# Patient Record
Sex: Male | Born: 1995 | Race: White | Hispanic: No | Marital: Single | Smoking: Never smoker
Health system: Southern US, Community
[De-identification: ages and names within clinical notes are randomized; demographics above are authoritative.]

## PROBLEM LIST (undated history)

## (undated) DIAGNOSIS — S069X9A Unspecified intracranial injury with loss of consciousness of unspecified duration, initial encounter: Secondary | ICD-10-CM

## (undated) DIAGNOSIS — G40909 Epilepsy, unspecified, not intractable, without status epilepticus: Secondary | ICD-10-CM

## (undated) DIAGNOSIS — S069XAA Unspecified intracranial injury with loss of consciousness status unknown, initial encounter: Secondary | ICD-10-CM

## (undated) HISTORY — PX: GASTROSTOMY TUBE PLACEMENT: SHX655

---

## 2020-09-15 ENCOUNTER — Other Ambulatory Visit: Payer: Self-pay

## 2020-09-15 ENCOUNTER — Emergency Department: Payer: Medicaid Other

## 2020-09-15 ENCOUNTER — Emergency Department
Admission: EM | Admit: 2020-09-15 | Discharge: 2020-09-15 | Disposition: A | Discharge status: Home / Self Care | Payer: Medicaid Other | Attending: Emergency Medicine | Admitting: Emergency Medicine

## 2020-09-15 DIAGNOSIS — R569 Unspecified convulsions: Secondary | ICD-10-CM | POA: Diagnosis present

## 2020-09-15 LAB — CBC WITH DIFFERENTIAL/PLATELET
Abs Immature Granulocytes: 0.04 10*3/uL (ref 0.00–0.07)
Basophils Absolute: 0 10*3/uL (ref 0.0–0.1)
Basophils Relative: 0 %
Eosinophils Absolute: 0.1 10*3/uL (ref 0.0–0.5)
Eosinophils Relative: 1 %
HCT: 46.6 % (ref 39.0–52.0)
Hemoglobin: 15.3 g/dL (ref 13.0–17.0)
Immature Granulocytes: 0 %
Lymphocytes Relative: 12 %
Lymphs Abs: 1.1 10*3/uL (ref 0.7–4.0)
MCH: 29.9 pg (ref 26.0–34.0)
MCHC: 32.8 g/dL (ref 30.0–36.0)
MCV: 91.2 fL (ref 80.0–100.0)
Monocytes Absolute: 0.5 10*3/uL (ref 0.1–1.0)
Monocytes Relative: 6 %
Neutro Abs: 7.4 10*3/uL (ref 1.7–7.7)
Neutrophils Relative %: 81 %
Platelets: 402 10*3/uL — ABNORMAL HIGH (ref 150–400)
RBC: 5.11 MIL/uL (ref 4.22–5.81)
RDW: 12.2 % (ref 11.5–15.5)
WBC: 9.2 10*3/uL (ref 4.0–10.5)
nRBC: 0 % (ref 0.0–0.2)

## 2020-09-15 LAB — COMPREHENSIVE METABOLIC PANEL
ALT: 25 U/L (ref 0–44)
AST: 26 U/L (ref 15–41)
Albumin: 4.2 g/dL (ref 3.5–5.0)
Alkaline Phosphatase: 124 U/L (ref 38–126)
Anion gap: 14 (ref 5–15)
BUN: 12 mg/dL (ref 6–20)
CO2: 23 mmol/L (ref 22–32)
Calcium: 9.5 mg/dL (ref 8.9–10.3)
Chloride: 101 mmol/L (ref 98–111)
Creatinine, Ser: 0.94 mg/dL (ref 0.61–1.24)
GFR, Estimated: >60 mL/min (ref >60)
Glucose, Bld: 148 mg/dL — ABNORMAL HIGH (ref 70–99)
Potassium: 3.8 mmol/L (ref 3.5–5.1)
Sodium: 138 mmol/L (ref 135–145)
Total Bilirubin: 1 mg/dL (ref 0.3–1.2)
Total Protein: 7.2 g/dL (ref 6.5–8.1)

## 2020-09-15 LAB — URINALYSIS, COMPLETE (UACMP) WITH MICROSCOPIC
Bacteria, UA: NONE SEEN
Bilirubin Urine: NEGATIVE
Glucose, UA: NEGATIVE mg/dL
Hgb urine dipstick: NEGATIVE
Ketones, ur: NEGATIVE mg/dL
Leukocytes,Ua: NEGATIVE
Nitrite: NEGATIVE
Protein, ur: NEGATIVE mg/dL
Specific Gravity, Urine: 1.019 (ref 1.005–1.030)
Squamous Epithelial / HPF: NONE SEEN (ref 0–5)
pH: 6 (ref 5.0–8.0)

## 2020-09-15 LAB — APTT: aPTT: 29 seconds (ref 24–36)

## 2020-09-15 LAB — PROTIME-INR
INR: 1 (ref 0.8–1.2)
Prothrombin Time: 13.1 seconds (ref 11.4–15.2)

## 2020-09-15 LAB — LACTIC ACID, PLASMA
Lactic Acid, Venous: 5.9 mmol/L (ref 0.5–1.9)
Lactic Acid, Venous: 1.4 mmol/L (ref 0.5–1.9)

## 2020-09-15 MED ORDER — ISONIAZID 300 MG PO TABS: 300.0000 mg | ORAL_TABLET | Freq: Once | ORAL | Status: DC

## 2020-09-15 MED ORDER — LEVETIRACETAM IN NACL 1000 MG/100ML IV SOLN
1000.0000 mg | Freq: Once | INTRAVENOUS | Status: AC
  Administered 2020-09-15: 1000 mg via INTRAVENOUS
  Filled 2020-09-15: qty 100

## 2020-09-15 MED ORDER — SODIUM CHLORIDE 0.9 % IV BOLUS (SEPSIS)
1000.0000 mL | Freq: Once | INTRAVENOUS | Status: AC
  Administered 2020-09-15: 1000 mL via INTRAVENOUS

## 2020-09-15 MED ORDER — RIFAMPIN 300 MG PO CAPS: 600.0000 mg | ORAL_CAPSULE | Freq: Once | ORAL | Status: DC

## 2020-09-15 MED ORDER — PYRAZINAMIDE 500 MG PO TABS: 1000.0000 mg | ORAL_TABLET | Freq: Once | ORAL | Status: DC

## 2020-09-15 MED ORDER — IOHEXOL 300 MG/ML  SOLN
75.0000 mL | Freq: Once | INTRAMUSCULAR | Status: AC | PRN
  Administered 2020-09-15: 75 mL via INTRAVENOUS

## 2020-09-15 MED ORDER — ETHAMBUTOL HCL 400 MG PO TABS: 800.0000 mg | ORAL_TABLET | Freq: Once | ORAL | Status: DC

## 2020-09-15 MED ORDER — ACETAMINOPHEN 650 MG RE SUPP
650.0000 mg | Freq: Once | RECTAL | Status: AC
  Administered 2020-09-15: 650 mg via RECTAL
  Filled 2020-09-15: qty 1

## 2020-09-15 NOTE — ED Notes (Signed)
This RN spoke with [facility] to notify of pt discharge and POC

## 2020-09-15 NOTE — ED Triage Notes (Signed)
Pt presents to ER via [facility] for seizures.  Pt has hx of TBI, but no hx of seizures.  Pt was reportedly found by staff at facility having twitching movements and not responding.  Pt unable to answer any questions in triage.  Unknown what baseline mental status is.

## 2020-09-15 NOTE — ED Provider Notes (Signed)
[facility] Emergency Department Provider Note   ____________________________________________   First MD Initiated Contact with Patient 09/15/20 0127     (approximate)  I have reviewed the triage vital signs and the nursing notes.   HISTORY  Chief Complaint No chief complaint on file.    HPI Alex [name] is a 24 y.o. male with a past medical history of traumatic brain injury and unknown baseline mental status who presents from his long-term care facility with complaints of seizure-like activity.  Per EMS staff at the facility found patient having twitching movements and not responding.  Patient was unable to answer any questions during the triage process and is not responding to this interview at this time.         History reviewed. No pertinent past medical history.  There are no problems to display for this patient.     Prior to Admission medications   Not on File    Allergies Patient has no known allergies.  No family history on file.  Social History Social History   Tobacco Use  . Smoking status: Not on file  Substance Use Topics  . Alcohol use: Not on file  . Drug use: Not on file    Review of Systems Unable to assess ____________________________________________   PHYSICAL EXAM:  VITAL SIGNS: ED Triage Vitals  Enc Vitals Group     BP 09/15/20 0134 127/86     Pulse Rate 09/15/20 0134 (!) 145     Resp 09/15/20 0134 20     Temp 09/15/20 0134 100.3 F (37.9 C)     Temp Source 09/15/20 0134 Axillary     SpO2 09/15/20 0134 91 %     Weight 09/15/20 0136 122 lb (55.3 kg)     Height --      Head Circumference --      Peak Flow --      Pain Score --      Pain Loc --      Pain Edu? --      Excl. in GC? --    Constitutional: Awake.  Cachectic. Eyes: Conjunctivae injected bilaterally. PERRL.  Horizontal nystagmus Head: Atraumatic. Nose: No congestion/rhinnorhea. Mouth/Throat: Mucous membranes are moist.  No  intraoral trauma Neck: No stridor Cardiovascular: Grossly normal heart sounds.  Good peripheral circulation. Respiratory: Normal respiratory effort.  No retractions. Gastrointestinal: Soft and nontender. No distention.  G-tube in place to left upper quadrant Musculoskeletal: No obvious deformities Neurologic: GCS 7 ([id number]), unknown baseline.  Protecting airway Skin:  Skin is warm and dry. No rash noted. Psychiatric: Mood and affect are normal. Speech and behavior are normal.  ____________________________________________   LABS (all labs ordered are listed, but only abnormal results are displayed)  Labs Reviewed  LACTIC ACID, PLASMA - Abnormal; Notable for the following components:      Result Value   Lactic Acid, Venous 5.9 (*)    All other components within normal limits  COMPREHENSIVE METABOLIC PANEL - Abnormal; Notable for the following components:   Glucose, Bld 148 (*)    All other components within normal limits  CBC WITH DIFFERENTIAL/PLATELET - Abnormal; Notable for the following components:   Platelets 402 (*)    All other components within normal limits  URINALYSIS, COMPLETE (UACMP) WITH MICROSCOPIC - Abnormal; Notable for the following components:   Color, Urine YELLOW (*)    APPearance CLOUDY (*)    All other components within normal limits  URINE CULTURE  CULTURE, BLOOD (ROUTINE  X 2)  CULTURE, BLOOD (ROUTINE X 2)  LACTIC ACID, PLASMA  PROTIME-INR  APTT   ____________________________________________  EKG  ED ECG REPORT I, Merwyn Katos, the attending physician, personally viewed and interpreted this ECG.  Date: 09/15/2020 EKG Time: 0137 Rate: 144 Rhythm: Tachycardic sinus rhythm QRS Axis: normal Intervals: normal ST/T Wave abnormalities: normal Narrative Interpretation: no evidence of acute ischemia  ____________________________________________  RADIOLOGY  ED MD interpretation: Single view portable x-ray of the chest does not show any evidence of  acute abnormalities including no obvious pneumonia, pneumothorax, or widened mediastinum  Official radiology report(s): CT Head Wo Contrast  Result Date: 09/15/2020 CLINICAL DATA:  Nontraumatic seizures. History of traumatic brain injury. EXAM: CT HEAD WITHOUT CONTRAST TECHNIQUE: Contiguous axial images were obtained from the base of the skull through the vertex without intravenous contrast. COMPARISON:  None. FINDINGS: Brain: Large areas of encephalomalacia centered in the temporal lobes bilaterally and extending to the inferior frontal and parietal lobes. Moderate ventricular dilatation. Parents is are unchanged since prior study. No mass-effect or midline shift. No abnormal extra-axial fluid collections. Basal cisterns are not effaced. No evidence of acute intracranial hemorrhage. Vascular: No hyperdense vessel or unexpected calcification. Skull: Normal. Negative for fracture or focal lesion. Sinuses/Orbits: Mucosal thickening throughout the paranasal sinuses. No acute air-fluid levels. Mastoid air cells are clear. Other: None. IMPRESSION: 1. No acute intracranial abnormalities. 2. Bilateral temporal and frontoparietal encephalomalacia, unchanged since prior study. 3. Moderate ventricular dilatation is unchanged since prior study. This may be due to central atrophy or normal pressure hydrocephalus. 4. Chronic sinusitis. Electronically Signed   By: Burman Nieves M.D.   On: 09/15/2020 05:46   CT Chest W Contrast  Result Date: 09/15/2020 CLINICAL DATA:  Seizures. EXAM: CT CHEST WITH CONTRAST TECHNIQUE: Multidetector CT imaging of the chest was performed during intravenous contrast administration. CONTRAST:  19mL OMNIPAQUE IOHEXOL 300 MG/ML  SOLN COMPARISON:  Chest radiograph 09/15/2020 FINDINGS: Cardiovascular: Normal heart size. No pericardial effusions. Normal caliber thoracic aorta. No dissection. Great vessel origins are patent. Central pulmonary arteries demonstrate no significant pulmonary  embolus. Mediastinum/Nodes: No enlarged mediastinal, hilar, or axillary lymph nodes. Thyroid gland, trachea, and esophagus demonstrate no significant findings. Lungs/Pleura: Lungs are clear. No pleural effusion or pneumothorax. Upper Abdomen: Gastrostomy tube is present. No acute process demonstrated in the visualized upper abdomen. Musculoskeletal: No chest wall abnormality. No acute or significant osseous findings. IMPRESSION: 1. No evidence of active pulmonary disease. 2. Gastrostomy tube is present. Electronically Signed   By: Burman Nieves M.D.   On: 09/15/2020 06:42   DG Chest Port 1 View  Result Date: 09/15/2020 CLINICAL DATA:  Seizures. EXAM: PORTABLE CHEST 1 VIEW COMPARISON:  None. FINDINGS: The heart size and mediastinal contours are within normal limits. Both lungs are clear. The visualized skeletal structures are unremarkable. IMPRESSION: No active disease. Electronically Signed   By: Burman Nieves M.D.   On: 09/15/2020 01:59    ____________________________________________   PROCEDURES  Procedure(s) performed (including Critical Care):  .1-3 Lead EKG Interpretation Performed by: Merwyn Katos, MD Authorized by: Merwyn Katos, MD     Interpretation: normal     ECG rate:  110   ECG rate assessment: tachycardic     Rhythm: sinus tachycardia     Ectopy: none     Conduction: normal       ____________________________________________   INITIAL IMPRESSION / ASSESSMENT AND PLAN / ED COURSE  As part of my medical decision making, I reviewed the following data  within the electronic MEDICAL RECORD NUMBER Nursing notes reviewed and incorporated, Labs reviewed, EKG interpreted, Old chart reviewed, Radiograph reviewed and Notes from prior ED visits reviewed and incorporated        Patient presents after recent seizure episode.  Patient had slow return to baseline mental and physical function per EMS. No immunosuppresion hx and had no preceding fever. No history of alcohol  abuse or suspicion for toxin ingestion. Unlikely stroke, syncope. Unlikely infectious etiology. No preceding trauma.  Workup: EKG, BMP, POCT glucose (pregnancy test if male) and CT Brain.  Field Interventions: 5 mg Valium ED Interventions: 1 g Keppra Disposition: Discharge home with primary care and neurology follow up in next 24-48 hours.      ____________________________________________   FINAL CLINICAL IMPRESSION(S) / ED DIAGNOSES  Final diagnoses:  Witnessed seizure-like activity [facility])     ED Discharge Orders    None       Note:  This document was prepared using Dragon voice recognition software and may include unintentional dictation errors.   Merwyn Katos, MD 09/15/20 [phone]

## 2020-09-16 LAB — URINE CULTURE: Culture: NO GROWTH

## 2020-09-20 LAB — CULTURE, BLOOD (ROUTINE X 2)
Culture: NO GROWTH
Culture: NO GROWTH
Special Requests: ADEQUATE

## 2020-10-22 ENCOUNTER — Emergency Department
Admission: EM | Admit: 2020-10-22 | Discharge: 2020-10-22 | Disposition: A | Discharge status: Home / Self Care | Payer: Medicaid Other | Attending: Student in an Organized Health Care Education/Training Program | Admitting: Student in an Organized Health Care Education/Training Program

## 2020-10-22 ENCOUNTER — Emergency Department: Payer: Medicaid Other

## 2020-10-22 DIAGNOSIS — W06XXXA Fall from bed, initial encounter: Secondary | ICD-10-CM | POA: Diagnosis not present

## 2020-10-22 DIAGNOSIS — Z23 Encounter for immunization: Secondary | ICD-10-CM | POA: Diagnosis not present

## 2020-10-22 DIAGNOSIS — S01112A Laceration without foreign body of left eyelid and periocular area, initial encounter: Secondary | ICD-10-CM | POA: Diagnosis not present

## 2020-10-22 DIAGNOSIS — Y9389 Activity, other specified: Secondary | ICD-10-CM | POA: Diagnosis not present

## 2020-10-22 DIAGNOSIS — S0990XA Unspecified injury of head, initial encounter: Secondary | ICD-10-CM | POA: Diagnosis present

## 2020-10-22 DIAGNOSIS — Y92003 Bedroom of unspecified non-institutional (private) residence as the place of occurrence of the external cause: Secondary | ICD-10-CM | POA: Insufficient documentation

## 2020-10-22 MED ORDER — MIDAZOLAM HCL 2 MG/2ML IJ SOLN
2.0000 mg | Freq: Once | INTRAMUSCULAR | Status: AC
  Administered 2020-10-22: 2 mg via INTRAMUSCULAR
  Filled 2020-10-22: qty 2

## 2020-10-22 MED ORDER — TETANUS-DIPHTH-ACELL PERTUSSIS 5-2.5-18.5 LF-MCG/0.5 IM SUSY
0.5000 mL | PREFILLED_SYRINGE | Freq: Once | INTRAMUSCULAR | Status: AC
  Administered 2020-10-22: 0.5 mL via INTRAMUSCULAR
  Filled 2020-10-22: qty 0.5

## 2020-10-22 MED ORDER — FLUORESCEIN SODIUM 1 MG OP STRP
1.0000 | ORAL_STRIP | Freq: Once | OPHTHALMIC | Status: AC
  Administered 2020-10-22: 1 via OPHTHALMIC
  Filled 2020-10-22: qty 1

## 2020-10-22 MED ORDER — HALOPERIDOL LACTATE 5 MG/ML IJ SOLN: 5.0000 mg | Freq: Once | INTRAMUSCULAR | Status: DC

## 2020-10-22 NOTE — ED Notes (Signed)
Pt placed on 2 posey alarms and floor pads placed for patient safety.

## 2020-10-22 NOTE — ED Triage Notes (Signed)
EMS endorses that pt fell from bed in facilty. Pt was aggressive and combative with EMS and they were unable to get vs. Pt arrived alert and moving all ext. Pt hs pmh of TBI. Pt had Lac to L eye lid. Denies any other pain

## 2020-10-22 NOTE — ED Provider Notes (Signed)
[facility] Emergency Department Provider Note    Event Date/Time   First MD Initiated Contact with Patient 10/22/20 1344     (approximate)  I have reviewed the triage vital signs and the nursing notes.   HISTORY  Chief Complaint Laceration (L eye lac from fall)  Level V Caveat:  TBI  HPI Alex [name] is a 24 y.o. male history of TBI presenting from facility after falling out of his bed striking his left forehead suffering a laceration of the left eyelid. Patient provides no additional history. Saying "Fluck you bitch" when I try to examine the eye.      No past medical history on file. No family history on file.  There are no problems to display for this patient.     Prior to Admission medications   Medication Sig Start Date End Date Taking? Authorizing Provider  acetaminophen (TYLENOL) 325 MG tablet Take 650 mg by mouth every 4 (four) hours as needed for mild pain or moderate pain.   Yes [provider]  amantadine (SYMMETREL) 50 MG/5ML solution Take 100 mg by mouth 3 (three) times daily.   Yes [provider]  baclofen (LIORESAL) 10 MG tablet Take 10 mg by mouth 3 (three) times daily.   Yes [provider]  bisacodyl (DULCOLAX) 10 MG suppository Place 10 mg rectally daily as needed for moderate constipation or severe constipation.   Yes [provider]  cholecalciferol (VITAMIN D3) 25 MCG (1000 UNIT) tablet Take 1,000 Units by mouth daily.   Yes [provider]  gabapentin (NEURONTIN) 100 MG capsule Take 100 mg by mouth 3 (three) times daily. (take with 100mg  capsules to equal 400mg  total per dose)   Yes [provider]  gabapentin (NEURONTIN) 300 MG capsule Take 300 mg by mouth 3 (three) times daily. (take with 100mg  capsules to equal 400mg  total per dose)   Yes [provider]  levETIRAcetam (KEPPRA) 1000 MG tablet Take 1,000 mg by mouth 2 (two) times daily.   Yes [provider]  melatonin 3 MG TABS tablet Take 6 mg by mouth at bedtime.   Yes [provider]  propranolol (INDERAL) 10 MG tablet Take 10 mg by mouth 3 (three) times daily.   Yes [provider]  QUEtiapine (SEROQUEL) 100 MG tablet Take 100 mg by mouth at bedtime.   Yes [provider]    Allergies Patient has no known allergies.    Social History    Review of Systems Patient denies headaches, rhinorrhea, blurry vision, numbness, shortness of breath, chest pain, edema, cough, abdominal pain, nausea, vomiting, diarrhea, dysuria, fevers, rashes or hallucinations unless otherwise stated above in HPI. ____________________________________________   PHYSICAL EXAM:  VITAL SIGNS: Vitals:   10/22/20 1341  BP: 102/87  Pulse: 78  Temp: (!) 97.5 F (36.4 C)    Constitutional: Alert,   Eyes: Conjunctivae are normal. No injection, Head: small amount of left periorbital swelling with superficial well approximated laceration roughly 1.5 in length.   Nose: No congestion/rhinnorhea. Mouth/Throat: Mucous membranes are moist.   Neck: No stridor. Painless ROM.  Cardiovascular: Normal rate, regular rhythm. Grossly normal heart sounds.  Good peripheral circulation. Respiratory: Normal respiratory effort.  No retractions. Lungs CTAB. Gastrointestinal: Soft and nontender. No distention. No abdominal bruits. No CVA tenderness. Genitourinary:  Musculoskeletal: No lower extremity tenderness nor edema.  No joint effusions. Neurologic: swatting at staff trying to examine him Skin:  Skin is warm, dry and intact.  No rash noted. Psychiatric: agitated, uncooperative with exam ____________________________________________   LABS (all labs ordered are listed, but only abnormal results are displayed)  No results found for this or any previous visit (from the past 24  hour(s)). ____________________________________________ ____________________________________________  RADIOLOGY  I personally reviewed all radiographic images ordered to evaluate for the above acute complaints and reviewed radiology reports and findings.  These findings were personally discussed with the patient.  Please see medical record for radiology report.  ____________________________________________   PROCEDURES  Procedure(s) performed:  Alex [name] KitchenMarland KitchenLaceration Repair  Date/Time: 10/22/2020 3:14 PM Performed by: Alex Eddy, MD Authorized by: Alex Eddy, MD   Consent:    Consent obtained:  Verbal   Consent given by:  Patient   Risks discussed:  Infection, pain, retained foreign body, poor cosmetic result and poor wound healing Anesthesia:    Anesthesia method:  None Laceration details:    Location:  Face   Face location:  L upper eyelid   Extent:  Partial thickness   Length (cm):  1.5   Depth (mm):  3 Exploration:    Hemostasis achieved with:  Direct pressure   Imaging obtained comment:  Ct   Contaminated: no   Treatment:    Area cleansed with:  Saline and povidone-iodine   Amount of cleaning:  Standard   Irrigation solution:  Sterile saline   Visualized foreign bodies/material removed: no     Debridement:  None Skin repair:    Repair method:  Tissue adhesive Approximation:    Approximation:  Close Repair type:    Repair type:  Simple Post-procedure details:    Procedure completion:  Tolerated well, no immediate complications      Critical Care performed: no ____________________________________________   INITIAL IMPRESSION / ASSESSMENT AND PLAN / ED COURSE  Pertinent labs & imaging results that were available during my care of the patient were reviewed by me and considered in my medical decision making (see chart for details).   DDX: laceration, contusion, fracture, sdh, iph  Alex [name] is a 24 y.o. who presents to the ED with  presentation as described above.  Patient is AFVSS in ED. Exam as above. Given current presentation have considered the above differential.  Exam with laceration.  CT head ordered to eval for above differential.    Clinical Course as of 10/22/20 1516  Fri Oct 22, 2020  1508 Patient's mother at bedside.  Woods lamp exam without any evidence of corneal abrasion.  No foreign bodies.  Laceration repaired with Dermabond.  Patient required some IM Versed to allow him to cooperate with wound approximation wound care.  Mother was agreeable to plan.  Patient stable and appropriate for outpatient follow-up. [PR]    Clinical Course User Index [PR] Alex Eddy, MD    The patient was evaluated in Emergency Department today for the symptoms described in the history of present illness. He/she was evaluated in the context of the global COVID-19 pandemic, which necessitated consideration that the patient might be at risk for infection with the SARS-CoV-2 virus that causes COVID-19. Institutional protocols and algorithms that pertain to the evaluation of patients at risk for COVID-19 are in a state of rapid change based on information released by regulatory bodies including the CDC and federal and state organizations. These policies and algorithms were followed during the patient's care in the ED.  As part of my medical decision making, I reviewed the following data within the electronic MEDICAL RECORD NUMBER Nursing notes reviewed and incorporated, Labs  reviewed, notes from prior ED visits and [place] Controlled Substance Database   ____________________________________________   FINAL CLINICAL IMPRESSION(S) / ED DIAGNOSES  Final diagnoses:  Injury of head, initial encounter  Left eyelid laceration, initial encounter      NEW MEDICATIONS STARTED DURING THIS VISIT:  New Prescriptions   No medications on file     Note:  This document was prepared using Dragon voice recognition software and may include  unintentional dictation errors.    Alex Eddy, MD 10/22/20 [phone]

## 2020-10-22 NOTE — ED Notes (Signed)
MD at bedside with surgiglue for eye lac.

## 2021-01-13 ENCOUNTER — Emergency Department: Payer: Medicaid Other

## 2021-01-13 ENCOUNTER — Emergency Department
Admission: EM | Admit: 2021-01-13 | Discharge: 2021-01-13 | Payer: Medicaid Other | Attending: Emergency Medicine | Admitting: Emergency Medicine

## 2021-01-13 ENCOUNTER — Other Ambulatory Visit: Payer: Self-pay

## 2021-01-13 ENCOUNTER — Encounter: Payer: Self-pay | Admitting: Emergency Medicine

## 2021-01-13 DIAGNOSIS — S92352A Displaced fracture of fifth metatarsal bone, left foot, initial encounter for closed fracture: Secondary | ICD-10-CM | POA: Insufficient documentation

## 2021-01-13 DIAGNOSIS — S062X9D Diffuse traumatic brain injury with loss of consciousness of unspecified duration, subsequent encounter: Secondary | ICD-10-CM | POA: Diagnosis not present

## 2021-01-13 DIAGNOSIS — Z20822 Contact with and (suspected) exposure to covid-19: Secondary | ICD-10-CM | POA: Diagnosis not present

## 2021-01-13 DIAGNOSIS — S99922A Unspecified injury of left foot, initial encounter: Secondary | ICD-10-CM

## 2021-01-13 DIAGNOSIS — R509 Fever, unspecified: Secondary | ICD-10-CM

## 2021-01-13 DIAGNOSIS — G40501 Epileptic seizures related to external causes, not intractable, with status epilepticus: Secondary | ICD-10-CM | POA: Insufficient documentation

## 2021-01-13 DIAGNOSIS — X58XXXA Exposure to other specified factors, initial encounter: Secondary | ICD-10-CM | POA: Diagnosis not present

## 2021-01-13 DIAGNOSIS — G40901 Epilepsy, unspecified, not intractable, with status epilepticus: Secondary | ICD-10-CM

## 2021-01-13 DIAGNOSIS — S069X9S Unspecified intracranial injury with loss of consciousness of unspecified duration, sequela: Secondary | ICD-10-CM

## 2021-01-13 HISTORY — DX: Epilepsy, unspecified, not intractable, without status epilepticus: G40.909

## 2021-01-13 HISTORY — DX: Unspecified intracranial injury with loss of consciousness of unspecified duration, initial encounter: S06.9X9A

## 2021-01-13 HISTORY — DX: Unspecified intracranial injury with loss of consciousness status unknown, initial encounter: S06.9XAA

## 2021-01-13 LAB — URINALYSIS, COMPLETE (UACMP) WITH MICROSCOPIC
Bacteria, UA: NONE SEEN
Bilirubin Urine: NEGATIVE
Glucose, UA: NEGATIVE mg/dL
Hgb urine dipstick: NEGATIVE
Ketones, ur: NEGATIVE mg/dL
Leukocytes,Ua: NEGATIVE
Nitrite: NEGATIVE
Protein, ur: NEGATIVE mg/dL
Specific Gravity, Urine: 1.028 (ref 1.005–1.030)
pH: 6 (ref 5.0–8.0)

## 2021-01-13 LAB — CBC WITH DIFFERENTIAL/PLATELET
Abs Immature Granulocytes: 0.03 10*3/uL (ref 0.00–0.07)
Basophils Absolute: 0.1 10*3/uL (ref 0.0–0.1)
Basophils Relative: 1 %
Eosinophils Absolute: 0 10*3/uL (ref 0.0–0.5)
Eosinophils Relative: 0 %
HCT: 42.9 % (ref 39.0–52.0)
Hemoglobin: 14.4 g/dL (ref 13.0–17.0)
Immature Granulocytes: 0 %
Lymphocytes Relative: 13 %
Lymphs Abs: 1.2 10*3/uL (ref 0.7–4.0)
MCH: 31 pg (ref 26.0–34.0)
MCHC: 33.6 g/dL (ref 30.0–36.0)
MCV: 92.5 fL (ref 80.0–100.0)
Monocytes Absolute: 0.6 10*3/uL (ref 0.1–1.0)
Monocytes Relative: 7 %
Neutro Abs: 7.4 10*3/uL (ref 1.7–7.7)
Neutrophils Relative %: 79 %
Platelets: 372 10*3/uL (ref 150–400)
RBC: 4.64 MIL/uL (ref 4.22–5.81)
RDW: 12.2 % (ref 11.5–15.5)
WBC: 9.3 10*3/uL (ref 4.0–10.5)
nRBC: 0 % (ref 0.0–0.2)

## 2021-01-13 LAB — BLOOD GAS, ARTERIAL
Acid-base deficit: 2.8 mmol/L — ABNORMAL HIGH (ref 0.0–2.0)
Bicarbonate: 21.9 mmol/L (ref 20.0–28.0)
FIO2: 100
MECHVT: 380 mL
O2 Saturation: 99.9 %
PEEP: 5 cmH2O
Patient temperature: 37
RATE: 24 resp/min
pCO2 arterial: 37 mmHg (ref 32.0–48.0)
pH, Arterial: 7.38 (ref 7.350–7.450)
pO2, Arterial: 360 mmHg — ABNORMAL HIGH (ref 83.0–108.0)

## 2021-01-13 LAB — COMPREHENSIVE METABOLIC PANEL
ALT: 40 U/L (ref 0–44)
AST: 41 U/L (ref 15–41)
Albumin: 4.3 g/dL (ref 3.5–5.0)
Alkaline Phosphatase: 117 U/L (ref 38–126)
Anion gap: 17 — ABNORMAL HIGH (ref 5–15)
BUN: 17 mg/dL (ref 6–20)
CO2: 19 mmol/L — ABNORMAL LOW (ref 22–32)
Calcium: 9.3 mg/dL (ref 8.9–10.3)
Chloride: 102 mmol/L (ref 98–111)
Creatinine, Ser: 0.8 mg/dL (ref 0.61–1.24)
GFR, Estimated: >60 mL/min (ref >60)
Glucose, Bld: 150 mg/dL — ABNORMAL HIGH (ref 70–99)
Potassium: 3.6 mmol/L (ref 3.5–5.1)
Sodium: 138 mmol/L (ref 135–145)
Total Bilirubin: 1.7 mg/dL — ABNORMAL HIGH (ref 0.3–1.2)
Total Protein: 7.2 g/dL (ref 6.5–8.1)

## 2021-01-13 LAB — PROTIME-INR
INR: 1.1 (ref 0.8–1.2)
Prothrombin Time: 14 seconds (ref 11.4–15.2)

## 2021-01-13 LAB — RESP PANEL BY RT-PCR (FLU A&B, COVID) ARPGX2
Influenza A by PCR: NEGATIVE
Influenza B by PCR: NEGATIVE
SARS Coronavirus 2 by RT PCR: NEGATIVE

## 2021-01-13 LAB — LACTIC ACID, PLASMA
Lactic Acid, Venous: 3.8 mmol/L (ref 0.5–1.9)
Lactic Acid, Venous: 9.9 mmol/L (ref 0.5–1.9)

## 2021-01-13 LAB — TROPONIN I (HIGH SENSITIVITY): Troponin I (High Sensitivity): 8 ng/L (ref <18)

## 2021-01-13 LAB — PROCALCITONIN: Procalcitonin: <0.1 ng/mL

## 2021-01-13 LAB — CBG MONITORING, ED: Glucose-Capillary: 148 mg/dL — ABNORMAL HIGH (ref 70–99)

## 2021-01-13 LAB — CK: Total CK: 344 U/L (ref 49–397)

## 2021-01-13 LAB — APTT: aPTT: 31 seconds (ref 24–36)

## 2021-01-13 MED ORDER — FENTANYL BOLUS VIA INFUSION
50.0000 ug | INTRAVENOUS | Status: AC
  Administered 2021-01-13: 50 ug via INTRAVENOUS

## 2021-01-13 MED ORDER — ROCURONIUM BROMIDE 50 MG/5ML IV SOLN
INTRAVENOUS | Status: AC | PRN
  Administered 2021-01-13: 60 mg via INTRAVENOUS

## 2021-01-13 MED ORDER — LACTATED RINGERS IV BOLUS (SEPSIS)
500.0000 mL | Freq: Once | INTRAVENOUS | Status: AC
  Administered 2021-01-13: 500 mL via INTRAVENOUS

## 2021-01-13 MED ORDER — SODIUM CHLORIDE 0.9 % IV BOLUS
1000.0000 mL | Freq: Once | INTRAVENOUS | Status: AC
  Administered 2021-01-13: 1000 mL via INTRAVENOUS

## 2021-01-13 MED ORDER — SODIUM CHLORIDE 0.9 % IV SOLN
20.0000 mg/kg | Freq: Once | INTRAVENOUS | Status: AC
  Administered 2021-01-13: 1100 mg via INTRAVENOUS
  Filled 2021-01-13: qty 22

## 2021-01-13 MED ORDER — VANCOMYCIN HCL IN DEXTROSE 1-5 GM/200ML-% IV SOLN
1000.0000 mg | Freq: Once | INTRAVENOUS | Status: AC
  Administered 2021-01-13: 1000 mg via INTRAVENOUS
  Filled 2021-01-13: qty 200

## 2021-01-13 MED ORDER — SODIUM CHLORIDE 0.9 % IV SOLN
INTRAVENOUS | Status: AC | PRN
  Administered 2021-01-13: 1000 mL via INTRAVENOUS

## 2021-01-13 MED ORDER — LACTATED RINGERS IV BOLUS (SEPSIS): 250.0000 mL | Freq: Once | INTRAVENOUS | Status: DC

## 2021-01-13 MED ORDER — LACTATED RINGERS IV BOLUS (SEPSIS)
1000.0000 mL | Freq: Once | INTRAVENOUS | Status: AC
  Administered 2021-01-13: 1000 mL via INTRAVENOUS

## 2021-01-13 MED ORDER — PROPOFOL 10 MG/ML IV BOLUS
INTRAVENOUS | Status: AC | PRN
  Administered 2021-01-13: 60 mg via INTRAVENOUS

## 2021-01-13 MED ORDER — METRONIDAZOLE IN NACL 5-0.79 MG/ML-% IV SOLN
500.0000 mg | Freq: Once | INTRAVENOUS | Status: AC
  Administered 2021-01-13: 500 mg via INTRAVENOUS
  Filled 2021-01-13: qty 100

## 2021-01-13 MED ORDER — ACETAMINOPHEN 650 MG RE SUPP
650.0000 mg | Freq: Once | RECTAL | Status: AC
  Administered 2021-01-13: 650 mg via RECTAL
  Filled 2021-01-13: qty 1

## 2021-01-13 MED ORDER — PROPOFOL 1000 MG/100ML IV EMUL: 5.0000 ug/kg/min | INTRAVENOUS | Status: DC

## 2021-01-13 MED ORDER — PROPOFOL 1000 MG/100ML IV EMUL
INTRAVENOUS | Status: AC
  Filled 2021-01-13: qty 100

## 2021-01-13 MED ORDER — PROPOFOL 10 MG/ML IV BOLUS
INTRAVENOUS | Status: AC
  Administered 2021-01-13: 5 ug/kg/min via INTRAVENOUS
  Filled 2021-01-13: qty 20

## 2021-01-13 MED ORDER — FENTANYL 2500MCG IN NS 250ML (10MCG/ML) PREMIX INFUSION
0.0000 ug/h | INTRAVENOUS | Status: DC
  Administered 2021-01-13: 100 ug/h via INTRAVENOUS
  Filled 2021-01-13: qty 250

## 2021-01-13 MED ORDER — SODIUM CHLORIDE 0.9 % IV SOLN
2.0000 g | Freq: Once | INTRAVENOUS | Status: AC
  Administered 2021-01-13: 2 g via INTRAVENOUS
  Filled 2021-01-13: qty 2

## 2021-01-13 MED ORDER — LORAZEPAM 2 MG/ML IJ SOLN
INTRAMUSCULAR | Status: AC
  Filled 2021-01-13: qty 1

## 2021-01-13 NOTE — ED Notes (Addendum)
Pt localizing ETT with both hands and turning head side to side.

## 2021-01-13 NOTE — ED Notes (Signed)
Pt resting at this time, tolerating vent well. HOB elevated to 30degress, knees elevated for comfort. Seizure precautions remain in place.

## 2021-01-13 NOTE — ED Notes (Signed)
ED Provider at bedside. 

## 2021-01-13 NOTE — ED Notes (Signed)
Transport crew at bedside

## 2021-01-13 NOTE — ED Provider Notes (Signed)
[facility] Emergency Department Provider Note  ____________________________________________   Event Date/Time   First MD Initiated Contact with Patient 01/13/21 0008     (approximate)  I have reviewed the triage vital signs and the nursing notes.   HISTORY No Chief Complaint Seizures  Level 5 caveat:  history/ROS limited by acute/critical illness  HPI Alex [name] is a 25 y.o. male with minimal known history except for history of TBI and seizure disorder who presents by EMS his emergency traffic for prolonged seizures.  He is a resident at [facility] and reportedly staff checked on him to find that he was having a generalized seizure.  The seizure went on for 15 minutes witnessed until EMS arrived.  They provided midazolam 2 mg IV and he briefly stopped but then resumed seizing so they provided another midazolam 2 mg IV.  At that point he stopped the seizure activity and he was transported to the ED.  No other recent history or issues were provided to EMS by the [facility] health staff.  No MAR was provided.  He had a fingerstick blood sugar greater than 300 for EMS and a heart rate in the 140s.  He was not hypoxemic for them although he was 88% upon arrival on room air for [place].         Past Medical History:  Diagnosis Date  . Seizure disorder (HCC)   . TBI (traumatic brain injury) (HCC)     There are no problems to display for this patient.   Past Surgical History:  Procedure Laterality Date  . GASTROSTOMY TUBE PLACEMENT      Prior to Admission medications   Medication Sig Start Date End Date Taking? Authorizing Provider  acetaminophen (TYLENOL) 325 MG tablet Take 650 mg by mouth every 4 (four) hours as needed for mild pain or moderate pain.    [provider]  amantadine (SYMMETREL) 50 MG/5ML solution Take 100 mg by mouth 3 (three) times daily.    [provider]  baclofen (LIORESAL) 10 MG tablet Take 10 mg by  mouth 3 (three) times daily.    [provider]  bisacodyl (DULCOLAX) 10 MG suppository Place 10 mg rectally daily as needed for moderate constipation or severe constipation.    [provider]  cholecalciferol (VITAMIN D3) 25 MCG (1000 UNIT) tablet Take 1,000 Units by mouth daily.    [provider]  gabapentin (NEURONTIN) 100 MG capsule Take 100 mg by mouth 3 (three) times daily. (take with 100mg  capsules to equal 400mg  total per dose)    [provider]  gabapentin (NEURONTIN) 300 MG capsule Take 300 mg by mouth 3 (three) times daily. (take with 100mg  capsules to equal 400mg  total per dose)    [provider]  levETIRAcetam (KEPPRA) 1000 MG tablet Take 1,000 mg by mouth 2 (two) times daily.    [provider]  melatonin 3 MG TABS tablet Take 6 mg by mouth at bedtime.    [provider]  propranolol (INDERAL) 10 MG tablet Take 10 mg by mouth 3 (three) times daily.    [provider]  QUEtiapine (SEROQUEL) 100 MG tablet Take 100 mg by mouth at bedtime.    [provider]    Allergies Patient has no known allergies.  History reviewed. No pertinent family history.  Social History Social History   Tobacco Use  . Smoking status: Never Smoker  . Smokeless tobacco: Never Used    Review of  Systems Level 5 caveat:  history/ROS limited by acute/critical illness   ____________________________________________   PHYSICAL EXAM:  VITAL SIGNS: ED Triage Vitals  Enc Vitals Group     BP 01/13/21 0012 90/60     Pulse Rate 01/13/21 0009 (!) 142     Resp 01/13/21 0009 20     Temp 01/13/21 0009 (!) 101.4 F (38.6 C)     Temp Source 01/13/21 0009 Rectal     SpO2 01/13/21 0008 (!) 88 %     Weight 01/13/21 0013 55 kg (121 lb 4.1 oz)     Height --      Head Circumference --      Peak Flow --      Pain Score --      Pain Loc --      Pain Edu? --      Excl. in GC? --     Constitutional: Unresponsive, blinking  eyes rapidly with some rapid eye movements side to side.  Chronic ill appearance. Eyes: Conjunctivae are normal.  Pupils are dilated, rapid eye movements and rapid blinking. Head: Atraumatic. Nose: No congestion/rhinnorhea. Mouth/Throat: Mucous membranes are dry.  Well-healed tracheostomy scar on anterior neck. Neck: No stridor.  No meningeal signs.   Cardiovascular: Tachycardia, regular rhythm. Good peripheral circulation. Respiratory: Patient breathing on his own, no gross abnormalities identified on auscultation. Gastrointestinal: Soft and nondistended.  No response to palpation.  Percutaneous gastric tube is present. Genitourinary: Normal-appearing external circumcised male genitalia. Musculoskeletal: Patient has contractures, unclear if this is baseline.  No obvious infections or injuries. Neurologic:  GCS 6 (Eye 4, Verbal 1, Motor 1).  Patient also having rapid eye movements concerning for persistent seizure activity. Skin:  Skin is abnormally warm to the touch, dry.   ____________________________________________   LABS (all labs ordered are listed, but only abnormal results are displayed)  Labs Reviewed  LACTIC ACID, PLASMA - Abnormal; Notable for the following components:      Result Value   Lactic Acid, Venous 9.9 (*)    All other components within normal limits  LACTIC ACID, PLASMA - Abnormal; Notable for the following components:   Lactic Acid, Venous 3.8 (*)    All other components within normal limits  COMPREHENSIVE METABOLIC PANEL - Abnormal; Notable for the following components:   CO2 19 (*)    Glucose, Bld 150 (*)    Total Bilirubin 1.7 (*)    Anion gap 17 (*)    All other components within normal limits  URINALYSIS, COMPLETE (UACMP) WITH MICROSCOPIC - Abnormal; Notable for the following components:   Color, Urine YELLOW (*)    APPearance HAZY (*)    All other components within normal limits  BLOOD GAS, ARTERIAL - Abnormal; Notable for the following components:    pO2, Arterial 360 (*)    Acid-base deficit 2.8 (*)    All other components within normal limits  CBG MONITORING, ED - Abnormal; Notable for the following components:   Glucose-Capillary 148 (*)    All other components within normal limits  RESP PANEL BY RT-PCR (FLU A&B, COVID) ARPGX2  CULTURE, BLOOD (ROUTINE X 2)  CULTURE, BLOOD (ROUTINE X 2)  URINE CULTURE  CBC WITH DIFFERENTIAL/PLATELET  PROTIME-INR  APTT  PROCALCITONIN  CK  TROPONIN I (HIGH SENSITIVITY)   ____________________________________________  EKG  ED ECG REPORT I, Loleta Roseory Vlada Uriostegui, the attending physician, personally viewed and interpreted this ECG.  Date: 01/13/2021 EKG Time: 00: 09 Rate: 151 Rhythm: Sinus tachycardia QRS Axis: normal Intervals: Prolonged  QTC at 584 ms ST/T Wave abnormalities: Non-specific ST segment / T-wave changes, possible rate related ischemia but doubt acute cardiac event. Narrative Interpretation: no definitive evidence of acute ischemia; does not meet STEMI criteria.   ____________________________________________  RADIOLOGY I, Loleta Rose, personally viewed and evaluated these images (plain radiographs) as part of my medical decision making, as well as reviewing the written report by the radiologist.  ED MD interpretation: No acute findings on head CT with many chronic changes.  Chest x-ray shows appropriate placement of endotracheal tube.  No acute findings on abdominal x-ray.  Foot x-rays demonstrate fifth metatarsal fracture and possible first metatarsophalangeal dislocation.  Official radiology report(s): DG Abdomen 1 View  Result Date: 01/13/2021 CLINICAL DATA:  OG tube placement EXAM: ABDOMEN - 1 VIEW COMPARISON:  None. FINDINGS: The bowel gas pattern is normal. Tip the NG tube is seen within the proximal stomach. No radio-opaque calculi or other significant radiographic abnormality are seen. IMPRESSION: Tip of the OG tube within the proximal stomach. Electronically Signed   By:  Jonna Clark M.D.   On: 01/13/2021 01:02   CT Head Wo Contrast  Result Date: 01/13/2021 CLINICAL DATA:  Seizure EXAM: CT HEAD WITHOUT CONTRAST TECHNIQUE: Contiguous axial images were obtained from the base of the skull through the vertex without intravenous contrast. COMPARISON:  CT 10/22/2020 FINDINGS: Brain: Stable chronic regions of cortically based bilateral frontotemporal and more mild biparietal encephalomalacia additional hypoattenuation the deep right frontal and left frontoparietal white matter as well. Overall distribution of findings is unchanged from comparison exam. No new CT evident areas of large vascular territory or cortically based infarct. No hyperdense hemorrhage. Dilation of the ventricles with stable caliber from comparison study. No new mass effect or midline shift. No visible parenchymal lesions. Vascular: No hyperdense vessel or unexpected calcification. Skull: No calvarial fracture or suspicious osseous lesion. No scalp swelling or hematoma. Sinuses/Orbits: Mural thickening throughout the paranasal sinuses, may be related to recent instrumentation versus more chronic sinus disease. Included orbital structures are unremarkable. Other: None IMPRESSION: 1. Stable chronic regions of cortically based bilateral frontotemporal and more mild biparietal encephalomalacia. Additional hypoattenuating encephalomalacia within the deep right frontal and left frontoparietal white matter as well. Overall distribution of findings is unchanged from comparison exam. 2. Dilation of the ventricles with stable caliber from comparison study, possibly related to some ex vacuo change. 3. No discernible acute intracranial abnormality. 4. Mural thickening throughout the paranasal sinuses, may be related to recent instrumentation versus more chronic sinus disease. Electronically Signed   By: Kreg Shropshire M.D.   On: 01/13/2021 02:15   DG Chest Port 1 View  Result Date: 01/13/2021 CLINICAL DATA:  Question of  sepsis EXAM: PORTABLE CHEST 1 VIEW COMPARISON:  None. FINDINGS: The heart size and mediastinal contours are within normal limits. ETT is 1.7 cm above the carina. NG tube is seen coursing below the diaphragm. Both lungs are clear. The visualized skeletal structures are unremarkable. IMPRESSION: ETT 1.7 cm above the carina NG tube below the diaphragm Electronically Signed   By: Jonna Clark M.D.   On: 01/13/2021 01:01   DG Foot Complete Left  Result Date: 01/13/2021 CLINICAL DATA:  Deformity bruising, recent seizures EXAM: LEFT FOOT - COMPLETE 3+ VIEW COMPARISON:  None. FINDINGS: The osseous structures appear diffusely demineralized which may limit detection of small or nondisplaced fractures. Transversely oriented fracture lucency through the base of the fifth metatarsal with mild adjacent swelling. Marked flexion across all 5 rays. Slightly more conspicuous flexion across  the first and phalangeal joint, with some irregularity of the head of the first proximal phalanx, could reflect a small fracture dislocation difficult to assess given positioning. Soft tissue swelling of the first digit. No other discernible acute osseous abnormality or traumatic malalignment within limitations of this nonweightbearing exam. IMPRESSION: 1. Transversely oriented fracture through the base of the fifth metatarsal with mild adjacent swelling. 2. Slightly more conspicuous flexion across the first metatarsophalangeal joint with some irregularity of the head of the first proximal phalanx, could reflect a fracture dislocation, difficult to assess given positioning. 3. Diffuse bony demineralization. Electronically Signed   By: Kreg Shropshire M.D.   On: 01/13/2021 02:35    ____________________________________________   PROCEDURES   Procedure(s) performed (including Critical Care):  .1-3 Lead EKG Interpretation Performed by: Loleta Rose, MD Authorized by: Loleta Rose, MD     Interpretation: abnormal     ECG rate:  151    ECG rate assessment: tachycardic     Rhythm: sinus tachycardia     Ectopy: none     Conduction: normal   .Critical Care Performed by: Loleta Rose, MD Authorized by: Loleta Rose, MD   Critical care provider statement:    Critical care time (minutes):  80   Critical care time was exclusive of:  Separately billable procedures and treating other patients   Critical care was necessary to treat or prevent imminent or life-threatening deterioration of the following conditions:  Sepsis and CNS failure or compromise   Critical care was time spent personally by me on the following activities:  Development of treatment plan with patient or surrogate, discussions with consultants, evaluation of patient's response to treatment, examination of patient, obtaining history from patient or surrogate, ordering and performing treatments and interventions, ordering and review of laboratory studies, ordering and review of radiographic studies, pulse oximetry, re-evaluation of patient's condition and review of old charts Procedure Name: Intubation Date/Time: 01/13/2021 12:49 AM Performed by: Loleta Rose, MD Pre-anesthesia Checklist: Patient identified, Emergency Drugs available, Suction available and Patient being monitored Oxygen Delivery Method: Non-rebreather mask Preoxygenation: Pre-oxygenation with 100% oxygen Induction Type: IV induction and Rapid sequence Laryngoscope Size: Glidescope and 3 Tube size: 7.5 mm Number of attempts: 1 Placement Confirmation: ETT inserted through vocal cords under direct vision,  CO2 detector and Breath sounds checked- equal and bilateral Secured at: 25 cm Tube secured with: ETT holder Dental Injury: Teeth and Oropharynx as per pre-operative assessment         ____________________________________________   INITIAL IMPRESSION / MDM / ASSESSMENT AND PLAN / ED COURSE  As part of my medical decision making, I reviewed the following data within the electronic  MEDICAL RECORD NUMBER History obtained from family, Nursing notes reviewed and incorporated, Labs reviewed , EKG interpreted , Old chart reviewed, Radiograph reviewed , Discussed with admitting physician (Dr. Clelia Croft at [facility]) and reviewed Notes from prior ED visits   Differential diagnosis includes, but is not limited to, sepsis, urinary tract infection, pneumonia, other intrathoracic or abdominal infection, bacteremia, meningitis, electrolyte or metabolic abnormality including renal failure or hyperkalemia, medication or drug side effect.  The patient is on the cardiac monitor to evaluate for evidence of arrhythmia and/or significant heart rate changes.  Within a couple of minutes of arrival to the emergency department the patient went from rapid eye blinking and rapid eye movements once again having generalized tonic-clonic activity.  He meets criteria for status epilepticus in spite of receiving a total of 4 mg of IV midazolam.  At  this point I intubated him for his airway protection without difficulty or complication.  Given that he has received 4 mg of midazolam and was still seizing, I ordered fosphenytoin 20 mg/kg.  Patient meets criteria for septic shock as he is hypotensive, febrile, tachycardic.  Rectal temperature was 101.4 and the patient's blood pressure is around 80/60.  Code sepsis initiated including 30 mL/kg of lactated Ringer's which calculates to 1750 mL, but I am rounding up to 2 L; the patient also received a small amount of NS.  I anticipate he will need more fluids but we will reassess BP after the initial 2 L.  Unfortunately [place] [facility] did not send [place] his medical history and we will contact them and asked them to fax the information.  I have no report of allergies or medications that he is taking.  I will treat empirically for septic shock with the "unknown source" combination of vancomycin 1 g IV and per pharmacy consult, metronidazole 500 mg IV, and cefepime 2 g IV.        Clinical Course as of 01/13/21 0402  Thu Jan 13, 2021  0025 Minimal information is available in the system and only one sheet was sent from [place] [facility].  I have asked my Diplomatic Services operational officer to reach out to [facility] and see if they will send [place] his [facility] and medical history. [CF]  0105 The patient's mother called after [facility] contacted her.  Her name is Hilario Quarry and she identified herself as his legal guardian which is apparently what [facility] thought since they called her with the emergent transport.  Her information has been updated in the demographics section of the chart.  She confirmed his medical history that includes seizures, TBI in 2020, prior opioid addiction, and bipolar disorder.  She said that he is typically awake and alert and conversant.  He has been eating and drinking and not using his G-tube for quite some time.  He had some skin rash about a week ago and was started on some new medication for it but she is unaware of what it is or of any other different medications.  She is not aware of any medication allergies that he might have. [CF]  0108 The patient's mother/legal guardian also reports that if he needs transfer (for example, for continuous EEG monitoring), she prefers that he be transferred to [facility] because that was where he received care for an extended period of time after his initial brain injury. [CF]  0109 I personally reviewed the patient's imaging and agree with the radiologist's interpretation that there are no obvious or acute abnormalities on either the portable chest x-ray nor the abdomen x-ray.  The NG tube and OG tube are appropriately placed. [CF]  0109 Lactic Acid, Venous(!!): 9.9 Lactic acid elevated, unclear if due to septic shock but likely also contributed by seizure activity. [CF]  0109 Procalcitonin: <0.10 Somewhat surprisingly normal procalcitonin [CF]  0110 Comprehensive metabolic panel(!) Comprehensive  metabolic panel is essentially normal other than a decreased CO2 and elevated anion gap of 17 likely due to lactic acidosis [CF]  0110 CBC WITH DIFFERENTIAL CBC is normal with no leukocytosis [CF]  0111 Troponin I (High Sensitivity): 8 [CF]  0122 Patient's blood pressure is normalized with a systolic of about 130.  He has started to wake up a little bit and is having some movements that do not appear to be seizure-like but also not completely voluntary.  Again I am not certain  of his neurological baseline in terms of whether or not he has function of his left arm which appears contractured and it is the one that is moving.  Given the ongoing concern for seizure activity or status epilepticus that can be masked as a result of intubation status, I verified with the nursing supervisor and the ICU provider that continuous EEG is not possible.  As per the mother's request, I will contact [facility] for transfer.  I ordered a head CT to verify no acute intracranial abnormalities identifiable by imaging such as tumor or bleed. [CF]  0140 Blood gas, arterial(!) FiO2 lowered by RT to 30%. [CF]  1610 Patient localizing to the endotracheal tube with his hand.  He is now on 50 mcg/kg/min of propofol.  I am adding fentanyl 100 mcg/h infusion. [CF]  0141 SARS Coronavirus 2 by RT PCR: NEGATIVE [CF]  0144 For the patient's comfort and to allow for CT head, I ordered fentanyl 50 mcg IV bolus from the infusion that is just now getting started. [CF]  0144 BP: 130/79 [CF]  0144 Pulse Rate(!): 119 Heart rate is improved after fluids but he is still tachycardic [CF]  0213 Also ordered x-rays of the patient's left foot given the apparent injury to his great toe, but unclear if this is acute or subacute. [CF]  0213 CK Total: 344 No evidence of rhabdomyolysis [CF]  0227 Discussed case by phone with Dr. Clelia Croft with [facility] neurology.  She agreed with the plan for transfer and accepted the patient.  I was told by the [facility] logistics  center that a neuro ICU bed is available so we are awaiting bed assignment and then critical care transport [CF]  0235 I called and updated the patient's mother about the plan she is in agreement and will contact [facility] in the morning. [CF]  0235 Sepsis reassessment complete.  The patient's blood pressure is dropped again but I believe this is likely due to sedation, not sepsis, particularly given the very reassuring work-up he has thus far received.  We will continue to wean sedation and attempt to improve the blood pressure.  In the meantime I will also continue with the 3rd L of fluid (NS) because I believe he can tolerate the fluid without difficulty but at this point I think that a true septic shock is very unlikely. [CF]  0255 Repeat lactic acid is pending. [CF]  0258 Urinalysis, Complete w Microscopic Urine, Catheterized(!) No indication of UTI [CF]  0300 DG Foot Complete Left Reviewed x-rays of left foot, and patient has a fifth metatarsal fracture, possibly an injury to great toe as well.  Will [facility] care [facility] after transfer. [CF]  0334 Lactic Acid, Venous(!!): 3.8 Reassuring repeat lactic acid, has dropped more than 6 points after fluid administration.  Again, at this point, I strongly doubt true septic shock.  The patient's blood pressure is staying around 90 systolic but he is adequately sedated at this time and not showing any indication of pain.  We will continue with the fluid resuscitation.  Patient has a bed assignment at [facility] and the nurses giving report. [CF]    Clinical Course User Index [CF] Loleta Rose, MD     ____________________________________________  FINAL CLINICAL IMPRESSION(S) / ED DIAGNOSES  Final diagnoses:  Status epilepticus (HCC)  Traumatic brain injury with loss of consciousness, sequela (HCC)  Displaced fracture of fifth metatarsal bone, left foot, initial encounter for closed fracture  Injury of left great toe, initial encounter  Fever, unspecified fever  cause     MEDICATIONS GIVEN DURING THIS VISIT:  Medications  LORazepam (ATIVAN) 2 MG/ML injection (  Not Given 01/13/21 0054)  propofol (DIPRIVAN) 1000 MG/100ML infusion (20 mcg/kg/min  55 kg Intravenous Rate/Dose Change 01/13/21 0358)  fentaNYL in NS (74mcg/ml) infusion-PREMIX (125 mcg/hr Intravenous Rate/Dose Change 01/13/21 0356)  0.9 %  sodium chloride infusion ( Intravenous Stopped 01/13/21 0055)  fosPHENYtoin (CEREBYX) 1,100 mg PE in sodium chloride 0.9 % 50 mL IVPB (0 mg PE/kg  55 kg Intravenous Stopped 01/13/21 0108)  propofol (DIPRIVAN) 10 mg/mL bolus/IV push ( Intravenous Canceled Entry 01/13/21 0015)  rocuronium (ZEMURON) injection (60 mg Intravenous Given 01/13/21 0014)  lactated ringers bolus 1,000 mL (0 mLs Intravenous Stopped 01/13/21 0117)    And  lactated ringers bolus 500 mL (0 mLs Intravenous Stopped 01/13/21 0124)    And  lactated ringers bolus 500 mL (0 mLs Intravenous Stopped 01/13/21 0124)  ceFEPIme (MAXIPIME) 2 g in sodium chloride 0.9 % 100 mL IVPB (0 g Intravenous Stopped 01/13/21 0055)  metroNIDAZOLE (FLAGYL) IVPB 500 mg (0 mg Intravenous Stopped 01/13/21 0148)  vancomycin (VANCOCIN) IVPB 1000 mg/200 mL premix (0 mg Intravenous Stopped 01/13/21 0232)  acetaminophen (TYLENOL) suppository 650 mg (650 mg Rectal Given 01/13/21 0040)  fentaNYL (SUBLIMAZE) bolus via infusion 50 mcg (50 mcg Intravenous Bolus from Bag 01/13/21 0147)  sodium chloride 0.9 % bolus 1,000 mL (1,000 mLs Intravenous New Bag/Given 01/13/21 0348)     ED Discharge Orders    None      *Please note:  LORETO LOESCHER [name] was evaluated in Emergency Department on 01/13/2021 for the symptoms described in the history of present illness. He was evaluated in the context of the global COVID-19 pandemic, which necessitated consideration that the patient might be at risk for infection with the SARS-CoV-2 virus that causes COVID-19. Institutional protocols and algorithms that pertain to the evaluation of  patients at risk for COVID-19 are in a state of rapid change based on information released by regulatory bodies including the CDC and federal and state organizations. These policies and algorithms were followed during the patient's care in the ED.  Some ED evaluations and interventions may be delayed as a result of limited staffing during and after the pandemic.*  Note:  This document was prepared using Dragon voice recognition software and may include unintentional dictation errors.   Loleta Rose, MD 01/13/21 [id number]

## 2021-01-13 NOTE — Code Documentation (Signed)
EKG completed

## 2021-01-13 NOTE — ED Notes (Signed)
Dr. York Cerise notified mother Cala Bradford Hammons) and made aware of transfer to [facility]. Mother gave telephone consent for transport and transfer.

## 2021-01-13 NOTE — ED Notes (Addendum)
Pt localizing ETT with L hand and opened eyes. Withdrew R arm from ABG stick.   Safety mitts applied.

## 2021-01-13 NOTE — ED Notes (Signed)
Pt moving L arm.

## 2021-01-13 NOTE — ED Notes (Signed)
Xr at bedside

## 2021-01-13 NOTE — Progress Notes (Signed)
CODE SEPSIS - PHARMACY COMMUNICATION  **Broad Spectrum Antibiotics should be administered within 1 hour of Sepsis diagnosis**  Time Code Sepsis Called/Page Received: 0019  Antibiotics Ordered: Cefepime and Vancomycin  Time of 1st antibiotic administration:  0031  Otelia Sergeant, PharmD, [facility] 01/13/2021 12:25 AM

## 2021-01-13 NOTE — ED Notes (Addendum)
Patient transported to and from CT by RN and with RRT, on monitor.

## 2021-01-13 NOTE — Progress Notes (Signed)
PHARMACY -  BRIEF ANTIBIOTIC NOTE   Pharmacy has received consult(s) for Cefepime and Vancomycin from an ED provider.  The patient's profile has been reviewed for ht/wt/allergies/indication/available labs.    One time order(s) placed for Cefepime 2 gm and Vancomycin 1 gm based on pt wt 55 kg.  Further antibiotics/pharmacy consults should be ordered by admitting physician if indicated.                       Otelia Sergeant, PharmD, [facility] 01/13/2021 12:27 AM

## 2021-01-14 LAB — URINE CULTURE: Culture: NO GROWTH

## 2021-01-14 MED FILL — Norepinephrine Bitartrate IV Soln 1 MG/ML (Base Equivalent): INTRAVENOUS | Qty: 4 | Status: AC

## 2021-01-18 LAB — CULTURE, BLOOD (ROUTINE X 2)
Culture: NO GROWTH
Culture: NO GROWTH
Special Requests: ADEQUATE
Special Requests: ADEQUATE

## 2021-04-16 ENCOUNTER — Emergency Department
Admission: EM | Admit: 2021-04-16 | Discharge: 2021-04-16 | Disposition: A | Discharge status: Home / Self Care | Payer: Medicaid Other | Attending: Emergency Medicine | Admitting: Emergency Medicine

## 2021-04-16 ENCOUNTER — Emergency Department: Payer: Medicaid Other

## 2021-04-16 ENCOUNTER — Other Ambulatory Visit: Payer: Self-pay

## 2021-04-16 DIAGNOSIS — R000 Tachycardia, unspecified: Secondary | ICD-10-CM | POA: Diagnosis not present

## 2021-04-16 DIAGNOSIS — Z79899 Other long term (current) drug therapy: Secondary | ICD-10-CM | POA: Diagnosis not present

## 2021-04-16 DIAGNOSIS — T85528A Displacement of other gastrointestinal prosthetic devices, implants and grafts, initial encounter: Secondary | ICD-10-CM

## 2021-04-16 DIAGNOSIS — K9423 Gastrostomy malfunction: Secondary | ICD-10-CM | POA: Insufficient documentation

## 2021-04-16 MED ORDER — MIDAZOLAM HCL 2 MG/2ML IJ SOLN
INTRAMUSCULAR | Status: AC
  Administered 2021-04-16: 1 mg via INTRAMUSCULAR
  Filled 2021-04-16: qty 2

## 2021-04-16 MED ORDER — BACLOFEN 10 MG PO TABS
10.0000 mg | ORAL_TABLET | Freq: Three times a day (TID) | ORAL | Status: DC
  Filled 2021-04-16 (×3): qty 1

## 2021-04-16 MED ORDER — QUETIAPINE FUMARATE 25 MG PO TABS: 100.0000 mg | ORAL_TABLET | Freq: Every day | ORAL | Status: DC

## 2021-04-16 MED ORDER — ACETAMINOPHEN 325 MG PO TABS: 650.0000 mg | ORAL_TABLET | ORAL | Status: DC | PRN

## 2021-04-16 MED ORDER — MIDAZOLAM HCL 2 MG/2ML IJ SOLN
1.0000 mg | Freq: Once | INTRAMUSCULAR | Status: AC
  Administered 2021-04-16: 1 mg via INTRAMUSCULAR

## 2021-04-16 MED ORDER — MIDAZOLAM HCL 2 MG/2ML IJ SOLN: 1.0000 mg | Freq: Once | INTRAMUSCULAR | Status: AC

## 2021-04-16 MED ORDER — BISACODYL 10 MG RE SUPP
10.0000 mg | Freq: Every day | RECTAL | Status: DC | PRN
  Filled 2021-04-16: qty 1

## 2021-04-16 MED ORDER — AMANTADINE HCL 50 MG/5ML PO SOLN
100.0000 mg | Freq: Three times a day (TID) | ORAL | Status: DC
  Filled 2021-04-16 (×3): qty 10

## 2021-04-16 MED ORDER — GABAPENTIN 300 MG PO CAPS: 300.0000 mg | ORAL_CAPSULE | Freq: Three times a day (TID) | ORAL | Status: DC

## 2021-04-16 MED ORDER — PROPRANOLOL HCL 20 MG PO TABS: 10.0000 mg | ORAL_TABLET | Freq: Three times a day (TID) | ORAL | Status: DC

## 2021-04-16 NOTE — ED Notes (Signed)
Clean brief placed on pt 

## 2021-04-16 NOTE — ED Triage Notes (Signed)
Pt here from [facility] after pulling out his g-tube- per facility he had an 62F

## 2021-04-16 NOTE — ED Notes (Signed)
Called [facility] for transport 1230

## 2021-04-16 NOTE — ED Notes (Signed)
Attempted to call report to [facility]- was placed on hold for 5 minutes with no answer

## 2021-04-16 NOTE — ED Provider Notes (Signed)
[facility] Emergency Department Provider Note  ____________________________________________   Event Date/Time   First MD Initiated Contact with Patient 04/16/21 [phone]     (approximate)  I have reviewed the triage vital signs and the nursing notes.   HISTORY  Chief Complaint G-tube removal   HPI Alex [name] is a 25 y.o. male with past medical history of a seizure disorder and TBI's visit with oropharyngeal dysphagia status post G-tube placement with baseline mentation per review of records of "confusion/agitation, inappropriate verbalizations, non-purposeful movements and consistent activity, need for frequent redirection to task" who presents via EMS from nursing facility after he reportedly pulled out his G-tube.  Size 18 [place].  Patient unable to write any history secondary baseline mental status being identified to history.  Attempt to reach family but the number listed in chart went to voicemail.  I attempted to call facility x2 and was placed on hold at both times transfer to voicemail.  No other history is immediately available on presentation.   Past Medical History:  Diagnosis Date   Seizure disorder (HCC)    TBI (traumatic brain injury) (HCC)     There are no problems to display for this patient.   Past Surgical History:  Procedure Laterality Date   GASTROSTOMY TUBE PLACEMENT      Prior to Admission medications   Medication Sig Start Date End Date Taking? Authorizing Provider  acetaminophen (TYLENOL) 325 MG tablet Take 650 mg by mouth every 4 (four) hours as needed for mild pain or moderate pain.    [provider]  amantadine (SYMMETREL) 50 MG/5ML solution Take 100 mg by mouth 3 (three) times daily.    [provider]  baclofen (LIORESAL) 10 MG tablet Take 10 mg by mouth 3 (three) times daily.    [provider]  bisacodyl (DULCOLAX) 10 MG suppository Place 10 mg rectally daily as needed for moderate  constipation or severe constipation.    [provider]  cholecalciferol (VITAMIN D3) 25 MCG (1000 UNIT) tablet Take 1,000 Units by mouth daily.    [provider]  gabapentin (NEURONTIN) 100 MG capsule Take 100 mg by mouth 3 (three) times daily. (take with 100mg  capsules to equal 400mg  total per dose)    [provider]  gabapentin (NEURONTIN) 300 MG capsule Take 300 mg by mouth 3 (three) times daily. (take with 100mg  capsules to equal 400mg  total per dose)    [provider]  levETIRAcetam (KEPPRA) 1000 MG tablet Take 1,000 mg by mouth 2 (two) times daily.    [provider]  melatonin 3 MG TABS tablet Take 6 mg by mouth at bedtime.    [provider]  propranolol (INDERAL) 10 MG tablet Take 10 mg by mouth 3 (three) times daily.    [provider]  QUEtiapine (SEROQUEL) 100 MG tablet Take 100 mg by mouth at bedtime.    [provider]    Allergies Patient has no known allergies.  No family history on file.  Social History Social History   Tobacco Use   Smoking status: Never   Smokeless tobacco: Never    Review of Systems  Review of Systems  Unable to perform ROS: Medical condition     ____________________________________________   PHYSICAL EXAM:  VITAL SIGNS: ED Triage Vitals  Enc Vitals Group     BP      Pulse      Resp      Temp  Temp src      SpO2      Weight      Height      Head Circumference      Peak Flow      Pain Score      Pain Loc      Pain Edu?      Excl. in GC?    Vitals:   04/16/21 1030 04/16/21 1100  BP: 101/70 114/78  Pulse: (!) 115 (!) 119  Resp:  20  SpO2: 97% 99%   Physical Exam Vitals and nursing note reviewed.  Constitutional:      Appearance: He is well-developed.  HENT:     Head: Normocephalic and atraumatic.     Right Ear: External ear normal.     Left Ear: External ear normal.     Nose: Nose normal.  Eyes:     Conjunctiva/sclera: Conjunctivae  normal.  Cardiovascular:     Rate and Rhythm: Regular rhythm. Tachycardia present.     Heart sounds: No murmur heard. Pulmonary:     Effort: Pulmonary effort is normal. No respiratory distress.     Breath sounds: Normal breath sounds.  Abdominal:     Palpations: Abdomen is soft.     Tenderness: There is no abdominal tenderness.  Musculoskeletal:     Cervical back: Neck supple.  Skin:    General: Skin is warm and dry.  Neurological:     Mental Status: He is alert. Mental status is at baseline.    There is stoma left side of the abdomen with little bit of scar tissue and a small blood clot.  Abdomen is soft and benign.  Patient does not follow any commands and is noted to be intermittently writhing around the bed.  No overt seizure activity noted. ____________________________________________   LABS (all labs ordered are listed, but only abnormal results are displayed)  Labs Reviewed - No data to display ____________________________________________  EKG  ____________________________________________  RADIOLOGY  ED MD interpretation:   Official radiology report(s): No results found.  ____________________________________________   PROCEDURES  Procedure(s) performed (including Critical Care):  Gastrostomy tube replacement  Date/Time: 04/16/2021 11:14 AM Performed by: Gilles Chiquito, MD Authorized by: Gilles Chiquito, MD  Consent: Verbal consent not obtained. Procedure consent: procedure consent matches procedure scheduled Patient identity confirmed: arm band and provided demographic data Local anesthesia used: no  Anesthesia: Local anesthesia used: no Patient tolerance: patient tolerated the procedure well with no immediate complications     ____________________________________________   INITIAL IMPRESSION / ASSESSMENT AND PLAN / ED COURSE      Patient presents with above-stated history and exam the very limited history with concerns that he pulled out  his G-tube.  It is unclear exactly when this came out they were unable to reach any staff facility and EMS is not sure.  Also unable to reach family.  Review of records it seems that G-tube is been in place at least several months.  On arrival patient is afebrile hemodynamically stable.  Given he is unable full directions and was thrashing side to side in bed intermittently attempting to scratch staff he was given 2 of Versed for anxiolysis.  I was on third attempt able to place a 14 sized [place] G-tube.  Discharged back to nursing facility in stable condition.     ____________________________________________   FINAL CLINICAL IMPRESSION(S) / ED DIAGNOSES  Final diagnoses:  Dislodged gastrostomy tube    Medications  QUEtiapine (SEROQUEL) tablet 100 mg (has  no administration in time range)  propranolol (INDERAL) tablet 10 mg (has no administration in time range)  gabapentin (NEURONTIN) capsule 300 mg (has no administration in time range)  bisacodyl (DULCOLAX) suppository 10 mg (has no administration in time range)  baclofen (LIORESAL) tablet 10 mg (has no administration in time range)  amantadine (SYMMETREL) 50 MG/5ML solution 100 mg (has no administration in time range)  acetaminophen (TYLENOL) tablet 650 mg (has no administration in time range)  midazolam (VERSED) injection 1 mg (1 mg Intramuscular Given 04/16/21 0945)  midazolam (VERSED) injection 1 mg (1 mg Intramuscular Given 04/16/21 8638)     ED Discharge Orders     None        Note:  This document was prepared using Dragon voice recognition software and may include unintentional dictation errors.    Gilles Chiquito, MD 04/16/21 1210

## 2021-05-23 ENCOUNTER — Emergency Department
Admission: EM | Admit: 2021-05-23 | Discharge: 2021-05-23 | Disposition: A | Discharge status: Skilled Nursing Facility | Payer: Medicaid Other | Attending: Emergency Medicine | Admitting: Emergency Medicine

## 2021-05-23 ENCOUNTER — Other Ambulatory Visit: Payer: Self-pay

## 2021-05-23 ENCOUNTER — Encounter: Payer: Self-pay | Admitting: Emergency Medicine

## 2021-05-23 ENCOUNTER — Emergency Department: Payer: Medicaid Other

## 2021-05-23 DIAGNOSIS — R4182 Altered mental status, unspecified: Secondary | ICD-10-CM | POA: Diagnosis not present

## 2021-05-23 DIAGNOSIS — W19XXXA Unspecified fall, initial encounter: Secondary | ICD-10-CM

## 2021-05-23 DIAGNOSIS — J019 Acute sinusitis, unspecified: Secondary | ICD-10-CM

## 2021-05-23 DIAGNOSIS — Z20822 Contact with and (suspected) exposure to covid-19: Secondary | ICD-10-CM | POA: Diagnosis not present

## 2021-05-23 DIAGNOSIS — Z79899 Other long term (current) drug therapy: Secondary | ICD-10-CM | POA: Diagnosis not present

## 2021-05-23 LAB — CBC WITH DIFFERENTIAL/PLATELET
Abs Immature Granulocytes: 0.01 10*3/uL (ref 0.00–0.07)
Basophils Absolute: 0 10*3/uL (ref 0.0–0.1)
Basophils Relative: 0 %
Eosinophils Absolute: 0.1 10*3/uL (ref 0.0–0.5)
Eosinophils Relative: 2 %
HCT: 43 % (ref 39.0–52.0)
Hemoglobin: 14.5 g/dL (ref 13.0–17.0)
Immature Granulocytes: 0 %
Lymphocytes Relative: 13 %
Lymphs Abs: 0.8 10*3/uL (ref 0.7–4.0)
MCH: 31 pg (ref 26.0–34.0)
MCHC: 33.7 g/dL (ref 30.0–36.0)
MCV: 91.9 fL (ref 80.0–100.0)
Monocytes Absolute: 0.3 10*3/uL (ref 0.1–1.0)
Monocytes Relative: 5 %
Neutro Abs: 4.7 10*3/uL (ref 1.7–7.7)
Neutrophils Relative %: 80 %
Platelets: 293 10*3/uL (ref 150–400)
RBC: 4.68 MIL/uL (ref 4.22–5.81)
RDW: 12.1 % (ref 11.5–15.5)
WBC: 5.8 10*3/uL (ref 4.0–10.5)
nRBC: 0 % (ref 0.0–0.2)

## 2021-05-23 LAB — URINALYSIS, COMPLETE (UACMP) WITH MICROSCOPIC
Bacteria, UA: NONE SEEN
Bilirubin Urine: NEGATIVE
Glucose, UA: NEGATIVE mg/dL
Hgb urine dipstick: NEGATIVE
Ketones, ur: NEGATIVE mg/dL
Leukocytes,Ua: NEGATIVE
Nitrite: NEGATIVE
Protein, ur: NEGATIVE mg/dL
Specific Gravity, Urine: 1.012 (ref 1.005–1.030)
pH: 9 — ABNORMAL HIGH (ref 5.0–8.0)

## 2021-05-23 LAB — RESP PANEL BY RT-PCR (FLU A&B, COVID) ARPGX2
Influenza A by PCR: NEGATIVE
Influenza B by PCR: NEGATIVE
SARS Coronavirus 2 by RT PCR: NEGATIVE

## 2021-05-23 LAB — CK: Total CK: 368 U/L (ref 49–397)

## 2021-05-23 LAB — COMPREHENSIVE METABOLIC PANEL
ALT: 42 U/L (ref 0–44)
AST: 31 U/L (ref 15–41)
Albumin: 3.8 g/dL (ref 3.5–5.0)
Alkaline Phosphatase: 75 U/L (ref 38–126)
Anion gap: 9 (ref 5–15)
BUN: 10 mg/dL (ref 6–20)
CO2: 27 mmol/L (ref 22–32)
Calcium: 9.1 mg/dL (ref 8.9–10.3)
Chloride: 102 mmol/L (ref 98–111)
Creatinine, Ser: 0.48 mg/dL — ABNORMAL LOW (ref 0.61–1.24)
GFR, Estimated: >60 mL/min (ref >60)
Glucose, Bld: 99 mg/dL (ref 70–99)
Potassium: 4.3 mmol/L (ref 3.5–5.1)
Sodium: 138 mmol/L (ref 135–145)
Total Bilirubin: 0.8 mg/dL (ref 0.3–1.2)
Total Protein: 6.8 g/dL (ref 6.5–8.1)

## 2021-05-23 LAB — LACTIC ACID, PLASMA: Lactic Acid, Venous: 1 mmol/L (ref 0.5–1.9)

## 2021-05-23 MED ORDER — HALOPERIDOL LACTATE 5 MG/ML IJ SOLN: 5.0000 mg | Freq: Once | INTRAMUSCULAR | Status: AC

## 2021-05-23 MED ORDER — AMOXICILLIN 400 MG/5ML PO SUSR: 850.0000 mg | Freq: Two times a day (BID) | ORAL | 0 refills | Status: DC

## 2021-05-23 MED ORDER — HALOPERIDOL LACTATE 5 MG/ML IJ SOLN
INTRAMUSCULAR | Status: AC
  Administered 2021-05-23: 5 mg via INTRAMUSCULAR
  Filled 2021-05-23: qty 1

## 2021-05-23 MED ORDER — MIDAZOLAM HCL 2 MG/2ML IJ SOLN
INTRAMUSCULAR | Status: AC
  Administered 2021-05-23: 2 mg via INTRAMUSCULAR
  Filled 2021-05-23: qty 2

## 2021-05-23 MED ORDER — LACTATED RINGERS IV BOLUS
1000.0000 mL | Freq: Once | INTRAVENOUS | Status: AC
  Administered 2021-05-23: 1000 mL via INTRAVENOUS

## 2021-05-23 MED ORDER — AMOXICILLIN 400 MG/5ML PO SUSR: 850.0000 mg | Freq: Two times a day (BID) | ORAL | 0 refills | Status: AC

## 2021-05-23 MED ORDER — MIDAZOLAM HCL 2 MG/2ML IJ SOLN: 2.0000 mg | Freq: Once | INTRAMUSCULAR | Status: AC

## 2021-05-23 NOTE — ED Notes (Signed)
Patient transported to CT 
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
## 2021-05-23 NOTE — ED Notes (Signed)
Updated patient mother with the pending discharge and that we are just waiting on an ems truck

## 2021-05-23 NOTE — ED Triage Notes (Addendum)
Pt via EMS from [facility]. Per staff, pt had a fall 2-3 days ago. Per staff, pt is more combative than normal. Pt hx of TBI. On arrival, pt is confused, combative, and stating inappropriate language. Pt swinging R arm and kicking, pt not following directions. Unable to get any vitals at this time. Dr. Sidney Ace at bedside. Verbal orders for Haldol at this time.

## 2021-05-23 NOTE — ED Provider Notes (Signed)
CT Head Wo Contrast  Result Date: 05/23/2021 CLINICAL DATA:  Fall 2-3 days ago, increasing combativeness. EXAM: CT HEAD WITHOUT CONTRAST TECHNIQUE: Contiguous axial images were obtained from the base of the skull through the vertex without intravenous contrast. COMPARISON:  CT 01/13/2021 FINDINGS: Brain: Motion degraded examination may limit detection of subtle findings. Stable regions of bilateral frontotemporal and mild parietal encephalomalacia, unchanged from comparison exams. No new CT evident area of large vascular territory or cortically based infarct. No hyperdense hemorrhage. Stable ventricular caliber. No visible parenchymal lesion. No new mass effect or midline shift. Vascular: No hyperdense vessel or unexpected calcification. Skull: No discernible calvarial fracture or suspicious osseous lesion. No scalp swelling or hematoma. Sinuses/Orbits: Diffuse mural thickening throughout the paranasal sinuses. Layering air-fluid levels in the maxillary sinuses. Orbits suboptimally assessed. No gross orbital abnormality. Other: None. IMPRESSION: Multiple attempts at acquisition were made the exam remains motion degraded which may limit detection of subtle findings. No acute intracranial abnormality. Stable regions of bilateral frontotemporoparietal encephalomalacia and ex vacuo dilatation. Layering air-fluid levels within the maxillary sinuses on a background of more diffuse mild mural thickening, can correlate for clinical features of acute on chronic sinusitis. Electronically Signed   By: Kreg Shropshire M.D.   On: 05/23/2021 16:17   DG Chest Portable 1 View  Result Date: 05/23/2021 CLINICAL DATA:  Shortness of breath.  Recent fall. EXAM: PORTABLE CHEST 1 VIEW COMPARISON:  01/13/2021 FINDINGS: Endotracheal and enteric tubes have been removed. The cardiomediastinal silhouette is unchanged with normal heart size. There is mild elevation of the right hemidiaphragm. The patient's hand obscures a small portion of the  peripheral aspect of the left upper lung. No airspace consolidation, edema, pleural effusion, pneumothorax is identified. No acute osseous abnormality is seen. IMPRESSION: No active disease. Electronically Signed   By: Sebastian Ache M.D.   On: 05/23/2021 14:18      Imaging reviewed, CT with some artifact but grossly no acute neurologic abnormalities.  There is also some concern for acute on chronic maxillary sinusitis.  He has tested negative for COVID and influenza.  Based upon this, will treat with 7 days of amoxicillin per G-tube.  He is resting comfortably does not appear to be in any distress at this time.  Heart rate is minimally elevated approximating 105, remainder of exam is unremarkable.  Patient appears to be stable for further care management and follow-up at his care facility, prescription for amoxicillin provided.  I did attempt to call and speak with the patient's mother, the number listed for our demographics goes to a phone call her who reports he just got a new cell phone and just got this phone number and does not the mother's number. No patient info provided and will attempt alternativ emeans to contact mother.    Sharyn Creamer, MD 05/23/21 1850

## 2021-05-23 NOTE — ED Notes (Signed)
Attempted to call [facility] to give report at this time, unsuccessful attempt at this time.

## 2021-05-23 NOTE — ED Notes (Signed)
[facility] called for EMS transport back to [facility]

## 2021-05-23 NOTE — ED Notes (Addendum)
Lauretta Chester, pt's mother updated at this time.
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
## 2021-05-30 ENCOUNTER — Emergency Department: Payer: Medicaid Other

## 2021-05-30 ENCOUNTER — Emergency Department
Admission: EM | Admit: 2021-05-30 | Discharge: 2021-05-31 | Disposition: A | Discharge status: Other Acute Inpatient Hospital | Payer: Medicaid Other | Attending: Emergency Medicine | Admitting: Emergency Medicine

## 2021-05-30 DIAGNOSIS — Z20822 Contact with and (suspected) exposure to covid-19: Secondary | ICD-10-CM | POA: Diagnosis not present

## 2021-05-30 DIAGNOSIS — R000 Tachycardia, unspecified: Secondary | ICD-10-CM | POA: Insufficient documentation

## 2021-05-30 DIAGNOSIS — Z79899 Other long term (current) drug therapy: Secondary | ICD-10-CM | POA: Diagnosis not present

## 2021-05-30 DIAGNOSIS — G40911 Epilepsy, unspecified, intractable, with status epilepticus: Secondary | ICD-10-CM | POA: Insufficient documentation

## 2021-05-30 DIAGNOSIS — G40901 Epilepsy, unspecified, not intractable, with status epilepticus: Secondary | ICD-10-CM

## 2021-05-30 DIAGNOSIS — R569 Unspecified convulsions: Secondary | ICD-10-CM | POA: Diagnosis present

## 2021-05-30 LAB — BLOOD GAS, ARTERIAL
Acid-Base Excess: 3.3 mmol/L — ABNORMAL HIGH (ref 0.0–2.0)
Bicarbonate: 29.1 mmol/L — ABNORMAL HIGH (ref 20.0–28.0)
FIO2: 1
MECHVT: 440 mL
Mechanical Rate: 16
O2 Saturation: 100 %
PEEP: 5 cmH2O
Patient temperature: 37
pCO2 arterial: 48 mmHg (ref 32.0–48.0)
pH, Arterial: 7.39 (ref 7.350–7.450)
pO2, Arterial: 540 mmHg — ABNORMAL HIGH (ref 83.0–108.0)

## 2021-05-30 LAB — CBC WITH DIFFERENTIAL/PLATELET
Abs Immature Granulocytes: 0.04 10*3/uL (ref 0.00–0.07)
Basophils Absolute: 0 10*3/uL (ref 0.0–0.1)
Basophils Relative: 0 %
Eosinophils Absolute: 0.1 10*3/uL (ref 0.0–0.5)
Eosinophils Relative: 2 %
HCT: 37.8 % — ABNORMAL LOW (ref 39.0–52.0)
Hemoglobin: 12.8 g/dL — ABNORMAL LOW (ref 13.0–17.0)
Immature Granulocytes: 1 %
Lymphocytes Relative: 6 %
Lymphs Abs: 0.5 10*3/uL — ABNORMAL LOW (ref 0.7–4.0)
MCH: 32.6 pg (ref 26.0–34.0)
MCHC: 33.9 g/dL (ref 30.0–36.0)
MCV: 96.2 fL (ref 80.0–100.0)
Monocytes Absolute: 0.3 10*3/uL (ref 0.1–1.0)
Monocytes Relative: 5 %
Neutro Abs: 6.6 10*3/uL (ref 1.7–7.7)
Neutrophils Relative %: 86 %
Platelets: 281 10*3/uL (ref 150–400)
RBC: 3.93 MIL/uL — ABNORMAL LOW (ref 4.22–5.81)
RDW: 11.9 % (ref 11.5–15.5)
WBC: 7.6 10*3/uL (ref 4.0–10.5)
nRBC: 0 % (ref 0.0–0.2)

## 2021-05-30 LAB — COMPREHENSIVE METABOLIC PANEL
ALT: 15 U/L (ref 0–44)
AST: 13 U/L — ABNORMAL LOW (ref 15–41)
Albumin: 1.9 g/dL — ABNORMAL LOW (ref 3.5–5.0)
Alkaline Phosphatase: 38 U/L (ref 38–126)
Anion gap: 0 — ABNORMAL LOW (ref 5–15)
BUN: 8 mg/dL (ref 6–20)
CO2: 19 mmol/L — ABNORMAL LOW (ref 22–32)
Calcium: 4.6 mg/dL — CL (ref 8.9–10.3)
Chloride: 126 mmol/L — ABNORMAL HIGH (ref 98–111)
Creatinine, Ser: <0.3 mg/dL — ABNORMAL LOW (ref 0.61–1.24)
Glucose, Bld: 95 mg/dL (ref 70–99)
Potassium: 2.1 mmol/L — CL (ref 3.5–5.1)
Sodium: 145 mmol/L (ref 135–145)
Total Bilirubin: 0.4 mg/dL (ref 0.3–1.2)
Total Protein: 3.3 g/dL — ABNORMAL LOW (ref 6.5–8.1)

## 2021-05-30 LAB — VALPROIC ACID LEVEL: Valproic Acid Lvl: <10 ug/mL — ABNORMAL LOW (ref 50.0–100.0)

## 2021-05-30 LAB — URINALYSIS, COMPLETE (UACMP) WITH MICROSCOPIC
Bacteria, UA: NONE SEEN
Bilirubin Urine: NEGATIVE
Glucose, UA: NEGATIVE mg/dL
Hgb urine dipstick: NEGATIVE
Ketones, ur: NEGATIVE mg/dL
Leukocytes,Ua: NEGATIVE
Nitrite: NEGATIVE
Protein, ur: NEGATIVE mg/dL
Specific Gravity, Urine: 1.024 (ref 1.005–1.030)
pH: 6 (ref 5.0–8.0)

## 2021-05-30 LAB — MAGNESIUM: Magnesium: 1.5 mg/dL — ABNORMAL LOW (ref 1.7–2.4)

## 2021-05-30 MED ORDER — SODIUM CHLORIDE 0.9 % IV SOLN: 2500.0000 mg | Freq: Once | INTRAVENOUS | Status: DC

## 2021-05-30 MED ORDER — LORAZEPAM 2 MG/ML IJ SOLN
INTRAMUSCULAR | Status: AC
  Administered 2021-05-30: 2 mg via INTRAVENOUS
  Filled 2021-05-30: qty 1

## 2021-05-30 MED ORDER — LORAZEPAM 2 MG/ML IJ SOLN: 2.0000 mg | Freq: Once | INTRAMUSCULAR | Status: AC

## 2021-05-30 MED ORDER — PROPOFOL 1000 MG/100ML IV EMUL
5.0000 ug/kg/min | INTRAVENOUS | Status: DC
  Administered 2021-05-30: 5.007 ug/kg/min via INTRAVENOUS
  Filled 2021-05-30: qty 100

## 2021-05-30 MED ORDER — LACTATED RINGERS IV BOLUS
1000.0000 mL | Freq: Once | INTRAVENOUS | Status: AC
  Administered 2021-05-30: 1000 mL via INTRAVENOUS

## 2021-05-30 MED ORDER — SODIUM CHLORIDE 0.9 % IV SOLN
Freq: Once | INTRAVENOUS | Status: AC
  Filled 2021-05-30: qty 200

## 2021-05-30 MED ORDER — SODIUM CHLORIDE 0.9 % IV SOLN
60.0000 mg/kg | Freq: Once | INTRAVENOUS | Status: DC
  Administered 2021-05-30: 2860 mg via INTRAVENOUS
  Filled 2021-05-30: qty 28.6

## 2021-05-30 MED ORDER — PROPOFOL 10 MG/ML IV BOLUS
50.0000 mg | Freq: Once | INTRAVENOUS | Status: AC
  Administered 2021-05-30: 50 mg via INTRAVENOUS
  Filled 2021-05-30: qty 20

## 2021-05-30 NOTE — ED Notes (Signed)
Called and spoke with Clent Jacks. To consult Neurology for possible patient transfer to [facility] @ 9:14 pm

## 2021-05-30 NOTE — ED Triage Notes (Signed)
Pt presents from [facility] care , where he was having seizure like activity . The pt does have a pmh of seizures .   Pt appears to be sick and to be having a focal motor seizure inolvling hir right eye only , Pt is bed confined and had contractures of all  4 extremities .   Vitals as noted in chart

## 2021-05-30 NOTE — ED Provider Notes (Signed)
[facility]  ____________________________________________   Event Date/Time   First MD Initiated Contact with Patient 05/30/21 1944     (approximate)  I have reviewed the triage vital signs and the nursing notes.   HISTORY  Chief Complaint Seizures    HPI Alex [name] is a 25 y.o. male past medical history of TBI and seizure disorder on Depakote who presents after seizures.  Per EMS the patient had generalized tonic-clonic seizures at his facility.  When they arrived they gave him 4 mg of IV Versed and this stopped his tonic-clonic activity.  I am unable to obtain additional history regarding onset, quality, timing or associated symptoms secondary to the patient's altered mental status.         Past Medical History:  Diagnosis Date   Seizure disorder (HCC)    TBI (traumatic brain injury) (HCC)     There are no problems to display for this patient.   Past Surgical History:  Procedure Laterality Date   GASTROSTOMY TUBE PLACEMENT      Prior to Admission medications   Medication Sig Start Date End Date Taking? Authorizing Provider  acetaminophen (TYLENOL) 325 MG tablet Take 650 mg by mouth every 4 (four) hours as needed for mild pain or moderate pain.    [provider]  amantadine (SYMMETREL) 50 MG/5ML solution Take 100 mg by mouth 3 (three) times daily.    [provider]  amoxicillin (AMOXIL) 400 MG/5ML suspension Place 10.6 mLs (850 mg total) into feeding tube 2 (two) times daily for 7 days. 05/23/21 05/30/21  Sharyn Creamer, MD  baclofen (LIORESAL) 10 MG tablet Take 10 mg by mouth 3 (three) times daily.    [provider]  bisacodyl (DULCOLAX) 10 MG suppository Place 10 mg rectally daily as needed for moderate constipation or severe constipation.    [provider]  cholecalciferol (VITAMIN D3) 25 MCG (1000 UNIT) tablet Take 1,000 Units by mouth daily.    [provider]  gabapentin (NEURONTIN) 100 MG  capsule Take 100 mg by mouth 3 (three) times daily. (take with 100mg  capsules to equal 400mg  total per dose)    [provider]  gabapentin (NEURONTIN) 300 MG capsule Take 300 mg by mouth 3 (three) times daily. (take with 100mg  capsules to equal 400mg  total per dose)    [provider]  levETIRAcetam (KEPPRA) 1000 MG tablet Take 1,000 mg by mouth 2 (two) times daily.    [provider]  melatonin 3 MG TABS tablet Take 6 mg by mouth at bedtime.    [provider]  propranolol (INDERAL) 10 MG tablet Take 10 mg by mouth 3 (three) times daily.    [provider]  QUEtiapine (SEROQUEL) 100 MG tablet Take 100 mg by mouth at bedtime.    [provider]    Allergies Patient has no known allergies.  No family history on file.  Social History Social History   Tobacco Use   Smoking status: Never   Smokeless tobacco: Never    Review of Systems   Review of Systems  Unable to perform ROS: Mental status change   Physical Exam Updated Vital Signs BP 104/81   Pulse 93   Temp 97.8 F (36.6 C)   Resp (!) 23   SpO2 100%   Physical Exam Vitals and nursing note reviewed.  Constitutional:      General: He is not in acute distress.    Comments: Chronically ill-appearing man  HENT:  Head: Normocephalic and atraumatic.  Eyes:     General: No scleral icterus.    Conjunctiva/sclera: Conjunctivae normal.     Comments: Pupils are 4 mm and equally reactive  Cardiovascular:     Rate and Rhythm: Tachycardia present.  Pulmonary:     Effort: Pulmonary effort is normal. No respiratory distress.     Breath sounds: Normal breath sounds. No wheezing.  Musculoskeletal:        General: No deformity or signs of injury.     Cervical back: Normal range of motion.  Skin:    Coloration: Skin is not jaundiced or pale.  Neurological:     Comments: Patient's eyes are open Twitching of the right eye and right side of the mouth Left arm is held in  flexion and hypertonic Right arm with increased tonicity Does not not withdrawal to pain  Psychiatric:     Comments: Unable to assess     LABS (all labs ordered are listed, but only abnormal results are displayed)  Labs Reviewed  CBC WITH DIFFERENTIAL/PLATELET - Abnormal; Notable for the following components:      Result Value   RBC 3.93 (*)    Hemoglobin 12.8 (*)    HCT 37.8 (*)    Lymphs Abs 0.5 (*)    All other components within normal limits  URINALYSIS, COMPLETE (UACMP) WITH MICROSCOPIC - Abnormal; Notable for the following components:   Color, Urine YELLOW (*)    APPearance CLEAR (*)    All other components within normal limits  COMPREHENSIVE METABOLIC PANEL - Abnormal; Notable for the following components:   Potassium 2.1 (*)    Chloride 126 (*)    CO2 19 (*)    Creatinine, Ser <0.30 (*)    Calcium 4.6 (*)    Total Protein 3.3 (*)    Albumin 1.9 (*)    AST 13 (*)    Anion gap 0 (*)    All other components within normal limits  VALPROIC ACID LEVEL - Abnormal; Notable for the following components:   Valproic Acid Lvl <10 (*)    All other components within normal limits  MAGNESIUM - Abnormal; Notable for the following components:   Magnesium 1.5 (*)    All other components within normal limits  BLOOD GAS, ARTERIAL - Abnormal; Notable for the following components:   pO2, Arterial 540 (*)    Bicarbonate 29.1 (*)    Acid-Base Excess 3.3 (*)    Allens test (pass/fail) YES (*)    All other components within normal limits  URINE CULTURE  RESP PANEL BY RT-PCR (FLU A&B, COVID) ARPGX2  LACTIC ACID, PLASMA  LACTIC ACID, PLASMA  BASIC METABOLIC PANEL  CBG MONITORING, ED   ____________________________________________  EKG  N/a ____________________________________________  RADIOLOGY Ky Barban, personally viewed and evaluated these images (plain radiographs) as part of my medical decision making, as well as reviewing the written report by the  radiologist.  ED MD interpretation:  n/a    ____________________________________________   PROCEDURES  Procedure(s) performed (including Critical Care):  Date/Time: 05/30/2021 11:43 PM Performed by: Georga Hacking, MD Oxygen Delivery Method: Nasal cannula Preoxygenation: Pre-oxygenation with 100% oxygen Induction Type: Rapid sequence Ventilation: Mask ventilation without difficulty Laryngoscope Size: Glidescope Grade View: Grade I Tube size: 7.5 mm Number of attempts: 1 Placement Confirmation: ETT inserted through vocal cords under direct vision, Positive ETCO2 and Breath sounds checked- equal and bilateral Secured at: 23 cm Tube secured with: ETT holder    .Critical Care  Date/Time: 05/30/2021 11:49 PM Performed by: Georga Hacking, MD Authorized by: Georga Hacking, MD   Critical care provider statement:    Critical care time (minutes):  45   Critical care time was exclusive of:  Separately billable procedures and treating other patients   Critical care was necessary to treat or prevent imminent or life-threatening deterioration of the following conditions:  CNS failure or compromise   Critical care was time spent personally by me on the following activities:  Discussions with consultants, evaluation of patient's response to treatment, examination of patient, ordering and performing treatments and interventions, ordering and review of laboratory studies, ordering and review of radiographic studies, re-evaluation of patient's condition and ventilator management   I assumed direction of critical care for this patient from another provider in my specialty: no     Care discussed with: admitting provider and accepting provider at another facility     ____________________________________________   INITIAL IMPRESSION / ASSESSMENT AND PLAN / ED COURSE     The patient is a 25 year old male with history of TBI and epilepsy who presents in status epilepticus.   Apparently was having generalized tonic-clonic activity at his facility and received Versed with EMS.  On arrival he appears to be having focal seizures versus nonconvulsive status with his twitching of the right eye and right part of his mouth.  Patient was given 2 of IV Ativan and loaded with 60 mg/kg of Keppra.  Initially this appeared to stop the seizure-like activity.  However I was then called to the patient's room when his heart rate was in the 140s and his right arm was hypertonic.  Patient minimally responsive during this.  The decision was made to intubate in order to start on a propofol drip.  He was intubated successfully with  roc and etomidate.  He was then started on a propofol drip and given a propofol bolus.  Patient does need continuous EEG given the concern for ongoing seizure-like activity.  Unfortunately there is not EEG [facility] at [facility].  Discussed with neurology at [facility] who agrees with the plan.  Of note patient's labs initially showing a potassium of 2.1 and a calcium of 4.6.  Will repeat prior to repleting.  Patient was accepted by critical care team at [facility] health.  Per neurology, EEG tech is only available until 1:30 AM.  In order to facilitate him getting this EEG he was transported prior to obtaining head CT or repeat labs.  I felt that it was important to wait for repeat labs as his initial CMP was very abnormal.      ____________________________________________   FINAL CLINICAL IMPRESSION(S) / ED DIAGNOSES  Final diagnoses:  Status epilepticus [facility])     ED Discharge Orders     None        Note:  This document was prepared using Dragon voice recognition software and may include unintentional dictation errors.    Georga Hacking, MD 05/30/21 2350

## 2021-05-30 NOTE — ED Notes (Signed)
[facility] called and spoke with Dr. Sidney Ace @ 9:20pm

## 2021-05-31 ENCOUNTER — Inpatient Hospital Stay (HOSPITAL_COMMUNITY): Payer: Medicaid Other

## 2021-05-31 ENCOUNTER — Inpatient Hospital Stay (HOSPITAL_COMMUNITY)
Admission: AD | Admit: 2021-05-31 | Discharge: 2021-06-06 | DRG: 100 | Disposition: A | Discharge status: Skilled Nursing Facility | Payer: Medicaid Other | Source: Skilled Nursing Facility | Attending: Internal Medicine | Admitting: Internal Medicine

## 2021-05-31 DIAGNOSIS — Z20822 Contact with and (suspected) exposure to covid-19: Secondary | ICD-10-CM | POA: Diagnosis present

## 2021-05-31 DIAGNOSIS — I959 Hypotension, unspecified: Secondary | ICD-10-CM | POA: Diagnosis not present

## 2021-05-31 DIAGNOSIS — Z781 Physical restraint status: Secondary | ICD-10-CM | POA: Diagnosis not present

## 2021-05-31 DIAGNOSIS — G40901 Epilepsy, unspecified, not intractable, with status epilepticus: Principal | ICD-10-CM

## 2021-05-31 DIAGNOSIS — Z681 Body mass index (BMI) 19 or less, adult: Secondary | ICD-10-CM

## 2021-05-31 DIAGNOSIS — J9601 Acute respiratory failure with hypoxia: Secondary | ICD-10-CM | POA: Diagnosis present

## 2021-05-31 DIAGNOSIS — Z978 Presence of other specified devices: Secondary | ICD-10-CM

## 2021-05-31 DIAGNOSIS — R451 Restlessness and agitation: Secondary | ICD-10-CM | POA: Diagnosis not present

## 2021-05-31 DIAGNOSIS — Z79899 Other long term (current) drug therapy: Secondary | ICD-10-CM

## 2021-05-31 DIAGNOSIS — S069X0S Unspecified intracranial injury without loss of consciousness, sequela: Secondary | ICD-10-CM | POA: Diagnosis not present

## 2021-05-31 DIAGNOSIS — G9341 Metabolic encephalopathy: Secondary | ICD-10-CM | POA: Diagnosis present

## 2021-05-31 DIAGNOSIS — E440 Moderate protein-calorie malnutrition: Secondary | ICD-10-CM | POA: Insufficient documentation

## 2021-05-31 DIAGNOSIS — Z431 Encounter for attention to gastrostomy: Secondary | ICD-10-CM

## 2021-05-31 DIAGNOSIS — L308 Other specified dermatitis: Secondary | ICD-10-CM | POA: Diagnosis present

## 2021-05-31 DIAGNOSIS — E876 Hypokalemia: Secondary | ICD-10-CM

## 2021-05-31 DIAGNOSIS — Z87820 Personal history of traumatic brain injury: Secondary | ICD-10-CM | POA: Diagnosis not present

## 2021-05-31 DIAGNOSIS — Z931 Gastrostomy status: Secondary | ICD-10-CM

## 2021-05-31 DIAGNOSIS — S069X9A Unspecified intracranial injury with loss of consciousness of unspecified duration, initial encounter: Secondary | ICD-10-CM | POA: Diagnosis present

## 2021-05-31 DIAGNOSIS — G8114 Spastic hemiplegia affecting left nondominant side: Secondary | ICD-10-CM | POA: Diagnosis present

## 2021-05-31 DIAGNOSIS — S069XAA Unspecified intracranial injury with loss of consciousness status unknown, initial encounter: Secondary | ICD-10-CM | POA: Diagnosis present

## 2021-05-31 HISTORY — DX: Presence of other specified devices: Z97.8

## 2021-05-31 LAB — PHOSPHORUS
Phosphorus: 3 mg/dL (ref 2.5–4.6)
Phosphorus: 3.1 mg/dL (ref 2.5–4.6)

## 2021-05-31 LAB — HEMOGLOBIN A1C
Hgb A1c MFr Bld: 4.9 % (ref 4.8–5.6)
Mean Plasma Glucose: 93.93 mg/dL

## 2021-05-31 LAB — POCT I-STAT 7, (LYTES, BLD GAS, ICA,H+H)
Acid-Base Excess: 2 mmol/L (ref 0.0–2.0)
Bicarbonate: 26.7 mmol/L (ref 20.0–28.0)
Calcium, Ion: 1.23 mmol/L (ref 1.15–1.40)
HCT: 38 % — ABNORMAL LOW (ref 39.0–52.0)
Hemoglobin: 12.9 g/dL — ABNORMAL LOW (ref 13.0–17.0)
O2 Saturation: 99 %
Patient temperature: 99.1
Potassium: 3.8 mmol/L (ref 3.5–5.1)
Sodium: 141 mmol/L (ref 135–145)
TCO2: 28 mmol/L (ref 22–32)
pCO2 arterial: 39.7 mmHg (ref 32.0–48.0)
pH, Arterial: 7.438 (ref 7.350–7.450)
pO2, Arterial: 151 mmHg — ABNORMAL HIGH (ref 83.0–108.0)

## 2021-05-31 LAB — GLUCOSE, CAPILLARY
Glucose-Capillary: 103 mg/dL — ABNORMAL HIGH (ref 70–99)
Glucose-Capillary: 109 mg/dL — ABNORMAL HIGH (ref 70–99)
Glucose-Capillary: 119 mg/dL — ABNORMAL HIGH (ref 70–99)
Glucose-Capillary: 122 mg/dL — ABNORMAL HIGH (ref 70–99)
Glucose-Capillary: 137 mg/dL — ABNORMAL HIGH (ref 70–99)
Glucose-Capillary: 98 mg/dL (ref 70–99)

## 2021-05-31 LAB — COMPREHENSIVE METABOLIC PANEL
ALT: 28 U/L (ref 0–44)
AST: 27 U/L (ref 15–41)
Albumin: 3.4 g/dL — ABNORMAL LOW (ref 3.5–5.0)
Alkaline Phosphatase: 64 U/L (ref 38–126)
Anion gap: 8 (ref 5–15)
BUN: 11 mg/dL (ref 6–20)
CO2: 26 mmol/L (ref 22–32)
Calcium: 9.2 mg/dL (ref 8.9–10.3)
Chloride: 104 mmol/L (ref 98–111)
Creatinine, Ser: 0.75 mg/dL (ref 0.61–1.24)
GFR, Estimated: >60 mL/min (ref >60)
Glucose, Bld: 125 mg/dL — ABNORMAL HIGH (ref 70–99)
Potassium: 4.2 mmol/L (ref 3.5–5.1)
Sodium: 138 mmol/L (ref 135–145)
Total Bilirubin: 1.1 mg/dL (ref 0.3–1.2)
Total Protein: 6 g/dL — ABNORMAL LOW (ref 6.5–8.1)

## 2021-05-31 LAB — HIV ANTIBODY (ROUTINE TESTING W REFLEX): HIV Screen 4th Generation wRfx: NONREACTIVE

## 2021-05-31 LAB — MAGNESIUM
Magnesium: 1.8 mg/dL (ref 1.7–2.4)
Magnesium: 1.7 mg/dL (ref 1.7–2.4)

## 2021-05-31 LAB — RESP PANEL BY RT-PCR (FLU A&B, COVID) ARPGX2
Influenza A by PCR: NEGATIVE
Influenza B by PCR: NEGATIVE
SARS Coronavirus 2 by RT PCR: NEGATIVE

## 2021-05-31 LAB — CBC
HCT: 41.1 % (ref 39.0–52.0)
Hemoglobin: 13.8 g/dL (ref 13.0–17.0)
MCH: 31.9 pg (ref 26.0–34.0)
MCHC: 33.6 g/dL (ref 30.0–36.0)
MCV: 94.9 fL (ref 80.0–100.0)
Platelets: 336 10*3/uL (ref 150–400)
RBC: 4.33 MIL/uL (ref 4.22–5.81)
RDW: 11.9 % (ref 11.5–15.5)
WBC: 11.3 10*3/uL — ABNORMAL HIGH (ref 4.0–10.5)
nRBC: 0 % (ref 0.0–0.2)

## 2021-05-31 LAB — MRSA NEXT GEN BY PCR, NASAL: MRSA by PCR Next Gen: NOT DETECTED

## 2021-05-31 MED ORDER — POTASSIUM CHLORIDE 2 MEQ/ML IV SOLN
INTRAVENOUS | Status: DC
  Filled 2021-05-31: qty 1000

## 2021-05-31 MED ORDER — VALPROIC ACID 250 MG/5ML PO SOLN: 250.0000 mg | Freq: Three times a day (TID) | ORAL | Status: DC

## 2021-05-31 MED ORDER — PROPOFOL 1000 MG/100ML IV EMUL
5.0000 ug/kg/min | INTRAVENOUS | Status: DC
  Administered 2021-05-31: 20 ug/kg/min via INTRAVENOUS
  Administered 2021-05-31: 35 ug/kg/min via INTRAVENOUS
  Administered 2021-05-31: 20 ug/kg/min via INTRAVENOUS
  Administered 2021-06-01: 45 ug/kg/min via INTRAVENOUS
  Filled 2021-05-31 (×4): qty 100

## 2021-05-31 MED ORDER — CHLORHEXIDINE GLUCONATE 0.12% ORAL RINSE (MEDLINE KIT)
15.0000 mL | Freq: Two times a day (BID) | OROMUCOSAL | Status: DC
  Administered 2021-05-31 – 2021-06-02 (×5): 15 mL via OROMUCOSAL

## 2021-05-31 MED ORDER — POLYETHYLENE GLYCOL 3350 17 G PO PACK
17.0000 g | PACK | Freq: Every day | ORAL | Status: DC
  Administered 2021-05-31 – 2021-06-06 (×7): 17 g
  Filled 2021-05-31 (×7): qty 1

## 2021-05-31 MED ORDER — DIVALPROEX SODIUM 125 MG PO CSDR
250.0000 mg | DELAYED_RELEASE_CAPSULE | Freq: Three times a day (TID) | ORAL | Status: DC
  Administered 2021-05-31: 250 mg via ORAL
  Filled 2021-05-31: qty 2

## 2021-05-31 MED ORDER — SODIUM CHLORIDE 0.9 % IV SOLN: INTRAVENOUS | Status: DC

## 2021-05-31 MED ORDER — PROPRANOLOL HCL 10 MG PO TABS
10.0000 mg | ORAL_TABLET | Freq: Three times a day (TID) | ORAL | Status: DC
  Administered 2021-05-31 – 2021-06-03 (×10): 10 mg
  Filled 2021-05-31 (×12): qty 1

## 2021-05-31 MED ORDER — TRIAMCINOLONE ACETONIDE 0.1 % EX OINT
TOPICAL_OINTMENT | Freq: Two times a day (BID) | CUTANEOUS | Status: DC
  Administered 2021-06-01 – 2021-06-02 (×3): 1 via TOPICAL
  Filled 2021-05-31: qty 15

## 2021-05-31 MED ORDER — POTASSIUM CHLORIDE 2 MEQ/ML IV SOLN: INTRAVENOUS | Status: DC

## 2021-05-31 MED ORDER — LACTATED RINGERS IV SOLN: INTRAVENOUS | Status: DC

## 2021-05-31 MED ORDER — FAMOTIDINE IN NACL 20-0.9 MG/50ML-% IV SOLN
20.0000 mg | Freq: Two times a day (BID) | INTRAVENOUS | Status: DC
  Administered 2021-05-31 – 2021-06-02 (×6): 20 mg via INTRAVENOUS
  Filled 2021-05-31 (×7): qty 50

## 2021-05-31 MED ORDER — DOCUSATE SODIUM 50 MG/5ML PO LIQD
100.0000 mg | Freq: Two times a day (BID) | ORAL | Status: DC
  Administered 2021-05-31 – 2021-06-06 (×11): 100 mg
  Filled 2021-05-31 (×11): qty 10

## 2021-05-31 MED ORDER — OSMOLITE 1.5 CAL PO LIQD
1000.0000 mL | ORAL | Status: DC
  Administered 2021-05-31 – 2021-06-03 (×4): 1000 mL
  Filled 2021-05-31 (×6): qty 1000

## 2021-05-31 MED ORDER — POLYETHYLENE GLYCOL 3350 17 G PO PACK: 17.0000 g | PACK | Freq: Every day | ORAL | Status: DC | PRN

## 2021-05-31 MED ORDER — LEVETIRACETAM IN NACL 1000 MG/100ML IV SOLN
1000.0000 mg | Freq: Two times a day (BID) | INTRAVENOUS | Status: DC
  Administered 2021-05-31 – 2021-06-02 (×5): 1000 mg via INTRAVENOUS
  Filled 2021-05-31 (×5): qty 100

## 2021-05-31 MED ORDER — GABAPENTIN 250 MG/5ML PO SOLN
400.0000 mg | Freq: Three times a day (TID) | ORAL | Status: DC
  Administered 2021-05-31 – 2021-06-06 (×20): 400 mg
  Filled 2021-05-31 (×24): qty 8

## 2021-05-31 MED ORDER — MIDAZOLAM HCL 2 MG/2ML IJ SOLN
2.0000 mg | INTRAMUSCULAR | Status: DC | PRN
  Administered 2021-06-01: 2 mg via INTRAVENOUS
  Filled 2021-05-31: qty 2

## 2021-05-31 MED ORDER — CHLORHEXIDINE GLUCONATE CLOTH 2 % EX PADS
6.0000 | MEDICATED_PAD | Freq: Every day | CUTANEOUS | Status: DC
  Administered 2021-05-31 – 2021-06-06 (×7): 6 via TOPICAL

## 2021-05-31 MED ORDER — PROSOURCE TF PO LIQD
45.0000 mL | Freq: Two times a day (BID) | ORAL | Status: DC
  Administered 2021-05-31 – 2021-06-06 (×13): 45 mL
  Filled 2021-05-31 (×13): qty 45

## 2021-05-31 MED ORDER — BACLOFEN 1 MG/ML ORAL SUSPENSION
10.0000 mg | Freq: Three times a day (TID) | ORAL | Status: DC
  Administered 2021-05-31 – 2021-06-06 (×19): 10 mg
  Filled 2021-05-31 (×23): qty 10

## 2021-05-31 MED ORDER — IPRATROPIUM-ALBUTEROL 0.5-2.5 (3) MG/3ML IN SOLN
3.0000 mL | Freq: Four times a day (QID) | RESPIRATORY_TRACT | Status: DC
  Administered 2021-05-31 – 2021-06-01 (×6): 3 mL via RESPIRATORY_TRACT
  Filled 2021-05-31 (×6): qty 3

## 2021-05-31 MED ORDER — HEPARIN SODIUM (PORCINE) 5000 UNIT/ML IJ SOLN
5000.0000 [IU] | Freq: Three times a day (TID) | INTRAMUSCULAR | Status: DC
  Administered 2021-05-31 – 2021-06-03 (×10): 5000 [IU] via SUBCUTANEOUS
  Filled 2021-05-31 (×10): qty 1

## 2021-05-31 MED ORDER — ORAL CARE MOUTH RINSE
15.0000 mL | OROMUCOSAL | Status: DC
  Administered 2021-05-31 – 2021-06-02 (×23): 15 mL via OROMUCOSAL

## 2021-05-31 MED ORDER — DOCUSATE SODIUM 100 MG PO CAPS: 100.0000 mg | ORAL_CAPSULE | Freq: Two times a day (BID) | ORAL | Status: DC | PRN

## 2021-05-31 MED ORDER — ZINC OXIDE 40 % EX OINT
TOPICAL_OINTMENT | Freq: Two times a day (BID) | CUTANEOUS | Status: DC
  Administered 2021-05-31 – 2021-06-02 (×4): 1 via TOPICAL
  Filled 2021-05-31 (×2): qty 57

## 2021-05-31 MED ORDER — VALPROIC ACID 250 MG/5ML PO SOLN
250.0000 mg | Freq: Three times a day (TID) | ORAL | Status: DC
  Administered 2021-05-31 – 2021-06-02 (×6): 250 mg
  Filled 2021-05-31 (×6): qty 5

## 2021-05-31 NOTE — Progress Notes (Signed)
Pt seen and labs followed, weaning sedation for neuro exam. Aed per neuro.  Cont vent seupport.  No charge.

## 2021-05-31 NOTE — Progress Notes (Signed)
Same day progress note Patient briefly seen and examined in the ICU On minimal propofol which was held for sedation Has left upper and lower extremity spastic tone. Minimal withdrawal to noxious simulation Equal pupils with roving eye movements Routine EEG with no ongoing seizures but right greater than left frontal spikes and continuous generalized as well as lateralized left hemispheric slowing due to underlying structural abnormality. Continue current treatment No brain imaging was performed-I would recommend a CT head when able to. We will continue to follow with you D/w Dr Gaynell Face on the unit.  -- Milon Dikes, MD Neurologist [facility] Pager: [phone]
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
## 2021-05-31 NOTE — Procedures (Signed)
Patient Name: Alex [name]  MRN: [id number]  Epilepsy Attending: Charlsie Quest  Referring Physician/Provider: Dr Brooke Dare Date: 05/31/2021 Duration: 25.33 mins  Patient history: 25 year old male with posteriors epilepsy presented with breakthrough seizure in the setting of undetectable antiseizure medications.  EEG evaluate for seizures.  Level of alertness: Comatose  AEDs during EEG study: Keppra, Depakote, gabapentin, propofol  Technical aspects: This EEG study was done with scalp electrodes positioned according to the 10-20 International system of electrode placement. Electrical activity was acquired at a sampling rate of 500Hz  and reviewed with a high frequency filter of 70Hz  and a low frequency filter of 1Hz . EEG data were recorded continuously and digitally stored.   Description: EEG showed continuous generalized and lateralized left hemisphere complicated 2 to 3 Hz delta slowing as well as intermittent 4 to 5 Hz theta slowing seen in right hemisphere.  Right more than left frontal spikes were also noted.  Hyperventilation and photic stimulation were not performed.     ABNORMALITY -Spike, right more than left frontal region -Continuous slow, generalized and lateralized left hemisphere  IMPRESSION: This study showed evidence of epileptogenicity arising from right>left frontal region. Additionally there is evidence of cortical dysfunction in left hemisphere likely secondary to underlying structural abnormality. Additionally there is severe diffuse encephalopathy, nonspecific etiology but could be secondary to sedation.  No seizures were seen throughout the recording.  Sharyl Panchal 

## 2021-05-31 NOTE — Progress Notes (Signed)
eLink Physician-Brief Progress Note Patient Name: TYTON ABDALLAH DOB: Mar 09, 1996 MRN: [id number]   Date of Service  05/31/2021  HPI/Events of Note  Agitation   eICU Interventions  Plan: Increase ceiling on Propofol IV infusion to 80 mcg/kg/min.     Intervention Category Major Interventions: Delirium, psychosis, severe agitation - evaluation and management  Kayani Rapaport Eugene 05/31/2021, 10:42 PM
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
## 2021-05-31 NOTE — Progress Notes (Signed)
EEG complete - results pending 
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
## 2021-05-31 NOTE — Consult Note (Signed)
WOC Nurse Consult Note: Patient receiving care in [facility] [phone] Patient is intubated and sedated. Has a PEG tube. Bedside RN states it is a new PEG tube that was recently replaced.  Reason for Consult: Left side/hip inflamed area Wound type: MASD/ITD of unknown origin on the left flank area Pressure Injury POA: NA Measurement: Approx. 9 cm x 8 cm and is wet with a yeast appearance Wound bed: Pink/white moist Drainage (amount, consistency, odor)  Periwound: Intact Dressing procedure/placement/frequency: Triamcinolone ointment had previously been ordered by Marcelle Smiling, MD. Clean the area with normal saline, pat dry. Apply a thin film of the Triamcinolone ointment, cover with a moistened saline gauze, dry gauze and secure with foam dressing. Apply BID. I will order Desitin cream to be used if the affected area becomes to wet and soupy. This may also be used for the sacral and groin area if needed.  ICD-10 CM Codes for Irritant Dermatitis L24A0 - Due to friction or contact with body fluids; unspecified  Pressure Injury Prevention Bundle Support surfaces (air mattress) chair cushion [place] # [phone]) Heel offloading boots [place] # [phone]) Turning and Positioning  Measures to reduce shear (draw sheet, knees up) Skin protection Products (Foam dressing) Moisture management products (Critic-Aid Barrier Cream (Purple top) Nutrition Management Protection for Medical Devices Routine Skin Assessment   Monitor the wound area(s) for worsening of condition such as: Signs/symptoms of infection, increase in size, development of or worsening of odor, development of pain, or increased pain at the affected locations.   Notify the medical team if any of these develop.  Thank you for the consult. WOC nurse will not follow at this time.   Please re-consult the WOC team if needed.  Renaldo Reel Katrinka Blazing, MSN, RN, CMSRN, Angus Seller, [facility] Wound Treatment Associate Pager [phone]

## 2021-05-31 NOTE — Progress Notes (Addendum)
eLink Physician-Brief Progress Note Patient Name: Alex [name] DOB: May 25, 1996 MRN: [id number]   Date of Service  05/31/2021  HPI/Events of Note  K+ = 4.2, Mg++ = 1.8 and Creatinine = 0.75.   eICU Interventions  Plan: Will D/C KCl in LR IV fluid. Replace Mg++.     Intervention Category Major Interventions: Electrolyte abnormality - evaluation and management  Tilman Mcclaren Eugene 05/31/2021, 3:12 AM

## 2021-05-31 NOTE — Progress Notes (Signed)
RT placed bite block per RN request due to pt biting down on tube. Tube placement checked, bite block secured. RT will cont to monitor.

## 2021-06-01 ENCOUNTER — Inpatient Hospital Stay (HOSPITAL_COMMUNITY): Payer: Medicaid Other

## 2021-06-01 DIAGNOSIS — G40901 Epilepsy, unspecified, not intractable, with status epilepticus: Secondary | ICD-10-CM | POA: Diagnosis not present

## 2021-06-01 LAB — BASIC METABOLIC PANEL
Anion gap: 10 (ref 5–15)
BUN: 8 mg/dL (ref 6–20)
CO2: 24 mmol/L (ref 22–32)
Calcium: 8.9 mg/dL (ref 8.9–10.3)
Chloride: 106 mmol/L (ref 98–111)
Creatinine, Ser: 0.72 mg/dL (ref 0.61–1.24)
GFR, Estimated: >60 mL/min (ref >60)
Glucose, Bld: 102 mg/dL — ABNORMAL HIGH (ref 70–99)
Potassium: 3.4 mmol/L — ABNORMAL LOW (ref 3.5–5.1)
Sodium: 140 mmol/L (ref 135–145)

## 2021-06-01 LAB — URINE CULTURE: Culture: NO GROWTH

## 2021-06-01 LAB — PHOSPHORUS
Phosphorus: 3 mg/dL (ref 2.5–4.6)
Phosphorus: 2.9 mg/dL (ref 2.5–4.6)

## 2021-06-01 LAB — GLUCOSE, CAPILLARY
Glucose-Capillary: 97 mg/dL (ref 70–99)
Glucose-Capillary: 84 mg/dL (ref 70–99)
Glucose-Capillary: 105 mg/dL — ABNORMAL HIGH (ref 70–99)
Glucose-Capillary: 140 mg/dL — ABNORMAL HIGH (ref 70–99)
Glucose-Capillary: 135 mg/dL — ABNORMAL HIGH (ref 70–99)

## 2021-06-01 LAB — CBC
HCT: 37.2 % — ABNORMAL LOW (ref 39.0–52.0)
Hemoglobin: 12.2 g/dL — ABNORMAL LOW (ref 13.0–17.0)
MCH: 31.4 pg (ref 26.0–34.0)
MCHC: 32.8 g/dL (ref 30.0–36.0)
MCV: 95.6 fL (ref 80.0–100.0)
Platelets: 324 10*3/uL (ref 150–400)
RBC: 3.89 MIL/uL — ABNORMAL LOW (ref 4.22–5.81)
RDW: 12.4 % (ref 11.5–15.5)
WBC: 9.1 10*3/uL (ref 4.0–10.5)
nRBC: 0 % (ref 0.0–0.2)

## 2021-06-01 LAB — TRIGLYCERIDES: Triglycerides: 112 mg/dL (ref <150)

## 2021-06-01 LAB — MAGNESIUM
Magnesium: 1.9 mg/dL (ref 1.7–2.4)
Magnesium: 2.2 mg/dL (ref 1.7–2.4)

## 2021-06-01 MED ORDER — MIDAZOLAM HCL 2 MG/2ML IJ SOLN
1.0000 mg | INTRAMUSCULAR | Status: DC | PRN
  Administered 2021-06-01 – 2021-06-02 (×3): 1 mg via INTRAVENOUS
  Administered 2021-06-02: 2 mg via INTRAVENOUS
  Filled 2021-06-01 (×4): qty 2

## 2021-06-01 MED ORDER — FENTANYL CITRATE (PF) 100 MCG/2ML IJ SOLN
25.0000 ug | INTRAMUSCULAR | Status: DC | PRN
  Administered 2021-06-01 (×2): 100 ug via INTRAVENOUS
  Filled 2021-06-01 (×2): qty 2

## 2021-06-01 MED ORDER — CHLORHEXIDINE GLUCONATE 0.12 % MT SOLN
OROMUCOSAL | Status: AC
  Administered 2021-06-01: 15 mL via OROMUCOSAL
  Filled 2021-06-01: qty 15

## 2021-06-01 MED ORDER — IPRATROPIUM-ALBUTEROL 0.5-2.5 (3) MG/3ML IN SOLN: 3.0000 mL | RESPIRATORY_TRACT | Status: DC | PRN

## 2021-06-01 MED ORDER — POLYETHYLENE GLYCOL 3350 17 G PO PACK: 17.0000 g | PACK | Freq: Every day | ORAL | Status: DC | PRN

## 2021-06-01 MED ORDER — POTASSIUM CHLORIDE 20 MEQ PO PACK
40.0000 meq | PACK | Freq: Once | ORAL | Status: AC
  Administered 2021-06-01: 40 meq
  Filled 2021-06-01: qty 2

## 2021-06-01 MED ORDER — DOCUSATE SODIUM 50 MG/5ML PO LIQD: 100.0000 mg | Freq: Two times a day (BID) | ORAL | Status: DC | PRN

## 2021-06-01 MED ORDER — MIDAZOLAM HCL 2 MG/2ML IJ SOLN: 1.0000 mg | INTRAMUSCULAR | Status: DC | PRN

## 2021-06-01 MED ORDER — MAGNESIUM SULFATE 2 GM/50ML IV SOLN
2.0000 g | Freq: Once | INTRAVENOUS | Status: AC
  Administered 2021-06-01: 2 g via INTRAVENOUS
  Filled 2021-06-01: qty 50

## 2021-06-01 NOTE — Progress Notes (Signed)
eLink Physician-Brief Progress Note Patient Name: Alex [name] DOB: Mar 09, 1996 MRN: [id number]   Date of Service  06/01/2021  HPI/Events of Note  Agitation - Fentanyl IV not effective. Video assessment: Patient not trying to get OOB. Can use already ordered Versed IV PRN judiciously.   eICU Interventions  Plan: D/C Fentanyl IV PRN. Decrease already ordered Versed to 1-2 mg IV Q 1 hour PRN. Nurse instructed to only use when absolutely necessary.     Intervention Category Major Interventions: Delirium, psychosis, severe agitation - evaluation and management  Alex [name] 06/01/2021, 11:04 PM

## 2021-06-01 NOTE — Significant Event (Signed)
Updated Lorrin Goodell III's mother, Hilario Quarry, over the phone at [phone] on her sons condition and care.   Gershon Mussel., MSN, APRN, AGACNP-BC [facility]  06/01/2021 , 10:35 AM  Please see [email or web address] for pager details  If no response, please call [phone] After hours, please call Elink at [phone]

## 2021-06-01 NOTE — Progress Notes (Signed)
eLink Physician-Brief Progress Note Patient Name: Alex [name] DOB: 23-Sep-1996 MRN: [id number]   Date of Service  06/01/2021  HPI/Events of Note  Agitation - Nursing request for bilateral soft wrist restraints.  eICU Interventions  Will order bilateral soft wrist restraints X 12 hours.      Intervention Category Major Interventions: Delirium, psychosis, severe agitation - evaluation and management  Maitland Lesiak Eugene 06/01/2021, 9:01 PM

## 2021-06-01 NOTE — Progress Notes (Signed)
Neurology Progress Note  S: Patient is unable to participate in ROS due to intubation.  Propofol turned off for exam.   O: Current vital signs: BP 101/71 (BP Location: Left Arm)   Pulse (!) 105   Temp 99.32 F (37.4 C)   Resp 18   Ht $R'5\' 9"'sx$  (1.753 m)   Wt 51.2 kg   SpO2 100%   BMI 16.67 kg/m  Vital signs in last 24 hours: Temp:  [96.62 F (35.9 C)-99.5 F (37.5 C)] 99.32 F (37.4 C) (08/10 0700) Pulse Rate:  [85-109] 105 (08/10 0700) Resp:  [11-22] 18 (08/10 0723) BP: (94-143)/(63-98) 101/71 (08/10 0723) SpO2:  [99 %-100 %] 100 % (08/10 0752) FiO2 (%):  [30 %] 30 % (08/10 0723)  GENERAL: Acutely ill appearing male lying in ICU bed.  HEENT: Normocephalic and atraumatic. LUNGS: ETT on ventilator.  CV: RRR on tele.  Ext: warm.  NEURO: Off Propofol.  Mental Status: Unresponsive male. Follows no commands. Speech/Language: mute secondary to ETT.  Cranial Nerves:  II: PERRL.  III, IV, VI: Roving eye movements.  V, VII: No reaction to brow pressure. + corneal reflexes.  VII: Smile is symmetrical. Able to puff cheeks and raise eyebrows.  VIII: No response to voice, name calling, or loud clapping.  IX, X: + cough with suctioning.  XI: Head is grossly midline.  XII: ETT in place.  Motor:  Contractures noted to left side. No movement of right side.  No reaction to tactile stimulation or noxious stimuli.  Tone is rigid on left side.  Sensation- no reaction to noxious stimuli.  Coordination: No clonus, twitching, or tremors noted.   Gait- deferred on bedrest.   Medications  Current Facility-Administered Medications:    0.9 %  sodium chloride infusion, , Intravenous, Continuous, Renee Pain, MD, Last Rate: 10 mL/hr at 06/01/21 1000, Infusion Verify at 06/01/21 1000   baclofen (OZOBAX) 1 mg/mL oral solution 10 mg, 10 mg, Per Tube, TID, Bhagat, Srishti L, MD, 10 mg at 06/01/21 1000   chlorhexidine gluconate (MEDLINE KIT) (PERIDEX) 0.12 % solution 15 mL, 15 mL, Mouth  Rinse, BID, Renee Pain, MD, 15 mL at 06/01/21 0737   Chlorhexidine Gluconate Cloth 2 % PADS 6 each, 6 each, Topical, Q0600, Renee Pain, MD, 6 each at 06/01/21 0200   docusate (COLACE) 50 MG/5ML liquid 100 mg, 100 mg, Per Tube, BID, Renee Pain, MD, 100 mg at 06/01/21 1000   docusate (COLACE) 50 MG/5ML liquid 100 mg, 100 mg, Oral, BID PRN, Paytes, Austin A, RPH   famotidine (PEPCID) IVPB 20 mg premix, 20 mg, Intravenous, Q12H, Renee Pain, MD, Last Rate: 100 mL/hr at 06/01/21 1002, 20 mg at 06/01/21 1002   feeding supplement (OSMOLITE 1.5 CAL) liquid 1,000 mL, 1,000 mL, Per Tube, Continuous, Audria Nine, DO, Last Rate: 45 mL/hr at 06/01/21 1012, Rate Change at 06/01/21 1012   feeding supplement (PROSource TF) liquid 45 mL, 45 mL, Per Tube, BID, Ruthann Cancer, Jessica, DO, 45 mL at 06/01/21 1000   gabapentin (NEURONTIN) 250 MG/5ML solution 400 mg, 400 mg, Per Tube, Q8H, Bhagat, Srishti L, MD, 400 mg at 06/01/21 0547   heparin injection 5,000 Units, 5,000 Units, Subcutaneous, Q8H, Renee Pain, MD, 5,000 Units at 06/01/21 0548   ipratropium-albuterol (DUONEB) 0.5-2.5 (3) MG/3ML nebulizer solution 3 mL, 3 mL, Nebulization, Q6H, Renee Pain, MD, 3 mL at 06/01/21 3875   lactated ringers infusion, , Intravenous, Continuous, Anders Simmonds, MD, Last Rate: 50 mL/hr at 06/01/21 1000, Infusion Verify at 06/01/21 1000  levETIRAcetam (KEPPRA) IVPB 1000 mg/100 mL premix, 1,000 mg, Intravenous, Q12H, Bhagat, Srishti L, MD, Stopped at 06/01/21 0416   liver oil-zinc oxide (DESITIN) 40 % ointment, , Topical, BID, Renee Pain, MD, Given at 06/01/21 1003   MEDLINE mouth rinse, 15 mL, Mouth Rinse, 10 times per day, Renee Pain, MD, 15 mL at 06/01/21 1002   midazolam (VERSED) injection 2 mg, 2 mg, Intravenous, Q1H PRN, Anders Simmonds, MD, 2 mg at 06/01/21 0001   polyethylene glycol (MIRALAX / GLYCOLAX) packet 17 g, 17 g, Per Tube, Daily, Renee Pain, MD, 17 g at 06/01/21  1001   polyethylene glycol (MIRALAX / GLYCOLAX) packet 17 g, 17 g, Per Tube, Daily PRN, Paytes, Austin A, RPH   propofol (DIPRIVAN) 1000 MG/100ML infusion, 5-80 mcg/kg/min, Intravenous, Continuous, Anders Simmonds, MD, Stopped at 06/01/21 0810   propranolol (INDERAL) tablet 10 mg, 10 mg, Per Tube, TID, Renee Pain, MD, 10 mg at 06/01/21 1000   triamcinolone ointment (KENALOG) 0.1 %, , Topical, BID, Renee Pain, MD, Given at 05/31/21 2204   valproic acid (DEPAKENE) 250 MG/5ML solution 250 mg, 250 mg, Per Tube, Q8H, Renee Pain, MD, 250 mg at 06/01/21 0547  Imaging R/p [facility] 06/01/21.  Chronic atrophic and ischemic changes similar to that noted on the prior exam. No acute abnormality noted. Diffuse mucosal thickening within the paranasal sinuses. No air-fluid levels are seen on this exam.  Assessment: Unfortunate young male with a history of TBI and seizures. Presented to ED after 4 witnessed GTC seizures at SNF. His AED levels were undetectable which was interesting as he lives in a SNF. He was started on Keppra until Depakote level is therapeutic. His r/p CTH was negative for acute and similar to previous [place].   Impression: -Known seizure disorder in setting of remote TBI.   Recommendations/Plan:  -Continue Keppra until VA level is normal.  -Recheck VA level in a.m.  -Continue Neurontin.  -Continue Depakote per tube.  -Wean or completely d/c Propofol or other sedative medications.   Pt seen by Clance Boll, MSN, APN-BC/Nurse Practitioner/Neuro and later by MD. Note and plan to be edited as needed by MD.  Pager: [phone]   Attending addendum Patient seen and examined in the ICU Has been on minimal sedation-was given some sedation for CT 7 hours ago. No acute changes reported overnight On examination, he is very somnolent, opens eyes but does not track the examiner or attempt any interaction.  He is unable to follow any commands.  Pupils are equal round reactive to  light, has roving eye movements, does not blink to threat consistently from either side.  Facial symmetry difficult to ascertain due to the endotracheal tube.  He has spastic left hemiparesis with minimal grimace to noxious stimulation and no withdrawal.  There is mild grimace to noxious stimulation on the right side with questionable withdrawal. Unclear of the baseline. Labs reveal normal sodium, mild hypokalemia, glucose 102, normal renal function, no leukocytosis. CT head was personally reviewed by me: No evidence of acute infarction hemorrhage hydrocephalus or mass.  Chronic atrophic and white matter ischemic changes are noted.  Changes of prior bilateral MCA infarctions are noted diffuse mucosal thickening within the paranasal sinuses.   EEG with right more than left frontal spikes and continuous generalized slowing as well as l continuous lateralized slowing of the left hemisphere No seizures were seen throughout the recording.   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 Resumed on Keppra and Depakote. Eventual goal will be to continue Depakote monotherapy.   Status epilepticus seems to have resolved based on EEG findings   Recommendations Supportive management for primary team Check Depakote level-if therapeutic, can discontinue Keppra at that time. Continue home dose of Neurontin. Can resume home amantadine I would hold off on the Seroquel for now   Discussed plan with Dr. Ruthann Cancer on the unit   -- Amie Portland, MD Neurologist [facility] Pager: [phone]   [place] Performed by: Amie Portland, MD Total critical care time: 33 minutes Critical care time was exclusive of separately billable procedures and treating other patients and/or supervising APPs/Residents/Students Critical  care was necessary to treat or prevent imminent or life-threatening deterioration due to, status epilepticus This patient is critically ill and at significant risk for neurological worsening and/or death and care requires constant monitoring. Critical care was time spent personally by me on the following activities: development of treatment plan with patient and/or surrogate as well as nursing, discussions with consultants, evaluation of patient's response to treatment, examination of patient, obtaining history from patient or surrogate, ordering and performing treatments and interventions, ordering and review of laboratory studies, ordering and review of radiographic studies, pulse oximetry, re-evaluation of patient's condition, participation in multidisciplinary rounds and medical decision making of high complexity in the care of this patient.

## 2021-06-01 NOTE — Progress Notes (Signed)
RT transported pt from 3M04 to CT and back w/o complications w/ Rn x1 and transport x1 . RT will cont to monitor.
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
## 2021-06-01 NOTE — Progress Notes (Signed)
[facility] ADULT ICU REPLACEMENT PROTOCOL   The patient does apply for the [facility] Adult ICU Electrolyte Replacment Protocol based on the criteria listed below:   1.Exclusion criteria: TCTS patients, ECMO patients and Hypothermia Protocol, and   Dialysis patients 2. Is GFR >/= 30 ml/min? Yes.    Patient's GFR today is >60 3. Is SCr </= 2? Yes.   Patient's SCr is 0.72 mg/dL 4. Did SCr increase >/= 0.5 in 24 hours? No. 5.Pt's weight >40kg  Yes.   6. Abnormal electrolyte(s): K 3.4, Mg 1.9  7. Electrolytes replaced per protocol   [place] 06/01/2021 5:35 AM

## 2021-06-01 NOTE — Progress Notes (Signed)
Pt ETT currently at 22 @ the lip. Unable to adjust tube due to pt agitation and clamping down on the ETT. Sat currently 98% will attempt to move tube again once pt becomes more relaxed. RT will cont to monitor.

## 2021-06-01 NOTE — Progress Notes (Signed)
eLink Physician-Brief Progress Note Patient Name: Alex [name] DOB: January 11, 1996 MRN: [id number]   Date of Service  06/01/2021  HPI/Events of Note  Agitation - Propofol IV infusion stopped. Patient more alert and agitated.   eICU Interventions  Plan: Fentanyl 25-100 mcg IV Q 2 hours PRN pain, sedation or agitation.     Intervention Category Major Interventions: Delirium, psychosis, severe agitation - evaluation and management  Orilla Templeman [place] 06/01/2021, 8:30 PM

## 2021-06-02 DIAGNOSIS — Z978 Presence of other specified devices: Secondary | ICD-10-CM | POA: Diagnosis not present

## 2021-06-02 LAB — BASIC METABOLIC PANEL
Anion gap: 8 (ref 5–15)
BUN: 11 mg/dL (ref 6–20)
CO2: 24 mmol/L (ref 22–32)
Calcium: 8.5 mg/dL — ABNORMAL LOW (ref 8.9–10.3)
Chloride: 110 mmol/L (ref 98–111)
Creatinine, Ser: 0.6 mg/dL — ABNORMAL LOW (ref 0.61–1.24)
GFR, Estimated: >60 mL/min (ref >60)
Glucose, Bld: 147 mg/dL — ABNORMAL HIGH (ref 70–99)
Potassium: 3.7 mmol/L (ref 3.5–5.1)
Sodium: 142 mmol/L (ref 135–145)

## 2021-06-02 LAB — GLUCOSE, CAPILLARY
Glucose-Capillary: 102 mg/dL — ABNORMAL HIGH (ref 70–99)
Glucose-Capillary: 106 mg/dL — ABNORMAL HIGH (ref 70–99)
Glucose-Capillary: 117 mg/dL — ABNORMAL HIGH (ref 70–99)
Glucose-Capillary: 123 mg/dL — ABNORMAL HIGH (ref 70–99)
Glucose-Capillary: 125 mg/dL — ABNORMAL HIGH (ref 70–99)
Glucose-Capillary: 131 mg/dL — ABNORMAL HIGH (ref 70–99)
Glucose-Capillary: 141 mg/dL — ABNORMAL HIGH (ref 70–99)

## 2021-06-02 LAB — PHOSPHORUS: Phosphorus: 3.1 mg/dL (ref 2.5–4.6)

## 2021-06-02 LAB — CBC
HCT: 33.6 % — ABNORMAL LOW (ref 39.0–52.0)
Hemoglobin: 11 g/dL — ABNORMAL LOW (ref 13.0–17.0)
MCH: 31.1 pg (ref 26.0–34.0)
MCHC: 32.7 g/dL (ref 30.0–36.0)
MCV: 94.9 fL (ref 80.0–100.0)
Platelets: 281 10*3/uL (ref 150–400)
RBC: 3.54 MIL/uL — ABNORMAL LOW (ref 4.22–5.81)
RDW: 12.5 % (ref 11.5–15.5)
WBC: 6.6 10*3/uL (ref 4.0–10.5)
nRBC: 0 % (ref 0.0–0.2)

## 2021-06-02 LAB — VALPROIC ACID LEVEL: Valproic Acid Lvl: 27 ug/mL — ABNORMAL LOW (ref 50.0–100.0)

## 2021-06-02 LAB — CALCIUM, IONIZED: Calcium, Ionized, Serum: 5 mg/dL (ref 4.5–5.6)

## 2021-06-02 LAB — MAGNESIUM: Magnesium: 1.9 mg/dL (ref 1.7–2.4)

## 2021-06-02 LAB — SARS CORONAVIRUS 2 (TAT 6-24 HRS): SARS Coronavirus 2: NEGATIVE

## 2021-06-02 MED ORDER — MAGNESIUM SULFATE 2 GM/50ML IV SOLN
2.0000 g | Freq: Once | INTRAVENOUS | Status: AC
  Administered 2021-06-02: 2 g via INTRAVENOUS
  Filled 2021-06-02: qty 50

## 2021-06-02 MED ORDER — SODIUM BICARBONATE 8.4 % IV SOLN
INTRAVENOUS | Status: AC
  Filled 2021-06-02: qty 200

## 2021-06-02 MED ORDER — VALPROIC ACID 250 MG/5ML PO SOLN
750.0000 mg | Freq: Once | ORAL | Status: AC
  Administered 2021-06-02: 750 mg
  Filled 2021-06-02: qty 15

## 2021-06-02 MED ORDER — LEVETIRACETAM 100 MG/ML PO SOLN
1000.0000 mg | Freq: Two times a day (BID) | ORAL | Status: DC
  Administered 2021-06-02 – 2021-06-03 (×2): 1000 mg
  Filled 2021-06-02 (×2): qty 10

## 2021-06-02 MED ORDER — QUETIAPINE FUMARATE 50 MG PO TABS
50.0000 mg | ORAL_TABLET | Freq: Every morning | ORAL | Status: DC
  Administered 2021-06-03: 50 mg via ORAL
  Filled 2021-06-02: qty 1

## 2021-06-02 MED ORDER — QUETIAPINE FUMARATE 100 MG PO TABS
100.0000 mg | ORAL_TABLET | Freq: Every day | ORAL | Status: DC
  Administered 2021-06-02: 100 mg via ORAL
  Filled 2021-06-02: qty 1

## 2021-06-02 MED ORDER — POTASSIUM CHLORIDE 20 MEQ PO PACK
40.0000 meq | PACK | Freq: Once | ORAL | Status: AC
  Administered 2021-06-02: 40 meq
  Filled 2021-06-02: qty 2

## 2021-06-02 MED ORDER — VALPROIC ACID 250 MG/5ML PO SOLN
500.0000 mg | Freq: Three times a day (TID) | ORAL | Status: DC
  Administered 2021-06-02 – 2021-06-06 (×13): 500 mg
  Filled 2021-06-02 (×16): qty 10

## 2021-06-02 MED ORDER — ORAL CARE MOUTH RINSE
15.0000 mL | Freq: Two times a day (BID) | OROMUCOSAL | Status: DC
  Administered 2021-06-02 – 2021-06-06 (×9): 15 mL via OROMUCOSAL

## 2021-06-02 MED ORDER — HALOPERIDOL LACTATE 5 MG/ML IJ SOLN
2.0000 mg | Freq: Once | INTRAMUSCULAR | Status: AC
  Administered 2021-06-02: 2 mg via INTRAVENOUS
  Filled 2021-06-02: qty 1

## 2021-06-02 MED ORDER — FAMOTIDINE 20 MG PO TABS
20.0000 mg | ORAL_TABLET | Freq: Two times a day (BID) | ORAL | Status: DC
  Administered 2021-06-02 – 2021-06-06 (×8): 20 mg
  Filled 2021-06-02 (×9): qty 1

## 2021-06-02 NOTE — TOC Initial Note (Signed)
Transition of Care [facility]) - Initial/Assessment Note    Patient Details  Name: Alex [name] MRN: [id number] Date of Birth: [birth year]  [facility] CM/SW Contact:    Erin Sons, LCSW Phone Number: 06/02/2021, 1:58 PM  Clinical Narrative:                  Pt is from [employer]. Spoke with [facility] liaison and pt can return tomorrow. Will need new covid test and to be off restraints for 24 hours.  CSW called and updated pt mother.    Expected Discharge Plan: Skilled Nursing Facility     Patient Goals and CMS Choice        Expected Discharge Plan and Services Expected Discharge Plan: Skilled Nursing Facility       Living arrangements for the past 2 months: Skilled Nursing Facility                                      Prior Living Arrangements/Services Living arrangements for the past 2 months: Skilled Nursing Facility Lives with:: Facility Resident          Need for Family Participation in Patient Care: Yes (Comment) Care giver support system in place?: No (comment)      Activities of Daily Living      Permission Sought/Granted                  Emotional Assessment         Alcohol / Substance Use: Not Applicable Psych Involvement: No (comment)  Admission diagnosis:  Status epilepticus (HCC) [G40.901] Patient Active Problem List   Diagnosis Date Noted   Status epilepticus (HCC) 05/31/2021   Endotracheally intubated 05/31/2021   TBI (traumatic brain injury) (HCC) 05/31/2021   Hypokalemia 05/31/2021   Hypocalcemia 05/31/2021   Hypomagnesemia 05/31/2021   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

## 2021-06-02 NOTE — Progress Notes (Signed)
Neurology Progress Note  S:  Patient is unable to participate in ROS due to intubation.  He is off all continuous sedative infusions, but just received Versed 52m about 30 mins prior to exam. Patient was agitated during night unresponsive to Fentanyl, so Fentanyl discontinued and Versed still on prn, but PCCM asked RNs to give as little as possible.   O: Current vital signs: BP (!) 141/85   Pulse 93   Temp (!) 100.4 F (38 C)   Resp 14   Ht _0  (1.753 m)   Wt 52 kg   SpO2 98%   BMI 16.93 kg/m  Vital signs in last 24 hours: Temp:  [99.14 F (37.3 C)-101.1 F (38.4 C)] 100.4 F (38 C) (08/11 0600) Pulse Rate:  [67-118] 93 (08/11 0909) Resp:  [12-23] 14 (08/11 0600) BP: (110-149)/(69-97) 141/85 (08/11 0909) SpO2:  [98 %-100 %] 98 % (08/11 0747) FiO2 (%):  [30 %] 30 % (08/11 0747) Weight:  [52 kg] 52 kg (08/11 0339)  GENERAL: Acutely ill appearing male lying in ICU bed.  HEENT: Normocephalic and atraumatic. LUNGS: ETT on ventilator.  CV: RRR on tele.  Ext: warm.  NEURO:  Mental Status: Awake, tracks examiner on the right inconsistently, follows no commands. Speech/Language: mute secondary to ETT.  Cranial Nerves:  II: PERRL.  III, IV, VI: Roving eye movements.  V, VII: No reaction to brow pressure. + corneal reflexes.  VIII: No response to voice, name calling, or loud clapping.  IX, X: + cough with suctioning.  XI: Head is grossly midline.  XII: ETT in place.  Motor:  Contractures noted to left side. Moving both legs spontaneously. Moves left arm spontaneously. Moves head side to side constantly.  Tone is rigid on left side.  Sensation- no reaction to noxious stimuli.  Coordination: No clonus, twitching, or tremors noted.   Gait- deferred on bedrest.    Medications  Current Facility-Administered Medications:    0.9 %  sodium chloride infusion, , Intravenous, Continuous, JRenee Pain MD, Last Rate: 10 mL/hr at 06/02/21 0600, Infusion Verify at 06/02/21 0600    baclofen (OZOBAX) 1 mg/mL oral solution 10 mg, 10 mg, Per Tube, TID, Bhagat, Srishti L, MD, 10 mg at 06/02/21 0909   chlorhexidine gluconate (MEDLINE KIT) (PERIDEX) 0.12 % solution 15 mL, 15 mL, Mouth Rinse, BID, JRenee Pain MD, 15 mL at 06/02/21 0859   Chlorhexidine Gluconate Cloth 2 % PADS 6 each, 6 each, Topical, Q0600, JRenee Pain MD, 6 each at 06/01/21 2230   docusate (COLACE) 50 MG/5ML liquid 100 mg, 100 mg, Per Tube, BID, JRenee Pain MD, 100 mg at 06/02/21 0908   docusate (COLACE) 50 MG/5ML liquid 100 mg, 100 mg, Oral, BID PRN, Paytes, Austin A, RPH   famotidine (PEPCID) IVPB 20 mg premix, 20 mg, Intravenous, Q12H, JRenee Pain MD, Last Rate: 100 mL/hr at 06/02/21 0911, 20 mg at 06/02/21 0911   feeding supplement (OSMOLITE 1.5 CAL) liquid 1,000 mL, 1,000 mL, Per Tube, Continuous, MAudria Nine DO, Last Rate: 45 mL/hr at 06/01/21 2156, 1,000 mL at 06/01/21 2156   feeding supplement (PROSource TF) liquid 45 mL, 45 mL, Per Tube, BID, MRuthann Cancer Jessica, DO, 45 mL at 06/02/21 0909   gabapentin (NEURONTIN) 250 MG/5ML solution 400 mg, 400 mg, Per Tube, Q8H, Bhagat, Srishti L, MD, 400 mg at 06/02/21 0532   heparin injection 5,000 Units, 5,000 Units, Subcutaneous, Q8H, JRenee Pain MD, 5,000 Units at 06/02/21 0532   ipratropium-albuterol (DUONEB) 0.5-2.5 (3) MG/3ML nebulizer solution 3  mL, 3 mL, Nebulization, Q4H PRN, Audria Nine, DO   levETIRAcetam (KEPPRA) IVPB 1000 mg/100 mL premix, 1,000 mg, Intravenous, Q12H, Bhagat, Srishti L, MD, Stopped at 06/02/21 0342   liver oil-zinc oxide (DESITIN) 40 % ointment, , Topical, BID, Renee Pain, MD, 1 application at 09/64/38 0912   magnesium sulfate IVPB 2 g 50 mL, 2 g, Intravenous, Once, Anders Simmonds, MD   MEDLINE mouth rinse, 15 mL, Mouth Rinse, 10 times per day, Renee Pain, MD, 15 mL at 06/02/21 0911   midazolam (VERSED) injection 1-2 mg, 1-2 mg, Intravenous, Q1H PRN, Anders Simmonds, MD, 1 mg at  06/02/21 0912   polyethylene glycol (MIRALAX / GLYCOLAX) packet 17 g, 17 g, Per Tube, Daily, Renee Pain, MD, 17 g at 06/02/21 0910   polyethylene glycol (MIRALAX / GLYCOLAX) packet 17 g, 17 g, Per Tube, Daily PRN, Paytes, Austin A, RPH   propranolol (INDERAL) tablet 10 mg, 10 mg, Per Tube, TID, Renee Pain, MD, 10 mg at 06/02/21 0909   sodium bicarbonate 1 mEq/mL injection, , , ,    triamcinolone ointment (KENALOG) 0.1 %, , Topical, BID, Renee Pain, MD, 1 application at 38/18/40 0912   valproic acid (DEPAKENE) 250 MG/5ML solution 250 mg, 250 mg, Per Tube, Q8H, Renee Pain, MD, 250 mg at 06/02/21 0532  Pertinent Labs VA level low at 27. Leukocytosis trending downward.   No new imaging  Assessment: Unfortunate young male with a history of TBI and seizures. Presented to ED after 4 witnessed GTC seizures at SNF. His AED levels were undetectable which was interesting as he lives in a SNF. He was started on Keppra until Depakote level is therapeutic. His r/p CTH was negative for acute and similar to previous [place]. He continues on Keppra until [place] level is normal. VA level has gone from undetectable to 27, which would be consistent with him not receiving his medications or correct doses at SNF.   Impression: -GTC seizures with history of seizure disorder.  -Hx TBI.   Recommendations/Plan:  -Continue Keppra until [place] level is normal.  -Recheck VA level in a.m.  -Continue Neurontin.  -Continue Depakote per tube with goal of VA monotherapy.   -Continue to give lowest dose of Versed as possible.   Pt seen by Clance Boll, MSN, APN-BC/Nurse Practitioner/Neuro and later by MD. Note and plan to be edited as needed by MD.  Pager: [phone]  Attending addendum   Patient seen and examined. Agree with history and physical above   Assessment as above  Recommendations: Attempt to extubate Depakote level remains subtherapeutic at 27. Will do additional p.o. dose given IV  Depakote shortage. Pharmacy consulted-we will manage getting Depakote therapeutic.  Once Depakote is therapeutic, can discontinue Keppra. Can resume home amantadine I would continue to hold Seroquel as it might of contributed to his drowsiness  Discussed with Dr. Ruthann Cancer in the unit   -- Amie Portland, MD Neurologist [facility] Pager: [phone]  [place] Performed by: Amie Portland, MD Total critical care time: 34 minutes Critical care time was exclusive of separately billable procedures and treating other patients and/or supervising APPs/Residents/Students Critical care was necessary to treat or prevent imminent or life-threatening deterioration due to seizure, status epilepticus This patient is critically ill and at significant risk for neurological worsening and/or death and care requires constant monitoring. Critical care was time spent personally by me on the following activities: development of treatment plan with patient and/or surrogate as well as nursing, discussions with consultants, evaluation  of patient's response to treatment, examination of patient, obtaining history from patient or surrogate, ordering and performing treatments and interventions, ordering and review of laboratory studies, ordering and review of radiographic studies, pulse oximetry, re-evaluation of patient's condition, participation in multidisciplinary rounds and medical decision making of high complexity in the care of this patient.

## 2021-06-02 NOTE — Progress Notes (Signed)
[facility] ADULT ICU REPLACEMENT PROTOCOL   The patient does apply for the [facility] Adult ICU Electrolyte Replacment Protocol based on the criteria listed below:   1.Exclusion criteria: TCTS patients, ECMO patients and Hypothermia Protocol, and   Dialysis patients 2. Is GFR >/= 30 ml/min? Yes.    Patient's GFR today is >60 3. Is SCr </= 2? Yes.   Patient's SCr is 0.60 mg/dL 4. Did SCr increase >/= 0.5 in 24 hours? No. 5.Pt's weight >40kg  Yes.   6. Abnormal electrolyte(s): K 3.7, Mg 1.9  7. Electrolytes replaced per protocol   Ardelle Park 06/02/2021 6:48 AM
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
## 2021-06-02 NOTE — Procedures (Signed)
Extubation Procedure Note  Patient Details:   Name: Alex [name] DOB: 1996-09-14 MRN: [id number]   Airway Documentation:    Vent end date: 06/02/21 Vent end time: 1230   Evaluation  O2 sats: stable throughout Complications: No apparent complications Patient did tolerate procedure well. Bilateral Breath Sounds: Clear, Diminished, Rhonchi   Yes  Pt was extubated per MD order and placed on 3L [place]. Cuff leak was noted prior to extubation and no stridor post. Pt is stable at this time. RT will monitor.   Merlene Laughter 06/02/2021, 12:33 PM

## 2021-06-03 LAB — GLUCOSE, CAPILLARY
Glucose-Capillary: 120 mg/dL — ABNORMAL HIGH (ref 70–99)
Glucose-Capillary: 109 mg/dL — ABNORMAL HIGH (ref 70–99)
Glucose-Capillary: 88 mg/dL (ref 70–99)
Glucose-Capillary: 133 mg/dL — ABNORMAL HIGH (ref 70–99)
Glucose-Capillary: 107 mg/dL — ABNORMAL HIGH (ref 70–99)

## 2021-06-03 LAB — VALPROIC ACID LEVEL: Valproic Acid Lvl: 74 ug/mL (ref 50.0–100.0)

## 2021-06-03 MED ORDER — ENOXAPARIN SODIUM 40 MG/0.4ML IJ SOSY
40.0000 mg | PREFILLED_SYRINGE | INTRAMUSCULAR | Status: DC
  Administered 2021-06-03 – 2021-06-06 (×4): 40 mg via SUBCUTANEOUS
  Filled 2021-06-03 (×4): qty 0.4

## 2021-06-03 MED ORDER — DANTROLENE SODIUM 25 MG PO CAPS
50.0000 mg | ORAL_CAPSULE | Freq: Two times a day (BID) | ORAL | Status: DC
  Filled 2021-06-03 (×2): qty 2

## 2021-06-03 MED ORDER — QUETIAPINE FUMARATE 100 MG PO TABS
100.0000 mg | ORAL_TABLET | Freq: Every day | ORAL | Status: DC
  Administered 2021-06-03 – 2021-06-05 (×3): 100 mg
  Filled 2021-06-03 (×3): qty 1

## 2021-06-03 MED ORDER — TRIAMCINOLONE ACETONIDE 0.1 % EX OINT: TOPICAL_OINTMENT | Freq: Two times a day (BID) | CUTANEOUS | 0 refills | Status: DC

## 2021-06-03 MED ORDER — POLYETHYLENE GLYCOL 3350 17 G PO PACK: 17.0000 g | PACK | Freq: Every day | ORAL | 0 refills | Status: DC

## 2021-06-03 MED ORDER — VALPROIC ACID 250 MG/5ML PO SOLN: 500.0000 mg | Freq: Three times a day (TID) | ORAL | 0 refills | Status: DC

## 2021-06-03 MED ORDER — QUETIAPINE FUMARATE 50 MG PO TABS
50.0000 mg | ORAL_TABLET | Freq: Every morning | ORAL | Status: DC
  Administered 2021-06-04 – 2021-06-06 (×3): 50 mg
  Filled 2021-06-03 (×4): qty 1

## 2021-06-03 NOTE — Progress Notes (Signed)
Neurology Progress Note  Brief HPI: 25 y.o. male with PMHx of TBI 10/10/2019 complicated by epilepsy on home Depakote who presented to the ED 05/30/2021 s/p 4 witnessed GTC seizures at SNF with concern for ongoing seizures with right hand and face twitching. Work up revealed undetectable valproic acid level.   Subjective: No acute overnight events  Patient extubated yesterday without incident, not on sedation.  Home Seroquel initiated.  Exam: Vitals:   06/03/21 0400 06/03/21 0600  BP: 99/66 110/73  Pulse: 76 100  Resp: 13 15  Temp:    SpO2: 96% 95%   Gen: Awake, laying comfortably in bed, in no acute distress Resp: non-labored breathing, no respiratory distress, SpO2 96% on room air Abd: soft, non-distended  Neuro: Mental Status: Awake with eyes open.  Does not follow commands, does not vocalize.  Does not fixate or track examiner or other visual stimuli.  Cranial Nerves: PERRL, EOMI with intermittent roving eye movements- does not fixate or track examiner, face appears symmetric at rest and with grimace, unable to assess hearing 2/2 patient's condition, head is grossly midline, patient does not vocalize, does protrude tongue to command Motor: Contractures noted to the left with minimal withdraw to noxious stimuli in the upper and lower extremities, right upper and lower extremities with spontaneous movement and brisk withdraw to noxious stimuli. Tone increased on the left with contractures, right lower extremity with increased tone, normal tone on the right upper extremity. Bulk is decreased throughout.  Sensory: Grimaces with application of noxious stimuli throughout each extremity DTR: Sustained clonus with ankle dorsiflexion bilaterally Plantars: Right upgoing, left downgoing Gait: Deferred  Pertinent Labs: CBC    Component Value Date/Time   WBC 6.6 06/02/2021 0514   RBC 3.54 (L) 06/02/2021 0514   HGB 11.0 (L) 06/02/2021 0514   HCT 33.6 (L) 06/02/2021 0514   PLT 281  06/02/2021 0514   MCV 94.9 06/02/2021 0514   MCH 31.1 06/02/2021 0514   MCHC 32.7 06/02/2021 0514   RDW 12.5 06/02/2021 0514   LYMPHSABS 0.5 (L) 05/30/2021 2004   MONOABS 0.3 05/30/2021 2004   EOSABS 0.1 05/30/2021 2004   BASOSABS 0.0 05/30/2021 2004   CMP     Component Value Date/Time   NA 142 06/02/2021 0514   K 3.7 06/02/2021 0514   CL 110 06/02/2021 0514   CO2 24 06/02/2021 0514   GLUCOSE 147 (H) 06/02/2021 0514   BUN 11 06/02/2021 0514   CREATININE 0.60 (L) 06/02/2021 0514   CALCIUM 8.5 (L) 06/02/2021 0514   PROT 6.0 (L) 05/31/2021 0139   ALBUMIN 3.4 (L) 05/31/2021 0139   AST 27 05/31/2021 0139   ALT 28 05/31/2021 0139   ALKPHOS 64 05/31/2021 0139   BILITOT 1.1 05/31/2021 0139   GFRNONAA >60 06/02/2021 0514   Lab Results  Component Value Date   VALPROATE 27 (L) 06/02/2021   Imaging Reviewed:  CT Head without contrast 06/01/2021: Chronic atrophic and ischemic changes similar to that noted on the prior exam. No acute abnormality noted. Diffuse mucosal thickening within the paranasal sinuses. No air-fluid levels are seen on this exam.  Assessment: Unfortunate young male with a history of TBI and seizures who presented to the ED after 4 witnessed GTC seizures at SNF. His AED levels were undetectable which was interesting as he lives in a SNF. He was started on Keppra until Depakote level is therapeutic. His r/p CTH was negative for acute and similar to previous CTH. He continues on Keppra until [place] level is  normal. VA level has gone from undetectable --> 27 --> therapeutic today at 74, which would be consistent with him not receiving his medications or correct doses at SNF.   Impression:  -GTC seizures with history of epilepsy -History of Traumatic Brain Injury  Recommendations: - Valproic Acid level therapeutic at 74 today - Okay to discontinue Keppra in the setting of therapeutic valproic acid level - Continue Depakote per tube  - Continue Neurontin - Okay to  continue Seroquel with improvement in level of alertness s/p extubation - Continue inpatient seizure precautions - Neurology will be available on an as needed basis for further questions or concerns  Lanae Boast, AGACNP-BC [facility] [phone]   Attending Neurohospitalist Addendum Patient seen and examined with APP/Resident. Agree with the history and physical as documented above. Agree with the plan as documented, which I helped formulate. I have independently reviewed the chart, obtained history, review of systems and examined the patient.I have personally reviewed pertinent head/neck/spine imaging (CT/MRI). Please feel free to call with any questions. -- Milon Dikes, MD Neurologist [facility] Pager: [phone]

## 2021-06-03 NOTE — Progress Notes (Signed)
Patient transferred from ICU to [phone].  Patient is awake, but does not follow commands. Telemetry monitoring enabled, vital signs taken, and IVs assessed for patency. Skin checked with Maxine Glenn. Fall precautions initiated. Patient bed in the locked, lowest position. Non-slip socks in place and bed alarm on. Call bell is within reach. Patient is laying comfortably in bed.

## 2021-06-03 NOTE — Progress Notes (Signed)
[facility] Progress Note  Patient: Alex [name]    KXF:[id number]  DOA: 05/31/2021     Date of Service: the patient was seen and examined on 06/03/2021  Brief hospital course: Past medical history of traumatic brain injury, seizures, chronic contractures, at SNF, PEG tube on tube feeding.  Presents with complaints of seizure at SNF required intubation for airway protection. 8/9 Presented to [facility] with Seizure, intubated for airway protection. TXR to [facility]. No seizure on EEG. UA Neg 8/10 CTH negative for acute changes 8/11 Some issues with agitation overnight requiring versed pushes.  Extubated on 8/11. 8/12 transferred to [facility], started back on Seroquel. Currently plan is monitor for stability.  Subjective: Tracking appropriately in the room.  Nonverbal.  Unable to any commands.  Occasionally makes some sounds.  No nausea no vomiting.  No acute events overnight.  Assessment and Plan: 1.  Status epilepticus. Traumatic brain injury. History of seizure disorder. Admitted to the ICU initially.  Required intubation. Neurology was consulted. His Depakote level was undetectable at the time of admission. SNF was not able to give the medication as patient had peg tube dysfunction. Patient had prior admission with similar undetectable value with Keppra in recent past. It appears that the patient was on Depakote liquid solution and not sprinkles. Currently Depakote levels are therapeutic. Neurology recommends to continue Depakote, discontinue Keppra, continue Neurontin.  2.  Acute metabolic encephalopathy Secondary to seizure disorder. Will still be postictal and will have a slow improvement. Resuming Seroquel.  Monitor. At baseline the patient is verbal, unable to follow any commands.  Unable to carry on a conversation.  3.  Spasticity On baclofen.  Currently continue. Patient is also on dantrolene although has not received this medication in the hospital.  We will discontinue on  discharge.  4.  PEG tube malfunction Moderate protein calorie malnutrition Unintentional weight loss. Reportedly PEG tube was dislodged at SNF.  At the time of admission with intubation and OG was inserted. X-ray confirms position of the PEG tube. Patient is on 120 mL of med Pass 2.0 3 times daily which only provides 720 kcal and 30 g of protein. 13.2% weight loss in the last 2 months. Currently on Osmolite 1.5, 1700 kcal 90 g of protein along with Prosource. So far no evidence of refeeding.  Will monitor electrolytes.  5.  Agitation Patient is on Seroquel at SNF.  We will continue and monitor.  6.  Hypokalemia. Replaced.  7.  On Inderal. Hypotension. Patient's blood pressure is soft patient is on Inderal.  I am not able to identify the indication of this medication. Currently will hold in the setting of hypotension.  Monitor on telemetry.  Scheduled Meds:  baclofen  10 mg Per Tube TID   Chlorhexidine Gluconate Cloth  6 each Topical Q0600   docusate  100 mg Per Tube BID   enoxaparin (LOVENOX) injection  40 mg Subcutaneous Q24H   famotidine  20 mg Per Tube BID   feeding supplement (PROSource TF)  45 mL Per Tube BID   gabapentin  400 mg Per Tube Q8H   liver oil-zinc oxide   Topical BID   mouth rinse  15 mL Mouth Rinse BID   polyethylene glycol  17 g Per Tube Daily   QUEtiapine  100 mg Per Tube QHS   [START ON 06/04/2021] QUEtiapine  50 mg Per Tube q AM   triamcinolone ointment   Topical BID   valproic acid  500 mg Per Tube Q8H  Continuous Infusions:  sodium chloride 10 mL/hr at 06/02/21 1800   feeding supplement (OSMOLITE 1.5 CAL) 1,000 mL (06/02/21 2123)   PRN Meds: ipratropium-albuterol, polyethylene glycol  Body mass index is 16.93 kg/m.  Nutrition Problem: Moderate Malnutrition Etiology: chronic illness     DVT Prophylaxis:   enoxaparin (LOVENOX) injection 40 mg Start: 06/03/21 1700 SCDs Start: 05/31/21 0102    Advance goals of care discussion: Pt is Full  code.  Family Communication: NO family was present at bedside, at the time of interview.   Data Reviewed: I have personally reviewed and interpreted daily labs, tele strips, imaging. Valproic acid level 74.  CBG normal.  Physical Exam:  General: Appear in mild distress, NO Rash; Oral Mucosa Clear, dry. no Abnormal Neck Mass Or lumps, Conjunctiva normal  Cardiovascular: S1 and S2 Present, NO Murmur, Respiratory: good respiratory effort, Bilateral Air entry present and CTA, NO Crackles, NO wheezes Abdomen: Bowel Sound present, Soft and NO tenderness Extremities: TRACE Pedal edema Neurology: alert and not oriented to time, place, and person affect flat in affect.,  Nonverbal, no new focal deficit Gait not checked due to patient safety concerns  Vitals:   06/03/21 0930 06/03/21 1000 06/03/21 1030 06/03/21 1100  BP:      Pulse: 72 93 97 99  Resp: 14 17 14 12   Temp:    97.8 F (36.6 C)  TempSrc:    Oral  SpO2: 98% 96% 97% 96%  Weight:      Height:        Disposition:  Status is: Inpatient  Remains inpatient appropriate because:Altered mental status and IV treatments appropriate due to intensity of illness or inability to take PO  Dispo: The patient is from: SNF              Anticipated d/c is to: SNF              Patient currently is not medically stable to d/c.   Difficult to place patient No        Time spent: 35 minutes. I reviewed all nursing notes, pharmacy notes, vitals, pertinent old records. I have discussed plan of care as described above with RN.  Author: , MD [facility] 06/03/2021 5:04 PM  To reach On-call, see care teams to locate the attending and reach out via [email or web address]. Between 7PM-7AM, please contact night-coverage If you still have difficulty reaching the attending provider, please page the [facility] (Director on Call) for [facility] on amion for assistance.
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
## 2021-06-04 LAB — BASIC METABOLIC PANEL
Anion gap: 9 (ref 5–15)
BUN: 7 mg/dL (ref 6–20)
CO2: 25 mmol/L (ref 22–32)
Calcium: 9.1 mg/dL (ref 8.9–10.3)
Chloride: 104 mmol/L (ref 98–111)
Creatinine, Ser: 0.48 mg/dL — ABNORMAL LOW (ref 0.61–1.24)
GFR, Estimated: >60 mL/min (ref >60)
Glucose, Bld: 111 mg/dL — ABNORMAL HIGH (ref 70–99)
Potassium: 4.2 mmol/L (ref 3.5–5.1)
Sodium: 138 mmol/L (ref 135–145)

## 2021-06-04 LAB — GLUCOSE, CAPILLARY
Glucose-Capillary: 105 mg/dL — ABNORMAL HIGH (ref 70–99)
Glucose-Capillary: 109 mg/dL — ABNORMAL HIGH (ref 70–99)
Glucose-Capillary: 118 mg/dL — ABNORMAL HIGH (ref 70–99)
Glucose-Capillary: 122 mg/dL — ABNORMAL HIGH (ref 70–99)
Glucose-Capillary: 74 mg/dL (ref 70–99)
Glucose-Capillary: 90 mg/dL (ref 70–99)

## 2021-06-04 LAB — CBC
HCT: 38.6 % — ABNORMAL LOW (ref 39.0–52.0)
Hemoglobin: 12.8 g/dL — ABNORMAL LOW (ref 13.0–17.0)
MCH: 31.4 pg (ref 26.0–34.0)
MCHC: 33.2 g/dL (ref 30.0–36.0)
MCV: 94.8 fL (ref 80.0–100.0)
Platelets: 270 10*3/uL (ref 150–400)
RBC: 4.07 MIL/uL — ABNORMAL LOW (ref 4.22–5.81)
RDW: 12.1 % (ref 11.5–15.5)
WBC: 5.5 10*3/uL (ref 4.0–10.5)
nRBC: 0 % (ref 0.0–0.2)

## 2021-06-04 LAB — MAGNESIUM: Magnesium: 2.1 mg/dL (ref 1.7–2.4)

## 2021-06-04 LAB — PHOSPHORUS: Phosphorus: 4.2 mg/dL (ref 2.5–4.6)

## 2021-06-04 MED ORDER — OSMOLITE 1.5 CAL PO LIQD: 237.0000 mL | Freq: Every day | ORAL | 0 refills | Status: DC

## 2021-06-04 MED ORDER — FREE WATER
200.0000 mL | Freq: Three times a day (TID) | Status: DC
  Administered 2021-06-04 – 2021-06-06 (×7): 200 mL

## 2021-06-04 MED ORDER — BACLOFEN 1 MG/ML ORAL SUSPENSION: 10.0000 mg | Freq: Three times a day (TID) | ORAL | 0 refills | Status: DC

## 2021-06-04 MED ORDER — MELATONIN 3 MG PO TABS: 6.0000 mg | ORAL_TABLET | Freq: Every day | ORAL | 0 refills | Status: DC

## 2021-06-04 MED ORDER — GABAPENTIN 250 MG/5ML PO SOLN: 400.0000 mg | Freq: Three times a day (TID) | ORAL | 0 refills | Status: DC

## 2021-06-04 MED ORDER — OSMOLITE 1.5 CAL PO LIQD
237.0000 mL | Freq: Every day | ORAL | Status: DC
  Administered 2021-06-04 – 2021-06-06 (×10): 237 mL
  Filled 2021-06-04 (×13): qty 237

## 2021-06-04 MED ORDER — OSMOLITE 1.5 CAL PO LIQD: 1000.0000 mL | ORAL | 0 refills | Status: DC

## 2021-06-04 MED ORDER — PROSOURCE TF PO LIQD: 45.0000 mL | Freq: Two times a day (BID) | ORAL | 0 refills | Status: DC

## 2021-06-04 MED ORDER — FREE WATER: 200.0000 mL | Freq: Three times a day (TID) | 0 refills | Status: DC

## 2021-06-04 NOTE — TOC Progression Note (Signed)
Transition of Care [facility]) - Progression Note    Patient Details  Name: Alex [name] MRN: [id number] Date of Birth: 1995-10-31  Transition of Care [facility]) CM/SW Contact  Cristiano Capri, [place], [place] Phone [phone] 06/04/2021, 3:31 PM  Clinical Narrative:    Phone call to Tanya-admissions director to coordinate patient's return to [facility]. Per Kenney Houseman, they are not able to take patient back today due to a covid outbreak and need to deep clean and re-arrange rooms. Possible admission tomorrow.  Transition of Care to continue to follow.   [address], LCSW Transition of Care [phone]    Expected Discharge Plan: Skilled Nursing Facility    Expected Discharge Plan and Services Expected Discharge Plan: Skilled Nursing Facility       Living arrangements for the past 2 months: Skilled Nursing Facility Expected Discharge Date: 06/04/21                                     Social Determinants of Health (SDOH) Interventions    Readmission Risk Interventions No flowsheet data found.
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
## 2021-06-04 NOTE — Progress Notes (Signed)
   06/03/21 2316  Assess: MEWS Score  Temp 98.1 F (36.7 C)  BP 120/81  Pulse Rate (!) 108  ECG Heart Rate (!) 112  Resp 14  Level of Consciousness Alert  SpO2 99 %  O2 Device Room Air  Patient Activity (if Appropriate) In bed  Assess: MEWS Score  MEWS Temp 0  MEWS Systolic 0  MEWS Pulse 2  MEWS RR 0  MEWS LOC 0  MEWS Score 2  MEWS Score Color Yellow  Assess: if the MEWS score is Yellow or Red  Were vital signs taken at a resting state? Yes  Focused Assessment No change from prior assessment  Early Detection of Sepsis Score *See Row Information* Low  MEWS guidelines implemented *See Row Information* No, previously yellow, continue vital signs every 4 hours  Treat  Pain Scale PAINAD  Breathing 0  Negative Vocalization 0  Facial Expression 0  Body Language 0  Consolability 0  PAINAD Score 0  Take Vital Signs  Increase Vital Sign Frequency  Yellow: Q 2hr X 2 then Q 4hr X 2, if remains yellow, continue Q 4hrs  Escalate  MEWS: Escalate Yellow: discuss with charge nurse/RN and consider discussing with provider and RRT  Notify: Charge Nurse/RN  Name of Charge Nurse/RN Notified Nikki RN  Date Charge Nurse/RN Notified 06/03/21  Time Charge Nurse/RN Notified 2316  Document  Patient Outcome Other (Comment) (Stable;-previously yellow due to tachycardia; continue yellow MEWS; transferred from ICU to PCU)  Progress note created (see row info) Yes
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
## 2021-06-05 LAB — GLUCOSE, CAPILLARY
Glucose-Capillary: 96 mg/dL (ref 70–99)
Glucose-Capillary: 128 mg/dL — ABNORMAL HIGH (ref 70–99)
Glucose-Capillary: 147 mg/dL — ABNORMAL HIGH (ref 70–99)
Glucose-Capillary: 108 mg/dL — ABNORMAL HIGH (ref 70–99)

## 2021-06-05 MED ORDER — ACETAMINOPHEN 160 MG/5ML PO SOLN: 650.0000 mg | Freq: Four times a day (QID) | ORAL | Status: DC | PRN
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
## 2021-06-05 NOTE — TOC Progression Note (Signed)
Transition of Care [facility]) - Progression Note    Patient Details  Name: Alex [name] MRN: [id number] Date of Birth: 1996/10/20  Transition of Care [facility]) CM/SW Contact  Verna Czech [place], [place] Phone Number: [phone] 06/05/2021, 1:30 PM  Clinical Narrative:     Return call from Kenney Houseman, Admissions Director for [facility]. They are unable to take patient back until tomorrow 06/06/21.   [address] LCSW Transition of Care [phone]     Expected Discharge Plan: Skilled Nursing Facility    Expected Discharge Plan and Services Expected Discharge Plan: Skilled Nursing Facility       Living arrangements for the past 2 months: Skilled Nursing Facility Expected Discharge Date: 06/05/21                                     Social Determinants of Health (SDOH) Interventions    Readmission Risk Interventions No flowsheet data found.

## 2021-06-05 NOTE — Progress Notes (Signed)
Pt hitting himself in chest & yelling out & holding his right leg. Nodding his head yes when nurse asked if his leg hurts. Message was sent to MD & report of the above given to oncoming nurse.

## 2021-06-06 LAB — GLUCOSE, CAPILLARY
Glucose-Capillary: 102 mg/dL — ABNORMAL HIGH (ref 70–99)
Glucose-Capillary: 79 mg/dL (ref 70–99)
Glucose-Capillary: 89 mg/dL (ref 70–99)
Glucose-Capillary: 94 mg/dL (ref 70–99)
Glucose-Capillary: 95 mg/dL (ref 70–99)

## 2021-06-06 LAB — COMPREHENSIVE METABOLIC PANEL
ALT: 25 U/L (ref 0–44)
AST: 19 U/L (ref 15–41)
Albumin: 3.2 g/dL — ABNORMAL LOW (ref 3.5–5.0)
Alkaline Phosphatase: 67 U/L (ref 38–126)
Anion gap: 10 (ref 5–15)
BUN: 13 mg/dL (ref 6–20)
CO2: 29 mmol/L (ref 22–32)
Calcium: 9.2 mg/dL (ref 8.9–10.3)
Chloride: 99 mmol/L (ref 98–111)
Creatinine, Ser: 0.6 mg/dL — ABNORMAL LOW (ref 0.61–1.24)
GFR, Estimated: >60 mL/min (ref >60)
Glucose, Bld: 77 mg/dL (ref 70–99)
Potassium: 4.5 mmol/L (ref 3.5–5.1)
Sodium: 138 mmol/L (ref 135–145)
Total Bilirubin: 0.4 mg/dL (ref 0.3–1.2)
Total Protein: 6.2 g/dL — ABNORMAL LOW (ref 6.5–8.1)

## 2021-06-06 LAB — CBC WITH DIFFERENTIAL/PLATELET
Abs Immature Granulocytes: 0.05 10*3/uL (ref 0.00–0.07)
Basophils Absolute: 0 10*3/uL (ref 0.0–0.1)
Basophils Relative: 1 %
Eosinophils Absolute: 0.3 10*3/uL (ref 0.0–0.5)
Eosinophils Relative: 4 %
HCT: 40.8 % (ref 39.0–52.0)
Hemoglobin: 13.4 g/dL (ref 13.0–17.0)
Immature Granulocytes: 1 %
Lymphocytes Relative: 13 %
Lymphs Abs: 0.9 10*3/uL (ref 0.7–4.0)
MCH: 32.1 pg (ref 26.0–34.0)
MCHC: 32.8 g/dL (ref 30.0–36.0)
MCV: 97.6 fL (ref 80.0–100.0)
Monocytes Absolute: 0.8 10*3/uL (ref 0.1–1.0)
Monocytes Relative: 12 %
Neutro Abs: 4.8 10*3/uL (ref 1.7–7.7)
Neutrophils Relative %: 69 %
Platelets: 310 10*3/uL (ref 150–400)
RBC: 4.18 MIL/uL — ABNORMAL LOW (ref 4.22–5.81)
RDW: 12 % (ref 11.5–15.5)
WBC: 6.9 10*3/uL (ref 4.0–10.5)
nRBC: 0 % (ref 0.0–0.2)

## 2021-06-06 LAB — RESP PANEL BY RT-PCR (FLU A&B, COVID) ARPGX2
Influenza A by PCR: NEGATIVE
Influenza B by PCR: NEGATIVE
SARS Coronavirus 2 by RT PCR: NEGATIVE

## 2021-06-06 MED ORDER — ACETAMINOPHEN 160 MG/5ML PO SOLN: 650.0000 mg | Freq: Four times a day (QID) | ORAL | 0 refills | Status: DC | PRN

## 2021-06-06 MED ORDER — DANTROLENE SODIUM 25 MG PO CAPS: 25.0000 mg | ORAL_CAPSULE | Freq: Every day | ORAL | 0 refills | Status: DC

## 2021-06-06 NOTE — Discharge Summary (Signed)
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 minutes  Signed:  Lynden Oxford  [facility]  06/06/2021

## 2021-06-06 NOTE — TOC Transition Note (Addendum)
Transition of Care [facility]) - CM/SW Discharge Note   Patient Details  Name: Alex [name] MRN: [id number] Date of Birth: Jun 03, 1996  [facility] CM/SW Contact:  Carley Hammed, LCSWA Phone Number: 06/06/2021, 11:53 AM   Clinical Narrative:    Pt to be transported to [facility] via [facility]. Nurse to call report to [phone]. [facility] called for transport  Final next level of care: Skilled Nursing Facility Barriers to Discharge: Barriers Resolved   Patient Goals and CMS Choice        Discharge Placement              Patient chooses bed at: [facility] Patient to be transferred to facility by: [facility] Name of family member notified: Cala Bradford Patient and family notified of of transfer: 06/06/21  Discharge Plan and Services                                     Social Determinants of Health (SDOH) Interventions     Readmission Risk Interventions No flowsheet data found.

## 2021-06-07 LAB — GLUCOSE, CAPILLARY: Glucose-Capillary: 116 mg/dL — ABNORMAL HIGH (ref 70–99)

## 2021-07-09 ENCOUNTER — Other Ambulatory Visit: Payer: Self-pay

## 2021-07-09 ENCOUNTER — Emergency Department
Admission: EM | Admit: 2021-07-09 | Discharge: 2021-07-10 | Disposition: A | Discharge status: Other Acute Inpatient Hospital | Payer: Medicaid Other | Attending: Emergency Medicine | Admitting: Emergency Medicine

## 2021-07-09 ENCOUNTER — Emergency Department: Payer: Medicaid Other

## 2021-07-09 DIAGNOSIS — R569 Unspecified convulsions: Secondary | ICD-10-CM | POA: Diagnosis present

## 2021-07-09 DIAGNOSIS — Z20822 Contact with and (suspected) exposure to covid-19: Secondary | ICD-10-CM | POA: Insufficient documentation

## 2021-07-09 DIAGNOSIS — G40901 Epilepsy, unspecified, not intractable, with status epilepticus: Secondary | ICD-10-CM | POA: Insufficient documentation

## 2021-07-09 DIAGNOSIS — Z79899 Other long term (current) drug therapy: Secondary | ICD-10-CM | POA: Diagnosis not present

## 2021-07-09 LAB — BLOOD GAS, ARTERIAL
Acid-Base Excess: 3.5 mmol/L — ABNORMAL HIGH (ref 0.0–2.0)
Bicarbonate: 24.4 mmol/L (ref 20.0–28.0)
FIO2: 40
MECHVT: 450 mL
Mechanical Rate: 18
O2 Saturation: 99.7 %
PEEP: 5 cmH2O
Patient temperature: 37
pCO2 arterial: 26 mmHg — ABNORMAL LOW (ref 32.0–48.0)
pH, Arterial: 7.58 — ABNORMAL HIGH (ref 7.350–7.450)
pO2, Arterial: 182 mmHg — ABNORMAL HIGH (ref 83.0–108.0)

## 2021-07-09 LAB — CBC WITH DIFFERENTIAL/PLATELET
Abs Immature Granulocytes: 0.03 10*3/uL (ref 0.00–0.07)
Basophils Absolute: 0 10*3/uL (ref 0.0–0.1)
Basophils Relative: 1 %
Eosinophils Absolute: 0.3 10*3/uL (ref 0.0–0.5)
Eosinophils Relative: 6 %
HCT: 41.9 % (ref 39.0–52.0)
Hemoglobin: 14.9 g/dL (ref 13.0–17.0)
Immature Granulocytes: 1 %
Lymphocytes Relative: 20 %
Lymphs Abs: 1.1 10*3/uL (ref 0.7–4.0)
MCH: 33.1 pg (ref 26.0–34.0)
MCHC: 35.6 g/dL (ref 30.0–36.0)
MCV: 93.1 fL (ref 80.0–100.0)
Monocytes Absolute: 0.4 10*3/uL (ref 0.1–1.0)
Monocytes Relative: 7 %
Neutro Abs: 3.6 10*3/uL (ref 1.7–7.7)
Neutrophils Relative %: 65 %
Platelets: 289 10*3/uL (ref 150–400)
RBC: 4.5 MIL/uL (ref 4.22–5.81)
RDW: 11.9 % (ref 11.5–15.5)
WBC: 5.4 10*3/uL (ref 4.0–10.5)
nRBC: 0 % (ref 0.0–0.2)

## 2021-07-09 LAB — COMPREHENSIVE METABOLIC PANEL
ALT: 71 U/L — ABNORMAL HIGH (ref 0–44)
AST: 49 U/L — ABNORMAL HIGH (ref 15–41)
Albumin: 3.9 g/dL (ref 3.5–5.0)
Alkaline Phosphatase: 92 U/L (ref 38–126)
Anion gap: 13 (ref 5–15)
BUN: 20 mg/dL (ref 6–20)
CO2: 27 mmol/L (ref 22–32)
Calcium: 8.9 mg/dL (ref 8.9–10.3)
Chloride: 99 mmol/L (ref 98–111)
Creatinine, Ser: 0.82 mg/dL (ref 0.61–1.24)
GFR, Estimated: >60 mL/min (ref >60)
Glucose, Bld: 104 mg/dL — ABNORMAL HIGH (ref 70–99)
Potassium: 3.9 mmol/L (ref 3.5–5.1)
Sodium: 139 mmol/L (ref 135–145)
Total Bilirubin: 0.5 mg/dL (ref 0.3–1.2)
Total Protein: 6.5 g/dL (ref 6.5–8.1)

## 2021-07-09 LAB — RESP PANEL BY RT-PCR (FLU A&B, COVID) ARPGX2
Influenza A by PCR: NEGATIVE
Influenza B by PCR: NEGATIVE
SARS Coronavirus 2 by RT PCR: NEGATIVE

## 2021-07-09 LAB — VALPROIC ACID LEVEL: Valproic Acid Lvl: 17 ug/mL — ABNORMAL LOW (ref 50.0–100.0)

## 2021-07-09 MED ORDER — FENTANYL CITRATE PF 50 MCG/ML IJ SOSY
100.0000 ug | PREFILLED_SYRINGE | INTRAMUSCULAR | Status: DC | PRN
  Administered 2021-07-10 (×2): 100 ug via INTRAVENOUS
  Filled 2021-07-09 (×2): qty 2

## 2021-07-09 MED ORDER — LORAZEPAM 2 MG/ML IJ SOLN
2.0000 mg | INTRAMUSCULAR | Status: DC | PRN
  Administered 2021-07-10 (×2): 2 mg via INTRAVENOUS
  Filled 2021-07-09 (×2): qty 1

## 2021-07-09 MED ORDER — VALPROATE SODIUM 100 MG/ML IV SOLN: 500.0000 mg | INTRAVENOUS | Status: DC

## 2021-07-09 MED ORDER — ROCURONIUM BROMIDE 50 MG/5ML IV SOLN
60.0000 mg | Freq: Once | INTRAVENOUS | Status: AC
  Administered 2021-07-09: 60 mg via INTRAVENOUS
  Filled 2021-07-09: qty 6

## 2021-07-09 MED ORDER — VALPROATE SODIUM 100 MG/ML IV SOLN
1000.0000 mg | INTRAVENOUS | Status: DC
  Filled 2021-07-09: qty 10

## 2021-07-09 MED ORDER — ETOMIDATE 2 MG/ML IV SOLN
INTRAVENOUS | Status: AC
  Filled 2021-07-09: qty 10

## 2021-07-09 MED ORDER — ETOMIDATE 2 MG/ML IV SOLN
20.0000 mg | Freq: Once | INTRAVENOUS | Status: AC
  Administered 2021-07-09: 20 mg via INTRAVENOUS

## 2021-07-09 MED ORDER — SODIUM CHLORIDE 0.9 % IV BOLUS
1000.0000 mL | Freq: Once | INTRAVENOUS | Status: AC
  Administered 2021-07-09: 1000 mL via INTRAVENOUS

## 2021-07-09 NOTE — ED Provider Notes (Signed)
[facility] Emergency Department Provider Note  ____________________________________________  Time seen: Approximately 11:29 PM  I have reviewed the triage vital signs and the nursing notes.   HISTORY  Chief Complaint Seizures    Level 5 Caveat: Portions of the History and Physical including HPI and review of systems are unable to be completely obtained due to patient being a poor historian   HPI Alex [name] is a 25 y.o. male with a history of TBI and epilepsy who was brought to the ED due to seizures today.  Seizures have been going on for about 20 minutes at this facility when EMS were called.  EMS reports giving a total of 6 mg of IM Versed during transport with some improvement in his tonic-clonic convulsive activity, but patient is still unresponsive.  No known recent fever or acute symptoms.  No trauma.  Patient's medication list and MAR accompanies him, showing compliance with regimen.  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 Authorizing Provider  baclofen (OZOBAX) 1 mg/mL SOLN oral solution Place 10 mLs (10 mg total) into feeding tube 3 (three) times daily. 06/04/21  Yes Rolly Salter, MD  cholecalciferol (VITAMIN D3) 25 MCG (1000 UNIT) tablet Take 1,000 Units by mouth daily.   Yes [provider]  dantrolene (DANTRIUM) 25 MG capsule  Take 1 capsule (25 mg total) by mouth daily. 06/06/21  Yes Rolly Salter, MD  gabapentin (NEURONTIN) 250 MG/5ML solution Place 8 mLs (400 mg total) into feeding tube every 8 (eight) hours. 06/04/21  Yes Rolly Salter, MD  melatonin 3 MG TABS tablet Place 2 tablets (6 mg total) into feeding tube at bedtime. 06/04/21  Yes Rolly Salter, MD  polyethylene glycol (MIRALAX / GLYCOLAX) 17 g packet Place 17 g into feeding tube daily. 06/04/21  Yes Rolly Salter, MD  QUEtiapine (SEROQUEL) 100 MG tablet Take 100 mg by mouth daily.   Yes [provider]  triamcinolone ointment (KENALOG) 0.1 % Apply topically 2 (two) times daily. 06/03/21  Yes Rolly Salter, MD  valproic acid (DEPAKENE) 250 MG/5ML solution Place 10 mLs (500 mg total) into feeding tube 3 (three) times daily. 06/03/21  Yes Rolly Salter, MD  zinc oxide 20 % ointment Apply 1 application topically as directed. Apply to left flank/left thigh topically every shift   Yes [provider]  acetaminophen (TYLENOL) 160 MG/5ML solution Place 20.3 mLs (650 mg total) into feeding tube every 6 (six) hours as needed for mild pain, moderate pain, headache or fever. 06/06/21   Rolly Salter, MD  Nutritional Supplements (FEEDING SUPPLEMENT, OSMOLITE 1.5 CAL,) LIQD Place 237 mLs into feeding tube 5 (five) times daily. 06/04/21   Rolly Salter, MD  Nutritional Supplements (FEEDING SUPPLEMENT, PROSOURCE TF,) liquid Place 45 mLs into feeding tube 2 (two) times daily. 06/04/21  Rolly Salter, MD  PRESCRIPTION MEDICATION 120 mLs 3 (three) times daily. Med Plus 2.0    [provider]  Water For Irrigation, Sterile (FREE WATER) SOLN Place 200 mLs into feeding tube every 8 (eight) hours. 06/04/21   Rolly Salter, MD     Allergies Patient has no known allergies.   No family history on file.  Social History Social History   Tobacco Use   Smoking status: Never   Smokeless tobacco: Never    Review of Systems Level 5 Caveat:  Portions of the History and Physical including HPI and review of systems are unable to be completely obtained due to patient being a poor historian   Constitutional:   No known fever.  ENT:   No rhinorrhea. Cardiovascular:   No chest pain or syncope. Respiratory:   No dyspnea or cough. Gastrointestinal:   Negative for abdominal pain, vomiting and diarrhea.  Musculoskeletal:   Negative for focal pain or swelling ____________________________________________   PHYSICAL EXAM:  VITAL SIGNS: ED Triage Vitals  Enc Vitals Group     BP 07/09/21 2221 (!) 92/55     Pulse Rate 07/09/21 2221 (!) 139     Resp 07/09/21 2221 15     Temp 07/09/21 2223 99.5 F (37.5 C)     Temp Source 07/09/21 2223 Oral     SpO2 07/09/21 2221 99 %     Weight 07/09/21 2228 123 lb 7.3 oz (56 kg)     Height --      Head Circumference --      Peak Flow --      Pain Score --      Pain Loc --      Pain Edu? --      Excl. in GC? --     Vital signs reviewed, nursing assessments reviewed.   Constitutional:   Comatose Eyes:   Conjunctivae are normal.  Pupils equal, 2 mm bilaterally, gaze fixed upward ENT      Head:   Normocephalic and atraumatic.      Nose:   No congestion/rhinnorhea.       Mouth/Throat:   MMM, no pharyngeal erythema. No peritonsillar mass.       Neck:   No meningismus. Full ROM.  Old healed tracheostomy scar Hematological/Lymphatic/Immunilogical:   No cervical lymphadenopathy. Cardiovascular:   Tachycardia heart rate 130. Symmetric bilateral radial and DP pulses.  No murmurs. Cap refill less than 2 seconds. Respiratory:   Normal respiratory effort without tachypnea/retractions. Breath sounds are clear and equal bilaterally. No wheezes/rales/rhonchi. Gastrointestinal:   Soft and nontender.  PEG tube in place.  Non distended.   No rebound, rigidity, or guarding. Musculoskeletal:   Chronic extremity contractures.  No wounds or deformities. Neurologic:   Intermittent twitching movements of lip and  eyelids and RUE. No response to pain.  GCS = 3 Skin:    Skin is warm, dry and intact. No rash noted.  No petechiae, purpura, or bullae.  ____________________________________________    LABS (pertinent positives/negatives) (all labs ordered are listed, but only abnormal results are displayed) Labs Reviewed  COMPREHENSIVE METABOLIC PANEL - Abnormal; Notable for the following components:      Result Value   Glucose, Bld 104 (*)    AST 49 (*)    ALT 71 (*)    All other components within normal limits  VALPROIC ACID LEVEL - Abnormal; Notable for the following components:   Valproic Acid Lvl 17 (*)    All other components  within normal limits  BLOOD GAS, ARTERIAL - Abnormal; Notable for the following components:   pH, Arterial 7.58 (*)    pCO2 arterial 26 (*)    pO2, Arterial 182 (*)    Acid-Base Excess 3.5 (*)    All other components within normal limits  RESP PANEL BY RT-PCR (FLU A&B, COVID) ARPGX2  CBC WITH DIFFERENTIAL/PLATELET  URINALYSIS, ROUTINE W REFLEX MICROSCOPIC  URINE DRUG SCREEN, QUALITATIVE ([facility] ONLY)   ____________________________________________   EKG    ____________________________________________    RADIOLOGY  DG Chest 1 View  Result Date: 07/09/2021 CLINICAL DATA:  Post intubation EXAM: CHEST  1 VIEW COMPARISON:  05/31/2021 FINDINGS: Endotracheal tube tip approximately 2.8 cm above the carina. Enteric tube tip and side port overlie the stomach. The heart size and mediastinal contours are within normal limits. Both lungs are clear. The visualized skeletal structures are unremarkable. IMPRESSION: 1. Support apparatus in appropriate position. 2. No acute cardiopulmonary process. Electronically Signed   By: Wiliam Ke M.D.   On: 07/09/2021 22:54   CT HEAD WO CONTRAST ( )  Result Date: 07/09/2021 CLINICAL DATA:  with seizure for greater than 20 minutes. Pt with history of tbi. Cerebral hemorrhage suspected EXAM: CT HEAD WITHOUT CONTRAST TECHNIQUE:  Contiguous axial images were obtained from the base of the skull through the vertex without intravenous contrast. 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 No evidence of large-territorial acute infarction. No parenchymal hemorrhage. No mass lesion. No extra-axial collection. No mass effect or midline shift. No hydrocephalus. Basilar cisterns are patent. Vascular: No hyperdense vessel. Skull: No acute fracture or focal lesion. Sinuses/Orbits: Bilateral maxillary and frontal mucosal thickening. Paranasal sinuses and mastoid air cells are clear. The orbits are unremarkable. Other: None. IMPRESSION: No acute intracranial abnormality. Electronically Signed   By: Tish Frederickson M.D.   On: 07/09/2021 23:27    ____________________________________________   PROCEDURES .Critical Care Performed by: Sharman Cheek, MD Authorized by: Sharman Cheek, MD   Critical care provider statement:    Critical care time (minutes):  35   Critical care time was exclusive of:  Separately billable procedures and treating other patients   Critical care was necessary to treat or prevent imminent or life-threatening deterioration of the following conditions:  CNS failure or compromise and respiratory failure   Critical care was time spent personally by me on the following activities:  Development of treatment plan with patient or surrogate, discussions with consultants, evaluation of patient's response to treatment, examination of patient, obtaining history from patient or surrogate, ordering and performing treatments and interventions, ordering and review of laboratory studies, ordering and review of radiographic studies, pulse oximetry, re-evaluation of patient's condition, review of old charts and ventilator management Comments:        Procedure Name: Intubation Date/Time: 07/09/2021 11:33 PM Performed by: Sharman Cheek, MD Pre-anesthesia  Checklist: Patient identified, Emergency Drugs available, Suction available and Patient being monitored Preoxygenation: Pre-oxygenation with 100% oxygen Induction Type: IV induction and Rapid sequence Ventilation: Mask ventilation without difficulty Laryngoscope Size: Glidescope and 3 Grade View: Grade I Tube size: 8.0 mm Number of attempts: 1 Placement Confirmation: ETT inserted through vocal cords under direct vision, CO2 detector and Breath sounds checked- equal and bilateral Secured at: 24 cm Tube secured with: ETT holder Dental Injury: Teeth and Oropharynx as per pre-operative assessment      ____________________________________________  DIFFERENTIAL DIAGNOSIS   Status epilepticus, subtherapeutic antiepileptics, intracranial hemorrhage, electrolyte abnormality, UTI, pneumonia, dehydration  CLINICAL IMPRESSION / ASSESSMENT AND PLAN / ED COURSE  Medications ordered in the ED: Medications  etomidate (AMIDATE) 2 MG/ML injection (has no administration in time range)  valproate (DEPACON) injection 1,000 mg (has no administration in time range)  LORazepam (ATIVAN) injection 2 mg (has no administration in time range)  fentaNYL (SUBLIMAZE) injection 100 mcg (has no administration in time range)  etomidate (AMIDATE) injection 20 mg (20 mg Intravenous Given 07/09/21 2234)  rocuronium (ZEMURON) injection 60 mg (60 mg Intravenous Given 07/09/21 2234)  sodium chloride 0.9 % bolus 1,000 mL (1,000 mLs Intravenous New Bag/Given 07/09/21 2235)    Pertinent labs & imaging results that were available during my care of the patient were reviewed by me and considered in my medical decision making (see chart for details).   Alex [name] was evaluated in Emergency Department on 07/09/2021 for the symptoms described in the history of present illness. He was evaluated in the context of the global COVID-19 pandemic, which necessitated consideration that the patient might be at risk for infection with  the SARS-CoV-2 virus that causes COVID-19. Institutional protocols and algorithms that pertain to the evaluation of patients at risk for COVID-19 are in a state of rapid change based on information released by regulatory bodies including the CDC and federal and state organizations. These policies and algorithms were followed during the patient's care in the ED.     Clinical Course as of 07/09/21 6578  Sat Jul 09, 2021  2240 Patient presents with status epilepticus going on for approximately 45 minutes on arrival, still seizing.  Intubated for airway protection.  Will obtain CT head, chest x-ray, labs, give IV fluids.  I do not think he is septic, but will look for sources of infection as a possible seizure trigger. [PS]  2251 Patient's guardian, his mother, request transfer to [facility] for necessary higher level of care for continuity of care [PS]    Clinical Course User Index [PS] Sharman Cheek, MD    ----------------------------------------- 11:34 PM on 07/09/2021 ----------------------------------------- Discussed with [facility] neurology Dr. Emmie Niemann who accepts for transfer.  Recommends IV Depakote load in the meantime.   ____________________________________________   FINAL CLINICAL IMPRESSION(S) / ED DIAGNOSES    Final diagnoses:  Status epilepticus [facility])     ED Discharge Orders     None       Portions of this note were generated with dragon dictation software. Dictation errors may occur despite best attempts at proofreading.   Sharman Cheek, MD 07/09/21 [phone]

## 2021-07-09 NOTE — ED Notes (Signed)
Covid swab sent to lab at this time.  ?

## 2021-07-09 NOTE — ED Notes (Signed)
Zach to [employer] to [facility]

## 2021-07-09 NOTE — ED Triage Notes (Signed)
Pt arrives from [facility] in status epilepticus. Pt with seizure for greater than 20 minutes. Ems gave 6mg  im versed. Pt arrives seizing. Pt with history of tbi.

## 2021-07-10 LAB — URINALYSIS, MICROSCOPIC (REFLEX)
Bacteria, UA: NONE SEEN
RBC / HPF: NONE SEEN RBC/hpf (ref 0–5)
WBC, UA: NONE SEEN WBC/hpf (ref 0–5)

## 2021-07-10 LAB — URINALYSIS, ROUTINE W REFLEX MICROSCOPIC
Bilirubin Urine: NEGATIVE
Glucose, UA: NEGATIVE mg/dL
Hgb urine dipstick: NEGATIVE
Ketones, ur: NEGATIVE mg/dL
Leukocytes,Ua: NEGATIVE
Nitrite: NEGATIVE
Protein, ur: 30 mg/dL — AB
Specific Gravity, Urine: 1.025 (ref 1.005–1.030)
pH: 7 (ref 5.0–8.0)

## 2021-07-10 LAB — URINE DRUG SCREEN, QUALITATIVE (ARMC ONLY)
Amphetamines, Ur Screen: NOT DETECTED
Barbiturates, Ur Screen: NOT DETECTED
Benzodiazepine, Ur Scrn: POSITIVE — AB
Cannabinoid 50 Ng, Ur ~~LOC~~: NOT DETECTED
Cocaine Metabolite,Ur ~~LOC~~: NOT DETECTED
MDMA (Ecstasy)Ur Screen: NOT DETECTED
Methadone Scn, Ur: NOT DETECTED
Opiate, Ur Screen: NOT DETECTED
Phencyclidine (PCP) Ur S: NOT DETECTED
Tricyclic, Ur Screen: POSITIVE — AB

## 2021-07-10 MED ORDER — VALPROATE SODIUM 100 MG/ML IV SOLN
1000.0000 mg | Freq: Once | INTRAVENOUS | Status: AC
  Administered 2021-07-10: 1000 mg via INTRAVENOUS
  Filled 2021-07-10: qty 10

## 2021-07-10 MED ORDER — MIDAZOLAM-SODIUM CHLORIDE 100-0.9 MG/100ML-% IV SOLN
1.0000 mg/h | INTRAVENOUS | Status: DC
  Administered 2021-07-10: 1 mg/h via INTRAVENOUS
  Filled 2021-07-10: qty 100

## 2021-07-10 NOTE — ED Notes (Signed)
This RN spoke with patient's mother regarding transfer to [facility]. Verification was also obtained by April B., RN. Mother verbally verified she consented to transfer and was updated on plan of care for patient. Charge nurse made aware of same.

## 2021-07-10 NOTE — ED Notes (Signed)
EMTALA Reviewed by this RN.  

## 2021-07-10 NOTE — ED Notes (Signed)
Spoke with pt's mother Hilario Quarry to notify of transfer to [facility]. Mother gives consent via telephone to transfer to [facility].

## 2021-07-10 NOTE — ED Notes (Signed)
Faxed Covid results to [facility] per [place]

## 2021-07-10 NOTE — ED Notes (Signed)
Pt accepted to [facility] ICU Rm 2723 per [place]. Call [phone] opt 2 for report. [facility] to transport.

## 2021-09-11 ENCOUNTER — Inpatient Hospital Stay
Admission: EM | Admit: 2021-09-11 | Discharge: 2021-09-13 | DRG: 101 | Disposition: A | Discharge status: Skilled Nursing Facility | Payer: Medicaid Other | Source: Skilled Nursing Facility | Attending: Internal Medicine | Admitting: Internal Medicine

## 2021-09-11 ENCOUNTER — Emergency Department: Payer: Medicaid Other

## 2021-09-11 ENCOUNTER — Encounter: Payer: Self-pay | Admitting: Radiology

## 2021-09-11 DIAGNOSIS — Z681 Body mass index (BMI) 19 or less, adult: Secondary | ICD-10-CM | POA: Diagnosis not present

## 2021-09-11 DIAGNOSIS — R739 Hyperglycemia, unspecified: Secondary | ICD-10-CM | POA: Diagnosis present

## 2021-09-11 DIAGNOSIS — Z87820 Personal history of traumatic brain injury: Secondary | ICD-10-CM

## 2021-09-11 DIAGNOSIS — S069XAA Unspecified intracranial injury with loss of consciousness status unknown, initial encounter: Secondary | ICD-10-CM | POA: Diagnosis present

## 2021-09-11 DIAGNOSIS — T17908A Unspecified foreign body in respiratory tract, part unspecified causing other injury, initial encounter: Secondary | ICD-10-CM | POA: Diagnosis present

## 2021-09-11 DIAGNOSIS — K8689 Other specified diseases of pancreas: Secondary | ICD-10-CM | POA: Diagnosis not present

## 2021-09-11 DIAGNOSIS — A419 Sepsis, unspecified organism: Secondary | ICD-10-CM | POA: Diagnosis not present

## 2021-09-11 DIAGNOSIS — Z79890 Hormone replacement therapy (postmenopausal): Secondary | ICD-10-CM | POA: Diagnosis not present

## 2021-09-11 DIAGNOSIS — K869 Disease of pancreas, unspecified: Secondary | ICD-10-CM | POA: Diagnosis present

## 2021-09-11 DIAGNOSIS — Z79899 Other long term (current) drug therapy: Secondary | ICD-10-CM | POA: Diagnosis not present

## 2021-09-11 DIAGNOSIS — E872 Acidosis, unspecified: Secondary | ICD-10-CM | POA: Diagnosis present

## 2021-09-11 DIAGNOSIS — Z20822 Contact with and (suspected) exposure to covid-19: Secondary | ICD-10-CM | POA: Diagnosis present

## 2021-09-11 DIAGNOSIS — E871 Hypo-osmolality and hyponatremia: Secondary | ICD-10-CM | POA: Diagnosis present

## 2021-09-11 DIAGNOSIS — R569 Unspecified convulsions: Secondary | ICD-10-CM | POA: Diagnosis present

## 2021-09-11 DIAGNOSIS — S069X0S Unspecified intracranial injury without loss of consciousness, sequela: Secondary | ICD-10-CM | POA: Diagnosis not present

## 2021-09-11 DIAGNOSIS — R6520 Severe sepsis without septic shock: Secondary | ICD-10-CM

## 2021-09-11 DIAGNOSIS — E440 Moderate protein-calorie malnutrition: Secondary | ICD-10-CM | POA: Diagnosis present

## 2021-09-11 DIAGNOSIS — G40109 Localization-related (focal) (partial) symptomatic epilepsy and epileptic syndromes with simple partial seizures, not intractable, without status epilepticus: Secondary | ICD-10-CM | POA: Diagnosis present

## 2021-09-11 DIAGNOSIS — R0902 Hypoxemia: Secondary | ICD-10-CM | POA: Diagnosis present

## 2021-09-11 DIAGNOSIS — R7989 Other specified abnormal findings of blood chemistry: Secondary | ICD-10-CM | POA: Diagnosis present

## 2021-09-11 DIAGNOSIS — Z931 Gastrostomy status: Secondary | ICD-10-CM | POA: Diagnosis not present

## 2021-09-11 DIAGNOSIS — R7401 Elevation of levels of liver transaminase levels: Secondary | ICD-10-CM | POA: Diagnosis present

## 2021-09-11 LAB — CBC WITH DIFFERENTIAL/PLATELET
Abs Immature Granulocytes: 0.1 10*3/uL — ABNORMAL HIGH (ref 0.00–0.07)
Basophils Absolute: 0 10*3/uL (ref 0.0–0.1)
Basophils Relative: 0 %
Eosinophils Absolute: 0.3 10*3/uL (ref 0.0–0.5)
Eosinophils Relative: 3 %
HCT: 43.7 % (ref 39.0–52.0)
Hemoglobin: 14.1 g/dL (ref 13.0–17.0)
Immature Granulocytes: 1 %
Lymphocytes Relative: 10 %
Lymphs Abs: 1 10*3/uL (ref 0.7–4.0)
MCH: 31.1 pg (ref 26.0–34.0)
MCHC: 32.3 g/dL (ref 30.0–36.0)
MCV: 96.3 fL (ref 80.0–100.0)
Monocytes Absolute: 0.6 10*3/uL (ref 0.1–1.0)
Monocytes Relative: 6 %
Neutro Abs: 7.8 10*3/uL — ABNORMAL HIGH (ref 1.7–7.7)
Neutrophils Relative %: 80 %
Platelets: 240 10*3/uL (ref 150–400)
RBC: 4.54 MIL/uL (ref 4.22–5.81)
RDW: 12.9 % (ref 11.5–15.5)
WBC: 9.7 10*3/uL (ref 4.0–10.5)
nRBC: 0 % (ref 0.0–0.2)

## 2021-09-11 LAB — COMPREHENSIVE METABOLIC PANEL
ALT: 83 U/L — ABNORMAL HIGH (ref 0–44)
AST: 89 U/L — ABNORMAL HIGH (ref 15–41)
Albumin: 3.6 g/dL (ref 3.5–5.0)
Alkaline Phosphatase: 85 U/L (ref 38–126)
Anion gap: 15 (ref 5–15)
BUN: 20 mg/dL (ref 6–20)
CO2: 18 mmol/L — ABNORMAL LOW (ref 22–32)
Calcium: 8.9 mg/dL (ref 8.9–10.3)
Chloride: 101 mmol/L (ref 98–111)
Creatinine, Ser: 0.97 mg/dL (ref 0.61–1.24)
GFR, Estimated: >60 mL/min (ref >60)
Glucose, Bld: 233 mg/dL — ABNORMAL HIGH (ref 70–99)
Potassium: 3.9 mmol/L (ref 3.5–5.1)
Sodium: 134 mmol/L — ABNORMAL LOW (ref 135–145)
Total Bilirubin: 0.6 mg/dL (ref 0.3–1.2)
Total Protein: 6.6 g/dL (ref 6.5–8.1)

## 2021-09-11 LAB — URINALYSIS, COMPLETE (UACMP) WITH MICROSCOPIC
Bilirubin Urine: NEGATIVE
Glucose, UA: NEGATIVE mg/dL
Hgb urine dipstick: NEGATIVE
Ketones, ur: 5 mg/dL — AB
Leukocytes,Ua: NEGATIVE
Nitrite: NEGATIVE
Protein, ur: NEGATIVE mg/dL
Specific Gravity, Urine: 1.024 (ref 1.005–1.030)
pH: 5 (ref 5.0–8.0)

## 2021-09-11 LAB — LACTIC ACID, PLASMA
Lactic Acid, Venous: 8.8 mmol/L (ref 0.5–1.9)
Lactic Acid, Venous: 5.4 mmol/L (ref 0.5–1.9)

## 2021-09-11 LAB — VALPROIC ACID LEVEL: Valproic Acid Lvl: 90 ug/mL (ref 50.0–100.0)

## 2021-09-11 LAB — RESP PANEL BY RT-PCR (FLU A&B, COVID) ARPGX2
Influenza A by PCR: NEGATIVE
Influenza B by PCR: NEGATIVE
SARS Coronavirus 2 by RT PCR: NEGATIVE

## 2021-09-11 LAB — PROCALCITONIN: Procalcitonin: <0.1 ng/mL

## 2021-09-11 MED ORDER — MELATONIN 5 MG PO TABS
5.0000 mg | ORAL_TABLET | Freq: Every day | ORAL | Status: DC
  Administered 2021-09-12 (×2): 5 mg
  Filled 2021-09-11 (×2): qty 1

## 2021-09-11 MED ORDER — IOHEXOL 300 MG/ML  SOLN
100.0000 mL | Freq: Once | INTRAMUSCULAR | Status: AC | PRN
  Administered 2021-09-11: 100 mL via INTRAVENOUS

## 2021-09-11 MED ORDER — SODIUM CHLORIDE 0.9 % IV SOLN: 2.0000 g | Freq: Once | INTRAVENOUS | Status: DC

## 2021-09-11 MED ORDER — OSMOLITE 1.5 CAL PO LIQD
237.0000 mL | Freq: Every day | ORAL | Status: DC
  Administered 2021-09-12 – 2021-09-13 (×8): 237 mL

## 2021-09-11 MED ORDER — ACETAMINOPHEN 325 MG PO TABS: 650.0000 mg | ORAL_TABLET | Freq: Four times a day (QID) | ORAL | Status: DC | PRN

## 2021-09-11 MED ORDER — MAGNESIUM HYDROXIDE 400 MG/5ML PO SUSP: 30.0000 mL | Freq: Every day | ORAL | Status: DC | PRN

## 2021-09-11 MED ORDER — LORAZEPAM 2 MG/ML IJ SOLN: 1.0000 mg | INTRAMUSCULAR | Status: DC | PRN

## 2021-09-11 MED ORDER — BACLOFEN 10 MG PO TABS
10.0000 mg | ORAL_TABLET | Freq: Three times a day (TID) | ORAL | Status: DC
  Administered 2021-09-12 – 2021-09-13 (×5): 10 mg
  Filled 2021-09-11 (×8): qty 1

## 2021-09-11 MED ORDER — VALPROIC ACID 250 MG/5ML PO SOLN
500.0000 mg | Freq: Three times a day (TID) | ORAL | Status: DC
  Administered 2021-09-12 (×2): 500 mg
  Filled 2021-09-11 (×5): qty 10

## 2021-09-11 MED ORDER — VANCOMYCIN HCL 1500 MG/300ML IV SOLN
1500.0000 mg | Freq: Once | INTRAVENOUS | Status: AC
  Administered 2021-09-12: 1500 mg via INTRAVENOUS
  Filled 2021-09-11: qty 300

## 2021-09-11 MED ORDER — ZINC OXIDE 20 % EX OINT
1.0000 "application " | TOPICAL_OINTMENT | Freq: Three times a day (TID) | CUTANEOUS | Status: DC
  Administered 2021-09-12 – 2021-09-13 (×4): 1 via TOPICAL
  Filled 2021-09-11: qty 28.35

## 2021-09-11 MED ORDER — ACETAMINOPHEN 650 MG RE SUPP
650.0000 mg | Freq: Four times a day (QID) | RECTAL | Status: DC | PRN
  Filled 2021-09-11: qty 1

## 2021-09-11 MED ORDER — GABAPENTIN 250 MG/5ML PO SOLN
400.0000 mg | Freq: Three times a day (TID) | ORAL | Status: DC
  Administered 2021-09-12 – 2021-09-13 (×5): 400 mg
  Filled 2021-09-11 (×12): qty 8

## 2021-09-11 MED ORDER — LACTATED RINGERS IV BOLUS (SEPSIS)
1000.0000 mL | Freq: Once | INTRAVENOUS | Status: AC
  Administered 2021-09-11: 1000 mL via INTRAVENOUS

## 2021-09-11 MED ORDER — SODIUM CHLORIDE 0.9 % IV SOLN
2.0000 g | INTRAVENOUS | Status: DC
  Administered 2021-09-11: 2 g via INTRAVENOUS
  Filled 2021-09-11: qty 20

## 2021-09-11 MED ORDER — ACETAMINOPHEN 500 MG PO TABS
ORAL_TABLET | ORAL | Status: AC
  Filled 2021-09-11: qty 2

## 2021-09-11 MED ORDER — ONDANSETRON HCL 4 MG/2ML IJ SOLN: 4.0000 mg | Freq: Four times a day (QID) | INTRAMUSCULAR | Status: DC | PRN

## 2021-09-11 MED ORDER — POLYETHYLENE GLYCOL 3350 17 G PO PACK
17.0000 g | PACK | Freq: Every day | ORAL | Status: DC
  Administered 2021-09-12 – 2021-09-13 (×2): 17 g
  Filled 2021-09-11 (×2): qty 1

## 2021-09-11 MED ORDER — ENOXAPARIN SODIUM 40 MG/0.4ML IJ SOSY
40.0000 mg | PREFILLED_SYRINGE | INTRAMUSCULAR | Status: DC
  Administered 2021-09-12 – 2021-09-13 (×2): 40 mg via SUBCUTANEOUS
  Filled 2021-09-11 (×2): qty 0.4

## 2021-09-11 MED ORDER — PROSOURCE TF PO LIQD
45.0000 mL | Freq: Two times a day (BID) | ORAL | Status: DC
  Administered 2021-09-12 – 2021-09-13 (×3): 45 mL
  Filled 2021-09-11 (×4): qty 45

## 2021-09-11 MED ORDER — ONDANSETRON HCL 4 MG PO TABS: 4.0000 mg | ORAL_TABLET | Freq: Four times a day (QID) | ORAL | Status: DC | PRN

## 2021-09-11 MED ORDER — LACTATED RINGERS IV BOLUS (SEPSIS)
250.0000 mL | Freq: Once | INTRAVENOUS | Status: AC
  Administered 2021-09-11: 250 mL via INTRAVENOUS

## 2021-09-11 MED ORDER — SODIUM CHLORIDE 0.9 % IV SOLN
500.0000 mg | INTRAVENOUS | Status: DC
  Administered 2021-09-11: 500 mg via INTRAVENOUS
  Filled 2021-09-11: qty 500

## 2021-09-11 MED ORDER — VITAMIN D 25 MCG (1000 UNIT) PO TABS
1000.0000 [IU] | ORAL_TABLET | Freq: Every day | ORAL | Status: DC
  Administered 2021-09-12: 1000 [IU] via ORAL
  Filled 2021-09-11: qty 1

## 2021-09-11 MED ORDER — QUETIAPINE FUMARATE 25 MG PO TABS
100.0000 mg | ORAL_TABLET | Freq: Every day | ORAL | Status: DC
  Administered 2021-09-12: 100 mg
  Filled 2021-09-11: qty 4

## 2021-09-11 MED ORDER — TRIAMCINOLONE ACETONIDE 0.1 % EX OINT
TOPICAL_OINTMENT | Freq: Two times a day (BID) | CUTANEOUS | Status: DC
  Filled 2021-09-11: qty 15

## 2021-09-11 MED ORDER — DANTROLENE SODIUM 25 MG PO CAPS
25.0000 mg | ORAL_CAPSULE | Freq: Every day | ORAL | Status: DC
  Administered 2021-09-12: 25 mg
  Filled 2021-09-11 (×2): qty 1

## 2021-09-11 MED ORDER — METRONIDAZOLE 500 MG/100ML IV SOLN
500.0000 mg | Freq: Two times a day (BID) | INTRAVENOUS | Status: DC
  Administered 2021-09-12: 500 mg via INTRAVENOUS
  Filled 2021-09-11: qty 100

## 2021-09-11 MED ORDER — TRAZODONE HCL 50 MG PO TABS: 50.0000 mg | ORAL_TABLET | Freq: Every evening | ORAL | Status: DC | PRN

## 2021-09-11 MED ORDER — LACTATED RINGERS IV BOLUS (SEPSIS)
500.0000 mL | Freq: Once | INTRAVENOUS | Status: AC
  Administered 2021-09-11: 500 mL via INTRAVENOUS

## 2021-09-11 NOTE — ED Triage Notes (Signed)
EMS called out to [facility] center for a seizure tonight. Pt also meets sepsis criteria. EMS gave 2mg  of versed and pt is post- ictal at this time.

## 2021-09-11 NOTE — ED Provider Notes (Signed)
[facility] Emergency Department Provider Note  ____________________________________________  Time seen: Approximately 11:13 PM  I have reviewed the triage vital signs and the nursing notes.   HISTORY  Chief Complaint Seizures    Level 5 Caveat: Portions of the History and Physical including HPI and review of systems are unable to be completely obtained due to altered mental status  HPI Alex [name] is a 25 y.o. male with a history of TBI, seizure disorder, PEG tube sent to the ED from [facility] due to a seizure lasting 20 minutes.  Patient was still seizing when EMS arrived so they gave him Versed which resolved the seizure.  Noted to be febrile tachycardic by EMS.  They also report initial oxygen saturation of 85% on room air and they placed him on nasal cannula oxygen.    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 Authorizing Provider  acetaminophen (TYLENOL) 160 MG/5ML solution Place 20.3 mLs (650 mg total) into feeding tube every 6 (six) hours as needed for mild pain, moderate pain, headache or fever. 06/06/21   Rolly SalterPatel, Pranav M, MD  baclofen (OZOBAX) 1 mg/mL SOLN oral solution Place 10 mLs (10 mg total) into feeding tube 3 (three) times daily. 06/04/21   Rolly SalterPatel, Pranav M, MD  cholecalciferol (VITAMIN D3) 25 MCG (1000 UNIT) tablet Take 1,000 Units by mouth daily.    [provider]  dantrolene (DANTRIUM) 25 MG capsule Take 1 capsule (25 mg total) by mouth daily. 06/06/21   Rolly SalterPatel, Pranav M, MD  gabapentin (NEURONTIN) 250 MG/5ML solution Place 8 mLs (400 mg total) into feeding tube every 8 (eight) hours. 06/04/21   Rolly SalterPatel, Pranav M, MD  melatonin 3 MG TABS tablet Place 2 tablets (6 mg total) into feeding tube at bedtime. 06/04/21   Rolly SalterPatel, Pranav M, MD  Nutritional Supplements (FEEDING SUPPLEMENT, OSMOLITE 1.5 CAL,) LIQD Place 237 mLs into feeding tube 5 (five) times daily. 06/04/21   Rolly SalterPatel, Pranav M, MD  Nutritional Supplements (FEEDING SUPPLEMENT, PROSOURCE TF,) liquid Place 45 mLs into feeding tube 2 (two) times daily. 06/04/21   Rolly SalterPatel, Pranav M, MD  polyethylene glycol (MIRALAX / GLYCOLAX) 17 g packet Place 17 g into feeding tube daily. 06/04/21   Rolly SalterPatel, Pranav M, MD  PRESCRIPTION MEDICATION 120 mLs 3 (three) times daily. Med Plus 2.0    [provider]  QUEtiapine (SEROQUEL) 100 MG tablet Take 100 mg by mouth daily.    [provider]  triamcinolone ointment (KENALOG) 0.1 % Apply topically 2 (two) times daily. 06/03/21   Rolly SalterPatel, Pranav M, MD  valproic acid (DEPAKENE) 250 MG/5ML solution Place 10 mLs (500 mg total) into feeding tube 3 (three) times daily. 06/03/21   Rolly SalterPatel, Pranav M, MD  Water For Irrigation, Sterile (FREE WATER) SOLN Place 200 mLs into feeding tube every 8 (eight) hours. 06/04/21   Rolly SalterPatel, Pranav M, MD  zinc oxide  20 % ointment Apply 1 application topically as directed. Apply to left flank/left thigh topically every shift    [provider]     Allergies Patient has no known allergies.   No family history on file.  Social History Social History   Tobacco Use   Smoking status: Never   Smokeless tobacco: Never    Review of Systems Level 5 Caveat: Portions of the History and Physical including HPI and review of systems are unable to be completely obtained due to patient being a poor historian   Constitutional:  Positive fever.  ENT:   No rhinorrhea.  ____________________________________________   PHYSICAL EXAM:  VITAL SIGNS: ED Triage Vitals  Enc Vitals Group     BP 09/11/21 2018 102/61     Pulse Rate 09/11/21 2018 (!) 148     Resp 09/11/21 2018 (!) 23     Temp 09/11/21 2023 (!) 103 F (39.4 C)     Temp Source 09/11/21 2023 Rectal     SpO2 09/11/21 2018 97 %     Weight 09/11/21 2021 130 lb 1.1 oz (59 kg)     Height 09/11/21 2021 5\' 9"  (1.753 m)     Head Circumference --      Peak Flow --      Pain Score --      Pain Loc --      Pain Edu? --      Excl. in [place]? --     Vital signs reviewed, nursing assessments reviewed.   Constitutional:   Not alert, not oriented.  Ill-appearing.. Eyes:   Conjunctivae are normal. EOMI. PERRL. ENT      Head:   Normocephalic and atraumatic.      Nose:   No congestion/rhinnorhea.       Mouth/Throat:   Dry mucous membranes, no pharyngeal erythema. No peritonsillar mass.       Neck:   No meningismus. Full ROM. Hematological/Lymphatic/Immunilogical:   No cervical lymphadenopathy. Cardiovascular:   Tachycardia heart rate 140. Symmetric bilateral radial and DP pulses.  No murmurs. Cap refill less than 2 seconds. Respiratory:   Tachypnea.  Crackles in the left lung.  No wheezing. Gastrointestinal:   Soft and nontender. Non distended. There is no CVA tenderness.  No rebound, rigidity, or guarding.  PEG tube in place  Musculoskeletal:   Atrophied lower extremities with contractures.  No focal wounds or inflammatory changes of the soft tissues.  No joint effusions.. Neurologic:   Not interactive, no verbal responses currently Skin:    Skin is warm, dry and intact. No rash noted.  No petechiae, purpura, or bullae.  ____________________________________________    LABS (pertinent positives/negatives) (all labs ordered are listed, but only abnormal results are displayed) Labs Reviewed  LACTIC ACID, PLASMA - Abnormal; Notable for the following components:       Result Value   Lactic Acid, Venous 8.8 (*)    All other components within normal limits  COMPREHENSIVE METABOLIC PANEL - Abnormal; Notable for the following components:   Sodium 134 (*)    CO2 18 (*)    Glucose, Bld 233 (*)    AST 89 (*)    ALT 83 (*)    All other components within normal limits  CBC WITH DIFFERENTIAL/PLATELET - Abnormal; Notable for the following components:   Neutro Abs 7.8 (*)    Abs Immature Granulocytes 0.10 (*)    All other components within normal limits  URINALYSIS, COMPLETE (UACMP) WITH MICROSCOPIC - Abnormal; Notable for the  following components:   Color, Urine YELLOW (*)    APPearance HAZY (*)    Ketones, ur 5 (*)    Bacteria, UA RARE (*)    All other components within normal limits  RESP PANEL BY RT-PCR (FLU A&B, COVID) ARPGX2  CULTURE, BLOOD (ROUTINE X 2)  CULTURE, BLOOD (ROUTINE X 2)  URINE CULTURE  VALPROIC ACID LEVEL  PROCALCITONIN  LACTIC ACID, PLASMA  PROTIME-INR  APTT   ____________________________________________   EKG  Interpreted by me Sinus tachycardia rate 150.  Normal axis, slightly prolonged QTC of 520 ms.  Normal QRS ST segments.  ____________________________________________    RADIOLOGY  CT Head Wo Contrast  Result Date: 09/11/2021 CLINICAL DATA:  Seizure. Unknown cause. History of traumatic brain injury EXAM: CT HEAD WITHOUT CONTRAST TECHNIQUE: Contiguous axial images were obtained from the base of the skull through the vertex without intravenous contrast. COMPARISON:  CT head 07/09/2021 BRAIN: BRAIN Similar-appearing chronic encephalomalacia of bilateral frontotemporal lobes, right frontal white matter, left frontoparietal lobe. Similar-appearing enlargement of the lateral ventricles and third ventricle. Some of it likely due to ex vacuo dilatation. No evidence of large-territorial acute infarction. No parenchymal hemorrhage. No mass lesion. No extra-axial collection. No mass effect or midline shift. No hydrocephalus.  Basilar cisterns are patent. Vascular: No hyperdense vessel. Skull: No acute fracture or focal lesion. Sinuses/Orbits: Mucosal thickening of the left sphenoid, bilateral ethmoid, bilateral frontal, bilateral maxillary sinuses. The orbits are unremarkable. Other: None. IMPRESSION: 1. No acute intracranial abnormality in a patient with Similar-appearing chronic encephalomalacia of bilateral frontotemporal lobes, right frontal white matter, left frontoparietal lobe. 2. Pansinus disease. Electronically Signed   By: Iven Finn M.D.   On: 09/11/2021 22:16   CT CHEST ABDOMEN PELVIS W CONTRAST  Result Date: 09/11/2021 CLINICAL DATA:  65min seizure tonight. Pt also meets sepsis criteria. EMS gave 2mg  of versed and pt is post- ictal at this time. EXAM: CT CHEST, ABDOMEN, AND PELVIS WITH CONTRAST TECHNIQUE: Multidetector CT imaging of the chest, abdomen and pelvis was performed following the standard protocol during bolus administration of intravenous contrast. CONTRAST:  173mL OMNIPAQUE IOHEXOL 300 MG/ML  SOLN COMPARISON:  None. FINDINGS: Ports and Devices: Foley catheter with tip and balloon terminating within the urinary bladder lumen. CHEST: Lungs/airways: No focal consolidation. No pulmonary nodule. No pulmonary mass. No pulmonary contusion or laceration. No pneumatocele formation. Debris within the trachea. Pleura: No pleural effusion. No pneumothorax. No hemothorax. Lymph Nodes: No mediastinal, hilar, or axillary lymphadenopathy. Mediastinum: No pneumomediastinum. No aortic injury or mediastinal hematoma. The thoracic aorta is normal in caliber. The heart is normal in size. No significant pericardial effusion. The esophagus is unremarkable. The thyroid is unremarkable. Chest Wall / Breasts: No chest wall mass. Musculoskeletal: No acute rib or sternal fracture. No spinal fracture. Partially visualized bilateral upper extremities grossly unremarkable. ABDOMEN / PELVIS: Liver: Not enlarged. No focal lesion. No  laceration or subcapsular hematoma. Biliary System: Markedly limited evaluation due to streak artifact. Gallstones within the gallbladder lumen. No gallbladder wall thickening or pericholecystic fluid. No biliary ductal dilatation. Pancreas: Markedly limited evaluation due to streak artifact. Suggestion of a 2.4 cm hypodensity along the pancreatic head (3:60). Normal pancreatic contour. No main pancreatic duct dilatation. Spleen: Not enlarged. No focal lesion. No laceration, subcapsular hematoma, or vascular injury. Adrenal Glands: No nodularity bilaterally. Kidneys: Bilateral kidneys enhance symmetrically. Subcentimeter hypodensity within the right kidney too small to characterize. No hydronephrosis. No contusion, laceration, or subcapsular hematoma. No injury to the vascular structures or collecting systems.  No hydroureter. The urinary bladder is decompressed and grossly unremarkable. Bowel: No small or large bowel wall thickening or dilatation. Stool throughout the colon. The appendix is unremarkable. Mesentery, Omentum, and Peritoneum: No simple free fluid ascites. No pneumoperitoneum. No hemoperitoneum. No mesenteric hematoma identified. No organized fluid collection. Pelvic Organs: Normal. Lymph Nodes: No abdominal, pelvic, inguinal lymphadenopathy. Vasculature: No abdominal aorta or iliac aneurysm. No active contrast extravasation or pseudoaneurysm. Musculoskeletal: No significant soft tissue hematoma. No acute pelvic fracture. No spinal fracture. IMPRESSION: 1. Debris within the trachea. 2. A 2.4 cm hypodensity within the pancreatic head. Question choledochal cyst. Markedly limited evaluation due to streak artifact. Recommend MRI pancreatic protocol further evaluation. When the patient is clinically stable and able to follow directions and hold their breath (preferably as an outpatient) further evaluation with dedicated abdominal MRI should be considered. 3. Cholelithiasis with no CT findings of acute  cholecystitis. 4. Constipation. 5. No acute traumatic injury to the chest, abdomen, or pelvis. 6. No acute fracture or traumatic malalignment of the thoracic or lumbar spine. Electronically Signed   By: Iven Finn M.D.   On: 09/11/2021 22:29   DG Chest Port 1 View  Result Date: 09/11/2021 CLINICAL DATA:  Sepsis, seizure EXAM: PORTABLE CHEST 1 VIEW COMPARISON:  07/09/2021 FINDINGS: Single frontal view of the chest demonstrates an unremarkable cardiac silhouette. No acute airspace disease, effusion, or pneumothorax. No acute bony abnormality. IMPRESSION: 1. No acute intrathoracic process. Electronically Signed   By: Randa Ngo M.D.   On: 09/11/2021 21:06    ____________________________________________   PROCEDURES .Critical Care Performed by: Carrie Mew, MD Authorized by: Carrie Mew, MD   Critical care provider statement:    Critical care time (minutes):  35   Critical care time was exclusive of:  Separately billable procedures and treating other patients   Critical care was necessary to treat or prevent imminent or life-threatening deterioration of the following conditions:  Sepsis and CNS failure or compromise   Critical care was time spent personally by me on the following activities:  Development of treatment plan with patient or surrogate, discussions with consultants, evaluation of patient's response to treatment, examination of patient, obtaining history from patient or surrogate, ordering and performing treatments and interventions, ordering and review of laboratory studies, ordering and review of radiographic studies, pulse oximetry, re-evaluation of patient's condition and review of old charts  ____________________________________________  DIFFERENTIAL DIAGNOSIS   Pneumonia, UTI, appendicitis, colitis, cholecystitis  CLINICAL IMPRESSION / ASSESSMENT AND PLAN / ED COURSE  Medications ordered in the ED: Medications  cefTRIAXone (ROCEPHIN) 2 g in sodium  chloride 0.9 % 100 mL IVPB (0 g Intravenous Stopped 09/11/21 2311)  azithromycin (ZITHROMAX) 500 mg in sodium chloride 0.9 % 250 mL IVPB (0 mg Intravenous Stopped 09/11/21 2311)  acetaminophen (TYLENOL) 500 MG tablet (  Given 09/11/21 2050)  lactated ringers bolus 1,000 mL (0 mLs Intravenous Stopped 09/11/21 2311)    And  lactated ringers bolus 500 mL (0 mLs Intravenous Stopped 09/11/21 2311)    And  lactated ringers bolus 250 mL (0 mLs Intravenous Stopped 09/11/21 2310)  iohexol (OMNIPAQUE) 300 MG/ML solution 100 mL (100 mLs Intravenous Contrast Given 09/11/21 2135)    Pertinent labs & imaging results that were available during my care of the patient were reviewed by me and considered in my medical decision making (see chart for details).   OMMAR NOTTAGE [name] was evaluated in Emergency Department on 09/11/2021 for the symptoms described in the history of present illness. He  was evaluated in the context of the global COVID-19 pandemic, which necessitated consideration that the patient might be at risk for infection with the SARS-CoV-2 virus that causes COVID-19. Institutional protocols and algorithms that pertain to the evaluation of patients at risk for COVID-19 are in a state of rapid change based on information released by regulatory bodies including the CDC and federal and state organizations. These policies and algorithms were followed during the patient's care in the ED.   Patient presents with fever tachycardia tachypnea, seizure in the setting of baseline seizure disorder.  SNF MAR reviewed, he has been up-to-date on medication administrations.  Code sepsis initiated, empiric Rocephin with suspected pneumonia due to pulmonary exam findings  Clinical Course as of 09/11/21 2313  Sun Sep 11, 2021  2119 Chest x-ray viewed and interpreted by me, appears unremarkable, no effusion or lobar consolidation.  No pneumothorax.  Radiology report reviewed.  Labs show a lactate of 8.8 consistent with  septic shock.  Chemistries, CBC, COVID, flu, chest x-ray all unremarkable.  Will obtain CT scan of the head due to seizures and altered mental status, CT chest abdomen pelvis to look for occult infectious source. [PS]    Clinical Course User Index [PS] Sharman Cheek, MD     ----------------------------------------- 11:20 PM on 09/11/2021 ----------------------------------------- Blood pressure remained stable.  Heart rate tachycardia improving with IV fluids.  Work-up so far nonfocal, CTs do not show any acute issues.  Discussed with CCM who declines ICU admission since patient's not on pressors, blood pressures been stable, no advanced airway management needs.  Discussed with hospitalist who will provide further care. ____________________________________________   FINAL CLINICAL IMPRESSION(S) / ED DIAGNOSES    Final diagnoses:  Sepsis (HCC)  Seizures [facility])     ED Discharge Orders     None       Portions of this note were generated with dragon dictation software. Dictation errors may occur despite best attempts at proofreading.   Sharman Cheek, MD 09/11/21 [phone]

## 2021-09-11 NOTE — Sepsis Progress Note (Signed)
Elink following for Sepsis Protocol 

## 2021-09-11 NOTE — Consult Note (Signed)
CODE SEPSIS - PHARMACY COMMUNICATION  **Broad Spectrum Antibiotics should be administered within 1 hour of Sepsis diagnosis**  Time Code Sepsis Called/Page Received: 2022  Antibiotics Ordered: 2022  Time of 1st antibiotic administration: 2059  Additional action taken by pharmacy: N/A  If necessary, Name of Provider/Nurse Contacted: N/A    Derrek Gu ,PharmD Clinical Pharmacist  09/11/2021  8:24 PM

## 2021-09-11 NOTE — Progress Notes (Addendum)
Pharmacy Antibiotic Note  Alex [name] is a 25 y.o. male admitted on 09/11/2021 with sepsis from unknown source.  Pharmacy has been consulted for Cefepime and Vancomycin dosing for 7 days.  Plan: Cefepime 2 gm q8h per indication   Vancomycin Pt given initial dose of Vanc 1500 mg. Vancomycin 750 mg IV Q 12 hrs.  Goal AUC 400-550. Expected AUC: 418.7 SCr used: 0.97, TBW 59 < IBW 70.7   Pharmacy will continue to follow and adjust abx dosing when warranted.  Height: 5\' 9"  (175.3 cm) Weight: 59 kg (130 lb 1.1 oz) IBW/kg (Calculated) : 70.7  Temp (24hrs), Avg:100.6 F (38.1 C), Min:99.4 F (37.4 C), Max:103 F (39.4 C)  Recent Labs  Lab 09/11/21 2024  WBC 9.7  CREATININE 0.97  LATICACIDVEN 8.8*    Estimated Creatinine Clearance: 96.3 mL/min (by C-G formula based on SCr of 0.97 mg/dL).    No Known Allergies  Antimicrobials this admission: 11/20 Ceftriaxone >> x 1 11/20 Azithromycin >> x 5 doses 11/21 Vancomycin >> x 7 days 11/21 Cefepime >> x 7 days 11/20 Flagyl >> x 7 days  Microbiology results: 11/20 BCx: Pending 11/20 UCx: Pending   Thank you for allowing pharmacy to be a part of this patient's care.  12/20, PharmD, MBA 09/12/2021 2:10 AM

## 2021-09-12 DIAGNOSIS — K8689 Other specified diseases of pancreas: Secondary | ICD-10-CM | POA: Diagnosis present

## 2021-09-12 DIAGNOSIS — R7401 Elevation of levels of liver transaminase levels: Secondary | ICD-10-CM | POA: Diagnosis present

## 2021-09-12 DIAGNOSIS — T17908A Unspecified foreign body in respiratory tract, part unspecified causing other injury, initial encounter: Secondary | ICD-10-CM | POA: Diagnosis present

## 2021-09-12 DIAGNOSIS — R569 Unspecified convulsions: Secondary | ICD-10-CM

## 2021-09-12 DIAGNOSIS — E872 Acidosis, unspecified: Secondary | ICD-10-CM | POA: Diagnosis present

## 2021-09-12 DIAGNOSIS — R7989 Other specified abnormal findings of blood chemistry: Secondary | ICD-10-CM | POA: Diagnosis present

## 2021-09-12 LAB — BASIC METABOLIC PANEL
Anion gap: 8 (ref 5–15)
Anion gap: 3 — ABNORMAL LOW (ref 5–15)
BUN: 10 mg/dL (ref 6–20)
BUN: 8 mg/dL (ref 6–20)
CO2: 28 mmol/L (ref 22–32)
CO2: 24 mmol/L (ref 22–32)
Calcium: 8.7 mg/dL — ABNORMAL LOW (ref 8.9–10.3)
Calcium: 7.1 mg/dL — ABNORMAL LOW (ref 8.9–10.3)
Chloride: 104 mmol/L (ref 98–111)
Chloride: 110 mmol/L (ref 98–111)
Creatinine, Ser: 0.61 mg/dL (ref 0.61–1.24)
Creatinine, Ser: 0.53 mg/dL — ABNORMAL LOW (ref 0.61–1.24)
GFR, Estimated: >60 mL/min (ref >60)
GFR, Estimated: >60 mL/min (ref >60)
Glucose, Bld: 94 mg/dL (ref 70–99)
Glucose, Bld: 82 mg/dL (ref 70–99)
Potassium: 4.2 mmol/L (ref 3.5–5.1)
Potassium: 3.7 mmol/L (ref 3.5–5.1)
Sodium: 140 mmol/L (ref 135–145)
Sodium: 137 mmol/L (ref 135–145)

## 2021-09-12 LAB — LACTIC ACID, PLASMA
Lactic Acid, Venous: 2.4 mmol/L (ref 0.5–1.9)
Lactic Acid, Venous: 1.9 mmol/L (ref 0.5–1.9)

## 2021-09-12 LAB — PROTIME-INR
INR: 2.1 — ABNORMAL HIGH (ref 0.8–1.2)
Prothrombin Time: 23.8 seconds — ABNORMAL HIGH (ref 11.4–15.2)

## 2021-09-12 LAB — URINE CULTURE: Culture: NO GROWTH

## 2021-09-12 LAB — CBC
HCT: 40.5 % (ref 39.0–52.0)
Hemoglobin: 13.4 g/dL (ref 13.0–17.0)
MCH: 31.2 pg (ref 26.0–34.0)
MCHC: 33.1 g/dL (ref 30.0–36.0)
MCV: 94.4 fL (ref 80.0–100.0)
Platelets: 213 10*3/uL (ref 150–400)
RBC: 4.29 MIL/uL (ref 4.22–5.81)
RDW: 13.2 % (ref 11.5–15.5)
WBC: 11 10*3/uL — ABNORMAL HIGH (ref 4.0–10.5)
nRBC: 0 % (ref 0.0–0.2)

## 2021-09-12 LAB — APTT: aPTT: 43 seconds — ABNORMAL HIGH (ref 24–36)

## 2021-09-12 LAB — CBG MONITORING, ED: Glucose-Capillary: 72 mg/dL (ref 70–99)

## 2021-09-12 LAB — CORTISOL-AM, BLOOD: Cortisol - AM: 3 ug/dL — ABNORMAL LOW (ref 6.7–22.6)

## 2021-09-12 LAB — PROCALCITONIN
Procalcitonin: UNDETERMINED ng/mL
Procalcitonin: <0.1 ng/mL

## 2021-09-12 MED ORDER — VANCOMYCIN HCL 750 MG/150ML IV SOLN
750.0000 mg | Freq: Two times a day (BID) | INTRAVENOUS | Status: DC
  Filled 2021-09-12: qty 150

## 2021-09-12 MED ORDER — SODIUM CHLORIDE 0.9 % IV BOLUS
500.0000 mL | Freq: Once | INTRAVENOUS | Status: AC
  Administered 2021-09-12: 500 mL via INTRAVENOUS

## 2021-09-12 MED ORDER — AMOXICILLIN-POT CLAVULANATE 400-57 MG/5ML PO SUSR: 800.0000 mg | Freq: Two times a day (BID) | ORAL | Status: DC

## 2021-09-12 MED ORDER — VALPROIC ACID 250 MG/5ML PO SOLN
1000.0000 mg | Freq: Two times a day (BID) | ORAL | Status: DC
  Administered 2021-09-12 – 2021-09-13 (×2): 1000 mg
  Filled 2021-09-12 (×3): qty 20

## 2021-09-12 MED ORDER — COSYNTROPIN 0.25 MG IJ SOLR
0.2500 mg | Freq: Once | INTRAMUSCULAR | Status: AC
  Administered 2021-09-13: 0.25 mg via INTRAVENOUS
  Filled 2021-09-12 (×2): qty 0.25

## 2021-09-12 MED ORDER — SODIUM CHLORIDE 0.9 % IV SOLN
2.0000 g | Freq: Three times a day (TID) | INTRAVENOUS | Status: DC
  Administered 2021-09-12: 2 g via INTRAVENOUS
  Filled 2021-09-12: qty 2

## 2021-09-12 MED ORDER — AMOXICILLIN-POT CLAVULANATE 875-125 MG PO TABS
1.0000 | ORAL_TABLET | Freq: Two times a day (BID) | ORAL | Status: DC
  Administered 2021-09-12 – 2021-09-13 (×3): 1
  Filled 2021-09-12 (×3): qty 1

## 2021-09-12 MED ORDER — SODIUM CHLORIDE 0.9 % IV SOLN
INTRAVENOUS | Status: DC
Start: 2021-09-12 — End: 2021-09-12

## 2021-09-12 MED ORDER — DEXTROSE-NACL 5-0.9 % IV SOLN: INTRAVENOUS | Status: DC

## 2021-09-12 MED ORDER — LEVETIRACETAM 100 MG/ML PO SOLN
500.0000 mg | Freq: Two times a day (BID) | ORAL | Status: DC
  Administered 2021-09-12 (×2): 500 mg via ORAL
  Filled 2021-09-12 (×3): qty 5

## 2021-09-12 NOTE — Assessment & Plan Note (Addendum)
cause of patient's seizure.  Monitor.

## 2021-09-12 NOTE — Assessment & Plan Note (Addendum)
Patient has history of traumatic brain injury leading to recurrent seizure episodes. Currently on Depakote. Level therapeutic. Neurology consult appreciated. EEG negative for any acute seizures. Repeat Depakote level still adequate. Keppra added 500 mg bid.

## 2021-09-12 NOTE — Assessment & Plan Note (Signed)
Secondary to seizure rather than sepsis. Lactic acid cleared easily. Monitor.

## 2021-09-12 NOTE — Procedures (Signed)
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  EEG evaluate for seizure.  Level of alertness: lethargic   AEDs during EEG study: LEV, VPA, GBP  Technical aspects: This EEG study was done with scalp electrodes positioned according to the 10-20 International system of electrode placement. Electrical activity was acquired at a sampling rate of 500Hz  and reviewed with a high frequency filter of 70Hz  and a low frequency filter of 1Hz . EEG data were recorded continuously and digitally stored.   Description: EEG showed continuous generalized and lateralized left hemisphere 2 to 3 Hz delta slowing as well as intermittent 4 to 5 Hz theta slowing seen in right hemisphere.  Sharp transients were also seen in right frontal region.  Hyperventilation and photic stimulation were not performed.      ABNORMALITY -Continuous slow, generalized and lateralized left hemisphere   IMPRESSION: This study is suggestive of cortical dysfunction in left hemisphere likely secondary to underlying structural abnormality. Additionally there is moderate to severe diffuse encephalopathy, nonspecific etiology. No seizures or definite epileptiform discharges were seen throughout the recording.   [name] 

## 2021-09-12 NOTE — Assessment & Plan Note (Signed)
Continued tube feeding.

## 2021-09-12 NOTE — ED Notes (Signed)
Pt sleeping, resting comfortably in bed, NAD. No needs identified at this time. Bed low & locked.

## 2021-09-12 NOTE — ED Notes (Signed)
Secure chat sent to MD Lynden Oxford. This RN made MD aware of "EEG being completed at bedside. Patient latest blood pressure 98/53 (67). He has gotten the bolus and the continuous fluids are going. He is not very arousable. Also looks pale. He did have his scheduled seroquel earlier which may have made him sleepy but I just wanted you to know how he is presenting now."  MD reported unless map falls below 65 to let him know.

## 2021-09-12 NOTE — Assessment & Plan Note (Addendum)
Seen incidentally on the CT scan. Outpatient follow-up with pancreatic mass protocol recommended.

## 2021-09-12 NOTE — Assessment & Plan Note (Addendum)
AST 89 and ALT 83.  Likely in the setting of seizure.  Monitor. Improving

## 2021-09-12 NOTE — H&P (Signed)
[place]   PATIENT NAME: Alex [name]    MR#:  [id number]  DATE OF BIRTH:  01/06/1996  DATE OF ADMISSION:  09/11/2021  PRIMARY CARE PHYSICIAN: [facility]   Patient is coming from: [facility]  REQUESTING/REFERRING PHYSICIAN: Alfonse Flavors, MD  CHIEF COMPLAINT:   Chief Complaint  Patient presents with   Seizures    HISTORY OF PRESENT ILLNESS:  Alex [name] is a 25 y.o. Caucasian unlocked male with medical history significant for traumatic brain injury and seizure disorder, status post PEG tube, who presented to emergency room with acute onset of seizure that lasted about 20 minutes at her SNF.  This was aborted by IV Versed given by EMS.  He was noted to be febrile and tachycardic by EMS.  He was also reportedly hypoxic with pulse oximetry of 85% on room air for which she was placed on nasal cannula.  No history could be obtained from the patient as he was nonverbal.  ED Course: Upon presentation to the emergency room heart rate temperature was 103 with heart rate 132 and respiratory to of 21  with otherwise normal vital signs.  Labs revealed mild hyponatremia and hyperglycemia of 233 and lactic acid of 8.8 later came down to 5.4 with hydration.  CBC was within normal and PCT was less than 0.1.  UA showed rare bacteria with 0-5 WBCs and 5 ketones and CBC was within normal.  Influenza antigens and COVID-19 PCR came back negative.  Blood cultures and urine culture were drawn.  EKG as reviewed by me : EKG showed sinus tachycardia with rate 150 with probable left atrial enlargement and ST segment depression with T wave inversion in V6 and evaluation lead I and aVL with prolonged QT interval with QTC 519 MS. Imaging: Chest ray showed no acute cardiopulmonary disease.  Noncontrasted CT scan revealed pansinus disease, stable chronic encephalomalacia of both frontotemporal lobes, right frontal white matter and left frontal parietal lobe with no acute  intracranial abnormality. Abdominal pelvic CT scan revealed the following: 1. Debris within the trachea. 2. A 2.4 cm hypodensity within the pancreatic head. Question choledochal cyst. Markedly limited evaluation due to streak artifact. Recommend MRI pancreatic protocol further evaluation. When the patient is clinically stable and able to follow directions and hold their breath (preferably as an outpatient) further evaluation with dedicated abdominal MRI should be considered. 3. Cholelithiasis with no CT findings of acute cholecystitis. 4. Constipation. 5. No acute traumatic injury to the chest, abdomen, or pelvis.   6. No acute fracture or traumatic malalignment of the thoracic or lumbar spine   The patient was given IV Rocephin and Zithromax, 1.75 L of bolus of IV lactated Ringer.  He will be admitted to a PCU bed for further evaluation and management.   PAST MEDICAL HISTORY:   Past Medical History:  Diagnosis Date   Seizure disorder (HCC)    TBI (traumatic brain injury)     PAST SURGICAL HISTORY:   Past Surgical History:  Procedure Laterality Date   GASTROSTOMY TUBE PLACEMENT      SOCIAL HISTORY:   Social History   Tobacco Use   Smoking status: Never   Smokeless tobacco: Never  Substance Use Topics   Alcohol use: Not on file    FAMILY HISTORY:  No family history on file.  Unobtainable as the patient  is nonverbal.  DRUG ALLERGIES:  No Known Allergies  REVIEW OF SYSTEMS:   ROS As per history  of present illness. All pertinent systems were reviewed above. Constitutional, HEENT, cardiovascular, respiratory, GI, GU, musculoskeletal, neuro, psychiatric, endocrine, integumentary and hematologic systems were reviewed and are otherwise negative/unremarkable except for positive findings mentioned above in the HPI.   MEDICATIONS AT HOME:   Prior to Admission medications   Medication Sig Start Date End Date Taking? Authorizing Provider  acetaminophen (TYLENOL) 160  MG/5ML solution Place 20.3 mLs (650 mg total) into feeding tube every 6 (six) hours as needed for mild pain, moderate pain, headache or fever. 06/06/21   Rolly Salter, MD  baclofen (OZOBAX) 1 mg/mL SOLN oral solution Place 10 mLs (10 mg total) into feeding tube 3 (three) times daily. 06/04/21   Rolly Salter, MD  cholecalciferol (VITAMIN D3) 25 MCG (1000 UNIT) tablet Take 1,000 Units by mouth daily.    [provider]  dantrolene (DANTRIUM) 25 MG capsule Take 1 capsule (25 mg total) by mouth daily. 06/06/21   Rolly Salter, MD  gabapentin (NEURONTIN) 250 MG/5ML solution Place 8 mLs (400 mg total) into feeding tube every 8 (eight) hours. 06/04/21   Rolly Salter, MD  melatonin 3 MG TABS tablet Place 2 tablets (6 mg total) into feeding tube at bedtime. 06/04/21   Rolly Salter, MD  Nutritional Supplements (FEEDING SUPPLEMENT, OSMOLITE 1.5 CAL,) LIQD Place 237 mLs into feeding tube 5 (five) times daily. 06/04/21   Rolly Salter, MD  Nutritional Supplements (FEEDING SUPPLEMENT, PROSOURCE TF,) liquid Place 45 mLs into feeding tube 2 (two) times daily. 06/04/21   Rolly Salter, MD  polyethylene glycol (MIRALAX / GLYCOLAX) 17 g packet Place 17 g into feeding tube daily. 06/04/21   Rolly Salter, MD  PRESCRIPTION MEDICATION 120 mLs 3 (three) times daily. Med Plus 2.0    [provider]  QUEtiapine (SEROQUEL) 100 MG tablet Take 100 mg by mouth daily.    [provider]  triamcinolone ointment (KENALOG) 0.1 % Apply topically 2 (two) times daily. 06/03/21   Rolly Salter, MD  valproic acid (DEPAKENE) 250 MG/5ML solution Place 10 mLs (500 mg total) into feeding tube 3 (three) times daily. 06/03/21   Rolly Salter, MD  Water For Irrigation, Sterile (FREE WATER) SOLN Place 200 mLs into feeding tube every 8 (eight) hours. 06/04/21   Rolly Salter, MD  zinc oxide 20 % ointment Apply 1 application topically as directed. Apply to left flank/left thigh topically every shift     [provider]      VITAL SIGNS:  Blood pressure 116/68, pulse 99, temperature 99.6 F (37.6 C), resp. rate (!) 22, height 5\' 9"  (1.753 m), weight 59 kg, SpO2 98 %.  PHYSICAL EXAMINATION:  Physical Exam  GENERAL:  25 y.o.-year-old ill looking Caucasian male patient lying in the bed with no acute distress.  EYES: Pupils equal, round, reactive to light and accommodation. No scleral icterus. Extraocular muscles intact.  HEENT: Head atraumatic, normocephalic. Oropharynx and nasopharynx clear.  NECK:  Supple, no jugular venous distention. No thyroid enlargement, no tenderness.  LUNGS: Normal breath sounds bilaterally, no wheezing, rales,rhonchi or crepitation. No use of accessory muscles of respiration.  CARDIOVASCULAR: Regular rate and rhythm, S1, S2 normal. No murmurs, rubs, or gallops.  ABDOMEN: Soft, nondistended, nontender. Bowel sounds present. No organomegaly or mass.  EXTREMITIES: No pedal edema, cyanosis, or clubbing.  NEUROLOGIC: He has contractures in both upper extremities.  Extremities. Sensation intact. Gait not checked.  PSYCHIATRIC: The patient is alert and nonverbal.  No good  eye contact. SKIN: No obvious rash, lesion, or ulcer.   LABORATORY PANEL:   CBC Recent Labs  Lab 09/11/21 2024  WBC 9.7  HGB 14.1  HCT 43.7  PLT 240   ------------------------------------------------------------------------------------------------------------------  Chemistries  Recent Labs  Lab 09/11/21 2024  NA 134*  K 3.9  CL 101  CO2 18*  GLUCOSE 233*  BUN 20  CREATININE 0.97  CALCIUM 8.9  AST 89*  ALT 83*  ALKPHOS 85  BILITOT 0.6   ------------------------------------------------------------------------------------------------------------------  Cardiac Enzymes No results for input(s): TROPONINI in the last 168 hours. ------------------------------------------------------------------------------------------------------------------  RADIOLOGY:  CT Head Wo  Contrast  Result Date: 09/11/2021 CLINICAL DATA:  Seizure. Unknown cause. History of traumatic brain injury EXAM: CT HEAD WITHOUT CONTRAST TECHNIQUE: Contiguous axial images were obtained from the base of the skull through the vertex without intravenous contrast. COMPARISON:  CT head 07/09/2021 BRAIN: BRAIN Similar-appearing chronic encephalomalacia of bilateral frontotemporal lobes, right frontal white matter, left frontoparietal lobe. Similar-appearing enlargement of the lateral ventricles and third ventricle. Some of it likely due to ex vacuo dilatation. No evidence of large-territorial acute infarction. No parenchymal hemorrhage. No mass lesion. No extra-axial collection. No mass effect or midline shift. No hydrocephalus. Basilar cisterns are patent. Vascular: No hyperdense vessel. Skull: No acute fracture or focal lesion. Sinuses/Orbits: Mucosal thickening of the left sphenoid, bilateral ethmoid, bilateral frontal, bilateral maxillary sinuses. The orbits are unremarkable. Other: None. IMPRESSION: 1. No acute intracranial abnormality in a patient with Similar-appearing chronic encephalomalacia of bilateral frontotemporal lobes, right frontal white matter, left frontoparietal lobe. 2. Pansinus disease. Electronically Signed   By: Tish Frederickson M.D.   On: 09/11/2021 22:16   CT CHEST ABDOMEN PELVIS W CONTRAST  Result Date: 09/11/2021 CLINICAL DATA:  seizure tonight. Pt also meets sepsis criteria. EMS gave 2mg  of versed and pt is post- ictal at this time. EXAM: CT CHEST, ABDOMEN, AND PELVIS WITH CONTRAST TECHNIQUE: Multidetector CT imaging of the chest, abdomen and pelvis was performed following the standard protocol during bolus administration of intravenous contrast. CONTRAST:  OMNIPAQUE IOHEXOL 300 MG/ML  SOLN COMPARISON:  None. FINDINGS: Ports and Devices: Foley catheter with tip and balloon terminating within the urinary bladder lumen. CHEST: Lungs/airways: No focal consolidation. No  pulmonary nodule. No pulmonary mass. No pulmonary contusion or laceration. No pneumatocele formation. Debris within the trachea. Pleura: No pleural effusion. No pneumothorax. No hemothorax. Lymph Nodes: No mediastinal, hilar, or axillary lymphadenopathy. Mediastinum: No pneumomediastinum. No aortic injury or mediastinal hematoma. The thoracic aorta is normal in caliber. The heart is normal in size. No significant pericardial effusion. The esophagus is unremarkable. The thyroid is unremarkable. Chest Wall / Breasts: No chest wall mass. Musculoskeletal: No acute rib or sternal fracture. No spinal fracture. Partially visualized bilateral upper extremities grossly unremarkable. ABDOMEN / PELVIS: Liver: Not enlarged. No focal lesion. No laceration or subcapsular hematoma. Biliary System: Markedly limited evaluation due to streak artifact. Gallstones within the gallbladder lumen. No gallbladder wall thickening or pericholecystic fluid. No biliary ductal dilatation. Pancreas: Markedly limited evaluation due to streak artifact. Suggestion of a 2.4 cm hypodensity along the pancreatic head (3:60). Normal pancreatic contour. No main pancreatic duct dilatation. Spleen: Not enlarged. No focal lesion. No laceration, subcapsular hematoma, or vascular injury. Adrenal Glands: No nodularity bilaterally. Kidneys: Bilateral kidneys enhance symmetrically. Subcentimeter hypodensity within the right kidney too small to characterize. No hydronephrosis. No contusion, laceration, or subcapsular hematoma. No injury to the vascular structures or collecting systems. No hydroureter. The urinary bladder is decompressed and  grossly unremarkable. Bowel: No small or large bowel wall thickening or dilatation. Stool throughout the colon. The appendix is unremarkable. Mesentery, Omentum, and Peritoneum: No simple free fluid ascites. No pneumoperitoneum. No hemoperitoneum. No mesenteric hematoma identified. No organized fluid collection. Pelvic Organs:  Normal. Lymph Nodes: No abdominal, pelvic, inguinal lymphadenopathy. Vasculature: No abdominal aorta or iliac aneurysm. No active contrast extravasation or pseudoaneurysm. Musculoskeletal: No significant soft tissue hematoma. No acute pelvic fracture. No spinal fracture. IMPRESSION: 1. Debris within the trachea. 2. A 2.4 cm hypodensity within the pancreatic head. Question choledochal cyst. Markedly limited evaluation due to streak artifact. Recommend MRI pancreatic protocol further evaluation. When the patient is clinically stable and able to follow directions and hold their breath (preferably as an outpatient) further evaluation with dedicated abdominal MRI should be considered. 3. Cholelithiasis with no CT findings of acute cholecystitis. 4. Constipation. 5. No acute traumatic injury to the chest, abdomen, or pelvis. 6. No acute fracture or traumatic malalignment of the thoracic or lumbar spine. Electronically Signed   By: Tish Frederickson M.D.   On: 09/11/2021 22:29   DG Chest Port 1 View  Result Date: 09/11/2021 CLINICAL DATA:  Sepsis, seizure EXAM: PORTABLE CHEST 1 VIEW COMPARISON:  07/09/2021 FINDINGS: Single frontal view of the chest demonstrates an unremarkable cardiac silhouette. No acute airspace disease, effusion, or pneumothorax. No acute bony abnormality. IMPRESSION: 1. No acute intrathoracic process. Electronically Signed   By: Sharlet Salina M.D.   On: 09/11/2021 21:06      IMPRESSION AND PLAN:  Principal Problem:   Seizure (HCC)  1.  Seizure. - The patient be admitted to a PCU bed. - We will place him on seizure precautions. - He will be placed on as needed IV Ativan. -We will EEG and neurology consult will be obtained. - Dr. Iver Nestle will be notified in a.m.  I sent her a timed message. - We will continue the patient's valproic acid.  2.  Sepsis of questionable etiology, will need to rule out bacteremia..  The patient has significantly elevated lactic acid level that could be  associated with severe sepsis.  It could certainly be more likely related to her seizure.  I do not believe she has septic shock. - The patient will be placed on empiric broad-spectrum antibiotic therapy with IV cefepime, Flagyl and vancomycin. - We will follow blood and urine cultures. - We will hydrate with IV normal saline. - We will follow lactic acid  3.  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 - With clinical stability this can be further evaluated by pancreatic MRI.  4.  History of traumatic brain injury. - We will continue Neurontin, dantrolene, baclofen, and Seroquel  DVT prophylaxis: Lovenox.  Code Status: full code.  Family Communication:  The plan of care was discussed in details with the patient (and family). I answered all questions. The patient agreed to proceed with the above mentioned plan. Further management will depend upon hospital course. Disposition Plan: Back to previous home environment Consults called: Neurology. All the records are reviewed and case discussed with ED provider.  Status is: Inpatient   Remains inpatient appropriate because:Ongoing diagnostic testing needed not appropriate for outpatient work up, Unsafe d/c plan, IV treatments appropriate due to intensity of illness or inability to take PO, and Inpatient level of care appropriate due to severity of illness   Dispo: The patient is from: [facility]  Anticipated d/c is to: [facility].              Patient currently is not medically stable to d/c.              Difficult to place patient: No  TOTAL TIME TAKING CARE OF THIS PATIENT: 55 minutes.     Hannah Beat M.D on 09/12/2021 at 12:12 AM  [facility]   From 7 PM-7 AM, contact night-coverage [email or web address]  CC: Primary care physician; [facility]

## 2021-09-12 NOTE — Progress Notes (Signed)
Eeg done 

## 2021-09-12 NOTE — Assessment & Plan Note (Signed)
Possibly leading to aspiration pneumonia based on the CT scan showing debris in trachea. Will treat with Augmentin.

## 2021-09-12 NOTE — Consult Note (Addendum)
Neurology Consultation Reason for Consult: Status epilepticus Requesting Physician: Marcelle Smiling  CC: Multiple seizures  History is obtained from: Chart review given mental status of patient arriving overnight from facility  HPI: Alex [name] is a 25 y.o. male with a past medical history significant for traumatic brain injury in 10/10/2019, complicated by epilepsy, contractures but some limited movement of all 4 extremities.  He is presenting after report of 20 min GTC at his facility without clear inciting trigger; on sepsis protocol here. Per mom had Thanksgiving celebration yesterday, maybe a little over excited but no signs or clear symptoms of issues otherwise.  Labs here have been notable for initial CBC within normal limits now with mildly elevated white count at 11, creatinine increased from baseline (0.48-0.72 at baseline), 0.97 on admission now improving to baseline this morning, as well as elevated AST/ALT to the 80s and mild hyperglycemia to 233, mild hyponatremia to 134, and an initial lactate of 8.8, now normalized.  Procalcitonin was undetectable. Infectious work-up included negative respiratory panel (influenza and SARS-CoV-2), UA positive for hemoglobin without detectable red blood cells or evidence of infection, blood cultures collected, urine culture pending, VPA level at 20:24 on 11/20 was 90 (prior to any doses here)  I last saw the patient in August 2022, at which time he had presented to the ED after several witnessed generalized tonic-clonic seizures at his facility; valproic acid was found to be undetectable despite facility stating he had been getting his medications as prescribed per EDP.  He was also found to have electrolyte derangements with severe hypokalemia and hypocalcemia which were felt to be possibly artifactual by ED and not initially treated pending repeat labs.  There was concern for ongoing focal motor status secondary to right hand and face twitching,  and he required intubation for his mental status. My exam at the time was notable for post-ictal state with slight left gaze preference and increased tone on the left  Notably he presented due to a dislodged G-tube on 04/16/2021 and a fall on 05/23/2021 with increased combativeness post fall.  At that time he was also treated with amoxicillin for 7-day course due to concern for chronic versus acute maxillary sinusitis  After his traumatic brain injury, he was started on Keppra due to seizure activity. Of note was last admitted on 12/2020 after however he convulsive status epilepticus at which time his Keppra level was found to be undetectable despite the fact that he lived in a facility which stated his Keppra was being given via G-tube versus malabsorption/ineffective dose.  He was started on Depakote 250 mg every 8 hours with plan to discontinue Keppra as an outpatient, he was last seen by outpatient neurology on 02/18/2021 Alex [name], [facility] neurology).  At this time his Keppra was stopped.  His examination at the time was notable for interacting and tracking examiner saying "yeah baby" and "I love you" with disinhibited speech and inability to follow commands or answer any questions but attempts to strike examiner.  He had increased tone and flexed posture of all but the right upper extremity which was 5/5 in the deltoids, biceps, triceps and handgrip, and notably reflexes were 2+ in this right upper extremity and 1+ elsewhere  Per outpatient [facility] notes: "Seizure Type (1) Description: GTC Onset: 09/2019 Aura/associated symptoms: n/a Duration: 5-15 minutes Last seizure [prior to this admission]: 12/2020  Previous AEDs: keppra 1g BID Current AEDs: Keppra 1g BID, Depakote ER 250mg  q8h" At last discharge from [facility]ospital, he was ordered for 500 mg every 8 hours Depakote, without Keppra.  I do not see any notes from outpatient neurology follow-up since then  Seizure triggers/provoking features:  Undetectable antiseizure medication levels Seizure risk factors: History of significant traumatic brain injury with cortical encephalomalacia Birth/Developmental history: unable to confirm  Prior Studies: --- cvEEG (1/1-10/26/2019) with diffuse slowing, at times slowing is noted to be periodic to rhythmical however there is no evolution of frequency. No seizures. --- cvEEG (01/13/2021) with L > R cerebral dysfunction with a superimposed diffuse cerebral dysfunction. No sizures or epileptiform discharges --- MRI Brain wwo contrast (10/30/2019): diffuse axonal injury, parenchymal edema w/in the corpus callosum, periventricular white matter as well as L>R temporal lobes  Interval history: History taken from a nurse at his current living facility [facility]. No reports of seizures at the facility since discharge. He has been tolerating the depakote well, no reports of abdominal discomfort or oversedation."  Current Outpatient Medications  Medication Instructions   acetaminophen (TYLENOL) 650 mg, Per Tube, Every 6 hours PRN   baclofen (LIORESAL) 10 mg, Oral, 3 times daily   baclofen (OZOBAX) 10 mg, Per Tube, 3 times daily   cholecalciferol (VITAMIN D3) 1,000 Units, Oral, Daily   dantrolene (DANTRIUM) 25 mg, Oral, Daily   divalproex (DEPAKOTE SPRINKLE) 1,000 mg, Oral, 2 times daily   gabapentin (NEURONTIN) 400 mg, Per Tube, Every 8 hours   melatonin 6 mg, Per Tube, Daily at bedtime   Nutritional Supplements (FEEDING SUPPLEMENT, OSMOLITE 1.5 CAL,) LIQD 237 mLs, Per Tube, 5 times daily   Nutritional Supplements (FEEDING SUPPLEMENT, PROSOURCE TF,) liquid 45 mLs, Per Tube, 2 times daily   nystatin (MYCOSTATIN/NYSTOP) powder 1 application, Topical, 2 times daily   polyethylene glycol (MIRALAX / GLYCOLAX) 17 g, Per Tube, Daily   PRESCRIPTION MEDICATION 120 mLs, 3 times daily, Med Plus 2.0   QUEtiapine (SEROQUEL) 100 mg, Daily   QUEtiapine (SEROQUEL) 75 mg, Oral, Every morning    QUEtiapine (SEROQUEL) 150 mg, Oral, Daily at bedtime   Skin Protectants, Misc. (MINERIN CREME EX) 1 application, Apply externally, Daily   triamcinolone ointment (KENALOG) 0.1 % Topical, 2 times daily   valproic acid (DEPAKENE) 500 mg, Per Tube, 3 times daily   Water For Irrigation, Sterile (FREE WATER) SOLN 200 mLs, Per Tube, Every 8 hours   zinc oxide 20 % ointment 1 application, As directed    Current Meds  Medication Sig   baclofen (LIORESAL) 10 MG tablet Take 10 mg by mouth 3 (three) times daily.   cholecalciferol (VITAMIN D3) 25 MCG (1000 UNIT) tablet Take 1,000 Units by mouth daily.   dantrolene (DANTRIUM) 25 MG capsule Take 1 capsule (25 mg total) by mouth daily.   divalproex (DEPAKOTE SPRINKLE) 125 MG capsule Take 1,000 mg by mouth 2 (two) times daily.   gabapentin (NEURONTIN) 250 MG/5ML solution Place 8 mLs (400 mg total) into feeding tube every 8 (eight) hours.   melatonin 3 MG TABS tablet Place 2 tablets (6 mg total) into feeding tube at bedtime.   nystatin (MYCOSTATIN/NYSTOP) powder Apply 1 application topically 2 (two) times daily.   polyethylene glycol (MIRALAX / GLYCOLAX) 17 g packet Place 17 g into feeding tube daily.   QUEtiapine (SEROQUEL) 25 MG tablet Take 75 mg by mouth in the morning.   QUEtiapine (SEROQUEL) 50 MG tablet Take 150 mg by mouth at bedtime.   Skin Protectants, Misc. (MINERIN CREME EX) Apply 1 application topically daily.    ROS: Unable to  obtain due to altered mental status and poor baseline mental status.   Past Medical History:  Diagnosis Date   Seizure disorder [facility])    TBI (traumatic brain injury)     No family history on file. Unable to assess secondary to patient's mental status   Social History:  reports that he has never smoked. He has never used smokeless tobacco. No history on file for alcohol use and drug use. Unable to assess secondary to patient's mental status   Exam: Current vital signs: BP 119/78   Pulse 88   Temp 98.5 F  (36.9 C)   Resp 16   Ht 5\' 9"  (1.753 m)   Wt 59 kg   SpO2 100%   BMI 19.21 kg/m  Vital signs in last 24 hours: Temp:  [98.5 F (36.9 C)-103 F (39.4 C)] 98.5 F (36.9 C) (11/21 0500) Pulse Rate:  [88-159] 88 (11/21 0500) Resp:  [16-26] 16 (11/21 0500) BP: (102-151)/(60-98) 119/78 (11/21 0500) SpO2:  [97 %-100 %] 100 % (11/21 0500) Weight:  [59 kg] 59 kg (11/20 2021)   Physical Exam  Constitutional: Appears well-developed and well-nourished.  Psych: Minimally interactive Eyes: No scleral injection HENT: No oropharyngeal obstruction.  MSK: Contractures in all but the right upper extremity Cardiovascular: Rate and rhythm Respiratory: Breathing comfortably GI: Soft.  No distension. There is no tenderness.  Skin: No lesions on visible skin  Neuro: Mental Status: Eyes open, not verbal for me (per nursing replied "No" to them once), not following commands Cranial Nerves: II:  Pupils are equal, round, and reactive to light.   [name],IV, VI: EOMI without ptosis or diploplia, saccadic eye movements with prominent left gaze preference but can nearly bury to right with encouragement V: Facial sensation is symmetric to eyelash brush VII: Facial movement is symmetric at rest and to eyelash brush, minimal grimace to noxious stim VIII: Briefly orients to examiner on the right Remainder limited by mental status Motor: Tone is increased on the left compared to the right.  He clearly withdraws in the right upper extremity and additionally has complex movement in the left upper extremity.  Has clonus of the right foot with noxious stimulation, cannot clearly identify whether the movement is withdrawal versus triple flexion with spasticity.  Left lower extremity has more complex movement. Sensory: Appears equally reactive to noxious stimulation in all 4 extremities Deep Tendon Reflexes: Sustained clonus of the bilateral ankles Cerebellar: Unable to assess secondary to patient's mental status     I have reviewed labs in epic and the results pertinent to this consultation are:  Basic Metabolic Panel: Recent Labs  Lab 09/11/21 2024 09/12/21 0630  NA 134* 140  K 3.9 4.2  CL 101 104  CO2 18* 28  GLUCOSE 233* 94  BUN 20 10  CREATININE 0.97 0.61  CALCIUM 8.9 8.7*     CBC: Recent Labs  Lab 09/11/21 2024 09/12/21 0550  WBC 9.7 11.0*  NEUTROABS 7.8*  --   HGB 14.1 13.4  HCT 43.7 40.5  MCV 96.3 94.4  PLT 240 213     Coagulation Studies: Recent Labs    09/12/21 0550  LABPROT 23.8*  INR 2.1*     VPA level therapeutic at 90, unclear if this was a true trough or if he received his last dose prior to arrival in the ED   I have reviewed the images obtained: Head CT 05/23/2021 very motion degraded but negative for acute intracranial process, post TBI atrophic changes bilaterally, slightly worse  on the left side than the right  CT head 09/11/2021 personally reviewed, agree with radiology: 1. No acute intracranial abnormality in a patient with similar-appearing chronic encephalomalacia of bilateral frontotemporal lobes, right frontal white matter, left frontoparietal lobe. 2. Pansinus disease.  CT chest/abdomen/pelvis 1. Debris within the trachea.  2. A 2.4 cm hypodensity within the pancreatic head. Question choledochal cyst. Markedly limited evaluation due to streak artifact. Recommend MRI pancreatic protocol further evaluation. When the patient is clinically stable and able to follow directions and hold their breath (preferably as an outpatient) further evaluation with dedicated abdominal MRI should be considered. 3. Cholelithiasis with no CT findings of acute cholecystitis. 4. Constipation. 5. No acute traumatic injury to the chest, abdomen, or pelvis.  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 Likely presenting  symptoms/signs all secondary to breakthrough seizure, discussed with primary team, will observe off antibiotics except Augmentin to cover likely aspiration event today.  Tentative plan for discharge 11/22 if patient continues to clinically improve and remained stable off of antibiotics, mother was updated and all of her questions were answered at about 10:30 AM  Recommendations: -Please narrow off of cefepime as soon as possible given seizure threshold lowering nature of this antibiotic; appreciate plan per primary team to continue Augmentin for potential aspiration pneumonia -Repeat CMP this afternoon to monitor LFTs as well as electrolytes, CK and ammonia for drug safety monitoring -Continue home Depakote, please clarify dosing as there is a note of 1000 mg twice daily as well as 400 mg every 8 hours; was on 500 every 8 hours per last discharge summary; requested pharmacy assistance   -Addendum resume Depakote 1000 mg twice daily as this was confirmed by pharmacy with facility.  Recheck trough level in the morning prior to discharge which can be followed up by PCP/outpatient neurologist  -Keppra 500 mg BID ordered as adjunctive, confirmed with mom no significant behavioral side effects she has noted with this.  Please continue this at discharge -Continue home baclofen 10 mg 3 times daily as withdrawal of this agent can also lower seizure threshold (already ordered) -Continue home gabapentin 400 mg every 8 hours as this can also act as an antiseizure medication (already ordered) -Consider holding home Seroquel until mental status improves, defer to primary team -Hold home dantrolene given this may be contributing to his liver dysfunction (discontinued for now) -Appreciate additional infectious work-up per primary team -Neurology will be available on an as-needed basis going forward, please reach out if new questions or concerns arise  Brooke Dare MD-PhD [facility] [phone] [facility] coverage for [facility] is from 8 AM to 4 AM in-house and 4 PM to 8 PM by telephone/video. 8 PM to 8 AM emergent questions or overnight urgent questions should be addressed to Teleneurology On-call or [facility] neurohospitalist; contact information can be found on AMION

## 2021-09-12 NOTE — Consult Note (Signed)
Consult place for PharmD to clarify Depakote/valproic acid dose at SNF.  [facility] at [phone] Spoke with [employer]  Patient currently taking Depakote sprinkle capsules 1000 mg (125mg  x 8 capsules) twice daily. Started 07/22/2021.

## 2021-09-12 NOTE — ED Notes (Signed)
MD Lynden Oxford sent secure message to call and update patients mother Deidre Ala per her request

## 2021-09-12 NOTE — Progress Notes (Signed)
  Progress Note    Alex [name]   EQA:[id number]  DOB: [birth year]  DOA: 09/11/2021     1 Date of Service: 09/12/2021   Clinical Course 11/20 admitted secondary to seizure. 11/21 neuro consulted.  Mentation improving.  Monitor.  Assessment and Plan * Seizure [facility]) Patient has history of traumatic brain injury leading to recurrent seizure episodes. Currently on Depakote. Level therapeutic. Neurology consult appreciated. EEG negative for any acute seizures. Recheck Depakote level tomorrow.  Monitor overnight.  Keppra added.  Aspiration into airway Possibly leading to aspiration pneumonia based on the CT scan showing debris in trachea. Will treat with Augmentin.  Lactic acidosis Secondary to seizure rather than sepsis. Lactic acid cleared easily. Monitor.  Malnutrition of moderate degree Continued tube feeding.  Transaminitis AST 89 and ALT 83.  Likely in the setting of seizure.  Monitor.  Low serum cortisol level Cortisol level 3.  Unsure of etiology.  Will check cosyntropin stimulation test.  Pancreatic mass Seen incidentally on the CT scan. Outpatient follow-up with pancreatic mass protocol recommended.  TBI (traumatic brain injury) Because of patient's seizure.  Monitor.    Subjective:  Unable to follow any commands.  No acute events.  No further seizure.  Objective Vitals:   09/12/21 1830 09/12/21 1845 09/12/21 1900 09/12/21 1930  BP: (!) 94/59 101/61 (!) 106/55 (!) 104/57  Pulse: 64 72 65 63  Resp: 13 11  13   Temp: (!) 97.4 F (36.3 C) (!) 97.3 F (36.3 C) 97.6 F (36.4 C) 98 F (36.7 C)  TempSrc:      SpO2: 99% 100% 100% 100%  Weight:      Height:       59 kg  Blood pressure is soft.   Exam General: Appear in mild distress, no Rash; Oral Mucosa Clear, moist. no Abnormal Neck Mass Or lumps, Conjunctiva normal  Cardiovascular: S1 and S2 Present, no Murmur, Respiratory: good respiratory effort, Bilateral Air entry present and CTA, no  Crackles, no wheezes Abdomen: Bowel Sound present, Soft Extremities: no Pedal edema Neurology: alert and not oriented, nonverbal at baseline.  Unable to follow any commands at baseline. Gait not checked due to patient safety concerns    Labs / Other Information Cortisol level low.  Disposition Plan: Status is: Inpatient  Remains inpatient appropriate because: Recurrent seizures.  Medication adjusted.  Need to ensure stability.  Time spent: 35 minutes [facility] 09/12/2021, 7:54 PM

## 2021-09-12 NOTE — Assessment & Plan Note (Addendum)
Cortisol level 3.  Unsure of etiology.  cosyntropin stimulation test normal. No change in therapy recommended

## 2021-09-12 NOTE — Progress Notes (Signed)
Initial Nutrition Assessment  DOCUMENTATION CODES:   Not applicable  INTERVENTION:   -Continue dysphagia 1 diet with thin liquids -Continue bolus feedings:   237 ml Osmolite 1.5 5 times daily via PEG  45 ml Prosource TF BID  Complete TF regimen 1855 kcals, 97 grams protein, and 905 ml free water daily  NUTRITION DIAGNOSIS:   Increased nutrient needs related to chronic illness (TBI) as evidenced by estimated needs.  GOAL:   Patient will meet greater than or equal to 90% of their needs  MONITOR:   PO intake, Supplement acceptance, Labs, Weight trends, TF tolerance, Skin, I & O's  REASON FOR ASSESSMENT:   Rounds    ASSESSMENT:   Alex [name] is a 25 y.o. Caucasian unlocked male with medical history significant for traumatic brain injury and seizure disorder, status post PEG tube, who presented to emergency room with acute onset of seizure that lasted about 20 minutes at her SNF.  This was aborted by IV Versed given by EMS.  He was noted to be febrile and tachycardic by EMS.  He was also reportedly hypoxic with pulse oximetry of 85% on room air for which she was placed on nasal cannula.  No history could be obtained from the patient as he was nonverbal.  Pt admitted with seizure and sepsis.   Reviewed I/O's: +816 ml x 24 hours  UOP: 3 L x 24 hours  Pt unavailable at time of visit. RD unable to obtain further nutrition-related history or complete nutrition-focused physical exam at this time.    Pt is a resident of [facility]. He consumes a dysphagia 1 diet for comfort, but also receives TF via PEG. Home TF regimen is 237 ml Osmolite 1.5 5 times daily and 45 ml Prosource TF BID, which provides 1855 kcals, 97 grams protein, and 905 ml free water daily.  Reviewed wt hx; wt has been stable over the past 8 months.   Medications reviewed and include vitamin D3, keppra, melatonin, and 0.9% sodium chloride infusion @ 100 ml/hr.   Labs reviewed.   Diet Order:    Diet Order             DIET - DYS 1 Room service appropriate? Yes; Fluid consistency: Thin  Diet effective now                   EDUCATION NEEDS:   No education needs have been identified at this time  Skin:  Skin Assessment: Reviewed RN Assessment  Last BM:  Unknown  Height:   Ht Readings from Last 1 Encounters:  09/11/21 5\' 9"  (1.753 m)    Weight:   Wt Readings from Last 1 Encounters:  09/11/21 59 kg    Ideal Body Weight:  72.7 kg  BMI:  Body mass index is 19.21 kg/m.  Estimated Nutritional Needs:   Kcal:  1750-1950  Protein:  90-105 grams  Fluid:  > 1.7 L    09/13/21, RD, LDN, CDCES Registered Dietitian II Certified Diabetes Care and Education Specialist Please refer to [facility] for RD and/or RD on-call/weekend/after hours pager

## 2021-09-13 LAB — VALPROIC ACID LEVEL: Valproic Acid Lvl: 89 ug/mL (ref 50.0–100.0)

## 2021-09-13 LAB — COMPREHENSIVE METABOLIC PANEL
ALT: 61 U/L — ABNORMAL HIGH (ref 0–44)
AST: 34 U/L (ref 15–41)
Albumin: 3.1 g/dL — ABNORMAL LOW (ref 3.5–5.0)
Alkaline Phosphatase: 57 U/L (ref 38–126)
Anion gap: 6 (ref 5–15)
BUN: 6 mg/dL (ref 6–20)
CO2: 26 mmol/L (ref 22–32)
Calcium: 8.6 mg/dL — ABNORMAL LOW (ref 8.9–10.3)
Chloride: 108 mmol/L (ref 98–111)
Creatinine, Ser: 0.32 mg/dL — ABNORMAL LOW (ref 0.61–1.24)
GFR, Estimated: >60 mL/min (ref >60)
Glucose, Bld: 94 mg/dL (ref 70–99)
Potassium: 4.3 mmol/L (ref 3.5–5.1)
Sodium: 140 mmol/L (ref 135–145)
Total Bilirubin: 0.6 mg/dL (ref 0.3–1.2)
Total Protein: 5.8 g/dL — ABNORMAL LOW (ref 6.5–8.1)

## 2021-09-13 LAB — ACTH STIMULATION, 3 TIME POINTS
Cortisol, 30 Min: 23.2 ug/dL
Cortisol, 60 Min: 28 ug/dL
Cortisol, Base: 9.2 ug/dL

## 2021-09-13 LAB — CK: Total CK: 536 U/L — ABNORMAL HIGH (ref 49–397)

## 2021-09-13 LAB — AMMONIA: Ammonia: 21 umol/L (ref 9–35)

## 2021-09-13 MED ORDER — LEVETIRACETAM 100 MG/ML PO SOLN
500.0000 mg | Freq: Two times a day (BID) | ORAL | Status: DC
  Administered 2021-09-13: 500 mg
  Filled 2021-09-13 (×2): qty 5

## 2021-09-13 MED ORDER — CHLORHEXIDINE GLUCONATE CLOTH 2 % EX PADS
6.0000 | MEDICATED_PAD | Freq: Every day | CUTANEOUS | Status: DC
  Administered 2021-09-13: 6 via TOPICAL

## 2021-09-13 MED ORDER — LEVETIRACETAM 100 MG/ML PO SOLN: 500.0000 mg | Freq: Two times a day (BID) | ORAL | 12 refills | Status: DC

## 2021-09-13 MED ORDER — AMOXICILLIN-POT CLAVULANATE 875-125 MG PO TABS: 1.0000 | ORAL_TABLET | Freq: Two times a day (BID) | ORAL | 0 refills | Status: AC

## 2021-09-13 MED ORDER — TRAZODONE HCL 50 MG PO TABS: 50.0000 mg | ORAL_TABLET | Freq: Every evening | ORAL | Status: DC | PRN

## 2021-09-13 MED ORDER — CHLORHEXIDINE GLUCONATE 0.12 % MT SOLN
15.0000 mL | Freq: Two times a day (BID) | OROMUCOSAL | Status: DC
  Administered 2021-09-13: 15 mL via OROMUCOSAL

## 2021-09-13 MED ORDER — VITAMIN D 25 MCG (1000 UNIT) PO TABS
1000.0000 [IU] | ORAL_TABLET | Freq: Every day | ORAL | Status: DC
  Administered 2021-09-13: 1000 [IU]
  Filled 2021-09-13: qty 1

## 2021-09-13 MED ORDER — ORAL CARE MOUTH RINSE: 15.0000 mL | Freq: Two times a day (BID) | OROMUCOSAL | Status: DC

## 2021-09-13 NOTE — [place] FL2 (Signed)
[place] MEDICAID FL2 LEVEL OF CARE SCREENING TOOL     IDENTIFICATION  Patient Name: Alex [name] Birthdate: [birth year] Sex: male Admission Date (Current Location): 09/11/2021  [facility] and [place] Number:  Chiropodist and Address:  [facility], [address] [place], [place] [address]      Provider Number: [id number]  Attending Physician Name and Address:  Rolly Salter, MD  Relative Name and Phone Number:       Current Level of Care: Hospital Recommended Level of Care: Skilled Nursing Facility Prior Approval Number:    Date Approved/Denied:   PASRR Number:    Discharge Plan: SNF    Current Diagnoses: Patient Active Problem List   Diagnosis Date Noted   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   Aspiration into airway 09/12/2021   Low serum cortisol level 09/12/2021   Lactic acidosis 09/12/2021   Transaminitis 09/12/2021   Seizure (HCC) 09/11/2021   Status epilepticus (HCC) 05/31/2021   Endotracheally intubated 05/31/2021   TBI (traumatic brain injury) 05/31/2021   Hypokalemia 05/31/2021   Hypocalcemia 05/31/2021   Hypomagnesemia 05/31/2021   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   Malnutrition of moderate degree 05/31/2021    Orientation RESPIRATION BLADDER Height & Weight      (UTA- pt is non verbal)  Normal Incontinent Weight: 130 lb 1.1 oz (59 kg) Height:  5\' 9"  (175.3 cm)  BEHAVIORAL SYMPTOMS/MOOD NEUROLOGICAL BOWEL NUTRITION STATUS      Continent Diet (DYS 1 diet, thin liquids)  AMBULATORY STATUS COMMUNICATION OF NEEDS Skin   Extensive Assist Verbally Normal                       Personal Care Assistance Level of Assistance  Bathing, Feeding, Dressing Bathing Assistance: Maximum assistance Feeding assistance: Limited assistance Dressing Assistance: Maximum assistance     Functional Limitations Info  Sight, Hearing, Speech Sight Info: Adequate Hearing Info: Adequate (pt is non verbal) Speech Info:  (pt is non  verbal)    SPECIAL CARE FACTORS FREQUENCY  PT (By licensed PT), OT (By licensed OT)     PT Frequency: 5x OT Frequency: 5x            Contractures Contractures Info: Not present    Additional Factors Info  Code Status, Allergies Code Status Info: full code Allergies Info: no active allergies           Current Medications (09/13/2021):  This is the current hospital active medication list Current Facility-Administered Medications  Medication Dose Route Frequency Provider Last Rate Last Admin   acetaminophen (TYLENOL) tablet 650 mg  650 mg Oral Q6H PRN Mansy, Jan A, MD       Or   acetaminophen (TYLENOL) suppository 650 mg  650 mg Rectal Q6H PRN Mansy, Jan A, MD       amoxicillin-clavulanate (AUGMENTIN) 875-125 MG per tablet 1 tablet  1 tablet Per Tube Q12H Feb, MD   1 tablet at 09/13/21 1005   baclofen (LIORESAL) tablet 10 mg  10 mg Per Tube TID Mansy, Jan A, MD   10 mg at 09/13/21 1006   chlorhexidine (PERIDEX) 0.12 % solution 15 mL  15 mL Mouth Rinse BID Mansy, Jan A, MD   15 mL at 09/13/21 1006   Chlorhexidine Gluconate Cloth 2 % PADS 6 each  6 each Topical Q0600 Mansy, 09/15/21, MD   6 each at 09/13/21 09/15/21   cholecalciferol (VITAMIN D3) tablet 1,000 Units  1,000 Units Per  Tube Daily Rolly Salter, MD   1,000 Units at 09/13/21 1005   dextrose 5 %-0.9 % sodium chloride infusion   Intravenous Continuous Rolly Salter, MD 100 mL/hr at 09/13/21 0221 New Bag at 09/13/21 0221   enoxaparin (LOVENOX) injection 40 mg  40 mg Subcutaneous Q24H Mansy, Jan A, MD   40 mg at 09/13/21 1005   feeding supplement (OSMOLITE 1.5 CAL) liquid 237 mL  237 mL Per Tube 5 X Daily Mansy, Jan A, MD 0 mL/hr at 09/12/21 0630 237 mL at 09/13/21 1006   feeding supplement (PROSource TF) liquid 45 mL  45 mL Per Tube BID Mansy, Jan A, MD   45 mL at 09/13/21 1006   gabapentin (NEURONTIN) 250 MG/5ML solution 400 mg  400 mg Per Tube Q8H Mansy, Jan A, MD   400 mg at 09/13/21 1024   levETIRAcetam  (KEPPRA) 100 MG/ML solution 500 mg  500 mg Per Tube BID Rolly Salter, MD   500 mg at 09/13/21 1004   LORazepam (ATIVAN) injection 1 mg  1 mg Intravenous Q1H PRN Mansy, Jan A, MD       magnesium hydroxide (MILK OF MAGNESIA) suspension 30 mL  30 mL Oral Daily PRN Mansy, Jan A, MD       MEDLINE mouth rinse  15 mL Mouth Rinse q12n4p Mansy, Jan A, MD       melatonin tablet 5 mg  5 mg Per Tube QHS Mansy, Jan A, MD   5 mg at 09/12/21 2138   ondansetron (ZOFRAN) tablet 4 mg  4 mg Oral Q6H PRN Mansy, Jan A, MD       Or   ondansetron [facility]) injection 4 mg  4 mg Intravenous Q6H PRN Mansy, Jan A, MD       polyethylene glycol (MIRALAX / GLYCOLAX) packet 17 g  17 g Per Tube Daily Mansy, Jan A, MD   17 g at 09/13/21 1005   traZODone (DESYREL) tablet 50 mg  50 mg Per Tube QHS PRN Rolly Salter, MD       triamcinolone ointment (KENALOG) 0.1 %   Topical BID Mansy, Jan A, MD   Given at 09/13/21 1005   valproic acid (DEPAKENE) 250 MG/5ML solution 1,000 mg  1,000 mg Per Tube BID Bhagat, Srishti L, MD   1,000 mg at 09/13/21 1004   zinc oxide 20 % ointment 1 application  1 application Topical TID Mansy, Vernetta Honey, MD   1 application at 09/13/21 1005     Discharge Medications: Please see discharge summary for a list of discharge medications.  Relevant Imaging Results:  Relevant Lab Results:   Additional Information SSN:[id number]  Maree Krabbe, LCSW

## 2021-09-13 NOTE — TOC Initial Note (Addendum)
Transition of Care [facility]) - Initial/Assessment Note    Patient Details  Name: Alex [name] MRN: [id number] Date of Birth: [birth year]  Transition of Care [facility]) CM/SW Contact:    Maree Krabbe, LCSW Phone Number: 09/13/2021, 11:40 AM  Clinical Narrative:    Pt is non verbal. CSW spoke with pt's Guardian and mother Cala Bradford and she confirmed pt is LTC at [facility]. CSW got verbal permission from [facility] to prepare for pt to return there. CSW is waiting on confirmation from Tanya at [facility] to see if they can take pt back today.               Kenney Houseman confirmed pt can return today. MD notified.  Expected Discharge Plan: Skilled Nursing Facility Barriers to Discharge: No Barriers Identified   Patient Goals and CMS Choice Patient states their goals for this hospitalization and ongoing recovery are:: pt is non verbal      Expected Discharge Plan and Services Expected Discharge Plan: Skilled Nursing Facility In-house Referral: NA   Post Acute Care Choice: Skilled Nursing Facility Living arrangements for the past 2 months: Skilled Nursing Facility                                      Prior Living Arrangements/Services Living arrangements for the past 2 months: Skilled Nursing Facility Lives with:: Facility Resident Patient language and need for interpreter reviewed:: Yes Do you feel safe going back to the place where you live?: Yes      Need for Family Participation in Patient Care: Yes (Comment) Care giver support system in place?: Yes (comment)   Criminal Activity/Legal Involvement Pertinent to Current Situation/Hospitalization: No - Comment as needed  Activities of Daily Living      Permission Sought/Granted Permission sought to share information with : Family Supports    Share Information with NAME: Cala Bradford  Permission granted to share info w AGENCY: [facility]  Permission granted to share info w Relationship: mother/guardian      Emotional Assessment Appearance:: Appears stated age Attitude/Demeanor/Rapport: Unable to Assess Affect (typically observed): Unable to Assess Orientation: :  (UTA- pt is non verbal) Alcohol / Substance Use: Not Applicable Psych Involvement: No (comment)  Admission diagnosis:  Seizure (HCC) [R56.9] Seizures (HCC) [R56.9] Sepsis (HCC) [A41.9] Patient Active Problem List   Diagnosis Date Noted   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   Aspiration into airway 09/12/2021   Low serum cortisol level 09/12/2021   Lactic acidosis 09/12/2021   Transaminitis 09/12/2021   Seizure (HCC) 09/11/2021   Status epilepticus (HCC) 05/31/2021   Endotracheally intubated 05/31/2021   TBI (traumatic brain injury) 05/31/2021   Hypokalemia 05/31/2021   Hypocalcemia 05/31/2021   Hypomagnesemia 05/31/2021   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

## 2021-09-13 NOTE — Plan of Care (Signed)
Pt is nonverbal and only withdraws from pain. Pt does not nod appropiately to yes or no questions.  Problem: Education: Goal: Expressions of having a comfortable level of knowledge regarding the disease process will increase Outcome: Not Progressing   Problem: Coping: Goal: Ability to identify appropriate support needs will improve Outcome: Not Progressing   Problem: Safety: Goal: Verbalization of understanding the information provided will improve Outcome: Not Progressing   Problem: Self-Concept: Goal: Ability to verbalize feelings about condition will improve Outcome: Not Progressing

## 2021-09-13 NOTE — TOC Progression Note (Signed)
Transition of Care [facility]) - Progression Note    Patient Details  Name: Alex STEPANEK MRN: [id number] Date of Birth: 1996/03/27  [facility]) CM/SW Contact  Maree Krabbe, LCSW Phone Number: 09/13/2021, 4:21 PM  Clinical Narrative:   Pt's foley removed however pt has not urinated. Pt must urinate prior to dc to SNF.    Expected Discharge Plan: Skilled Nursing Facility Barriers to Discharge: No Barriers Identified  Expected Discharge Plan and Services Expected Discharge Plan: Skilled Nursing Facility In-house Referral: NA   Post Acute Care Choice: Skilled Nursing Facility Living arrangements for the past 2 months: Skilled Nursing Facility Expected Discharge Date: 09/13/21                                     Social Determinants of Health (SDOH) Interventions    Readmission Risk Interventions No flowsheet data found.

## 2021-09-13 NOTE — Progress Notes (Signed)
Discharge report called to [facility], [place] , verbalized understanding/ ivs and tele removed/ foley d/c - pt has voided/ EMS called to transport.

## 2021-09-13 NOTE — Plan of Care (Signed)
Pt schedule to discharge back to [facility] today.   Problem: Education: Goal: Knowledge of General Education information will improve Description: Including pain rating scale, medication(s)/side effects and non-pharmacologic comfort measures Outcome: Completed/Met   Problem: Health Behavior/Discharge Planning: Goal: Ability to manage health-related needs will improve Outcome: Completed/Met   Problem: Clinical Measurements: Goal: Ability to maintain clinical measurements within normal limits will improve Outcome: Completed/Met Goal: Will remain free from infection Outcome: Completed/Met Goal: Diagnostic test results will improve Outcome: Completed/Met Goal: Respiratory complications will improve Outcome: Completed/Met Goal: Cardiovascular complication will be avoided Outcome: Completed/Met   Problem: Activity: Goal: Risk for activity intolerance will decrease Outcome: Completed/Met   Problem: Nutrition: Goal: Adequate nutrition will be maintained Outcome: Completed/Met   Problem: Coping: Goal: Level of anxiety will decrease Outcome: Completed/Met   Problem: Elimination: Goal: Will not experience complications related to bowel motility Outcome: Completed/Met Goal: Will not experience complications related to urinary retention Outcome: Completed/Met   Problem: Pain Managment: Goal: General experience of comfort will improve Outcome: Completed/Met   Problem: Safety: Goal: Ability to remain free from injury will improve Outcome: Completed/Met   Problem: Skin Integrity: Goal: Risk for impaired skin integrity will decrease Outcome: Completed/Met   Problem: Education: Goal: Expressions of having a comfortable level of knowledge regarding the disease process will increase Outcome: Completed/Met   Problem: Coping: Goal: Ability to adjust to condition or change in health will improve Outcome: Completed/Met Goal: Ability to identify appropriate support needs  will improve Outcome: Completed/Met   Problem: Health Behavior/Discharge Planning: Goal: Compliance with prescribed medication regimen will improve Outcome: Completed/Met   Problem: Medication: Goal: Risk for medication side effects will decrease Outcome: Completed/Met   Problem: Clinical Measurements: Goal: Complications related to the disease process, condition or treatment will be avoided or minimized Outcome: Completed/Met Goal: Diagnostic test results will improve Outcome: Completed/Met   Problem: Safety: Goal: Verbalization of understanding the information provided will improve Outcome: Completed/Met   Problem: Self-Concept: Goal: Level of anxiety will decrease Outcome: Completed/Met Goal: Ability to verbalize feelings about condition will improve Outcome: Completed/Met

## 2021-09-13 NOTE — TOC Transition Note (Signed)
Transition of Care [facility]) - CM/SW Discharge Note   Patient Details  Name: Alex [name] MRN: [id number] Date of Birth: 06-29-1996  Transition of Care [facility]) CM/SW Contact:  Maree Krabbe, LCSW Phone Number: 09/13/2021, 2:59 PM   Clinical Narrative:   Clinical Social Worker facilitated patient discharge including contacting patient family and facility to confirm patient discharge plans.  Clinical information faxed to facility and family agreeable with plan.  CSW arranged ambulance transport via [facility] to [facility] (room 25) .  RN to call [phone] for report prior to discharge.     Final next level of care: Skilled Nursing Facility Barriers to Discharge: No Barriers Identified   Patient Goals and CMS Choice Patient states their goals for this hospitalization and ongoing recovery are:: pt is non verbal      Discharge Placement              Patient chooses bed at:  [facility]) Patient to be transferred to facility by: [facility]   Patient and family notified of of transfer: 09/13/21  Discharge Plan and Services In-house Referral: NA   Post Acute Care Choice: Skilled Nursing Facility                               Social Determinants of Health (SDOH) Interventions     Readmission Risk Interventions No flowsheet data found.

## 2021-09-13 NOTE — Progress Notes (Signed)
Neurology Progress Note  Patient ID: Alex [name] is a 25 y.o. with PMHx of  has a past medical history of Seizure disorder (HCC) and TBI (traumatic brain injury).  Initially consulted for: Breakthrough seizure  Major interval events/Subjective: - Repeating "I love you" this morning   Exam: Current vital signs: BP (!) 134/92 (BP Location: Right Arm)   Pulse (!) 109   Temp 98.6 F (37 C)   Resp 18   Ht 5\' 9"  (1.753 m)   Wt 59 kg   SpO2 98%   BMI 19.21 kg/m  Vital signs in last 24 hours: Temp:  [97 F (36.1 C)-99.4 F (37.4 C)] 98.6 F (37 C) (11/22 0839) Pulse Rate:  [60-109] 109 (11/22 0839) Resp:  [11-18] 18 (11/22 0839) BP: (88-146)/(52-92) 134/92 (11/22 0839) SpO2:  [97 %-100 %] 98 % (11/22 0839)   Gen: In bed, comfortable  Resp: non-labored breathing, no grossly audible wheezing Cardiac: Perfusing extremities well  Abd: soft, nt, G-tube in place Skin: No obvious rashes  Psych: Calm  Neuro: MS: Greets examiner in response to greeting, responds to all other questions with "I love you." CN: Left gaze preference, tracks examiner to the right. PERRL, face symmetric, does not protrude tongue Motor: Spontaneous movement of his contracted left upper extremity.  Some slight movement of the right upper extremity, minimal slight movement of the bilateral lower extremities Sensory: Grossly equally reactive to touch in all 4 extremities DTR: Sustained clonus bilateral ankles; spastic throughout  Pertinent Labs: VPA level repeat drawn at 5 AM instead of timed 7 AM as ordered was 89. Not a true trough, but likely represents adequate trough level.  Ammonia 21    Basic Metabolic Panel: Recent Labs  Lab 09/11/21 2024 09/12/21 0630 09/12/21 1229 09/13/21 0426  NA 134* 140 137 140  K 3.9 4.2 3.7 4.3  CL 101 104 110 108  CO2 18* 28 24 26   GLUCOSE 233* 94 82 94  BUN 20 10 8 6   CREATININE 0.97 0.61 0.53* 0.32*  CALCIUM 8.9 8.7* 7.1* 8.6*    CBC: Recent Labs   Lab 09/11/21 2024 09/12/21 0550  WBC 9.7 11.0*  NEUTROABS 7.8*  --   HGB 14.1 13.4  HCT 43.7 40.5  MCV 96.3 94.4  PLT 240 213    Coagulation Studies: Recent Labs    09/12/21 0550  LABPROT 23.8*  INR 2.1*    Elevated LFTs resolving (AST 89 -> 34, ALT 83 -> 61)  CK 536  Impression: Breakthrough seizure of unclear etiology, therefore added a second agent which he seems to be tolerating well.  Appears back to baseline at this time.  He did have some liver dysfunction at the time of arrival for which dantrolene has been discontinued, and his liver function does appear to be improving based on morning labs.  Recommendations:  # Focal epilepsy with breakthrough seizures  - Continue Depakote 1000 mg BID - Continue Keppra 500 mg BID on discharge, can be converted one-to-one from IV to oral/per tube if needed - Continue home baclofen 10 mg 3 times daily as withdrawal of this agent can also lower seizure threshold - Continue home gabapentin 400 mg every 8 hours as this can also act as an antiseizure medication   # Hepatic dysfunction # Coagulopathy on admission (INR elevation) - Continue to hold dantrolene at this time, further adjustment of antispasticity agents per outpatient team - Per primary team  # Potential aspiration  -Antibiotics per primary team  Neurology will be available on an as-needed basis going forward, please reach out if new questions or concerns arise  Brooke Dare MD-PhD [facility] [phone]  [facility] coverage for [facility] is from 8 AM to 4 AM in-house and 4 PM to 8 PM by telephone/video. 8 PM to 8 AM emergent questions or overnight urgent questions should be addressed to Teleneurology On-call or [facility] neurohospitalist; contact information can be found on AMION

## 2021-09-13 NOTE — Discharge Summary (Signed)
Physician Discharge Summary   Patient name: Alex [name]  Admit date:     09/11/2021  Discharge date: 09/13/2021  Discharge Physician: Lynden Oxford   PCP: [facility]   Recommendations at discharge:  PCP: follow up with PCP in 1 week Follow up with neurology as recommended  Follow up with speech therapy and dietitian at SNF Follow up LABS/TEST:  Depakote level in 4 weeks.   Discharge Diagnoses Principal Problem:   Seizure [facility]) Active Problems:   Aspiration into airway   Lactic acidosis   Malnutrition of moderate degree   Low serum cortisol level   Transaminitis   Pancreatic mass   TBI (traumatic brain injury)   Resolved Diagnoses Resolved Problems:   * No resolved hospital problems. [facility] Course   11/20 admitted secondary to seizure. Keppra added to depakote regimen 11/21 neuro consulted.  Mentation improving.  Monitor.   Procedures performed: EEG   Condition at discharge: stable  Exam General: Appear in mild distress, no Rash; Oral Mucosa Clear, moist. no Abnormal Neck Mass Or lumps, Conjunctiva normal  Cardiovascular: S1 and S2 Present, no Murmur, Respiratory: good respiratory effort, Bilateral Air entry present and CTA, no Crackles, no wheezes Abdomen: Bowel Sound present, Soft and no tenderness Extremities: no Pedal edema Neurology: alert and incoherent speech. Left gaze preference. affect appropriate. no new focal deficit Gait not checked due to patient safety concerns    Disposition: Skilled nursing facility  Discharge time: greater than 30 minutes.  Follow-up Information     [facility]. Schedule an appointment as soon as possible for a visit in 1 week(s).   Specialty: Geriatric Medicine Contact information: [address] [place] [address] [phone]         Casilda Carls, MD. Schedule an appointment as soon as possible for a visit in 2 week(s).   Specialty:  Neurology Contact information: [address] [place] [facility] [place] [address] [phone]                 Allergies as of 09/13/2021   No Active Allergies      Medication List     STOP taking these medications    valproic acid 250 MG/5ML solution Commonly known as: DEPAKENE       TAKE these medications    acetaminophen 160 MG/5ML solution Commonly known as: TYLENOL Place 20.3 mLs (650 mg total) into feeding tube every 6 (six) hours as needed for mild pain, moderate pain, headache or fever.   amoxicillin-clavulanate 875-125 MG tablet Commonly known as: AUGMENTIN Place 1 tablet into feeding tube 2 (two) times daily for 4 days.   baclofen 1 mg/mL Soln oral solution Commonly known as: OZOBAX Place 10 mLs (10 mg total) into feeding tube 3 (three) times daily. What changed: Another medication with the same name was removed. Continue taking this medication, and follow the directions you see here.   cholecalciferol 25 MCG (1000 UNIT) tablet Commonly known as: VITAMIN D3 Take 1,000 Units by mouth daily.   dantrolene 25 MG capsule Commonly known as: DANTRIUM Take 1 capsule (25 mg total) by mouth daily.   divalproex 125 MG capsule Commonly known as: DEPAKOTE SPRINKLE Take 1,000 mg by mouth 2 (two) times daily.   feeding supplement (PROSource TF) liquid Place 45 mLs into feeding tube 2 (two) times daily.   feeding supplement (OSMOLITE 1.5 CAL) Liqd Place 237 mLs into feeding tube 5 (five) times daily.  free water Soln Place 200 mLs into feeding tube every 8 (eight) hours.   gabapentin 250 MG/5ML solution Commonly known as: NEURONTIN Place 8 mLs (400 mg total) into feeding tube every 8 (eight) hours.   levETIRAcetam 100 MG/ML solution Commonly known as: KEPPRA Place 5 mLs (500 mg total) into feeding tube 2 (two) times daily.   melatonin 3 MG Tabs tablet Place 2 tablets (6 mg total) into feeding tube at bedtime.   MINERIN CREME EX Apply  1 application topically daily.   nystatin powder Commonly known as: MYCOSTATIN/NYSTOP Apply 1 application topically 2 (two) times daily.   polyethylene glycol 17 g packet Commonly known as: MIRALAX / GLYCOLAX Place 17 g into feeding tube daily.   PRESCRIPTION MEDICATION 120 mLs 3 (three) times daily. Med Plus 2.0   QUEtiapine 25 MG tablet Commonly known as: SEROQUEL Take 75 mg by mouth in the morning.   QUEtiapine 50 MG tablet Commonly known as: SEROQUEL Take 150 mg by mouth at bedtime.   triamcinolone ointment 0.1 % Commonly known as: KENALOG Apply topically 2 (two) times daily.   zinc oxide 20 % ointment Apply 1 application topically as directed. Apply to left flank/left thigh topically every shift        CT Head Wo Contrast  Result Date: 09/11/2021 CLINICAL DATA:  Seizure. Unknown cause. History of traumatic brain injury EXAM: CT HEAD WITHOUT CONTRAST TECHNIQUE: Contiguous axial images were obtained from the base of the skull through the vertex without intravenous contrast. COMPARISON:  CT head 07/09/2021 BRAIN: BRAIN Similar-appearing chronic encephalomalacia of bilateral frontotemporal lobes, right frontal white matter, left frontoparietal lobe. Similar-appearing enlargement of the lateral ventricles and third ventricle. Some of it likely due to ex vacuo dilatation. No evidence of large-territorial acute infarction. No parenchymal hemorrhage. No mass lesion. No extra-axial collection. No mass effect or midline shift. No hydrocephalus. Basilar cisterns are patent. Vascular: No hyperdense vessel. Skull: No acute fracture or focal lesion. Sinuses/Orbits: Mucosal thickening of the left sphenoid, bilateral ethmoid, bilateral frontal, bilateral maxillary sinuses. The orbits are unremarkable. Other: None. IMPRESSION: 1. No acute intracranial abnormality in a patient with Similar-appearing chronic encephalomalacia of bilateral frontotemporal lobes, right frontal white matter, left  frontoparietal lobe. 2. Pansinus disease. Electronically Signed   By: Tish Frederickson M.D.   On: 09/11/2021 22:16   CT CHEST ABDOMEN PELVIS W CONTRAST  Result Date: 09/11/2021 CLINICAL DATA:  seizure tonight. Pt also meets sepsis criteria. EMS gave  of versed and pt is post- ictal at this time. EXAM: CT CHEST, ABDOMEN, AND PELVIS WITH CONTRAST TECHNIQUE: Multidetector CT imaging of the chest, abdomen and pelvis was performed following the standard protocol during bolus administration of intravenous contrast. CONTRAST:  OMNIPAQUE IOHEXOL 300 MG/ML  SOLN COMPARISON:  None. FINDINGS: Ports and Devices: Foley catheter with tip and balloon terminating within the urinary bladder lumen. CHEST: Lungs/airways: No focal consolidation. No pulmonary nodule. No pulmonary mass. No pulmonary contusion or laceration. No pneumatocele formation. Debris within the trachea. Pleura: No pleural effusion. No pneumothorax. No hemothorax. Lymph Nodes: No mediastinal, hilar, or axillary lymphadenopathy. Mediastinum: No pneumomediastinum. No aortic injury or mediastinal hematoma. The thoracic aorta is normal in caliber. The heart is normal in size. No significant pericardial effusion. The esophagus is unremarkable. The thyroid is unremarkable. Chest Wall / Breasts: No chest wall mass. Musculoskeletal: No acute rib or sternal fracture. No spinal fracture. Partially visualized bilateral upper extremities grossly unremarkable. ABDOMEN / PELVIS: Liver: Not enlarged. No focal lesion. No laceration  or subcapsular hematoma. Biliary System: Markedly limited evaluation due to streak artifact. Gallstones within the gallbladder lumen. No gallbladder wall thickening or pericholecystic fluid. No biliary ductal dilatation. Pancreas: Markedly limited evaluation due to streak artifact. Suggestion of a 2.4 cm hypodensity along the pancreatic head (3:60). Normal pancreatic contour. No main pancreatic duct dilatation. Spleen: Not enlarged.  No focal lesion. No laceration, subcapsular hematoma, or vascular injury. Adrenal Glands: No nodularity bilaterally. Kidneys: Bilateral kidneys enhance symmetrically. Subcentimeter hypodensity within the right kidney too small to characterize. No hydronephrosis. No contusion, laceration, or subcapsular hematoma. No injury to the vascular structures or collecting systems. No hydroureter. The urinary bladder is decompressed and grossly unremarkable. Bowel: No small or large bowel wall thickening or dilatation. Stool throughout the colon. The appendix is unremarkable. Mesentery, Omentum, and Peritoneum: No simple free fluid ascites. No pneumoperitoneum. No hemoperitoneum. No mesenteric hematoma identified. No organized fluid collection. Pelvic Organs: Normal. Lymph Nodes: No abdominal, pelvic, inguinal lymphadenopathy. Vasculature: No abdominal aorta or iliac aneurysm. No active contrast extravasation or pseudoaneurysm. Musculoskeletal: No significant soft tissue hematoma. No acute pelvic fracture. No spinal fracture. IMPRESSION: 1. Debris within the trachea. 2. A 2.4 cm hypodensity within the pancreatic head. Question choledochal cyst. Markedly limited evaluation due to streak artifact. Recommend MRI pancreatic protocol further evaluation. When the patient is clinically stable and able to follow directions and hold their breath (preferably as an outpatient) further evaluation with dedicated abdominal MRI should be considered. 3. Cholelithiasis with no CT findings of acute cholecystitis. 4. Constipation. 5. No acute traumatic injury to the chest, abdomen, or pelvis. 6. No acute fracture or traumatic malalignment of the thoracic or lumbar spine. Electronically Signed   By: Tish Frederickson M.D.   On: 09/11/2021 22:29   DG Chest Port 1 View  Result Date: 09/11/2021 CLINICAL DATA:  Sepsis, seizure EXAM: PORTABLE CHEST 1 VIEW COMPARISON:  07/09/2021 FINDINGS: Single frontal view of the chest demonstrates an  unremarkable cardiac silhouette. No acute airspace disease, effusion, or pneumothorax. No acute bony abnormality. IMPRESSION: 1. No acute intrathoracic process. Electronically Signed   By: Sharlet Salina M.D.   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  EEG evaluate for seizure. Level of alertness: lethargic AEDs during EEG study: LEV, VPA, GBP Technical aspects: This EEG study was done with scalp electrodes positioned according to the 10-20 International system of electrode placement. Electrical activity was acquired at a sampling rate of 500Hz  and reviewed with a high frequency filter of 70Hz  and a low frequency filter of 1Hz . EEG data were recorded continuously and digitally stored. Description: EEG showed continuous generalized and lateralized left hemisphere 2 to 3 Hz delta slowing as well as intermittent 4 to 5 Hz theta slowing seen in right hemisphere.  Sharp transients were also seen in right frontal region.  Hyperventilation and photic stimulation were not performed.    ABNORMALITY -Continuous slow, generalized and lateralized left hemisphere  IMPRESSION: This study is suggestive of cortical dysfunction in left hemisphere likely secondary to underlying structural abnormality. Additionally there is moderate to severe diffuse encephalopathy, nonspecific etiology. No seizures or definite epileptiform discharges were seen throughout the recording.    Results for orders placed or  performed during the hospital encounter of 09/11/21  Blood Culture (routine x 2)     Status: None (Preliminary result)   Collection Time: 09/11/21  8:23 PM   Specimen: BLOOD  Result Value Ref  Range Status   Specimen Description BLOOD BLOOD LEFT HAND  Final   Special Requests   Final    BOTTLES DRAWN AEROBIC AND ANAEROBIC Blood Culture results may not be optimal due to an excessive volume of blood received in culture bottles   Culture   Final    NO GROWTH 2 DAYS Performed at [facility], [address] [place], [place] [address]    Report Status PENDING  Incomplete  Resp Panel by RT-PCR (Flu A&B, Covid) Nasopharyngeal Swab     Status: None   Collection Time: 09/11/21  8:24 PM   Specimen: Nasopharyngeal Swab; Nasopharyngeal(NP) swabs in vial transport medium  Result Value Ref Range Status   SARS Coronavirus 2 by RT PCR NEGATIVE NEGATIVE Final    Comment: (NOTE) SARS-CoV-2 target nucleic acids are NOT DETECTED.  The SARS-CoV-2 RNA is generally detectable in upper respiratory specimens during the acute phase of infection. The lowest concentration of SARS-CoV-2 viral copies this assay can detect is 138 copies/mL. A negative result does not preclude SARS-Cov-2 infection and should not be used as the sole basis for treatment or other patient management decisions. A negative result may occur with  improper specimen collection/handling, submission of specimen other than nasopharyngeal swab, presence of viral mutation(s) within the areas targeted by this assay, and inadequate number of viral copies(<138 copies/mL). A negative result must be combined with clinical observations, patient history, and epidemiological information. The expected result is Negative.  Fact Sheet for Patients:  BloggerCourse.com  Fact Sheet for Healthcare Providers:  SeriousBroker.it  This test is no t yet approved or cleared by the [place] FDA and  has been authorized for detection and/or diagnosis of SARS-CoV-2 by FDA under an Emergency Use Authorization (EUA). This EUA will remain  in effect (meaning this test can be used) for the  duration of the COVID-19 declaration under Section 564(b)(1) of the Act, 21 U.S.C.section 360bbb-3(b)(1), unless the authorization is terminated  or revoked sooner.       Influenza A by PCR NEGATIVE NEGATIVE Final   Influenza B by PCR NEGATIVE NEGATIVE Final    Comment: (NOTE) The Xpert Xpress SARS-CoV-2/FLU/RSV plus assay is intended as an aid in the diagnosis of influenza from Nasopharyngeal swab specimens and should not be used as a sole basis for treatment. Nasal washings and aspirates are unacceptable for Xpert Xpress SARS-CoV-2/FLU/RSV testing.  Fact Sheet for Patients: BloggerCourse.com  Fact Sheet for Healthcare Providers: SeriousBroker.it  This test is not yet approved or cleared by the [place] FDA and has been authorized for detection and/or diagnosis of SARS-CoV-2 by FDA under an Emergency Use Authorization (EUA). This EUA will remain in effect (meaning this test can be used) for the duration of the COVID-19 declaration under Section 564(b)(1) of the Act, 21 U.S.C. section 360bbb-3(b)(1), unless the authorization is terminated or revoked.  Performed at [facility], [address], [place], [place] [address]   Blood Culture (routine x 2)     Status: None (Preliminary result)   Collection Time: 09/11/21  8:39 PM   Specimen: BLOOD  Result Value Ref Range Status   Specimen Description BLOOD BLOOD LEFT HAND  Final   Special Requests   Final    BOTTLES DRAWN AEROBIC AND ANAEROBIC Blood Culture  results may not be optimal due to an excessive volume of blood received in culture bottles   Culture   Final    NO GROWTH 2 DAYS Performed at [facility], [address] [place], [place] [address]    Report Status PENDING  Incomplete  Urine Culture     Status: None   Collection Time: 09/11/21  8:39 PM   Specimen: In/Out Cath Urine  Result Value Ref Range Status   Specimen Description   Final     IN/OUT CATH URINE Performed at [facility], [address] [place], [place] [address]    Special Requests   Final    NONE Performed at [facility], [address], [place], [place] [address]    Culture   Final    NO GROWTH Performed at [facility], [address], [place], [place] [address]    Report Status 09/12/2021 FINAL  Final    Signed:  Lynden Oxford MD.  [facility] 09/13/2021, 1:35 PM

## 2021-09-13 NOTE — Progress Notes (Signed)
Nutrition Follow-up  DOCUMENTATION CODES:   Not applicable  INTERVENTION:   -Continue dysphagia 1 diet with thin liquids -Continue bolus feedings:    237 ml Osmolite 1.5 5 times daily via PEG   45 ml Prosource TF BID   Complete TF regimen 1855 kcals, 97 grams protein, and 905 ml free water daily   NUTRITION DIAGNOSIS:   Increased nutrient needs related to chronic illness (TBI) as evidenced by estimated needs.  Ongoing  GOAL:   Patient will meet greater than or equal to 90% of their needs  Met with TF  MONITOR:   PO intake, Supplement acceptance, Labs, Weight trends, TF tolerance, Skin, I & O's  REASON FOR ASSESSMENT:   Rounds    ASSESSMENT:   Alex [name] is a 25 y.o. Caucasian unlocked male with medical history significant for traumatic brain injury and seizure disorder, status post PEG tube, who presented to emergency room with acute onset of seizure that lasted about 20 minutes at her SNF.  This was aborted by IV Versed given by EMS.  He was noted to be febrile and tachycardic by EMS.  He was also reportedly hypoxic with pulse oximetry of 85% on room air for which she was placed on nasal cannula.  No history could be obtained from the patient as he was nonverbal.  Reviewed I/O's: -433 ml x 24 hours and +383 ml since admission  UOP: 2 L x 24 hours  Reviewed MAR from [facility]. He consumes a dysphagia 1 diet for comfort, but also receives TF via PEG. Home TF regimen is 237 ml Osmolite 1.5 5 times daily and 45 ml Prosource TF BID, which provides 1855 kcals, 97 grams protein, and 905 ml free water daily.  Pt unable to provide history. He did not engage with this RD.   Pt consumed 100% of diet per meal completions.   Per TOC notes, plan to d/c back to SNF today.    Medications reviewed and include vitamin D3, melatonin, miralax.   Labs reviewed: CBGS: 72.   NUTRITION - FOCUSED PHYSICAL EXAM:  Flowsheet Row Most Recent Value  Orbital Region  No depletion  Upper Arm Region Mild depletion  Thoracic and Lumbar Region No depletion  Buccal Region No depletion  Temple Region No depletion  Clavicle Bone Region Mild depletion  Clavicle and Acromion Bone Region No depletion  Scapular Bone Region No depletion  Dorsal Hand No depletion  Patellar Region Moderate depletion  Anterior Thigh Region Moderate depletion  Posterior Calf Region Moderate depletion  Edema (RD Assessment) None  Hair Reviewed  Eyes Reviewed  Mouth Reviewed  Skin Reviewed  Nails Reviewed       Diet Order:   Diet Order             DIET - DYS 1           DIET - DYS 1 Room service appropriate? Yes; Fluid consistency: Thin  Diet effective now                   EDUCATION NEEDS:   No education needs have been identified at this time  Skin:  Skin Assessment: Reviewed RN Assessment  Last BM:  Unknown  Height:   Ht Readings from Last 1 Encounters:  09/11/21 _0  (1.753 m)    Weight:   Wt Readings from Last 1 Encounters:  09/11/21 59 kg    Ideal Body Weight:  72.7 kg  BMI:  Body mass index  is 19.21 kg/m.  Estimated Nutritional Needs:   Kcal:  3567-0141  Protein:  90-105 grams  Fluid:  > 1.7 L    Loistine Chance, RD, LDN, [place] Registered Dietitian II Certified Diabetes Care and Education Specialist Please refer to [facility] for RD and/or RD on-call/weekend/after hours pager

## 2021-09-16 LAB — CULTURE, BLOOD (ROUTINE X 2)
Culture: NO GROWTH
Culture: NO GROWTH

## 2021-11-30 ENCOUNTER — Emergency Department: Payer: Medicaid Other

## 2021-11-30 ENCOUNTER — Encounter: Payer: Self-pay | Admitting: Emergency Medicine

## 2021-11-30 ENCOUNTER — Other Ambulatory Visit: Payer: Self-pay

## 2021-11-30 ENCOUNTER — Ambulatory Visit (HOSPITAL_COMMUNITY)
Admission: AD | Admit: 2021-11-30 | Discharge: 2021-11-30 | Disposition: A | Discharge status: Home / Self Care | Payer: Medicaid Other | Source: Other Acute Inpatient Hospital | Attending: Emergency Medicine | Admitting: Emergency Medicine

## 2021-11-30 ENCOUNTER — Emergency Department
Admission: EM | Admit: 2021-11-30 | Discharge: 2021-11-30 | Disposition: A | Discharge status: Other Acute Inpatient Hospital | Payer: Medicaid Other | Attending: Emergency Medicine | Admitting: Emergency Medicine

## 2021-11-30 DIAGNOSIS — R569 Unspecified convulsions: Secondary | ICD-10-CM | POA: Diagnosis present

## 2021-11-30 DIAGNOSIS — Z20822 Contact with and (suspected) exposure to covid-19: Secondary | ICD-10-CM | POA: Diagnosis not present

## 2021-11-30 DIAGNOSIS — R402432 Glasgow coma scale score 3-8, at arrival to emergency department: Secondary | ICD-10-CM | POA: Insufficient documentation

## 2021-11-30 DIAGNOSIS — G40901 Epilepsy, unspecified, not intractable, with status epilepticus: Secondary | ICD-10-CM | POA: Diagnosis not present

## 2021-11-30 LAB — BLOOD GAS, ARTERIAL
Acid-Base Excess: 5.2 mmol/L — ABNORMAL HIGH (ref 0.0–2.0)
Acid-Base Excess: 0.9 mmol/L (ref 0.0–2.0)
Bicarbonate: 27.4 mmol/L (ref 20.0–28.0)
Bicarbonate: 23.1 mmol/L (ref 20.0–28.0)
FIO2: 0.3
FIO2: 0.21
MECHVT: 450 mL
MECHVT: 400 mL
Mechanical Rate: 16
O2 Saturation: 99.3 %
O2 Saturation: 98.7 %
PEEP: 5 cmH2O
PEEP: 5 cmH2O
Patient temperature: 37
Patient temperature: 37
RATE: 16 resp/min
pCO2 arterial: 32 mmHg (ref 32.0–48.0)
pCO2 arterial: 29 mmHg — ABNORMAL LOW (ref 32.0–48.0)
pH, Arterial: 7.54 — ABNORMAL HIGH (ref 7.350–7.450)
pH, Arterial: 7.51 — ABNORMAL HIGH (ref 7.350–7.450)
pO2, Arterial: 133 mmHg — ABNORMAL HIGH (ref 83.0–108.0)
pO2, Arterial: 109 mmHg — ABNORMAL HIGH (ref 83.0–108.0)

## 2021-11-30 LAB — URINALYSIS, COMPLETE (UACMP) WITH MICROSCOPIC
Bacteria, UA: NONE SEEN
Bilirubin Urine: NEGATIVE
Glucose, UA: NEGATIVE mg/dL
Hgb urine dipstick: NEGATIVE
Ketones, ur: NEGATIVE mg/dL
Leukocytes,Ua: NEGATIVE
Nitrite: NEGATIVE
Protein, ur: NEGATIVE mg/dL
Specific Gravity, Urine: 1.019 (ref 1.005–1.030)
pH: 7 (ref 5.0–8.0)

## 2021-11-30 LAB — LACTIC ACID, PLASMA
Lactic Acid, Venous: 6.6 mmol/L (ref 0.5–1.9)
Lactic Acid, Venous: 2.4 mmol/L (ref 0.5–1.9)

## 2021-11-30 LAB — CBC WITH DIFFERENTIAL/PLATELET
Abs Immature Granulocytes: 0.06 10*3/uL (ref 0.00–0.07)
Basophils Absolute: 0 10*3/uL (ref 0.0–0.1)
Basophils Relative: 1 %
Eosinophils Absolute: 0.7 10*3/uL — ABNORMAL HIGH (ref 0.0–0.5)
Eosinophils Relative: 13 %
HCT: 45.4 % (ref 39.0–52.0)
Hemoglobin: 14.8 g/dL (ref 13.0–17.0)
Immature Granulocytes: 1 %
Lymphocytes Relative: 25 %
Lymphs Abs: 1.4 10*3/uL (ref 0.7–4.0)
MCH: 30.6 pg (ref 26.0–34.0)
MCHC: 32.6 g/dL (ref 30.0–36.0)
MCV: 93.8 fL (ref 80.0–100.0)
Monocytes Absolute: 0.5 10*3/uL (ref 0.1–1.0)
Monocytes Relative: 9 %
Neutro Abs: 2.9 10*3/uL (ref 1.7–7.7)
Neutrophils Relative %: 51 %
Platelets: 219 10*3/uL (ref 150–400)
RBC: 4.84 MIL/uL (ref 4.22–5.81)
RDW: 12.4 % (ref 11.5–15.5)
WBC: 5.6 10*3/uL (ref 4.0–10.5)
nRBC: 0 % (ref 0.0–0.2)

## 2021-11-30 LAB — RESP PANEL BY RT-PCR (FLU A&B, COVID) ARPGX2
Influenza A by PCR: NEGATIVE
Influenza B by PCR: NEGATIVE
SARS Coronavirus 2 by RT PCR: NEGATIVE

## 2021-11-30 LAB — COMPREHENSIVE METABOLIC PANEL
ALT: 42 U/L (ref 0–44)
AST: 45 U/L — ABNORMAL HIGH (ref 15–41)
Albumin: 3.7 g/dL (ref 3.5–5.0)
Alkaline Phosphatase: 77 U/L (ref 38–126)
Anion gap: 13 (ref 5–15)
BUN: 14 mg/dL (ref 6–20)
CO2: 22 mmol/L (ref 22–32)
Calcium: 8.9 mg/dL (ref 8.9–10.3)
Chloride: 103 mmol/L (ref 98–111)
Creatinine, Ser: 0.92 mg/dL (ref 0.61–1.24)
GFR, Estimated: >60 mL/min (ref >60)
Glucose, Bld: 95 mg/dL (ref 70–99)
Potassium: 4 mmol/L (ref 3.5–5.1)
Sodium: 138 mmol/L (ref 135–145)
Total Bilirubin: 0.6 mg/dL (ref 0.3–1.2)
Total Protein: 6.7 g/dL (ref 6.5–8.1)

## 2021-11-30 MED ORDER — ROCURONIUM BROMIDE 10 MG/ML (PF) SYRINGE: 80.0000 mg | PREFILLED_SYRINGE | Freq: Once | INTRAVENOUS | Status: AC

## 2021-11-30 MED ORDER — SODIUM CHLORIDE 0.9 % IV BOLUS
1000.0000 mL | Freq: Once | INTRAVENOUS | Status: AC
  Administered 2021-11-30: 1000 mL via INTRAVENOUS

## 2021-11-30 MED ORDER — MIDAZOLAM BOLUS VIA INFUSION
1.0000 mg | INTRAVENOUS | Status: DC | PRN
  Filled 2021-11-30: qty 2

## 2021-11-30 MED ORDER — MIDAZOLAM HCL 2 MG/2ML IJ SOLN
INTRAMUSCULAR | Status: AC
  Filled 2021-11-30: qty 4

## 2021-11-30 MED ORDER — METRONIDAZOLE 500 MG/100ML IV SOLN
500.0000 mg | Freq: Once | INTRAVENOUS | Status: AC
  Administered 2021-11-30: 500 mg via INTRAVENOUS
  Filled 2021-11-30: qty 100

## 2021-11-30 MED ORDER — KETAMINE HCL 50 MG/5ML IJ SOSY
80.0000 mg | PREFILLED_SYRINGE | Freq: Once | INTRAMUSCULAR | Status: AC
  Administered 2021-11-30: 80 mg via INTRAVENOUS

## 2021-11-30 MED ORDER — KETAMINE HCL 10 MG/ML IJ SOLN
INTRAMUSCULAR | Status: AC
  Filled 2021-11-30: qty 1

## 2021-11-30 MED ORDER — FENTANYL 2500MCG IN NS 250ML (10MCG/ML) PREMIX INFUSION
25.0000 ug/h | INTRAVENOUS | Status: DC
  Administered 2021-11-30: 50 ug/h via INTRAVENOUS

## 2021-11-30 MED ORDER — FENTANYL CITRATE PF 50 MCG/ML IJ SOSY
50.0000 ug | PREFILLED_SYRINGE | Freq: Once | INTRAMUSCULAR | Status: AC
  Administered 2021-11-30: 50 ug via INTRAVENOUS

## 2021-11-30 MED ORDER — VALPROATE SODIUM 100 MG/ML IV SOLN
1500.0000 mg | Freq: Once | INTRAVENOUS | Status: AC
  Administered 2021-11-30: 1500 mg via INTRAVENOUS
  Filled 2021-11-30: qty 15

## 2021-11-30 MED ORDER — FENTANYL BOLUS VIA INFUSION
50.0000 ug | INTRAVENOUS | Status: DC | PRN
  Filled 2021-11-30: qty 50

## 2021-11-30 MED ORDER — LACTATED RINGERS IV SOLN: INTRAVENOUS | Status: DC

## 2021-11-30 MED ORDER — FENTANYL 2500MCG IN NS 250ML (10MCG/ML) PREMIX INFUSION
INTRAVENOUS | Status: AC
  Administered 2021-11-30: 5 ug/h
  Filled 2021-11-30: qty 250

## 2021-11-30 MED ORDER — MIDAZOLAM-SODIUM CHLORIDE 100-0.9 MG/100ML-% IV SOLN
0.0000 mg/h | INTRAVENOUS | Status: DC
  Administered 2021-11-30: 6 mg/h via INTRAVENOUS

## 2021-11-30 MED ORDER — SODIUM CHLORIDE 0.9 % IV BOLUS (SEPSIS)
1000.0000 mL | Freq: Once | INTRAVENOUS | Status: AC
  Administered 2021-11-30: 1000 mL via INTRAVENOUS

## 2021-11-30 MED ORDER — VANCOMYCIN HCL IN DEXTROSE 1-5 GM/200ML-% IV SOLN
1000.0000 mg | Freq: Once | INTRAVENOUS | Status: AC
  Administered 2021-11-30: 1000 mg via INTRAVENOUS
  Filled 2021-11-30: qty 200

## 2021-11-30 MED ORDER — SODIUM CHLORIDE 0.9 % IV SOLN
2.0000 g | Freq: Once | INTRAVENOUS | Status: AC
  Administered 2021-11-30: 2 g via INTRAVENOUS
  Filled 2021-11-30: qty 2

## 2021-11-30 MED ORDER — ROCURONIUM BROMIDE 10 MG/ML (PF) SYRINGE
PREFILLED_SYRINGE | INTRAVENOUS | Status: AC
  Administered 2021-11-30: 80 mg via INTRAVENOUS
  Filled 2021-11-30: qty 10

## 2021-11-30 MED ORDER — MIDAZOLAM-SODIUM CHLORIDE 100-0.9 MG/100ML-% IV SOLN
0.5000 mg/h | INTRAVENOUS | Status: DC
  Administered 2021-11-30: 0.5 mg/h via INTRAVENOUS

## 2021-11-30 NOTE — ED Notes (Signed)
Critical Result: Lactic 6.6  Bradler, MD aware

## 2021-11-30 NOTE — ED Notes (Signed)
Patient to CT with RN, RT, and NT.

## 2021-11-30 NOTE — ED Provider Notes (Signed)
[facility] Provider Note    Event Date/Time   First MD Initiated Contact with Patient 11/30/21 1638     (approximate)   History   Seizures   HPI Alex [name] is a 26 y.o. 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  EMS notes patient got a milligram of Ativan at the facility that did not stop his seizure-like activity.  EMS gave 3 separate doses of 2 mg of Versed with persistence of patient's seizure activity.  Patient arrives with GCS of 6.  Further history and review of systems are unable to be obtained at this time secondary to mental status     Physical Exam   Triage Vital Signs: ED Triage Vitals  Enc Vitals Group     BP 11/30/21 1613 104/61     Pulse Rate 11/30/21 1613 (!) 125     Resp 11/30/21 1613 (!) 22     Temp 11/30/21 1613 (!) 101.9 F (38.8 C)     Temp Source 11/30/21 1613 Rectal     SpO2 11/30/21 1610 98 %     Weight --      Height --      Head Circumference --      Peak Flow --      Pain Score 11/30/21 1614 0     Pain Loc --      Pain Edu? --      Excl. in [place]? --     Most recent vital signs: Vitals:   11/30/21 2030 11/30/21 2040  BP: 107/82 119/86  Pulse: 64 61  Resp: 18 (!) 21  Temp: (!) 96.5 F (35.8 C) (!) 96.5 F (35.8 C)  SpO2: 100% 100%    General: Asleep on stretcher with nonrebreather in place CV:  Good peripheral perfusion. Resp:  Normal effort. Abd:  No distention. Other:  GCS 6 on arrival.  Mild tremulous activity to the left upper extremity  ED Results / Procedures / Treatments   Labs (all labs ordered are listed, but only abnormal results are displayed) Labs Reviewed  BLOOD GAS, ARTERIAL - Abnormal; Notable for the following components:      Result Value   pH, Arterial 7.54 (*)    pO2, Arterial 133 (*)    Acid-Base Excess 5.2 (*)    All other  components within normal limits  LACTIC ACID, PLASMA - Abnormal; Notable for the following components:   Lactic Acid, Venous 6.6 (*)    All other components within normal limits  LACTIC ACID, PLASMA - Abnormal; Notable for the following components:   Lactic Acid, Venous 2.4 (*)    All other components within normal limits  COMPREHENSIVE METABOLIC PANEL - Abnormal; Notable for the following components:   AST 45 (*)    All other components within normal limits  CBC WITH DIFFERENTIAL/PLATELET - Abnormal; Notable for the following components:   Eosinophils Absolute 0.7 (*)    All other components within normal limits  URINALYSIS, COMPLETE (UACMP) WITH MICROSCOPIC - Abnormal; Notable for the following components:   Color, Urine YELLOW (*)    APPearance CLEAR (*)    All other components within normal limits  BLOOD GAS, ARTERIAL - Abnormal; Notable for the following components:   pH, Arterial 7.51 (*)    pCO2 arterial 29 (*)    pO2, Arterial 109 (*)  All other components within normal limits  RESP PANEL BY RT-PCR (FLU A&B, COVID) [id number]  CULTURE, BLOOD (ROUTINE X 2)  CULTURE, BLOOD (ROUTINE X 2)  URINE CULTURE  PROTIME-INR  APTT     EKG ED ECG REPORT I, Naaman Plummer, the attending physician, personally viewed and interpreted this ECG.  Date: 11/30/2021 EKG Time: 1610 Rate: 123 Rhythm: normal sinus rhythm QRS Axis: normal Intervals: normal ST/T Wave abnormalities: normal Narrative Interpretation: no evidence of acute ischemia   RADIOLOGY ED MD interpretation: CT of the head without contrast shows no evidence of acute intracranial abnormalities however does show extensive bilateral encephalomalacia specifically involving the inferior frontal and bilateral temporal lobes and is likely consequence from his previous traumatic brain injury  Single view portable x-ray of the chest shows low position of the endotracheal tube and no other acute abnormalities.  Agree with  radiology assessment  Official radiology report(s): CT Head Wo Contrast  Result Date: 11/30/2021 CLINICAL DATA:  Status epilepticus EXAM: CT HEAD WITHOUT CONTRAST TECHNIQUE: Contiguous axial images were obtained from the base of the skull through the vertex without intravenous contrast. RADIATION DOSE REDUCTION: This exam was performed according to the departmental dose-optimization program which includes automated exposure control, adjustment of the mA and/or kV according to patient size and/or use of iterative reconstruction technique. COMPARISON:  CT brain 09/11/2021 FINDINGS: Brain: No territorial infarction, hemorrhage or intracranial mass. Extensive bilateral encephalomalacia, greatest involving the inferior frontal and bilateral temporal lobes consistent with traumatic brain injury. Smaller foci of encephalomalacia within the frontal lobes near the vertex. Generalized atrophy. Moderate ventricular enlargement grossly stable as compared with most recent priors, slightly increased compared to more remote priors possibly due to atrophy progression. Vascular: No hyperdense vessels.  No unexpected calcification Skull: Normal. Negative for fracture or focal lesion. Sinuses/Orbits: Moderate mucosal thickening the sinuses Other: None IMPRESSION: 1. No definite CT evidence for acute intracranial abnormality. 2. Extensive bilateral encephalomalacia, greatest involving the inferior frontal and bilateral temporal lobes consistent with traumatic brain injury. Electronically Signed   By: Donavan Foil M.D.   On: 11/30/2021 17:48   DG Chest Port 1 View  Result Date: 11/30/2021 CLINICAL DATA:  Possible sepsis. EXAM: PORTABLE CHEST 1 VIEW COMPARISON:  09/11/2021 FINDINGS: Endotracheal tube with its tip 2.1 cm above the carina. Nasogastric tube extending into the stomach with the side hole visualized in the proximal to mid stomach. Normal sized heart. Clear lungs. Unremarkable bones. IMPRESSION: 1. Somewhat low position  of the endotracheal tube. It is recommended that this be retracted 3 cm. 2. No acute abnormality. Electronically Signed   By: Claudie Revering M.D.   On: 11/30/2021 17:09      PROCEDURES:  Critical Care performed: Yes, see critical care procedure note(s)  Procedure Name: Intubation Date/Time: 11/30/2021 8:59 PM Performed by: Naaman Plummer, MD Pre-anesthesia Checklist: Patient identified, Patient being monitored, Emergency Drugs available, Timeout performed and Suction available Oxygen Delivery Method: Non-rebreather mask Preoxygenation: Pre-oxygenation with 100% oxygen Induction Type: Rapid sequence Ventilation: Mask ventilation without difficulty Laryngoscope Size: Glidescope Grade View: Grade I Tube size: 7.5 mm Number of attempts: 1 Airway Equipment and Method: Video-laryngoscopy Placement Confirmation: ETT inserted through vocal cords under direct vision, CO2 detector and Breath sounds checked- equal and bilateral Secured at: 25 cm Tube secured with: ETT holder Dental Injury: Teeth and Oropharynx as per pre-operative assessment     .1-3 Lead EKG Interpretation Performed by: Naaman Plummer, MD Authorized by: Naaman Plummer, MD  Interpretation: normal     ECG rate:  62   ECG rate assessment: normal     Rhythm: sinus rhythm     Ectopy: none     Conduction: normal    CRITICAL CARE Performed by: Naaman Plummer   Total critical care time: 47 minutes  Critical care time was exclusive of separately billable procedures and treating other patients.  Critical care was necessary to treat or prevent imminent or life-threatening deterioration.  Critical care was time spent personally by me on the following activities: development of treatment plan with patient and/or surrogate as well as nursing, discussions with consultants, evaluation of patient's response to treatment, examination of patient, obtaining history from patient or surrogate, ordering and performing treatments  and interventions, ordering and review of laboratory studies, ordering and review of radiographic studies, pulse oximetry and re-evaluation of patient's condition.   MEDICATIONS ORDERED IN ED: Medications  ketamine (KETALAR) 10 MG/ML injection (  Not Given 11/30/21 1624)  midazolam (VERSED) 2 MG/2ML injection (  Not Given 11/30/21 1639)  midazolam (VERSED) 100 mg/100 mL (1 mg/mL) premix infusion (0 mg/hr Intravenous Stopped 11/30/21 1748)  lactated ringers infusion ( Intravenous New Bag/Given 11/30/21 1919)  fentaNYL 258mcg in NS 240mL (62mcg/ml) infusion-PREMIX (50 mcg/hr Intravenous New Bag/Given 11/30/21 1751)  fentaNYL (SUBLIMAZE) bolus via infusion 50 mcg (has no administration in time range)  midazolam (VERSED) 100 mg/100 mL (1 mg/mL) premix infusion (6 mg/hr Intravenous New Bag/Given 11/30/21 1748)  midazolam (VERSED) bolus via infusion 1-2 mg (has no administration in time range)  rocuronium bromide 10 mg/mL (PF) syringe (80 mg Intravenous Given 11/30/21 1622)  ketamine 50 mg in normal saline 5 mL (10 mg/mL) syringe (80 mg Intravenous Given 11/30/21 1622)  sodium chloride 0.9 % bolus 1,000 mL (0 mLs Intravenous Stopped 11/30/21 1938)  sodium chloride 0.9 % bolus 1,000 mL (0 mLs Intravenous Stopped 11/30/21 1918)  ceFEPIme (MAXIPIME) 2 g in sodium chloride 0.9 % 100 mL IVPB (0 g Intravenous Stopped 11/30/21 1751)  metroNIDAZOLE (FLAGYL) IVPB 500 mg (0 mg Intravenous Stopped 11/30/21 1854)  vancomycin (VANCOCIN) IVPB 1000 mg/200 mL premix (0 mg Intravenous Stopped 11/30/21 1905)  valproate (DEPACON) 1,500 mg in dextrose 5 % 50 mL IVPB (0 mg Intravenous Stopped 11/30/21 1853)  fentaNYL (SUBLIMAZE) injection 50 mcg (50 mcg Intravenous Given 11/30/21 1720)     IMPRESSION / MDM / ASSESSMENT AND PLAN / ED COURSE  I reviewed the triage vital signs and the nursing notes.                              Differential diagnosis includes, but is not limited to, status epilepticus, aspiration, intracranial hemorrhage,  medication nonadherence  The patient is on the cardiac monitor to evaluate for evidence of arrhythmia and/or significant heart rate changes.  Patient is a 26 year old male with the above-stated past medical history and HPI.  Patient presents still not back at his baseline as EMS described it from his facility.  Patient did have some mild tremulous activity to the left upper extremity and given his GCS, patient was intubated for airway protection.  Please see procedure note for full details.  Patient's laboratory evaluation significant for lactic acidosis initially of 6.6 that down trended to 2.4 after fluid administration.  CBC does not show any evidence of leukocytosis.  CMP does not show any evidence of acute abnormalities.  UA does not show any evidence of urinary tract infection, proteinuria,  or hematuria.  Patient's head CT shows no new evidence of acute abnormalities.  Patient's chest x-ray showed ET tube was somewhat deep and retracted 2 cm.  Patient's ventilator titrated per respiratory therapy with my consult. Consults: I spoke with [facility] neurologic ICU team who agrees to accept this patient in transfer to their facility.  They agreed with administration of 1500 mg Depakote.  And up titration of patient's Versed drip for any continued seizure activity.  Dispo: Transfer to [facility] neurologic ICU      FINAL CLINICAL IMPRESSION(S) / ED DIAGNOSES   Final diagnoses:  Status epilepticus ([place])  Glasgow coma scale total score 3-8, at arrival to emergency department [facility])     Rx / DC Orders   ED Discharge Orders     None        Note:  This document was prepared using Dragon voice recognition software and may include unintentional dictation errors.   Naaman Plummer, MD 11/30/21 2106

## 2021-11-30 NOTE — Consult Note (Signed)
CODE SEPSIS - PHARMACY COMMUNICATION  **Broad Spectrum Antibiotics should be administered within 1 hour of Sepsis diagnosis**  Time Code Sepsis Called/Page Received: 1648  Antibiotics Ordered: 1648  Time of 1st antibiotic administration: 1657  Additional action taken by pharmacy: N/A  If necessary, Name of Provider/Nurse Contacted: N/A    Derrek Gu ,PharmD Clinical Pharmacist  11/30/2021  5:04 PM

## 2021-11-30 NOTE — ED Notes (Signed)
Lab called to check status of labwork sent earlier. Per lab the orders were not in when they received the blood. They will run it now. Will inform MD

## 2021-11-30 NOTE — Sepsis Progress Note (Signed)
eLink monitoring code sepsis.  

## 2021-11-30 NOTE — ED Notes (Signed)
Patient back to room from CT

## 2021-11-30 NOTE — ED Triage Notes (Signed)
Patient to ED via [facility] from [facility] for multiple seizures. Patient was given 1mg  Ativan at facility. Given total of 6mg  Versed from EMS.

## 2021-11-30 NOTE — ED Notes (Signed)
Accepted to [facility] Neuro ICU bed 2726 report [phone] opt#[id number]

## 2021-11-30 NOTE — ED Notes (Signed)
Consent to transfer printed and signed by this RN due to pt being intubated and family not present. Signed consent to transfer form placed in medical record pick-up.

## 2021-11-30 NOTE — ED Notes (Signed)
Accepted to [facility] patient on wait list  [phone]

## 2021-11-30 NOTE — ED Notes (Signed)
EMTALA reviewed by this RN.  Pt unable to sign d/t intubated status.

## 2021-11-30 NOTE — Progress Notes (Addendum)
ETT pulled back 1 cm to 25 based on CXR results per MD  Pt transported to and from CT with RN without incident

## 2021-11-30 NOTE — ED Notes (Signed)
[facility]  TRANSFER [facility]  CALLED  PER  DR  Vicente Males  MD  Kirkland Hun  WITH  [facility]

## 2021-11-30 NOTE — Consult Note (Signed)
PHARMACY -  BRIEF ANTIBIOTIC NOTE   Pharmacy has received consult(s) for vancomycin and cefepime from an ED provider.  The patient's profile has been reviewed for ht/wt/allergies/indication/available labs.    One time order(s) placed for cefepime 2 g and vancomycin 1 g (no weight entered)  Further antibiotics/pharmacy consults should be ordered by admitting physician if indicated.                       Thank you, Darnelle Bos, PharmD 11/30/2021  5:06 PM

## 2021-11-30 NOTE — Progress Notes (Signed)
FiO2 reduced to 21%, Vt reduced to 400 per ABG results

## 2021-11-30 NOTE — ED Notes (Signed)
[facility] called for transport. 

## 2021-11-30 NOTE — Sepsis Progress Note (Signed)
Notified bedside nurse of need to draw repeat lactic acid. 

## 2021-12-02 LAB — URINE CULTURE: Culture: NO GROWTH

## 2021-12-05 LAB — CULTURE, BLOOD (ROUTINE X 2)
Culture: NO GROWTH
Culture: NO GROWTH
Special Requests: ADEQUATE
Special Requests: ADEQUATE

## 2022-04-10 ENCOUNTER — Emergency Department: Payer: Medicaid Other

## 2022-04-10 ENCOUNTER — Emergency Department
Admission: EM | Admit: 2022-04-10 | Discharge: 2022-04-10 | Disposition: A | Discharge status: Other Acute Inpatient Hospital | Payer: Medicaid Other | Attending: Emergency Medicine | Admitting: Emergency Medicine

## 2022-04-10 ENCOUNTER — Inpatient Hospital Stay (HOSPITAL_COMMUNITY)
Admission: AD | Admit: 2022-04-10 | Discharge: 2022-04-20 | DRG: 100 | Disposition: A | Discharge status: Skilled Nursing Facility | Payer: Medicaid Other | Source: Other Acute Inpatient Hospital | Attending: Family Medicine | Admitting: Family Medicine

## 2022-04-10 DIAGNOSIS — G40802 Other epilepsy, not intractable, without status epilepticus: Secondary | ICD-10-CM | POA: Insufficient documentation

## 2022-04-10 DIAGNOSIS — Z87820 Personal history of traumatic brain injury: Secondary | ICD-10-CM

## 2022-04-10 DIAGNOSIS — G928 Other toxic encephalopathy: Secondary | ICD-10-CM | POA: Diagnosis present

## 2022-04-10 DIAGNOSIS — Y92239 Unspecified place in hospital as the place of occurrence of the external cause: Secondary | ICD-10-CM | POA: Diagnosis present

## 2022-04-10 DIAGNOSIS — J9600 Acute respiratory failure, unspecified whether with hypoxia or hypercapnia: Secondary | ICD-10-CM

## 2022-04-10 DIAGNOSIS — J9601 Acute respiratory failure with hypoxia: Secondary | ICD-10-CM | POA: Diagnosis present

## 2022-04-10 DIAGNOSIS — R569 Unspecified convulsions: Principal | ICD-10-CM

## 2022-04-10 DIAGNOSIS — Z20822 Contact with and (suspected) exposure to covid-19: Secondary | ICD-10-CM | POA: Insufficient documentation

## 2022-04-10 DIAGNOSIS — R509 Fever, unspecified: Secondary | ICD-10-CM | POA: Diagnosis not present

## 2022-04-10 DIAGNOSIS — G40901 Epilepsy, unspecified, not intractable, with status epilepticus: Secondary | ICD-10-CM

## 2022-04-10 DIAGNOSIS — T4275XA Adverse effect of unspecified antiepileptic and sedative-hypnotic drugs, initial encounter: Secondary | ICD-10-CM | POA: Diagnosis present

## 2022-04-10 DIAGNOSIS — Z79899 Other long term (current) drug therapy: Secondary | ICD-10-CM | POA: Diagnosis not present

## 2022-04-10 DIAGNOSIS — S069XAA Unspecified intracranial injury with loss of consciousness status unknown, initial encounter: Secondary | ICD-10-CM

## 2022-04-10 LAB — VALPROIC ACID LEVEL: Valproic Acid Lvl: 39 ug/mL — ABNORMAL LOW (ref 50.0–100.0)

## 2022-04-10 LAB — COMPREHENSIVE METABOLIC PANEL
ALT: 25 U/L (ref 0–44)
AST: 36 U/L (ref 15–41)
Albumin: 4 g/dL (ref 3.5–5.0)
Alkaline Phosphatase: 84 U/L (ref 38–126)
Anion gap: 16 — ABNORMAL HIGH (ref 5–15)
BUN: 13 mg/dL (ref 6–20)
CO2: 19 mmol/L — ABNORMAL LOW (ref 22–32)
Calcium: 9.1 mg/dL (ref 8.9–10.3)
Chloride: 105 mmol/L (ref 98–111)
Creatinine, Ser: 0.76 mg/dL (ref 0.61–1.24)
GFR, Estimated: >60 mL/min (ref >60)
Glucose, Bld: 101 mg/dL — ABNORMAL HIGH (ref 70–99)
Potassium: 3.9 mmol/L (ref 3.5–5.1)
Sodium: 140 mmol/L (ref 135–145)
Total Bilirubin: 1.2 mg/dL (ref 0.3–1.2)
Total Protein: 7.4 g/dL (ref 6.5–8.1)

## 2022-04-10 LAB — CBC WITH DIFFERENTIAL/PLATELET
Abs Immature Granulocytes: 0.02 10*3/uL (ref 0.00–0.07)
Basophils Absolute: 0 10*3/uL (ref 0.0–0.1)
Basophils Relative: 1 %
Eosinophils Absolute: 0.2 10*3/uL (ref 0.0–0.5)
Eosinophils Relative: 4 %
HCT: 50.4 % (ref 39.0–52.0)
Hemoglobin: 16.6 g/dL (ref 13.0–17.0)
Immature Granulocytes: 0 %
Lymphocytes Relative: 16 %
Lymphs Abs: 0.8 10*3/uL (ref 0.7–4.0)
MCH: 30.1 pg (ref 26.0–34.0)
MCHC: 32.9 g/dL (ref 30.0–36.0)
MCV: 91.5 fL (ref 80.0–100.0)
Monocytes Absolute: 0.3 10*3/uL (ref 0.1–1.0)
Monocytes Relative: 6 %
Neutro Abs: 3.7 10*3/uL (ref 1.7–7.7)
Neutrophils Relative %: 73 %
Platelets: 277 10*3/uL (ref 150–400)
RBC: 5.51 MIL/uL (ref 4.22–5.81)
RDW: 12.2 % (ref 11.5–15.5)
WBC: 5 10*3/uL (ref 4.0–10.5)
nRBC: 0 % (ref 0.0–0.2)

## 2022-04-10 LAB — BLOOD GAS, ARTERIAL
Acid-Base Excess: 0.1 mmol/L (ref 0.0–2.0)
Bicarbonate: 23.8 mmol/L (ref 20.0–28.0)
FIO2: 40 %
MECHVT: 500 mL
Mechanical Rate: 16
O2 Saturation: 99.6 %
PEEP: 5 cmH2O
Patient temperature: 37
pCO2 arterial: 35 mmHg (ref 32–48)
pH, Arterial: 7.44 (ref 7.35–7.45)
pO2, Arterial: 131 mmHg — ABNORMAL HIGH (ref 83–108)

## 2022-04-10 LAB — URINALYSIS, COMPLETE (UACMP) WITH MICROSCOPIC
Bacteria, UA: NONE SEEN
Bilirubin Urine: NEGATIVE
Glucose, UA: NEGATIVE mg/dL
Hgb urine dipstick: NEGATIVE
Ketones, ur: NEGATIVE mg/dL
Leukocytes,Ua: NEGATIVE
Nitrite: NEGATIVE
Protein, ur: NEGATIVE mg/dL
Specific Gravity, Urine: 1.01 (ref 1.005–1.030)
pH: 7 (ref 5.0–8.0)

## 2022-04-10 LAB — PROCALCITONIN: Procalcitonin: <0.1 ng/mL

## 2022-04-10 LAB — PROTIME-INR
INR: 1 (ref 0.8–1.2)
Prothrombin Time: 13.3 seconds (ref 11.4–15.2)

## 2022-04-10 LAB — SARS CORONAVIRUS 2 BY RT PCR: SARS Coronavirus 2 by RT PCR: NEGATIVE

## 2022-04-10 LAB — LACTIC ACID, PLASMA: Lactic Acid, Venous: 6.6 mmol/L (ref 0.5–1.9)

## 2022-04-10 LAB — APTT: aPTT: 29 seconds (ref 24–36)

## 2022-04-10 LAB — MAGNESIUM: Magnesium: 1.9 mg/dL (ref 1.7–2.4)

## 2022-04-10 MED ORDER — DIVALPROEX SODIUM 125 MG PO CSDR
750.0000 mg | DELAYED_RELEASE_CAPSULE | Freq: Three times a day (TID) | ORAL | Status: DC
  Administered 2022-04-11 (×2): 750 mg via ORAL
  Filled 2022-04-10 (×3): qty 6

## 2022-04-10 MED ORDER — LORAZEPAM 2 MG/ML IJ SOLN: 2.0000 mg | INTRAMUSCULAR | Status: DC | PRN

## 2022-04-10 MED ORDER — BACLOFEN 10 MG PO TABS
10.0000 mg | ORAL_TABLET | Freq: Three times a day (TID) | ORAL | Status: DC
  Administered 2022-04-11 – 2022-04-18 (×23): 10 mg
  Filled 2022-04-10 (×26): qty 1

## 2022-04-10 MED ORDER — LORAZEPAM 2 MG/ML IJ SOLN
2.0000 mg | Freq: Once | INTRAMUSCULAR | Status: AC
  Administered 2022-04-10: 2 mg via INTRAVENOUS

## 2022-04-10 MED ORDER — VANCOMYCIN HCL 1750 MG/350ML IV SOLN
1750.0000 mg | Freq: Once | INTRAVENOUS | Status: AC
  Administered 2022-04-10: 1750 mg via INTRAVENOUS
  Filled 2022-04-10: qty 350

## 2022-04-10 MED ORDER — VALPROATE SODIUM 100 MG/ML IV SOLN
2000.0000 mg | Freq: Once | INTRAVENOUS | Status: AC
  Administered 2022-04-10: 2000 mg via INTRAVENOUS
  Filled 2022-04-10: qty 20

## 2022-04-10 MED ORDER — ACETAMINOPHEN 325 MG RE SUPP
650.0000 mg | Freq: Once | RECTAL | Status: AC
  Administered 2022-04-10: 650 mg via RECTAL
  Filled 2022-04-10: qty 2

## 2022-04-10 MED ORDER — MIDAZOLAM-SODIUM CHLORIDE 100-0.9 MG/100ML-% IV SOLN: 0.5000 mg/h | INTRAVENOUS | Status: DC

## 2022-04-10 MED ORDER — ORAL CARE MOUTH RINSE
15.0000 mL | OROMUCOSAL | Status: DC
  Administered 2022-04-11 – 2022-04-13 (×33): 15 mL via OROMUCOSAL

## 2022-04-10 MED ORDER — ROCURONIUM BROMIDE 10 MG/ML (PF) SYRINGE
PREFILLED_SYRINGE | INTRAVENOUS | Status: AC
  Filled 2022-04-10: qty 10

## 2022-04-10 MED ORDER — HEPARIN SODIUM (PORCINE) 5000 UNIT/ML IJ SOLN
5000.0000 [IU] | Freq: Three times a day (TID) | INTRAMUSCULAR | Status: DC
Start: 2022-04-11 — End: 2022-04-21
  Administered 2022-04-11 – 2022-04-20 (×28): 5000 [IU] via SUBCUTANEOUS
  Filled 2022-04-10 (×28): qty 1

## 2022-04-10 MED ORDER — ORAL CARE MOUTH RINSE: 15.0000 mL | OROMUCOSAL | Status: DC | PRN

## 2022-04-10 MED ORDER — PANTOPRAZOLE 2 MG/ML SUSPENSION
40.0000 mg | Freq: Every day | ORAL | Status: DC
  Administered 2022-04-11 – 2022-04-18 (×8): 40 mg
  Filled 2022-04-10 (×8): qty 20

## 2022-04-10 MED ORDER — VALPROATE SODIUM 100 MG/ML IV SOLN
1000.0000 mg | Freq: Once | INTRAVENOUS | Status: DC
  Filled 2022-04-10: qty 10

## 2022-04-10 MED ORDER — LEVETIRACETAM 100 MG/ML PO SOLN
500.0000 mg | Freq: Two times a day (BID) | ORAL | Status: DC
  Administered 2022-04-11 (×3): 500 mg
  Filled 2022-04-10 (×3): qty 5

## 2022-04-10 MED ORDER — GABAPENTIN 250 MG/5ML PO SOLN
400.0000 mg | Freq: Three times a day (TID) | ORAL | Status: DC
  Administered 2022-04-11 (×2): 400 mg via ORAL
  Filled 2022-04-10 (×4): qty 8

## 2022-04-10 MED ORDER — ETOMIDATE 2 MG/ML IV SOLN
20.0000 mg | Freq: Once | INTRAVENOUS | Status: AC
  Administered 2022-04-10: 20 mg via INTRAVENOUS
  Filled 2022-04-10: qty 10

## 2022-04-10 MED ORDER — SODIUM CHLORIDE 0.9 % IV SOLN
2.0000 g | Freq: Once | INTRAVENOUS | Status: AC
  Administered 2022-04-10: 2 g via INTRAVENOUS

## 2022-04-10 MED ORDER — ROCURONIUM BROMIDE 10 MG/ML (PF) SYRINGE
100.0000 mg | PREFILLED_SYRINGE | Freq: Once | INTRAVENOUS | Status: AC
  Administered 2022-04-10: 100 mg via INTRAVENOUS

## 2022-04-10 MED ORDER — SODIUM CHLORIDE 0.9 % IV SOLN
3000.0000 mg | Freq: Once | INTRAVENOUS | Status: AC
  Administered 2022-04-10: 3000 mg via INTRAVENOUS
  Filled 2022-04-10: qty 30

## 2022-04-10 MED ORDER — POLYETHYLENE GLYCOL 3350 17 G PO PACK: 17.0000 g | PACK | Freq: Every day | ORAL | Status: DC | PRN

## 2022-04-10 MED ORDER — LORAZEPAM 2 MG/ML IJ SOLN
INTRAMUSCULAR | Status: AC
  Filled 2022-04-10: qty 1

## 2022-04-10 MED ORDER — LACOSAMIDE 50 MG PO TABS
100.0000 mg | ORAL_TABLET | Freq: Two times a day (BID) | ORAL | Status: DC
  Administered 2022-04-11: 100 mg via ORAL
  Filled 2022-04-10 (×2): qty 2

## 2022-04-10 MED ORDER — NOREPINEPHRINE 4 MG/250ML-% IV SOLN
0.0000 ug/min | INTRAVENOUS | Status: DC
  Administered 2022-04-10: 2 ug/min via INTRAVENOUS
  Filled 2022-04-10: qty 250

## 2022-04-10 MED ORDER — PANTOPRAZOLE SODIUM 40 MG PO TBEC: 40.0000 mg | DELAYED_RELEASE_TABLET | Freq: Every day | ORAL | Status: DC

## 2022-04-10 MED ORDER — CHLORHEXIDINE GLUCONATE CLOTH 2 % EX PADS
6.0000 | MEDICATED_PAD | Freq: Every day | CUTANEOUS | Status: DC
  Administered 2022-04-11 – 2022-04-17 (×6): 6 via TOPICAL

## 2022-04-10 MED ORDER — PROPOFOL 1000 MG/100ML IV EMUL
5.0000 ug/kg/min | INTRAVENOUS | Status: DC
  Filled 2022-04-10: qty 100

## 2022-04-10 MED ORDER — DOCUSATE SODIUM 50 MG/5ML PO LIQD: 100.0000 mg | Freq: Two times a day (BID) | ORAL | Status: DC | PRN

## 2022-04-10 MED ORDER — LACTATED RINGERS IV BOLUS
2000.0000 mL | Freq: Once | INTRAVENOUS | Status: AC
  Administered 2022-04-10: 2000 mL via INTRAVENOUS

## 2022-04-10 MED ORDER — MIDAZOLAM-SODIUM CHLORIDE 100-0.9 MG/100ML-% IV SOLN
INTRAVENOUS | Status: AC
  Administered 2022-04-10: 7.5 mg/h
  Filled 2022-04-10: qty 100

## 2022-04-10 NOTE — H&P (Signed)
NAME:  Alex [name], MRN:  [id number], DOB:  [birth year], LOS: 0 ADMISSION DATE:  (Not on file), CONSULTATION DATE:  04/10/2022  REFERRING MD:  Amada Jupiter, Neuro , CHIEF COMPLAINT: Seizure  History of Present Illness:  26 year old man with a history of TBI and seizures and prior status epilepticus, presented to [facility] ED after a prolonged seizure for about 45 minutes , received 7.5 mg of Versed.  He was noted to be snoring and postictal on arrival, but remains unresponsive 2 hours later on neuro consultation with the episode of left eye deviation.  Valproate level was subtherapeutic.  He was loaded with Keppra, intubated for airway protection and transferred for LTM EEG monitoring due to suspicion for subclinical seizures.  Pertinent  Medical History  TBI and left spasticity Seizure disorder , last episode of status in February, admitted to [facility]-discharged on 750 valproate twice daily and lacosamide 100 twice daily  Significant Hospital Events: Including procedures, antibiotic start and stop dates in addition to other pertinent events   Head CT 6/19 multifocal encephalomalacia in bilateral frontal and temporal lobes and left parietal lobe, chronic  Interim History / Subjective:  Arrived to ICU intubated, on Versed drip at 10 mg/h, on low-dose Levophed  Objective   There were no vitals taken for this visit.    Vent Mode: AC FiO2 (%):  [40 %] 40 % Set Rate:  [16 bmp] 16 bmp Vt Set:  [500 mL] 500 mL PEEP:  [5 cmH20] 5 cmH20  No intake or output data in the 24 hours ending 04/10/22 2246 There were no vitals filed for this visit.  Examination: General: Unresponsive, orally intubated young man HENT: Tracheostomy scar, eyes midline, pupils reactive to light, no JVD Lungs: No accessory muscle use, clear to auscultation Cardiovascular: S1-S2 tacky, sinus on monitor Abdomen: Soft, nontender, no hepatosplenomegaly Extremities: No deformity Neuro: Left upper and lower extremity spastic,  right flaccid  Labs show normal electrolytes, lactic 6.6, no leukocytosis, negative procalcitonin   Resolved Hospital Problem list     Assessment & Plan:  Suspicion for status epilepticus Known seizure disorder, subtherapeutic valproate levels  -Loaded with Keppra , continue Keppra -Increase Depakote to 750 twice daily -Continue lacosamide 100 twice daily -Continue gabapentin 400 mg 3 times daily -Plan for LTM EEG and titrate Versed drip based on findings  TBI -continue baclofen Resume Seroquel, dantrolene in a.m.  Acute respiratory failure -intubated for airway protection Vent settings reviewed and adjusted Chest x-ray independently reviewed shows rotated film, ET tube in position, cannot rule out left lower lobe atelectasis/pneumonia -follow a.m. chest x-ray hold off antibiotics for now    Best Practice (right click and "Reselect all SmartList Selections" daily)   Diet/type: NPO DVT prophylaxis: LMWH GI prophylaxis: N/A Lines: N/A Foley:  N/A Code Status:  full code Last date of multidisciplinary goals of care discussion [NA]  Labs   CBC: Recent Labs  Lab 04/10/22 1317  WBC 5.0  NEUTROABS 3.7  HGB 16.6  HCT 50.4  MCV 91.5  PLT 277    Basic Metabolic Panel: Recent Labs  Lab 04/10/22 1317  NA 140  K 3.9  CL 105  CO2 19*  GLUCOSE 101*  BUN 13  CREATININE 0.76  CALCIUM 9.1  MG 1.9   GFR: Estimated Creatinine Clearance: 139.9 mL/min (by C-G formula based on SCr of 0.76 mg/dL). Recent Labs  Lab 04/10/22 1317  PROCALCITON <0.10  WBC 5.0  LATICACIDVEN 6.6*    Liver Function Tests: Recent Labs  Lab 04/10/22 1317  AST 36  ALT 25  ALKPHOS 84  BILITOT 1.2  PROT 7.4  ALBUMIN 4.0   No results for input(s): "LIPASE", "AMYLASE" in the last 168 hours. No results for input(s): "AMMONIA" in the last 168 hours.  ABG    Component Value Date/Time   PHART 7.44 04/10/2022 1610   PCO2ART 35 04/10/2022 1610   PO2ART 131 (H) 04/10/2022 1610   HCO3  23.8 04/10/2022 1610   TCO2 28 05/31/2021 0404   ACIDBASEDEF 2.8 (H) 01/13/2021 0108   O2SAT 99.6 04/10/2022 1610     Coagulation Profile: Recent Labs  Lab 04/10/22 1317  INR 1.0    Cardiac Enzymes: No results for input(s): "CKTOTAL", "CKMB", "CKMBINDEX", "TROPONINI" in the last 168 hours.  HbA1C: Hgb A1c MFr Bld  Date/Time Value Ref Range Status  05/31/2021 04:18 PM 4.9 4.8 - 5.6 % Final    Comment:    (NOTE) Pre diabetes:          5.7%-6.4%  Diabetes:              >6.4%  Glycemic control for   <7.0% adults with diabetes     CBG: No results for input(s): "GLUCAP" in the last 168 hours.  Review of Systems:   Unable to obtain  Past Medical History:  He,  has a past medical history of Seizure disorder (HCC) and TBI (traumatic brain injury).   Surgical History:   Past Surgical History:  Procedure Laterality Date   GASTROSTOMY TUBE PLACEMENT       Social History:   reports that he has never smoked. He has never used smokeless tobacco.   Family History:  His family history is not on file.   Allergies No Known Allergies   Home Medications  Prior to Admission medications   Medication Sig Start Date End Date Taking? Authorizing Provider  acetaminophen (TYLENOL) 160 MG/5ML solution Place 20.3 mLs (650 mg total) into feeding tube every 6 (six) hours as needed for mild pain, moderate pain, headache or fever. Patient not taking: Reported on 09/12/2021 06/06/21   Rolly Salter, MD  baclofen (LIORESAL) 10 MG tablet Place 10 mg into feeding tube 3 (three) times daily.    [provider]  baclofen (OZOBAX) 1 mg/mL SOLN oral solution Place 10 mLs (10 mg total) into feeding tube 3 (three) times daily. 06/04/21   Rolly Salter, MD  cholecalciferol (VITAMIN D3) 25 MCG (1000 UNIT) tablet Take 750 Units by mouth in the morning and at bedtime. Patient not taking: Reported on 11/30/2021    [provider]  dantrolene (DANTRIUM) 25 MG capsule Take 1 capsule  (25 mg total) by mouth daily. Patient not taking: Reported on 11/30/2021 06/06/21   Rolly Salter, MD  divalproex (DEPAKOTE SPRINKLE) 125 MG capsule Take 750 mg by mouth 2 (two) times daily. Patient not taking: Reported on 04/10/2022    [provider]  gabapentin (NEURONTIN) 250 MG/5ML solution Place 8 mLs (400 mg total) into feeding tube every 8 (eight) hours. 06/04/21   Rolly Salter, MD  gabapentin (NEURONTIN) 250 MG/5ML solution Take 8 mLs by mouth 3 (three) times daily.    [provider]  lacosamide (VIMPAT) 10 MG/ML oral solution Take 100 mg by mouth 2 (two) times daily.    [provider]  levETIRAcetam (KEPPRA) 100 MG/ML solution Place 5 mLs (500 mg total) into feeding tube 2 (two) times daily. Patient not taking: Reported on 11/30/2021 09/13/21   Allena Katz,  Zachery Conch, MD  melatonin 3 MG TABS tablet Place 2 tablets (6 mg total) into feeding tube at bedtime. Patient not taking: Reported on 11/30/2021 06/04/21   Rolly Salter, MD  Nutritional Supplements (FEEDING SUPPLEMENT, OSMOLITE 1.5 CAL,) LIQD Place 237 mLs into feeding tube 5 (five) times daily. 06/04/21   Rolly Salter, MD  Nutritional Supplements (FEEDING SUPPLEMENT, PROSOURCE TF,) liquid Place 45 mLs into feeding tube 2 (two) times daily. 06/04/21   Rolly Salter, MD  nystatin (MYCOSTATIN/NYSTOP) powder Apply 1 application topically 2 (two) times daily.    [provider]  polyethylene glycol (MIRALAX / GLYCOLAX) 17 g packet Place 17 g into feeding tube daily. 06/04/21   Rolly Salter, MD  PRESCRIPTION MEDICATION 120 mLs 3 (three) times daily. Med Plus 2.0    [provider]  QUEtiapine (SEROQUEL) 100 MG tablet Take 100 mg by mouth at bedtime.    [provider]  QUEtiapine (SEROQUEL) 25 MG tablet Take 75 mg by mouth in the morning. Patient not taking: Reported on 04/10/2022    [provider]  QUEtiapine (SEROQUEL) 50 MG tablet Take 150 mg by mouth at bedtime. Patient not  taking: Reported on 04/10/2022    [provider]  Skin Protectants, Misc. (MINERIN CREME EX) Apply 1 application topically daily.    [provider]  triamcinolone ointment (KENALOG) 0.1 % Apply topically 2 (two) times daily. Patient not taking: Reported on 09/12/2021 06/03/21   Rolly Salter, MD  valproic acid (DEPAKENE) 250 MG/5ML solution Place 5 mLs into feeding tube 3 (three) times daily. 01/15/22   [provider]  Water For Irrigation, Sterile (FREE WATER) SOLN Place 200 mLs into feeding tube every 8 (eight) hours. 06/04/21   Rolly Salter, MD  zinc oxide 20 % ointment Apply 1 application topically as directed. Apply to left flank/left thigh topically every shift Patient not taking: Reported on 09/12/2021    [provider]     Critical care time: 38 m       Cyril Mourning MD. [facility]. [facility] Pager : [phone]  If no response to pager , please call [phone] until 7 pm After 7:00 pm call Elink  [phone]   04/10/2022

## 2022-04-10 NOTE — ED Notes (Signed)
[facility]  called  per  DR  Delton Prairie  MD

## 2022-04-10 NOTE — ED Notes (Signed)
RN called [facility] and talked to Emilio Math about pt having a seizure today. Per Inetta Fermo, " Pt was acting normal this morning talking to staff and hitting other like he normally does. One of the other residents noticed that pt was having a seizure and we gave him is ordered medication. 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 When the medication didn't help we called 911 which was about 30 min after we were told by a resident."

## 2022-04-10 NOTE — ED Notes (Signed)
CALLED  [facility]  STILL WAITING  ON  BED

## 2022-04-10 NOTE — ED Notes (Signed)
Pt needing needing intubation for airway security.

## 2022-04-10 NOTE — ED Notes (Signed)
Pt needing intubati

## 2022-04-10 NOTE — ED Notes (Signed)
Intubated at this time with a 7.5 ETT tube 24 at the teeth. Positive color change on the color metric device. Equal breath sounds.

## 2022-04-10 NOTE — ED Notes (Signed)
RN attempted to call pt mother to give update. No answer

## 2022-04-10 NOTE — ED Notes (Signed)
[facility] DSS/ APS called returned RN call.

## 2022-04-10 NOTE — ED Provider Notes (Signed)
-----------------------------------------   10:23 PM on 04/10/2022 -----------------------------------------  On reassessment, the patient is adequately sedated with stable vital signs.  He is stable for transfer at this time.  [facility] here to bring him to [facility].   Dionne Bucy, MD 04/10/22 2224

## 2022-04-10 NOTE — ED Provider Notes (Signed)
[facility] Provider Note    Event Date/Time   First MD Initiated Contact with Patient 04/10/22 1312     (approximate)   History   Seizures   HPI  Alex [name] is a 26 y.o. male who presents to the ED for evaluation of Seizures   I reviewed DC summary from [facility] admission in February.  Admitted for his seizures and status epilepticus.  He has a history of TBI and left-sided spasticity.  He was discharged with lacosamide 100 mg twice daily and 750 mg of valproic acid twice daily.  Patient presents to the ED from his local SNF after a prolonged witnessed generalized seizure.  40-45 minutes were reported generalized tonic-clonic activity aborted with 7.5 mg of Versed with EMS.  He presents snoring and postictal.  No reported changes to his regimen or recent illnesses  Physical Exam   Triage Vital Signs: ED Triage Vitals  Enc Vitals Group     BP 04/10/22 1305 (!) 87/59     Pulse Rate 04/10/22 1305 (!) 157     Resp 04/10/22 1305 (!) 22     Temp 04/10/22 1305 (!) 101.2 F (38.4 C)     Temp Source 04/10/22 1305 Rectal     SpO2 04/10/22 1305 90 %     Weight 04/10/22 1313 166 lb 10.7 oz (75.6 kg)     Height --      Head Circumference --      Peak Flow --      Pain Score 04/10/22 1313 Asleep     Pain Loc --      Pain Edu? --      Excl. in GC? --     Most recent vital signs: Vitals:   04/10/22 1515 04/10/22 1540  BP: (!) 94/50 129/79  Pulse: (!) 109 100  Resp: 16 15  Temp: 97.8 F (36.6 C) 97.7 F (36.5 C)  SpO2: 99% 100%    General: Postictal, snoring and asleep.  Eyes are midline without nystagmus.  Pupils are PERRL.  Left-sided contractures are present and apparently chronic.  No reported seizure activity.  Warm to the touch CV:  Good peripheral perfusion.  Tachycardic Resp:  Normal effort.  Sonorous respirations Abd:  No distention.  PEG tube scar without an actual tube.  Soft MSK:  No deformity noted.  Neuro:  Left-sided  contractures Other:    Just prior to intubation he was noted to have left-sided eye deviation without any tonic-clonic activity and so received 2 mg of IV Ativan.  ED Results / Procedures / Treatments   Labs (all labs ordered are listed, but only abnormal results are displayed) Labs Reviewed  LACTIC ACID, PLASMA - Abnormal; Notable for the following components:      Result Value   Lactic Acid, Venous 6.6 (*)    All other components within normal limits  COMPREHENSIVE METABOLIC PANEL - Abnormal; Notable for the following components:   CO2 19 (*)    Glucose, Bld 101 (*)    Anion gap 16 (*)    All other components within normal limits  URINALYSIS, COMPLETE (UACMP) WITH MICROSCOPIC - Abnormal; Notable for the following components:   Color, Urine YELLOW (*)    APPearance CLEAR (*)    All other components within normal limits  VALPROIC ACID LEVEL - Abnormal; Notable for the following components:   Valproic Acid Lvl 39 (*)    All other components within normal limits  SARS CORONAVIRUS 2 BY  RT PCR  CULTURE, BLOOD (ROUTINE X 2)  CULTURE, BLOOD (ROUTINE X 2)  URINE CULTURE  CBC WITH DIFFERENTIAL/PLATELET  PROTIME-INR  APTT  PROCALCITONIN  LACTIC ACID, PLASMA  LACOSAMIDE    EKG   RADIOLOGY CXR interpreted by me without evidence of acute cardiopulmonary pathology.   Official radiology report(s): DG Chest Port 1 View  Result Date: 04/10/2022 CLINICAL DATA:  Questionable sepsis. Evaluate for abnormality. Seizure. EXAM: PORTABLE CHEST 1 VIEW COMPARISON:  AP chest 11/30/2021; CT chest 09/11/2021 FINDINGS: Interval extubation and interval removal of enteric tube compared to remote 11/30/2021 radiographs. Cardiac silhouette and mediastinal contours are within normal limits. The lungs are clear. No pleural effusion or pneumothorax. No acute skeletal abnormality. There are overlying catheters and EKG leads. IMPRESSION: No active disease. Electronically Signed   By: Neita Garnet M.D.    On: 04/10/2022 13:51    PROCEDURES and INTERVENTIONS:  .1-3 Lead EKG Interpretation  Performed by: Delton Prairie, MD Authorized by: Delton Prairie, MD     Interpretation: abnormal     ECG rate:  120   ECG rate assessment: tachycardic     Rhythm: sinus tachycardia     Ectopy: none     Conduction: normal   Procedure Name: Intubation Date/Time: 04/10/2022 3:58 PM  Performed by: Delton Prairie, MDPre-anesthesia Checklist: Patient identified, Patient being monitored, Emergency Drugs available, Timeout performed and Suction available Oxygen Delivery Method: Non-rebreather mask Preoxygenation: Pre-oxygenation with 100% oxygen Induction Type: Rapid sequence Ventilation: Mask ventilation without difficulty Laryngoscope Size: Glidescope and 3 Tube size: 7.5 mm Number of attempts: 1 Airway Equipment and Method: Rigid stylet Placement Confirmation: ETT inserted through vocal cords under direct vision, CO2 detector and Breath sounds checked- equal and bilateral    .Critical Care  Performed by: Delton Prairie, MD Authorized by: Delton Prairie, MD   Critical care provider statement:    Critical care time (minutes):  75   Critical care time was exclusive of:  Separately billable procedures and treating other patients   Critical care was necessary to treat or prevent imminent or life-threatening deterioration of the following conditions:  CNS failure or compromise and respiratory failure   Critical care was time spent personally by me on the following activities:  Development of treatment plan with patient or surrogate, discussions with consultants, evaluation of patient's response to treatment, examination of patient, ordering and review of laboratory studies, ordering and review of radiographic studies, ordering and performing treatments and interventions, pulse oximetry, re-evaluation of patient's condition and review of old charts   Medications  vancomycin (VANCOREADY) IVPB 1750 mg/350 mL (1,750  mg Intravenous New Bag/Given 04/10/22 1428)  norepinephrine (LEVOPHED) 4mg  in (0.016 mg/mL) premix infusion (2 mcg/min Intravenous New Bag/Given 04/10/22 1531)  propofol (DIPRIVAN) 1000 MG/100ML infusion ( Intravenous Not Given 04/10/22 1541)  rocuronium bromide 100 MG/10ML SOSY (has no administration in time range)  LORazepam (ATIVAN) 2 MG/ML injection (0 mg  Hold 04/10/22 1541)  levETIRAcetam (KEPPRA) 3,000 mg in sodium chloride 0.9 % 250 mL IVPB (has no administration in time range)  lactated ringers bolus 2,000 mL (0 mLs Intravenous Stopped 04/10/22 1525)  acetaminophen (TYLENOL) suppository 650 mg (650 mg Rectal Given 04/10/22 1321)  valproate (DEPACON) 2,000 mg in dextrose 5 % 50 mL IVPB (0 mg Intravenous Stopped 04/10/22 1500)  ceFEPIme (MAXIPIME) 2 g in sodium chloride 0.9 % 100 mL IVPB (0 g Intravenous Stopped 04/10/22 1420)  rocuronium bromide 10 mg/mL (PF) syringe (100 mg Intravenous Given 04/10/22 1543)  etomidate (AMIDATE) injection  20 mg (20 mg Intravenous Given 04/10/22 1545)  midazolam-sodium chloride 100-0.9 MG/100ML-% infusion (  New Bag/Given 04/10/22 1543)  LORazepam (ATIVAN) injection 2 mg (2 mg Intravenous Given 04/10/22 1540)     IMPRESSION / MDM / ASSESSMENT AND PLAN / ED COURSE  I reviewed the triage vital signs and the nursing notes.  Differential diagnosis includes, but is not limited to, sepsis, status epilepticus, trauma, treating hemorrhage, stroke  {Patient presents with symptoms of an acute illness or injury that is potentially life-threatening.  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  He presents postictal without clear seizure activity, but after a couple hours and during neurology evaluation here he is noted to have some faint leftward eye deviation concerning for possible seizure.  He is initially improving somewhat from  a mental status perspective and maintaining his airway, but he never actually wakes up.  Due to this, and the need for transfer, elected to intubate the patient to facilitate safe transfer in the setting of status epilepticus.  We do not have continuous EEG capabilities at our facility.  I initially reached out to [facility] due to mother's preference, but they have no availability of beds.  Therefore reached out to [facility], ICU and they accepted.  Status epilepticus is noted likely due to his seizure.  Possibility of sepsis and septic shock is present.  He presented febrile, tachycardic and hypotensive.  Soft blood pressure at the time of intubation requiring a small dose of Levophed, 2 mics per minute.  Blood work is otherwise reassuring without leukocytosis.  Besides his metabolic acidosis, has a normal metabolic panel.  Valproic acid level is low and he is loaded with this.  Procalcitonin is low, urine is clear and CXR is clear.  Uncertain if he actually has infectious underlying pathology to precipitate his fever.    Clinical Course as of 04/10/22 1559  Mon Apr 10, 2022  1350 reassessed [DS]  1436 Reassessed.  Remains sleeping with sonorous respirations.  No further seizure activity has been here.  We will CT his head because he is not waking up. [DS]  1448 I speak with [facility] patient logistics and they will patient's out to a neurologist for possible transfer [DS]  1454 Dr. Lia Foyer, [facility] neuro.  [DS]  1516 Reassessed.  Still with poor mental status.  We will intubate. Updated nurse [DS]  1525 Kim , Dr. Selina Cooley.  [DS]  1527 200 lacosamide load [DS]  1535 Dr. Amada Jupiter at the bedside [DS]  1549 Intubated without difficulty [DS]  1553 Dr. Kendrick Fries. ICU. Accepted.  [DS]    Clinical Course User Index [DS] Delton Prairie, MD     FINAL CLINICAL IMPRESSION(S) / ED DIAGNOSES   Final diagnoses:  Status epilepticus (HCC)     Rx / DC Orders   ED Discharge Orders     None         Note:  This document was prepared using Dragon voice recognition software and may include unintentional dictation errors.   Delton Prairie, MD 04/10/22 [phone]

## 2022-04-10 NOTE — ED Notes (Signed)
RN reached out and left a voicemail for return of call to place a APS case.

## 2022-04-10 NOTE — ED Notes (Signed)
RN bladder scanned pt and found 358 ML of urine in bladder. Per MD, place foley cath.

## 2022-04-10 NOTE — ED Notes (Signed)
Called [facility] @ 2100 to get an update on status of bed assignment spoke to Tammy still no bed at this time

## 2022-04-10 NOTE — ED Notes (Signed)
RN provided oral care.

## 2022-04-10 NOTE — ED Notes (Signed)
Hand rails padded with seizure pads. RN placed safety mits on pt due to pulling at IV's and medical equipment.

## 2022-04-10 NOTE — Consult Note (Addendum)
Neurology Consultation Reason for Consult: Status epilepticus Referring Physician: Delton Prairie  CC: Status epileptiucs  History is obtained from: Patient  HPI: Alex [name] is a 26 y.o. male with a history of TBI and seizures, including status epilepticus who presents after a prolonged seizure at his facility. He reportedly seized for abotu 45 minutes prior to the seizure being aborted with versed at the facility. Since arrival a couple of hours ago, he has remained obtunded.   He was admitted for a similar episode in February, and transferred to [facility].    ROS:  Unable to obtain due to altered mental status.   Past Medical History:  Diagnosis Date   Seizure disorder [facility])    TBI (traumatic brain injury)      No family history on file.   Social History:  reports that he has never smoked. He has never used smokeless tobacco. No history on file for alcohol use and drug use.   Exam: Current vital signs: BP (!) 94/50   Pulse (!) 109   Temp 97.8 F (36.6 C)   Resp 16   Wt 75.6 kg   SpO2 99%   BMI 24.60 kg/m  Vital signs in last 24 hours: Temp:  [97.8 F (36.6 C)-101.2 F (38.4 C)] 97.8 F (36.6 C) (06/19 1515) Pulse Rate:  [105-157] 109 (06/19 1515) Resp:  [15-48] 16 (06/19 1515) BP: (86-98)/(45-64) 94/50 (06/19 1515) SpO2:  [90 %-100 %] 99 % (06/19 1515) Weight:  [75.6 kg] 75.6 kg (06/19 1313)   Physical Exam  Gen: in bed, not intubated  Neuro: Mental Status: Does not open eyes or follow commands, he does grimace to nox stim.  Cranial Nerves: II: no blink to threat. Pupils are equal, round, and reactive to light.   [name],IV, VI: Eyes were initially midline, but then he had an episode of left eye deviation with subtle left head turn that lasted for 30 seconds VII: Facial movement is symmetric.  Motor: He flexes to noxious stimulation x 4 on the right, he has increased ton eon the left at baseline.  Sensory: As above Cerebellar: Does not perform.       I have reviewed labs in epic and the results pertinent to this consultation are: VPA 39 UA negatvie Na 140 Ca 9.1  I have reviewed the images obtained:previous CT reviewed- extensive bitemporal encephalomalacea.   Impression: 26 yo M with status epilepticus. With the episode of eye deviation and persistent obtundation, I Am concerned that he may still be having partial subclinical seizures and he will need prolonged EEG. I will load with keppra for now, but long term, could likely go with increased depakote dose given that this happened in the setting of subtheraputic levels.   Recommendations: 1) Ativan 2mg  x 1 now 2) Continue gabapentin 400mg  TID 3) continue keppra if EEG reveals seizures 4) depacon 750mg  TID(increased from home 750mg  BID 5) continue home baclofen 10mg  TID 6) continue home lacosamide 100mg  BID 7) LTM EEG, will need transfer for this.   This patient is critically ill and at significant risk of neurological worsening, death and care requires constant monitoring of vital signs, hemodynamics,respiratory and cardiac monitoring, neurological assessment, discussion with family, other specialists and medical decision making of high complexity. I spent 35 minutes of neurocritical care time  in the care of  this patient. This was time spent independent of any time provided by nurse practitioner or PA.  , MD [facility] [phone]  If 7pm- 7am,  please page neurology on call as listed in AMION. 04/10/2022  4:08 PM

## 2022-04-10 NOTE — ED Triage Notes (Signed)
Per EMS they arrived to [facility] to pt having a seizure. Per EMS, Nursing staff said that pt had been having a seizure for prior to there arrival. Pt received 7.4mg  in total of versed. Pt is resting on ED stretcher postictal at this time.

## 2022-04-10 NOTE — ED Notes (Signed)
[facility]SF[facility]  CALLED PER  DR  Delton Prairie

## 2022-04-10 NOTE — H&P (Incomplete)
NAME:  CAMDAN BURDI, MRN:  [id number], DOB:  August 21, 1996, LOS: 0 ADMISSION DATE:  04/10/2022, CONSULTATION DATE:  04/10/22 REFERRING MD:  [facility] ED, CHIEF COMPLAINT:  seizure   History of Present Illness:  Alex [name] is a 26 y.o. M with PMH significant for TBI and seizure disorder who presented to [facility] ED with breakthrough seizure at his residential facility.  History taken from Epic as patient is intubated, per notes he had approximately 45 minute seizure before it was aborted with versed.  He had an admission at [facility] in Feb of this year for status as well.  Pt was post-ictal in the ED and required intubation    Pertinent  Medical History   has a past medical history of Seizure disorder (HCC) and TBI (traumatic brain injury).   Significant Hospital Events: Including procedures, antibiotic start and stop dates in addition to other pertinent events     Interim History / Subjective:  ***  Objective   There were no vitals taken for this visit.    Vent Mode: AC FiO2 (%):  [40 %] 40 % Set Rate:  [16 bmp] 16 bmp Vt Set:  [500 mL] 500 mL PEEP:  [5 cmH20] 5 cmH20  No intake or output data in the 24 hours ending 04/10/22 2317 There were no vitals filed for this visit.  Examination: General: *** HENT: *** Lungs: *** Cardiovascular: *** Abdomen: *** Extremities: *** Neuro: *** GU: ***  Resolved Hospital Problem list   ***  Assessment & Plan:  ***  Best Practice (right click and "Reselect all SmartList Selections" daily)   Diet/type: {diet type:25684} DVT prophylaxis: {anticoagulation (Optional):25687} GI prophylaxis: {AY:30160} Lines: {Central Venous Access:25771} Foley:  {Central Venous Access:25691} Code Status:  {Code Status:26939} Last date of multidisciplinary goals of care discussion [***]  Labs   CBC: Recent Labs  Lab 04/10/22 1317  WBC 5.0  NEUTROABS 3.7  HGB 16.6  HCT 50.4  MCV 91.5  PLT 277    Basic Metabolic Panel: Recent Labs  Lab  04/10/22 1317  NA 140  K 3.9  CL 105  CO2 19*  GLUCOSE 101*  BUN 13  CREATININE 0.76  CALCIUM 9.1  MG 1.9   GFR: Estimated Creatinine Clearance: 139.9 mL/min (by C-G formula based on SCr of 0.76 mg/dL). Recent Labs  Lab 04/10/22 1317  PROCALCITON <0.10  WBC 5.0  LATICACIDVEN 6.6*    Liver Function Tests: Recent Labs  Lab 04/10/22 1317  AST 36  ALT 25  ALKPHOS 84  BILITOT 1.2  PROT 7.4  ALBUMIN 4.0   No results for input(s): "LIPASE", "AMYLASE" in the last 168 hours. No results for input(s): "AMMONIA" in the last 168 hours.  ABG    Component Value Date/Time   PHART 7.44 04/10/2022 1610   PCO2ART 35 04/10/2022 1610   PO2ART 131 (H) 04/10/2022 1610   HCO3 23.8 04/10/2022 1610   TCO2 28 05/31/2021 0404   ACIDBASEDEF 2.8 (H) 01/13/2021 0108   O2SAT 99.6 04/10/2022 1610     Coagulation Profile: Recent Labs  Lab 04/10/22 1317  INR 1.0    Cardiac Enzymes: No results for input(s): "CKTOTAL", "CKMB", "CKMBINDEX", "TROPONINI" in the last 168 hours.  HbA1C: Hgb A1c MFr Bld  Date/Time Value Ref Range Status  05/31/2021 04:18 PM 4.9 4.8 - 5.6 % Final    Comment:    (NOTE) Pre diabetes:          5.7%-6.4%  Diabetes:              >  6.4%  Glycemic control for   <7.0% adults with diabetes     CBG: No results for input(s): "GLUCAP" in the last 168 hours.  Review of Systems:   ***  Past Medical History:  He,  has a past medical history of Seizure disorder (HCC) and TBI (traumatic brain injury).   Surgical History:   Past Surgical History:  Procedure Laterality Date   GASTROSTOMY TUBE PLACEMENT       Social History:   reports that he has never smoked. He has never used smokeless tobacco.   Family History:  His family history is not on file.   Allergies No Known Allergies   Home Medications  Prior to Admission medications   Medication Sig Start Date End Date Taking? Authorizing Provider  acetaminophen (TYLENOL) 160 MG/5ML solution Place  20.3 mLs (650 mg total) into feeding tube every 6 (six) hours as needed for mild pain, moderate pain, headache or fever. Patient not taking: Reported on 09/12/2021 06/06/21   Rolly Salter, MD  baclofen (LIORESAL) 10 MG tablet Place 10 mg into feeding tube 3 (three) times daily.    [provider]  baclofen (OZOBAX) 1 mg/mL SOLN oral solution Place 10 mLs (10 mg total) into feeding tube 3 (three) times daily. 06/04/21   Rolly Salter, MD  cholecalciferol (VITAMIN D3) 25 MCG (1000 UNIT) tablet Take 750 Units by mouth in the morning and at bedtime. Patient not taking: Reported on 11/30/2021    [provider]  dantrolene (DANTRIUM) 25 MG capsule Take 1 capsule (25 mg total) by mouth daily. Patient not taking: Reported on 11/30/2021 06/06/21   Rolly Salter, MD  divalproex (DEPAKOTE SPRINKLE) 125 MG capsule Take 750 mg by mouth 2 (two) times daily. Patient not taking: Reported on 04/10/2022    [provider]  gabapentin (NEURONTIN) 250 MG/5ML solution Place 8 mLs (400 mg total) into feeding tube every 8 (eight) hours. 06/04/21   Rolly Salter, MD  gabapentin (NEURONTIN) 250 MG/5ML solution Take 8 mLs by mouth 3 (three) times daily.    [provider]  lacosamide (VIMPAT) 10 MG/ML oral solution Take 100 mg by mouth 2 (two) times daily.    [provider]  levETIRAcetam (KEPPRA) 100 MG/ML solution Place 5 mLs (500 mg total) into feeding tube 2 (two) times daily. Patient not taking: Reported on 11/30/2021 09/13/21   Rolly Salter, MD  melatonin 3 MG TABS tablet Place 2 tablets (6 mg total) into feeding tube at bedtime. Patient not taking: Reported on 11/30/2021 06/04/21   Rolly Salter, MD  Nutritional Supplements (FEEDING SUPPLEMENT, OSMOLITE 1.5 CAL,) LIQD Place 237 mLs into feeding tube 5 (five) times daily. 06/04/21   Rolly Salter, MD  Nutritional Supplements (FEEDING SUPPLEMENT, PROSOURCE TF,) liquid Place 45 mLs into feeding tube 2 (two) times daily.  06/04/21   Rolly Salter, MD  nystatin (MYCOSTATIN/NYSTOP) powder Apply 1 application topically 2 (two) times daily.    [provider]  polyethylene glycol (MIRALAX / GLYCOLAX) 17 g packet Place 17 g into feeding tube daily. 06/04/21   Rolly Salter, MD  PRESCRIPTION MEDICATION 120 mLs 3 (three) times daily. Med Plus 2.0    [provider]  QUEtiapine (SEROQUEL) 100 MG tablet Take 100 mg by mouth at bedtime.    [provider]  QUEtiapine (SEROQUEL) 25 MG tablet Take 75 mg by mouth in the morning. Patient not taking: Reported on 04/10/2022    [provider]  QUEtiapine (SEROQUEL) 50 MG tablet Take 150 mg by mouth at bedtime. Patient not taking: Reported on 04/10/2022    [provider]  Skin Protectants, Misc. (MINERIN CREME EX) Apply 1 application topically daily.    [provider]  triamcinolone ointment (KENALOG) 0.1 % Apply topically 2 (two) times daily. Patient not taking: Reported on 09/12/2021 06/03/21   Rolly Salter, MD  valproic acid (DEPAKENE) 250 MG/5ML solution Place 5 mLs into feeding tube 3 (three) times daily. 01/15/22   [provider]  Water For Irrigation, Sterile (FREE WATER) SOLN Place 200 mLs into feeding tube every 8 (eight) hours. 06/04/21   Rolly Salter, MD  zinc oxide 20 % ointment Apply 1 application topically as directed. Apply to left flank/left thigh topically every shift Patient not taking: Reported on 09/12/2021    [provider]     Critical care time: ***

## 2022-04-11 ENCOUNTER — Inpatient Hospital Stay (HOSPITAL_COMMUNITY): Payer: Medicaid Other

## 2022-04-11 DIAGNOSIS — J9600 Acute respiratory failure, unspecified whether with hypoxia or hypercapnia: Secondary | ICD-10-CM | POA: Diagnosis not present

## 2022-04-11 DIAGNOSIS — G40901 Epilepsy, unspecified, not intractable, with status epilepticus: Secondary | ICD-10-CM | POA: Diagnosis not present

## 2022-04-11 DIAGNOSIS — R569 Unspecified convulsions: Secondary | ICD-10-CM

## 2022-04-11 LAB — MAGNESIUM
Magnesium: 1.8 mg/dL (ref 1.7–2.4)
Magnesium: 2.2 mg/dL (ref 1.7–2.4)
Magnesium: 2.5 mg/dL — ABNORMAL HIGH (ref 1.7–2.4)

## 2022-04-11 LAB — BASIC METABOLIC PANEL
Anion gap: 12 (ref 5–15)
BUN: 8 mg/dL (ref 6–20)
CO2: 22 mmol/L (ref 22–32)
Calcium: 8.6 mg/dL — ABNORMAL LOW (ref 8.9–10.3)
Chloride: 103 mmol/L (ref 98–111)
Creatinine, Ser: 0.68 mg/dL (ref 0.61–1.24)
GFR, Estimated: >60 mL/min (ref >60)
Glucose, Bld: 98 mg/dL (ref 70–99)
Potassium: 4.1 mmol/L (ref 3.5–5.1)
Sodium: 137 mmol/L (ref 135–145)

## 2022-04-11 LAB — PHOSPHORUS
Phosphorus: 3.4 mg/dL (ref 2.5–4.6)
Phosphorus: 3.1 mg/dL (ref 2.5–4.6)
Phosphorus: 2.5 mg/dL (ref 2.5–4.6)

## 2022-04-11 LAB — CBC
HCT: 48.4 % (ref 39.0–52.0)
Hemoglobin: 16.3 g/dL (ref 13.0–17.0)
MCH: 30.5 pg (ref 26.0–34.0)
MCHC: 33.7 g/dL (ref 30.0–36.0)
MCV: 90.5 fL (ref 80.0–100.0)
Platelets: 219 10*3/uL (ref 150–400)
RBC: 5.35 MIL/uL (ref 4.22–5.81)
RDW: 12.6 % (ref 11.5–15.5)
WBC: 9.7 10*3/uL (ref 4.0–10.5)
nRBC: 0 % (ref 0.0–0.2)

## 2022-04-11 LAB — URINE CULTURE: Culture: NO GROWTH

## 2022-04-11 LAB — MRSA NEXT GEN BY PCR, NASAL: MRSA by PCR Next Gen: DETECTED — AB

## 2022-04-11 LAB — HEMOGLOBIN A1C
Hgb A1c MFr Bld: 5.1 % (ref 4.8–5.6)
Mean Plasma Glucose: 99.67 mg/dL

## 2022-04-11 LAB — GLUCOSE, CAPILLARY
Glucose-Capillary: 74 mg/dL (ref 70–99)
Glucose-Capillary: 143 mg/dL — ABNORMAL HIGH (ref 70–99)
Glucose-Capillary: 194 mg/dL — ABNORMAL HIGH (ref 70–99)

## 2022-04-11 MED ORDER — VALPROIC ACID 250 MG/5ML PO SOLN: 750.0000 mg | Freq: Three times a day (TID) | ORAL | Status: DC

## 2022-04-11 MED ORDER — MUPIROCIN 2 % EX OINT
1.0000 | TOPICAL_OINTMENT | Freq: Two times a day (BID) | CUTANEOUS | Status: AC
  Administered 2022-04-11 – 2022-04-15 (×10): 1 via NASAL
  Filled 2022-04-11: qty 22

## 2022-04-11 MED ORDER — MIDAZOLAM-SODIUM CHLORIDE 100-0.9 MG/100ML-% IV SOLN
INTRAVENOUS | Status: AC
  Filled 2022-04-11: qty 100

## 2022-04-11 MED ORDER — LACOSAMIDE 50 MG PO TABS
100.0000 mg | ORAL_TABLET | Freq: Two times a day (BID) | ORAL | Status: DC
  Administered 2022-04-11 – 2022-04-18 (×15): 100 mg
  Filled 2022-04-11 (×15): qty 2

## 2022-04-11 MED ORDER — MIDAZOLAM-SODIUM CHLORIDE 100-0.9 MG/100ML-% IV SOLN
10.0000 mg/h | INTRAVENOUS | Status: DC
  Administered 2022-04-11: 10 mg/h via INTRAVENOUS
  Administered 2022-04-11: 9 mg/h via INTRAVENOUS
  Filled 2022-04-11: qty 100

## 2022-04-11 MED ORDER — OSMOLITE 1.5 CAL PO LIQD
1000.0000 mL | ORAL | Status: DC
  Administered 2022-04-11: 1000 mL

## 2022-04-11 MED ORDER — MAGNESIUM SULFATE 2 GM/50ML IV SOLN
2.0000 g | Freq: Once | INTRAVENOUS | Status: AC
  Administered 2022-04-11: 2 g via INTRAVENOUS
  Filled 2022-04-11: qty 50

## 2022-04-11 MED ORDER — ACETAMINOPHEN 325 MG PO TABS
650.0000 mg | ORAL_TABLET | Freq: Four times a day (QID) | ORAL | Status: DC | PRN
Start: 2022-04-11 — End: 2022-04-18
  Administered 2022-04-11 – 2022-04-16 (×6): 650 mg via NASOGASTRIC
  Filled 2022-04-11 (×7): qty 2

## 2022-04-11 MED ORDER — OSMOLITE 1.5 CAL PO LIQD
1000.0000 mL | ORAL | Status: DC
  Administered 2022-04-11 – 2022-04-12 (×2): 1000 mL

## 2022-04-11 MED ORDER — VITAL HIGH PROTEIN PO LIQD: 1000.0000 mL | ORAL | Status: DC

## 2022-04-11 MED ORDER — SODIUM CHLORIDE 0.9 % IV SOLN: INTRAVENOUS | Status: DC

## 2022-04-11 MED ORDER — GABAPENTIN 250 MG/5ML PO SOLN
400.0000 mg | Freq: Three times a day (TID) | ORAL | Status: DC
  Administered 2022-04-11 – 2022-04-18 (×21): 400 mg
  Filled 2022-04-11 (×25): qty 8

## 2022-04-11 MED ORDER — VALPROIC ACID 250 MG/5ML PO SOLN
1000.0000 mg | Freq: Two times a day (BID) | ORAL | Status: DC
  Administered 2022-04-11 – 2022-04-18 (×14): 1000 mg
  Filled 2022-04-11 (×15): qty 20

## 2022-04-11 MED ORDER — INSULIN ASPART 100 UNIT/ML IJ SOLN
0.0000 [IU] | INTRAMUSCULAR | Status: DC
  Administered 2022-04-11: 2 [IU] via SUBCUTANEOUS
  Administered 2022-04-13 – 2022-04-15 (×6): 1 [IU] via SUBCUTANEOUS

## 2022-04-11 MED ORDER — MIDAZOLAM BOLUS VIA INFUSION
4.0000 mg | INTRAVENOUS | Status: DC | PRN
  Administered 2022-04-12 (×2): 4 mg via INTRAVENOUS

## 2022-04-11 MED ORDER — PROSOURCE TF PO LIQD
45.0000 mL | Freq: Two times a day (BID) | ORAL | Status: DC
  Administered 2022-04-11 – 2022-04-14 (×7): 45 mL
  Filled 2022-04-11 (×7): qty 45

## 2022-04-11 MED ORDER — FENTANYL CITRATE PF 50 MCG/ML IJ SOSY
50.0000 ug | PREFILLED_SYRINGE | INTRAMUSCULAR | Status: DC | PRN
  Administered 2022-04-11 – 2022-04-14 (×9): 50 ug via INTRAVENOUS
  Filled 2022-04-11 (×9): qty 1

## 2022-04-11 NOTE — Progress Notes (Signed)
NAME:  Alex [name], MRN:  [id number], DOB:  December 19, 1995, LOS: 1 ADMISSION DATE:  04/10/2022, CONSULTATION DATE:  04/11/2022  REFERRING MD:  Amada Jupiter, Neuro , CHIEF COMPLAINT: Seizure  History of Present Illness:  26 year old man with a history of TBI and seizures and prior status epilepticus, presented to [facility] ED after a prolonged seizure for about 45 minutes , received 7.5 mg of Versed.  He was noted to be snoring and postictal on arrival, but remains unresponsive 2 hours later on neuro consultation with the episode of left eye deviation.  Valproate level was subtherapeutic.  He was loaded with Keppra, intubated for airway protection and transferred for LTM EEG monitoring due to suspicion for subclinical seizures.  Pertinent  Medical History  TBI and left spasticity Seizure disorder , last episode of status in February, admitted to [facility]-discharged on 750 valproate twice daily and lacosamide 100 twice daily  Significant Hospital Events: Including procedures, antibiotic start and stop dates in addition to other pertinent events   Head CT 6/19 multifocal encephalomalacia in bilateral frontal and temporal lobes and left parietal lobe, chronic EEG shows diffuse slowing with no epileptiform activity.  Interim History / Subjective:  Arrived to ICU intubated, on Versed drip at 10 mg/h, on low-dose Levophed  Objective   Blood pressure 118/84, pulse (!) 120, temperature 98.5 F (36.9 C), temperature source Axillary, resp. rate 16, weight 79.1 kg, SpO2 98 %.    Vent Mode: PRVC FiO2 (%):  [40 %] 40 % Set Rate:  [16 bmp] 16 bmp Vt Set:  [500 mL-560 mL] 560 mL PEEP:  [5 cmH20] 5 cmH20 Plateau Pressure:  [18 cmH20] 18 cmH20   Intake/Output Summary (Last 24 hours) at 04/11/2022 0940 Last data filed at 04/11/2022 0800 Gross per 24 hour  Intake 50 ml  Output 255 ml  Net -205 ml   Filed Weights   04/11/22 0500  Weight: 79.1 kg    Examination: General: Unresponsive, orally intubated  young man HENT: Tracheostomy scar, eyes midline, pupils reactive to light, no JVD Lungs: No accessory muscle use, clear to auscultation Cardiovascular: S1-S2 tacky, sinus on monitor Abdomen: Soft, nontender, no hepatosplenomegaly Extremities: No deformity Neuro: Left upper and lower extremity spastic, right flaccid  Labs show normal electrolytes, lactic 6.6, no leukocytosis, negative procalcitonin   Ancillary tests personally reviewed:   cEEG personally reviewed and shows diffuse slowing only.   Assessment & Plan:  Suspicion for status epilepticus Known seizure disorder, subtherapeutic valproate levels  - Wean versed to off decreasing by 1mg /h. -Loaded with Keppra , continue Keppra -Increase Depakote to 750 twice daily -Continue lacosamide 100 twice daily -Continue gabapentin 400 mg 3 times daily  TBI -continue baclofen Resume Seroquel, dantrolene in a.m.  Acute respiratory failure -intubated for airway protection Vent settings reviewed and adjusted Chest x-ray independently reviewed shows rotated film, ET tube in position, cannot rule out left lower lobe atelectasis/pneumonia -follow a.m. chest x-ray hold off antibiotics for now   Best Practice (right click and "Reselect all SmartList Selections" daily)   Diet/type: NPO start tube feeds  DVT prophylaxis: LMWH GI prophylaxis: N/A Lines: N/A Foley:  N/A Code Status:  full code Last date of multidisciplinary goals of care discussion [NA]  CRITICAL CARE Performed by:   Total critical care time: 34 minutes  Critical care time was exclusive of separately billable procedures and treating other patients.  Critical care was necessary to treat or prevent imminent or life-threatening deterioration.  Critical care was time  spent personally by me on the following activities: development of treatment plan with patient and/or surrogate as well as nursing, discussions with consultants, evaluation of patient's  response to treatment, examination of patient, obtaining history from patient or surrogate, ordering and performing treatments and interventions, ordering and review of laboratory studies, ordering and review of radiographic studies, pulse oximetry, re-evaluation of patient's condition and participation in multidisciplinary rounds.  Lynnell Catalan, MD [facility] ICU Physician [facility]  Pager: [phone] Mobile: [phone] After hours: [phone].   04/11/2022

## 2022-04-11 NOTE — Progress Notes (Signed)
Initial Nutrition Assessment  DOCUMENTATION CODES:   Not applicable  INTERVENTION:   Initiate tube feeds via NG tube: - Osmolite 1.5 @ 60 ml/hr (1440 ml/day) - ProSource TF 45 ml BID  Tube feeding regimen provides 2160 kcal, 112 grams of protein, and 1097 ml of H2O.   NUTRITION DIAGNOSIS:   Inadequate oral intake related to inability to eat as evidenced by NPO status.  GOAL:   Patient will meet greater than or equal to 90% of their needs  MONITOR:   Vent status, Labs, Weight trends, TF tolerance, I & O's  REASON FOR ASSESSMENT:   Ventilator, Consult Enteral/tube feeding initiation and management  ASSESSMENT:   26 year old male who presented to the ED on 6/19 with seizures. Pt required intubation in the ED. PMH of TBI and seizures.  Consult received for enteral nutrition initiation and management. Pt with NG tube in stomach per abdominal x-ray today.  Unable to obtain diet and weight history at this time. Per RN notes from previous admissions in 2022, pt previously with PEG tube which was being used for bolus feeds and has since been removed. Reviewed weight history in chart. Weight trending up over the last 10 months. Noted pt weighed 47.6 kg on 05/23/21 and now weighs 79.1 kg.  Patient is currently intubated on ventilator support MV: 8.5 L/min Temp (24hrs), Avg:97.7 F (36.5 C), Min:96.9 F (36.1 C), Max:101.2 F (38.4 C)  Drips: Versed NS: 75 ml/hr  Medications reviewed and include: protonix, IV magnesium sulfate 2 grams once  Labs reviewed: lactic acid 6.6 on 6/19 CBG's: 74  NUTRITION - FOCUSED PHYSICAL EXAM:  Flowsheet Row Most Recent Value  Orbital Region No depletion  Upper Arm Region No depletion  Thoracic and Lumbar Region No depletion  Buccal Region Unable to assess  Temple Region No depletion  Clavicle Bone Region No depletion  Clavicle and Acromion Bone Region Mild depletion  Scapular Bone Region No depletion  Dorsal Hand Mild depletion   Patellar Region Moderate depletion  Anterior Thigh Region Moderate depletion  Posterior Calf Region Moderate depletion  Edema (RD Assessment) None  Hair Reviewed  Eyes Reviewed  Mouth Reviewed  Skin Reviewed  Nails Reviewed       Diet Order:   Diet Order     None       EDUCATION NEEDS:   No education needs have been identified at this time  Skin:  Skin Assessment: Reviewed RN Assessment (irritant dermatitis to coccyx)  Last BM:  no documented BM  Height:   Ht Readings from Last 1 Encounters:  04/10/22 5\' 9"  (1.753 m)    Weight:   Wt Readings from Last 1 Encounters:  04/11/22 79.1 kg    BMI:  Body mass index is 25.75 kg/m.  Estimated Nutritional Needs:   Kcal:  2100-2300  Protein:  105-120 grams  Fluid:  >2.0 L    04/13/22, MS, RD, LDN Inpatient Clinical Dietitian Please see AMiON for contact information.

## 2022-04-11 NOTE — Progress Notes (Signed)
EEG LTM hook up. [facility] monitoring. Test button was tested.

## 2022-04-11 NOTE — Progress Notes (Signed)
eLink Physician-Brief Progress Note Patient Name: Alex [name] DOB: [birth year] MRN: [id number]   Date of Service  04/11/2022  HPI/Events of Note  26 yr old male with hx of TBI and SZ disorder presented with status epilepticus.  Evaluated by neurology.    eICU Interventions  Chart reviewed     Intervention Category Evaluation Type: New Patient Evaluation  Henry Russel, P 04/11/2022, 12:49 AM

## 2022-04-11 NOTE — Progress Notes (Signed)
Plan to wean off of versed 1mg  an hour per Dr . Here is what the schedule will look like:   1000: 9mg  1100: 8mg  1200: 7 mg 1300: 6 mg 1400: 5 mg 1500: 4 mg 1600: 3 mg 1700: 2 mg 1800: 1 mg 1900: off  Denese Killings RN

## 2022-04-11 NOTE — Progress Notes (Signed)
Subjective: No seizures overnight.  ROS: Unable to obtain due to poor mental status  Examination  Vital signs in last 24 hours: Temp:  [96.9 F (36.1 C)-101.2 F (38.4 C)] 98.5 F (36.9 C) (06/20 0800) Pulse Rate:  [76-157] 120 (06/20 0800) Resp:  [15-48] 16 (06/20 0800) BP: (86-150)/(45-100) 118/84 (06/20 0800) SpO2:  [90 %-100 %] 98 % (06/20 0800) FiO2 (%):  [40 %] 40 % (06/20 0822) Weight:  [75.6 kg-79.1 kg] 79.1 kg (06/20 0500)  General: lying in bed, NAD Neuro: on versed @8ml /hr, does not open eyes to noxious stimuli, does not follow commands, PERRLA, no gaze deviation, corneal intact, subtle withdrawal to noxious stimuli in all extremities with left upper and lower extremity spasticity  Basic Metabolic Panel: Recent Labs  Lab 04/10/22 1317 04/11/22 0610  NA 140 137  K 3.9 4.1  CL 105 103  CO2 19* 22  GLUCOSE 101* 98  BUN 13 8  CREATININE 0.76 0.68  CALCIUM 9.1 8.6*  MG 1.9 1.8  PHOS  --  3.4    CBC: Recent Labs  Lab 04/10/22 1317 04/11/22 0610  WBC 5.0 9.7  NEUTROABS 3.7  --   HGB 16.6 16.3  HCT 50.4 48.4  MCV 91.5 90.5  PLT 277 219     Coagulation Studies: Recent Labs    04/10/22 1317  LABPROT 13.3  INR 1.0    Imaging CT head without contrast 04/10/2022: No acute intracranial pathology. Stable multifocal encephalomalacia likely reflecting sequela of prior trauma.  ASSESSMENT AND PLAN: 26 year old [name] with history of epilepsy who presented with focal convulsive status epilepticus which has since resolved.  Epilepsy with status epilepticus, resolved -No evidence of infection, Depakote level was low at 39  Recommendations -Okay to stop Versed -Continue video EEG overnight to look for seizure recurrence -Will increase Depakote to 1000 mg twice daily -Was started on Keppra 500 mg twice daily on arrival.  We will continue for now.  However, will consider stopping tomorrow if patient remains seizure-free as we have already increased Depakote  to minimize polypharmacy -Of note, patient appears to be medically refractory at this point and will likely benefit from presurgical evaluation.  Will defer further management to primary epileptologist -Continue seizure precautions -As needed IV Ativan 2 mg for clinical seizure-like activity -Of note, at time of discharge, recommend prescribing rescue medication (intranasal Versed or Valtoco) -Management of rest of comorbidities per primary team -Plan discussed with Dr. 30  CRITICAL CARE Performed by: Alena Bills   Total critical care time: 36 minutes  Critical care time was exclusive of separately billable procedures and treating other patients.  Critical care was necessary to treat or prevent imminent or life-threatening deterioration.  Critical care was time spent personally by me on the following activities: development of treatment plan with patient and/or surrogate as well as nursing, discussions with consultants, evaluation of patient's response to treatment, examination of patient, obtaining history from patient or surrogate, ordering and performing treatments and interventions, ordering and review of laboratory studies, ordering and review of radiographic studies, pulse oximetry and re-evaluation of patient's condition.     Charlsie Quest Epilepsy [facility] For questions after 5pm please refer to AMION to reach the Neurologist on call

## 2022-04-11 NOTE — Progress Notes (Signed)
Spilled approx 20 cc versed gtt on the floor. Witnessed by Beryl Meager, RN.   Sherral Hammers RN

## 2022-04-11 NOTE — Progress Notes (Signed)
This RN noticed patient's NG tube was >65 cm external. Normally 50 cm external.  Advanced back to 50 cm external. Asked Dr Denese Killings for Abd XRAY before giving per tube meds.  Sherral Hammers RN
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
## 2022-04-11 NOTE — Progress Notes (Signed)
Patient HR sustained 130-135 when dropped down to 5 versed. No obvious signs of seizures. Dr Denese Killings notified and now have order for 50 mcg fentanyl PRN.  After giving 50 mcg fentanyl, patient HR down to 125-127. Will continue to monitor.   Sherral Hammers RN

## 2022-04-11 NOTE — Procedures (Signed)
Patient Name: Alex [name]  MRN: [id number]  Epilepsy Attending: Charlsie Quest  Referring Physician/Provider: Gleason, Darcella Gasman, PA-C Duration: 04/11/2022 0215 to 04/12/2022 0215  Patient history: 26 year old male with history of epilepsy presented with breakthrough seizures.  EEG to evaluate for seizure.  Level of alertness:  comatose  AEDs during EEG study: LEV, VPA, LCM, GBP, versed  Technical aspects: This EEG study was done with scalp electrodes positioned according to the 10-20 International system of electrode placement. Electrical activity was acquired at a sampling rate of 500Hz  and reviewed with a high frequency filter of 70Hz  and a low frequency filter of 1Hz . EEG data were recorded continuously and digitally stored.   Description: EEG showed continuous generalized and lateralized left hemisphere low amplitude 2 to 3 Hz delta slowing.  There is also 13 to 15 Hz beta activity in right frontal region. Hyperventilation and photic stimulation were not performed.     ABNORMALITY -Continuous slow, generalized and lateralized left hemisphere  IMPRESSION: This study is suggestive of cortical dysfunction in left hemisphere likely secondary to underlying structural abnormality.  Additionally there is severe diffuse encephalopathy, most likely related to sedation.  No seizures or epileptiform discharges were seen throughout the recording.  Shariff Lasky 

## 2022-04-11 NOTE — Progress Notes (Signed)
Per LG to MB, She maintenance Pt electrodes. Reported No skin breakdown.  

## 2022-04-12 DIAGNOSIS — J9601 Acute respiratory failure with hypoxia: Secondary | ICD-10-CM

## 2022-04-12 DIAGNOSIS — G40901 Epilepsy, unspecified, not intractable, with status epilepticus: Secondary | ICD-10-CM | POA: Diagnosis present

## 2022-04-12 DIAGNOSIS — R569 Unspecified convulsions: Secondary | ICD-10-CM | POA: Diagnosis not present

## 2022-04-12 HISTORY — DX: Epilepsy, unspecified, not intractable, with status epilepticus: G40.901

## 2022-04-12 LAB — PHOSPHORUS
Phosphorus: 2.6 mg/dL (ref 2.5–4.6)
Phosphorus: 2.2 mg/dL — ABNORMAL LOW (ref 2.5–4.6)

## 2022-04-12 LAB — GLUCOSE, CAPILLARY
Glucose-Capillary: 101 mg/dL — ABNORMAL HIGH (ref 70–99)
Glucose-Capillary: 107 mg/dL — ABNORMAL HIGH (ref 70–99)
Glucose-Capillary: 110 mg/dL — ABNORMAL HIGH (ref 70–99)
Glucose-Capillary: 112 mg/dL — ABNORMAL HIGH (ref 70–99)
Glucose-Capillary: 117 mg/dL — ABNORMAL HIGH (ref 70–99)
Glucose-Capillary: 123 mg/dL — ABNORMAL HIGH (ref 70–99)

## 2022-04-12 LAB — MAGNESIUM
Magnesium: 2.1 mg/dL (ref 1.7–2.4)
Magnesium: 2 mg/dL (ref 1.7–2.4)

## 2022-04-12 LAB — LACOSAMIDE: Lacosamide: 4.1 ug/mL — ABNORMAL LOW (ref 5.0–10.0)

## 2022-04-12 MED ORDER — LEVETIRACETAM 100 MG/ML PO SOLN: 750.0000 mg | Freq: Two times a day (BID) | ORAL | Status: DC

## 2022-04-12 MED ORDER — DEXMEDETOMIDINE HCL IN NACL 400 MCG/100ML IV SOLN: 0.0000 ug/kg/h | INTRAVENOUS | Status: DC

## 2022-04-12 MED ORDER — LEVETIRACETAM 100 MG/ML PO SOLN
500.0000 mg | Freq: Two times a day (BID) | ORAL | Status: DC
  Administered 2022-04-12 – 2022-04-13 (×3): 500 mg
  Filled 2022-04-12 (×3): qty 5

## 2022-04-12 NOTE — Assessment & Plan Note (Signed)
-  Increase Depakote to 750 twice daily -Continue lacosamide 100 twice daily -Continue gabapentin 400 mg 3 times daily - Continue Keppra for now

## 2022-04-12 NOTE — Progress Notes (Signed)
Subjective: Had an episode of left side stiffening overnight. More awake today.  ROS: Unable to obtain due to intubation  Examination  Vital signs in last 24 hours: Temp:  [98.2 F (36.8 C)-101.5 F (38.6 C)] 99.4 F (37.4 C) (06/21 1151) Pulse Rate:  [93-138] 105 (06/21 1300) Resp:  [6-22] 13 (06/21 1300) BP: (113-164)/(75-105) 147/90 (06/21 1300) SpO2:  [96 %-100 %] 98 % (06/21 1300) FiO2 (%):  [40 %] 40 % (06/21 1100)  General: lying in bed, NAD RS: intubated Neuro: Awake, looks at examiner, not following commands, PERLA, EOMI, withdraws to noxious stimuli in all extremities with left upper and lower extremity spasticity  Basic Metabolic Panel: Recent Labs  Lab 04/10/22 1317 04/11/22 0610 04/11/22 1020 04/11/22 1604 04/12/22 0700  NA 140 137  --   --   --   K 3.9 4.1  --   --   --   CL 105 103  --   --   --   CO2 19* 22  --   --   --   GLUCOSE 101* 98  --   --   --   BUN 13 8  --   --   --   CREATININE 0.76 0.68  --   --   --   CALCIUM 9.1 8.6*  --   --   --   MG 1.9 1.8 2.2 2.5* 2.1  PHOS  --  3.4 3.1 2.5 2.6    CBC: Recent Labs  Lab 04/10/22 1317 04/11/22 0610  WBC 5.0 9.7  NEUTROABS 3.7  --   HGB 16.6 16.3  HCT 50.4 48.4  MCV 91.5 90.5  PLT 277 219     Coagulation Studies: Recent Labs    04/10/22 1317  LABPROT 13.3  INR 1.0    Imaging No new imaging overnight   ASSESSMENT AND PLAN: 26 year old male with history of epilepsy who presented with focal convulsive status epilepticus which has since resolved.   Epilepsy with status epilepticus, resolved -No evidence of infection, Depakote level was low at 39. No eeg change with episode overnight   Recommendations - EEG was discontinued by critical care team - Continue Depakote to 1000 mg twice daily - Continue Keppra 500 mg twice daily on arrival.   However, will consider stopping tomorrow if patient remains seizure-free as we have already increased Depakote to minimize polypharmacy -Of note,  patient appears to be medically refractory at this point and will likely benefit from presurgical evaluation.  Will defer further management to primary epileptologist -Continue seizure precautions -As needed IV Ativan 2 mg for clinical seizure-like activity -Of note, at time of discharge, recommend prescribing rescue medication (intranasal Versed or Valtoco) -Management of rest of comorbidities per primary team -Plan discussed with Dr. Alena Bills   I have spent a total of  36  minutes with the patient reviewing hospital notes,  test results, labs and examining the patient as well as establishing an assessment and plan.  > 50% of time was spent in direct patient care.   Lindie Spruce Epilepsy [facility] For questions after 5pm please refer to AMION to reach the Neurologist on call

## 2022-04-12 NOTE — Assessment & Plan Note (Addendum)
No further EEG seizures - post ictal slowing only.   - Now off Versed.  - Awake and ready for extubation.

## 2022-04-12 NOTE — Progress Notes (Signed)
NAME:  Alex [name], MRN:  [id number], DOB:  Mar 27, 1996, LOS: 2 ADMISSION DATE:  04/10/2022, CONSULTATION DATE:  04/12/2022  REFERRING MD:  Amada Jupiter, Neuro , CHIEF COMPLAINT: Seizure  History of Present Illness:  26 year old man with a history of TBI and seizures and prior status epilepticus, presented to [facility] ED after a prolonged seizure for about 45 minutes , received 7.5 mg of Versed.  He was noted to be snoring and postictal on arrival, but remains unresponsive 2 hours later on neuro consultation with the episode of left eye deviation.  Valproate level was subtherapeutic.  He was loaded with Keppra, intubated for airway protection and transferred for LTM EEG monitoring due to suspicion for subclinical seizures.  Pertinent  Medical History  TBI and left spasticity Seizure disorder , last episode of status in February, admitted to [facility]-discharged on 750 valproate twice daily and lacosamide 100 twice daily  Significant Hospital Events: Including procedures, antibiotic start and stop dates in addition to other pertinent events   Head CT 6/19 multifocal encephalomalacia in bilateral frontal and temporal lobes and left parietal lobe, chronic EEG shows diffuse slowing with no epileptiform activity.  Interim History / Subjective:  Wean off versed. One questionable episode of arm shaking overnight treated as seizure.   Objective   Blood pressure 126/85, pulse (!) 102, temperature 100.1 F (37.8 C), temperature source Axillary, resp. rate 16, weight 79.1 kg, SpO2 97 %.    Vent Mode: PRVC FiO2 (%):  [40 %] 40 % Set Rate:  [16 bmp] 16 bmp Vt Set:  [560 mL] 560 mL PEEP:  [5 cmH20] 5 cmH20 Plateau Pressure:  [16 cmH20-18 cmH20] 16 cmH20   Intake/Output Summary (Last 24 hours) at 04/12/2022 9326 Last data filed at 04/12/2022 0600 Gross per 24 hour  Intake 2532.29 ml  Output 975 ml  Net 1557.29 ml    Filed Weights   04/11/22 0500  Weight: 79.1 kg    Examination: General:  Unresponsive, orally intubated young man HENT: Tracheostomy scar, eyes midline, pupils reactive to light, no JVD Lungs: No accessory muscle use, clear to auscultation Cardiovascular: S1-S2 tacky, sinus on monitor Abdomen: Soft, nontender, no hepatosplenomegaly Extremities: No deformity Neuro: Left upper and lower extremity spastic, right flaccid  Ancillary tests personally reviewed:   cEEG personally reviewed and shows diffuse slowing only.   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   - Now off Versed.   Seizure (HCC) -Increase Depakote to 750 twice daily -Continue lacosamide 100 twice daily -Continue gabapentin 400 mg 3 times daily - Continue Keppra for now   Traumatic brain injury (HCC) Generalized contractures.   -Continue baclofen - Hold Seroquel at this time.   Acute respiratory failure with hypoxia (HCC) Currently tolerating SBT   - Mental status will dictate timing of extubation.    Best Practice (right click and "Reselect all SmartList Selections" daily)   Diet/type: NPO start tube feeds  DVT prophylaxis: LMWH GI prophylaxis: N/A Lines: N/A Foley:  N/A Code Status:  full code Last date of multidisciplinary goals of care discussion [NA]  CRITICAL CARE Performed by: Lynnell Catalan   Total critical care time: 35 minutes  Critical care time was exclusive of separately billable procedures and treating other patients.  Critical care was necessary to treat or prevent imminent or life-threatening deterioration.  Critical care was time spent personally by me on the following activities: development of treatment plan  with patient and/or surrogate as well as nursing, discussions with consultants, evaluation of patient's response to treatment, examination of patient, obtaining history from patient or surrogate, ordering and performing treatments and interventions, ordering and  review of laboratory studies, ordering and review of radiographic studies, pulse oximetry, re-evaluation of patient's condition and participation in multidisciplinary rounds.  Lynnell Catalan, MD [facility] ICU Physician [facility] Critical [facility]  Pager: [phone] Mobile: [phone] After hours: [phone].   04/12/2022

## 2022-04-12 NOTE — Progress Notes (Signed)
eLink Physician-Brief Progress Note Patient Name: MAHMOOD BOEHRINGER III DOB: Sep 14, 1996 MRN: [id number]   Date of Service  04/12/2022  HPI/Events of Note  Patient with intermittent severe agitation, including biting down on the ET tube, compromising ventilation.  eICU Interventions  Low dose Precedex gtt ordered.        Thomasene Lot Chan Sheahan 04/12/2022, 10:21 PM

## 2022-04-12 NOTE — Procedures (Signed)
Patient Name: Alex [name]  MRN: [id number]  Epilepsy Attending: Charlsie Quest  Referring Physician/Provider: Gleason, Darcella Gasman, PA-C Duration: 04/12/2022 0215 to 04/12/2022 0840   Patient history: 26 year old male with history of epilepsy presented with breakthrough seizures.  EEG to evaluate for seizure.   Level of alertness:  comatose   AEDs during EEG study: LEV, VPA, LCM, GBP   Technical aspects: This EEG study was done with scalp electrodes positioned according to the 10-20 International system of electrode placement. Electrical activity was acquired at a sampling rate of 500Hz  and reviewed with a high frequency filter of 70Hz  and a low frequency filter of 1Hz . EEG data were recorded continuously and digitally stored.    Description: EEG showed continuous generalized and lateralized left hemisphere 3-5Hz  theta-delta delta slowing.  There is also 13 to 15 Hz beta activity in right frontal region. Hyperventilation and photic stimulation were not performed.     Event button was pressed on 04/12/2022 at 0227 during which per RN patient has stiffening of left arm and leg.  Concomitant EEG before, during and after the event did not show any EEG changes to suggest seizure.   ABNORMALITY -Continuous slow, generalized and lateralized left hemisphere   IMPRESSION: This study is suggestive of cortical dysfunction in left hemisphere likely secondary to underlying structural abnormality.  Additionally there is moderate diffuse encephalopathy.  No epileptiform discharges were seen throughout the recording.  Event button was pressed on 04/12/2022 for left upper and lower extremity stiffening without concomitant EEG change.  Focal motor seizures may not be seen on scalp EEG.  Therefore clinical correlation is recommended.   Amdrew Oboyle 

## 2022-04-12 NOTE — Assessment & Plan Note (Addendum)
Currently tolerating SBT and awake.  - Extubate following short SBT.

## 2022-04-12 NOTE — Assessment & Plan Note (Signed)
Generalized contractures.   -Continue baclofen - Hold Seroquel at this time.

## 2022-04-12 NOTE — Progress Notes (Signed)
LTM D/C'd. No skin break down. [facility] notified.

## 2022-04-13 ENCOUNTER — Other Ambulatory Visit: Payer: Self-pay

## 2022-04-13 DIAGNOSIS — R569 Unspecified convulsions: Secondary | ICD-10-CM | POA: Diagnosis not present

## 2022-04-13 DIAGNOSIS — G40901 Epilepsy, unspecified, not intractable, with status epilepticus: Secondary | ICD-10-CM | POA: Diagnosis not present

## 2022-04-13 LAB — GLUCOSE, CAPILLARY
Glucose-Capillary: 100 mg/dL — ABNORMAL HIGH (ref 70–99)
Glucose-Capillary: 104 mg/dL — ABNORMAL HIGH (ref 70–99)
Glucose-Capillary: 104 mg/dL — ABNORMAL HIGH (ref 70–99)
Glucose-Capillary: 106 mg/dL — ABNORMAL HIGH (ref 70–99)
Glucose-Capillary: 113 mg/dL — ABNORMAL HIGH (ref 70–99)
Glucose-Capillary: 145 mg/dL — ABNORMAL HIGH (ref 70–99)
Glucose-Capillary: 89 mg/dL (ref 70–99)

## 2022-04-13 MED ORDER — ORAL CARE MOUTH RINSE: 15.0000 mL | OROMUCOSAL | Status: DC | PRN

## 2022-04-13 MED ORDER — ORAL CARE MOUTH RINSE
15.0000 mL | OROMUCOSAL | Status: DC
  Administered 2022-04-13 – 2022-04-20 (×22): 15 mL via OROMUCOSAL

## 2022-04-13 NOTE — Plan of Care (Signed)
  Problem: Safety: Goal: Non-violent Restraint(s) Outcome: Progressing   Problem: Health Behavior/Discharge Planning: Goal: Ability to manage health-related needs will improve Outcome: Progressing   Problem: Clinical Measurements: Goal: Ability to maintain clinical measurements within normal limits will improve Outcome: Progressing   Problem: Safety: Goal: Ability to remain free from injury will improve Outcome: Progressing

## 2022-04-13 NOTE — Evaluation (Signed)
Clinical/Bedside Swallow Evaluation Patient Details  Name: Alex [name] MRN: [id number] Date of Birth: 08/17/1996  Today's Date: 04/13/2022 Time: SLP Start Time (ACUTE ONLY): 1327 SLP Stop Time (ACUTE ONLY): 1350 SLP Time Calculation (min) (ACUTE ONLY): 23 min  Past Medical History:  Past Medical History:  Diagnosis Date   Seizure disorder (HCC)    TBI (traumatic brain injury)    Past Surgical History:  Past Surgical History:  Procedure Laterality Date   GASTROSTOMY TUBE PLACEMENT     HPI:  Pt is a 26 y.o. male who presented to [facility] ED after a prolonged seizure for about 45 minutes.  He was snoring and postictal on arrival, remained unresponsive 2 hours later. He was intubated for airway protection (6/19- 6/22). CT head (6/19) neg. CXR (6/19) noted "Increased markings are seen in the left lower lung fields suggesting atelectasis/pneumonia". Previous BSE (06/04/21) revealed primary cognitive-based dysphagia, with dys 1/thin liquids recommended at that time.  PMH: TBI and seizures and prior status epilepticus.    Assessment / Plan / Recommendation  Clinical Impression  Pt alert and repositioned in upright position for swallow evaluation. Vocal quality was of low intensity and mildly breathy. Mother reports minimal expressive communication at baseline and consumption of soft foods. Due to reduced ability to follow commands, limited oral mechanism examination completed. During functional intake, pt noted with labial/lingual symmetry, adequate ROM and strength. Initial ice chips were significant for congested/wet coughing, likely secretions. Coughing became less frequent and more delayed with continued ice chips and sips of water via cup/straw. Symptoms continued to decrease and were often absent as intake progressed. Given clinical presentation, do not suspect overt coughing is related to liquids, but likely secretions at this time. Bites of puree and regular textures manipulated, masticated  and orally cleared without difficulty. Recommend initiation of mechanical soft diet/thin liquids with adherence to universal swallow precautions. He will require full supervision and assist for all meals. Discussed with RN and mother who are in agreement. SLP to f/u briefly for tolerance.  SLP Visit Diagnosis: Dysphagia, unspecified (R13.10)    Aspiration Risk  Mild aspiration risk    Diet Recommendation Dysphagia 3 (Mech soft);Thin liquid   Liquid Administration via: Cup;Straw Medication Administration: Crushed with puree Supervision: Staff to assist with self feeding;Full supervision/cueing for compensatory strategies Compensations: Minimize environmental distractions;Slow rate;Small sips/bites Postural Changes: Seated upright at 90 degrees    Other  Recommendations Oral Care Recommendations: Oral care BID    Recommendations for follow up therapy are one component of a multi-disciplinary discharge planning process, led by the attending physician.  Recommendations may be updated based on patient status, additional functional criteria and insurance authorization.  Follow up Recommendations Other (comment) (TBD)      Assistance Recommended at Discharge None  Functional Status Assessment Patient has had a recent decline in their functional status and demonstrates the ability to make significant improvements in function in a reasonable and predictable amount of time.  Frequency and Duration min 2x/week  2 weeks       Prognosis Prognosis for Safe Diet Advancement: Good Barriers to Reach Goals: Cognitive deficits      Swallow Study   General Date of Onset: 04/13/22 HPI: Pt is a 26 y.o. male who presented to [facility] ED after a prolonged seizure for about 45 minutes.  He was snoring and postictal on arrival, remained unresponsive 2 hours later. He was intubated for airway protection (6/19- 6/22). CT head (6/19) neg. CXR (6/19) noted "Increased markings are  seen in the left lower lung  fields suggesting atelectasis/pneumonia". Previous BSE (06/04/21) revealed primary cognitive-based dysphagia, with dys 1/thin liquids recommended at that time.  PMH: TBI and seizures and prior status epilepticus. Type of Study: Bedside Swallow Evaluation Previous Swallow Assessment: see HPI Diet Prior to this Study: Dysphagia 3 (soft);Thin liquids Temperature Spikes Noted: Yes (99.9) Respiratory Status: Nasal cannula History of Recent Intubation: Yes Length of Intubations (days): 4 days Date extubated: 04/13/22 Behavior/Cognition: Alert;Cooperative;Requires cueing Oral Cavity Assessment: Within Functional Limits Oral Care Completed by SLP: Yes Oral Cavity - Dentition: Adequate natural dentition Vision:  (unable to self-feed) Self-Feeding Abilities: Total assist Patient Positioning: Upright in bed;Postural control adequate for testing Baseline Vocal Quality: Normal;Breathy;Low vocal intensity Volitional Cough: Strong    Oral/Motor/Sensory Function Overall Oral Motor/Sensory Function: Within functional limits   Ice Chips Ice chips: Within functional limits Presentation: Spoon Other Comments: coughing up of secretions with initial ice chips   Thin Liquid Thin Liquid: Within functional limits Presentation: Cup;Straw Other Comments: delayed inconsitent coughing, suspect largely of secretions    Nectar Thick Nectar Thick Liquid: Not tested   Honey Thick Honey Thick Liquid: Not tested   Puree Puree: Within functional limits Presentation: Spoon   Solid     Solid: Within functional limits       Avie Echevaria, MA, CCC-SLP Acute Rehabilitation Services Office Number: [phone]  Paulette Blanch 04/13/2022,2:14 PM

## 2022-04-14 ENCOUNTER — Encounter (HOSPITAL_COMMUNITY): Payer: Self-pay | Admitting: Pulmonary Disease

## 2022-04-14 ENCOUNTER — Inpatient Hospital Stay (HOSPITAL_COMMUNITY): Payer: Medicaid Other

## 2022-04-14 DIAGNOSIS — R569 Unspecified convulsions: Secondary | ICD-10-CM | POA: Diagnosis not present

## 2022-04-14 LAB — GLUCOSE, CAPILLARY
Glucose-Capillary: 103 mg/dL — ABNORMAL HIGH (ref 70–99)
Glucose-Capillary: 73 mg/dL (ref 70–99)
Glucose-Capillary: 117 mg/dL — ABNORMAL HIGH (ref 70–99)
Glucose-Capillary: 121 mg/dL — ABNORMAL HIGH (ref 70–99)
Glucose-Capillary: 131 mg/dL — ABNORMAL HIGH (ref 70–99)
Glucose-Capillary: 129 mg/dL — ABNORMAL HIGH (ref 70–99)

## 2022-04-14 MED ORDER — OSMOLITE 1.5 CAL PO LIQD
996.0000 mL | ORAL | Status: DC
  Filled 2022-04-14 (×3): qty 1000

## 2022-04-14 NOTE — Progress Notes (Signed)
   NAME:  Alex [name], MRN:  [id number], DOB:  1996/01/17, LOS: 4 ADMISSION DATE:  04/10/2022, CONSULTATION DATE:  04/14/2022  REFERRING MD:  Amada Jupiter, Neuro , CHIEF COMPLAINT: Seizure  History of Present Illness:  26 year old man with a history of TBI and seizures and prior status epilepticus, presented to [facility] ED after a prolonged seizure for about 45 minutes , received 7.5 mg of Versed.  He was noted to be snoring and postictal on arrival, but remains unresponsive 2 hours later on neuro consultation with the episode of left eye deviation.  Valproate level was subtherapeutic.  He was loaded with Keppra, intubated for airway protection and transferred for LTM EEG monitoring due to suspicion for subclinical seizures.  Pertinent  Medical History  TBI and left spasticity Seizure disorder , last episode of status in February, admitted to [facility]-discharged on 750 valproate twice daily and lacosamide 100 twice daily  Significant Hospital Events: Including procedures, antibiotic start and stop dates in addition to other pertinent events   Head CT 6/19 multifocal encephalomalacia in bilateral frontal and temporal lobes and left parietal lobe, chronic EEG shows diffuse slowing with no epileptiform activity. 6/21 - now awake and tolerating SBT  Interim History / Subjective:  Now awake   Objective   Blood pressure (!) 151/74, pulse (!) 112, temperature 99.3 F (37.4 C), temperature source Axillary, resp. rate (!) 24, height 5\' 9"  (1.753 m), weight 78.3 kg, SpO2 92 %.    Vent Mode: PSV;CPAP FiO2 (%):  [40 %] 40 % PEEP:  [5 cmH20] 5 cmH20 Pressure Support:  [10 cmH20] 10 cmH20   Intake/Output Summary (Last 24 hours) at 04/14/2022 4098 Last data filed at 04/14/2022 1191 Gross per 24 hour  Intake 2933.24 ml  Output 2850 ml  Net 83.24 ml    Filed Weights   04/11/22 0500 04/14/22 4782  Weight: 79.1 kg 78.3 kg    Examination: General: Unresponsive, orally intubated young man HENT:  Tracheostomy scar, eyes midline, pupils reactive to light, no JVD Lungs: No accessory muscle use, clear to auscultation Cardiovascular: S1-S2 tacky, sinus on monitor Abdomen: Soft, nontender, no hepatosplenomegaly Extremities: No deformity Neuro: Left upper and lower extremity spastic, right flaccid, awake and moving purposefully. 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   - Now off Versed.  - Awake and ready for extubation.   Seizure (HCC) -Increase Depakote to 750 twice daily -Continue lacosamide 100 twice daily -Continue gabapentin 400 mg 3 times daily - Continue Keppra for now   Traumatic brain injury (HCC) Generalized contractures.  Awake but not following commands   -Continue baclofen - Hold Seroquel at this time.  - Cortrak to ensure getting meds - Monitor oral intake.   Acute respiratory failure with hypoxia (HCC) Resolved: extubated and on room air.  Ready for transfer. Orders reconciled and TRH notified.   Best Practice (right click and "Reselect all SmartList Selections" daily)   Diet/type: NPO start tube feeds  DVT prophylaxis: LMWH GI prophylaxis: N/A Lines: N/A Foley:  N/A Code Status:  full code Last date of multidisciplinary goals of care discussion [family updated 6/22]  Lynnell Catalan, MD [facility] ICU Physician [facility] [place] [facility]  Pager: [phone] Mobile: [phone] After hours: [phone].   04/14/2022

## 2022-04-14 NOTE — TOC Initial Note (Signed)
Transition of Care [facility]) - Initial/Assessment Note    Patient Details  Name: Alex [name] MRN: [id number] Date of Birth: 10/05/1996  [facility]) CM/SW Contact:    Mearl Latin, LCSW Phone Number: 04/14/2022, 10:22 AM  Clinical Narrative:                 Per [facility], nothing has been found thus far to indicate negligence at [facility] SNF. She will continue to follow.   Expected Discharge Plan: Skilled Nursing Facility Barriers to Discharge: Continued Medical Work up   Patient Goals and CMS Choice Patient states their goals for this hospitalization and ongoing recovery are:: Return to ltc CMS Medicare.gov Compare Post Acute Care list provided to:: Patient Represenative (must comment) Choice offered to / list presented to : Parent  Expected Discharge Plan and Services Expected Discharge Plan: Skilled Nursing Facility In-house Referral: Clinical Social Work   Post Acute Care Choice: Skilled Nursing Facility Living arrangements for the past 2 months: Skilled Nursing Facility                                      Prior Living Arrangements/Services Living arrangements for the past 2 months: Skilled Nursing Facility Lives with:: Facility Resident Patient language and need for interpreter reviewed:: Yes Do you feel safe going back to the place where you live?: Yes      Need for Family Participation in Patient Care: Yes (Comment) Care giver support system in place?: Yes (comment)   Criminal Activity/Legal Involvement Pertinent to Current Situation/Hospitalization: No - Comment as needed  Activities of Daily Living Home Assistive Devices/Equipment: None ADL Screening (condition at time of admission) Patient's cognitive ability adequate to safely complete daily activities?: No Is the patient deaf or have difficulty hearing?: No Does the patient have difficulty seeing, even when wearing glasses/contacts?: No Does the patient have difficulty  concentrating, remembering, or making decisions?: Yes Patient able to express need for assistance with ADLs?: No Does the patient have difficulty dressing or bathing?: Yes Independently performs ADLs?: No Communication: Dependent Is this a change from baseline?: Pre-admission baseline Dressing (OT): Dependent Is this a change from baseline?: Pre-admission baseline Grooming: Dependent Is this a change from baseline?: Pre-admission baseline Feeding: Dependent Is this a change from baseline?: Pre-admission baseline Bathing: Dependent Is this a change from baseline?: Pre-admission baseline Toileting: Dependent Is this a change from baseline?: Pre-admission baseline In/Out Bed: Dependent Is this a change from baseline?: Pre-admission baseline Walks in Home: Dependent Is this a change from baseline?: Pre-admission baseline Does the patient have difficulty walking or climbing stairs?: Yes Weakness of Legs: Both Weakness of Arms/Hands: Left  Permission Sought/Granted Permission sought to share information with : Facility Medical sales representative, Family Supports Permission granted to share information with : No  Share Information with NAME: Carollee Herter "Cala [place]"  Permission granted to share info w AGENCY: [place]  Permission granted to share info w Relationship: Mother  Permission granted to share info w Contact Information: [phone]  Emotional Assessment Appearance:: Appears stated age Attitude/Demeanor/Rapport: Unable to Assess Affect (typically observed): Unable to Assess Orientation: :  (TBI) Alcohol / Substance Use: Not Applicable Psych Involvement: No (comment)  Admission diagnosis:  Seizure [facility]) [R56.9] Patient Active Problem List   Diagnosis Date Noted   Acute respiratory failure with hypoxia (HCC) 04/12/2022   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   Seizure (HCC) 09/11/2021   Status epilepticus (  HCC) 05/31/2021   Endotracheally intubated 05/31/2021   Traumatic brain injury  (HCC) 05/31/2021   Malnutrition of moderate degree 05/31/2021   PCP:  [facility] Pharmacy:  No Pharmacies Listed    Social Determinants of Health (SDOH) Interventions    Readmission Risk Interventions     No data to display

## 2022-04-14 NOTE — NC FL2 (Addendum)
[place] MEDICAID FL2 LEVEL OF CARE SCREENING TOOL     IDENTIFICATION  Patient Name: Alex [name] Birthdate: May 26, 1996 Sex: male Admission Date (Current Location): 04/10/2022  [facility] and [place] Number:  Producer, television/film/video and Address:  [facility] [place] [facility], [address], [place], [place] [address]      Provider Number: [id number]  Attending Physician Name and Address:  Lynnell Catalan, MD  Relative Name and Phone Number:       Current Level of Care: Hospital Recommended Level of Care:  SNF Prior Approval Number:    Date Approved/Denied:   PASRR Number:    Discharge Plan:  SNF    Current Diagnoses: Patient Active Problem List   Diagnosis Date Noted   Acute respiratory failure with hypoxia (HCC) 04/12/2022   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   Seizure (HCC) 09/11/2021   Status epilepticus (HCC) 05/31/2021   Endotracheally intubated 05/31/2021   Traumatic brain injury (HCC) 05/31/2021   Malnutrition of moderate degree 05/31/2021    Orientation RESPIRATION BLADDER Height & Weight   Hx of TBI  Normal Incontinent (external catheter) Weight: 172 lb 9.9 oz (78.3 kg) Height:  5\' 9"  (175.3 cm)  BEHAVIORAL SYMPTOMS/MOOD NEUROLOGICAL BOWEL NUTRITION STATUS    Convulsions/Seizures Continent Diet (See discharge summary.)  AMBULATORY STATUS COMMUNICATION OF NEEDS Skin   Total Care Verbally (Difficulty speaking.) Other (MASD on coccyx)                       Personal Care Assistance Level of Assistance  Bathing, Feeding, Dressing Bathing Assistance: Maximum assistance Feeding assistance: Maximum assistance Dressing Assistance: Maximum assistance     Functional Limitations Info  Sight, Hearing Sight Info: Adequate Hearing Info: Adequate Speech Info: Adequate    SPECIAL CARE FACTORS FREQUENCY  PT (By licensed PT), OT (By licensed OT)     PT Frequency: 5x/week OT Frequency: 5x/week            Contractures Contractures Info: Not present     Additional Factors Info  Code Status, Allergies Code Status Info: Full Allergies Info: No known allergies.           Current Medications (04/14/2022):  This is the current hospital active medication list Current Facility-Administered Medications  Medication Dose Route Frequency Provider Last Rate Last Admin   acetaminophen (TYLENOL) tablet 650 mg  650 mg Per NG tube Q6H PRN Henry Russel, MD   650 mg at 04/13/22 2009   baclofen (LIORESAL) tablet 10 mg  10 mg Per Tube TID Gleason, Darcella Gasman, PA-C   10 mg at 04/14/22 8119   Chlorhexidine Gluconate Cloth 2 % PADS 6 each  6 each Topical Daily Oretha Milch, MD   6 each at 04/14/22 0935   docusate (COLACE) 50 MG/5ML liquid 100 mg  100 mg Per Tube BID PRN Gleason, Darcella Gasman, PA-C       feeding supplement (OSMOLITE 1.5 CAL) liquid 1,000 mL  1,000 mL Per Tube Continuous Lynnell Catalan, MD   Stopped at 04/13/22 0900   feeding supplement (PROSource TF) liquid 45 mL  45 mL Per Tube BID Lynnell Catalan, MD   45 mL at 04/14/22 0920   fentaNYL (SUBLIMAZE) injection 50 mcg  50 mcg Intravenous Q1H PRN Lynnell Catalan, MD   50 mcg at 04/13/22 0449   gabapentin (NEURONTIN) 250 MG/5ML solution 400 mg  400 mg Per Tube TID Lynnell Catalan, MD   400 mg at 04/14/22 0921   heparin  injection 5,000 Units  5,000 Units Subcutaneous Q8H Gleason, Darcella Gasman, PA-C   5,000 Units at 04/14/22 0604   insulin aspart (novoLOG) injection 0-9 Units  0-9 Units Subcutaneous Q4H Henry Russel, MD   1 Units at 04/13/22 0056   lacosamide (VIMPAT) tablet 100 mg  100 mg Per Tube BID Lynnell Catalan, MD   100 mg at 04/14/22 9562   midazolam (VERSED) bolus via infusion 4 mg  4 mg Intravenous Q15 min PRN Lynnell Catalan, MD   4 mg at 04/12/22 1920   mupirocin ointment (BACTROBAN) 2 % 1 Application  1 Application Nasal BID Oretha Milch, MD   1 Application at 04/14/22 1018   Oral care mouth rinse  15 mL Mouth Rinse 4 times per day Lynnell Catalan, MD   15 mL at 04/14/22 0853   Oral care mouth rinse   15 mL Mouth Rinse PRN Lynnell Catalan, MD       pantoprazole sodium (PROTONIX) 40 mg/20 mL oral suspension 40 mg  40 mg Per Tube Daily Gleason, Darcella Gasman, PA-C   40 mg at 04/14/22 0921   polyethylene glycol (MIRALAX / GLYCOLAX) packet 17 g  17 g Per Tube Daily PRN Gleason, Darcella Gasman, PA-C       valproic acid (DEPAKENE) 250 MG/5ML solution 1,000 mg  1,000 mg Per Tube BID Charlsie Quest, MD   1,000 mg at 04/14/22 0920     Discharge Medications: Please see discharge summary for a list of discharge medications.  Relevant Imaging Results:  Relevant Lab Results:   Additional Information SSN: [id number]  Dale Tatamy, Student-Social Work

## 2022-04-14 NOTE — Evaluation (Signed)
Physical Therapy Evaluation Patient Details Name: Alex [name] MRN: [id number] DOB: 02/05/1996 Today's Date: 04/14/2022  History of Present Illness  The pt is a 26 yo male presenting from SNF on 6/19 after 45 min of prolonged seizure. Intubated upon arrival, extubated 6/22. 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   Clinical Impression  Pt in bed upon arrival of PT, agreeable to evaluation at this time. Prior to admission the pt requires total assist from staff for all bed mobility, is primarily bedbound at SNF and does not tolerate HOB elevation past 45 deg. The pt's mother reports that she and the SNF staff perform PROM to the pt's LE and he is receiving OT to manage LUE ROM.  The pt now presents with limitations in mobility, strength, and ROM, all impacted by chronic TBI and lack of command following. Per mother's report (she was present through the session), the pt is at his functional baseline and has all assist and equipment available to him at [facility]. She feels she can continue to manage PROM to LE to maintain mobility and ROM. The pt requires totalA for all mobility, and did not assist with bed mobility at time of eval, but is noted to be able to reposition himself volitionally intermittently throughout session. No further acute PT needs, will add OT consult for continued management of LUE. Please feel free to re-consult if change in status or needs.         Recommendations for follow up therapy are one component of a multi-disciplinary discharge planning process, led by the attending physician.  Recommendations may be updated based on patient status, additional functional criteria and insurance authorization.  Follow Up Recommendations Skilled nursing-short term rehab (<3 hours/day) (resume PT at [facility]) Can patient physically be transported by private vehicle: No     Assistance Recommended at Discharge Frequent or constant Supervision/Assistance  Patient can return home with the following  Two people to help with walking and/or transfers;Two people to help with bathing/dressing/bathroom;Assistance with feeding;Direct supervision/assist for medications management;Assist for transportation;Direct supervision/assist for financial management;Help with stairs or ramp for entrance    Equipment Recommendations None recommended by PT  Recommendations for Other Services  OT consult    Functional Status Assessment Patient has not had a recent decline in their functional status     Precautions / Restrictions Precautions Precautions: Fall Precaution Comments: seizure, chronic TBI with L-sided spasticity Restrictions Weight Bearing Restrictions: No      Mobility  Bed Mobility Overal bed mobility: Needs Assistance Bed Mobility: Rolling Rolling: Total assist, +2 for physical assistance, +2 for safety/equipment         General bed mobility comments: pt not assisting with mobility at this time. totalA of 2    Transfers                   General transfer comment: n/t    Ambulation/Gait               General Gait Details: pt is not ambulatory at baseline         Pertinent Vitals/Pain Pain Assessment Pain Assessment: Faces Faces Pain Scale: Hurts a little bit Pain Location: grimacing with end ROM of LUE Pain Descriptors / Indicators: Grimacing Pain Intervention(s): Limited activity within patient's tolerance, Monitored during session, Repositioned    Home Living Family/patient expects to be discharged to:: Skilled nursing facility  Prior Function Prior Level of Function : Needs assist       Physical Assist : Mobility (physical);ADLs (physical) Mobility (physical): Bed mobility;Transfers ADLs (physical): Grooming;Feeding;Bathing;Dressing;Toileting;IADLs Mobility Comments: pt bedbound, per  mother, staff may transfer him to [facility] but pt unable to tolerate sitting upright more than 45 deg. ADLs Comments: pt dependent in ADLs other than being able to feed himself with RUE finger foods, staff present and assists with all other ADLs     Hand Dominance   Dominant Hand: Right    Extremity/Trunk Assessment   Upper Extremity Assessment Upper Extremity Assessment: Defer to OT evaluation (chronic spasticity of LUE,pt's mother reports he is undergoing arrgressive therapies to LUE at SNF to improve ROM)    Lower Extremity Assessment Lower Extremity Assessment: LLE deficits/detail;RLE deficits/detail;Difficult to assess due to impaired cognition RLE Deficits / Details: no active movement to command, good muscle bulk. functional ROM LLE Deficits / Details: chronic spasticity at toes, ankle, knee, and hip. pt's mother and staff assist with PROM and massage    Cervical / Trunk Assessment Cervical / Trunk Assessment: Other exceptions Cervical / Trunk Exceptions: pt does not tolerate HOB elevation past 45 deg due to poor neck control. pt able to volitionally turn his head to L and R without assist. does not do to command  Communication   Communication: Expressive difficulties (from TBI, only states "fuck you" "I love you" or "yes" but not always in appropriate response to questions. pt does not follow commands)  Cognition Arousal/Alertness: Awake/alert Behavior During Therapy: Flat affect, Restless Overall Cognitive Status: History of cognitive impairments - at baseline                                 General Comments: per pt's mother, he is close to his baseline after TBI. does not follow commands, but will react to environment and turn/visually track familiar voices, faces, and to stimulation. reactive to movements on R side        General Comments General comments (skin integrity, edema, etc.): VSS on RA    Exercises General Exercises - Upper Extremity Shoulder  Flexion: PROM, Left, 5 reps, Supine, Limitations Shoulder Flexion Limitations: chronic ROM limitations <90 deg Shoulder ABduction: PROM, Right, 5 reps, Supine Elbow Flexion: PROM, Left, 5 reps, Other reps (comment) Elbow Extension: PROM, Left, 5 reps, Supine, Limitations Elbow Extension Limitations: unable to achieve full extension General Exercises - Lower Extremity Ankle Circles/Pumps: PROM, Both, 5 reps, Supine Heel Slides: PROM, Both, 5 reps, Supine Hip Flexion/Marching: PROM, Both, 5 reps, Supine   Assessment/Plan    PT Assessment All further PT needs can be met in the next venue of care  PT Problem List Decreased range of motion;Decreased activity tolerance           PT Goals (Current goals can be found in the Care Plan section)  Acute Rehab PT Goals Patient Stated Goal: none stated, maintain ROM in LUE per pt's mother PT Goal Formulation: All assessment and education complete, DC therapy     AM-PAC PT "6 Clicks" Mobility  Outcome Measure Help needed turning from your back to your side while in a flat bed without using bedrails?: Total Help needed moving from lying on your back to sitting on the side of a flat bed without using bedrails?: Total Help needed moving to and from a bed to a chair (including a wheelchair)?: Total Help needed standing up from a chair  using your arms (e.g., wheelchair or bedside chair)?: Total Help needed to walk in hospital room?: Total Help needed climbing 3-5 steps with a railing? : Total 6 Click Score: 6    End of Session   Activity Tolerance: Patient limited by pain Patient left: in bed;with call bell/phone within reach;with bed alarm set;with family/visitor present;with restraints reapplied Nurse Communication: Mobility status PT Visit Diagnosis: Muscle weakness (generalized) (M62.81)    Time: 1610-9604 PT Time Calculation (min) (ACUTE ONLY): 26 min   Charges:   PT Evaluation $PT Eval Moderate Complexity: 1 Mod PT  Treatments $Therapeutic Exercise: 8-22 mins        Vickki Muff, PT, DPT   Acute Rehabilitation Department  Ronnie Derby 04/14/2022, 6:15 PM

## 2022-04-14 NOTE — Progress Notes (Signed)
Nutrition Follow-up  DOCUMENTATION CODES:   Not applicable  INTERVENTION:   Nocturnal tube feeds via cortrak tube: - Osmolite 1.5 @ 83 ml/hr x 12 hours (996 ml/day) Run from 1800 - 0600  Tube feeding regimen provides 1494 kcal, 62 grams of protein, and 756 ml of H2O.   Transition off TF as appropriate  NUTRITION DIAGNOSIS:   Inadequate oral intake related to inability to eat as evidenced by NPO status. Ongoing.   GOAL:   Patient will meet greater than or equal to 90% of their needs Met with TF at goal.   MONITOR:   TF tolerance  REASON FOR ASSESSMENT:   Ventilator, Consult Enteral/tube feeding initiation and management  ASSESSMENT:   26 year old male who presented to the ED on 6/19 with seizures. Pt required intubation in the ED. PMH of TBI and seizures.  Pt with no further seizures per EEG, extubated.  Pt eating; consumed 75% of lunch   6/19 intubated 6/22 extubated; started on Dysphagia 3 diet with thin liquids  6/23 cortrak placed; tip gastric  Medications reviewed and include: SSI, protonix  Labs reviewed: CBG's: 73-121   Diet Order:   Diet Order             DIET DYS 3 Room service appropriate? No; Fluid consistency: Thin  Diet effective now                   EDUCATION NEEDS:   No education needs have been identified at this time  Skin:  Skin Assessment: Reviewed RN Assessment (irritant dermatitis to coccyx)  Last BM:  6/22  Height:   Ht Readings from Last 1 Encounters:  04/13/22 5\' 9"  (1.753 m)    Weight:   Wt Readings from Last 1 Encounters:  04/14/22 78.3 kg    BMI:  Body mass index is 25.49 kg/m.  Estimated Nutritional Needs:   Kcal:  2100-2300  Protein:  105-120 grams  Fluid:  >2.0 L  Khyra Viscuso P., RD, LDN, CNSC See AMiON for contact information

## 2022-04-15 ENCOUNTER — Inpatient Hospital Stay (HOSPITAL_COMMUNITY): Payer: Medicaid Other

## 2022-04-15 DIAGNOSIS — R569 Unspecified convulsions: Secondary | ICD-10-CM | POA: Diagnosis not present

## 2022-04-15 LAB — CBC WITH DIFFERENTIAL/PLATELET
Abs Immature Granulocytes: 0.05 10*3/uL (ref 0.00–0.07)
Basophils Absolute: 0 10*3/uL (ref 0.0–0.1)
Basophils Relative: 1 %
Eosinophils Absolute: 0.3 10*3/uL (ref 0.0–0.5)
Eosinophils Relative: 7 %
HCT: 47 % (ref 39.0–52.0)
Hemoglobin: 15.5 g/dL (ref 13.0–17.0)
Immature Granulocytes: 1 %
Lymphocytes Relative: 33 %
Lymphs Abs: 1.7 10*3/uL (ref 0.7–4.0)
MCH: 30.2 pg (ref 26.0–34.0)
MCHC: 33 g/dL (ref 30.0–36.0)
MCV: 91.6 fL (ref 80.0–100.0)
Monocytes Absolute: 0.6 10*3/uL (ref 0.1–1.0)
Monocytes Relative: 11 %
Neutro Abs: 2.4 10*3/uL (ref 1.7–7.7)
Neutrophils Relative %: 47 %
Platelets: 286 10*3/uL (ref 150–400)
RBC: 5.13 MIL/uL (ref 4.22–5.81)
RDW: 12.4 % (ref 11.5–15.5)
WBC: 5 10*3/uL (ref 4.0–10.5)
nRBC: 0 % (ref 0.0–0.2)

## 2022-04-15 LAB — COMPREHENSIVE METABOLIC PANEL
ALT: 20 U/L (ref 0–44)
AST: 20 U/L (ref 15–41)
Albumin: 3.4 g/dL — ABNORMAL LOW (ref 3.5–5.0)
Alkaline Phosphatase: 69 U/L (ref 38–126)
Anion gap: 11 (ref 5–15)
BUN: 11 mg/dL (ref 6–20)
CO2: 25 mmol/L (ref 22–32)
Calcium: 9.4 mg/dL (ref 8.9–10.3)
Chloride: 103 mmol/L (ref 98–111)
Creatinine, Ser: 0.7 mg/dL (ref 0.61–1.24)
GFR, Estimated: >60 mL/min (ref >60)
Glucose, Bld: 100 mg/dL — ABNORMAL HIGH (ref 70–99)
Potassium: 4.4 mmol/L (ref 3.5–5.1)
Sodium: 139 mmol/L (ref 135–145)
Total Bilirubin: 0.6 mg/dL (ref 0.3–1.2)
Total Protein: 6.6 g/dL (ref 6.5–8.1)

## 2022-04-15 LAB — PROCALCITONIN: Procalcitonin: <0.1 ng/mL

## 2022-04-15 LAB — CULTURE, BLOOD (ROUTINE X 2)
Culture: NO GROWTH
Culture: NO GROWTH

## 2022-04-15 LAB — GLUCOSE, CAPILLARY
Glucose-Capillary: 104 mg/dL — ABNORMAL HIGH (ref 70–99)
Glucose-Capillary: 107 mg/dL — ABNORMAL HIGH (ref 70–99)
Glucose-Capillary: 124 mg/dL — ABNORMAL HIGH (ref 70–99)
Glucose-Capillary: 126 mg/dL — ABNORMAL HIGH (ref 70–99)
Glucose-Capillary: 97 mg/dL (ref 70–99)
Glucose-Capillary: 99 mg/dL (ref 70–99)

## 2022-04-15 LAB — MAGNESIUM: Magnesium: 2 mg/dL (ref 1.7–2.4)

## 2022-04-15 LAB — C-REACTIVE PROTEIN: CRP: 3.5 mg/dL — ABNORMAL HIGH (ref <1.0)

## 2022-04-15 MED ORDER — LORAZEPAM 0.5 MG PO TABS
0.5000 mg | ORAL_TABLET | Freq: Every day | ORAL | Status: DC
  Administered 2022-04-15 – 2022-04-19 (×5): 0.5 mg via ORAL
  Filled 2022-04-15 (×6): qty 1

## 2022-04-15 NOTE — Evaluation (Signed)
Occupational Therapy Evaluation Patient Details Name: Alex [name] MRN: [id number] DOB: 10-27-1995 Today's Date: 04/15/2022   History of Present Illness The pt is a 26 yo male presenting from SNF on 6/19 after 45 min of prolonged seizure. Intubated upon arrival, extubated 6/22. 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   Clinical Impression   Pt is a LTC resident of a SNF due to TBI with plan to eventually DC home with his Mom. Pt is total care however Mom asking about management of his LUE contracture. He is apparently receiving Botox injections in his Biceps. Will return with a palm guard. Skin in palm, in between fingers gets macerated. Would most likely benefit from weaving Interdry between web spaces of digits and into palm to wick away moisture.     Recommendations for follow up therapy are one component of a multi-disciplinary discharge planning process, led by the attending physician.  Recommendations may be updated based on patient status, additional functional criteria and insurance authorization.   Follow Up Recommendations  Other (comment) (follow with OT at SNF for positioning needs)    Assistance Recommended at Discharge  Return to SNF  Patient can return home with the following      Functional Status Assessment     Equipment Recommendations  None recommended by OT    Recommendations for Other Services       Precautions / Restrictions Precautions Precautions: Fall Precaution Comments: seizure, chronic TBI with L-sided spasticity and contractures Restrictions Weight Bearing Restrictions: No      Mobility Bed Mobility Overal bed mobility: Needs Assistance Bed Mobility: Rolling Rolling: Total assist, +2 for physical assistance, +2 for safety/equipment         General bed mobility comments: pt not assisting with mobility at this time.  totalA of 2    Transfers                   General transfer comment: n/t      Balance                                           ADL either performed or assessed with clinical judgement   ADL                                               Vision         Perception     Praxis      Pertinent Vitals/Pain Pain Assessment Pain Assessment: Faces Faces Pain Scale: Hurts even more Pain Location: grimacing with end ROM of LUE Pain Descriptors / Indicators: Grimacing, Moaning Pain Intervention(s): Limited activity within patient's tolerance     Hand Dominance Right   Extremity/Trunk Assessment Upper Extremity Assessment Upper Extremity Assessment: LUE deficits/detail LUE Deficits / Details: flexor contracture with spasticity; undergoing Botox injections in bicep area per Mom; plan is to address hand/wrist at later time LUE Coordination:  (non-functional)   Lower Extremity Assessment Lower Extremity Assessment: Defer to PT evaluation LLE Deficits / Details: chronic spasticity at toes, ankle, knee, and hip. pt's mother and staff assist with PROM and massage   Cervical / Trunk Assessment Cervical /  Trunk Assessment: Other exceptions Cervical / Trunk Exceptions: pt does not tolerate HOB elevation past 45 deg due to poor neck control. pt able to volitionally turn his head to L and R without assist. does not do to command   Communication Communication Communication: Expressive difficulties (from TBI, only states "fuck you" "I love you" or "yes" but not always in appropriate response to questions. pt does not follow commands)   Cognition Arousal/Alertness: Awake/alert Behavior During Therapy: Flat affect, Restless Overall Cognitive Status: History of cognitive impairments - at baseline                                 General Comments: per pt's mother, he is close to his baseline after TBI. does not follow commands, but  will react to environment and turn/visually track familiar voices, faces, and to stimulation. reactive to movements on R side     General Comments       Exercises Exercises: General Upper Extremity, General Lower Extremity General Exercises - Upper Extremity Shoulder Flexion: PROM, Left, 5 reps, Supine, Limitations Shoulder ABduction: PROM, Right, 5 reps, Supine Elbow Flexion: PROM, Left, 5 reps, Other reps (comment) Elbow Extension: PROM, Left, 5 reps, Supine, Limitations Composite Extension: PROM, Left, 5 reps   Shoulder Instructions      Home Living Family/patient expects to be discharged to:: Skilled nursing facility                                        Prior Functioning/Environment Prior Level of Function : Needs assist       Physical Assist : Mobility (physical);ADLs (physical) Mobility (physical): Bed mobility;Transfers ADLs (physical): Grooming;Feeding;Bathing;Dressing;Toileting;IADLs Mobility Comments: pt bedbound, per mother, staff may transfer him to [facility] but pt unable to tolerate sitting upright more than 45 deg. ADLs Comments: pt dependent in ADLs other than being able to feed himself with RUE finger foods, staff present and assists with all other ADLs        OT Problem List: Impaired sensation;Impaired tone;Impaired UE functional use;Pain;Other (comment) (contracture)      OT Treatment/Interventions: Splinting;Patient/family education    OT Goals(Current goals can be found in the care plan section) Acute Rehab OT Goals Patient Stated Goal: per Mom to find something to prevent nails form digging into palm OT Goal Formulation: With family Time For Goal Achievement: 04/29/22 Potential to Achieve Goals: Good  OT Frequency: Min 2X/week    Co-evaluation              AM-PAC OT "6 Clicks" Daily Activity     Outcome Measure Help from another person eating meals?: Total Help from another person taking care of personal grooming?: Total Help  from another person toileting, which includes using toliet, bedpan, or urinal?: Total Help from another person bathing (including washing, rinsing, drying)?: Total Help from another person to put on and taking off regular upper body clothing?: Total Help from another person to put on and taking off regular lower body clothing?: Total 6 Click Score: 6   End of Session Nurse Communication: Other (comment) (positioning)  Activity Tolerance: Patient tolerated treatment well Patient left: in bed;with call bell/phone within reach;with family/visitor present  OT Visit Diagnosis: Muscle weakness (generalized) (M62.81);Other (comment) (contracture)                Time: 1610-9604 OT Time  Calculation (min): 15 min Charges:  OT General Charges $OT Visit: 1 Visit OT Evaluation $OT Eval Moderate Complexity: 1 Mod  Zipporah Finamore, OT/L   Acute OT Clinical Specialist Acute Rehabilitation Services Pager [phone] Office [phone]   [facility] 04/15/2022, 3:22 PM

## 2022-04-16 ENCOUNTER — Inpatient Hospital Stay (HOSPITAL_COMMUNITY): Payer: Medicaid Other

## 2022-04-16 DIAGNOSIS — R569 Unspecified convulsions: Secondary | ICD-10-CM | POA: Diagnosis not present

## 2022-04-16 LAB — GLUCOSE, CAPILLARY
Glucose-Capillary: 99 mg/dL (ref 70–99)
Glucose-Capillary: 93 mg/dL (ref 70–99)
Glucose-Capillary: 94 mg/dL (ref 70–99)
Glucose-Capillary: 126 mg/dL — ABNORMAL HIGH (ref 70–99)
Glucose-Capillary: 137 mg/dL — ABNORMAL HIGH (ref 70–99)
Glucose-Capillary: 137 mg/dL — ABNORMAL HIGH (ref 70–99)

## 2022-04-16 LAB — CBC WITH DIFFERENTIAL/PLATELET
Abs Immature Granulocytes: 0.2 10*3/uL — ABNORMAL HIGH (ref 0.00–0.07)
Basophils Absolute: 0 10*3/uL (ref 0.0–0.1)
Basophils Relative: 0 %
Eosinophils Absolute: 0.3 10*3/uL (ref 0.0–0.5)
Eosinophils Relative: 6 %
HCT: 41.8 % (ref 39.0–52.0)
Hemoglobin: 14.3 g/dL (ref 13.0–17.0)
Immature Granulocytes: 4 %
Lymphocytes Relative: 17 %
Lymphs Abs: 0.9 10*3/uL (ref 0.7–4.0)
MCH: 31.3 pg (ref 26.0–34.0)
MCHC: 34.2 g/dL (ref 30.0–36.0)
MCV: 91.5 fL (ref 80.0–100.0)
Monocytes Absolute: 0.4 10*3/uL (ref 0.1–1.0)
Monocytes Relative: 8 %
Neutro Abs: 3.4 10*3/uL (ref 1.7–7.7)
Neutrophils Relative %: 65 %
Platelets: 229 10*3/uL (ref 150–400)
RBC: 4.57 MIL/uL (ref 4.22–5.81)
RDW: 12.3 % (ref 11.5–15.5)
WBC: 5.2 10*3/uL (ref 4.0–10.5)
nRBC: 0 % (ref 0.0–0.2)

## 2022-04-16 LAB — COMPREHENSIVE METABOLIC PANEL
ALT: 20 U/L (ref 0–44)
AST: 17 U/L (ref 15–41)
Albumin: 3 g/dL — ABNORMAL LOW (ref 3.5–5.0)
Alkaline Phosphatase: 60 U/L (ref 38–126)
Anion gap: 10 (ref 5–15)
BUN: 13 mg/dL (ref 6–20)
CO2: 26 mmol/L (ref 22–32)
Calcium: 8.9 mg/dL (ref 8.9–10.3)
Chloride: 103 mmol/L (ref 98–111)
Creatinine, Ser: 0.73 mg/dL (ref 0.61–1.24)
GFR, Estimated: >60 mL/min (ref >60)
Glucose, Bld: 95 mg/dL (ref 70–99)
Potassium: 4 mmol/L (ref 3.5–5.1)
Sodium: 139 mmol/L (ref 135–145)
Total Bilirubin: 0.9 mg/dL (ref 0.3–1.2)
Total Protein: 6.1 g/dL — ABNORMAL LOW (ref 6.5–8.1)

## 2022-04-16 LAB — C-REACTIVE PROTEIN: CRP: 1.4 mg/dL — ABNORMAL HIGH (ref <1.0)

## 2022-04-16 LAB — MAGNESIUM: Magnesium: 1.9 mg/dL (ref 1.7–2.4)

## 2022-04-16 LAB — PROCALCITONIN: Procalcitonin: <0.1 ng/mL

## 2022-04-16 MED ORDER — IPRATROPIUM-ALBUTEROL 0.5-2.5 (3) MG/3ML IN SOLN
3.0000 mL | RESPIRATORY_TRACT | Status: DC | PRN
Start: 2022-04-16 — End: 2022-04-21
  Administered 2022-04-16 – 2022-04-20 (×4): 3 mL via RESPIRATORY_TRACT
  Filled 2022-04-16 (×4): qty 3

## 2022-04-16 MED ORDER — GUAIFENESIN-DM 100-10 MG/5ML PO SYRP
15.0000 mL | ORAL_SOLUTION | ORAL | Status: DC | PRN
  Administered 2022-04-16 – 2022-04-18 (×8): 15 mL
  Filled 2022-04-16 (×8): qty 15

## 2022-04-16 NOTE — Progress Notes (Signed)
Patient has been eating 75-100% of meals; mom at bedside. Cont R soft wrist restraint due to patient's attempts to pull out tubes and lines when being released for ROM. Skin intact under soft wrists restraint, restraint released and ROM performed every 2 hours.  Cortrack flushed as ordered; meds administered via cortrack.

## 2022-04-16 NOTE — Progress Notes (Signed)
Pt has been tachycardic, was the same during prior shift. This triggered yellow MEWS score. Pt has congested cough, nasal congestion, bilateral rhonchi and right lower lobe crackles. On call provider informed. Pt has also been running low grade fever,  Received order to give tylenol and prn cough syrup.

## 2022-04-17 DIAGNOSIS — R569 Unspecified convulsions: Secondary | ICD-10-CM | POA: Diagnosis not present

## 2022-04-17 LAB — CBC WITH DIFFERENTIAL/PLATELET
Abs Immature Granulocytes: 0.25 10*3/uL — ABNORMAL HIGH (ref 0.00–0.07)
Basophils Absolute: 0 10*3/uL (ref 0.0–0.1)
Basophils Relative: 0 %
Eosinophils Absolute: 0.4 10*3/uL (ref 0.0–0.5)
Eosinophils Relative: 6 %
HCT: 41.7 % (ref 39.0–52.0)
Hemoglobin: 13.9 g/dL (ref 13.0–17.0)
Immature Granulocytes: 4 %
Lymphocytes Relative: 13 %
Lymphs Abs: 0.9 10*3/uL (ref 0.7–4.0)
MCH: 31.1 pg (ref 26.0–34.0)
MCHC: 33.3 g/dL (ref 30.0–36.0)
MCV: 93.3 fL (ref 80.0–100.0)
Monocytes Absolute: 1 10*3/uL (ref 0.1–1.0)
Monocytes Relative: 15 %
Neutro Abs: 4.2 10*3/uL (ref 1.7–7.7)
Neutrophils Relative %: 62 %
Platelets: 261 10*3/uL (ref 150–400)
RBC: 4.47 MIL/uL (ref 4.22–5.81)
RDW: 12.5 % (ref 11.5–15.5)
WBC: 6.8 10*3/uL (ref 4.0–10.5)
nRBC: 0 % (ref 0.0–0.2)

## 2022-04-17 LAB — BLOOD CULTURE ID PANEL (REFLEXED) - BCID2

## 2022-04-17 LAB — GLUCOSE, CAPILLARY
Glucose-Capillary: 109 mg/dL — ABNORMAL HIGH (ref 70–99)
Glucose-Capillary: 135 mg/dL — ABNORMAL HIGH (ref 70–99)
Glucose-Capillary: 129 mg/dL — ABNORMAL HIGH (ref 70–99)
Glucose-Capillary: 117 mg/dL — ABNORMAL HIGH (ref 70–99)

## 2022-04-17 LAB — COMPREHENSIVE METABOLIC PANEL
ALT: 18 U/L (ref 0–44)
AST: 16 U/L (ref 15–41)
Albumin: 3.2 g/dL — ABNORMAL LOW (ref 3.5–5.0)
Alkaline Phosphatase: 67 U/L (ref 38–126)
Anion gap: 9 (ref 5–15)
BUN: 8 mg/dL (ref 6–20)
CO2: 26 mmol/L (ref 22–32)
Calcium: 8.9 mg/dL (ref 8.9–10.3)
Chloride: 100 mmol/L (ref 98–111)
Creatinine, Ser: 0.71 mg/dL (ref 0.61–1.24)
GFR, Estimated: >60 mL/min (ref >60)
Glucose, Bld: 109 mg/dL — ABNORMAL HIGH (ref 70–99)
Potassium: 4.5 mmol/L (ref 3.5–5.1)
Sodium: 135 mmol/L (ref 135–145)
Total Bilirubin: 0.6 mg/dL (ref 0.3–1.2)
Total Protein: 6.3 g/dL — ABNORMAL LOW (ref 6.5–8.1)

## 2022-04-17 LAB — C-REACTIVE PROTEIN: CRP: 3.9 mg/dL — ABNORMAL HIGH (ref <1.0)

## 2022-04-17 LAB — MAGNESIUM: Magnesium: 2.2 mg/dL (ref 1.7–2.4)

## 2022-04-17 NOTE — Progress Notes (Signed)
Speech Language Pathology Treatment: Dysphagia  Patient Details Name: Alex [name] MRN: [id number] DOB: [birth year] Today's Date: 04/17/2022 Time: [id number] SLP Time Calculation (min) (ACUTE ONLY): 15 min  Assessment / Plan / Recommendation Clinical Impression  Mother, at bedside, reports pt doing well with dys 3 diet and thin liquids since initial SLP evaluation (04/13/22). She has been assisting  him with every meal and he appears at baseline, per her report. RN has also not seen or been reported any concerns. Of note, he presented with baseline congested cough prior to POs. Assisted pt with x3 bites of mechanical soft solids (fruit cup) and x2 sips of thin by straw. Attempted further trials with liquids, however pt turned head away with mother stating he does not like water/juice. He prefers milk, but none was present in floor stock. No overt s/sx of aspiration observed with limited trials. Oral clearance of solids was adequate. Recommend continue dys 3 diet/thin liquids. Given clinical presentation and family reports, no further SLP f/u warranted at this time. Will s/o.   HPI HPI: Pt is a 26 y.o. male who presented to [facility] ED after a prolonged seizure for about 45 minutes.  He was snoring and postictal on arrival, remained unresponsive 2 hours later. He was intubated for airway protection (6/19- 6/22). CT head (6/19) neg. CXR (6/19) noted "Increased markings are seen in the left lower lung fields suggesting atelectasis/pneumonia". Previous BSE (06/04/21) revealed primary cognitive-based dysphagia, with dys 1/thin liquids recommended at that time.  PMH: TBI and seizures and prior status epilepticus.      SLP Plan  Discharge SLP treatment due to (comment);All goals met      Recommendations for follow up therapy are one component of a multi-disciplinary discharge planning process, led by the attending physician.  Recommendations may be updated based on patient status, additional functional  criteria and insurance authorization.    Recommendations  Diet recommendations: Dysphagia 3 (mechanical soft);Thin liquid Liquids provided via: Straw;Cup Medication Administration: Crushed with puree (or however is tolerated/medically necessary) Supervision: Full supervision/cueing for compensatory strategies;Trained caregiver to feed patient Compensations: Minimize environmental distractions;Slow rate;Small sips/bites Postural Changes and/or Swallow Maneuvers: Seated upright 90 degrees                Oral Care Recommendations: Oral care BID Follow Up Recommendations: Other (comment) (SLP f/u at SNF as needed for diet advancement if desired) Assistance recommended at discharge: Frequent or constant Supervision/Assistance SLP Visit Diagnosis: Dysphagia, unspecified (R13.10) Plan: Discharge SLP treatment due to (comment);All goals met           Alex Echevaria, MA, CCC-SLP Acute Rehabilitation Services Office Number: [phone]   Alex [name]  04/17/2022, 10:01 AM

## 2022-04-18 DIAGNOSIS — R569 Unspecified convulsions: Secondary | ICD-10-CM | POA: Diagnosis not present

## 2022-04-18 LAB — COMPREHENSIVE METABOLIC PANEL
ALT: 20 U/L (ref 0–44)
AST: 19 U/L (ref 15–41)
Albumin: 3.3 g/dL — ABNORMAL LOW (ref 3.5–5.0)
Alkaline Phosphatase: 61 U/L (ref 38–126)
Anion gap: 15 (ref 5–15)
BUN: 13 mg/dL (ref 6–20)
CO2: 27 mmol/L (ref 22–32)
Calcium: 9.3 mg/dL (ref 8.9–10.3)
Chloride: 99 mmol/L (ref 98–111)
Creatinine, Ser: 0.8 mg/dL (ref 0.61–1.24)
GFR, Estimated: >60 mL/min (ref >60)
Glucose, Bld: 103 mg/dL — ABNORMAL HIGH (ref 70–99)
Potassium: 4.3 mmol/L (ref 3.5–5.1)
Sodium: 141 mmol/L (ref 135–145)
Total Bilirubin: 0.7 mg/dL (ref 0.3–1.2)
Total Protein: 6.6 g/dL (ref 6.5–8.1)

## 2022-04-18 LAB — CBC WITH DIFFERENTIAL/PLATELET
Abs Immature Granulocytes: 0.26 10*3/uL — ABNORMAL HIGH (ref 0.00–0.07)
Basophils Absolute: 0.1 10*3/uL (ref 0.0–0.1)
Basophils Relative: 1 %
Eosinophils Absolute: 0.5 10*3/uL (ref 0.0–0.5)
Eosinophils Relative: 9 %
HCT: 45.1 % (ref 39.0–52.0)
Hemoglobin: 15.1 g/dL (ref 13.0–17.0)
Immature Granulocytes: 5 %
Lymphocytes Relative: 26 %
Lymphs Abs: 1.4 10*3/uL (ref 0.7–4.0)
MCH: 31.2 pg (ref 26.0–34.0)
MCHC: 33.5 g/dL (ref 30.0–36.0)
MCV: 93.2 fL (ref 80.0–100.0)
Monocytes Absolute: 0.7 10*3/uL (ref 0.1–1.0)
Monocytes Relative: 12 %
Neutro Abs: 2.6 10*3/uL (ref 1.7–7.7)
Neutrophils Relative %: 47 %
Platelets: 259 10*3/uL (ref 150–400)
RBC: 4.84 MIL/uL (ref 4.22–5.81)
RDW: 12.5 % (ref 11.5–15.5)
WBC: 5.5 10*3/uL (ref 4.0–10.5)
nRBC: 0 % (ref 0.0–0.2)

## 2022-04-18 LAB — VALPROIC ACID LEVEL: Valproic Acid Lvl: 82 ug/mL (ref 50.0–100.0)

## 2022-04-18 LAB — GLUCOSE, CAPILLARY
Glucose-Capillary: 97 mg/dL (ref 70–99)
Glucose-Capillary: 120 mg/dL — ABNORMAL HIGH (ref 70–99)
Glucose-Capillary: 127 mg/dL — ABNORMAL HIGH (ref 70–99)
Glucose-Capillary: 129 mg/dL — ABNORMAL HIGH (ref 70–99)
Glucose-Capillary: 136 mg/dL — ABNORMAL HIGH (ref 70–99)

## 2022-04-18 LAB — C-REACTIVE PROTEIN: CRP: 2.8 mg/dL — ABNORMAL HIGH (ref <1.0)

## 2022-04-18 LAB — MAGNESIUM: Magnesium: 1.9 mg/dL (ref 1.7–2.4)

## 2022-04-18 MED ORDER — PANTOPRAZOLE SODIUM 40 MG PO TBEC
40.0000 mg | DELAYED_RELEASE_TABLET | Freq: Every day | ORAL | Status: DC
  Administered 2022-04-19 – 2022-04-20 (×2): 40 mg via ORAL
  Filled 2022-04-18 (×2): qty 1

## 2022-04-18 MED ORDER — BACLOFEN 10 MG PO TABS
10.0000 mg | ORAL_TABLET | Freq: Three times a day (TID) | ORAL | Status: DC
  Administered 2022-04-18 – 2022-04-20 (×7): 10 mg via ORAL
  Filled 2022-04-18 (×7): qty 1

## 2022-04-18 MED ORDER — DOCUSATE SODIUM 50 MG/5ML PO LIQD: 100.0000 mg | Freq: Two times a day (BID) | ORAL | Status: DC | PRN

## 2022-04-18 MED ORDER — ACETAMINOPHEN 325 MG PO TABS: 650.0000 mg | ORAL_TABLET | Freq: Four times a day (QID) | ORAL | Status: DC | PRN

## 2022-04-18 MED ORDER — LACOSAMIDE 50 MG PO TABS
100.0000 mg | ORAL_TABLET | Freq: Two times a day (BID) | ORAL | Status: DC
  Administered 2022-04-18 – 2022-04-20 (×4): 100 mg via ORAL
  Filled 2022-04-18 (×4): qty 2

## 2022-04-18 MED ORDER — GABAPENTIN 400 MG PO CAPS
400.0000 mg | ORAL_CAPSULE | Freq: Three times a day (TID) | ORAL | Status: DC
  Administered 2022-04-18 – 2022-04-20 (×7): 400 mg via ORAL
  Filled 2022-04-18 (×7): qty 1

## 2022-04-18 MED ORDER — POLYETHYLENE GLYCOL 3350 17 G PO PACK: 17.0000 g | PACK | Freq: Every day | ORAL | Status: DC | PRN

## 2022-04-18 MED ORDER — DIVALPROEX SODIUM 250 MG PO DR TAB
1000.0000 mg | DELAYED_RELEASE_TABLET | Freq: Two times a day (BID) | ORAL | Status: DC
Start: 2022-04-18 — End: 2022-04-19
  Administered 2022-04-18: 1000 mg via ORAL
  Filled 2022-04-18 (×2): qty 4

## 2022-04-18 MED ORDER — GUAIFENESIN-DM 100-10 MG/5ML PO SYRP
15.0000 mL | ORAL_SOLUTION | ORAL | Status: DC | PRN
  Administered 2022-04-20: 15 mL via ORAL
  Filled 2022-04-18: qty 15

## 2022-04-18 NOTE — Plan of Care (Signed)
  Problem: Coping: Goal: Ability to adjust to condition or change in health will improve Outcome: Progressing   Problem: Fluid Volume: Goal: Ability to maintain a balanced intake and output will improve Outcome: Progressing

## 2022-04-19 DIAGNOSIS — R569 Unspecified convulsions: Secondary | ICD-10-CM | POA: Diagnosis not present

## 2022-04-19 LAB — CULTURE, BLOOD (ROUTINE X 2)

## 2022-04-19 LAB — GLUCOSE, CAPILLARY: Glucose-Capillary: 109 mg/dL — ABNORMAL HIGH (ref 70–99)

## 2022-04-19 LAB — CBC WITH DIFFERENTIAL/PLATELET
Abs Immature Granulocytes: 0.2 10*3/uL — ABNORMAL HIGH (ref 0.00–0.07)
Basophils Absolute: 0 10*3/uL (ref 0.0–0.1)
Basophils Relative: 1 %
Eosinophils Absolute: 0.5 10*3/uL (ref 0.0–0.5)
Eosinophils Relative: 8 %
HCT: 43.3 % (ref 39.0–52.0)
Hemoglobin: 14.2 g/dL (ref 13.0–17.0)
Immature Granulocytes: 3 %
Lymphocytes Relative: 28 %
Lymphs Abs: 1.8 10*3/uL (ref 0.7–4.0)
MCH: 30.9 pg (ref 26.0–34.0)
MCHC: 32.8 g/dL (ref 30.0–36.0)
MCV: 94.1 fL (ref 80.0–100.0)
Monocytes Absolute: 0.6 10*3/uL (ref 0.1–1.0)
Monocytes Relative: 9 %
Neutro Abs: 3.1 10*3/uL (ref 1.7–7.7)
Neutrophils Relative %: 51 %
Platelets: 281 10*3/uL (ref 150–400)
RBC: 4.6 MIL/uL (ref 4.22–5.81)
RDW: 12.5 % (ref 11.5–15.5)
WBC: 6.2 10*3/uL (ref 4.0–10.5)
nRBC: 0 % (ref 0.0–0.2)

## 2022-04-19 LAB — COMPREHENSIVE METABOLIC PANEL
ALT: 20 U/L (ref 0–44)
AST: 19 U/L (ref 15–41)
Albumin: 3.2 g/dL — ABNORMAL LOW (ref 3.5–5.0)
Alkaline Phosphatase: 67 U/L (ref 38–126)
Anion gap: 16 — ABNORMAL HIGH (ref 5–15)
BUN: 15 mg/dL (ref 6–20)
CO2: 23 mmol/L (ref 22–32)
Calcium: 9.2 mg/dL (ref 8.9–10.3)
Chloride: 100 mmol/L (ref 98–111)
Creatinine, Ser: 0.78 mg/dL (ref 0.61–1.24)
GFR, Estimated: >60 mL/min (ref >60)
Glucose, Bld: 111 mg/dL — ABNORMAL HIGH (ref 70–99)
Potassium: 4.4 mmol/L (ref 3.5–5.1)
Sodium: 139 mmol/L (ref 135–145)
Total Bilirubin: 0.7 mg/dL (ref 0.3–1.2)
Total Protein: 6.2 g/dL — ABNORMAL LOW (ref 6.5–8.1)

## 2022-04-19 MED ORDER — VALPROIC ACID 250 MG PO CAPS
1000.0000 mg | ORAL_CAPSULE | Freq: Two times a day (BID) | ORAL | Status: DC
  Administered 2022-04-19 – 2022-04-20 (×3): 1000 mg via ORAL
  Filled 2022-04-19 (×4): qty 4

## 2022-04-19 NOTE — Plan of Care (Signed)
  Problem: Education: Goal: Ability to describe self-care measures that may prevent or decrease complications (Diabetes Survival Skills Education) will improve Outcome: Not Applicable Goal: Individualized Educational Video(s) Outcome: Not Applicable   Problem: Coping: Goal: Ability to adjust to condition or change in health will improve Outcome: Progressing   Problem: Fluid Volume: Goal: Ability to maintain a balanced intake and output will improve Outcome: Progressing   Problem: Health Behavior/Discharge Planning: Goal: Ability to identify and utilize available resources and services will improve Outcome: Progressing

## 2022-04-19 NOTE — Progress Notes (Signed)
PROGRESS NOTE    Alex [name]  NWG:[id number] DOB: 07-05-1996 DO: 04/10/2022  PCP: lmetta Lovely, [facility]    Brief Narrative:  This 26 years old male with history of TBI and seizures and prior status epilepticus presented in the ED at [facility] after prolonged seizure episode that lasted about 45 minutes, received 7.5 mg of Versed.  He was noted to be snoring and postictal on arrival but remained unresponsive , 2 hours later on neuro consultation with episode of left eye deviation.  He was loaded with Keppra, intubated for airway protection and transferred to [facility] for long-term EEG monitoring due to suspicion for subclinical seizures. CT head shows multifocal encephalomalacia in bilateral frontal and temporal lobes and EEG shows diffuse slowing with no epileptiform activity.  Successfully extubated.  TRH pickup 04/15/2022. He is overall doing better, waiting discharge to SNF.  ssessment & Plan:   Principal Problem:   Seizure (HCC) ctive Problems:   Traumatic brain injury (HCC)   cute respiratory failure with hypoxia (HCC)   Seizure disorder with status epilepticus: Patient presented with status epilepticus, started on antiepileptic drugs. No further seizures on EEG, remained postictal. Now off Versed, successfully extubated. Neurology is following, adjusted antiseizure medications Continue Depakote 1000 mg twice daily. Continue lacosamide 100 mg twice daily Continue gabapentin 400 mg 3 times daily. Keppra discontinued. Neurology recommended presurgical evaluation - deferred to primary epileptologist  Traumatic brain injury: Patient does have generalized contractures but awake,  not following commands Continue baclofen, hold Seroquel at this time. Speech recommended dysphagia 3 thin liquids. Patient is taking medications in applesauce. Cortrak d/c'd  cute hypoxic respiratory failure: Which is now resolved after successful extubation.  Fever: Patient spiked fever 100.6  on 6/25, mother reports cough CXR: Unremarkable.  febrile since, no leukocytosis If recurrent, will follow U as well Blood cultures 1 of 2 staph species likely contaminant Continue cough medications, breathing treatment. Hold on antibiotics for now.  Remain afebrile.  DVT prophylaxis: Lovenox Code Status: Full code Family Communication:  Mother at bed side. Disposition Plan:   Status is: Inpatient Remains inpatient appropriate because:   dmitted for status epilepticus, intubated and continued on antiseizure medications.  Now successfully extubated, TRH pickup 04/15/2022.  Patient is comparatively doing better.  nticipated discharge to SNF in 1 to 2 days.   Consultants:  Neurology PCCM  Procedures:  CT head, EEG  ntimicrobials:   nti-infectives (From admission, onward)    None       Subjective: Difficult to understand, does try to answer some questions   Objective: Vitals:   04/19/22 0741 04/19/22 1142 04/19/22 1501 04/19/22 1934  BP: (!) 98/57 113/75 119/84 137/77  Pulse: 99 (!) 101 100 (!) 105  Resp: 18 16 18 18   Temp: 98.4 F (36.9 C) 98 F (36.7 C) 98.3 F (36.8 C) 99.2 F (37.3 C)  TempSrc: xillary Oral Oral Oral  SpO2: 94% 95% 98% 95%  Weight:      Height:        Intake/Output Summary (Last 24 hours) at 04/19/2022 2035 Last data filed at 04/19/2022 1450 Gross per 24 hour  Intake --  Output 300 ml  Net -300 ml   Filed Weights   04/17/22 0600 04/18/22 0651 04/19/22 0500  Weight: 73.4 kg 72.4 kg 75.1 kg    Examination:  General: No acute distress.  Limited exam, he swung at me as I tried to examine him. Lungs: unlabored Neurological: confused, tried to hit me when I examined him  Skin: No visible rashes or lesions. Extremities: No clubbing or cyanosis. No edema.    Data Reviewed: I have personally reviewed following labs and imaging studies  CBC: Recent Labs  Lab 04/15/22 0234 04/16/22 0304 04/17/22 0102 04/18/22 0431  04/19/22 0325  WBC 5.0 5.2 6.8 5.5 6.2  NEUTROABS 2.4 3.4 4.2 2.6 3.1  HGB 15.5 14.3 13.9 15.1 14.2  HCT 47.0 41.8 41.7 45.1 43.3  MCV 91.6 91.5 93.3 93.2 94.1  PLT 286 229 261 259 281   Basic Metabolic Panel: Recent Labs  Lab 04/15/22 0234 04/16/22 0304 04/17/22 0102 04/18/22 0431 04/19/22 0325  NA 139 139 135 141 139  K 4.4 4.0 4.5 4.3 4.4  CL 103 103 100 99 100  CO2 25 26 26 27 23   GLUCOSE 100* 95 109* 103* 111*  BUN 11 13 8 13 15   CREATININE 0.70 0.73 0.71 0.80 0.78  CALCIUM 9.4 8.9 8.9 9.3 9.2  MG 2.0 1.9 2.2 1.9  --    GFR: Estimated Creatinine Clearance: 139.9 mL/min (by C-G formula based on SCr of 0.78 mg/dL). Liver Function Tests: Recent Labs  Lab 04/15/22 0234 04/16/22 0304 04/17/22 0102 04/18/22 0431 04/19/22 0325  AST 20 17 16 19 19   ALT 20 20 18 20 20   ALKPHOS 69 60 67 61 67  BILITOT 0.6 0.9 0.6 0.7 0.7  PROT 6.6 6.1* 6.3* 6.6 6.2*  ALBUMIN 3.4* 3.0* 3.2* 3.3* 3.2*   No results for input(s): "LIPASE", "AMYLASE" in the last 168 hours. No results for input(s): "AMMONIA" in the last 168 hours. Coagulation Profile: No results for input(s): "INR", "PROTIME" in the last 168 hours.  Cardiac Enzymes: No results for input(s): "CKTOTAL", "CKMB", "CKMBINDEX", "TROPONINI" in the last 168 hours. BNP (last 3 results) No results for input(s): "PROBNP" in the last 8760 hours. HbA1C: No results for input(s): "HGBA1C" in the last 72 hours. CBG: Recent Labs  Lab 04/18/22 1223 04/18/22 1558 04/18/22 1932 04/18/22 2325 04/19/22 0337  GLUCAP 120* 127* 129* 136* 109*   Lipid Profile: No results for input(s): "CHOL", "HDL", "LDLCALC", "TRIG", "CHOLHDL", "LDLDIRECT" in the last 72 hours. Thyroid Function Tests: No results for input(s): "TSH", "T4TOTAL", "FREET4", "T3FREE", "THYROIDAB" in the last 72 hours. Anemia Panel: No results for input(s): "VITAMINB12", "FOLATE", "FERRITIN", "TIBC", "IRON", "RETICCTPCT" in the last 72 hours. Sepsis Labs: Recent Labs   Lab 04/15/22 0234 04/16/22 0304  PROCALCITON <0.10 <0.10    Recent Results (from the past 240 hour(s))  Urine Culture     Status: None   Collection Time: 04/10/22  1:17 PM   Specimen: In/Out Cath Urine  Result Value Ref Range Status   Specimen Description   Final    IN/OUT CATH URINE Performed at [facility], [address] [place], 04/12/22 [facility]    Special Requests   Final    NONE Performed at [facility], [address], [place], [address] [facility]    Culture   Final    NO GROWTH Performed at [facility], [address] [place], [address] [facility]    Report Status 04/11/2022 FINAL  Final  Blood Culture (routine x 2)     Status: None   Collection Time: 04/10/22  1:18 PM   Specimen: BLOOD  Result Value Ref Range Status   Specimen Description BLOOD RIGHT ANTECUBITAL  Final   Special Requests   Final    BOTTLES DRAWN AEROBIC AND ANAEROBIC Blood Culture results may not be optimal due to an inadequate  volume of blood received in culture bottles   Culture   Final    NO GROWTH 5 DAYS Performed at [facility], [address] [place], [place] [address]    Report Status 04/15/2022 FINAL  Final  Blood Culture (routine x 2)     Status: None   Collection Time: 04/10/22  1:18 PM   Specimen: BLOOD  Result Value Ref Range Status   Specimen Description BLOOD BLOOD LEFT HAND  Final   Special Requests   Final    BOTTLES DRAWN AEROBIC AND ANAEROBIC Blood Culture results may not be optimal due to an inadequate volume of blood received in culture bottles   Culture   Final    NO GROWTH 5 DAYS Performed at [facility], [address] [place], [place] [address]    Report Status 04/15/2022 FINAL  Final  SARS Coronavirus 2 by RT PCR (hospital order, performed in [facility] hospital lab) *cepheid single result test* Anterior Nasal Swab     Status: None   Collection Time: 04/10/22  1:54 PM   Specimen: Anterior Nasal Swab  Result Value  Ref Range Status   SARS Coronavirus 2 by RT PCR NEGATIVE NEGATIVE Final    Comment: (NOTE) SARS-CoV-2 target nucleic acids are NOT DETECTED.  The SARS-CoV-2 RNA is generally detectable in upper and lower respiratory specimens during the acute phase of infection. The lowest concentration of SARS-CoV-2 viral copies this assay can detect is 250 copies / mL. Calieb Lichtman negative result does not preclude SARS-CoV-2 infection and should not be used as the sole basis for treatment or other patient management decisions.  Gillie Crisci negative result may occur with improper specimen collection / handling, submission of specimen other than nasopharyngeal swab, presence of viral mutation(s) within the areas targeted by this assay, and inadequate number of viral copies (<250 copies / mL). Omega Slager negative result must be combined with clinical observations, patient history, and epidemiological information.  Fact Sheet for Patients:   RoadLapTop.co.za  Fact Sheet for Healthcare Providers: http://kim-miller.com/  This test is not yet approved or  cleared by the [place] FDA and has been authorized for detection and/or diagnosis of SARS-CoV-2 by FDA under an Emergency Use Authorization (EUA).  This EUA will remain in effect (meaning this test can be used) for the duration of the COVID-19 declaration under Section 564(b)(1) of the Act, 21 U.S.C. section 360bbb-3(b)(1), unless the authorization is terminated or revoked sooner.  Performed at [facility], [address] [place], [place] [address]   MRSA Next Gen by PCR, Nasal     Status: Abnormal   Collection Time: 04/10/22 11:24 PM   Specimen: Nasal Mucosa; Nasal Swab  Result Value Ref Range Status   MRSA by PCR Next Gen DETECTED (Danaya Geddis) NOT DETECTED Final    Comment: RESULT CALLED TO, READ BACK BY AND VERIFIED WITH: RN CHRIS WOODARD 04/11/22@00 :59 BY TW (NOTE) The GeneXpert MRSA Assay (FDA approved for NASAL  specimens only), is one component of Chicquita Mendel comprehensive MRSA colonization surveillance program. It is not intended to diagnose MRSA infection nor to guide or monitor treatment for MRSA infections. Test performance is not FDA approved in patients less than 76 years old. Performed at [facility], [address], [place], [place] [address]   Culture, blood (Routine X 2) w Reflex to ID Panel     Status: Abnormal   Collection Time: 04/16/22  2:55 PM   Specimen: BLOOD  Result Value Ref Range Status  Specimen Description BLOOD SITE NOT SPECIFIED  Final   Special Requests   Final    BOTTLES DRAWN AEROBIC ONLY Blood Culture results may not be optimal due to an inadequate volume of blood received in culture bottles IMMUNOCOMPROMISED   Culture  Setup Time   Final    GRAM POSITIVE COCCI IN CLUSTERS AEROBIC BOTTLE ONLY CRITICAL RESULT CALLED TO, READ BACK BY AND VERIFIED WITH: PHARMD FRANK WILSON ON 04/17/22 @ 1728 BY DRT    Culture (Dorian Renfro)  Final    STAPHYLOCOCCUS CAPITIS THE SIGNIFICANCE OF ISOLATING THIS ORGANISM FROM Tennyson Wacha SINGLE SET OF BLOOD CULTURES WHEN MULTIPLE SETS ARE DRAWN IS UNCERTAIN. PLEASE NOTIFY THE MICROBIOLOGY DEPARTMENT WITHIN ONE WEEK IF SPECIATION AND SENSITIVITIES ARE REQUIRED. Performed at [facility], [address] [place], [place] [address]    Report Status 04/19/2022 FINAL  Final  Culture, blood (Routine X 2) w Reflex to ID Panel     Status: None (Preliminary result)   Collection Time: 04/16/22  2:55 PM   Specimen: BLOOD  Result Value Ref Range Status   Specimen Description BLOOD SITE NOT SPECIFIED  Final   Special Requests   Final    BOTTLES DRAWN AEROBIC ONLY Blood Culture results may not be optimal due to an inadequate volume of blood received in culture bottles IMMUNOCOMPROMISED   Culture   Final    NO GROWTH 3 DAYS Performed at [facility], [address] [place], [place] [address]    Report Status PENDING  Incomplete  Blood Culture ID Panel  (Reflexed)     Status: Abnormal   Collection Time: 04/16/22  2:55 PM  Result Value Ref Range Status   Enterococcus faecalis NOT DETECTED NOT DETECTED Final   Enterococcus Faecium NOT DETECTED NOT DETECTED Final   Listeria monocytogenes NOT DETECTED NOT DETECTED Final   Staphylococcus species DETECTED (Laconda Basich) NOT DETECTED Final    Comment: CRITICAL RESULT CALLED TO, READ BACK BY AND VERIFIED WITH: PHARMD FRANK WILSON ON 04/17/22 @ 1728 BY DRT    Staphylococcus aureus (BCID) NOT DETECTED NOT DETECTED Final   Staphylococcus epidermidis NOT DETECTED NOT DETECTED Final   Staphylococcus lugdunensis NOT DETECTED NOT DETECTED Final   Streptococcus species NOT DETECTED NOT DETECTED Final   Streptococcus agalactiae NOT DETECTED NOT DETECTED Final   Streptococcus pneumoniae NOT DETECTED NOT DETECTED Final   Streptococcus pyogenes NOT DETECTED NOT DETECTED Final   Gwendola Hornaday.calcoaceticus-baumannii NOT DETECTED NOT DETECTED Final   Bacteroides fragilis NOT DETECTED NOT DETECTED Final   Enterobacterales NOT DETECTED NOT DETECTED Final   Enterobacter cloacae complex NOT DETECTED NOT DETECTED Final   Escherichia coli NOT DETECTED NOT DETECTED Final   Klebsiella aerogenes NOT DETECTED NOT DETECTED Final   Klebsiella oxytoca NOT DETECTED NOT DETECTED Final   Klebsiella pneumoniae NOT DETECTED NOT DETECTED Final   Proteus species NOT DETECTED NOT DETECTED Final   Salmonella species NOT DETECTED NOT DETECTED Final   Serratia marcescens NOT DETECTED NOT DETECTED Final   Haemophilus influenzae NOT DETECTED NOT DETECTED Final   Neisseria meningitidis NOT DETECTED NOT DETECTED Final   Pseudomonas aeruginosa NOT DETECTED NOT DETECTED Final   Stenotrophomonas maltophilia NOT DETECTED NOT DETECTED Final   Candida albicans NOT DETECTED NOT DETECTED Final   Candida auris NOT DETECTED NOT DETECTED Final   Candida glabrata NOT DETECTED NOT DETECTED Final   Candida krusei NOT DETECTED NOT DETECTED Final   Candida  parapsilosis NOT DETECTED NOT DETECTED Final   Candida tropicalis NOT DETECTED NOT  DETECTED Final   Cryptococcus neoformans/gattii NOT DETECTED NOT DETECTED Final    Comment: Performed at [facility], [address], [place], [place] [address]    Radiology Studies: No results found.  Scheduled Meds:  baclofen  10 mg Oral TID   gabapentin  400 mg Oral TID   heparin  5,000 Units Subcutaneous Q8H   lacosamide  100 mg Oral BID   LORazepam  0.5 mg Oral QHS   mouth rinse  15 mL Mouth Rinse 4 times per day   pantoprazole  40 mg Oral Q1200   valproic acid  1,000 mg Oral BID   Continuous Infusions:     LOS: 9 days    Time spent: 35 mins    Lacretia Nicks, MD [facility]   If 7PM-7AM, please contact night-coverage

## 2022-04-20 ENCOUNTER — Encounter (HOSPITAL_COMMUNITY): Payer: Self-pay | Admitting: Pulmonary Disease

## 2022-04-20 DIAGNOSIS — R509 Fever, unspecified: Secondary | ICD-10-CM

## 2022-04-20 DIAGNOSIS — R569 Unspecified convulsions: Secondary | ICD-10-CM | POA: Diagnosis not present

## 2022-04-20 LAB — COMPREHENSIVE METABOLIC PANEL
ALT: 26 U/L (ref 0–44)
AST: 22 U/L (ref 15–41)
Albumin: 3.2 g/dL — ABNORMAL LOW (ref 3.5–5.0)
Alkaline Phosphatase: 72 U/L (ref 38–126)
Anion gap: 10 (ref 5–15)
BUN: 15 mg/dL (ref 6–20)
CO2: 27 mmol/L (ref 22–32)
Calcium: 9.2 mg/dL (ref 8.9–10.3)
Chloride: 104 mmol/L (ref 98–111)
Creatinine, Ser: 0.82 mg/dL (ref 0.61–1.24)
GFR, Estimated: >60 mL/min (ref >60)
Glucose, Bld: 127 mg/dL — ABNORMAL HIGH (ref 70–99)
Potassium: 4.3 mmol/L (ref 3.5–5.1)
Sodium: 141 mmol/L (ref 135–145)
Total Bilirubin: 0.4 mg/dL (ref 0.3–1.2)
Total Protein: 6.7 g/dL (ref 6.5–8.1)

## 2022-04-20 LAB — CBC WITH DIFFERENTIAL/PLATELET
Abs Immature Granulocytes: 0.21 K/uL — ABNORMAL HIGH (ref 0.00–0.07)
Basophils Absolute: 0 K/uL (ref 0.0–0.1)
Basophils Relative: 1 %
Eosinophils Absolute: 0.5 K/uL (ref 0.0–0.5)
Eosinophils Relative: 10 %
HCT: 45.5 % (ref 39.0–52.0)
Hemoglobin: 15.5 g/dL (ref 13.0–17.0)
Immature Granulocytes: 4 %
Lymphocytes Relative: 26 %
Lymphs Abs: 1.4 K/uL (ref 0.7–4.0)
MCH: 31.4 pg (ref 26.0–34.0)
MCHC: 34.1 g/dL (ref 30.0–36.0)
MCV: 92.3 fL (ref 80.0–100.0)
Monocytes Absolute: 0.4 K/uL (ref 0.1–1.0)
Monocytes Relative: 7 %
Neutro Abs: 3 K/uL (ref 1.7–7.7)
Neutrophils Relative %: 52 %
Platelets: 303 K/uL (ref 150–400)
RBC: 4.93 MIL/uL (ref 4.22–5.81)
RDW: 12.5 % (ref 11.5–15.5)
WBC: 5.6 K/uL (ref 4.0–10.5)
nRBC: 0 % (ref 0.0–0.2)

## 2022-04-20 LAB — MAGNESIUM: Magnesium: 2.1 mg/dL (ref 1.7–2.4)

## 2022-04-20 LAB — PHOSPHORUS: Phosphorus: 4.1 mg/dL (ref 2.5–4.6)

## 2022-04-20 MED ORDER — VALPROIC ACID 250 MG PO CAPS: 1000.0000 mg | ORAL_CAPSULE | Freq: Two times a day (BID) | ORAL | 0 refills | Status: DC

## 2022-04-20 MED ORDER — GABAPENTIN 400 MG PO CAPS: 400.0000 mg | ORAL_CAPSULE | Freq: Three times a day (TID) | ORAL | 0 refills | Status: DC

## 2022-04-20 MED ORDER — LORAZEPAM 0.5 MG PO TABS: 0.5000 mg | ORAL_TABLET | Freq: Every day | ORAL | 0 refills | Status: AC | PRN

## 2022-04-20 MED ORDER — VALTOCO 15 MG DOSE 7.5 MG/0.1ML NA LQPK: NASAL | 0 refills | Status: DC

## 2022-04-20 MED ORDER — OSMOLITE 1.5 CAL PO LIQD: 237.0000 mL | Freq: Two times a day (BID) | ORAL | Status: AC

## 2022-04-20 MED ORDER — LACOSAMIDE 100 MG PO TABS: 100.0000 mg | ORAL_TABLET | Freq: Two times a day (BID) | ORAL | 0 refills | Status: DC

## 2022-04-20 NOTE — Progress Notes (Signed)
OT Cancellation Note  Patient Details Name: Alex [name] MRN: [id number] DOB: 13-Jun-1996   Cancelled Treatment:    Reason Eval/Treat Not Completed: OT screened, no needs identified, will sign off (Contacted by RN to address concerns for foot drop at LLE and possible need for brace. Placed PT order and discussed case with PT. Defer further therapist and orthotic needs to PT. Will sign off. Thank you.)  Marissia Blackham M Ramey Schiff Mathieu Schloemer MSOT, OTR/L Acute Rehab Office: [phone] 04/20/2022, 7:52 AM

## 2022-04-20 NOTE — Progress Notes (Signed)
Nutrition Follow-up  DOCUMENTATION CODES:  Not applicable  INTERVENTION:  Continue current diet as ordered, encourage PO intake Nursing to assist with feeding when family is not present  NUTRITION DIAGNOSIS:  Inadequate oral intake related to inability to eat as evidenced by NPO status. - progressing  GOAL:  Patient will meet greater than or equal to 90% of their needs - progressing, diet in place with good PO  MONITOR:  TF tolerance  REASON FOR ASSESSMENT:  Ventilator, Consult Enteral/tube feeding initiation and management  ASSESSMENT:  26 year old male who presented to the ED on 6/19 with seizures. Pt required intubation in the ED. PMH of TBI and seizures.  6/19 intubated 6/22 extubated; started on Dysphagia 3 diet with thin liquids  6/23 cortrak placed; tip gastric 6/27 - cortrak removed  Pt resting in bed at the time of assessment. Pt is awake, but unable to provide answers to questions due to hx TBI.   Good intake of meals documented. RN in room at the time of visit states that pt's mom has been feeding him and he is doing well. Pt stable for dc and will likely be leaving today.  No further nutrition interventions warranted at this time, good intake of meals and likely meeting needs through diet alone.    Average Meal Intake: 6/24-6/27: 100% intake x 10 recorded meals  Nutritionally Relevant Medications: Scheduled Meds:  baclofen  10 mg Oral TID   pantoprazole  40 mg Oral Q1200   PRN Meds: polyethylene glycol  Labs Reviewed  NUTRITION - FOCUSED PHYSICAL EXAM: Flowsheet Row Most Recent Value  Orbital Region No depletion  Upper Arm Region No depletion  Thoracic and Lumbar Region No depletion  Buccal Region Unable to assess  Temple Region No depletion  Clavicle Bone Region No depletion  Clavicle and Acromion Bone Region Mild depletion  Scapular Bone Region No depletion  Dorsal Hand Mild depletion  Patellar Region Moderate depletion  Anterior Thigh  Region Moderate depletion  Posterior Calf Region Moderate depletion  Edema (RD Assessment) None  Hair Reviewed  Eyes Reviewed  Mouth Reviewed  Skin Reviewed  Nails Reviewed   Diet Order:   Diet Order             DIET DYS 3 Room service appropriate? No; Fluid consistency: Thin  Diet effective now                   EDUCATION NEEDS:  No education needs have been identified at this time  Skin:  Skin Assessment: Reviewed RN Assessment (irritant dermatitis to coccyx)  Last BM:  6/28- type 6  Height:  Ht Readings from Last 1 Encounters:  04/13/22 5\' 9"  (1.753 m)    Weight:  Wt Readings from Last 1 Encounters:  04/20/22 75.1 kg    Ideal Body Weight:  72.7 kg  BMI:  Body mass index is 24.45 kg/m.  Estimated Nutritional Needs:  Kcal:  2100-2300 Protein:  105-120 grams Fluid:  >2.0 L   04/22/22, RD, LDN Clinical Dietitian RD pager # available in [facility]  After hours/weekend pager # available in [facility]

## 2022-04-20 NOTE — Progress Notes (Signed)
Report given to Trilby Leaver RN at [facility].  [facility] to transport pt.

## 2022-04-20 NOTE — Progress Notes (Signed)
PT Cancellation Note  Patient Details Name: Alex [name] MRN: [id number] DOB: 21-Sep-1996   Cancelled Treatment:    Reason Eval/Treat Not Completed: PT screened, no needs identified, will sign off  Order received for left foot ?orthosis due to tight heel cord. 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 Discussed with mother that there is not a brace that is going to reverse this contracture and she expressed understanding. Nursing has ordered a Prevalon boot for skin protection and offers mild dorsiflexion stretch/positioning. Mother educated on donning brace and she is already familiar with PROM techniques.   No further needs at this time. All questions answered.  [id number]   Jerolyn Center, PT Acute Rehabilitation Services  Office [phone]  Alex [name] 04/20/2022, 10:03 AM

## 2022-04-20 NOTE — Assessment & Plan Note (Signed)
Patient presented with status epilepticus No further seizures on EEG, remained postictal. Now off Versed, successfully extubated. Neurology is following, adjusted antiseizure medications Continue Depakote 1000 mg twice daily. Continue lacosamide 100 mg twice daily Continue gabapentin 400 mg 3 times daily. Keppra discontinued. Neurology recommended presurgical evaluation - deferred to primary epileptologist

## 2022-04-20 NOTE — Hospital Course (Signed)
26 years old male with history of TBI and seizures and prior status epilepticus presented in the ED at  [facility] after prolonged seizure episode that lasted about 45 minutes, received 7.5 mg of Versed.  He was noted to be snoring and postictal on arrival but remained unresponsive , 2 hours later on neuro consultation with episode of left eye deviation.  valproate level was subtherapeutic.  He was loaded with Keppra, intubated for airway protection and transferred to [facility] for long-term EEG monitoring due to suspicion for subclinical seizures. CT head shows multifocal encephalomalacia in bilateral frontal and temporal lobes and EEG shows diffuse slowing with no epileptiform activity.  Successfully extubated.  TRH pickup 04/15/2022. He is overall doing better, plan for discharge to SNF today with changes per neurology.  See below for additional details
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
## 2022-04-20 NOTE — TOC Transition Note (Signed)
Transition of Care [facility]) - CM/SW Discharge Note   Patient Details  Name: Alex [name] MRN: [id number] Date of Birth: August 15, 1996  Transition of Care [facility]) CM/SW Contact:  [name], LCSW Phone Number: 04/20/2022, 12:50 PM   Clinical Narrative:   CSW notified by MD that patient stable for transfer back to [facility], sent discharge information over to SNF. CSW notified APS of patient's return to SNF today, and left a voicemail for patient's mother. Transport scheduled with [facility] for next available.  Nurse to call report to [phone], Room 27A.    Final next level of care: Skilled Nursing Facility Barriers to Discharge: Barriers Resolved   Patient Goals and CMS Choice Patient states their goals for this hospitalization and ongoing recovery are:: Return to ltc CMS Medicare.gov Compare Post Acute Care list provided to:: Patient Represenative (must comment) Choice offered to / list presented to : Parent  Discharge Placement              Patient chooses bed at: [facility] Patient to be transferred to facility by: [facility] Name of family member notified: Left voicemail for [name] Patient and family notified of of transfer: 04/20/22  Discharge Plan and Services In-house Referral: Clinical Social Work   Post Acute Care Choice: Skilled Nursing Facility                               Social Determinants of Health (SDOH) Interventions     Readmission Risk Interventions     No data to display

## 2022-04-20 NOTE — Assessment & Plan Note (Addendum)
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 Speech recommended dysphagia 3 thin liquids. Patient is taking medications in applesauce. Cortrak d/c'd SLP recommended to follow at [facility]

## 2022-04-20 NOTE — Assessment & Plan Note (Signed)
Occurred on 6/25 CXR unremarkable, afebrile, no leukocytosis Blood cultures 1/2 staph capitis, suspect contaminant  Follow outpatient

## 2022-04-20 NOTE — Assessment & Plan Note (Signed)
resolved 

## 2022-04-21 LAB — CULTURE, BLOOD (ROUTINE X 2): Culture: NO GROWTH

## 2022-07-07 IMAGING — CT CT HEAD W/O CM
4 series · 16 of 47 positions shown, 18 images · non-contrast
Comparison: Head CT 11/30/2021

CLINICAL DATA: Status epilepticus, history of TBI and seizures



[Series 2: head bone · axial · 0.44mm/px · z∈[-112,-80]mm · 3 of 78 slices shown]
[im 8/78  bone]
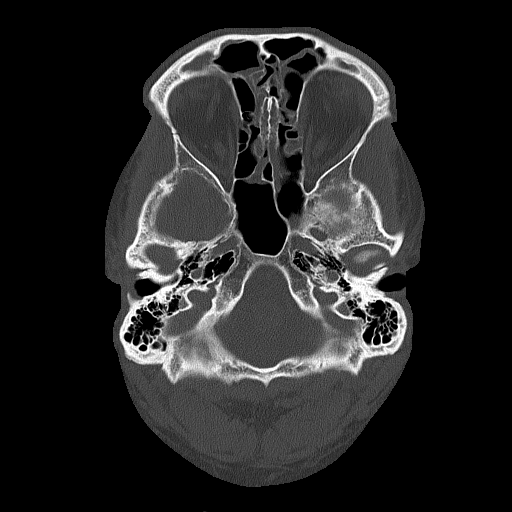
[im 16/78  bone]
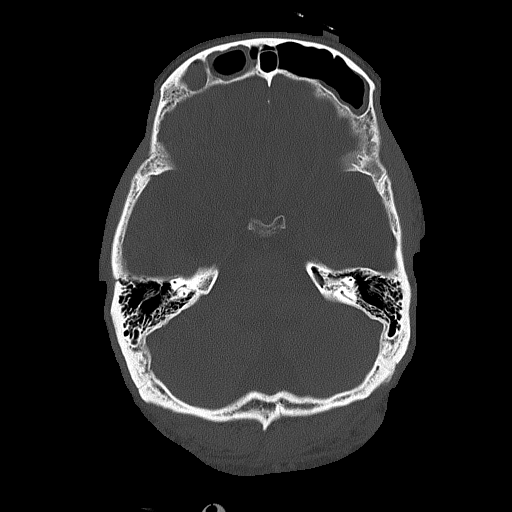
[im 24/78  bone]
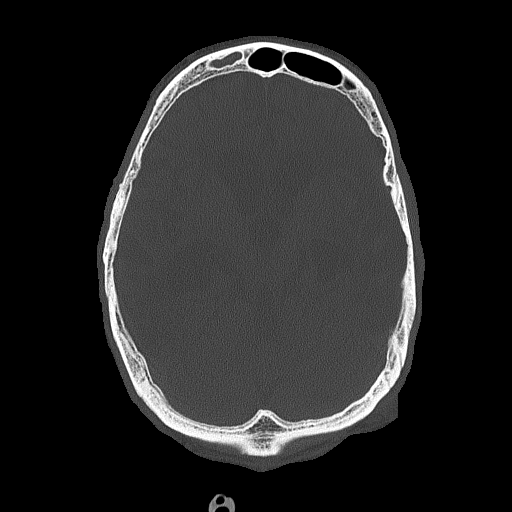

[Series 3: head wo · axial · 0.44mm/px · z∈[-111,+4]mm · 7 of 31 slices shown, 9 images]
[im 4/31  brain]
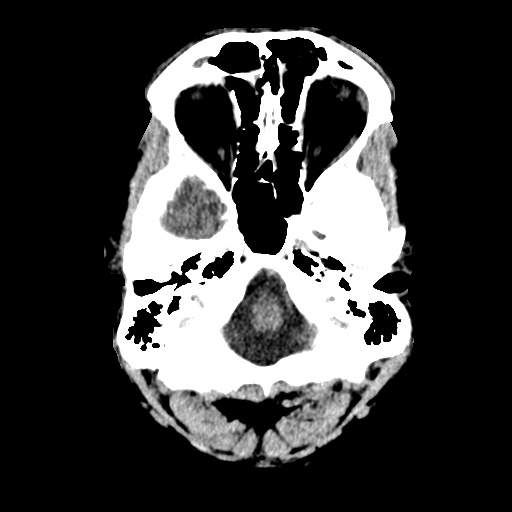
[im 4/31  bone]
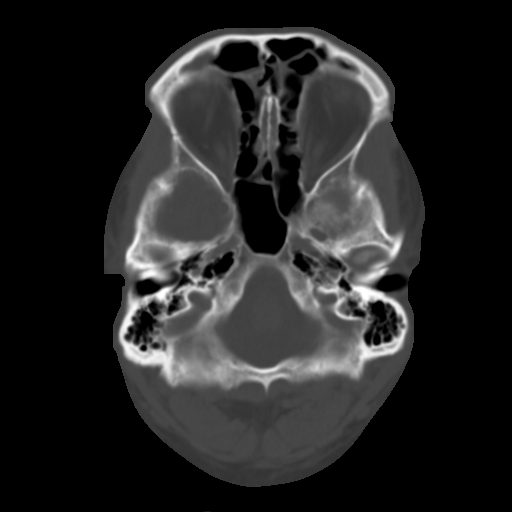
[im 8/31  brain]
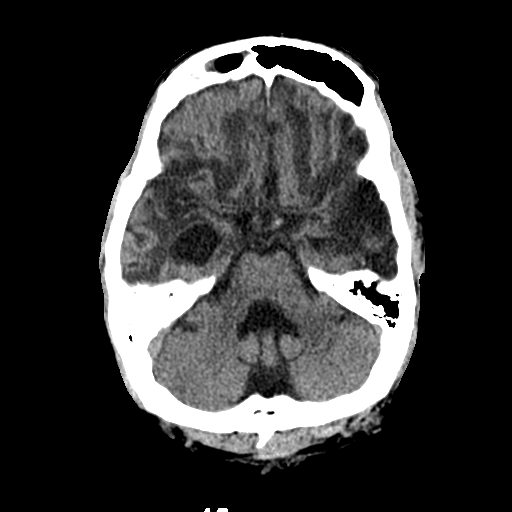
[im 12/31  brain]
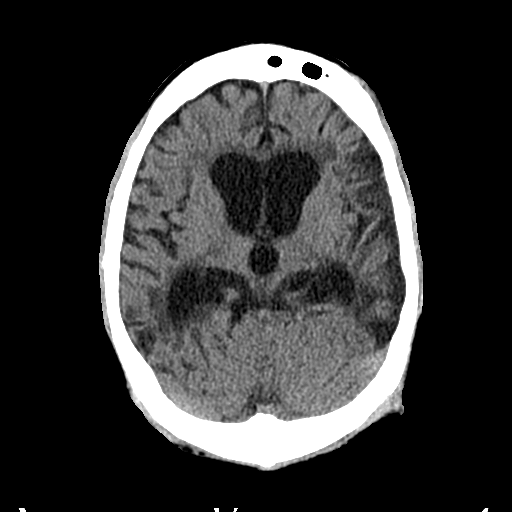
[im 16/31  brain]
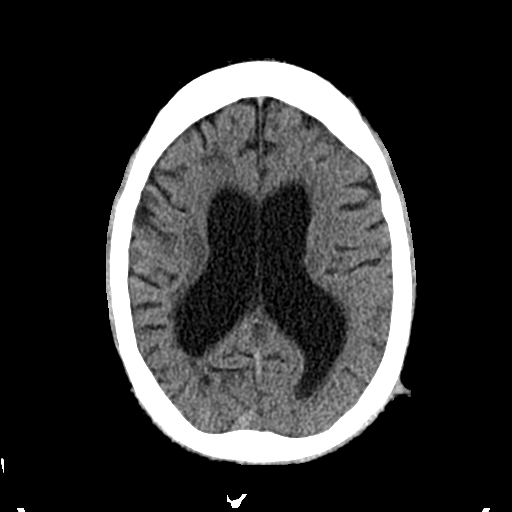
[im 19/31  brain]
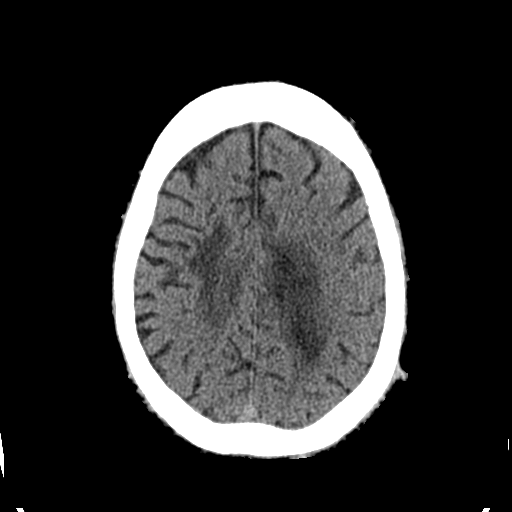
[im 19/31  bone]
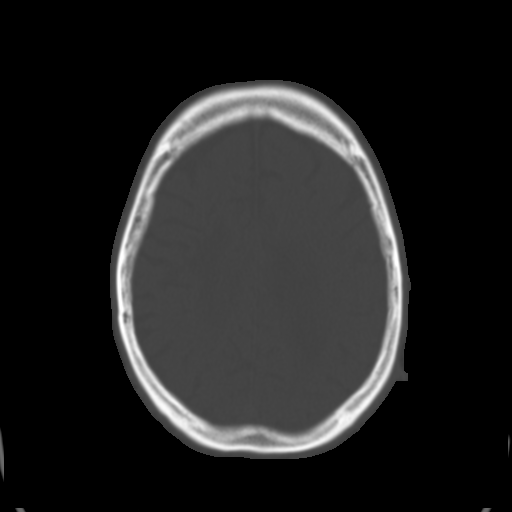
[im 23/31  brain]
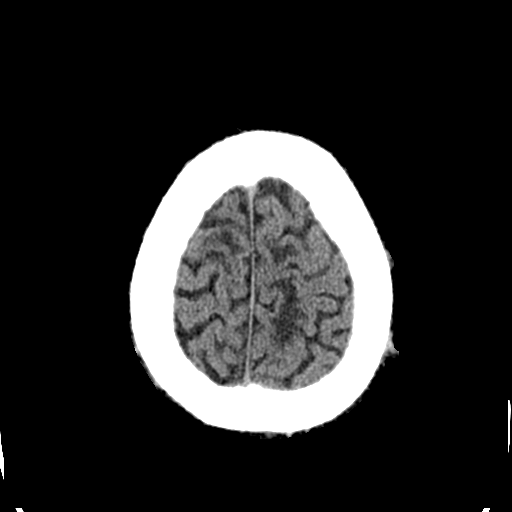
[im 27/31  brain]
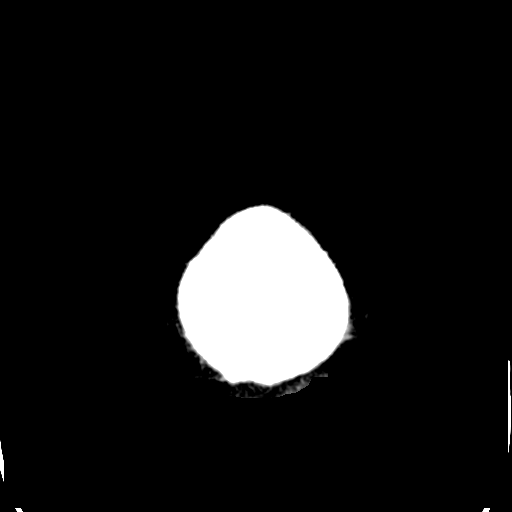

[Series 4: coronal soft tissue · coronal · 0.31mm/px · 3 of 70 slices shown]
[im 24/70  brain]
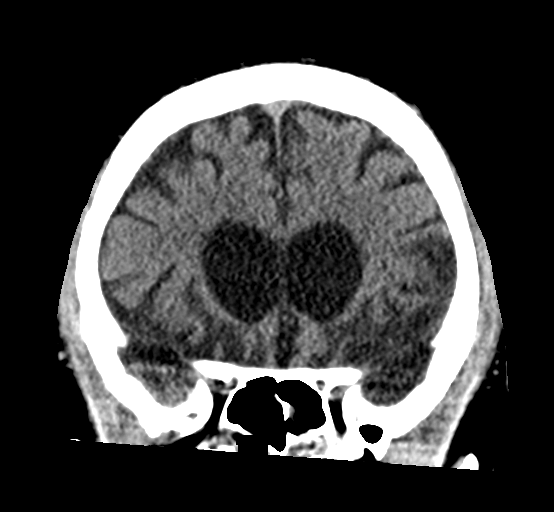
[im 31/70  brain]
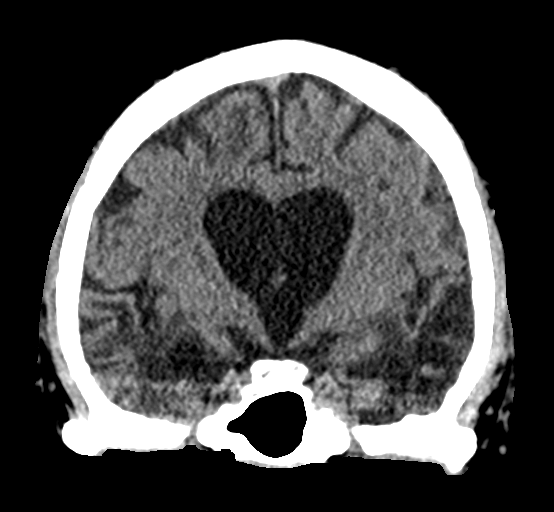
[im 39/70  brain]
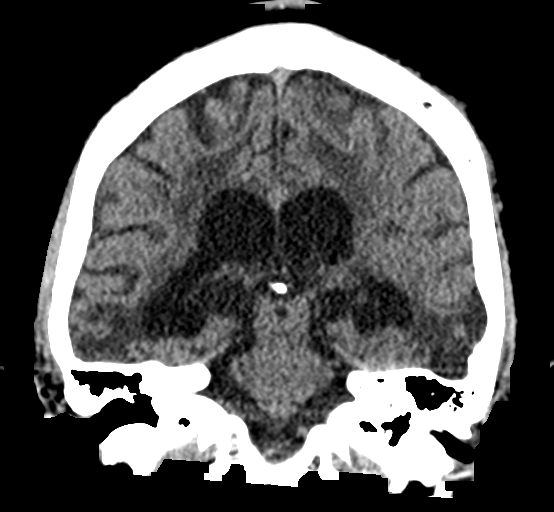

[Series 5: sagittal soft tissue · sagittal · 0.31mm/px · 3 of 54 slices shown]
[im 18/54  brain]
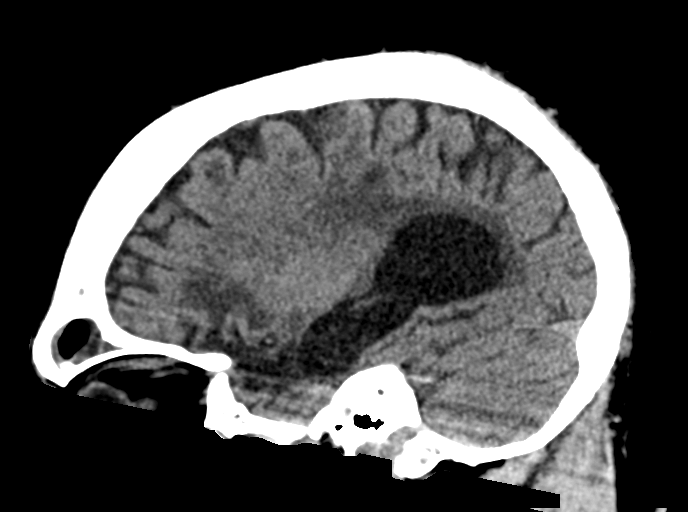
[im 27/54  brain]
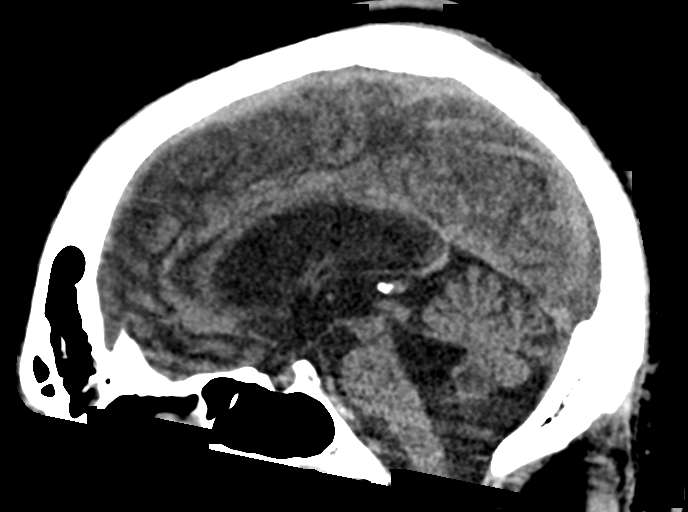
[im 36/54  brain]
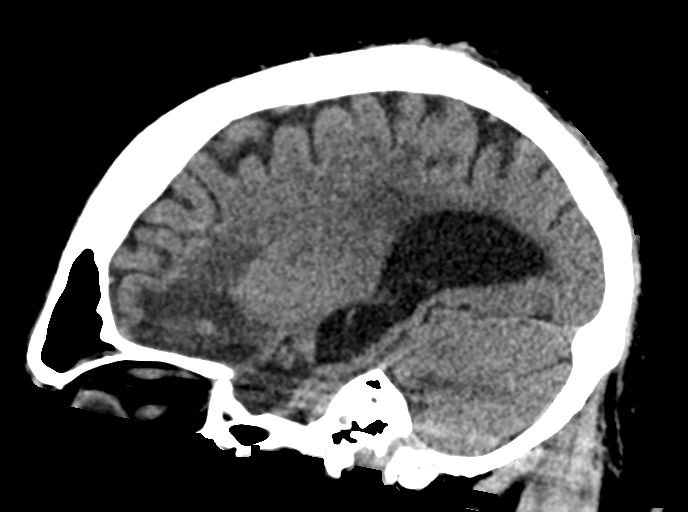

[16 of 47 positions shown; findings below may reference images not displayed]

FINDINGS: Brain: There is no evidence of acute intracranial hemorrhage,
extra-axial fluid collection, or acute infarct.

There is global parenchymal volume loss with prominence of the
ventricular system and extra-axial CSF spaces, accelerated for age
but unchanged since the prior MRI. Multifocal encephalomalacia in
the bilateral frontal and temporal lobes and left parietal lobe is
unchanged, likely reflecting sequela of prior trauma. The
ventricular system is stable compared to the prior study. There is
no mass lesion. There is no mass effect or midline shift.

Vascular: No hyperdense vessel or unexpected calcification.

Skull: Normal. Negative for fracture or focal lesion.

Sinuses/Orbits: Mucosal thickening throughout the imaged paranasal
sinuses is overall similar to the prior study. The imaged globes and
orbits are unremarkable.

Other: None.
IMPRESSION: 1. No acute intracranial pathology.
2. Stable multifocal encephalomalacia likely reflecting sequela of
prior trauma.

## 2022-07-08 IMAGING — DX DG ABDOMEN 1V
1 series · 1 of 1 positions shown · non-contrast
Comparison: May 31, 2021

CLINICAL DATA: [id number] NG tube placement

EXAM:
ABDOMEN - 1 VIEW

[abdomen kub]
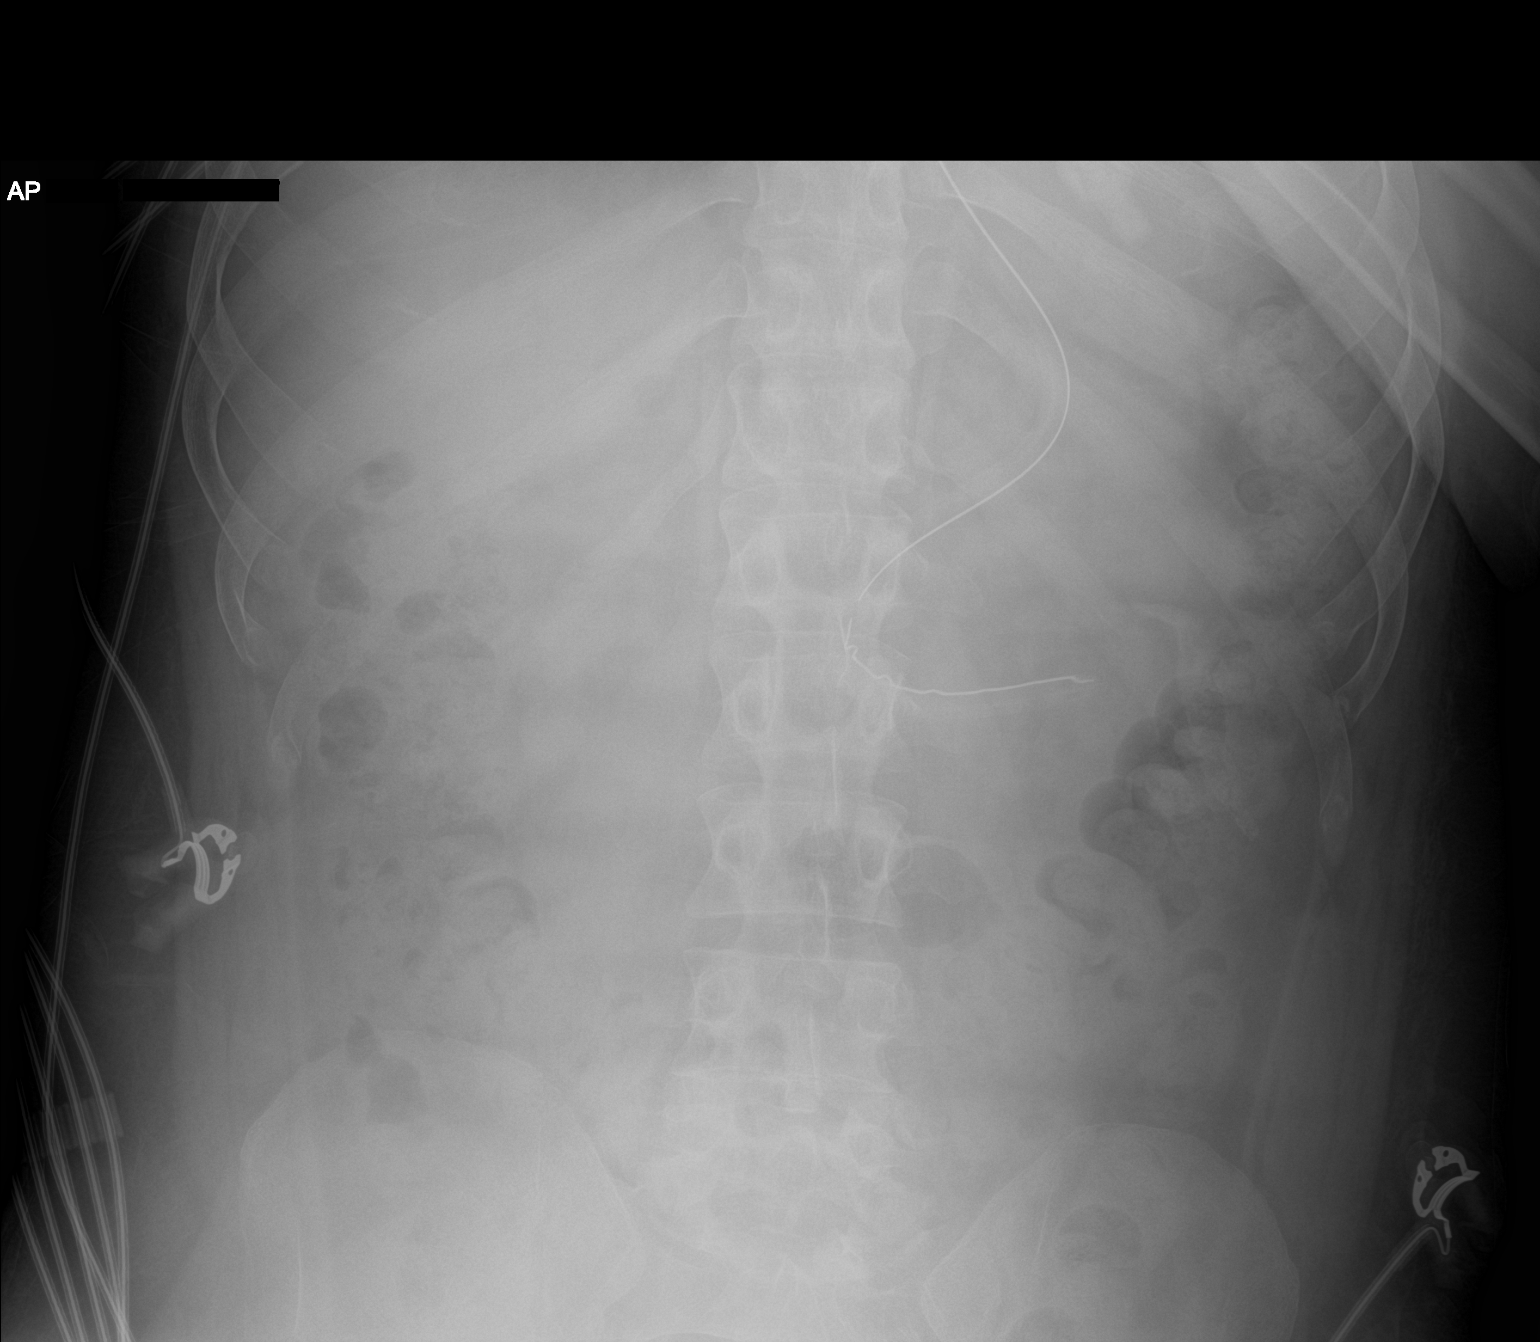

[1 of 1 positions shown; findings below may reference images not displayed]

FINDINGS: There has been interval removal of the G-tube. Tip of the NG tube is
seen at the proximal body of the stomach. The bowel gas pattern is
normal. No radio-opaque calculi or other significant radiographic
abnormality are seen. Moderate stool burden in the colon.
IMPRESSION: Tip of the NG tube is seen in the proximal body of the stomach.

## 2023-11-19 ENCOUNTER — Emergency Department: Payer: Medicaid Other

## 2023-11-19 ENCOUNTER — Other Ambulatory Visit: Payer: Self-pay

## 2023-11-19 ENCOUNTER — Inpatient Hospital Stay
Admission: EM | Admit: 2023-11-19 | Discharge: 2024-05-23 | DRG: 004 | Disposition: A | Discharge status: Other Acute Inpatient Hospital | Payer: Medicaid Other | Attending: Internal Medicine | Admitting: Internal Medicine

## 2023-11-19 DIAGNOSIS — R000 Tachycardia, unspecified: Secondary | ICD-10-CM

## 2023-11-19 DIAGNOSIS — J690 Pneumonitis due to inhalation of food and vomit: Secondary | ICD-10-CM | POA: Diagnosis present

## 2023-11-19 DIAGNOSIS — R1310 Dysphagia, unspecified: Secondary | ICD-10-CM | POA: Diagnosis present

## 2023-11-19 DIAGNOSIS — M24572 Contracture, left ankle: Secondary | ICD-10-CM | POA: Diagnosis present

## 2023-11-19 DIAGNOSIS — I471 Supraventricular tachycardia, unspecified: Secondary | ICD-10-CM | POA: Diagnosis not present

## 2023-11-19 DIAGNOSIS — M24552 Contracture, left hip: Secondary | ICD-10-CM | POA: Diagnosis present

## 2023-11-19 DIAGNOSIS — J9811 Atelectasis: Secondary | ICD-10-CM | POA: Diagnosis not present

## 2023-11-19 DIAGNOSIS — R6521 Severe sepsis with septic shock: Secondary | ICD-10-CM | POA: Diagnosis not present

## 2023-11-19 DIAGNOSIS — T17998A Other foreign object in respiratory tract, part unspecified causing other injury, initial encounter: Secondary | ICD-10-CM | POA: Diagnosis not present

## 2023-11-19 DIAGNOSIS — D649 Anemia, unspecified: Secondary | ICD-10-CM | POA: Diagnosis present

## 2023-11-19 DIAGNOSIS — D75838 Other thrombocytosis: Secondary | ICD-10-CM | POA: Diagnosis not present

## 2023-11-19 DIAGNOSIS — I952 Hypotension due to drugs: Secondary | ICD-10-CM | POA: Diagnosis not present

## 2023-11-19 DIAGNOSIS — Z9911 Dependence on respirator [ventilator] status: Secondary | ICD-10-CM

## 2023-11-19 DIAGNOSIS — G8194 Hemiplegia, unspecified affecting left nondominant side: Secondary | ICD-10-CM | POA: Diagnosis present

## 2023-11-19 DIAGNOSIS — I959 Hypotension, unspecified: Secondary | ICD-10-CM | POA: Diagnosis not present

## 2023-11-19 DIAGNOSIS — E861 Hypovolemia: Secondary | ICD-10-CM | POA: Diagnosis not present

## 2023-11-19 DIAGNOSIS — A419 Sepsis, unspecified organism: Secondary | ICD-10-CM

## 2023-11-19 DIAGNOSIS — J15212 Pneumonia due to Methicillin resistant Staphylococcus aureus: Secondary | ICD-10-CM | POA: Diagnosis present

## 2023-11-19 DIAGNOSIS — J9601 Acute respiratory failure with hypoxia: Secondary | ICD-10-CM | POA: Diagnosis present

## 2023-11-19 DIAGNOSIS — R4182 Altered mental status, unspecified: Secondary | ICD-10-CM | POA: Diagnosis not present

## 2023-11-19 DIAGNOSIS — Z87820 Personal history of traumatic brain injury: Secondary | ICD-10-CM

## 2023-11-19 DIAGNOSIS — L89151 Pressure ulcer of sacral region, stage 1: Secondary | ICD-10-CM | POA: Diagnosis not present

## 2023-11-19 DIAGNOSIS — A4152 Sepsis due to Pseudomonas: Principal | ICD-10-CM | POA: Diagnosis present

## 2023-11-19 DIAGNOSIS — M24532 Contracture, left wrist: Secondary | ICD-10-CM | POA: Diagnosis present

## 2023-11-19 DIAGNOSIS — Z930 Tracheostomy status: Secondary | ICD-10-CM

## 2023-11-19 DIAGNOSIS — J151 Pneumonia due to Pseudomonas: Secondary | ICD-10-CM | POA: Diagnosis present

## 2023-11-19 DIAGNOSIS — E876 Hypokalemia: Secondary | ICD-10-CM | POA: Diagnosis present

## 2023-11-19 DIAGNOSIS — G9341 Metabolic encephalopathy: Secondary | ICD-10-CM | POA: Diagnosis present

## 2023-11-19 DIAGNOSIS — Z79899 Other long term (current) drug therapy: Secondary | ICD-10-CM

## 2023-11-19 DIAGNOSIS — A4102 Sepsis due to Methicillin resistant Staphylococcus aureus: Secondary | ICD-10-CM | POA: Diagnosis present

## 2023-11-19 DIAGNOSIS — Z1152 Encounter for screening for COVID-19: Secondary | ICD-10-CM

## 2023-11-19 DIAGNOSIS — R54 Age-related physical debility: Secondary | ICD-10-CM | POA: Diagnosis present

## 2023-11-19 DIAGNOSIS — S069XAS Unspecified intracranial injury with loss of consciousness status unknown, sequela: Secondary | ICD-10-CM | POA: Diagnosis not present

## 2023-11-19 DIAGNOSIS — J189 Pneumonia, unspecified organism: Secondary | ICD-10-CM | POA: Diagnosis not present

## 2023-11-19 DIAGNOSIS — M24551 Contracture, right hip: Secondary | ICD-10-CM | POA: Diagnosis present

## 2023-11-19 DIAGNOSIS — R569 Unspecified convulsions: Secondary | ICD-10-CM | POA: Diagnosis not present

## 2023-11-19 DIAGNOSIS — W44F9XA Other object of natural or organic material, entering into or through a natural orifice, initial encounter: Secondary | ICD-10-CM | POA: Diagnosis not present

## 2023-11-19 DIAGNOSIS — T4275XA Adverse effect of unspecified antiepileptic and sedative-hypnotic drugs, initial encounter: Secondary | ICD-10-CM | POA: Diagnosis not present

## 2023-11-19 DIAGNOSIS — Z751 Person awaiting admission to adequate facility elsewhere: Secondary | ICD-10-CM

## 2023-11-19 DIAGNOSIS — G40901 Epilepsy, unspecified, not intractable, with status epilepticus: Secondary | ICD-10-CM | POA: Diagnosis present

## 2023-11-19 DIAGNOSIS — G8250 Quadriplegia, unspecified: Secondary | ICD-10-CM | POA: Diagnosis present

## 2023-11-19 DIAGNOSIS — Z7189 Other specified counseling: Secondary | ICD-10-CM | POA: Diagnosis not present

## 2023-11-19 DIAGNOSIS — G9389 Other specified disorders of brain: Secondary | ICD-10-CM | POA: Diagnosis present

## 2023-11-19 DIAGNOSIS — Z515 Encounter for palliative care: Secondary | ICD-10-CM

## 2023-11-19 DIAGNOSIS — I9589 Other hypotension: Secondary | ICD-10-CM | POA: Diagnosis not present

## 2023-11-19 DIAGNOSIS — L89321 Pressure ulcer of left buttock, stage 1: Secondary | ICD-10-CM | POA: Diagnosis present

## 2023-11-19 DIAGNOSIS — L89891 Pressure ulcer of other site, stage 1: Secondary | ICD-10-CM | POA: Diagnosis present

## 2023-11-19 DIAGNOSIS — B965 Pseudomonas (aeruginosa) (mallei) (pseudomallei) as the cause of diseases classified elsewhere: Secondary | ICD-10-CM | POA: Diagnosis not present

## 2023-11-19 DIAGNOSIS — I459 Conduction disorder, unspecified: Secondary | ICD-10-CM | POA: Diagnosis present

## 2023-11-19 DIAGNOSIS — G40909 Epilepsy, unspecified, not intractable, without status epilepticus: Secondary | ICD-10-CM | POA: Diagnosis present

## 2023-11-19 DIAGNOSIS — L8990 Pressure ulcer of unspecified site, unspecified stage: Secondary | ICD-10-CM

## 2023-11-19 DIAGNOSIS — Z7401 Bed confinement status: Secondary | ICD-10-CM

## 2023-11-19 LAB — COMPREHENSIVE METABOLIC PANEL
ALT: 9 U/L (ref 0–44)
AST: 21 U/L (ref 15–41)
Albumin: 2.6 g/dL — ABNORMAL LOW (ref 3.5–5.0)
Alkaline Phosphatase: 47 U/L (ref 38–126)
Anion gap: 8 (ref 5–15)
BUN: 13 mg/dL (ref 6–20)
CO2: 26 mmol/L (ref 22–32)
Calcium: 7.5 mg/dL — ABNORMAL LOW (ref 8.9–10.3)
Chloride: 104 mmol/L (ref 98–111)
Creatinine, Ser: 0.65 mg/dL (ref 0.61–1.24)
GFR, Estimated: >60 mL/min (ref >60)
Glucose, Bld: 107 mg/dL — ABNORMAL HIGH (ref 70–99)
Potassium: 3.4 mmol/L — ABNORMAL LOW (ref 3.5–5.1)
Sodium: 138 mmol/L (ref 135–145)
Total Bilirubin: 1 mg/dL (ref 0.0–1.2)
Total Protein: 5.6 g/dL — ABNORMAL LOW (ref 6.5–8.1)

## 2023-11-19 LAB — CBC WITH DIFFERENTIAL/PLATELET
Abs Immature Granulocytes: 0.02 10*3/uL (ref 0.00–0.07)
Basophils Absolute: 0 10*3/uL (ref 0.0–0.1)
Basophils Relative: 0 %
Eosinophils Absolute: 0 10*3/uL (ref 0.0–0.5)
Eosinophils Relative: 0 %
HCT: 41.9 % (ref 39.0–52.0)
Hemoglobin: 14.3 g/dL (ref 13.0–17.0)
Immature Granulocytes: 1 %
Lymphocytes Relative: 24 %
Lymphs Abs: 1 10*3/uL (ref 0.7–4.0)
MCH: 31.4 pg (ref 26.0–34.0)
MCHC: 34.1 g/dL (ref 30.0–36.0)
MCV: 92.1 fL (ref 80.0–100.0)
Monocytes Absolute: 0.5 10*3/uL (ref 0.1–1.0)
Monocytes Relative: 11 %
Neutro Abs: 2.8 10*3/uL (ref 1.7–7.7)
Neutrophils Relative %: 64 %
Platelets: 214 10*3/uL (ref 150–400)
RBC: 4.55 MIL/uL (ref 4.22–5.81)
RDW: 12.2 % (ref 11.5–15.5)
WBC: 4.3 10*3/uL (ref 4.0–10.5)
nRBC: 0 % (ref 0.0–0.2)

## 2023-11-19 LAB — RESP PANEL BY RT-PCR (RSV, FLU A&B, COVID)  RVPGX2
Influenza A by PCR: NEGATIVE
Influenza B by PCR: NEGATIVE
Resp Syncytial Virus by PCR: NEGATIVE
SARS Coronavirus 2 by RT PCR: NEGATIVE

## 2023-11-19 LAB — URINALYSIS, W/ REFLEX TO CULTURE (INFECTION SUSPECTED)
Bacteria, UA: NONE SEEN
Bilirubin Urine: NEGATIVE
Glucose, UA: NEGATIVE mg/dL
Hgb urine dipstick: NEGATIVE
Ketones, ur: 20 mg/dL — AB
Leukocytes,Ua: NEGATIVE
Nitrite: NEGATIVE
Protein, ur: 30 mg/dL — AB
Specific Gravity, Urine: 1.034 — ABNORMAL HIGH (ref 1.005–1.030)
pH: 5 (ref 5.0–8.0)

## 2023-11-19 LAB — LACTIC ACID, PLASMA
Lactic Acid, Venous: 1.1 mmol/L (ref 0.5–1.9)
Lactic Acid, Venous: 1.4 mmol/L (ref 0.5–1.9)

## 2023-11-19 LAB — APTT: aPTT: 36 s (ref 24–36)

## 2023-11-19 LAB — PROTIME-INR
INR: 1.2 (ref 0.8–1.2)
Prothrombin Time: 15.1 s (ref 11.4–15.2)

## 2023-11-19 MED ORDER — BACLOFEN 10 MG PO TABS: 10.0000 mg | ORAL_TABLET | Freq: Three times a day (TID) | ORAL | Status: DC

## 2023-11-19 MED ORDER — SODIUM CHLORIDE 0.9 % IV BOLUS (SEPSIS)
1000.0000 mL | Freq: Once | INTRAVENOUS | Status: AC
  Administered 2023-11-19: 1000 mL via INTRAVENOUS

## 2023-11-19 MED ORDER — GUAIFENESIN ER 600 MG PO TB12: 600.0000 mg | ORAL_TABLET | Freq: Two times a day (BID) | ORAL | Status: DC

## 2023-11-19 MED ORDER — SODIUM CHLORIDE 0.9 % IV SOLN: 2.0000 g | INTRAVENOUS | Status: DC

## 2023-11-19 MED ORDER — VANCOMYCIN HCL IN DEXTROSE 1-5 GM/200ML-% IV SOLN
1000.0000 mg | Freq: Once | INTRAVENOUS | Status: AC
  Administered 2023-11-19: 1000 mg via INTRAVENOUS
  Filled 2023-11-19: qty 200

## 2023-11-19 MED ORDER — VALPROIC ACID 250 MG PO CAPS
1000.0000 mg | ORAL_CAPSULE | Freq: Two times a day (BID) | ORAL | Status: DC
  Filled 2023-11-19: qty 4

## 2023-11-19 MED ORDER — GABAPENTIN 300 MG PO CAPS: 400.0000 mg | ORAL_CAPSULE | Freq: Three times a day (TID) | ORAL | Status: DC

## 2023-11-19 MED ORDER — HYDROCOD POLI-CHLORPHE POLI ER 10-8 MG/5ML PO SUER: 5.0000 mL | Freq: Two times a day (BID) | ORAL | Status: DC | PRN

## 2023-11-19 MED ORDER — IPRATROPIUM-ALBUTEROL 0.5-2.5 (3) MG/3ML IN SOLN
3.0000 mL | Freq: Four times a day (QID) | RESPIRATORY_TRACT | Status: DC
  Administered 2023-11-20: 3 mL via RESPIRATORY_TRACT
  Filled 2023-11-19: qty 3

## 2023-11-19 MED ORDER — SODIUM CHLORIDE 0.9 % IV SOLN
2.0000 g | Freq: Once | INTRAVENOUS | Status: AC
  Administered 2023-11-19: 2 g via INTRAVENOUS
  Filled 2023-11-19: qty 12.5

## 2023-11-19 MED ORDER — MELATONIN 5 MG PO TABS: 5.0000 mg | ORAL_TABLET | Freq: Every day | ORAL | Status: DC

## 2023-11-19 MED ORDER — IOHEXOL 350 MG/ML SOLN
100.0000 mL | Freq: Once | INTRAVENOUS | Status: AC | PRN
  Administered 2023-11-19: 50 mL via INTRAVENOUS

## 2023-11-19 MED ORDER — SODIUM CHLORIDE 0.9 % IV SOLN
500.0000 mg | INTRAVENOUS | Status: DC
  Administered 2023-11-20 – 2023-11-21 (×2): 500 mg via INTRAVENOUS
  Filled 2023-11-19 (×2): qty 5

## 2023-11-19 MED ORDER — MAGNESIUM HYDROXIDE 400 MG/5ML PO SUSP: 30.0000 mL | Freq: Every day | ORAL | Status: DC | PRN

## 2023-11-19 MED ORDER — LACOSAMIDE 50 MG PO TABS: 100.0000 mg | ORAL_TABLET | Freq: Two times a day (BID) | ORAL | Status: DC

## 2023-11-19 MED ORDER — SENNA 8.6 MG PO TABS: 8.6000 mg | ORAL_TABLET | Freq: Every day | ORAL | Status: DC

## 2023-11-19 MED ORDER — ACETAMINOPHEN 325 MG RE SUPP: 650.0000 mg | Freq: Four times a day (QID) | RECTAL | Status: DC | PRN

## 2023-11-19 MED ORDER — POLYETHYLENE GLYCOL 3350 17 G PO PACK
17.0000 g | PACK | Freq: Every day | ORAL | Status: DC
  Administered 2023-11-20 – 2023-11-26 (×6): 17 g via ORAL
  Filled 2023-11-19 (×6): qty 1

## 2023-11-19 MED ORDER — LACTATED RINGERS IV SOLN
150.0000 mL/h | INTRAVENOUS | Status: DC
  Administered 2023-11-20 (×3): 150 mL/h via INTRAVENOUS

## 2023-11-19 MED ORDER — SODIUM CHLORIDE 0.9 % IV BOLUS
1000.0000 mL | Freq: Once | INTRAVENOUS | Status: AC
  Administered 2023-11-19: 1000 mL via INTRAVENOUS

## 2023-11-19 MED ORDER — TRAZODONE HCL 50 MG PO TABS: 25.0000 mg | ORAL_TABLET | Freq: Every evening | ORAL | Status: DC | PRN

## 2023-11-19 MED ORDER — ONDANSETRON HCL 4 MG/2ML IJ SOLN: 4.0000 mg | Freq: Four times a day (QID) | INTRAMUSCULAR | Status: DC | PRN

## 2023-11-19 MED ORDER — ACETAMINOPHEN 325 MG PO TABS: 650.0000 mg | ORAL_TABLET | Freq: Four times a day (QID) | ORAL | Status: DC | PRN

## 2023-11-19 MED ORDER — METRONIDAZOLE 500 MG/100ML IV SOLN
500.0000 mg | Freq: Once | INTRAVENOUS | Status: AC
  Administered 2023-11-19: 500 mg via INTRAVENOUS
  Filled 2023-11-19: qty 100

## 2023-11-19 MED ORDER — ONDANSETRON HCL 4 MG PO TABS: 4.0000 mg | ORAL_TABLET | Freq: Four times a day (QID) | ORAL | Status: DC | PRN

## 2023-11-19 MED ORDER — ENOXAPARIN SODIUM 40 MG/0.4ML IJ SOSY
40.0000 mg | PREFILLED_SYRINGE | INTRAMUSCULAR | Status: DC
  Administered 2023-11-20 – 2023-12-03 (×15): 40 mg via SUBCUTANEOUS
  Filled 2023-11-19 (×15): qty 0.4

## 2023-11-19 NOTE — ED Notes (Signed)
IV team and lab came to draw rest of labs, stated Pt has no veins for an IV or PICC line. EDP notified if he wants a central line for the rest of his labs.

## 2023-11-19 NOTE — ED Provider Notes (Signed)
[facility] Provider Note    Event Date/Time   First MD Initiated Contact with Patient 11/19/23 1536     (approximate)   History   Altered Mental Status   HPI Alex [name] is a 28 y.o. male with history of epilepsy, traumatic brain injury presenting today for altered mental status.  Facility reported that patient was at his normal baseline this morning which is combative and intermittently speaking incoherently.  Around lunchtime he became more unresponsive and was not talkative anymore.  They noticed a cough and slightly increased work of breathing.  Otherwise they denied any nausea, vomiting, diarrhea.  Patient not able to contribute to any history.  EMS reported he was febrile with tachycardia but otherwise blood pressure stable and not requiring oxygen.  At baseline has contractures in his extremities.     Physical Exam   Triage Vital Signs: ED Triage Vitals  Encounter Vitals Group     BP --      Systolic BP Percentile --      Diastolic BP Percentile --      Pulse --      Resp --      Temp --      Temp src --      SpO2 11/19/23 1539 92 %     Weight 11/19/23 1542 153 lb 3.2 oz (69.5 kg)     Height 11/19/23 1542 5\' 9"  (1.753 m)     Head Circumference --      Peak Flow --      Pain Score --      Pain Loc --      Pain Education --      Exclude from Growth Chart --     Most recent vital signs: Vitals:   11/19/23 2114 11/19/23 2200  BP:  (!) 88/51  Pulse:  88  Resp: 14 17  Temp:    SpO2: 93% 100%   I have reviewed the vital signs. General:  Awake, alert, Looking around the room but not responsive. Head:  Normocephalic, Atraumatic. EENT:  PERRL, EOMI, Oral mucosa pink and moist, Neck is supple. Cardiovascular: tachycardic, regular rhythm, 2+ distal pulses. Respiratory: Mild tachypnea.  Crackles bilaterally.  No wheezing.  No accessory muscle usage Extremities: Contractures present in all extremities Neuro:  Alert.  Eyes open and  looking around the room but not consistently tracking.  Occasionally grunts but no other speaking or coherent words.  Withdraws to pain.  Slightly diminished from his previous baseline. Skin: Noticeably warm throughout body with slight diaphoresis     ED Results / Procedures / Treatments   Labs (all labs ordered are listed, but only abnormal results are displayed) Labs Reviewed  URINALYSIS, W/ REFLEX TO CULTURE (INFECTION SUSPECTED) - Abnormal; Notable for the following components:      Result Value   Color, Urine AMBER (*)    APPearance CLEAR (*)    Specific Gravity, Urine 1.034 (*)    Ketones, ur 20 (*)    Protein, ur 30 (*)    All other components within normal limits  COMPREHENSIVE METABOLIC PANEL - Abnormal; Notable for the following components:   Potassium 3.4 (*)    Glucose, Bld 107 (*)    Calcium 7.5 (*)    Total Protein 5.6 (*)    Albumin 2.6 (*)    All other components within normal limits  RESP PANEL BY RT-PCR (RSV, FLU A&B, COVID)  RVPGX2  CULTURE, BLOOD (ROUTINE X 2)  CULTURE, BLOOD (ROUTINE X 2)  LACTIC ACID, PLASMA  LACTIC ACID, PLASMA  PROTIME-INR  APTT  CBC WITH DIFFERENTIAL/PLATELET  CBC WITH DIFFERENTIAL/PLATELET     EKG My EKG interpretation: Rate of 111, sinus tachycardia.  Normal axis, normal intervals, no acute ST elevations or depressions   RADIOLOGY Independently interpreted chest x-ray with no acute abnormalities   PROCEDURES:  Critical Care performed: Yes, see critical care procedure note(s)  .Critical Care  Performed by: Janith Lima, MD Authorized by: Janith Lima, MD   Critical care provider statement:    Critical care time (minutes):  30   Critical care was necessary to treat or prevent imminent or life-threatening deterioration of the following conditions:  Respiratory failure and sepsis   Critical care was time spent personally by me on the following activities:  Development of treatment plan with patient or surrogate,  discussions with consultants, evaluation of patient's response to treatment, examination of patient, ordering and review of laboratory studies, ordering and review of radiographic studies, ordering and performing treatments and interventions, pulse oximetry, re-evaluation of patient's condition and review of old charts   I assumed direction of critical care for this patient from another provider in my specialty: no     Care discussed with: admitting provider      MEDICATIONS ORDERED IN ED: Medications  sodium chloride 0.9 % bolus 1,000 mL (0 mLs Intravenous Stopped 11/19/23 1700)  ceFEPIme (MAXIPIME) 2 g in sodium chloride 0.9 % 100 mL IVPB (0 g Intravenous Stopped 11/19/23 1630)  metroNIDAZOLE (FLAGYL) IVPB 500 mg (0 mg Intravenous Stopped 11/19/23 1748)  vancomycin (VANCOCIN) IVPB 1000 mg/200 mL premix (0 mg Intravenous Stopped 11/19/23 1917)  sodium chloride 0.9 % bolus 1,000 mL (0 mLs Intravenous Stopped 11/19/23 1916)  iohexol (OMNIPAQUE) 350 MG/ML injection 100 mL (50 mLs Intravenous Contrast Given 11/19/23 2138)     IMPRESSION / MDM / ASSESSMENT AND PLAN / ED COURSE  I reviewed the triage vital signs and the nursing notes.                              Differential diagnosis includes, but is not limited to, COVID, flu, RSV, pneumonia, norovirus, sepsis, UTI, metabolic encephalopathy  Patient's presentation is most consistent with acute presentation with potential threat to life or bodily function.  Patient is a 28 year old male presenting today for fever and altered mental status.  Vital signs on arrival showed tachycardia but not febrile with [place].  Is worse than his normal mental status baseline.  Laboratory workup initially largely reassuring with no elevated lactic.  Viral panel negative.  UA shows dehydration but otherwise unremarkable.  He was initially started on sepsis protocol given tachycardia with [place] and fever in the field.  Given broad-spectrum antibiotics.  Initial chest x-ray  unremarkable and CT head negative.  Patient was then found to be hypoxic at 85% on room air.  CTA chest was ordered for further evaluation.  This shows concern for aspiration pneumonia.  Will continue with antibiotics and admit to hospitalist for further care.  Patient placed on 4 L oxygen.  Admitted for sepsis secondary to aspiration pneumonia with acute hypoxic respiratory failure.  The patient is on the cardiac monitor to evaluate for evidence of arrhythmia and/or significant heart rate changes. Clinical Course as of 11/19/23 2259  Mon Nov 19, 2023  2114 Patient was reassessed with new onset hypoxia down to 85% requiring placement of 2 L nasal  cannula.  Chest x-ray previously negative but does have notable productive cough.  Will get CTA chest to rule out occult pneumonia versus PE [DW]    Clinical Course User Index [DW] Janith Lima, MD     FINAL CLINICAL IMPRESSION(S) / ED DIAGNOSES   Final diagnoses:  Acute hypoxic respiratory failure (HCC)  Aspiration pneumonia of right lung, unspecified aspiration pneumonia type, unspecified part of lung (HCC)  Sepsis, due to unspecified organism, unspecified whether acute organ dysfunction present [facility])     Rx / DC Orders   ED Discharge Orders     None        Note:  This document was prepared using Dragon voice recognition software and may include unintentional dictation errors.   Janith Lima, MD 11/19/23 2300

## 2023-11-19 NOTE — Sepsis Progress Note (Signed)
Sepsis protocol is being followed by [facility].

## 2023-11-19 NOTE — ED Triage Notes (Signed)
28 yo male arrives [facility] Pt not at [facility], Hx of TBI Normally combative and speaks incoherrtenylt Pt is unresponsive to pain EMS vitals 120/80, HR 130 BS: 103 100.7 axillary 35-37 End tidal  Ate breakfast not lunch, was fine until mid morning  Normally contracted  Gave no meds or Tx

## 2023-11-19 NOTE — ED Notes (Signed)
Pt difficult IV stick pulled small amount of labs bu unable to pull all labs and blood cultures prior to fluids and IV Abx. IV team consulted STAT

## 2023-11-19 NOTE — Consult Note (Signed)
CODE SEPSIS - PHARMACY COMMUNICATION  **Broad Spectrum Antibiotics should be administered within 1 hour of Sepsis diagnosis**  Time Code Sepsis Called/Page Received: 1541  Antibiotics Ordered: cefepime, vancomycin, flagyl  Time of 1st antibiotic administration: 1600  Additional action taken by pharmacy: n/a  If necessary, Name of Provider/Nurse Contacted: n/a    Bettey Costa ,PharmD Clinical Pharmacist  11/19/2023  3:42 PM

## 2023-11-19 NOTE — Progress Notes (Signed)
At bedside for PIV insertion. Attempted x 2 with no success. Very poor vasculature noted. Left arm with contractures. RN at bedside, and will make MD aware.

## 2023-11-19 NOTE — ED Notes (Signed)
Pt placed in hospital bed to increase comfort.
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
## 2023-11-19 NOTE — ED Notes (Signed)
CT notified Pt ready whenever they want to come get him for imaging

## 2023-11-19 NOTE — ED Notes (Signed)
Lab consulted to come draw cultures and labs

## 2023-11-19 NOTE — ED Notes (Signed)
Lab at bedside to get bloodwork

## 2023-11-20 ENCOUNTER — Inpatient Hospital Stay: Payer: Medicaid Other

## 2023-11-20 ENCOUNTER — Other Ambulatory Visit: Payer: Self-pay

## 2023-11-20 DIAGNOSIS — A419 Sepsis, unspecified organism: Secondary | ICD-10-CM | POA: Diagnosis not present

## 2023-11-20 DIAGNOSIS — J9601 Acute respiratory failure with hypoxia: Secondary | ICD-10-CM | POA: Diagnosis not present

## 2023-11-20 DIAGNOSIS — G40909 Epilepsy, unspecified, not intractable, without status epilepticus: Secondary | ICD-10-CM

## 2023-11-20 DIAGNOSIS — J690 Pneumonitis due to inhalation of food and vomit: Secondary | ICD-10-CM

## 2023-11-20 DIAGNOSIS — R6521 Severe sepsis with septic shock: Secondary | ICD-10-CM | POA: Diagnosis not present

## 2023-11-20 DIAGNOSIS — E876 Hypokalemia: Secondary | ICD-10-CM

## 2023-11-20 DIAGNOSIS — G9341 Metabolic encephalopathy: Secondary | ICD-10-CM

## 2023-11-20 LAB — CBC
HCT: 35.3 % — ABNORMAL LOW (ref 39.0–52.0)
Hemoglobin: 11.8 g/dL — ABNORMAL LOW (ref 13.0–17.0)
MCH: 31.5 pg (ref 26.0–34.0)
MCHC: 33.4 g/dL (ref 30.0–36.0)
MCV: 94.1 fL (ref 80.0–100.0)
Platelets: 179 10*3/uL (ref 150–400)
RBC: 3.75 MIL/uL — ABNORMAL LOW (ref 4.22–5.81)
RDW: 12 % (ref 11.5–15.5)
WBC: 5.5 10*3/uL (ref 4.0–10.5)
nRBC: 0 % (ref 0.0–0.2)

## 2023-11-20 LAB — MAGNESIUM
Magnesium: 1.8 mg/dL (ref 1.7–2.4)
Magnesium: 2 mg/dL (ref 1.7–2.4)

## 2023-11-20 LAB — BLOOD GAS, ARTERIAL
Acid-base deficit: 1.2 mmol/L (ref 0.0–2.0)
Bicarbonate: 22.6 mmol/L (ref 20.0–28.0)
FIO2: 100 %
MECHVT: 550 mL
Mechanical Rate: 16
O2 Saturation: 97.7 %
PEEP: 8 cmH2O
Patient temperature: 37
pCO2 arterial: 34 mm[Hg] (ref 32–48)
pH, Arterial: 7.43 (ref 7.35–7.45)
pO2, Arterial: 84 mm[Hg] (ref 83–108)

## 2023-11-20 LAB — BASIC METABOLIC PANEL
Anion gap: 9 (ref 5–15)
BUN: 8 mg/dL (ref 6–20)
CO2: 22 mmol/L (ref 22–32)
Calcium: 7.7 mg/dL — ABNORMAL LOW (ref 8.9–10.3)
Chloride: 108 mmol/L (ref 98–111)
Creatinine, Ser: 0.51 mg/dL — ABNORMAL LOW (ref 0.61–1.24)
GFR, Estimated: >60 mL/min (ref >60)
Glucose, Bld: 141 mg/dL — ABNORMAL HIGH (ref 70–99)
Potassium: 3 mmol/L — ABNORMAL LOW (ref 3.5–5.1)
Sodium: 139 mmol/L (ref 135–145)

## 2023-11-20 LAB — HIV ANTIBODY (ROUTINE TESTING W REFLEX): HIV Screen 4th Generation wRfx: NONREACTIVE

## 2023-11-20 LAB — PROTIME-INR
INR: 1.2 (ref 0.8–1.2)
Prothrombin Time: 15.4 s — ABNORMAL HIGH (ref 11.4–15.2)

## 2023-11-20 LAB — PROCALCITONIN
Procalcitonin: <0.1 ng/mL
Procalcitonin: <0.1 ng/mL

## 2023-11-20 LAB — CBG MONITORING, ED
Glucose-Capillary: 166 mg/dL — ABNORMAL HIGH (ref 70–99)
Glucose-Capillary: 183 mg/dL — ABNORMAL HIGH (ref 70–99)

## 2023-11-20 LAB — VALPROIC ACID LEVEL: Valproic Acid Lvl: 66 ug/mL (ref 50.0–100.0)

## 2023-11-20 LAB — LACTIC ACID, PLASMA
Lactic Acid, Venous: 1.4 mmol/L (ref 0.5–1.9)
Lactic Acid, Venous: 1.4 mmol/L (ref 0.5–1.9)

## 2023-11-20 LAB — GLUCOSE, CAPILLARY
Glucose-Capillary: 144 mg/dL — ABNORMAL HIGH (ref 70–99)
Glucose-Capillary: 138 mg/dL — ABNORMAL HIGH (ref 70–99)

## 2023-11-20 LAB — MRSA NEXT GEN BY PCR, NASAL: MRSA by PCR Next Gen: DETECTED — AB

## 2023-11-20 LAB — CORTISOL-AM, BLOOD: Cortisol - AM: 11.8 ug/dL (ref 6.7–22.6)

## 2023-11-20 LAB — POTASSIUM: Potassium: 3.8 mmol/L (ref 3.5–5.1)

## 2023-11-20 LAB — PHOSPHORUS
Phosphorus: 1.8 mg/dL — ABNORMAL LOW (ref 2.5–4.6)
Phosphorus: 2.5 mg/dL (ref 2.5–4.6)

## 2023-11-20 MED ORDER — ACETAMINOPHEN 325 MG PO TABS
650.0000 mg | ORAL_TABLET | Freq: Four times a day (QID) | ORAL | Status: DC | PRN
  Administered 2023-11-21 – 2024-05-17 (×32): 650 mg
  Filled 2023-11-20 (×35): qty 2

## 2023-11-20 MED ORDER — POTASSIUM CHLORIDE 10 MEQ/100ML IV SOLN
10.0000 meq | Freq: Once | INTRAVENOUS | Status: AC
  Administered 2023-11-20: 10 meq via INTRAVENOUS

## 2023-11-20 MED ORDER — MIDAZOLAM HCL 2 MG/2ML IJ SOLN
1.0000 mg | INTRAMUSCULAR | Status: DC | PRN
  Administered 2023-11-20 – 2023-11-26 (×8): 2 mg via INTRAVENOUS
  Filled 2023-11-20 (×9): qty 2

## 2023-11-20 MED ORDER — FENTANYL BOLUS VIA INFUSION
50.0000 ug | INTRAVENOUS | Status: DC | PRN
  Administered 2023-11-20 – 2023-11-23 (×7): 50 ug via INTRAVENOUS

## 2023-11-20 MED ORDER — DOCUSATE SODIUM 50 MG/5ML PO LIQD
100.0000 mg | Freq: Two times a day (BID) | ORAL | Status: DC
  Administered 2023-11-20 – 2023-11-26 (×12): 100 mg
  Filled 2023-11-20 (×12): qty 10

## 2023-11-20 MED ORDER — ROCURONIUM BROMIDE 10 MG/ML (PF) SYRINGE
100.0000 mg | PREFILLED_SYRINGE | Freq: Once | INTRAVENOUS | Status: AC
Start: 2023-11-20 — End: 2023-11-20

## 2023-11-20 MED ORDER — DEXMEDETOMIDINE HCL IN NACL 400 MCG/100ML IV SOLN
INTRAVENOUS | Status: AC
  Filled 2023-11-20: qty 100

## 2023-11-20 MED ORDER — FAMOTIDINE 20 MG PO TABS
20.0000 mg | ORAL_TABLET | Freq: Two times a day (BID) | ORAL | Status: DC
  Filled 2023-11-20: qty 1

## 2023-11-20 MED ORDER — PHENTOLAMINE MESYLATE 5 MG IJ SOLR
5.0000 mg | Freq: Once | INTRAMUSCULAR | Status: AC
  Administered 2023-11-20: 5 mg via SUBCUTANEOUS
  Filled 2023-11-20: qty 5

## 2023-11-20 MED ORDER — FENTANYL 2500MCG IN NS 250ML (10MCG/ML) PREMIX INFUSION
0.0000 ug/h | INTRAVENOUS | Status: DC
  Administered 2023-11-20: 25 ug/h via INTRAVENOUS
  Administered 2023-11-21: 300 ug/h via INTRAVENOUS
  Administered 2023-11-21: 125 ug/h via INTRAVENOUS
  Administered 2023-11-22: 25 ug/h via INTRAVENOUS
  Administered 2023-11-23 – 2023-11-25 (×2): 100 ug/h via INTRAVENOUS
  Administered 2023-11-26: 75 ug/h via INTRAVENOUS
  Filled 2023-11-20 (×7): qty 250

## 2023-11-20 MED ORDER — SODIUM CHLORIDE 0.9 % IV SOLN
3.0000 g | Freq: Four times a day (QID) | INTRAVENOUS | Status: DC
  Administered 2023-11-20 – 2023-11-21 (×5): 3 g via INTRAVENOUS
  Filled 2023-11-20 (×7): qty 8

## 2023-11-20 MED ORDER — POLYETHYLENE GLYCOL 3350 17 G PO PACK: 17.0000 g | PACK | Freq: Every day | ORAL | Status: DC | PRN

## 2023-11-20 MED ORDER — SODIUM CHLORIDE 0.9 % IV SOLN
100.0000 mg | Freq: Two times a day (BID) | INTRAVENOUS | Status: DC
  Administered 2023-11-20 – 2023-11-22 (×7): 100 mg via INTRAVENOUS
  Filled 2023-11-20 (×8): qty 10

## 2023-11-20 MED ORDER — NITROGLYCERIN 2 % TD OINT
1.0000 [in_us] | TOPICAL_OINTMENT | Freq: Three times a day (TID) | TRANSDERMAL | Status: AC
  Administered 2023-11-20 – 2023-11-21 (×6): 1 [in_us] via TOPICAL
  Filled 2023-11-20 (×6): qty 1

## 2023-11-20 MED ORDER — ROCURONIUM BROMIDE 10 MG/ML (PF) SYRINGE
PREFILLED_SYRINGE | INTRAVENOUS | Status: AC
  Administered 2023-11-20: 100 mg via INTRAVENOUS
  Filled 2023-11-20: qty 10

## 2023-11-20 MED ORDER — FENTANYL CITRATE PF 50 MCG/ML IJ SOSY
50.0000 ug | PREFILLED_SYRINGE | INTRAMUSCULAR | Status: DC | PRN
  Administered 2023-11-20: 100 ug via INTRAVENOUS
  Administered 2023-11-20: 50 ug via INTRAVENOUS
  Filled 2023-11-20: qty 2
  Filled 2023-11-20 (×2): qty 1

## 2023-11-20 MED ORDER — DEXMEDETOMIDINE HCL IN NACL 400 MCG/100ML IV SOLN
0.0000 ug/kg/h | INTRAVENOUS | Status: DC
  Filled 2023-11-20 (×3): qty 100

## 2023-11-20 MED ORDER — NOREPINEPHRINE 4 MG/250ML-% IV SOLN
0.0000 ug/min | INTRAVENOUS | Status: DC
  Administered 2023-11-20: 11 ug/min via INTRAVENOUS
  Administered 2023-11-20 (×2): 12 ug/min via INTRAVENOUS
  Administered 2023-11-21: 7 ug/min via INTRAVENOUS
  Administered 2023-11-21: 13 ug/min via INTRAVENOUS
  Administered 2023-11-21: 8 ug/min via INTRAVENOUS
  Administered 2023-11-22: 5 ug/min via INTRAVENOUS
  Administered 2023-11-22: 6 ug/min via INTRAVENOUS
  Filled 2023-11-20 (×10): qty 250

## 2023-11-20 MED ORDER — SODIUM CHLORIDE 0.9 % IV SOLN
250.0000 mL | INTRAVENOUS | Status: AC
  Administered 2023-11-20 (×2): 250 mL via INTRAVENOUS

## 2023-11-20 MED ORDER — SODIUM CHLORIDE 0.9 % IV BOLUS
1000.0000 mL | Freq: Once | INTRAVENOUS | Status: AC
  Administered 2023-11-20: 1000 mL via INTRAVENOUS

## 2023-11-20 MED ORDER — PANTOPRAZOLE SODIUM 40 MG IV SOLR
40.0000 mg | Freq: Two times a day (BID) | INTRAVENOUS | Status: DC
  Administered 2023-11-20 – 2024-01-05 (×94): 40 mg via INTRAVENOUS
  Filled 2023-11-20 (×94): qty 10

## 2023-11-20 MED ORDER — POTASSIUM & SODIUM PHOSPHATES 280-160-250 MG PO PACK
2.0000 | PACK | ORAL | Status: DC
  Filled 2023-11-20 (×3): qty 2

## 2023-11-20 MED ORDER — DOCUSATE SODIUM 50 MG/5ML PO LIQD: 100.0000 mg | Freq: Two times a day (BID) | ORAL | Status: DC

## 2023-11-20 MED ORDER — FENTANYL CITRATE PF 50 MCG/ML IJ SOSY
50.0000 ug | PREFILLED_SYRINGE | INTRAMUSCULAR | Status: DC | PRN
  Administered 2023-11-20: 50 ug via INTRAVENOUS

## 2023-11-20 MED ORDER — MIDAZOLAM HCL 2 MG/2ML IJ SOLN
INTRAMUSCULAR | Status: AC
  Filled 2023-11-20: qty 4

## 2023-11-20 MED ORDER — ACETAMINOPHEN 650 MG RE SUPP
650.0000 mg | Freq: Four times a day (QID) | RECTAL | Status: DC | PRN
  Administered 2023-11-21: 650 mg via RECTAL
  Filled 2023-11-20 (×6): qty 1

## 2023-11-20 MED ORDER — FENTANYL CITRATE PF 50 MCG/ML IJ SOSY: 100.0000 ug | PREFILLED_SYRINGE | Freq: Once | INTRAMUSCULAR | Status: AC

## 2023-11-20 MED ORDER — VALPROIC ACID 250 MG/5ML PO SOLN: 500.0000 mg | Freq: Every morning | ORAL | Status: DC

## 2023-11-20 MED ORDER — IPRATROPIUM-ALBUTEROL 0.5-2.5 (3) MG/3ML IN SOLN
3.0000 mL | Freq: Four times a day (QID) | RESPIRATORY_TRACT | Status: DC
Start: 2023-11-20 — End: 2023-11-26
  Administered 2023-11-20 – 2023-11-26 (×24): 3 mL via RESPIRATORY_TRACT
  Filled 2023-11-20 (×24): qty 3

## 2023-11-20 MED ORDER — SODIUM CHLORIDE 0.9% FLUSH: 10.0000 mL | INTRAVENOUS | Status: DC | PRN

## 2023-11-20 MED ORDER — ETOMIDATE 2 MG/ML IV SOLN: 20.0000 mg | Freq: Once | INTRAVENOUS | Status: AC

## 2023-11-20 MED ORDER — FENTANYL CITRATE PF 50 MCG/ML IJ SOSY
PREFILLED_SYRINGE | INTRAMUSCULAR | Status: AC
  Administered 2023-11-20: 100 ug via INTRAVENOUS
  Filled 2023-11-20: qty 2

## 2023-11-20 MED ORDER — MAGNESIUM SULFATE 2 GM/50ML IV SOLN
2.0000 g | Freq: Once | INTRAVENOUS | Status: AC
Start: 2023-11-20 — End: 2023-11-20
  Administered 2023-11-20: 2 g via INTRAVENOUS
  Filled 2023-11-20: qty 50

## 2023-11-20 MED ORDER — SODIUM CHLORIDE 0.9% FLUSH
10.0000 mL | Freq: Two times a day (BID) | INTRAVENOUS | Status: DC
  Administered 2023-11-20 – 2023-11-25 (×10): 10 mL
  Administered 2023-11-25: 30 mL
  Administered 2023-11-26 – 2023-11-29 (×7): 10 mL
  Administered 2023-11-30: 30 mL
  Administered 2023-11-30 – 2023-12-10 (×19): 10 mL
  Administered 2023-12-10: 20 mL
  Administered 2023-12-11 – 2024-01-18 (×63): 10 mL

## 2023-11-20 MED ORDER — POTASSIUM CHLORIDE 10 MEQ/100ML IV SOLN
10.0000 meq | INTRAVENOUS | Status: AC
  Administered 2023-11-20: 10 meq via INTRAVENOUS
  Filled 2023-11-20 (×2): qty 100

## 2023-11-20 MED ORDER — SODIUM CHLORIDE 0.9 % IV SOLN: 250.0000 mL | INTRAVENOUS | Status: DC

## 2023-11-20 MED ORDER — ORAL CARE MOUTH RINSE
15.0000 mL | OROMUCOSAL | Status: DC
  Administered 2023-11-20 – 2023-11-22 (×22): 15 mL via OROMUCOSAL

## 2023-11-20 MED ORDER — BACLOFEN 10 MG PO TABS
10.0000 mg | ORAL_TABLET | Freq: Three times a day (TID) | ORAL | Status: DC
  Administered 2023-11-20 – 2024-05-23 (×550): 10 mg
  Filled 2023-11-20 (×560): qty 1

## 2023-11-20 MED ORDER — CHLORHEXIDINE GLUCONATE CLOTH 2 % EX PADS
6.0000 | MEDICATED_PAD | Freq: Every day | CUTANEOUS | Status: DC
  Administered 2023-11-20: 6 via TOPICAL

## 2023-11-20 MED ORDER — MIDAZOLAM HCL 2 MG/2ML IJ SOLN
4.0000 mg | Freq: Once | INTRAMUSCULAR | Status: AC
Start: 2023-11-20 — End: 2023-11-20
  Administered 2023-11-20: 4 mg via INTRAVENOUS

## 2023-11-20 MED ORDER — ONDANSETRON HCL 4 MG/2ML IJ SOLN: 4.0000 mg | Freq: Four times a day (QID) | INTRAMUSCULAR | Status: DC | PRN

## 2023-11-20 MED ORDER — VALPROATE SODIUM 100 MG/ML IV SOLN
500.0000 mg | Freq: Every morning | INTRAVENOUS | Status: DC
  Administered 2023-11-20 – 2023-11-22 (×3): 500 mg via INTRAVENOUS
  Filled 2023-11-20 (×4): qty 5

## 2023-11-20 MED ORDER — DOCUSATE SODIUM 100 MG PO CAPS: 100.0000 mg | ORAL_CAPSULE | Freq: Two times a day (BID) | ORAL | Status: DC | PRN

## 2023-11-20 MED ORDER — ETOMIDATE 2 MG/ML IV SOLN
INTRAVENOUS | Status: AC
  Administered 2023-11-20: 20 mg via INTRAVENOUS
  Filled 2023-11-20: qty 10

## 2023-11-20 MED ORDER — NOREPINEPHRINE 4 MG/250ML-% IV SOLN
INTRAVENOUS | Status: AC
  Administered 2023-11-20: 5 ug/min via INTRAVENOUS
  Filled 2023-11-20: qty 250

## 2023-11-20 MED ORDER — LACOSAMIDE 50 MG PO TABS: 100.0000 mg | ORAL_TABLET | Freq: Two times a day (BID) | ORAL | Status: DC

## 2023-11-20 MED ORDER — POTASSIUM PHOSPHATES 15 MMOLE/5ML IV SOLN
30.0000 mmol | Freq: Once | INTRAVENOUS | Status: AC
  Administered 2023-11-20: 30 mmol via INTRAVENOUS
  Filled 2023-11-20: qty 10

## 2023-11-20 MED ORDER — ONDANSETRON HCL 4 MG PO TABS: 4.0000 mg | ORAL_TABLET | Freq: Four times a day (QID) | ORAL | Status: DC | PRN

## 2023-11-20 MED ORDER — POLYETHYLENE GLYCOL 3350 17 G PO PACK
17.0000 g | PACK | Freq: Every day | ORAL | Status: DC
  Administered 2023-11-20: 17 g
  Filled 2023-11-20: qty 1

## 2023-11-20 MED ORDER — ORAL CARE MOUTH RINSE: 15.0000 mL | OROMUCOSAL | Status: DC | PRN

## 2023-11-20 MED ORDER — POTASSIUM CHLORIDE 20 MEQ PO PACK
40.0000 meq | PACK | Freq: Once | ORAL | Status: DC
  Filled 2023-11-20: qty 2

## 2023-11-20 NOTE — ED Notes (Signed)
Warm compress removed.

## 2023-11-20 NOTE — ED Notes (Signed)
Pt placed on NRB. Dr. Arville Care aware. RT at bedside. Per Dr. Arville Care have abg preformed see if oxygen comes up with NRB and consult ED provider if intubation in necessary

## 2023-11-20 NOTE — ED Notes (Signed)
Warm compress applied to right ac/upper arm at iv infiltration site.

## 2023-11-20 NOTE — Assessment & Plan Note (Addendum)
 Patient on valproic  acid, Vimpat  and gabapentin .  EEG did not show any seizure activity.

## 2023-11-20 NOTE — Consult Note (Signed)
NAME:  Alex [name], MRN:  [id number], DOB:  1996-10-10, LOS: 1 ADMISSION DATE:  11/19/2023, CONSULTATION DATE:  11/20/23 REFERRING MD:  Dr. Arville Care, CHIEF COMPLAINT:  AMS   History of Present Illness:  28 yo M presenting to [facility] ED from outpatient rehab on 11/19/23 for evaluation of altered mental status.  History obtained per chart review and mother's telephone interview, patient unable to participate in interview due to respiratory distress and baseline TBI. 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 He was treated at [facility] for 5 months then discharged to rehab. Per his mother and legal guardian his baseline is: Non verbal but he will yell out sporadic nonsensical words with left sided paralysis. He is able to move his RUE in order to feed himself with finger foods and he has some involuntary movement with that arm, he will swat at you. Similar baseline function with RLE, he can kick and move, but often will lay it bent and to the side. He has enough strength to even try and get out of bed on the right side. She denies any issues with swallowing, but eats mostly soft food. If he is not watched closely with eating he will try to eat all the food at once. The facility staff reported he was at his normal baseline until lunchtime on 11/19/23. It was observed he was more somnolent and not interactive. Staff noted a cough and slightly increased work of breathing. Mom also confirmed noting a congested cough the last time she visited earlier in the week. Staff and mom denied nausea/ vomiting, but mom reports chronic loose stools.  EMS reported the patient febrile and tachycardic on arrival.  ED course: Upon arrival patient tachycardic and lethargic. Sepsis protocol initiated with antibiotics and IVF resuscitation. Labs significant for mild hypokalemia, otherwise WNL. Initial imaging unremarkable but then patient became hypoxic with SpO2 85% on RA and a CTa was obtained, negative  for PE but concerning for aspiration pneumonia. TRH consulted for admission. While patient was pending admission in ED he became increasingly hypoxic in the 60's, placed on a NRB without improvement. PCCM alerted the need to emergently intubate the patient. Medications given: Cefepime, Flagyl, Vancomycin, 2 L IVF bolus, IV contrast Initial Vitals: 97.9, 17, 130, 119/82 & 92 on RA Significant labs: (Labs/ Imaging personally reviewed) EKG : pending Chemistry: Na+: 138, K+: 3.4, BUN/Cr.: 13/ 0.65, Serum CO2/ AG: 26/8 Hematology: WBC: 4.3, Hgb: 14.3,  Lactic/ PCT: 1.1 > 1.4 / pending, COVID-19 & Influenza A/B: negative  ABG: pending post intubation  CXR 11/19/23: no active disease CT head wo contrast 11/19/23: No evidence of acute intracranial abnormality. Stable multifocal encephalomalacia, likely the sequela of prior trauma. Similar age-advanced cerebral atrophy CT angio chest PE 11/19/23:  No pulmonary embolus. Near complete filling of the right mainstem bronchus with filling extending into the bronchus intermedius, right upper, middle and lower lobe bronchi. Layering debris in the trachea. Findings are highly suspicious for aspiration. Minimal ill-defined patchy and nodular airspace disease in the dependent right lower lobe, likely postobstructive pneumonia.  PCCM consulted for assistance in management and monitoring due to acute hypoxic respiratory failure requiring urgent intubation and mechanical ventilatory support secondary to suspected aspiration and pneumonia.  Pertinent  Medical History  Seizures TBI (09/2019)  Significant Hospital Events: Including procedures, antibiotic start and stop dates in addition to other pertinent events   11/20/23: Admit to ICU due to acute hypoxic respiratory failure requiring urgent intubation  and mechanical ventilatory support secondary to suspected aspiration and pneumonia  Interim History / Subjective:  Patient somnolent upon arrival bedside, SpO2 in the  60's on a NRB. Emergent intubation performed. Mother updated via telephone, plan of care discussed, all questions and concerns answered at this time.  Objective   Blood pressure 113/80, pulse 85, temperature 97.9 F (36.6 C), temperature source Rectal, resp. rate 16, height 5\' 9"  (1.753 m), weight 69.5 kg, SpO2 100%.        Intake/Output Summary (Last 24 hours) at 11/20/2023 0143 Last data filed at 11/19/2023 0981 Gross per 24 hour  Intake 1599.1 ml  Output --  Net 1599.1 ml   Filed Weights   11/19/23 1542  Weight: 69.5 kg    Examination: General: Adult male, critically ill, lying in bed intubated & sedated requiring mechanical ventilation, NAD HEENT: MM pink/moist, anicteric, atraumatic, neck supple Neuro: RASS: -4, unable to follow commands, PERRL +3, moving RUE/RLE (baseline) CV: s1s2 RRR, ST on monitor, no r/m/g Pulm: Regular, labored on NRB, breath sounds coarse-BUL & diminished-BLL GI: soft, rounded, non tender, bs x 4 Skin: no rashes/lesions noted Extremities: warm/dry, pulses + 2 R/P, no edema noted  Resolved Hospital Problem list     Assessment & Plan:  Acute Hypoxic Respiratory Failure secondary to suspected aspiration and possible post obstructive pneumonia in the setting of TBI - Ventilator settings: PRVC  8 mL/kg, 100% FiO2, 8 PEEP, continue ventilator support & lung protective strategies - Wean PEEP & FiO2 as tolerated, maintain SpO2 > 90% - Head of bed elevated 30 degrees, VAP protocol in place - Plateau pressures less than 30 cm H20  - Intermittent chest x-ray & ABG PRN - Daily WUA with SBT as tolerated  - Ensure adequate pulmonary hygiene  - F/u cultures, trend PCT - Initiate Aspiration Pna coverage - bronchodilators PRN - PAD protocol in place: continue Fentanyl IVP & Precedex drip - consider bronchoscopy PRN  Mild Hypokalemia - K+ supplementation ordered - Daily BMP, replace electrolytes PRN  Suspected Aspiration with possible post-obstructive  pneumonia Initial interventions/workup included: 2 L of NS/LR & Cefepime/ Vancomycin/ Metronidazole - f/u cultures, trend lactic/ PCT - Daily CBC, monitor WBC/ fever curve - IV antibiotics: azithromycin & unasyn - IVF hydration as needed: continue LR infusion - Consider vasopressors to maintain MAP< 65: norepinephrine - Strict I/O's: alert provider if UOP < 0.5 mL/kg/hr - continuous cardiac monitoring - aspiration precautions  Chronic Seizure Disorder Recent valproic acid level was elevated. - f/u valproic acid & lacosamide levels - continue outpatient regimen: valproic acid & lacosamide for now - monitor neurological status - seizure precautions - consider neurology consultation if lab values are abnormal for medication guidance  Chronic TBI - baclofen PRN continued, hold gabapentin for now - consider restarting outpatient Seroquel depending on EKG - supportive care  Best Practice (right click and "Reselect all SmartList Selections" daily)  Diet/type: NPO w/ meds via tube DVT prophylaxis LMWH Pressure ulcer(s): N/A GI prophylaxis: H2B Lines: N/A Foley:  Yes, and it is still needed Code Status:  full code Last date of multidisciplinary goals of care discussion [11/20/23]  Labs   CBC: Recent Labs  Lab 11/19/23 1655  WBC 4.3  NEUTROABS 2.8  HGB 14.3  HCT 41.9  MCV 92.1  PLT 214    Basic Metabolic Panel: Recent Labs  Lab 11/19/23 1941  NA 138  K 3.4*  CL 104  CO2 26  GLUCOSE 107*  BUN 13  CREATININE 0.65  CALCIUM 7.5*  GFR: Estimated Creatinine Clearance: 135.1 mL/min (by C-G formula based on SCr of 0.65 mg/dL). Recent Labs  Lab 11/19/23 1655 11/19/23 1656 11/19/23 1935  WBC 4.3  --   --   LATICACIDVEN  --  1.1 1.4    Liver Function Tests: Recent Labs  Lab 11/19/23 1941  AST 21  ALT 9  ALKPHOS 47  BILITOT 1.0  PROT 5.6*  ALBUMIN 2.6*   No results for input(s): "LIPASE", "AMYLASE" in the last 168 hours. No results for input(s): "AMMONIA"  in the last 168 hours.  ABG    Component Value Date/Time   PHART 7.44 04/10/2022 1610   PCO2ART 35 04/10/2022 1610   PO2ART 131 (H) 04/10/2022 1610   HCO3 23.8 04/10/2022 1610   TCO2 28 05/31/2021 0404   ACIDBASEDEF 2.8 (H) 01/13/2021 0108   O2SAT 99.6 04/10/2022 1610     Coagulation Profile: Recent Labs  Lab 11/19/23 1940  INR 1.2    Cardiac Enzymes: No results for input(s): "CKTOTAL", "CKMB", "CKMBINDEX", "TROPONINI" in the last 168 hours.  HbA1C: Hgb A1c MFr Bld  Date/Time Value Ref Range Status  04/11/2022 06:10 AM 5.1 4.8 - 5.6 % Final    Comment:    (NOTE) Pre diabetes:          5.7%-6.4%  Diabetes:              >6.4%  Glycemic control for   <7.0% adults with diabetes   05/31/2021 04:18 PM 4.9 4.8 - 5.6 % Final    Comment:    (NOTE) Pre diabetes:          5.7%-6.4%  Diabetes:              >6.4%  Glycemic control for   <7.0% adults with diabetes     CBG: No results for input(s): "GLUCAP" in the last 168 hours.  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   Surgical History:   Past Surgical History:  Procedure Laterality Date   GASTROSTOMY TUBE PLACEMENT       Social History:   reports that he has never smoked. He has never used smokeless tobacco. He reports that he does not currently use alcohol. He reports that he does not use drugs.   Family History:  His family history is not on file.   Allergies No Known Allergies   Home Medications  Prior to Admission medications   Medication Sig Start Date End Date Taking? Authorizing Provider  melatonin 5 MG TABS Take 10 mg by mouth at bedtime.   Yes [provider]  QUEtiapine (SEROQUEL XR) 200 MG 24 hr tablet Take 200 mg by mouth in the morning.   Yes  [provider]  QUEtiapine (SEROQUEL) 25 MG tablet Take 25 mg by mouth at bedtime.   Yes [provider]  valproic acid (DEPAKENE) 250 MG capsule Take 750 mg by mouth every evening.   Yes [provider]  acetaminophen (TYLENOL) 160 MG/5ML solution Place 20.3 mLs (650 mg total) into feeding tube every 6 (six) hours as needed for mild pain, moderate pain, headache or fever. Patient not taking: Reported on 09/12/2021 06/06/21   Rolly Salter, MD  baclofen (LIORESAL) 10 MG tablet Take 10 mg by mouth 3 (three) times daily.  Yes [provider]  diazePAM, 15 MG Dose, (VALTOCO 15 MG DOSE) 2 x 7.5 MG/0.1ML LQPK Spray 7.5 mg into each nostril in the event of a seizure 04/20/22  Yes Zigmund Daniel., MD  gabapentin (NEURONTIN) 400 MG capsule Take 1 capsule (400 mg total) by mouth 3 (three) times daily. 04/20/22 11/20/23 Yes Zigmund Daniel., MD  lacosamide 100 MG TABS Take 1 tablet (100 mg total) by mouth 2 (two) times daily. 04/20/22 11/20/23 Yes Zigmund Daniel., MD  melatonin 3 MG TABS tablet Place 2 tablets (6 mg total) into feeding tube at bedtime. Patient not taking: Reported on 11/20/2023 06/04/21   Rolly Salter, MD  polyethylene glycol (MIRALAX / GLYCOLAX) 17 g packet Place 17 g into feeding tube daily. Patient taking differently: Take 17 g by mouth daily. 06/04/21  Yes Rolly Salter, MD  QUEtiapine (SEROQUEL) 100 MG tablet Take 100 mg by mouth daily. Patient not taking: Reported on 11/20/2023    [provider]  senna (SENOKOT) 8.6 MG TABS tablet Take 8.6 mg by mouth at bedtime. Patient not taking: Reported on 11/20/2023    [provider]  triamcinolone ointment (KENALOG) 0.1 % Apply topically 2 (two) times daily. Patient not taking: Reported on 09/12/2021 06/03/21   Rolly Salter, MD  valproic acid (DEPAKENE) 250 MG capsule Take 4 capsules (1,000 mg total) by mouth 2 (two) times daily. Patient taking differently: Take 500 mg  by mouth every morning. 04/20/22 11/20/23 Yes Zigmund Daniel., MD     Critical care time: 68 minutes      Betsey Holiday, AGACNP-BC Acute Care Nurse Practitioner [facility]   [phone] / [phone] Please see Amion for details.

## 2023-11-20 NOTE — Progress Notes (Signed)
Alerted by nursing to small amount of bright red blood via OGT. Upon bedside assessment, very small amount of bright red blood in OGT tubing. - changed Pepcid to PPI BID - changed seizure medications to IV route - reassured that the patient's BP is more stable and levophed is being titrated down, monitor for any additional bleeding or increasing hemodynamic instability - f/u CBC   Betsey Holiday, AGACNP-BC Acute Care Nurse Practitioner [facility]   [phone] / [phone] Please see Amion for details.
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
## 2023-11-20 NOTE — Plan of Care (Signed)
  Problem: Fluid Volume: Goal: Hemodynamic stability will improve Outcome: Progressing   Problem: Clinical Measurements: Goal: Diagnostic test results will improve Outcome: Progressing Goal: Signs and symptoms of infection will decrease Outcome: Progressing   Problem: Respiratory: Goal: Ability to maintain adequate ventilation will improve Outcome: Progressing   Problem: Activity: Goal: Ability to tolerate increased activity will improve Outcome: Progressing   Problem: Respiratory: Goal: Ability to maintain a clear airway and adequate ventilation will improve Outcome: Progressing   Problem: Role Relationship: Goal: Method of communication will improve Outcome: Progressing   Problem: Education: Goal: Knowledge of General Education information will improve Description: Including pain rating scale, medication(s)/side effects and non-pharmacologic comfort measures Outcome: Progressing   Problem: Health Behavior/Discharge Planning: Goal: Ability to manage health-related needs will improve Outcome: Progressing   Problem: Clinical Measurements: Goal: Ability to maintain clinical measurements within normal limits will improve Outcome: Progressing Goal: Will remain free from infection Outcome: Progressing Goal: Diagnostic test results will improve Outcome: Progressing Goal: Respiratory complications will improve Outcome: Progressing Goal: Cardiovascular complication will be avoided Outcome: Progressing   Problem: Activity: Goal: Risk for activity intolerance will decrease Outcome: Progressing   Problem: Nutrition: Goal: Adequate nutrition will be maintained Outcome: Progressing   Problem: Coping: Goal: Level of anxiety will decrease Outcome: Progressing   Problem: Elimination: Goal: Will not experience complications related to bowel motility Outcome: Progressing Goal: Will not experience complications related to urinary retention Outcome: Progressing   Problem:  Pain Managment: Goal: General experience of comfort will improve and/or be controlled Outcome: Progressing   Problem: Safety: Goal: Ability to remain free from injury will improve Outcome: Progressing   Problem: Skin Integrity: Goal: Risk for impaired skin integrity will decrease Outcome: Progressing

## 2023-11-20 NOTE — Assessment & Plan Note (Deleted)
-   This is secondary to his aspiration pneumonia and hypoxemia. - Management as above. - Will monitor mental status.

## 2023-11-20 NOTE — ED Notes (Signed)
In to administer meds via OG tube, noticed bright red blood in suction tubing. NP aware.

## 2023-11-20 NOTE — Procedures (Signed)
Intubation Procedure Note  Alex [name]  [id number]  03/15/1996  Date:11/20/23  Time:1:41 AM   Provider Performing:Alvia Tory L Rust-Chester    Procedure: Intubation (31500)  Indication(s) Respiratory Failure  Consent Unable to obtain consent due to emergent nature of procedure.   Anesthesia Etomidate, Versed, Fentanyl, and Rocuronium   Time Out Verified patient identification, verified procedure, site/side was marked, verified correct patient position, special equipment/implants available, medications/allergies/relevant history reviewed, required imaging and test results available.   Sterile Technique Usual hand hygeine, masks, and gloves were used   Procedure Description Patient positioned in bed supine.  Sedation given as noted above.  Patient was intubated with endotracheal tube using Glidescope.  View was Grade 1 full glottis .  Number of attempts was 1.  Colorimetric CO2 detector was consistent with tracheal placement.   Complications/Tolerance None; patient tolerated the procedure well. Chest X-ray is ordered to verify placement.   EBL Minimal   Specimen(s) None   Betsey Holiday, AGACNP-BC Acute Care Nurse Practitioner [facility]   [phone] / [phone] Please see Amion for details.

## 2023-11-20 NOTE — ED Notes (Signed)
Noticed IV infiltrated. Called pharmacy to notify. NP also aware. Charge nurse at bedside. Pharmacy sending antidote

## 2023-11-20 NOTE — Progress Notes (Addendum)
Critical care note:  Date of note: 11/20/2023.  Subjective: Within 2 hours from his admission the patient became acutely hypoxic with pulse currently in the 70s despite increasing FiO2 from 4 to 6 L by nasal cannula.  RRT was called and patient was placed on 100% nonrebreather.  The patient also became hypotensive with a BP of 80/55 and was ordered a wide open 1 L bolus of IV normal saline.  Despite above management the patient had persistent hypotension and hypoxemia.  Objective: Physical examination: Generally: Acutely ill unresponsive young Caucasian male in moderate to severe respiratory distress. Vital signs: BP was 81/60 and later 95/56 with heart rate 96 impulsivity 74% on 100% nonrebreather.  Temperature was 97.9 earlier. Head - atraumatic, normocephalic.  Pupils - equal, round and reactive to light and accommodation. Extraocular movements are intact. No scleral icterus.  Oropharynx - moist mucous membranes and tongue. No pharyngeal erythema or exudate.  Neck - supple. No JVD. Carotid pulses 2+ bilaterally. No carotid bruits. No palpable thyromegaly or lymphadenopathy. Cardiovascular - regular rate and rhythm. Normal S1 and S2. No murmurs, gallops or rubs.  Lungs -diminished bibasal and right midlung zone breath sounds with right basal and midlung zone crackles.  Diffusely coarse breath sounds. Abdomen - soft and nontender. Positive bowel sounds. No palpable organomegaly or masses.  Extremities - no pitting edema, clubbing or cyanosis.  Neuro - grossly non-focal. Skin - no rashes. GU and rectal exam - deferred.    Assessment/plan: 1.  Acute respiratory failure with hypoxia with associated acute encephalopathy, requiring intubation and mechanical and ventilation.  This is secondary to significant aspiration right-sided pneumonia with associated traumatic brain injury and subsequent septic shock requiring vasopressor therapy. - The patient was given RSI and intubated by the ICU team. -  He was started on IV Levophed for persistent hypotension. - Will be admitted to an ICU bed for further evaluation and management. - PCCM will be assuming care. - I attempted contacting his mother and legal guardian with no response and left her message to call me. -He will need suction as needed.   Authorized and performed by: Valente David, MD Total critical care time:  30      minutes. Due to a high probability of clinically significant, life-threatening deterioration, the patient required my highest level of preparedness to intervene emergently and I personally spent this critical care time directly and personally managing the patient.  This critical care time included obtaining a history, examining the patient, pulse oximetry, ordering and review of studies, arranging urgent treatment with development of management plan, evaluation of patient's response to treatment, frequent reassessment, and discussions with other providers. This critical care time was performed to assess and manage the high probability of imminent, life-threatening deterioration that could result in multiorgan failure.  It was exclusive of separately billable procedures and treating other patients and teaching time.

## 2023-11-20 NOTE — ED Notes (Signed)
Central line placement complete. Verified placement with xray. Drips and tubing switched and moved to central line

## 2023-11-20 NOTE — Assessment & Plan Note (Addendum)
 Completed antibiotics.  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  Completed course of Zyvox and Zosyn.

## 2023-11-20 NOTE — ED Notes (Signed)
Pt intubated by NP. +color change noted. 25@lip . 7.5. 30F OG tube placed 60@ lip

## 2023-11-20 NOTE — Progress Notes (Signed)
Patient was being administered medications when he began to bite down on the tube. Administered O 2 breaths via the ventilator with no improvement. Called respiratory to come assist. Administered PRN Versed 2 mg IV with no improvement. Gave 50 mcg IV push of fentanyl via bag currently infusing and increased rate to 50 mcg/hr or 5 ml/hr. Increased Levophed due to these administrations. Patient still biting on tube so called Britton-Lee, NP and this RN placed a bite block. Britton-Lee stated to keep bite block in the patient's mouth. Respiratory Bob taped bite block to tape securing the ETT. Patient currently relaxing and no long biting tube. Family updated at bedside. Bed in the lowest position, call bell within reach side rails up X2, vital signs stable, mitten on right hand still in place, will continue to monitor.

## 2023-11-20 NOTE — Progress Notes (Signed)
   11/20/23 0104  Spiritual Encounters  Type of Visit Initial  Care provided to: Pt not available  Conversation partners present during encounter Nurse  Referral source Code page  Reason for visit Code  OnCall Visit Yes  Interventions  Spiritual Care Interventions Made Compassionate presence  Spiritual Care Plan  Spiritual Care Issues Still Outstanding Referring to oncoming chaplain for further support

## 2023-11-20 NOTE — Assessment & Plan Note (Addendum)
 Resolved completed antibiotics.  Still on midodrine for blood pressure.  White blood cell count normal range.

## 2023-11-20 NOTE — Procedures (Signed)
Central Venous Catheter Insertion Procedure Note  ARLO BUTT  [id number]  [birth year]  Date:11/20/23  Time:4:47 AM   Provider Performing:Dennice Tindol L Rust-Chester   Procedure: Insertion of Non-tunneled Central Venous Catheter(36556) with US guidance (36644)   Indication(s) Medication administration and Difficult access  Consent Unable to obtain consent due to emergent nature of procedure. Attempted to reach mother, left voicemail. IV infiltrated, only other access in knuckle. Additional access needed emergently.  Anesthesia Topical only with 1% lidocaine , precedex drip running  Timeout Verified patient identification, verified procedure, site/side was marked, verified correct patient position, special equipment/implants available, medications/allergies/relevant history reviewed, required imaging and test results available.  Sterile Technique Maximal sterile technique including full sterile barrier drape, hand hygiene, sterile gown, sterile gloves, mask, hair covering, sterile ultrasound probe cover (if used).  Procedure Description Area of catheter insertion was cleaned with chlorhexidine and draped in sterile fashion.  With real-time ultrasound guidance a central venous catheter was placed into the right internal jugular vein. Nonpulsatile blood flow and easy flushing noted in all ports.  The catheter was sutured in place and sterile dressing applied.  Complications/Tolerance None; patient tolerated the procedure well. Chest X-ray is ordered to verify placement for internal jugular or subclavian cannulation.   Chest x-ray is not ordered for femoral cannulation.  EBL Minimal  Specimen(s) None   Betsey Holiday, AGACNP-BC Acute Care Nurse Practitioner [facility]   [phone] / [phone] Please see Amion for details.

## 2023-11-20 NOTE — Assessment & Plan Note (Addendum)
 Replaced

## 2023-11-20 NOTE — Progress Notes (Signed)
   11/20/23 0850  Spiritual Encounters  Type of Visit Follow up  Care provided to: Patient (Pt. is unable to respond)  Conversation partners present during encounter Nurse  Referral source Chaplain team  Reason for visit Routine spiritual support  OnCall Visit No  Interventions  Spiritual Care Interventions Made Compassionate presence  Intervention Outcomes  Outcomes Other (comment) (Pt unable to respond)

## 2023-11-20 NOTE — ED Notes (Signed)
RT Arm wrapped and warm heel compresses applied. Elevated using 2 pillows to approx 4 inches above heart

## 2023-11-20 NOTE — Significant Event (Signed)
Rapid Response Event Note   Reason for Call :  Resp Failure   Initial Focused Assessment:  Patient on NRB O2 60% and minimally responsive. MD Mansy and NP Rust-Chester at bedside preparing for intubation.  BP 95/56 Hr 103 O2 74%  on NRB.   Interventions:  - Levophed started at 5 mcg prior to intubation drugs being given per NP Rust-Chester  -PT intubated. -OG placed -CXR and Labs ordered. MD Notified: MD Mansy  Call Time:0104 Arrival Time:0107 End Time:0140  Judyann Munson, RN

## 2023-11-21 ENCOUNTER — Inpatient Hospital Stay: Payer: Medicaid Other

## 2023-11-21 LAB — CBC
HCT: 31.6 % — ABNORMAL LOW (ref 39.0–52.0)
Hemoglobin: 11.1 g/dL — ABNORMAL LOW (ref 13.0–17.0)
MCH: 31 pg (ref 26.0–34.0)
MCHC: 35.1 g/dL (ref 30.0–36.0)
MCV: 88.3 fL (ref 80.0–100.0)
Platelets: 255 10*3/uL (ref 150–400)
RBC: 3.58 MIL/uL — ABNORMAL LOW (ref 4.22–5.81)
RDW: 11.9 % (ref 11.5–15.5)
WBC: 7.5 10*3/uL (ref 4.0–10.5)
nRBC: 0 % (ref 0.0–0.2)

## 2023-11-21 LAB — PROCALCITONIN: Procalcitonin: 0.17 ng/mL

## 2023-11-21 LAB — BASIC METABOLIC PANEL
Anion gap: 7 (ref 5–15)
BUN: <5 mg/dL — ABNORMAL LOW (ref 6–20)
CO2: 26 mmol/L (ref 22–32)
Calcium: 7.8 mg/dL — ABNORMAL LOW (ref 8.9–10.3)
Chloride: 103 mmol/L (ref 98–111)
Creatinine, Ser: 0.51 mg/dL — ABNORMAL LOW (ref 0.61–1.24)
GFR, Estimated: >60 mL/min (ref >60)
Glucose, Bld: 125 mg/dL — ABNORMAL HIGH (ref 70–99)
Potassium: 3.2 mmol/L — ABNORMAL LOW (ref 3.5–5.1)
Sodium: 136 mmol/L (ref 135–145)

## 2023-11-21 LAB — PHOSPHORUS: Phosphorus: 2.1 mg/dL — ABNORMAL LOW (ref 2.5–4.6)

## 2023-11-21 LAB — GLUCOSE, CAPILLARY
Glucose-Capillary: 101 mg/dL — ABNORMAL HIGH (ref 70–99)
Glucose-Capillary: 105 mg/dL — ABNORMAL HIGH (ref 70–99)
Glucose-Capillary: 106 mg/dL — ABNORMAL HIGH (ref 70–99)
Glucose-Capillary: 109 mg/dL — ABNORMAL HIGH (ref 70–99)
Glucose-Capillary: 133 mg/dL — ABNORMAL HIGH (ref 70–99)
Glucose-Capillary: 88 mg/dL (ref 70–99)
Glucose-Capillary: 97 mg/dL (ref 70–99)

## 2023-11-21 LAB — MAGNESIUM: Magnesium: 1.8 mg/dL (ref 1.7–2.4)

## 2023-11-21 MED ORDER — PROPOFOL 1000 MG/100ML IV EMUL
5.0000 ug/kg/min | INTRAVENOUS | Status: DC
  Administered 2023-11-21 – 2023-11-22 (×2): 25 ug/kg/min via INTRAVENOUS
  Administered 2023-11-22: 35 ug/kg/min via INTRAVENOUS
  Administered 2023-11-22: 5 ug/kg/min via INTRAVENOUS
  Administered 2023-11-23: 45 ug/kg/min via INTRAVENOUS
  Administered 2023-11-23 (×2): 35 ug/kg/min via INTRAVENOUS
  Administered 2023-11-23: 45 ug/kg/min via INTRAVENOUS
  Administered 2023-11-24: 35 ug/kg/min via INTRAVENOUS
  Administered 2023-11-24: 40 ug/kg/min via INTRAVENOUS
  Administered 2023-11-24: 45 ug/kg/min via INTRAVENOUS
  Administered 2023-11-24 – 2023-11-25 (×2): 40 ug/kg/min via INTRAVENOUS
  Filled 2023-11-21 (×16): qty 100

## 2023-11-21 MED ORDER — POTASSIUM PHOSPHATES 15 MMOLE/5ML IV SOLN
30.0000 mmol | Freq: Once | INTRAVENOUS | Status: AC
  Administered 2023-11-21: 30 mmol via INTRAVENOUS
  Filled 2023-11-21: qty 10

## 2023-11-21 MED ORDER — POTASSIUM CHLORIDE 20 MEQ PO PACK
40.0000 meq | PACK | Freq: Once | ORAL | Status: AC
  Administered 2023-11-21: 40 meq
  Filled 2023-11-21: qty 2

## 2023-11-21 MED ORDER — MUPIROCIN 2 % EX OINT
1.0000 | TOPICAL_OINTMENT | Freq: Two times a day (BID) | CUTANEOUS | Status: AC
  Administered 2023-11-21 – 2023-11-26 (×10): 1 via NASAL
  Filled 2023-11-21: qty 22

## 2023-11-21 MED ORDER — VALPROATE SODIUM 100 MG/ML IV SOLN
750.0000 mg | INTRAVENOUS | Status: DC
  Administered 2023-11-21 – 2023-11-22 (×2): 750 mg via INTRAVENOUS
  Filled 2023-11-21 (×3): qty 7.5

## 2023-11-21 MED ORDER — FUROSEMIDE 10 MG/ML IJ SOLN
40.0000 mg | Freq: Once | INTRAMUSCULAR | Status: AC
Start: 2023-11-21 — End: 2023-11-21
  Administered 2023-11-21: 40 mg via INTRAVENOUS
  Filled 2023-11-21: qty 4

## 2023-11-21 MED ORDER — CHLORHEXIDINE GLUCONATE CLOTH 2 % EX PADS
6.0000 | MEDICATED_PAD | Freq: Every evening | CUTANEOUS | Status: DC
  Administered 2023-11-21 – 2023-12-05 (×15): 6 via TOPICAL

## 2023-11-21 MED ORDER — PROPOFOL 1000 MG/100ML IV EMUL
INTRAVENOUS | Status: AC
  Administered 2023-11-21: 5 ug/kg/min via INTRAVENOUS
  Filled 2023-11-21: qty 100

## 2023-11-21 MED ORDER — LACTATED RINGERS IV BOLUS
1000.0000 mL | Freq: Once | INTRAVENOUS | Status: AC
Start: 2023-11-21 — End: 2023-11-21
  Administered 2023-11-21: 1000 mL via INTRAVENOUS

## 2023-11-21 MED ORDER — METRONIDAZOLE 500 MG/100ML IV SOLN
500.0000 mg | Freq: Two times a day (BID) | INTRAVENOUS | Status: DC
Start: 2023-11-21 — End: 2023-11-24
  Administered 2023-11-21 – 2023-11-24 (×6): 500 mg via INTRAVENOUS
  Filled 2023-11-21 (×7): qty 100

## 2023-11-21 MED ORDER — SODIUM CHLORIDE 0.9 % IV SOLN
100.0000 mg | Freq: Two times a day (BID) | INTRAVENOUS | Status: DC
  Administered 2023-11-21 – 2023-11-26 (×11): 100 mg via INTRAVENOUS
  Filled 2023-11-21 (×14): qty 100
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
## 2023-11-21 NOTE — Procedures (Signed)
PROCEDURE: BRONCHOSCOPY Therapeutic Aspiration of Tracheobronchial Tree Fiberoptic bronchoscopy with bronchoalveolar lavage  PROCEDURE DATE: 11/21/2023  TIME:  NAME:  Alex [name]  DOB:[birth year]  MRN: [id number] LOC:  IC09A/IC09A-AA    HOSP DAY: @LENGTHOFSTAYDAYS @ CODE STATUS:      Code Status Orders  (From admission, onward)           Start     Ordered   11/20/23 0141  Full code  Continuous       Question:  By:  Answer:  Consent: discussion documented in EHR   11/20/23 0141           Code Status History     Date Active Date Inactive Code Status Order ID Comments User Context   11/19/2023 2322 11/20/2023 0141 Full Code [id number]  Mansy, Vernetta Honey, MD ED   04/10/2022 2314 04/21/2022 0207 Full Code [id number]  Gleason, Markus Jarvis Inpatient   09/11/2021 2328 09/14/2021 0207 Full Code [id number]  Mansy, Vernetta Honey, MD ED   05/31/2021 0107 06/07/2021 0111 Full Code [id number]  Marcelle Smiling, MD Inpatient           Indications/Preliminary Diagnosis:   Consent: (Place X beside choice/s below)  The benefits, risks and possible complications of the procedure were        explained to:  ___ patient  _x_ patient's family  ___ other:___________  who verbalized understanding and gave:  ___ verbal  _x__ written  ___ verbal and written  ___ telephone  ___ other:________ consent.      Unable to obtain consent; procedure performed on emergent basis.     Other:       PRESEDATION ASSESSMENT: History and Physical has been performed. Patient meds and allergies have been reviewed. Presedation airway examination has been performed and documented. Baseline vital signs, sedation score, oxygenation status, and cardiac rhythm were reviewed. Patient was deemed to be in satisfactory condition to undergo the procedure.    PREMEDICATIONS:   Sedative/Narcotic Amt Dose   Versed  mg   Fentanyl gtt mcg  Diprivan gtt mg            PROCEDURE DETAILS: Timeout performed and correct patient,  name, & ID confirmed. Following prep per Pulmonary policy, appropriate sedation was administered. The Bronchoscope was inserted in to oral cavity with bite block in place. Therapeutic aspiration of Tracheobronchial tree was performed.  Airway exam proceeded with findings, technical procedures, and specimen collection as noted below. At the end of exam the scope was withdrawn without incident. Impression and Plan as noted below.           Airway Prep (Place X beside choice below)   1% Transtracheal Lidocaine Anesthetization 7 cc   Patient prepped per Bronchoscopy Lab Policy       Insertion Route (Place X beside choice below)   Nasal   Oral  x Endotracheal Tube   Tracheostomy   INTRAPROCEDURE MEDICATIONS:  Sedative/Narcotic Amt Dose   Versed  mg   Fentanyl gtt mcg  Diprivan gtt mg       Medication Amt Dose  Medication Amt Dose  Lidocaine 1%  cc  Epinephrine 1:10,000 sol  cc  Xylocaine 4%  cc  Cocaine  cc   TECHNICAL PROCEDURES: (Place X beside choice below)   Procedures  Description    None     Electrocautery     Cryotherapy     Balloon Dilatation     Bronchography     Stent Placement   x  Therapeutic Aspiration RLL, RML    Laser/Argon Plasma    Brachytherapy Catheter Placement    Foreign Body Removal         SPECIMENS (Sites): (Place X beside choice below)  Specimens Description   No Specimens Obtained     Washings   x Lavage RML   Biopsies    Fine Needle Aspirates    Brushings    Sputum    FINDINGS:  ESTIMATED BLOOD LOSS: none COMPLICATIONS/RESOLUTION: none      IMPRESSION:POST-PROCEDURE DX:   Mucus plugging of RML and RLL was evacuated with therapeutic aspiration of tracheobronchial tree.   BAL of RML was sent to microbiology for gram stain and culture  RECOMMENDATION/PLAN:   Awaiting BAL cultures    Vida Rigger, M.D.  Pulmonary & Critical Care Medicine  [facility]

## 2023-11-21 NOTE — Progress Notes (Signed)
NAME:  Alex [name], MRN:  [id number], DOB:  1996/05/09, LOS: 2 ADMISSION DATE:  11/19/2023, CONSULTATION DATE:  11/20/23 REFERRING MD:  Dr. Arville Care, CHIEF COMPLAINT:  AMS   History of Present Illness:  28 yo M presenting to [facility] ED from outpatient rehab on 11/19/23 for evaluation of altered mental status.  History obtained per chart review and mother's telephone interview, patient unable to participate in interview due to respiratory distress and baseline TBI. 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 He was treated at [facility] for 5 months then discharged to rehab. Per his mother and legal guardian his baseline is: Non verbal but he will yell out sporadic nonsensical words with left sided paralysis. He is able to move his RUE in order to feed himself with finger foods and he has some involuntary movement with that arm, he will swat at you. Similar baseline function with RLE, he can kick and move, but often will lay it bent and to the side. He has enough strength to even try and get out of bed on the right side. She denies any issues with swallowing, but eats mostly soft food. If he is not watched closely with eating he will try to eat all the food at once. The facility staff reported he was at his normal baseline until lunchtime on 11/19/23. It was observed he was more somnolent and not interactive. Staff noted a cough and slightly increased work of breathing. Mom also confirmed noting a congested cough the last time she visited earlier in the week. Staff and mom denied nausea/ vomiting, but mom reports chronic loose stools.  EMS reported the patient febrile and tachycardic on arrival.  ED course: Upon arrival patient tachycardic and lethargic. Sepsis protocol initiated with antibiotics and IVF resuscitation. Labs significant for mild hypokalemia, otherwise WNL. Initial imaging unremarkable but then patient became hypoxic with SpO2 85% on RA and a CTa was obtained, negative  for PE but concerning for aspiration pneumonia. TRH consulted for admission. While patient was pending admission in ED he became increasingly hypoxic in the 60's, placed on a NRB without improvement. PCCM alerted the need to emergently intubate the patient. Medications given: Cefepime, Flagyl, Vancomycin, 2 L IVF bolus, IV contrast Initial Vitals: 97.9, 17, 130, 119/82 & 92 on RA Significant labs: (Labs/ Imaging personally reviewed) EKG : pending Chemistry: Na+: 138, K+: 3.4, BUN/Cr.: 13/ 0.65, Serum CO2/ AG: 26/8 Hematology: WBC: 4.3, Hgb: 14.3,  Lactic/ PCT: 1.1 > 1.4 / pending, COVID-19 & Influenza A/B: negative  ABG: pending post intubation  CXR 11/19/23: no active disease CT head wo contrast 11/19/23: No evidence of acute intracranial abnormality. Stable multifocal encephalomalacia, likely the sequela of prior trauma. Similar age-advanced cerebral atrophy CT angio chest PE 11/19/23:  No pulmonary embolus. Near complete filling of the right mainstem bronchus with filling extending into the bronchus intermedius, right upper, middle and lower lobe bronchi. Layering debris in the trachea. Findings are highly suspicious for aspiration. Minimal ill-defined patchy and nodular airspace disease in the dependent right lower lobe, likely postobstructive pneumonia.  PCCM consulted for assistance in management and monitoring due to acute hypoxic respiratory failure requiring urgent intubation and mechanical ventilatory support secondary to suspected aspiration and pneumonia.  Pertinent  Medical History  Seizures TBI (09/2019)  Significant Hospital Events: Including procedures, antibiotic start and stop dates in addition to other pertinent events   11/20/23: Admit to ICU due to acute hypoxic respiratory failure requiring urgent intubation  and mechanical ventilatory support secondary to suspected aspiration and pneumonia 11/21/23- patient moving RUE, mother at bedside we reviewed medical plan. He remains on  72mcg/kg/hr levophed, today plan to rescusitate more aggresively and wean from levophed and potentially extubate post SBP.   He is febrile this am.  He is on zithromax, unasyn    Objective   Blood pressure (!) 101/58, pulse 84, temperature (!) 101.7 F (38.7 C), resp. rate 16, height 5\' 9"  (1.753 m), weight 69.4 kg, SpO2 100%.    Vent Mode: PRVC FiO2 (%):  [40 %] 40 % Set Rate:  [16 bmp] 16 bmp Vt Set:  [550 mL] 550 mL PEEP:  [8 cmH20] 8 cmH20 Plateau Pressure:  [20 cmH20] 20 cmH20   Intake/Output Summary (Last 24 hours) at 11/21/2023 7829 Last data filed at 11/21/2023 0400 Gross per 24 hour  Intake 4730.71 ml  Output 3580 ml  Net 1150.71 ml   Filed Weights   11/19/23 1542 11/21/23 0422  Weight: 69.5 kg 69.4 kg    Examination: General: Adult male, critically ill, lying in bed intubated & sedated requiring mechanical ventilation, NAD HEENT: MM pink/moist, anicteric, atraumatic, neck supple Neuro: RASS: -4, unable to follow commands, PERRL +3, moving RUE/RLE (baseline) CV: s1s2 RRR, ST on monitor, no r/m/g Pulm: Regular, labored on NRB, breath sounds coarse-BUL & diminished-BLL GI: soft, rounded, non tender, bs x 4 Skin: no rashes/lesions noted Extremities: warm/dry, pulses + 2 R/P, no edema noted  Resolved Hospital Problem list     Assessment & Plan:  Acute Hypoxic Respiratory Failure secondary to suspected aspiration and possible post obstructive pneumonia in the setting of TBI - Ventilator settings: PRVC  8 mL/kg, 100% FiO2, 8 PEEP, continue ventilator support & lung protective strategies - Wean PEEP & FiO2 as tolerated, maintain SpO2 > 90% - Head of bed elevated 30 degrees, VAP protocol in place - Plateau pressures less than 30 cm H20  - Intermittent chest x-ray & ABG PRN - Daily WUA with SBT as tolerated  - Ensure adequate pulmonary hygiene  - F/u cultures, trend PCT - Initiate Aspiration Pna coverage - bronchodilators PRN - PAD protocol in place: continue  Fentanyl IVP & Precedex drip -possible bronchoscopy prior to extubation  Mild Hypokalemia - K+ supplementation ordered - Daily BMP, replace electrolytes PRN  Suspected Aspiration with possible post-obstructive pneumonia Initial interventions/workup included: 2 L of NS/LR & Cefepime/ Vancomycin/ Metronidazole - f/u cultures, trend lactic/ PCT - Daily CBC, monitor WBC/ fever curve - IV antibiotics: azithromycin & unasyn - IVF hydration as needed: continue LR infusion - Consider vasopressors to maintain MAP< 65: norepinephrine - Strict I/O's: alert provider if UOP < 0.5 mL/kg/hr - continuous cardiac monitoring - aspiration precautions  Chronic Seizure Disorder Recent valproic acid level was elevated. - f/u valproic acid & lacosamide levels - continue outpatient regimen: valproic acid & lacosamide for now - monitor neurological status - seizure precautions  Chronic TBI - baclofen PRN continued, hold gabapentin for now - consider restarting outpatient Seroquel depending on EKG - supportive care  Best Practice (right click and "Reselect all SmartList Selections" daily)  Diet/type: NPO w/ meds via tube DVT prophylaxis LMWH Pressure ulcer(s): N/A GI prophylaxis: H2B Lines: N/A Foley:  Yes, and it is still needed Code Status:  full code Last date of multidisciplinary goals of care discussion [11/20/23]  Labs   CBC: Recent Labs  Lab 11/19/23 1655 11/20/23 0450 11/21/23 0425  WBC 4.3 5.5 7.5  NEUTROABS 2.8  --   --  HGB 14.3 11.8* 11.1*  HCT 41.9 35.3* 31.6*  MCV 92.1 94.1 88.3  PLT 214 179 255    Basic Metabolic Panel: Recent Labs  Lab 11/19/23 1941 11/20/23 0450 11/20/23 1537 11/21/23 0425  NA 138 139  --  136  K 3.4* 3.0* 3.8 3.2*  CL 104 108  --  103  CO2 26 22  --  26  GLUCOSE 107* 141*  --  125*  BUN 13 8  --  <5*  CREATININE 0.65 0.51*  --  0.51*  CALCIUM 7.5* 7.7*  --  7.8*  MG  --  1.8 2.0 1.8  PHOS  --  1.8* 2.5 2.1*   GFR: Estimated Creatinine  Clearance: 134.9 mL/min (A) (by C-G formula based on SCr of 0.51 mg/dL (L)). Recent Labs  Lab 11/19/23 1655 11/19/23 1656 11/19/23 1935 11/20/23 0450 11/20/23 1000 11/21/23 0425  PROCALCITON  --   --   --  <0.10  <0.10  --  0.17  WBC 4.3  --   --  5.5  --  7.5  LATICACIDVEN  --  1.1 1.4 1.4 1.4  --     Liver Function Tests: Recent Labs  Lab 11/19/23 1941  AST 21  ALT 9  ALKPHOS 47  BILITOT 1.0  PROT 5.6*  ALBUMIN 2.6*   No results for input(s): "LIPASE", "AMYLASE" in the last 168 hours. No results for input(s): "AMMONIA" in the last 168 hours.  ABG    Component Value Date/Time   PHART 7.43 11/20/2023 0154   PCO2ART 34 11/20/2023 0154   PO2ART 84 11/20/2023 0154   HCO3 22.6 11/20/2023 0154   TCO2 28 05/31/2021 0404   ACIDBASEDEF 1.2 11/20/2023 0154   O2SAT 97.7 11/20/2023 0154     Coagulation Profile: Recent Labs  Lab 11/19/23 1940 11/20/23 0450  INR 1.2 1.2    Cardiac Enzymes: No results for input(s): "CKTOTAL", "CKMB", "CKMBINDEX", "TROPONINI" in the last 168 hours.  HbA1C: Hgb A1c MFr Bld  Date/Time Value Ref Range Status  04/11/2022 06:10 AM 5.1 4.8 - 5.6 % Final    Comment:    (NOTE) Pre diabetes:          5.7%-6.4%  Diabetes:              >6.4%  Glycemic control for   <7.0% adults with diabetes   05/31/2021 04:18 PM 4.9 4.8 - 5.6 % Final    Comment:    (NOTE) Pre diabetes:          5.7%-6.4%  Diabetes:              >6.4%  Glycemic control for   <7.0% adults with diabetes     CBG: Recent Labs  Lab 11/20/23 1559 11/20/23 1741 11/20/23 2012 11/20/23 2328 11/21/23 0359  GLUCAP 183* 133* 144* 138* 105*    Review of Systems:   UTA- patient non verbal at baseline, and in respiratory distress  Past Medical History:  He,  has a past medical history of 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   Surgical History:   Past Surgical  History:  Procedure Laterality Date   GASTROSTOMY TUBE PLACEMENT       Social History:   reports that he has never smoked. He has never used smokeless tobacco. He reports that he does not currently use alcohol. He reports that he does not use drugs.   Family History:  His family history is  not on file.   Allergies No Known Allergies   Home Medications  Prior to Admission medications   Medication Sig Start Date End Date Taking? Authorizing Provider  melatonin 5 MG TABS Take 10 mg by mouth at bedtime.   Yes [provider]  QUEtiapine (SEROQUEL XR) 200 MG 24 hr tablet Take 200 mg by mouth in the morning.   Yes [provider]  QUEtiapine (SEROQUEL) 25 MG tablet Take 25 mg by mouth at bedtime.   Yes [provider]  valproic acid (DEPAKENE) 250 MG capsule Take 750 mg by mouth every evening.   Yes [provider]  acetaminophen (TYLENOL) 160 MG/5ML solution Place 20.3 mLs (650 mg total) into feeding tube every 6 (six) hours as needed for mild pain, moderate pain, headache or fever. Patient not taking: Reported on 09/12/2021 06/06/21   Rolly Salter, MD  baclofen (LIORESAL) 10 MG tablet Take 10 mg by mouth 3 (three) times daily.   Yes [provider]  diazePAM, 15 MG Dose, (VALTOCO 15 MG DOSE) 2 x 7.5 MG/0.1ML LQPK Spray 7.5 mg into each nostril in the event of a seizure 04/20/22  Yes Zigmund Daniel., MD  gabapentin (NEURONTIN) 400 MG capsule Take 1 capsule (400 mg total) by mouth 3 (three) times daily. 04/20/22 11/20/23 Yes Zigmund Daniel., MD  lacosamide 100 MG TABS Take 1 tablet (100 mg total) by mouth 2 (two) times daily. 04/20/22 11/20/23 Yes Zigmund Daniel., MD  melatonin 3 MG TABS tablet Place 2 tablets (6 mg total) into feeding tube at bedtime. Patient not taking: Reported on 11/20/2023 06/04/21   Rolly Salter, MD  polyethylene glycol (MIRALAX / GLYCOLAX) 17 g packet Place 17 g into feeding tube daily. Patient taking  differently: Take 17 g by mouth daily. 06/04/21  Yes Rolly Salter, MD  QUEtiapine (SEROQUEL) 100 MG tablet Take 100 mg by mouth daily. Patient not taking: Reported on 11/20/2023    [provider]  senna (SENOKOT) 8.6 MG TABS tablet Take 8.6 mg by mouth at bedtime. Patient not taking: Reported on 11/20/2023    [provider]  triamcinolone ointment (KENALOG) 0.1 % Apply topically 2 (two) times daily. Patient not taking: Reported on 09/12/2021 06/03/21   Rolly Salter, MD  valproic acid (DEPAKENE) 250 MG capsule Take 4 capsules (1,000 mg total) by mouth 2 (two) times daily. Patient taking differently: Take 500 mg by mouth every morning. 04/20/22 11/20/23 Yes Zigmund Daniel., MD     Critical care provider statement:   Total critical care time: 33 minutes   Performed by: Karna Christmas MD   Critical care time was exclusive of separately billable procedures and treating other patients.   Critical care was necessary to treat or prevent imminent or life-threatening deterioration.   Critical care was time spent personally by me on the following activities: development of treatment plan with patient and/or surrogate as well as nursing, discussions with consultants, evaluation of patient's response to treatment, examination of patient, obtaining history from patient or surrogate, ordering and performing treatments and interventions, ordering and review of laboratory studies, ordering and review of radiographic studies, pulse oximetry and re-evaluation of patient's condition.    Vida Rigger, M.D.  Pulmonary & Critical Care Medicine

## 2023-11-21 NOTE — Plan of Care (Signed)
  Problem: Clinical Measurements: Goal: Signs and symptoms of infection will decrease Outcome: Progressing   Problem: Respiratory: Goal: Ability to maintain a clear airway and adequate ventilation will improve Outcome: Progressing

## 2023-11-21 NOTE — Progress Notes (Signed)
Bite block placed due to pt biting down on ETT.

## 2023-11-22 ENCOUNTER — Inpatient Hospital Stay: Payer: Medicaid Other

## 2023-11-22 DIAGNOSIS — J9601 Acute respiratory failure with hypoxia: Secondary | ICD-10-CM | POA: Diagnosis not present

## 2023-11-22 LAB — CBC
HCT: 35.3 % — ABNORMAL LOW (ref 39.0–52.0)
Hemoglobin: 12.1 g/dL — ABNORMAL LOW (ref 13.0–17.0)
MCH: 31.6 pg (ref 26.0–34.0)
MCHC: 34.3 g/dL (ref 30.0–36.0)
MCV: 92.2 fL (ref 80.0–100.0)
Platelets: 267 10*3/uL (ref 150–400)
RBC: 3.83 MIL/uL — ABNORMAL LOW (ref 4.22–5.81)
RDW: 12.4 % (ref 11.5–15.5)
WBC: 8 10*3/uL (ref 4.0–10.5)
nRBC: 0 % (ref 0.0–0.2)

## 2023-11-22 LAB — BLOOD GAS, ARTERIAL
Acid-base deficit: 0.8 mmol/L (ref 0.0–2.0)
Bicarbonate: 23.5 mmol/L (ref 20.0–28.0)
FIO2: 1 %
MECHVT: 550 mL
O2 Saturation: 99.8 %
PEEP: 8 cmH2O
Patient temperature: 37
RATE: 16 {breaths}/min
pCO2 arterial: 37 mm[Hg] (ref 32–48)
pH, Arterial: 7.41 (ref 7.35–7.45)
pO2, Arterial: 177 mm[Hg] — ABNORMAL HIGH (ref 83–108)

## 2023-11-22 LAB — BASIC METABOLIC PANEL
Anion gap: 11 (ref 5–15)
BUN: <5 mg/dL — ABNORMAL LOW (ref 6–20)
CO2: 25 mmol/L (ref 22–32)
Calcium: 8.3 mg/dL — ABNORMAL LOW (ref 8.9–10.3)
Chloride: 104 mmol/L (ref 98–111)
Creatinine, Ser: 0.61 mg/dL (ref 0.61–1.24)
GFR, Estimated: >60 mL/min (ref >60)
Glucose, Bld: 98 mg/dL (ref 70–99)
Potassium: 3.8 mmol/L (ref 3.5–5.1)
Sodium: 140 mmol/L (ref 135–145)

## 2023-11-22 LAB — GLUCOSE, CAPILLARY
Glucose-Capillary: 82 mg/dL (ref 70–99)
Glucose-Capillary: 91 mg/dL (ref 70–99)
Glucose-Capillary: 93 mg/dL (ref 70–99)
Glucose-Capillary: 97 mg/dL (ref 70–99)

## 2023-11-22 LAB — PHOSPHORUS: Phosphorus: 3.4 mg/dL (ref 2.5–4.6)

## 2023-11-22 LAB — TRIGLYCERIDES: Triglycerides: 107 mg/dL (ref <150)

## 2023-11-22 LAB — MAGNESIUM: Magnesium: 1.8 mg/dL (ref 1.7–2.4)

## 2023-11-22 LAB — LACOSAMIDE: Lacosamide: 8.6 ug/mL (ref 5.0–10.0)

## 2023-11-22 MED ORDER — LORAZEPAM 2 MG/ML IJ SOLN
1.0000 mg | Freq: Once | INTRAMUSCULAR | Status: AC
  Administered 2023-11-22: 1 mg via INTRAVENOUS
  Filled 2023-11-22: qty 1

## 2023-11-22 MED ORDER — ETOMIDATE 2 MG/ML IV SOLN: 20.0000 mg | Freq: Once | INTRAVENOUS | Status: AC

## 2023-11-22 MED ORDER — ETOMIDATE 2 MG/ML IV SOLN
INTRAVENOUS | Status: AC
  Administered 2023-11-22: 20 mg via INTRAVENOUS
  Filled 2023-11-22: qty 10

## 2023-11-22 MED ORDER — ORAL CARE MOUTH RINSE
15.0000 mL | OROMUCOSAL | Status: DC
  Administered 2023-11-22 – 2023-11-29 (×83): 15 mL via OROMUCOSAL

## 2023-11-22 MED ORDER — ORAL CARE MOUTH RINSE: 15.0000 mL | OROMUCOSAL | Status: DC | PRN

## 2023-11-22 MED ORDER — ROCURONIUM BROMIDE 10 MG/ML (PF) SYRINGE: 50.0000 mg | PREFILLED_SYRINGE | Freq: Once | INTRAVENOUS | Status: AC

## 2023-11-22 MED ORDER — ROCURONIUM BROMIDE 10 MG/ML (PF) SYRINGE
PREFILLED_SYRINGE | INTRAVENOUS | Status: AC
  Administered 2023-11-22: 50 mg via INTRAVENOUS
  Filled 2023-11-22: qty 10

## 2023-11-22 MED ORDER — FENTANYL CITRATE PF 50 MCG/ML IJ SOSY
PREFILLED_SYRINGE | INTRAMUSCULAR | Status: AC
  Administered 2023-11-22: 100 ug
  Filled 2023-11-22: qty 2

## 2023-11-22 MED ORDER — FENTANYL CITRATE (PF) 100 MCG/2ML IJ SOLN
100.0000 ug | Freq: Once | INTRAMUSCULAR | Status: AC
  Administered 2023-11-22: 100 ug via INTRAVENOUS

## 2023-11-22 MED ORDER — DEXAMETHASONE SODIUM PHOSPHATE 10 MG/ML IJ SOLN
8.0000 mg | Freq: Once | INTRAMUSCULAR | Status: AC
  Administered 2023-11-22: 8 mg via INTRAVENOUS
  Filled 2023-11-22: qty 0.8

## 2023-11-22 NOTE — Progress Notes (Signed)
Pt placed on bipap due to desats and shortness of breath and increased HR. Placed on bipap for 10 min and failed due to desats and placed on vent.

## 2023-11-22 NOTE — Progress Notes (Signed)
NAME:  Alex [name], MRN:  [id number], DOB:  June 17, 1996, LOS: 3 ADMISSION DATE:  11/19/2023, CONSULTATION DATE:  11/20/23 REFERRING MD:  Dr. Arville Care, CHIEF COMPLAINT:  AMS   History of Present Illness:  28 yo M presenting to [facility] ED from outpatient rehab on 11/19/23 for evaluation of altered mental status.  History obtained per chart review and mother's telephone interview, patient unable to participate in interview due to respiratory distress and baseline TBI. 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 He was treated at [facility] for 5 months then discharged to rehab. Per his mother and legal guardian his baseline is: Non verbal but he will yell out sporadic nonsensical words with left sided paralysis. He is able to move his RUE in order to feed himself with finger foods and he has some involuntary movement with that arm, he will swat at you. Similar baseline function with RLE, he can kick and move, but often will lay it bent and to the side. He has enough strength to even try and get out of bed on the right side. She denies any issues with swallowing, but eats mostly soft food. If he is not watched closely with eating he will try to eat all the food at once. The facility staff reported he was at his normal baseline until lunchtime on 11/19/23. It was observed he was more somnolent and not interactive. Staff noted a cough and slightly increased work of breathing. Mom also confirmed noting a congested cough the last time she visited earlier in the week. Staff and mom denied nausea/ vomiting, but mom reports chronic loose stools.  EMS reported the patient febrile and tachycardic on arrival.  ED course: Upon arrival patient tachycardic and lethargic. Sepsis protocol initiated with antibiotics and IVF resuscitation. Labs significant for mild hypokalemia, otherwise WNL. Initial imaging unremarkable but then patient became hypoxic with SpO2 85% on RA and a CTa was obtained, negative  for PE but concerning for aspiration pneumonia. TRH consulted for admission. While patient was pending admission in ED he became increasingly hypoxic in the 60's, placed on a NRB without improvement. PCCM alerted the need to emergently intubate the patient. Medications given: Cefepime, Flagyl, Vancomycin, 2 L IVF bolus, IV contrast Initial Vitals: 97.9, 17, 130, 119/82 & 92 on RA Significant labs: (Labs/ Imaging personally reviewed) EKG : pending Chemistry: Na+: 138, K+: 3.4, BUN/Cr.: 13/ 0.65, Serum CO2/ AG: 26/8 Hematology: WBC: 4.3, Hgb: 14.3,  Lactic/ PCT: 1.1 > 1.4 / pending, COVID-19 & Influenza A/B: negative  ABG: pending post intubation  CXR 11/19/23: no active disease CT head wo contrast 11/19/23: No evidence of acute intracranial abnormality. Stable multifocal encephalomalacia, likely the sequela of prior trauma. Similar age-advanced cerebral atrophy CT angio chest PE 11/19/23:  No pulmonary embolus. Near complete filling of the right mainstem bronchus with filling extending into the bronchus intermedius, right upper, middle and lower lobe bronchi. Layering debris in the trachea. Findings are highly suspicious for aspiration. Minimal ill-defined patchy and nodular airspace disease in the dependent right lower lobe, likely postobstructive pneumonia.  PCCM consulted for assistance in management and monitoring due to acute hypoxic respiratory failure requiring urgent intubation and mechanical ventilatory support secondary to suspected aspiration and pneumonia.  Pertinent  Medical History  Seizures TBI (09/2019)  Significant Hospital Events: Including procedures, antibiotic start and stop dates in addition to other pertinent events   11/20/23: Admit to ICU due to acute hypoxic respiratory failure requiring urgent intubation  and mechanical ventilatory support secondary to suspected aspiration and pneumonia 11/21/23- patient moving RUE, mother at bedside we reviewed medical plan. He remains on  63mcg/kg/hr levophed, today plan to rescusitate more aggresively and wean from levophed and potentially extubate post SBP.   He is febrile this am.  He is on zithromax, unasyn 11/22/23- s/p bronch yesterday with aspiration of mucus plugging.  Today resp status improved with liberation protocol in proces. SLP post extubation today.   Objective   Blood pressure 115/85, pulse (!) 121, temperature 99.5 F (37.5 C), resp. rate 13, height 5\' 9"  (1.753 m), weight 70.2 kg, SpO2 96%.    Vent Mode: PRVC FiO2 (%):  [28 %] 28 % Set Rate:  [6 bmp-16 bmp] 6 bmp Vt Set:  [550 mL] 550 mL PEEP:  [5 cmH20] 5 cmH20 Plateau Pressure:  [17 cmH20] 17 cmH20   Intake/Output Summary (Last 24 hours) at 11/22/2023 1047 Last data filed at 11/22/2023 0800 Gross per 24 hour  Intake 2557.27 ml  Output 4005 ml  Net -1447.73 ml   Filed Weights   11/19/23 1542 11/21/23 0422 11/22/23 0425  Weight: 69.5 kg 69.4 kg 70.2 kg    Examination: General: Adult male on non rebreather O2 NAD HEENT: MM pink/moist, anicteric, atraumatic, neck supple Neuro: RASS: -4, unable to follow commands, PERRL +3, moving RUE/RLE (baseline) CV: s1s2 RRR, ST on monitor, no r/m/g Pulm: Regular, labored on NRB, breath sounds coarse-BUL & diminished-BLL GI: soft, rounded, non tender, bs x 4 Skin: no rashes/lesions noted Extremities: warm/dry, pulses + 2 R/P, no edema noted    Assessment & Plan:    Acute Hypoxic Respiratory Failure secondary to suspected aspiration and possible post obstructive pneumonia in the setting of TBI - s/p liberation from MV -continue abx - Strict I/O's: alert provider if UOP < 0.5 mL/kg/hr - continuous cardiac monitoring - aspiration precautions  Mild Hypokalemia-repleted - K+ supplementation ordered - Daily BMP, replace electrolytes PRN   Chronic Seizure Disorder Recent valproic acid level was elevated. - f/u valproic acid & lacosamide levels - continue outpatient regimen: valproic acid & lacosamide  for now - monitor neurological status - seizure precautions  Chronic TBI - baclofen PRN continued, hold gabapentin for now - consider restarting outpatient Seroquel depending on EKG - supportive care  Best Practice (right click and "Reselect all SmartList Selections" daily)  Diet/type: NPO w/ meds via tube DVT prophylaxis LMWH Pressure ulcer(s): N/A GI prophylaxis: H2B Lines: N/A Foley:  Yes, and it is still needed Code Status:  full code Last date of multidisciplinary goals of care discussion [11/20/23]  Labs   CBC: Recent Labs  Lab 11/19/23 1655 11/20/23 0450 11/21/23 0425 11/22/23 0402  WBC 4.3 5.5 7.5 8.0  NEUTROABS 2.8  --   --   --   HGB 14.3 11.8* 11.1* 12.1*  HCT 41.9 35.3* 31.6* 35.3*  MCV 92.1 94.1 88.3 92.2  PLT 214 179 255 267    Basic Metabolic Panel: Recent Labs  Lab 11/19/23 1941 11/20/23 0450 11/20/23 1537 11/21/23 0425 11/22/23 0402  NA 138 139  --  136 140  K 3.4* 3.0* 3.8 3.2* 3.8  CL 104 108  --  103 104  CO2 26 22  --  26 25  GLUCOSE 107* 141*  --  125* 98  BUN 13 8  --  <5* <5*  CREATININE 0.65 0.51*  --  0.51* 0.61  CALCIUM 7.5* 7.7*  --  7.8* 8.3*  MG  --  1.8 2.0 1.8  1.8  PHOS  --  1.8* 2.5 2.1* 3.4   GFR: Estimated Creatinine Clearance: 136.5 mL/min (by C-G formula based on SCr of 0.61 mg/dL). Recent Labs  Lab 11/19/23 1655 11/19/23 1656 11/19/23 1935 11/20/23 0450 11/20/23 1000 11/21/23 0425 11/22/23 0402  PROCALCITON  --   --   --  <0.10  <0.10  --  0.17  --   WBC 4.3  --   --  5.5  --  7.5 8.0  LATICACIDVEN  --  1.1 1.4 1.4 1.4  --   --     Liver Function Tests: Recent Labs  Lab 11/19/23 1941  AST 21  ALT 9  ALKPHOS 47  BILITOT 1.0  PROT 5.6*  ALBUMIN 2.6*   No results for input(s): "LIPASE", "AMYLASE" in the last 168 hours. No results for input(s): "AMMONIA" in the last 168 hours.  ABG    Component Value Date/Time   PHART 7.43 11/20/2023 0154   PCO2ART 34 11/20/2023 0154   PO2ART 84 11/20/2023 0154    HCO3 22.6 11/20/2023 0154   TCO2 28 05/31/2021 0404   ACIDBASEDEF 1.2 11/20/2023 0154   O2SAT 97.7 11/20/2023 0154     Coagulation Profile: Recent Labs  Lab 11/19/23 1940 11/20/23 0450  INR 1.2 1.2    Cardiac Enzymes: No results for input(s): "CKTOTAL", "CKMB", "CKMBINDEX", "TROPONINI" in the last 168 hours.  HbA1C: Hgb A1c MFr Bld  Date/Time Value Ref Range Status  04/11/2022 06:10 AM 5.1 4.8 - 5.6 % Final    Comment:    (NOTE) Pre diabetes:          5.7%-6.4%  Diabetes:              >6.4%  Glycemic control for   <7.0% adults with diabetes   05/31/2021 04:18 PM 4.9 4.8 - 5.6 % Final    Comment:    (NOTE) Pre diabetes:          5.7%-6.4%  Diabetes:              >6.4%  Glycemic control for   <7.0% adults with diabetes     CBG: Recent Labs  Lab 11/21/23 1556 11/21/23 2015 11/21/23 2342 11/22/23 0359 11/22/23 0753  GLUCAP 101* 88 109* 91 82    Review of Systems:   UTA- patient non verbal at baseline, and in respiratory distress  Past Medical History:  He,  has a past medical history of 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   Surgical History:   Past Surgical History:  Procedure Laterality Date   GASTROSTOMY TUBE PLACEMENT       Social History:   reports that he has never smoked. He has never used smokeless tobacco. He reports that he does not currently use alcohol. He reports that he does not use drugs.   Family History:  His family history is not on file.   Allergies No Known Allergies   Home Medications  Prior to Admission medications   Medication Sig Start Date End Date Taking? Authorizing Provider  melatonin 5 MG TABS Take 10 mg by mouth at bedtime.   Yes [provider]  QUEtiapine (SEROQUEL XR) 200 MG 24 hr tablet Take 200 mg by mouth in the morning.   Yes [provider]  QUEtiapine (SEROQUEL) 25 MG tablet Take 25 mg  by mouth at bedtime.   Yes [provider]  valproic acid (DEPAKENE) 250 MG capsule Take 750  mg by mouth every evening.   Yes [provider]  acetaminophen (TYLENOL) 160 MG/5ML solution Place 20.3 mLs (650 mg total) into feeding tube every 6 (six) hours as needed for mild pain, moderate pain, headache or fever. Patient not taking: Reported on 09/12/2021 06/06/21   Rolly Salter, MD  baclofen (LIORESAL) 10 MG tablet Take 10 mg by mouth 3 (three) times daily.   Yes [provider]  diazePAM, 15 MG Dose, (VALTOCO 15 MG DOSE) 2 x 7.5 MG/0.1ML LQPK Spray 7.5 mg into each nostril in the event of a seizure 04/20/22  Yes Zigmund Daniel., MD  gabapentin (NEURONTIN) 400 MG capsule Take 1 capsule (400 mg total) by mouth 3 (three) times daily. 04/20/22 11/20/23 Yes Zigmund Daniel., MD  lacosamide 100 MG TABS Take 1 tablet (100 mg total) by mouth 2 (two) times daily. 04/20/22 11/20/23 Yes Zigmund Daniel., MD  melatonin 3 MG TABS tablet Place 2 tablets (6 mg total) into feeding tube at bedtime. Patient not taking: Reported on 11/20/2023 06/04/21   Rolly Salter, MD  polyethylene glycol (MIRALAX / GLYCOLAX) 17 g packet Place 17 g into feeding tube daily. Patient taking differently: Take 17 g by mouth daily. 06/04/21  Yes Rolly Salter, MD  QUEtiapine (SEROQUEL) 100 MG tablet Take 100 mg by mouth daily. Patient not taking: Reported on 11/20/2023    [provider]  senna (SENOKOT) 8.6 MG TABS tablet Take 8.6 mg by mouth at bedtime. Patient not taking: Reported on 11/20/2023    [provider]  triamcinolone ointment (KENALOG) 0.1 % Apply topically 2 (two) times daily. Patient not taking: Reported on 09/12/2021 06/03/21   Rolly Salter, MD  valproic acid (DEPAKENE) 250 MG capsule Take 4 capsules (1,000 mg total) by mouth 2 (two) times daily. Patient taking differently: Take 500 mg by mouth every morning. 04/20/22 11/20/23 Yes Zigmund Daniel., MD      Critical care provider statement:   Total critical care time: 33 minutes   Performed by: Karna Christmas MD   Critical care time was exclusive of separately billable procedures and treating other patients.   Critical care was necessary to treat or prevent imminent or life-threatening deterioration.   Critical care was time spent personally by me on the following activities: development of treatment plan with patient and/or surrogate as well as nursing, discussions with consultants, evaluation of patient's response to treatment, examination of patient, obtaining history from patient or surrogate, ordering and performing treatments and interventions, ordering and review of laboratory studies, ordering and review of radiographic studies, pulse oximetry and re-evaluation of patient's condition.    Vida Rigger, M.D.  Pulmonary & Critical Care Medicine

## 2023-11-22 NOTE — Plan of Care (Signed)
  Problem: Fluid Volume: Goal: Hemodynamic stability will improve Outcome: Progressing   Problem: Respiratory: Goal: Ability to maintain adequate ventilation will improve Outcome: Progressing   Problem: Respiratory: Goal: Ability to maintain a clear airway and adequate ventilation will improve Outcome: Progressing   Problem: Clinical Measurements: Goal: Respiratory complications will improve Outcome: Progressing Goal: Cardiovascular complication will be avoided Outcome: Progressing   Problem: Nutrition: Goal: Adequate nutrition will be maintained Outcome: Not Progressing

## 2023-11-22 NOTE — Procedures (Signed)
Intubation Procedure Note  Alex [name]  [id number]  12-Jun-1996  Date:11/22/23  Time:4:55 PM   Provider Performing:Persephone Schriever D Elvina Sidle    Procedure: Intubation (31500)  Indication(s) Respiratory Failure  Consent Risks of the procedure as well as the alternatives and risks of each were explained to the patient and/or caregiver.  Consent for the procedure was obtained and is signed in the bedside chart   Anesthesia Etomidate, Fentanyl, and Rocuronium   Time Out Verified patient identification, verified procedure, site/side was marked, verified correct patient position, special equipment/implants available, medications/allergies/relevant history reviewed, required imaging and test results available.   Sterile Technique Usual hand hygeine, masks, and gloves were used   Procedure Description Patient positioned in bed supine.  Sedation given as noted above.  Patient was intubated with endotracheal tube using Glidescope.  View was Grade 1 full glottis .  Number of attempts was 1.  Colorimetric CO2 detector was consistent with tracheal placement.   Complications/Tolerance None; patient tolerated the procedure well. Chest X-ray is ordered to verify placement.   EBL Minimal   Specimen(s) None   Size 7.5 ETT Tube secured at 25 cm at lip    Harlon Ditty, AGACNP-BC  Pulmonary & Critical Care Prefer epic messenger for cross cover needs If after hours, please call E-link

## 2023-11-22 NOTE — Progress Notes (Signed)
Pt extubated, no stridor noted, roomair sats 98%, respiratory rate 20/min.

## 2023-11-22 NOTE — Plan of Care (Signed)
  Problem: Fluid Volume: Goal: Hemodynamic stability will improve Outcome: Progressing   Problem: Clinical Measurements: Goal: Signs and symptoms of infection will decrease Outcome: Progressing   Problem: Respiratory: Goal: Ability to maintain adequate ventilation will improve Outcome: Progressing   Problem: Respiratory: Goal: Ability to maintain a clear airway and adequate ventilation will improve Outcome: Progressing   Problem: Clinical Measurements: Goal: Diagnostic test results will improve Outcome: Progressing Goal: Respiratory complications will improve Outcome: Progressing

## 2023-11-23 ENCOUNTER — Inpatient Hospital Stay: Payer: Medicaid Other

## 2023-11-23 LAB — CBC
HCT: 39.4 % (ref 39.0–52.0)
Hemoglobin: 13.3 g/dL (ref 13.0–17.0)
MCH: 31.1 pg (ref 26.0–34.0)
MCHC: 33.8 g/dL (ref 30.0–36.0)
MCV: 92.1 fL (ref 80.0–100.0)
Platelets: 301 10*3/uL (ref 150–400)
RBC: 4.28 MIL/uL (ref 4.22–5.81)
RDW: 12.5 % (ref 11.5–15.5)
WBC: 8.1 10*3/uL (ref 4.0–10.5)
nRBC: 0 % (ref 0.0–0.2)

## 2023-11-23 LAB — CULTURE, RESPIRATORY W GRAM STAIN

## 2023-11-23 LAB — BASIC METABOLIC PANEL
Anion gap: 13 (ref 5–15)
BUN: 6 mg/dL (ref 6–20)
CO2: 22 mmol/L (ref 22–32)
Calcium: 8.5 mg/dL — ABNORMAL LOW (ref 8.9–10.3)
Chloride: 102 mmol/L (ref 98–111)
Creatinine, Ser: 0.54 mg/dL — ABNORMAL LOW (ref 0.61–1.24)
GFR, Estimated: >60 mL/min (ref >60)
Glucose, Bld: 136 mg/dL — ABNORMAL HIGH (ref 70–99)
Potassium: 4 mmol/L (ref 3.5–5.1)
Sodium: 137 mmol/L (ref 135–145)

## 2023-11-23 LAB — GLUCOSE, CAPILLARY
Glucose-Capillary: 88 mg/dL (ref 70–99)
Glucose-Capillary: 133 mg/dL — ABNORMAL HIGH (ref 70–99)
Glucose-Capillary: 127 mg/dL — ABNORMAL HIGH (ref 70–99)
Glucose-Capillary: 127 mg/dL — ABNORMAL HIGH (ref 70–99)
Glucose-Capillary: 102 mg/dL — ABNORMAL HIGH (ref 70–99)
Glucose-Capillary: 102 mg/dL — ABNORMAL HIGH (ref 70–99)

## 2023-11-23 LAB — MAGNESIUM: Magnesium: 2.1 mg/dL (ref 1.7–2.4)

## 2023-11-23 LAB — PHOSPHORUS: Phosphorus: 3 mg/dL (ref 2.5–4.6)

## 2023-11-23 MED ORDER — VALPROIC ACID 250 MG/5ML PO SOLN
500.0000 mg | Freq: Every day | ORAL | Status: DC
  Administered 2023-11-23 – 2024-05-23 (×183): 500 mg
  Filled 2023-11-23 (×186): qty 10

## 2023-11-23 MED ORDER — LEVOFLOXACIN IN D5W 750 MG/150ML IV SOLN
750.0000 mg | INTRAVENOUS | Status: AC
  Administered 2023-11-23 – 2023-11-29 (×7): 750 mg via INTRAVENOUS
  Filled 2023-11-23 (×7): qty 150

## 2023-11-23 MED ORDER — LACOSAMIDE 50 MG PO TABS
100.0000 mg | ORAL_TABLET | Freq: Two times a day (BID) | ORAL | Status: DC
  Administered 2023-11-23 – 2024-05-23 (×364): 100 mg
  Filled 2023-11-23 (×363): qty 2

## 2023-11-23 MED ORDER — FREE WATER
30.0000 mL | Status: DC
  Administered 2023-11-23 – 2023-11-27 (×24): 30 mL

## 2023-11-23 MED ORDER — JUVEN PO PACK
1.0000 | PACK | Freq: Two times a day (BID) | ORAL | Status: DC
Start: 2023-11-24 — End: 2023-11-27
  Administered 2023-11-24 – 2023-11-27 (×8): 1

## 2023-11-23 MED ORDER — VALPROIC ACID 250 MG/5ML PO SOLN
750.0000 mg | Freq: Every day | ORAL | Status: DC
  Administered 2023-11-23 – 2024-05-22 (×180): 750 mg
  Filled 2023-11-23 (×186): qty 15

## 2023-11-23 MED ORDER — LACTATED RINGERS IV SOLN: INTRAVENOUS | Status: AC

## 2023-11-23 MED ORDER — BLISTEX MEDICATED EX OINT
TOPICAL_OINTMENT | CUTANEOUS | Status: DC | PRN
  Filled 2023-11-23 (×2): qty 6.3

## 2023-11-23 MED ORDER — LACTATED RINGERS IV BOLUS
1000.0000 mL | Freq: Once | INTRAVENOUS | Status: AC
  Administered 2023-11-23: 1000 mL via INTRAVENOUS

## 2023-11-23 MED ORDER — VITAL 1.5 CAL PO LIQD
1000.0000 mL | ORAL | Status: DC
  Administered 2023-11-23 – 2023-11-26 (×2): 1000 mL

## 2023-11-23 MED ORDER — PROSOURCE TF20 ENFIT COMPATIBL EN LIQD
60.0000 mL | Freq: Every day | ENTERAL | Status: DC
  Administered 2023-11-24 – 2023-11-27 (×4): 60 mL
  Filled 2023-11-23: qty 60
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
## 2023-11-23 NOTE — Progress Notes (Signed)
NAME:  Alex [name], MRN:  [id number], DOB:  02-12-1996, LOS: 4 ADMISSION DATE:  11/19/2023, CONSULTATION DATE:  11/20/23 REFERRING MD:  Dr. Arville Care, CHIEF COMPLAINT:  AMS   History of Present Illness:  28 yo M presenting to [facility] ED from outpatient rehab on 11/19/23 for evaluation of altered mental status.  History obtained per chart review and mother's telephone interview, patient unable to participate in interview due to respiratory distress and baseline TBI. 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 He was treated at [facility] for 5 months then discharged to rehab. Per his mother and legal guardian his baseline is: Non verbal but he will yell out sporadic nonsensical words with left sided paralysis. He is able to move his RUE in order to feed himself with finger foods and he has some involuntary movement with that arm, he will swat at you. Similar baseline function with RLE, he can kick and move, but often will lay it bent and to the side. He has enough strength to even try and get out of bed on the right side. She denies any issues with swallowing, but eats mostly soft food. If he is not watched closely with eating he will try to eat all the food at once. The facility staff reported he was at his normal baseline until lunchtime on 11/19/23. It was observed he was more somnolent and not interactive. Staff noted a cough and slightly increased work of breathing. Mom also confirmed noting a congested cough the last time she visited earlier in the week. Staff and mom denied nausea/ vomiting, but mom reports chronic loose stools.  EMS reported the patient febrile and tachycardic on arrival.  ED course: Upon arrival patient tachycardic and lethargic. Sepsis protocol initiated with antibiotics and IVF resuscitation. Labs significant for mild hypokalemia, otherwise WNL. Initial imaging unremarkable but then patient became hypoxic with SpO2 85% on RA and a CTa was obtained, negative  for PE but concerning for aspiration pneumonia. TRH consulted for admission. While patient was pending admission in ED he became increasingly hypoxic in the 60's, placed on a NRB without improvement. PCCM alerted the need to emergently intubate the patient. Medications given: Cefepime, Flagyl, Vancomycin, 2 L IVF bolus, IV contrast Initial Vitals: 97.9, 17, 130, 119/82 & 92 on RA Significant labs: (Labs/ Imaging personally reviewed) EKG : pending Chemistry: Na+: 138, K+: 3.4, BUN/Cr.: 13/ 0.65, Serum CO2/ AG: 26/8 Hematology: WBC: 4.3, Hgb: 14.3,  Lactic/ PCT: 1.1 > 1.4 / pending, COVID-19 & Influenza A/B: negative  ABG: pending post intubation  CXR 11/19/23: no active disease CT head wo contrast 11/19/23: No evidence of acute intracranial abnormality. Stable multifocal encephalomalacia, likely the sequela of prior trauma. Similar age-advanced cerebral atrophy CT angio chest PE 11/19/23:  No pulmonary embolus. Near complete filling of the right mainstem bronchus with filling extending into the bronchus intermedius, right upper, middle and lower lobe bronchi. Layering debris in the trachea. Findings are highly suspicious for aspiration. Minimal ill-defined patchy and nodular airspace disease in the dependent right lower lobe, likely postobstructive pneumonia.  PCCM consulted for assistance in management and monitoring due to acute hypoxic respiratory failure requiring urgent intubation and mechanical ventilatory support secondary to suspected aspiration and pneumonia.  Pertinent  Medical History  Seizures TBI (09/2019)  Significant Hospital Events: Including procedures, antibiotic start and stop dates in addition to other pertinent events   11/20/23: Admit to ICU due to acute hypoxic respiratory failure requiring urgent intubation  and mechanical ventilatory support secondary to suspected aspiration and pneumonia 11/21/23- patient moving RUE, mother at bedside we reviewed medical plan. He remains on  59mcg/kg/hr levophed, today plan to rescusitate more aggresively and wean from levophed and potentially extubate post SBP.   He is febrile this am.  He is on zithromax, unasyn 11/22/23- s/p bronch yesterday with aspiration of mucus plugging.  Today resp status improved with liberation protocol in proces. SLP post extubation today. 11/23/23- patient is +for pseudomonas resp cultures.  He is also MRSA pcr +, refined therapy during rounds with pharmacist. He remains on MV   Objective   Blood pressure 104/74, pulse (!) 125, temperature 98.4 F (36.9 C), resp. rate 16, height 5\' 9"  (1.753 m), weight 70 kg, SpO2 97%.    Vent Mode: PRVC FiO2 (%):  [28 %-100 %] 50 % Set Rate:  [16 bmp] 16 bmp Vt Set:  [550 mL] 550 mL PEEP:  [5 cmH20-8 cmH20] 8 cmH20 Pressure Support:  [8 cmH20] 8 cmH20 Plateau Pressure:  [15 cmH20] 15 cmH20   Intake/Output Summary (Last 24 hours) at 11/23/2023 1129 Last data filed at 11/23/2023 1000 Gross per 24 hour  Intake 1605.47 ml  Output 1855 ml  Net -249.53 ml   Filed Weights   11/21/23 0422 11/22/23 0425 11/23/23 0415  Weight: 69.4 kg 70.2 kg 70 kg    Examination: General: Adult male on non rebreather O2 NAD HEENT: MM pink/moist, anicteric, atraumatic, neck supple Neuro: RASS: -4, unable to follow commands, PERRL +3, moving RUE/RLE (baseline) CV: s1s2 RRR, ST on monitor, no r/m/g Pulm: Regular, labored on NRB, breath sounds coarse-BUL & diminished-BLL GI: soft, rounded, non tender, bs x 4 Skin: no rashes/lesions noted Extremities: warm/dry, pulses + 2 R/P, no edema noted    Assessment & Plan:    Acute Hypoxic Respiratory Failure secondary to suspected aspiration and possible post obstructive pneumonia in the setting of TBI - s/p liberation from MV -continue abx - Strict I/O's: alert provider if UOP < 0.5 mL/kg/hr - continuous cardiac monitoring - aspiration precautions - Pseudomonas +tracheal aspirate   Mild Hypokalemia-repleted - K+ supplementation  ordered - Daily BMP, replace electrolytes PRN   Chronic Seizure Disorder Recent valproic acid level was elevated. - f/u valproic acid & lacosamide levels - continue outpatient regimen: valproic acid & lacosamide for now - monitor neurological status - seizure precautions  Chronic TBI - baclofen PRN continued, hold gabapentin for now - consider restarting outpatient Seroquel depending on EKG - supportive care  Best Practice (right click and "Reselect all SmartList Selections" daily)  Diet/type: NPO w/ meds via tube DVT prophylaxis LMWH Pressure ulcer(s): N/A GI prophylaxis: H2B Lines: N/A Foley:  Yes, and it is still needed Code Status:  full code Last date of multidisciplinary goals of care discussion [11/20/23]  Labs   CBC: Recent Labs  Lab 11/19/23 1655 11/20/23 0450 11/21/23 0425 11/22/23 0402 11/23/23 0356  WBC 4.3 5.5 7.5 8.0 8.1  NEUTROABS 2.8  --   --   --   --   HGB 14.3 11.8* 11.1* 12.1* 13.3  HCT 41.9 35.3* 31.6* 35.3* 39.4  MCV 92.1 94.1 88.3 92.2 92.1  PLT 214 179 255 267 301    Basic Metabolic Panel: Recent Labs  Lab 11/19/23 1941 11/20/23 0450 11/20/23 1537 11/21/23 0425 11/22/23 0402 11/23/23 0356  NA 138 139  --  136 140 137  K 3.4* 3.0* 3.8 3.2* 3.8 4.0  CL 104 108  --  103 104 102  CO2 26 22  --  26 25 22   GLUCOSE 107* 141*  --  125* 98 136*  BUN 13 8  --  <5* <5* 6  CREATININE 0.65 0.51*  --  0.51* 0.61 0.54*  CALCIUM 7.5* 7.7*  --  7.8* 8.3* 8.5*  MG  --  1.8 2.0 1.8 1.8 2.1  PHOS  --  1.8* 2.5 2.1* 3.4 3.0   GFR: Estimated Creatinine Clearance: 136.1 mL/min (A) (by C-G formula based on SCr of 0.54 mg/dL (L)). Recent Labs  Lab 11/19/23 1656 11/19/23 1935 11/20/23 0450 11/20/23 1000 11/21/23 0425 11/22/23 0402 11/23/23 0356  PROCALCITON  --   --  <0.10  <0.10  --  0.17  --   --   WBC  --   --  5.5  --  7.5 8.0 8.1  LATICACIDVEN 1.1 1.4 1.4 1.4  --   --   --     Liver Function Tests: Recent Labs  Lab 11/19/23 1941   AST 21  ALT 9  ALKPHOS 47  BILITOT 1.0  PROT 5.6*  ALBUMIN 2.6*   No results for input(s): "LIPASE", "AMYLASE" in the last 168 hours. No results for input(s): "AMMONIA" in the last 168 hours.  ABG    Component Value Date/Time   PHART 7.41 11/22/2023 1753   PCO2ART 37 11/22/2023 1753   PO2ART 177 (H) 11/22/2023 1753   HCO3 23.5 11/22/2023 1753   TCO2 28 05/31/2021 0404   ACIDBASEDEF 0.8 11/22/2023 1753   O2SAT 99.8 11/22/2023 1753     Coagulation Profile: Recent Labs  Lab 11/19/23 1940 11/20/23 0450  INR 1.2 1.2    Cardiac Enzymes: No results for input(s): "CKTOTAL", "CKMB", "CKMBINDEX", "TROPONINI" in the last 168 hours.  HbA1C: Hgb A1c MFr Bld  Date/Time Value Ref Range Status  04/11/2022 06:10 AM 5.1 4.8 - 5.6 % Final    Comment:    (NOTE) Pre diabetes:          5.7%-6.4%  Diabetes:              >6.4%  Glycemic control for   <7.0% adults with diabetes   05/31/2021 04:18 PM 4.9 4.8 - 5.6 % Final    Comment:    (NOTE) Pre diabetes:          5.7%-6.4%  Diabetes:              >6.4%  Glycemic control for   <7.0% adults with diabetes     CBG: Recent Labs  Lab 11/22/23 1515 11/22/23 1944 11/22/23 2348 11/23/23 0359 11/23/23 0727  GLUCAP 88 133* 97 127* 127*    Review of Systems:   UTA- patient non verbal at baseline, and in respiratory distress  Past Medical History:  He,  has a past medical history of 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   Surgical History:   Past Surgical History:  Procedure Laterality Date   GASTROSTOMY TUBE PLACEMENT       Social History:   reports that he has never smoked. He has never used smokeless tobacco. He reports that he does not currently use alcohol. He reports that he does not use drugs.   Family History:  His family history is not on file.   Allergies No Known Allergies   Home Medications  Prior to  Admission medications   Medication Sig Start Date End Date Taking? Authorizing Provider  melatonin 5 MG TABS Take 10  mg by mouth at bedtime.   Yes [provider]  QUEtiapine (SEROQUEL XR) 200 MG 24 hr tablet Take 200 mg by mouth in the morning.   Yes [provider]  QUEtiapine (SEROQUEL) 25 MG tablet Take 25 mg by mouth at bedtime.   Yes [provider]  valproic acid (DEPAKENE) 250 MG capsule Take 750 mg by mouth every evening.   Yes [provider]  acetaminophen (TYLENOL) 160 MG/5ML solution Place 20.3 mLs (650 mg total) into feeding tube every 6 (six) hours as needed for mild pain, moderate pain, headache or fever. Patient not taking: Reported on 09/12/2021 06/06/21   Rolly Salter, MD  baclofen (LIORESAL) 10 MG tablet Take 10 mg by mouth 3 (three) times daily.   Yes [provider]  diazePAM, 15 MG Dose, (VALTOCO 15 MG DOSE) 2 x 7.5 MG/0.1ML LQPK Spray 7.5 mg into each nostril in the event of a seizure 04/20/22  Yes Zigmund Daniel., MD  gabapentin (NEURONTIN) 400 MG capsule Take 1 capsule (400 mg total) by mouth 3 (three) times daily. 04/20/22 11/20/23 Yes Zigmund Daniel., MD  lacosamide 100 MG TABS Take 1 tablet (100 mg total) by mouth 2 (two) times daily. 04/20/22 11/20/23 Yes Zigmund Daniel., MD  melatonin 3 MG TABS tablet Place 2 tablets (6 mg total) into feeding tube at bedtime. Patient not taking: Reported on 11/20/2023 06/04/21   Rolly Salter, MD  polyethylene glycol (MIRALAX / GLYCOLAX) 17 g packet Place 17 g into feeding tube daily. Patient taking differently: Take 17 g by mouth daily. 06/04/21  Yes Rolly Salter, MD  QUEtiapine (SEROQUEL) 100 MG tablet Take 100 mg by mouth daily. Patient not taking: Reported on 11/20/2023    [provider]  senna (SENOKOT) 8.6 MG TABS tablet Take 8.6 mg by mouth at bedtime. Patient not taking: Reported on 11/20/2023    [provider]  triamcinolone ointment  (KENALOG) 0.1 % Apply topically 2 (two) times daily. Patient not taking: Reported on 09/12/2021 06/03/21   Rolly Salter, MD  valproic acid (DEPAKENE) 250 MG capsule Take 4 capsules (1,000 mg total) by mouth 2 (two) times daily. Patient taking differently: Take 500 mg by mouth every morning. 04/20/22 11/20/23 Yes Zigmund Daniel., MD     Critical care provider statement:   Total critical care time: 33 minutes   Performed by: Karna Christmas MD   Critical care time was exclusive of separately billable procedures and treating other patients.   Critical care was necessary to treat or prevent imminent or life-threatening deterioration.   Critical care was time spent personally by me on the following activities: development of treatment plan with patient and/or surrogate as well as nursing, discussions with consultants, evaluation of patient's response to treatment, examination of patient, obtaining history from patient or surrogate, ordering and performing treatments and interventions, ordering and review of laboratory studies, ordering and review of radiographic studies, pulse oximetry and re-evaluation of patient's condition.    Vida Rigger, M.D.  Pulmonary & Critical Care Medicine

## 2023-11-23 NOTE — Plan of Care (Signed)
  Problem: Fluid Volume: Goal: Hemodynamic stability will improve Outcome: Progressing   Problem: Respiratory: Goal: Ability to maintain adequate ventilation will improve Outcome: Progressing   Problem: Respiratory: Goal: Ability to maintain a clear airway and adequate ventilation will improve Outcome: Progressing   Problem: Clinical Measurements: Goal: Cardiovascular complication will be avoided Outcome: Progressing   Problem: Nutrition: Goal: Adequate nutrition will be maintained Outcome: Progressing   Problem: Education: Goal: Knowledge of General Education information will improve Description: Including pain rating scale, medication(s)/side effects and non-pharmacologic comfort measures Outcome: Not Progressing   Problem: Health Behavior/Discharge Planning: Goal: Ability to manage health-related needs will improve Outcome: Not Progressing   Problem: Elimination: Goal: Will not experience complications related to bowel motility Outcome: Not Progressing

## 2023-11-23 NOTE — Plan of Care (Signed)
  Problem: Fluid Volume: Goal: Hemodynamic stability will improve Outcome: Progressing   Problem: Clinical Measurements: Goal: Diagnostic test results will improve Outcome: Progressing Goal: Signs and symptoms of infection will decrease Outcome: Not Progressing   Problem: Respiratory: Goal: Ability to maintain adequate ventilation will improve Outcome: Progressing   Problem: Respiratory: Goal: Ability to maintain a clear airway and adequate ventilation will improve Outcome: Progressing   Problem: Clinical Measurements: Goal: Ability to maintain clinical measurements within normal limits will improve Outcome: Progressing Goal: Will remain free from infection Outcome: Progressing Goal: Respiratory complications will improve Outcome: Not Progressing   Problem: Respiratory: Goal: Ability to maintain a clear airway and adequate ventilation will improve Outcome: Progressing   Problem: Nutrition: Goal: Adequate nutrition will be maintained Outcome: Not Progressing
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
## 2023-11-24 LAB — CULTURE, BAL-QUANTITATIVE W GRAM STAIN: Culture: 20000 — AB

## 2023-11-24 LAB — CULTURE, BLOOD (ROUTINE X 2)
Culture: NO GROWTH
Culture: NO GROWTH

## 2023-11-24 LAB — CBC
HCT: 33.3 % — ABNORMAL LOW (ref 39.0–52.0)
Hemoglobin: 11.3 g/dL — ABNORMAL LOW (ref 13.0–17.0)
MCH: 30.9 pg (ref 26.0–34.0)
MCHC: 33.9 g/dL (ref 30.0–36.0)
MCV: 91 fL (ref 80.0–100.0)
Platelets: 260 10*3/uL (ref 150–400)
RBC: 3.66 MIL/uL — ABNORMAL LOW (ref 4.22–5.81)
RDW: 12.9 % (ref 11.5–15.5)
WBC: 5.7 10*3/uL (ref 4.0–10.5)
nRBC: 0 % (ref 0.0–0.2)

## 2023-11-24 LAB — PHOSPHORUS: Phosphorus: 1.8 mg/dL — ABNORMAL LOW (ref 2.5–4.6)

## 2023-11-24 LAB — GLUCOSE, CAPILLARY: Glucose-Capillary: 122 mg/dL — ABNORMAL HIGH (ref 70–99)

## 2023-11-24 LAB — BASIC METABOLIC PANEL
Anion gap: 10 (ref 5–15)
BUN: 7 mg/dL (ref 6–20)
CO2: 22 mmol/L (ref 22–32)
Calcium: 8 mg/dL — ABNORMAL LOW (ref 8.9–10.3)
Chloride: 105 mmol/L (ref 98–111)
Creatinine, Ser: 0.59 mg/dL — ABNORMAL LOW (ref 0.61–1.24)
GFR, Estimated: >60 mL/min (ref >60)
Glucose, Bld: 133 mg/dL — ABNORMAL HIGH (ref 70–99)
Potassium: 3.1 mmol/L — ABNORMAL LOW (ref 3.5–5.1)
Sodium: 137 mmol/L (ref 135–145)

## 2023-11-24 LAB — MAGNESIUM: Magnesium: 1.9 mg/dL (ref 1.7–2.4)

## 2023-11-24 MED ORDER — POTASSIUM CHLORIDE 20 MEQ PO PACK
40.0000 meq | PACK | Freq: Two times a day (BID) | ORAL | Status: AC
  Administered 2023-11-24 (×2): 40 meq
  Filled 2023-11-24 (×2): qty 2

## 2023-11-24 MED ORDER — METOPROLOL TARTRATE 5 MG/5ML IV SOLN: 2.5000 mg | Freq: Once | INTRAVENOUS | Status: AC

## 2023-11-24 MED ORDER — GLYCOPYRROLATE 0.2 MG/ML IJ SOLN
0.1000 mg | Freq: Three times a day (TID) | INTRAMUSCULAR | Status: DC
  Administered 2023-11-24 – 2023-11-27 (×9): 0.1 mg via INTRAVENOUS
  Filled 2023-11-24 (×9): qty 1

## 2023-11-24 MED ORDER — MAGNESIUM SULFATE 2 GM/50ML IV SOLN
2.0000 g | Freq: Once | INTRAVENOUS | Status: AC
  Administered 2023-11-24: 2 g via INTRAVENOUS
  Filled 2023-11-24: qty 50

## 2023-11-24 MED ORDER — METOPROLOL TARTRATE 5 MG/5ML IV SOLN
INTRAVENOUS | Status: AC
  Administered 2023-11-24: 2.5 mg via INTRAVENOUS
  Filled 2023-11-24: qty 5

## 2023-11-24 MED ORDER — POTASSIUM & SODIUM PHOSPHATES 280-160-250 MG PO PACK
1.0000 | PACK | Freq: Three times a day (TID) | ORAL | Status: AC
Start: 2023-11-24 — End: 2023-11-24
  Administered 2023-11-24 (×3): 1
  Filled 2023-11-24 (×3): qty 1
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
## 2023-11-24 NOTE — Plan of Care (Signed)
 Continuing with plan of care.

## 2023-11-24 NOTE — Progress Notes (Signed)
Order received from Dr. Karna Christmas for Robinol 0.1mg  IV three times daily for two days.
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
## 2023-11-24 NOTE — Plan of Care (Signed)
  Problem: Fluid Volume: Goal: Hemodynamic stability will improve Outcome: Not Progressing   Problem: Clinical Measurements: Goal: Diagnostic test results will improve Outcome: Not Progressing Goal: Signs and symptoms of infection will decrease Outcome: Not Progressing   Problem: Respiratory: Goal: Ability to maintain adequate ventilation will improve Outcome: Not Progressing   Problem: Activity: Goal: Ability to tolerate increased activity will improve Outcome: Not Progressing   Problem: Respiratory: Goal: Ability to maintain a clear airway and adequate ventilation will improve Outcome: Not Progressing

## 2023-11-24 NOTE — Progress Notes (Signed)
Patient's heart rate is sustained in the 120's; Dr. Karna Christmas notified and order received for metoprolol 2.5mg  IV x 1.

## 2023-11-25 LAB — BASIC METABOLIC PANEL
Anion gap: 9 (ref 5–15)
BUN: 10 mg/dL (ref 6–20)
CO2: 23 mmol/L (ref 22–32)
Calcium: 8.4 mg/dL — ABNORMAL LOW (ref 8.9–10.3)
Chloride: 111 mmol/L (ref 98–111)
Creatinine, Ser: 0.47 mg/dL — ABNORMAL LOW (ref 0.61–1.24)
GFR, Estimated: >60 mL/min (ref >60)
Glucose, Bld: 108 mg/dL — ABNORMAL HIGH (ref 70–99)
Potassium: 4.1 mmol/L (ref 3.5–5.1)
Sodium: 143 mmol/L (ref 135–145)

## 2023-11-25 LAB — PHOSPHORUS: Phosphorus: 1.8 mg/dL — ABNORMAL LOW (ref 2.5–4.6)

## 2023-11-25 LAB — CBC
HCT: 36.2 % — ABNORMAL LOW (ref 39.0–52.0)
Hemoglobin: 11.9 g/dL — ABNORMAL LOW (ref 13.0–17.0)
MCH: 31.3 pg (ref 26.0–34.0)
MCHC: 32.9 g/dL (ref 30.0–36.0)
MCV: 95.3 fL (ref 80.0–100.0)
Platelets: 381 10*3/uL (ref 150–400)
RBC: 3.8 MIL/uL — ABNORMAL LOW (ref 4.22–5.81)
RDW: 13.6 % (ref 11.5–15.5)
WBC: 8.2 10*3/uL (ref 4.0–10.5)
nRBC: 0 % (ref 0.0–0.2)

## 2023-11-25 LAB — GLUCOSE, CAPILLARY
Glucose-Capillary: 105 mg/dL — ABNORMAL HIGH (ref 70–99)
Glucose-Capillary: 121 mg/dL — ABNORMAL HIGH (ref 70–99)
Glucose-Capillary: 133 mg/dL — ABNORMAL HIGH (ref 70–99)

## 2023-11-25 LAB — TRIGLYCERIDES: Triglycerides: 205 mg/dL — ABNORMAL HIGH (ref <150)

## 2023-11-25 LAB — MAGNESIUM: Magnesium: 2.3 mg/dL (ref 1.7–2.4)

## 2023-11-25 MED ORDER — SODIUM PHOSPHATES 45 MMOLE/15ML IV SOLN
30.0000 mmol | Freq: Once | INTRAVENOUS | Status: AC
  Administered 2023-11-25: 30 mmol via INTRAVENOUS
  Filled 2023-11-25: qty 10

## 2023-11-25 MED ORDER — LACTATED RINGERS IV BOLUS
250.0000 mL | Freq: Once | INTRAVENOUS | Status: AC
Start: 2023-11-25 — End: 2023-11-25
  Administered 2023-11-25: 250 mL via INTRAVENOUS

## 2023-11-25 MED ORDER — DEXMEDETOMIDINE HCL IN NACL 400 MCG/100ML IV SOLN
INTRAVENOUS | Status: AC
  Administered 2023-11-25: 0.4 ug/kg/h via INTRAVENOUS
  Filled 2023-11-25: qty 100

## 2023-11-25 MED ORDER — DEXMEDETOMIDINE HCL IN NACL 400 MCG/100ML IV SOLN
0.0000 ug/kg/h | INTRAVENOUS | Status: DC
  Administered 2023-11-27: 0.5 ug/kg/h via INTRAVENOUS
  Filled 2023-11-25 (×5): qty 100

## 2023-11-25 MED ORDER — MIDODRINE HCL 5 MG PO TABS
10.0000 mg | ORAL_TABLET | Freq: Once | ORAL | Status: AC
  Administered 2023-11-25: 10 mg
  Filled 2023-11-25: qty 2

## 2023-11-25 MED ORDER — FUROSEMIDE 10 MG/ML IJ SOLN
40.0000 mg | Freq: Once | INTRAMUSCULAR | Status: AC
  Administered 2023-11-25: 40 mg via INTRAVENOUS
  Filled 2023-11-25: qty 4

## 2023-11-25 NOTE — Progress Notes (Signed)
AT 1232 RT placed patient in SVT mode, all sedation off at this time, feeds stopped as well.  By 1240 patient's respirations were up to the 40s and 50s and HR up to the 120s and 130s, at that time patient was switched back to previous ventilator settings by RT and sedation restarted.  Dr. Karna Christmas notified.

## 2023-11-25 NOTE — Plan of Care (Signed)
 Continuing with plan of care.

## 2023-11-25 NOTE — Progress Notes (Signed)
Patient continues with elevate heart rate, Dr. Karna Christmas notified and orders received to take patient off of propofol and start precidex.

## 2023-11-25 NOTE — Progress Notes (Signed)
Requested missing dose of valproic acid from pharmacy; per pharmacy they are awaiting medication from [facility].

## 2023-11-25 NOTE — Progress Notes (Signed)
Patient continues with elevated heart rate sustained in the 130s, Dr. Karna Christmas notified and order received for LR bolus.
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
## 2023-11-25 NOTE — Progress Notes (Signed)
NAME:  Alex [name], MRN:  [id number], DOB:  04-06-1996, LOS: 6 ADMISSION DATE:  11/19/2023, CONSULTATION DATE:  11/20/23 REFERRING MD:  Dr. Arville Care, CHIEF COMPLAINT:  AMS   History of Present Illness:  28 yo M presenting to [facility] ED from outpatient rehab on 11/19/23 for evaluation of altered mental status.  History obtained per chart review and mother's telephone interview, patient unable to participate in interview due to respiratory distress and baseline TBI. 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 He was treated at [facility] for 5 months then discharged to rehab. Per his mother and legal guardian his baseline is: Non verbal but he will yell out sporadic nonsensical words with left sided paralysis. He is able to move his RUE in order to feed himself with finger foods and he has some involuntary movement with that arm, he will swat at you. Similar baseline function with RLE, he can kick and move, but often will lay it bent and to the side. He has enough strength to even try and get out of bed on the right side. She denies any issues with swallowing, but eats mostly soft food. If he is not watched closely with eating he will try to eat all the food at once. The facility staff reported he was at his normal baseline until lunchtime on 11/19/23. It was observed he was more somnolent and not interactive. Staff noted a cough and slightly increased work of breathing. Mom also confirmed noting a congested cough the last time she visited earlier in the week. Staff and mom denied nausea/ vomiting, but mom reports chronic loose stools.  EMS reported the patient febrile and tachycardic on arrival.  ED course: Upon arrival patient tachycardic and lethargic. Sepsis protocol initiated with antibiotics and IVF resuscitation. Labs significant for mild hypokalemia, otherwise WNL. Initial imaging unremarkable but then patient became hypoxic with SpO2 85% on RA and a CTa was obtained, negative  for PE but concerning for aspiration pneumonia. TRH consulted for admission. While patient was pending admission in ED he became increasingly hypoxic in the 60's, placed on a NRB without improvement. PCCM alerted the need to emergently intubate the patient. Medications given: Cefepime, Flagyl, Vancomycin, 2 L IVF bolus, IV contrast Initial Vitals: 97.9, 17, 130, 119/82 & 92 on RA Significant labs: (Labs/ Imaging personally reviewed) EKG : pending Chemistry: Na+: 138, K+: 3.4, BUN/Cr.: 13/ 0.65, Serum CO2/ AG: 26/8 Hematology: WBC: 4.3, Hgb: 14.3,  Lactic/ PCT: 1.1 > 1.4 / pending, COVID-19 & Influenza A/B: negative  ABG: pending post intubation  CXR 11/19/23: no active disease CT head wo contrast 11/19/23: No evidence of acute intracranial abnormality. Stable multifocal encephalomalacia, likely the sequela of prior trauma. Similar age-advanced cerebral atrophy CT angio chest PE 11/19/23:  No pulmonary embolus. Near complete filling of the right mainstem bronchus with filling extending into the bronchus intermedius, right upper, middle and lower lobe bronchi. Layering debris in the trachea. Findings are highly suspicious for aspiration. Minimal ill-defined patchy and nodular airspace disease in the dependent right lower lobe, likely postobstructive pneumonia.  PCCM consulted for assistance in management and monitoring due to acute hypoxic respiratory failure requiring urgent intubation and mechanical ventilatory support secondary to suspected aspiration and pneumonia.  Pertinent  Medical History  Seizures TBI (09/2019)  Significant Hospital Events: Including procedures, antibiotic start and stop dates in addition to other pertinent events   11/20/23: Admit to ICU due to acute hypoxic respiratory failure requiring urgent intubation  and mechanical ventilatory support secondary to suspected aspiration and pneumonia 11/21/23- patient moving RUE, mother at bedside we reviewed medical plan. He remains on  53mcg/kg/hr levophed, today plan to rescusitate more aggresively and wean from levophed and potentially extubate post SBP.   He is febrile this am.  He is on zithromax, unasyn 11/22/23- s/p bronch yesterday with aspiration of mucus plugging.  Today resp status improved with liberation protocol in proces. SLP post extubation today. 11/23/23- patient is +for pseudomonas resp cultures.  He is also MRSA pcr +, refined therapy during rounds with pharmacist. He remains on MV 11/24/23- patient is still on vasopressor support weaning down on MV.  Secretions are slightly better.  11/25/23- patient weaned off levophed, failed SBT with tachypnea, tachycardia.  Secretions much improved.   Objective   Blood pressure 98/64, pulse (!) 111, temperature 97.7 F (36.5 C), temperature source Bladder, resp. rate 16, height 5\' 9"  (1.753 m), weight 70 kg, SpO2 97%.    Vent Mode: PRVC FiO2 (%):  [30 %] 30 % Set Rate:  [16 bmp] 16 bmp Vt Set:  [550 mL] 550 mL PEEP:  [5 cmH20] 5 cmH20 Plateau Pressure:  [15 cmH20-16 cmH20] 16 cmH20   Intake/Output Summary (Last 24 hours) at 11/25/2023 1031 Last data filed at 11/25/2023 1000 Gross per 24 hour  Intake 2144.67 ml  Output 2965 ml  Net -820.33 ml   Filed Weights   11/21/23 0422 11/22/23 0425 11/23/23 0415  Weight: 69.4 kg 70.2 kg 70 kg    Examination: General: Adult male on MV HEENT: MM pink/moist, anicteric, atraumatic, neck supple Neuro: RASS: -4, unable to follow commands, PERRL +3, moving RUE/RLE (baseline) CV: s1s2 RRR, ST on monitor, no r/m/g Pulm: Regular, labored on NRB, breath sounds coarse-BUL & diminished-BLL GI: soft, rounded, non tender, bs x 4 Skin: no rashes/lesions noted Extremities: warm/dry, pulses + 2 R/P, no edema noted    Assessment & Plan:    Acute Hypoxic Respiratory Failure secondary to suspected aspiration and possible post obstructive pneumonia in the setting of TBI - on MV - Low PEEP ladder PRVC, compliance and resistance is  improved -continue abx - Strict I/O's: alert provider if UOP < 0.5 mL/kg/hr - continuous cardiac monitoring - aspiration precautions - Pseudomonas +tracheal aspirate   Mild Hypokalemia-repleted - K+ supplementation ordered - Daily BMP, replace electrolytes PRN   Chronic Seizure Disorder Recent valproic acid level was elevated. - f/u valproic acid & lacosamide levels - continue outpatient regimen: valproic acid & lacosamide for now - monitor neurological status - seizure precautions  Chronic TBI - baclofen PRN continued, hold gabapentin for now - consider restarting outpatient Seroquel depending on EKG - supportive care  Best Practice (right click and "Reselect all SmartList Selections" daily)  Diet/type: NPO w/ meds via tube DVT prophylaxis LMWH Pressure ulcer(s): N/A GI prophylaxis: H2B Lines: N/A Foley:  Yes, and it is still needed Code Status:  full code Last date of multidisciplinary goals of care discussion [11/20/23]  Labs   CBC: Recent Labs  Lab 11/19/23 1655 11/20/23 0450 11/21/23 0425 11/22/23 0402 11/23/23 0356 11/24/23 0427 11/25/23 0519  WBC 4.3   < > 7.5 8.0 8.1 5.7 8.2  NEUTROABS 2.8  --   --   --   --   --   --   HGB 14.3   < > 11.1* 12.1* 13.3 11.3* 11.9*  HCT 41.9   < > 31.6* 35.3* 39.4 33.3* 36.2*  MCV 92.1   < > 88.3 92.2  92.1 91.0 95.3  PLT 214   < > 255 267 301 260 381   < > = values in this interval not displayed.    Basic Metabolic Panel: Recent Labs  Lab 11/21/23 0425 11/22/23 0402 11/23/23 0356 11/24/23 0427 11/25/23 0519  NA 136 140 137 137 143  K 3.2* 3.8 4.0 3.1* 4.1  CL 103 104 102 105 111  CO2 26 25 22 22 23   GLUCOSE 125* 98 136* 133* 108*  BUN <5* <5* 6 7 10   CREATININE 0.51* 0.61 0.54* 0.59* 0.47*  CALCIUM 7.8* 8.3* 8.5* 8.0* 8.4*  MG 1.8 1.8 2.1 1.9 2.3  PHOS 2.1* 3.4 3.0 1.8* 1.8*   GFR: Estimated Creatinine Clearance: 136.1 mL/min (A) (by C-G formula based on SCr of 0.47 mg/dL (L)). Recent Labs  Lab  11/19/23 1656 11/19/23 1935 11/20/23 0450 11/20/23 1000 11/21/23 0425 11/22/23 0402 11/23/23 0356 11/24/23 0427 11/25/23 0519  PROCALCITON  --   --  <0.10  <0.10  --  0.17  --   --   --   --   WBC  --   --  5.5  --  7.5 8.0 8.1 5.7 8.2  LATICACIDVEN 1.1 1.4 1.4 1.4  --   --   --   --   --     Liver Function Tests: Recent Labs  Lab 11/19/23 1941  AST 21  ALT 9  ALKPHOS 47  BILITOT 1.0  PROT 5.6*  ALBUMIN 2.6*   No results for input(s): "LIPASE", "AMYLASE" in the last 168 hours. No results for input(s): "AMMONIA" in the last 168 hours.  ABG    Component Value Date/Time   PHART 7.41 11/22/2023 1753   PCO2ART 37 11/22/2023 1753   PO2ART 177 (H) 11/22/2023 1753   HCO3 23.5 11/22/2023 1753   TCO2 28 05/31/2021 0404   ACIDBASEDEF 0.8 11/22/2023 1753   O2SAT 99.8 11/22/2023 1753     Coagulation Profile: Recent Labs  Lab 11/19/23 1940 11/20/23 0450  INR 1.2 1.2    Cardiac Enzymes: No results for input(s): "CKTOTAL", "CKMB", "CKMBINDEX", "TROPONINI" in the last 168 hours.  HbA1C: Hgb A1c MFr Bld  Date/Time Value Ref Range Status  04/11/2022 06:10 AM 5.1 4.8 - 5.6 % Final    Comment:    (NOTE) Pre diabetes:          5.7%-6.4%  Diabetes:              >6.4%  Glycemic control for   <7.0% adults with diabetes   05/31/2021 04:18 PM 4.9 4.8 - 5.6 % Final    Comment:    (NOTE) Pre diabetes:          5.7%-6.4%  Diabetes:              >6.4%  Glycemic control for   <7.0% adults with diabetes     CBG: Recent Labs  Lab 11/23/23 0359 11/23/23 0727 11/23/23 1130 11/23/23 2114 11/24/23 2352  GLUCAP 127* 127* 102* 102* 122*    Review of Systems:   UTA- patient non verbal at baseline, and in respiratory distress  Past Medical History:  He,  has a past medical history of 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   Surgical History:   Past Surgical History:   Procedure Laterality Date   GASTROSTOMY TUBE PLACEMENT       Social History:   reports that he has never smoked. He has never  used smokeless tobacco. He reports that he does not currently use alcohol. He reports that he does not use drugs.   Family History:  His family history is not on file.   Allergies No Known Allergies   Home Medications  Prior to Admission medications   Medication Sig Start Date End Date Taking? Authorizing Provider  melatonin 5 MG TABS Take 10 mg by mouth at bedtime.   Yes [provider]  QUEtiapine (SEROQUEL XR) 200 MG 24 hr tablet Take 200 mg by mouth in the morning.   Yes [provider]  QUEtiapine (SEROQUEL) 25 MG tablet Take 25 mg by mouth at bedtime.   Yes [provider]  valproic acid (DEPAKENE) 250 MG capsule Take 750 mg by mouth every evening.   Yes [provider]  acetaminophen (TYLENOL) 160 MG/5ML solution Place 20.3 mLs (650 mg total) into feeding tube every 6 (six) hours as needed for mild pain, moderate pain, headache or fever. Patient not taking: Reported on 09/12/2021 06/06/21   Rolly Salter, MD  baclofen (LIORESAL) 10 MG tablet Take 10 mg by mouth 3 (three) times daily.   Yes [provider]  diazePAM, 15 MG Dose, (VALTOCO 15 MG DOSE) 2 x 7.5 MG/0.1ML LQPK Spray 7.5 mg into each nostril in the event of a seizure 04/20/22  Yes Zigmund Daniel., MD  gabapentin (NEURONTIN) 400 MG capsule Take 1 capsule (400 mg total) by mouth 3 (three) times daily. 04/20/22 11/20/23 Yes Zigmund Daniel., MD  lacosamide 100 MG TABS Take 1 tablet (100 mg total) by mouth 2 (two) times daily. 04/20/22 11/20/23 Yes Zigmund Daniel., MD  melatonin 3 MG TABS tablet Place 2 tablets (6 mg total) into feeding tube at bedtime. Patient not taking: Reported on 11/20/2023 06/04/21   Rolly Salter, MD  polyethylene glycol (MIRALAX / GLYCOLAX) 17 g packet Place 17 g into feeding tube daily. Patient taking differently:  Take 17 g by mouth daily. 06/04/21  Yes Rolly Salter, MD  QUEtiapine (SEROQUEL) 100 MG tablet Take 100 mg by mouth daily. Patient not taking: Reported on 11/20/2023    [provider]  senna (SENOKOT) 8.6 MG TABS tablet Take 8.6 mg by mouth at bedtime. Patient not taking: Reported on 11/20/2023    [provider]  triamcinolone ointment (KENALOG) 0.1 % Apply topically 2 (two) times daily. Patient not taking: Reported on 09/12/2021 06/03/21   Rolly Salter, MD  valproic acid (DEPAKENE) 250 MG capsule Take 4 capsules (1,000 mg total) by mouth 2 (two) times daily. Patient taking differently: Take 500 mg by mouth every morning. 04/20/22 11/20/23 Yes Zigmund Daniel., MD     Critical care provider statement:   Total critical care time: 33 minutes   Performed by: Karna Christmas MD   Critical care time was exclusive of separately billable procedures and treating other patients.   Critical care was necessary to treat or prevent imminent or life-threatening deterioration.   Critical care was time spent personally by me on the following activities: development of treatment plan with patient and/or surrogate as well as nursing, discussions with consultants, evaluation of patient's response to treatment, examination of patient, obtaining history from patient or surrogate, ordering and performing treatments and interventions, ordering and review of laboratory studies, ordering and review of radiographic studies, pulse oximetry and re-evaluation of patient's condition.    Vida Rigger, M.D.  Pulmonary & Critical Care Medicine

## 2023-11-25 NOTE — Progress Notes (Signed)
Patient has a low grade temperature that is 100 accompanied by tachycardia in the 120s; Dr. Karna Christmas update and ok to give Tylenol.

## 2023-11-26 DIAGNOSIS — G9341 Metabolic encephalopathy: Secondary | ICD-10-CM | POA: Diagnosis not present

## 2023-11-26 DIAGNOSIS — B965 Pseudomonas (aeruginosa) (mallei) (pseudomallei) as the cause of diseases classified elsewhere: Secondary | ICD-10-CM | POA: Diagnosis not present

## 2023-11-26 DIAGNOSIS — J9601 Acute respiratory failure with hypoxia: Secondary | ICD-10-CM | POA: Diagnosis not present

## 2023-11-26 DIAGNOSIS — J690 Pneumonitis due to inhalation of food and vomit: Secondary | ICD-10-CM | POA: Diagnosis not present

## 2023-11-26 DIAGNOSIS — L8990 Pressure ulcer of unspecified site, unspecified stage: Secondary | ICD-10-CM

## 2023-11-26 LAB — BASIC METABOLIC PANEL
Anion gap: 10 (ref 5–15)
BUN: 13 mg/dL (ref 6–20)
CO2: 25 mmol/L (ref 22–32)
Calcium: 8.1 mg/dL — ABNORMAL LOW (ref 8.9–10.3)
Chloride: 103 mmol/L (ref 98–111)
Creatinine, Ser: 0.45 mg/dL — ABNORMAL LOW (ref 0.61–1.24)
GFR, Estimated: >60 mL/min (ref >60)
Glucose, Bld: 146 mg/dL — ABNORMAL HIGH (ref 70–99)
Potassium: 3.6 mmol/L (ref 3.5–5.1)
Sodium: 138 mmol/L (ref 135–145)

## 2023-11-26 LAB — GLUCOSE, CAPILLARY
Glucose-Capillary: 126 mg/dL — ABNORMAL HIGH (ref 70–99)
Glucose-Capillary: 116 mg/dL — ABNORMAL HIGH (ref 70–99)
Glucose-Capillary: 117 mg/dL — ABNORMAL HIGH (ref 70–99)
Glucose-Capillary: 94 mg/dL (ref 70–99)
Glucose-Capillary: 98 mg/dL (ref 70–99)
Glucose-Capillary: 100 mg/dL — ABNORMAL HIGH (ref 70–99)
Glucose-Capillary: 107 mg/dL — ABNORMAL HIGH (ref 70–99)
Glucose-Capillary: 109 mg/dL — ABNORMAL HIGH (ref 70–99)
Glucose-Capillary: 111 mg/dL — ABNORMAL HIGH (ref 70–99)
Glucose-Capillary: 128 mg/dL — ABNORMAL HIGH (ref 70–99)
Glucose-Capillary: 90 mg/dL (ref 70–99)
Glucose-Capillary: 99 mg/dL (ref 70–99)
Glucose-Capillary: 98 mg/dL (ref 70–99)

## 2023-11-26 LAB — CBC
HCT: 34.9 % — ABNORMAL LOW (ref 39.0–52.0)
Hemoglobin: 11.8 g/dL — ABNORMAL LOW (ref 13.0–17.0)
MCH: 31.6 pg (ref 26.0–34.0)
MCHC: 33.8 g/dL (ref 30.0–36.0)
MCV: 93.6 fL (ref 80.0–100.0)
Platelets: 512 10*3/uL — ABNORMAL HIGH (ref 150–400)
RBC: 3.73 MIL/uL — ABNORMAL LOW (ref 4.22–5.81)
RDW: 13.1 % (ref 11.5–15.5)
WBC: 11.7 10*3/uL — ABNORMAL HIGH (ref 4.0–10.5)
nRBC: 0.2 % (ref 0.0–0.2)

## 2023-11-26 LAB — PHOSPHORUS: Phosphorus: 2.7 mg/dL (ref 2.5–4.6)

## 2023-11-26 LAB — MAGNESIUM: Magnesium: 1.9 mg/dL (ref 1.7–2.4)

## 2023-11-26 MED ORDER — POTASSIUM CHLORIDE 20 MEQ PO PACK
40.0000 meq | PACK | Freq: Once | ORAL | Status: AC
  Administered 2023-11-26: 40 meq
  Filled 2023-11-26: qty 2

## 2023-11-26 MED ORDER — IPRATROPIUM-ALBUTEROL 0.5-2.5 (3) MG/3ML IN SOLN: 3.0000 mL | RESPIRATORY_TRACT | Status: DC | PRN

## 2023-11-26 MED ORDER — NOREPINEPHRINE 4 MG/250ML-% IV SOLN
0.0000 ug/min | INTRAVENOUS | Status: DC
  Administered 2023-11-26 (×2): 4 ug/min via INTRAVENOUS
  Filled 2023-11-26 (×2): qty 250

## 2023-11-26 NOTE — Plan of Care (Signed)
  Problem: Fluid Volume: Goal: Hemodynamic stability will improve Outcome: Progressing   Problem: Clinical Measurements: Goal: Diagnostic test results will improve Outcome: Progressing   Problem: Clinical Measurements: Goal: Signs and symptoms of infection will decrease Outcome: Progressing   Problem: Respiratory: Goal: Ability to maintain adequate ventilation will improve Outcome: Progressing   Problem: Activity: Goal: Ability to tolerate increased activity will improve Outcome: Progressing   Problem: Education: Goal: Knowledge of General Education information will improve Description: Including pain rating scale, medication(s)/side effects and non-pharmacologic comfort measures Outcome: Not Progressing   Problem: Health Behavior/Discharge Planning: Goal: Ability to manage health-related needs will improve Outcome: Not Progressing
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
## 2023-11-26 NOTE — Progress Notes (Signed)
Patient on ventilator support, pressures stable with pressors, afebrile. Patient unable to follow commands, left sided paralysis at baseline. Patient with purposeful movement with right arm, minimal movement to pain with right lower extremity. Per mom, patient unable to follow commands at baseline. Pupils left 5/right 4, round & reactive. MD made aware.

## 2023-11-26 NOTE — Progress Notes (Signed)
NAME:  Alex [name], MRN:  [id number], DOB:  04/02/1996, LOS: 7 ADMISSION DATE:  11/19/2023, CONSULTATION DATE:  11/20/23 REFERRING MD:  Dr. Arville Care, CHIEF COMPLAINT:  AMS   Brief Pt Description / Synopsis:  28 y.o. male with PMHx significant for chronic TBI with left sided hemiparesis admitted with Acute Metabolic Encephalopathy and Acute Hypoxic Respiratory Failure in the setting of aspiration and Pseudomonas and MRSA Pneumonia requiring intubation and mechanical ventilation.   History of Present Illness:  28 yo M presenting to [facility] ED from outpatient rehab on 11/19/23 for evaluation of altered mental status.   History obtained per chart review and mother's telephone interview, patient unable to participate in interview due to respiratory distress and baseline TBI. 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 He was treated at [facility] for 5 months then discharged to rehab. Per his mother and legal guardian his baseline is: Non verbal but he will yell out sporadic nonsensical words with left sided paralysis. He is able to move his RUE in order to feed himself with finger foods and he has some involuntary movement with that arm, he will swat at you. Similar baseline function with RLE, he can kick and move, but often will lay it bent and to the side. He has enough strength to even try and get out of bed on the right side. She denies any issues with swallowing, but eats mostly soft food. If he is not watched closely with eating he will try to eat all the food at once. The facility staff reported he was at his normal baseline until lunchtime on 11/19/23. It was observed he was more somnolent and not interactive. Staff noted a cough and slightly increased work of breathing. Mom also confirmed noting a congested cough the last time she visited earlier in the week. Staff and mom denied nausea/ vomiting, but mom reports chronic loose stools.   EMS reported the patient febrile and  tachycardic on arrival.   ED course: Upon arrival patient tachycardic and lethargic. Sepsis protocol initiated with antibiotics and IVF resuscitation. Labs significant for mild hypokalemia, otherwise WNL. Initial imaging unremarkable but then patient became hypoxic with SpO2 85% on RA and a CTa was obtained, negative for PE but concerning for aspiration pneumonia. TRH consulted for admission. While patient was pending admission in ED he became increasingly hypoxic in the 60's, placed on a NRB without improvement. PCCM alerted the need to emergently intubate the patient. Medications given: Cefepime, Flagyl, Vancomycin, 2 L IVF bolus, IV contrast Initial Vitals: 97.9, 17, 130, 119/82 & 92 on RA Significant labs: (Labs/ Imaging personally reviewed) EKG : pending Chemistry: Na+: 138, K+: 3.4, BUN/Cr.: 13/ 0.65, Serum CO2/ AG: 26/8 Hematology: WBC: 4.3, Hgb: 14.3,  Lactic/ PCT: 1.1 > 1.4 / pending, COVID-19 & Influenza A/B: negative   ABG: pending post intubation   CXR 11/19/23: no active disease CT head wo contrast 11/19/23: No evidence of acute intracranial abnormality. Stable multifocal encephalomalacia, likely the sequela of prior trauma. Similar age-advanced cerebral atrophy CT angio chest PE 11/19/23:  No pulmonary embolus. Near complete filling of the right mainstem bronchus with filling extending into the bronchus intermedius, right upper, middle and lower lobe bronchi. Layering debris in the trachea. Findings are highly suspicious for aspiration. Minimal ill-defined patchy and nodular airspace disease in the dependent right lower lobe, likely postobstructive pneumonia.   PCCM consulted for assistance in management and monitoring due to acute hypoxic respiratory failure requiring urgent  intubation and mechanical ventilatory support secondary to suspected aspiration and pneumonia.  Please see "Significant Hospital Events" section below for full detailed hospital course.   Pertinent  Medical  History  Seizures TBI (09/2019)  Micro Data:  1/27: SARS-CoV-2/flu/RSV PCR>> negative 1/27: Blood culture x 2>> no growth 1/28: Tracheal aspirate>> Pseudomonas aeruginosa and Staphylococcus aureus 1/28: HIV screen>> nonreactive 1/29: BAL>> Pseudomonas aeruginosa &  MRSA  Antimicrobials:   Anti-infectives (From admission, onward)    Start     Dose/Rate Route Frequency Ordered Stop   11/23/23 1500  levofloxacin (LEVAQUIN) IVPB 750 mg        750 mg 100 mL/hr over 90 Minutes Intravenous Every 24 hours 11/23/23 1341     11/21/23 2200  doxycycline (VIBRAMYCIN) 100 mg in sodium chloride 0.9 % 250 mL IVPB        100 mg 125 mL/hr over 120 Minutes Intravenous Every 12 hours 11/21/23 1425     11/21/23 2000  metroNIDAZOLE (FLAGYL) IVPB 500 mg  Status:  Discontinued        500 mg 100 mL/hr over 60 Minutes Intravenous Every 12 hours 11/21/23 1425 11/24/23 1227   11/20/23 1200  Ampicillin-Sulbactam (UNASYN) 3 g in sodium chloride 0.9 % 100 mL IVPB  Status:  Discontinued        3 g 200 mL/hr over 30 Minutes Intravenous Every 6 hours 11/20/23 1131 11/21/23 1423   11/20/23 0800  cefTRIAXone (ROCEPHIN) 2 g in sodium chloride 0.9 % 100 mL IVPB  Status:  Discontinued        2 g 200 mL/hr over 30 Minutes Intravenous Every 24 hours 11/19/23 2322 11/20/23 0229   11/20/23 0100  azithromycin (ZITHROMAX) 500 mg in sodium chloride 0.9 % 250 mL IVPB  Status:  Discontinued        500 mg 250 mL/hr over 60 Minutes Intravenous Every 24 hours 11/19/23 2322 11/21/23 1423   11/19/23 1545  ceFEPIme (MAXIPIME) 2 g in sodium chloride 0.9 % 100 mL IVPB        2 g 200 mL/hr over 30 Minutes Intravenous  Once 11/19/23 1541 11/19/23 1630   11/19/23 1545  metroNIDAZOLE (FLAGYL) IVPB 500 mg        500 mg 100 mL/hr over 60 Minutes Intravenous  Once 11/19/23 1541 11/19/23 1748   11/19/23 1545  vancomycin (VANCOCIN) IVPB 1000 mg/200 mL premix        1,000 mg 200 mL/hr over 60 Minutes Intravenous  Once 11/19/23 1541  11/19/23 1917       Significant Hospital Events: Including procedures, antibiotic start and stop dates in addition to other pertinent events   11/20/23: Admit to ICU due to acute hypoxic respiratory failure requiring urgent intubation and mechanical ventilatory support secondary to suspected aspiration and pneumonia 11/21/23- patient moving RUE, mother at bedside we reviewed medical plan. He remains on 21mcg/kg/hr levophed, today plan to rescusitate more aggresively and wean from levophed and potentially extubate post SBP.   He is febrile this am.  He is on zithromax, unasyn 11/22/23- s/p bronch yesterday with aspiration of mucus plugging.  Today resp status improved with liberation protocol in proces. SLP post extubation today. 11/23/23- patient is +for pseudomonas resp cultures.  He is also MRSA pcr +, refined therapy during rounds with pharmacist. He remains on MV 11/24/23- patient is still on vasopressor support weaning down on MV.  Secretions are slightly better.  11/25/23- patient weaned off levophed, failed SBT with tachypnea, tachycardia.  Secretions much improved. 11/26/23- on  minimal vent support, unable to perform SBT due to copious secretions from ETT.    Interim History / Subjective:  As outlined above in significant hospital events section  Objective   Blood pressure 115/70, pulse 81, temperature 99.7 F (37.6 C), resp. rate 15, height 5\' 9"  (1.753 m), weight 62.8 kg, SpO2 99%.    Vent Mode: PRVC FiO2 (%):  [30 %] 30 % Set Rate:  [16 bmp] 16 bmp Vt Set:  [550 mL] 550 mL PEEP:  [5 cmH20] 5 cmH20 Pressure Support:  [5 cmH20] 5 cmH20 Plateau Pressure:  [17 cmH20-21 cmH20] 19 cmH20   Intake/Output Summary (Last 24 hours) at 11/26/2023 0825 Last data filed at 11/26/2023 7846 Gross per 24 hour  Intake 1649.8 ml  Output 2820 ml  Net -1170.2 ml   Filed Weights   11/22/23 0425 11/23/23 0415 11/26/23 0500  Weight: 70.2 kg 70 kg 62.8 kg    Examination: General: Acute on chronically  ill-appearing frail male, laying in bed, intubated and sedated, no acute distress HENT: Atraumatic, normocephalic, neck supple, no JVD, orally intubated Lungs: Coarse breath sounds throughout, even, nonlabored, normal effort, synchronous with the vent Cardiovascular: Regular rate and rhythm, S1-S2, no murmurs, rubs, gallops Abdomen: Soft, nontender, nondistended, no guarding rebound tenderness, bowel sounds positive x 4 Extremities: Left upper and lower extremities contractured, bilateral foot drop Neuro: Left-sided hemiparesis with left arm contractured (baseline), opens eyes to voice and tracks GU: External male catheter in place  Resolved Hospital Problem list     Assessment & Plan:   #Acute Hypoxic Respiratory Failure secondary to suspected aspiration and post obstructive pneumonia in the setting of TBI -Full vent support, implement lung protective strategies -Plateau pressures less than 30 cm H20 -Wean FiO2 & PEEP as tolerated to maintain O2 sats >92% -Follow intermittent Chest X-ray & ABG as needed -Spontaneous Breathing Trials when respiratory parameters met and mental status permits -Implement VAP Bundle -Prn Bronchodilators -ABX as above  #Pseudomonas & MRSA Pneumonia -Monitor fever curve -Trend WBC's & Procalcitonin -Follow cultures as above -Continue empiric Doxycycline and Levaquin pending cultures & sensitivities ~ low threshold to broaden ABX should he decline clinically    #Sedation needs in setting of mechanical ventilation #Chronic Seizure Disorder #Chronic TBI Recent valproic acid level was elevated. -Maintain a RASS goal of 0 to -1 -Fentanyl and Precedex as needed to maintain RASS goal -Avoid sedating medications as able -Daily wake up assessment - continue outpatient regimen: valproic acid & lacosamide for now - seizure precautions - baclofen PRN continued, hold gabapentin for now    Best Practice (right click and "Reselect all SmartList Selections"  daily)   Diet/type: NPO DVT prophylaxis: LMWH GI prophylaxis: PPI Lines: N/A Foley:  N/A Code Status:  full code Last date of multidisciplinary goals of care discussion [2/3]  2/3: Will update pt's mother when she arrives at bedside.  Labs   CBC: Recent Labs  Lab 11/19/23 1655 11/20/23 0450 11/22/23 0402 11/23/23 0356 11/24/23 0427 11/25/23 0519 11/26/23 0517  WBC 4.3   < > 8.0 8.1 5.7 8.2 11.7*  NEUTROABS 2.8  --   --   --   --   --   --   HGB 14.3   < > 12.1* 13.3 11.3* 11.9* 11.8*  HCT 41.9   < > 35.3* 39.4 33.3* 36.2* 34.9*  MCV 92.1   < > 92.2 92.1 91.0 95.3 93.6  PLT 214   < > 267 301 260 381 512*   < > =  values in this interval not displayed.    Basic Metabolic Panel: Recent Labs  Lab 11/22/23 0402 11/23/23 0356 11/24/23 0427 11/25/23 0519 11/26/23 0517  NA 140 137 137 143 138  K 3.8 4.0 3.1* 4.1 3.6  CL 104 102 105 111 103  CO2 25 22 22 23 25   GLUCOSE 98 136* 133* 108* 146*  BUN <5* 6 7 10 13   CREATININE 0.61 0.54* 0.59* 0.47* 0.45*  CALCIUM 8.3* 8.5* 8.0* 8.4* 8.1*  MG 1.8 2.1 1.9 2.3 1.9  PHOS 3.4 3.0 1.8* 1.8* 2.7   GFR: Estimated Creatinine Clearance: 122.1 mL/min (A) (by C-G formula based on SCr of 0.45 mg/dL (L)). Recent Labs  Lab 11/19/23 1656 11/19/23 1935 11/20/23 0450 11/20/23 1000 11/21/23 0425 11/22/23 0402 11/23/23 0356 11/24/23 0427 11/25/23 0519 11/26/23 0517  PROCALCITON  --   --  <0.10  <0.10  --  0.17  --   --   --   --   --   WBC  --   --  5.5  --  7.5   < > 8.1 5.7 8.2 11.7*  LATICACIDVEN 1.1 1.4 1.4 1.4  --   --   --   --   --   --    < > = values in this interval not displayed.    Liver Function Tests: Recent Labs  Lab 11/19/23 1941  AST 21  ALT 9  ALKPHOS 47  BILITOT 1.0  PROT 5.6*  ALBUMIN 2.6*   No results for input(s): "LIPASE", "AMYLASE" in the last 168 hours. No results for input(s): "AMMONIA" in the last 168 hours.  ABG    Component Value Date/Time   PHART 7.41 11/22/2023 1753   PCO2ART 37  11/22/2023 1753   PO2ART 177 (H) 11/22/2023 1753   HCO3 23.5 11/22/2023 1753   TCO2 28 05/31/2021 0404   ACIDBASEDEF 0.8 11/22/2023 1753   O2SAT 99.8 11/22/2023 1753     Coagulation Profile: Recent Labs  Lab 11/19/23 1940 11/20/23 0450  INR 1.2 1.2    Cardiac Enzymes: No results for input(s): "CKTOTAL", "CKMB", "CKMBINDEX", "TROPONINI" in the last 168 hours.  HbA1C: Hgb A1c MFr Bld  Date/Time Value Ref Range Status  04/11/2022 06:10 AM 5.1 4.8 - 5.6 % Final    Comment:    (NOTE) Pre diabetes:          5.7%-6.4%  Diabetes:              >6.4%  Glycemic control for   <7.0% adults with diabetes   05/31/2021 04:18 PM 4.9 4.8 - 5.6 % Final    Comment:    (NOTE) Pre diabetes:          5.7%-6.4%  Diabetes:              >6.4%  Glycemic control for   <7.0% adults with diabetes     CBG: Recent Labs  Lab 11/25/23 1611 11/25/23 1958 11/25/23 2351 11/26/23 0406 11/26/23 0728  GLUCAP 133* 109* 121* 111* 128*    Review of Systems:   Unable to assess due to intubation/sedation   Past Medical History:  He,  has a past medical history of 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   Surgical History:   Past Surgical History:  Procedure Laterality Date   GASTROSTOMY TUBE PLACEMENT       Social History:   reports that he has never smoked. He has never used  smokeless tobacco. He reports that he does not currently use alcohol. He reports that he does not use drugs.   Family History:  His family history is not on file.   Allergies No Known Allergies   Home Medications  Prior to Admission medications   Medication Sig Start Date End Date Taking? Authorizing Provider  baclofen (LIORESAL) 10 MG tablet Take 10 mg by mouth 3 (three) times daily.   Yes [provider]  diazePAM, 15 MG Dose, (VALTOCO 15 MG DOSE) 2 x 7.5 MG/0.1ML LQPK Spray 7.5 mg into each nostril in  the event of a seizure 04/20/22  Yes Zigmund Daniel., MD  gabapentin (NEURONTIN) 400 MG capsule Take 1 capsule (400 mg total) by mouth 3 (three) times daily. 04/20/22 11/20/23 Yes Zigmund Daniel., MD  lacosamide 100 MG TABS Take 1 tablet (100 mg total) by mouth 2 (two) times daily. 04/20/22 11/20/23 Yes Zigmund Daniel., MD  melatonin 5 MG TABS Take 10 mg by mouth at bedtime.   Yes [provider]  QUEtiapine (SEROQUEL XR) 200 MG 24 hr tablet Take 200 mg by mouth in the morning.   Yes [provider]  QUEtiapine (SEROQUEL) 25 MG tablet Take 25 mg by mouth at bedtime.   Yes [provider]  valproic acid (DEPAKENE) 250 MG capsule Take 4 capsules (1,000 mg total) by mouth 2 (two) times daily. Patient taking differently: Take 500 mg by mouth every morning. 04/20/22 11/20/23 Yes Zigmund Daniel., MD  valproic acid (DEPAKENE) 250 MG capsule Take 750 mg by mouth every evening.   Yes [provider]     Critical care time: 40 minutes     Harlon Ditty, AGACNP-BC [facility] Prefer epic messenger for cross cover needs If after hours, please call E-link

## 2023-11-26 NOTE — Plan of Care (Signed)
  Problem: Clinical Measurements: Goal: Diagnostic test results will improve Outcome: Progressing   Problem: Clinical Measurements: Goal: Cardiovascular complication will be avoided Outcome: Progressing   Problem: Activity: Goal: Risk for activity intolerance will decrease Outcome: Progressing   Problem: Coping: Goal: Level of anxiety will decrease Outcome: Progressing   Problem: Elimination: Goal: Will not experience complications related to urinary retention Outcome: Progressing   Problem: Safety: Goal: Ability to remain free from injury will improve Outcome: Progressing   Problem: Fluid Volume: Goal: Hemodynamic stability will improve Outcome: Not Progressing   Problem: Respiratory: Goal: Ability to maintain adequate ventilation will improve Outcome: Not Progressing   Problem: Role Relationship: Goal: Method of communication will improve Outcome: Not Progressing   Problem: Education: Goal: Knowledge of General Education information will improve Description: Including pain rating scale, medication(s)/side effects and non-pharmacologic comfort measures Outcome: Not Progressing   Problem: Health Behavior/Discharge Planning: Goal: Ability to manage health-related needs will improve Outcome: Not Progressing

## 2023-11-26 NOTE — TOC Initial Note (Signed)
Transition of Care [facility]) - Initial/Assessment Note    Patient Details  Name: Alex [name] MRN: [id number] Date of Birth: 01/19/1996  Transition of [facility]) CM/SW Contact:    Margarito Liner, LCSW Phone Number: 11/26/2023, 2:03 PM  Clinical Narrative:   Patient is a long-term resident at [facility]. He is currently intubated. CSW will continue to follow progress and facilitate return to SNF once medically stable.              Expected Discharge Plan: Skilled Nursing Facility Barriers to Discharge: Continued Medical Work up   Patient Goals and CMS Choice            Expected Discharge Plan and Services       Living arrangements for the past 2 months: Skilled Nursing Facility                                      Prior Living Arrangements/Services Living arrangements for the past 2 months: Skilled Nursing Facility Lives with:: Facility Resident Patient language and need for interpreter reviewed:: Yes        Need for Family Participation in Patient Care: Yes (Comment) Care giver support system in place?: Yes (comment)   Criminal Activity/Legal Involvement Pertinent to Current Situation/Hospitalization: No - Comment as needed  Activities of Daily Living   ADL Screening (condition at time of admission) Independently performs ADLs?: No Does the patient have a NEW difficulty with bathing/dressing/toileting/self-feeding that is expected to last >3 days?: No Does the patient have a NEW difficulty with getting in/out of bed, walking, or climbing stairs that is expected to last >3 days?: No Does the patient have a NEW difficulty with communication that is expected to last >3 days?: No Is the patient deaf or have difficulty hearing?: No Does the patient have difficulty seeing, even when wearing glasses/contacts?: No Does the patient have difficulty concentrating, remembering, or making decisions?: Yes  Permission Sought/Granted                   Emotional Assessment         Alcohol / Substance Use: Not Applicable Psych Involvement: No (comment)  Admission diagnosis:  Aspiration pneumonia (HCC) [J69.0] Aspiration pneumonia of right lung, unspecified aspiration pneumonia type, unspecified part of lung (HCC) [J69.0] Sepsis, due to unspecified organism, unspecified whether acute organ dysfunction present (HCC) [A41.9] Acute hypoxic respiratory failure (HCC) [J96.01] Patient Active Problem List   Diagnosis Date Noted   Acute hypoxic respiratory failure (HCC) 11/20/2023   Seizure disorder (HCC) 11/20/2023   Sepsis due to pneumonia (HCC) 11/20/2023   Acute metabolic encephalopathy 11/20/2023   Hypokalemia 11/20/2023   Aspiration pneumonia (HCC) 11/19/2023   Fever 04/20/2022   Acute respiratory failure with hypoxia (HCC) 04/12/2022   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   Seizure (HCC) 09/11/2021   Status epilepticus (HCC) 05/31/2021   Endotracheally intubated 05/31/2021   Traumatic brain injury (HCC) 05/31/2021   Malnutrition of moderate degree 05/31/2021   PCP:  [facility]:   [facility] - [place], [place] - [address] [place] [address] Phone: [phone] Fax: [phone]     Social Drivers of Health (SDOH) Social History: SDOH Screenings   Food Insecurity: Patient Unable To Answer (11/20/2023)  Housing: Patient Unable To Answer (11/20/2023)  Transportation Needs: Patient Unable To Answer (11/20/2023)  Utilities: Patient Unable To Answer (11/20/2023)  Financial Resource Strain: Low Risk  (12/02/2021)   Received from [facility]  Tobacco Use: Low Risk  (11/19/2023)   SDOH Interventions:     Readmission Risk Interventions     No data to display

## 2023-11-27 ENCOUNTER — Inpatient Hospital Stay: Payer: Medicaid Other

## 2023-11-27 DIAGNOSIS — G9341 Metabolic encephalopathy: Secondary | ICD-10-CM | POA: Diagnosis not present

## 2023-11-27 DIAGNOSIS — B965 Pseudomonas (aeruginosa) (mallei) (pseudomallei) as the cause of diseases classified elsewhere: Secondary | ICD-10-CM | POA: Diagnosis not present

## 2023-11-27 DIAGNOSIS — J9601 Acute respiratory failure with hypoxia: Secondary | ICD-10-CM | POA: Diagnosis not present

## 2023-11-27 DIAGNOSIS — J690 Pneumonitis due to inhalation of food and vomit: Secondary | ICD-10-CM | POA: Diagnosis not present

## 2023-11-27 LAB — CBC
HCT: 34.1 % — ABNORMAL LOW (ref 39.0–52.0)
Hemoglobin: 11.2 g/dL — ABNORMAL LOW (ref 13.0–17.0)
MCH: 31.2 pg (ref 26.0–34.0)
MCHC: 32.8 g/dL (ref 30.0–36.0)
MCV: 95 fL (ref 80.0–100.0)
Platelets: 437 10*3/uL — ABNORMAL HIGH (ref 150–400)
RBC: 3.59 MIL/uL — ABNORMAL LOW (ref 4.22–5.81)
RDW: 12.9 % (ref 11.5–15.5)
WBC: 9.8 10*3/uL (ref 4.0–10.5)
nRBC: 0.3 % — ABNORMAL HIGH (ref 0.0–0.2)

## 2023-11-27 LAB — BASIC METABOLIC PANEL
Anion gap: 8 (ref 5–15)
BUN: 16 mg/dL (ref 6–20)
CO2: 23 mmol/L (ref 22–32)
Calcium: 8.2 mg/dL — ABNORMAL LOW (ref 8.9–10.3)
Chloride: 102 mmol/L (ref 98–111)
Creatinine, Ser: 0.42 mg/dL — ABNORMAL LOW (ref 0.61–1.24)
GFR, Estimated: >60 mL/min (ref >60)
Glucose, Bld: 119 mg/dL — ABNORMAL HIGH (ref 70–99)
Potassium: 4.1 mmol/L (ref 3.5–5.1)
Sodium: 133 mmol/L — ABNORMAL LOW (ref 135–145)

## 2023-11-27 LAB — GLUCOSE, CAPILLARY
Glucose-Capillary: 106 mg/dL — ABNORMAL HIGH (ref 70–99)
Glucose-Capillary: 110 mg/dL — ABNORMAL HIGH (ref 70–99)
Glucose-Capillary: 113 mg/dL — ABNORMAL HIGH (ref 70–99)
Glucose-Capillary: 123 mg/dL — ABNORMAL HIGH (ref 70–99)
Glucose-Capillary: 87 mg/dL (ref 70–99)
Glucose-Capillary: 90 mg/dL (ref 70–99)
Glucose-Capillary: 95 mg/dL (ref 70–99)

## 2023-11-27 LAB — PHOSPHORUS: Phosphorus: 2.3 mg/dL — ABNORMAL LOW (ref 2.5–4.6)

## 2023-11-27 LAB — MAGNESIUM: Magnesium: 1.6 mg/dL — ABNORMAL LOW (ref 1.7–2.4)

## 2023-11-27 MED ORDER — THIAMINE HCL 100 MG PO TABS
100.0000 mg | ORAL_TABLET | Freq: Every day | ORAL | Status: AC
  Administered 2023-11-27 – 2023-12-03 (×6): 100 mg
  Filled 2023-11-27 (×10): qty 1

## 2023-11-27 MED ORDER — VANCOMYCIN HCL 1250 MG/250ML IV SOLN
1250.0000 mg | Freq: Two times a day (BID) | INTRAVENOUS | Status: AC
  Administered 2023-11-28 – 2023-11-29 (×4): 1250 mg via INTRAVENOUS
  Filled 2023-11-27 (×4): qty 250

## 2023-11-27 MED ORDER — POTASSIUM & SODIUM PHOSPHATES 280-160-250 MG PO PACK
1.0000 | PACK | Freq: Three times a day (TID) | ORAL | Status: AC
  Administered 2023-11-27 (×2): 1
  Filled 2023-11-27 (×3): qty 1

## 2023-11-27 MED ORDER — VANCOMYCIN HCL 1500 MG/300ML IV SOLN
1500.0000 mg | Freq: Once | INTRAVENOUS | Status: AC
  Administered 2023-11-27: 1500 mg via INTRAVENOUS
  Filled 2023-11-27: qty 300

## 2023-11-27 MED ORDER — MAGNESIUM SULFATE 4 GM/100ML IV SOLN
4.0000 g | Freq: Once | INTRAVENOUS | Status: AC
  Administered 2023-11-27: 4 g via INTRAVENOUS
  Filled 2023-11-27: qty 100

## 2023-11-27 NOTE — Progress Notes (Signed)
 NAME:  LEIB ELAHI, MRN:  [id number], DOB:  08-Sep-1996, LOS: 8 ADMISSION DATE:  11/19/2023, CONSULTATION DATE:  11/20/23 REFERRING MD:  Dr. Lawence, CHIEF COMPLAINT:  AMS   Brief Pt Description / Synopsis:  28 y.o. male with PMHx significant for chronic TBI with left sided hemiparesis admitted with Acute Metabolic Encephalopathy and Acute Hypoxic Respiratory Failure in the setting of aspiration and Pseudomonas and MRSA Pneumonia requiring intubation and mechanical ventilation.   History of Present Illness:  28 yo M presenting to [facility] ED from outpatient rehab on 11/19/23 for evaluation of altered mental status.   History obtained per chart review and mother's telephone interview, patient unable to participate in interview due to respiratory distress and baseline TBI. 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 He was treated at [facility] for 5 months then discharged to rehab. Per his mother and legal guardian his baseline is: Non verbal but he will yell out sporadic nonsensical words with left sided paralysis. He is able to move his RUE in order to feed himself with finger foods and he has some involuntary movement with that arm, he will swat at you. Similar baseline function with RLE, he can kick and move, but often will lay it bent and to the side. He has enough strength to even try and get out of bed on the right side. She denies any issues with swallowing, but eats mostly soft food. If he is not watched closely with eating he will try to eat all the food at once. The facility staff reported he was at his normal baseline until lunchtime on 11/19/23. It was observed he was more somnolent and not interactive. Staff noted a cough and slightly increased work of breathing. Mom also confirmed noting a congested cough the last time she visited earlier in the week. Staff and mom denied nausea/ vomiting, but mom reports chronic loose stools.   EMS reported the patient febrile and  tachycardic on arrival.   ED course: Upon arrival patient tachycardic and lethargic. Sepsis protocol initiated with antibiotics and IVF resuscitation. Labs significant for mild hypokalemia, otherwise WNL. Initial imaging unremarkable but then patient became hypoxic with SpO2 85% on RA and a CTa was obtained, negative for PE but concerning for aspiration pneumonia. TRH consulted for admission. While patient was pending admission in ED he became increasingly hypoxic in the 60's, placed on a NRB without improvement. PCCM alerted the need to emergently intubate the patient. Medications given: Cefepime , Flagyl , Vancomycin , 2 L IVF bolus, IV contrast Initial Vitals: 97.9, 17, 130, 119/82 & 92 on RA Significant labs: (Labs/ Imaging personally reviewed) EKG : pending Chemistry: Na+: 138, K+: 3.4, BUN/Cr.: 13/ 0.65, Serum CO2/ AG: 26/8 Hematology: WBC: 4.3, Hgb: 14.3,  Lactic/ PCT: 1.1 > 1.4 / pending, COVID-19 & Influenza A/B: negative   ABG: pending post intubation   CXR 11/19/23: no active disease CT head wo contrast 11/19/23: No evidence of acute intracranial abnormality. Stable multifocal encephalomalacia, likely the sequela of prior trauma. Similar age-advanced cerebral atrophy CT angio chest PE 11/19/23:  No pulmonary embolus. Near complete filling of the right mainstem bronchus with filling extending into the bronchus intermedius, right upper, middle and lower lobe bronchi. Layering debris in the trachea. Findings are highly suspicious for aspiration. Minimal ill-defined patchy and nodular airspace disease in the dependent right lower lobe, likely postobstructive pneumonia.   PCCM consulted for assistance in management and monitoring due to acute hypoxic respiratory failure requiring urgent  intubation and mechanical ventilatory support secondary to suspected aspiration and pneumonia.  Please see Significant Hospital Events section below for full detailed hospital course.   Pertinent  Medical  History  Seizures TBI (09/2019)  Micro Data:  1/27: SARS-CoV-2/flu/RSV PCR>> negative 1/27: Blood culture x 2>> no growth 1/28: Tracheal aspirate>> Pseudomonas aeruginosa and Staphylococcus aureus 1/28: HIV screen>> nonreactive 1/29: BAL>> Pseudomonas aeruginosa &  MRSA  Antimicrobials:   Anti-infectives (From admission, onward)    Start     Dose/Rate Route Frequency Ordered Stop   11/23/23 1500  levofloxacin  (LEVAQUIN ) IVPB 750 mg        750 mg 100 mL/hr over 90 Minutes Intravenous Every 24 hours 11/23/23 1341 11/30/23 1459   11/21/23 2200  doxycycline  (VIBRAMYCIN ) 100 mg in sodium chloride  0.9 % 250 mL IVPB        100 mg 125 mL/hr over 120 Minutes Intravenous Every 12 hours 11/21/23 1425 11/28/23 2159   11/21/23 2000  metroNIDAZOLE  (FLAGYL ) IVPB 500 mg  Status:  Discontinued        500 mg 100 mL/hr over 60 Minutes Intravenous Every 12 hours 11/21/23 1425 11/24/23 1227   11/20/23 1200  Ampicillin -Sulbactam (UNASYN ) 3 g in sodium chloride  0.9 % 100 mL IVPB  Status:  Discontinued        3 g 200 mL/hr over 30 Minutes Intravenous Every 6 hours 11/20/23 1131 11/21/23 1423   11/20/23 0800  cefTRIAXone  (ROCEPHIN ) 2 g in sodium chloride  0.9 % 100 mL IVPB  Status:  Discontinued        2 g 200 mL/hr over 30 Minutes Intravenous Every 24 hours 11/19/23 2322 11/20/23 0229   11/20/23 0100  azithromycin  (ZITHROMAX ) 500 mg in sodium chloride  0.9 % 250 mL IVPB  Status:  Discontinued        500 mg 250 mL/hr over 60 Minutes Intravenous Every 24 hours 11/19/23 2322 11/21/23 1423   11/19/23 1545  ceFEPIme  (MAXIPIME ) 2 g in sodium chloride  0.9 % 100 mL IVPB        2 g 200 mL/hr over 30 Minutes Intravenous  Once 11/19/23 1541 11/19/23 1630   11/19/23 1545  metroNIDAZOLE  (FLAGYL ) IVPB 500 mg        500 mg 100 mL/hr over 60 Minutes Intravenous  Once 11/19/23 1541 11/19/23 1748   11/19/23 1545  vancomycin  (VANCOCIN ) IVPB 1000 mg/200 mL premix        1,000 mg 200 mL/hr over 60 Minutes Intravenous   Once 11/19/23 1541 11/19/23 1917       Significant Hospital Events: Including procedures, antibiotic start and stop dates in addition to other pertinent events   11/20/23: Admit to ICU due to acute hypoxic respiratory failure requiring urgent intubation and mechanical ventilatory support secondary to suspected aspiration and pneumonia 11/21/23- patient moving RUE, mother at bedside we reviewed medical plan. He remains on 13mcg/kg/hr levophed , today plan to rescusitate more aggresively and wean from levophed  and potentially extubate post SBP.   He is febrile this am.  He is on zithromax , unasyn  11/22/23- s/p bronch yesterday with aspiration of mucus plugging.  Today resp status improved with liberation protocol in proces. SLP post extubation today. 11/23/23- patient is +for pseudomonas resp cultures.  He is also MRSA pcr +, refined therapy during rounds with pharmacist. He remains on MV 11/24/23- patient is still on vasopressor support weaning down on MV.  Secretions are slightly better.  11/25/23- patient weaned off levophed , failed SBT with tachypnea, tachycardia.  Secretions much improved. 11/26/23- on  minimal vent support, unable to perform SBT due to copious secretions from ETT.   11/27/23- on minimal vent support, secretions improved.  Change Doxycycline  to Vancomycin .  Plan for SBT as tolerated.  Interim History / Subjective:  As outlined above in significant hospital events section  Objective   Blood pressure 115/77, pulse 73, temperature 98.1 F (36.7 C), resp. rate 16, height 5' 9 (1.753 m), weight 63 kg, SpO2 100%.    Vent Mode: PRVC FiO2 (%):  [30 %] 30 % Set Rate:  [16 bmp] 16 bmp Vt Set:  [550 mL] 550 mL PEEP:  [5 cmH20] 5 cmH20 Plateau Pressure:  [16 cmH20-17 cmH20] 17 cmH20   Intake/Output Summary (Last 24 hours) at 11/27/2023 9192 Last data filed at 11/27/2023 9347 Gross per 24 hour  Intake 2156.83 ml  Output 1325 ml  Net 831.83 ml   Filed Weights   11/23/23 0415 11/26/23 0500  11/27/23 0500  Weight: 70 kg 62.8 kg 63 kg    Examination: General: Acute on chronically ill-appearing frail male, laying in bed, intubated and sedated, no acute distress HENT: Atraumatic, normocephalic, neck supple, no JVD, orally intubated Lungs: Coarse breath sounds throughout, even, nonlabored, normal effort, synchronous with the vent Cardiovascular: Regular rate and rhythm, S1-S2, no murmurs, rubs, gallops Abdomen: Soft, nontender, nondistended, no guarding rebound tenderness, bowel sounds positive x 4 Extremities: Left upper and lower extremities contractured, bilateral foot drop Neuro: Left-sided hemiparesis with left arm contractured (baseline), opens eyes to voice and tracks GU: External male catheter in place  Resolved Hospital Problem list     Assessment & Plan:   #Acute Hypoxic Respiratory Failure secondary to suspected aspiration and post obstructive pneumonia in the setting of TBI -Full vent support, implement lung protective strategies -Plateau pressures less than 30 cm H20 -Wean FiO2 & PEEP as tolerated to maintain O2 sats >92% -Follow intermittent Chest X-ray & ABG as needed -Spontaneous Breathing Trials when respiratory parameters met and mental status permits -Implement VAP Bundle -Prn Bronchodilators -ABX as above  #Pseudomonas & MRSA Pneumonia -Monitor fever curve -Trend WBC's & Procalcitonin -Follow cultures as above -Continue empiric Levaquin , change Doxycycline  to Vancomycin  pending cultures & sensitivities    #Sedation needs in setting of mechanical ventilation #Chronic Seizure Disorder #Chronic TBI Recent valproic  acid level was elevated. -Maintain a RASS goal of 0 to -1 -Fentanyl  and Precedex  as needed to maintain RASS goal -Avoid sedating medications as able -Daily wake up assessment - continue outpatient regimen: valproic  acid & lacosamide  for now - seizure precautions - baclofen  PRN continued, hold gabapentin  for now    Best Practice  (right click and Reselect all SmartList Selections daily)   Diet/type: NPO, tube feeds DVT prophylaxis: LMWH GI prophylaxis: PPI Lines: right internal jugular central line, and is still needed Foley:  N/A Code Status:  full code Last date of multidisciplinary goals of care discussion [2/4]  2/4: Call made to pt's mother for update, no answer.  Left HIPAA compliant VM asking for callback at her convenience.   Labs   CBC: Recent Labs  Lab 11/23/23 0356 11/24/23 0427 11/25/23 0519 11/26/23 0517 11/27/23 0313  WBC 8.1 5.7 8.2 11.7* 9.8  HGB 13.3 11.3* 11.9* 11.8* 11.2*  HCT 39.4 33.3* 36.2* 34.9* 34.1*  MCV 92.1 91.0 95.3 93.6 95.0  PLT 301 260 381 512* 437*    Basic Metabolic Panel: Recent Labs  Lab 11/23/23 0356 11/24/23 0427 11/25/23 0519 11/26/23 0517 11/27/23 0313  NA 137 137 143 138 133*  K  4.0 3.1* 4.1 3.6 4.1  CL 102 105 111 103 102  CO2 22 22 23 25 23   GLUCOSE 136* 133* 108* 146* 119*  BUN 6 7 10 13 16   CREATININE 0.54* 0.59* 0.47* 0.45* 0.42*  CALCIUM 8.5* 8.0* 8.4* 8.1* 8.2*  MG 2.1 1.9 2.3 1.9 1.6*  PHOS 3.0 1.8* 1.8* 2.7 2.3*   GFR: Estimated Creatinine Clearance: 122.5 mL/min (A) (by C-G formula based on SCr of 0.42 mg/dL (L)). Recent Labs  Lab 11/20/23 1000 11/21/23 0425 11/22/23 0402 11/24/23 0427 11/25/23 0519 11/26/23 0517 11/27/23 0313  PROCALCITON  --  0.17  --   --   --   --   --   WBC  --  7.5   < > 5.7 8.2 11.7* 9.8  LATICACIDVEN 1.4  --   --   --   --   --   --    < > = values in this interval not displayed.    Liver Function Tests: No results for input(s): AST, ALT, ALKPHOS, BILITOT, PROT, ALBUMIN  in the last 168 hours.  No results for input(s): LIPASE, AMYLASE in the last 168 hours. No results for input(s): AMMONIA in the last 168 hours.  ABG    Component Value Date/Time   PHART 7.41 11/22/2023 1753   PCO2ART 37 11/22/2023 1753   PO2ART 177 (H) 11/22/2023 1753   HCO3 23.5 11/22/2023 1753   TCO2 28  05/31/2021 0404   ACIDBASEDEF 0.8 11/22/2023 1753   O2SAT 99.8 11/22/2023 1753     Coagulation Profile: No results for input(s): INR, PROTIME in the last 168 hours.   Cardiac Enzymes: No results for input(s): CKTOTAL, CKMB, CKMBINDEX, TROPONINI in the last 168 hours.  HbA1C: Hgb A1c MFr Bld  Date/Time Value Ref Range Status  04/11/2022 06:10 AM 5.1 4.8 - 5.6 % Final    Comment:    (NOTE) Pre diabetes:          5.7%-6.4%  Diabetes:              >6.4%  Glycemic control for   <7.0% adults with diabetes   05/31/2021 04:18 PM 4.9 4.8 - 5.6 % Final    Comment:    (NOTE) Pre diabetes:          5.7%-6.4%  Diabetes:              >6.4%  Glycemic control for   <7.0% adults with diabetes     CBG: Recent Labs  Lab 11/26/23 1627 11/26/23 1925 11/27/23 0000 11/27/23 0320 11/27/23 0725  GLUCAP 99 98 123* 95 106*    Review of Systems:   Unable to assess due to intubation/sedation   Past Medical History:  He,  has a past medical history of 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   Surgical History:   Past Surgical History:  Procedure Laterality Date   GASTROSTOMY TUBE PLACEMENT       Social History:   reports that he has never smoked. He has never used smokeless tobacco. He reports that he does not currently use alcohol. He reports that he does not use drugs.   Family History:  His family history is not on file.   Allergies No Known Allergies   Home Medications  Prior to Admission medications   Medication Sig Start Date End Date Taking? Authorizing Provider  baclofen  (LIORESAL ) 10 MG tablet Take 10 mg by mouth 3 (three) times daily.  Yes [provider]  diazePAM , 15 MG Dose, (VALTOCO  15 MG DOSE) 2 x 7.5 MG/0.1ML LQPK Spray 7.5 mg into each nostril in the event of a seizure 04/20/22  Yes Perri DELENA Meliton Mickey., MD  gabapentin  (NEURONTIN ) 400 MG  capsule Take 1 capsule (400 mg total) by mouth 3 (three) times daily. 04/20/22 11/20/23 Yes Perri DELENA Meliton Mickey., MD  lacosamide  100 MG TABS Take 1 tablet (100 mg total) by mouth 2 (two) times daily. 04/20/22 11/20/23 Yes Perri DELENA Meliton Mickey., MD  melatonin 5 MG TABS Take 10 mg by mouth at bedtime.   Yes [provider]  QUEtiapine  (SEROQUEL  XR) 200 MG 24 hr tablet Take 200 mg by mouth in the morning.   Yes [provider]  QUEtiapine  (SEROQUEL ) 25 MG tablet Take 25 mg by mouth at bedtime.   Yes [provider]  valproic  acid (DEPAKENE ) 250 MG capsule Take 4 capsules (1,000 mg total) by mouth 2 (two) times daily. Patient taking differently: Take 500 mg by mouth every morning. 04/20/22 11/20/23 Yes Perri DELENA Meliton Mickey., MD  valproic  acid (DEPAKENE ) 250 MG capsule Take 750 mg by mouth every evening.   Yes [provider]     Critical care time: 40 minutes     Inge Lecher, AGACNP-BC [facility] Prefer epic messenger for cross cover needs If after hours, please call E-link
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
## 2023-11-27 NOTE — Plan of Care (Signed)
  Problem: Fluid Volume: Goal: Hemodynamic stability will improve Outcome: Progressing   Problem: Clinical Measurements: Goal: Diagnostic test results will improve Outcome: Progressing   Problem: Respiratory: Goal: Ability to maintain adequate ventilation will improve Outcome: Progressing   Problem: Respiratory: Goal: Ability to maintain a clear airway and adequate ventilation will improve Outcome: Progressing   Problem: Clinical Measurements: Goal: Diagnostic test results will improve Outcome: Progressing Goal: Respiratory complications will improve Outcome: Progressing Goal: Cardiovascular complication will be avoided Outcome: Progressing   Problem: Nutrition: Goal: Adequate nutrition will be maintained Outcome: Progressing

## 2023-11-28 ENCOUNTER — Inpatient Hospital Stay: Payer: Medicaid Other

## 2023-11-28 DIAGNOSIS — J690 Pneumonitis due to inhalation of food and vomit: Secondary | ICD-10-CM | POA: Diagnosis not present

## 2023-11-28 DIAGNOSIS — G9341 Metabolic encephalopathy: Secondary | ICD-10-CM | POA: Diagnosis not present

## 2023-11-28 DIAGNOSIS — J9601 Acute respiratory failure with hypoxia: Secondary | ICD-10-CM | POA: Diagnosis not present

## 2023-11-28 DIAGNOSIS — R1310 Dysphagia, unspecified: Secondary | ICD-10-CM

## 2023-11-28 LAB — BASIC METABOLIC PANEL
Anion gap: 7 (ref 5–15)
BUN: 7 mg/dL (ref 6–20)
CO2: 25 mmol/L (ref 22–32)
Calcium: 8.4 mg/dL — ABNORMAL LOW (ref 8.9–10.3)
Chloride: 103 mmol/L (ref 98–111)
Creatinine, Ser: 0.38 mg/dL — ABNORMAL LOW (ref 0.61–1.24)
GFR, Estimated: >60 mL/min (ref >60)
Glucose, Bld: 96 mg/dL (ref 70–99)
Potassium: 4 mmol/L (ref 3.5–5.1)
Sodium: 135 mmol/L (ref 135–145)

## 2023-11-28 LAB — CBC
HCT: 34.4 % — ABNORMAL LOW (ref 39.0–52.0)
Hemoglobin: 11.4 g/dL — ABNORMAL LOW (ref 13.0–17.0)
MCH: 30.8 pg (ref 26.0–34.0)
MCHC: 33.1 g/dL (ref 30.0–36.0)
MCV: 93 fL (ref 80.0–100.0)
Platelets: 431 10*3/uL — ABNORMAL HIGH (ref 150–400)
RBC: 3.7 MIL/uL — ABNORMAL LOW (ref 4.22–5.81)
RDW: 12.8 % (ref 11.5–15.5)
WBC: 8.7 10*3/uL (ref 4.0–10.5)
nRBC: 0 % (ref 0.0–0.2)

## 2023-11-28 LAB — GLUCOSE, CAPILLARY
Glucose-Capillary: 112 mg/dL — ABNORMAL HIGH (ref 70–99)
Glucose-Capillary: 119 mg/dL — ABNORMAL HIGH (ref 70–99)
Glucose-Capillary: 79 mg/dL (ref 70–99)
Glucose-Capillary: 94 mg/dL (ref 70–99)
Glucose-Capillary: 95 mg/dL (ref 70–99)
Glucose-Capillary: 98 mg/dL (ref 70–99)

## 2023-11-28 LAB — MAGNESIUM: Magnesium: 2.3 mg/dL (ref 1.7–2.4)

## 2023-11-28 LAB — PHOSPHORUS: Phosphorus: 2.8 mg/dL (ref 2.5–4.6)

## 2023-11-28 MED ORDER — QUETIAPINE FUMARATE 25 MG PO TABS
50.0000 mg | ORAL_TABLET | Freq: Two times a day (BID) | ORAL | Status: DC
  Administered 2023-11-28 – 2023-12-10 (×24): 50 mg
  Filled 2023-11-28 (×24): qty 2

## 2023-11-28 MED ORDER — FREE WATER
30.0000 mL | Status: DC
  Administered 2023-11-28 – 2023-11-29 (×5): 30 mL

## 2023-11-28 MED ORDER — LEVETIRACETAM IN NACL 1000 MG/100ML IV SOLN
1000.0000 mg | Freq: Once | INTRAVENOUS | Status: AC
  Administered 2023-11-28: 1000 mg via INTRAVENOUS
  Filled 2023-11-28: qty 100

## 2023-11-28 MED ORDER — VITAL 1.5 CAL PO LIQD
1000.0000 mL | ORAL | Status: DC
  Administered 2023-11-28: 1000 mL

## 2023-11-28 MED ORDER — MORPHINE SULFATE (PF) 2 MG/ML IV SOLN
1.0000 mg | Freq: Once | INTRAVENOUS | Status: AC
  Administered 2023-11-28: 1 mg via INTRAVENOUS
  Filled 2023-11-28: qty 1

## 2023-11-28 MED ORDER — DIAZEPAM 5 MG/ML IJ SOLN
5.0000 mg | Freq: Once | INTRAMUSCULAR | Status: AC
Start: 2023-11-28 — End: 2023-11-28
  Administered 2023-11-28: 5 mg via INTRAVENOUS
  Filled 2023-11-28: qty 2

## 2023-11-28 MED ORDER — LACTATED RINGERS IV BOLUS
250.0000 mL | Freq: Once | INTRAVENOUS | Status: AC
Start: 2023-11-28 — End: 2023-11-28
  Administered 2023-11-28: 250 mL via INTRAVENOUS

## 2023-11-28 MED ORDER — LACTATED RINGERS IV BOLUS
250.0000 mL | Freq: Once | INTRAVENOUS | Status: AC
Start: 2023-11-29 — End: 2023-11-29
  Administered 2023-11-28: 250 mL via INTRAVENOUS
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
## 2023-11-28 NOTE — Progress Notes (Signed)
 NAME:  Alex [name], MRN:  [id number], DOB:  1996-03-30, LOS: 9 ADMISSION DATE:  11/19/2023, CONSULTATION DATE:  11/20/23 REFERRING MD:  Dr. Lawence, CHIEF COMPLAINT:  AMS   Brief Pt Description / Synopsis:  28 y.o. male with PMHx significant for chronic TBI with left sided hemiparesis admitted with Acute Metabolic Encephalopathy and Acute Hypoxic Respiratory Failure in the setting of aspiration and Pseudomonas and MRSA Pneumonia requiring intubation and mechanical ventilation.   History of Present Illness:  28 yo M presenting to [facility] ED from outpatient rehab on 11/19/23 for evaluation of altered mental status.   History obtained per chart review and mother's telephone interview, patient unable to participate in interview due to respiratory distress and baseline TBI. 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 He was treated at [facility] for 5 months then discharged to rehab. Per his mother and legal guardian his baseline is: Non verbal but he will yell out sporadic nonsensical words with left sided paralysis. He is able to move his RUE in order to feed himself with finger foods and he has some involuntary movement with that arm, he will swat at you. Similar baseline function with RLE, he can kick and move, but often will lay it bent and to the side. He has enough strength to even try and get out of bed on the right side. She denies any issues with swallowing, but eats mostly soft food. If he is not watched closely with eating he will try to eat all the food at once. The facility staff reported he was at his normal baseline until lunchtime on 11/19/23. It was observed he was more somnolent and not interactive. Staff noted a cough and slightly increased work of breathing. Mom also confirmed noting a congested cough the last time she visited earlier in the week. Staff and mom denied nausea/ vomiting, but mom reports chronic loose stools.   EMS reported the patient febrile and  tachycardic on arrival.   ED course: Upon arrival patient tachycardic and lethargic. Sepsis protocol initiated with antibiotics and IVF resuscitation. Labs significant for mild hypokalemia, otherwise WNL. Initial imaging unremarkable but then patient became hypoxic with SpO2 85% on RA and a CTa was obtained, negative for PE but concerning for aspiration pneumonia. TRH consulted for admission. While patient was pending admission in ED he became increasingly hypoxic in the 60's, placed on a NRB without improvement. PCCM alerted the need to emergently intubate the patient. Medications given: Cefepime , Flagyl , Vancomycin , 2 L IVF bolus, IV contrast Initial Vitals: 97.9, 17, 130, 119/82 & 92 on RA Significant labs: (Labs/ Imaging personally reviewed) EKG : pending Chemistry: Na+: 138, K+: 3.4, BUN/Cr.: 13/ 0.65, Serum CO2/ AG: 26/8 Hematology: WBC: 4.3, Hgb: 14.3,  Lactic/ PCT: 1.1 > 1.4 / pending, COVID-19 & Influenza A/B: negative   ABG: pending post intubation   CXR 11/19/23: no active disease CT head wo contrast 11/19/23: No evidence of acute intracranial abnormality. Stable multifocal encephalomalacia, likely the sequela of prior trauma. Similar age-advanced cerebral atrophy CT angio chest PE 11/19/23:  No pulmonary embolus. Near complete filling of the right mainstem bronchus with filling extending into the bronchus intermedius, right upper, middle and lower lobe bronchi. Layering debris in the trachea. Findings are highly suspicious for aspiration. Minimal ill-defined patchy and nodular airspace disease in the dependent right lower lobe, likely postobstructive pneumonia.   PCCM consulted for assistance in management and monitoring due to acute hypoxic respiratory failure requiring urgent  intubation and mechanical ventilatory support secondary to suspected aspiration and pneumonia.  Please see Significant Hospital Events section below for full detailed hospital course.   Pertinent  Medical  History  Seizures TBI (09/2019)  Micro Data:  1/27: SARS-CoV-2/flu/RSV PCR>> negative 1/27: Blood culture x 2>> no growth 1/28: Tracheal aspirate>>Pseudomonas aeruginosa and Staphylococcus aureus 1/28: HIV screen>> nonreactive 1/29: BAL>>Pseudomonas aeruginosa &  MRSA  Antimicrobials:   Anti-infectives (From admission, onward)    Start     Dose/Rate Route Frequency Ordered Stop   11/28/23 0100  vancomycin  (VANCOREADY) IVPB 1250 mg/250 mL       Placed in Followed by Linked Group   1,250 mg 166.7 mL/hr over 90 Minutes Intravenous Every 12 hours 11/27/23 1153     11/27/23 1300  vancomycin  (VANCOREADY) IVPB 1500 mg/300 mL       Placed in Followed by Linked Group   1,500 mg 150 mL/hr over 120 Minutes Intravenous  Once 11/27/23 1153 11/27/23 1516   11/23/23 1500  levofloxacin  (LEVAQUIN ) IVPB 750 mg        750 mg 100 mL/hr over 90 Minutes Intravenous Every 24 hours 11/23/23 1341 11/30/23 1459   11/21/23 2200  doxycycline  (VIBRAMYCIN ) 100 mg in sodium chloride  0.9 % 250 mL IVPB  Status:  Discontinued        100 mg 125 mL/hr over 120 Minutes Intravenous Every 12 hours 11/21/23 1425 11/27/23 1148   11/21/23 2000  metroNIDAZOLE  (FLAGYL ) IVPB 500 mg  Status:  Discontinued        500 mg 100 mL/hr over 60 Minutes Intravenous Every 12 hours 11/21/23 1425 11/24/23 1227   11/20/23 1200  Ampicillin -Sulbactam (UNASYN ) 3 g in sodium chloride  0.9 % 100 mL IVPB  Status:  Discontinued        3 g 200 mL/hr over 30 Minutes Intravenous Every 6 hours 11/20/23 1131 11/21/23 1423   11/20/23 0800  cefTRIAXone  (ROCEPHIN ) 2 g in sodium chloride  0.9 % 100 mL IVPB  Status:  Discontinued        2 g 200 mL/hr over 30 Minutes Intravenous Every 24 hours 11/19/23 2322 11/20/23 0229   11/20/23 0100  azithromycin  (ZITHROMAX ) 500 mg in sodium chloride  0.9 % 250 mL IVPB  Status:  Discontinued        500 mg 250 mL/hr over 60 Minutes Intravenous Every 24 hours 11/19/23 2322 11/21/23 1423   11/19/23 1545  ceFEPIme   (MAXIPIME ) 2 g in sodium chloride  0.9 % 100 mL IVPB        2 g 200 mL/hr over 30 Minutes Intravenous  Once 11/19/23 1541 11/19/23 1630   11/19/23 1545  metroNIDAZOLE  (FLAGYL ) IVPB 500 mg        500 mg 100 mL/hr over 60 Minutes Intravenous  Once 11/19/23 1541 11/19/23 1748   11/19/23 1545  vancomycin  (VANCOCIN ) IVPB 1000 mg/200 mL premix        1,000 mg 200 mL/hr over 60 Minutes Intravenous  Once 11/19/23 1541 11/19/23 1917      Significant Hospital Events: Including procedures, antibiotic start and stop dates in addition to other pertinent events   11/20/23: Admit to ICU due to acute hypoxic respiratory failure requiring urgent intubation and mechanical ventilatory support secondary to suspected aspiration and pneumonia 11/21/23- patient moving RUE, mother at bedside we reviewed medical plan. He remains on 13mcg/kg/hr levophed , today plan to rescusitate more aggresively and wean from levophed  and potentially extubate post SBP.   He is febrile this am.  He is on zithromax , unasyn   11/22/23- s/p bronch yesterday with aspiration of mucus plugging.  Today resp status improved with liberation protocol in proces. SLP post extubation today. 11/23/23- patient is +for pseudomonas resp cultures.  He is also MRSA pcr +, refined therapy during rounds with pharmacist. He remains on MV 11/24/23- patient is still on vasopressor support weaning down on MV.  Secretions are slightly better.  11/25/23- patient weaned off levophed , failed SBT with tachypnea, tachycardia.  Secretions much improved. 11/26/23- on minimal vent support, unable to perform SBT due to copious secretions from ETT.   11/27/23- on minimal vent support, secretions improved.  Change Doxycycline  to Vancomycin .  Plan for SBT as tolerated. 11/28/23-Pt successfully extubated 02/4, currently tolerating HHFNC @35L /35%.  Required low dose precedex  overnight to allow suctioning due to excessive secretions. Pt with suspected seizure activity developed tremors in the  right upper extremity and became minimally responsive.  Received 1g of iv keppra    Interim History / Subjective:  As outlined above in significant hospital events section  Objective   Blood pressure 103/61, pulse 83, temperature 98.5 F (36.9 C), temperature source Axillary, resp. rate (!) 27, height 5' 9 (1.753 m), weight 59.1 kg, SpO2 100%.    Vent Mode: PRVC FiO2 (%):  [21 %-50 %] 50 % Set Rate:  [16 bmp] 16 bmp Vt Set:  [550 mL] 550 mL PEEP:  [5 cmH20] 5 cmH20 Pressure Support:  [9 cmH20] 9 cmH20 Plateau Pressure:  [19 cmH20] 19 cmH20   Intake/Output Summary (Last 24 hours) at 11/28/2023 9266 Last data filed at 11/28/2023 9371 Gross per 24 hour  Intake 948.56 ml  Output 3300 ml  Net -2351.44 ml   Filed Weights   11/26/23 0500 11/27/23 0500 11/28/23 0431  Weight: 62.8 kg 63 kg 59.1 kg    Examination: General: Acute on chronically ill-appearing frail male, laying in bed, NAD on HHFNC  HENT: Atraumatic, normocephalic, neck supple, no JVD Lungs: Diminished and coarse throughout, even, non labored  Cardiovascular: Sinus tachycardia, s1s2, no m/r/g, 2+ radial/2+ distal pulses, trace generalized edema  Abdomen: +BS x4, soft, non distended  Extremities: Left upper and lower extremities contractured, bilateral foot drop Neuro: Left-sided hemiparesis with left arm contractured (baseline), opens eyes to voice and tracks GU: External male catheter in place draining yellow urine   Resolved Hospital Problem list   Mechanical intubation   Assessment & Plan:   #Acute hypoxic respiratory failure secondary to suspected aspiration and post obstructive pneumonia in the setting of TBI - Supplemental O2 for dyspnea and/or hypoxia  - Maintain O2 sats 92% or higher  - Prn bronchodilator therapy  - Aggressive pulmonary hygiene  #Pseudomonas & MRSA pneumonia - Trend WBC and monitor fever curve  - Follow cultures  -Continue empiric levaquin  and vancomycin    #Chronic Seizure  Disorder #Chronic TBI Recent valproic  acid level was elevated. - Continue outpatient vimpat  and valproic  acid  - Continue outpatient baclofen   - Will attempt to wean precedex  gtt off  - IV keppra  x1 dose  - Seizure precautions   #Dysphagia - Speech therapy consulted recommended ice chips only following oral care and MBSS study once respiratory status improves/O2 requirements decrease to <15L/min - NGT placed to start TF's   Best Practice (right click and Reselect all SmartList Selections daily)   Diet/type: NPO, tube feeds DVT prophylaxis: LMWH GI prophylaxis: PPI Lines: right internal jugular central line, and is still needed Foley:  N/A Code Status:  full code Last date of multidisciplinary goals of care discussion [11/28/23]  2/5: Updated pts mother Suzen Hammond's via telephone.  Informed her pt had suspected seizure activity this morning.  I also discussed current plan of care.  Ms. Kevon was appreciative to receive an update, and stated she is planning of coming to bedside this afternoon to visit Celia  Labs   CBC: Recent Labs  Lab 11/24/23 0427 11/25/23 0519 11/26/23 0517 11/27/23 0313 11/28/23 0348  WBC 5.7 8.2 11.7* 9.8 8.7  HGB 11.3* 11.9* 11.8* 11.2* 11.4*  HCT 33.3* 36.2* 34.9* 34.1* 34.4*  MCV 91.0 95.3 93.6 95.0 93.0  PLT 260 381 512* 437* 431*    Basic Metabolic Panel: Recent Labs  Lab 11/24/23 0427 11/25/23 0519 11/26/23 0517 11/27/23 0313 11/28/23 0348  NA 137 143 138 133* 135  K 3.1* 4.1 3.6 4.1 4.0  CL 105 111 103 102 103  CO2 22 23 25 23 25   GLUCOSE 133* 108* 146* 119* 96  BUN 7 10 13 16 7   CREATININE 0.59* 0.47* 0.45* 0.42* 0.38*  CALCIUM 8.0* 8.4* 8.1* 8.2* 8.4*  MG 1.9 2.3 1.9 1.6* 2.3  PHOS 1.8* 1.8* 2.7 2.3* 2.8   GFR: Estimated Creatinine Clearance: 114.9 mL/min (A) (by C-G formula based on SCr of 0.38 mg/dL (L)). Recent Labs  Lab 11/25/23 0519 11/26/23 0517 11/27/23 0313 11/28/23 0348  WBC 8.2 11.7* 9.8 8.7     Liver Function Tests: No results for input(s): AST, ALT, ALKPHOS, BILITOT, PROT, ALBUMIN  in the last 168 hours.  No results for input(s): LIPASE, AMYLASE in the last 168 hours. No results for input(s): AMMONIA in the last 168 hours.  ABG    Component Value Date/Time   PHART 7.41 11/22/2023 1753   PCO2ART 37 11/22/2023 1753   PO2ART 177 (H) 11/22/2023 1753   HCO3 23.5 11/22/2023 1753   TCO2 28 05/31/2021 0404   ACIDBASEDEF 0.8 11/22/2023 1753   O2SAT 99.8 11/22/2023 1753     Coagulation Profile: No results for input(s): INR, PROTIME in the last 168 hours.   Cardiac Enzymes: No results for input(s): CKTOTAL, CKMB, CKMBINDEX, TROPONINI in the last 168 hours.  HbA1C: Hgb A1c MFr Bld  Date/Time Value Ref Range Status  04/11/2022 06:10 AM 5.1 4.8 - 5.6 % Final    Comment:    (NOTE) Pre diabetes:          5.7%-6.4%  Diabetes:              >6.4%  Glycemic control for   <7.0% adults with diabetes   05/31/2021 04:18 PM 4.9 4.8 - 5.6 % Final    Comment:    (NOTE) Pre diabetes:          5.7%-6.4%  Diabetes:              >6.4%  Glycemic control for   <7.0% adults with diabetes     CBG: Recent Labs  Lab 11/27/23 1243 11/27/23 1619 11/27/23 1940 11/27/23 2328 11/28/23 0355  GLUCAP 113* 110* 90 87 79    Review of Systems:   Unable to assess pt nonverbal   Past Medical History:  He,  has a past medical history of 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   Surgical History:   Past Surgical History:  Procedure Laterality Date   GASTROSTOMY TUBE PLACEMENT       Social History:   reports that he has never smoked. He has never used smokeless tobacco. He reports that he does not  currently use alcohol. He reports that he does not use drugs.   Family History:  His family history is not on file.   Allergies No Known Allergies   Home  Medications  Prior to Admission medications   Medication Sig Start Date End Date Taking? Authorizing Provider  baclofen  (LIORESAL ) 10 MG tablet Take 10 mg by mouth 3 (three) times daily.   Yes [provider]  diazePAM , 15 MG Dose, (VALTOCO  15 MG DOSE) 2 x 7.5 MG/0.1ML LQPK Spray 7.5 mg into each nostril in the event of a seizure 04/20/22  Yes Perri DELENA Meliton Mickey., MD  gabapentin  (NEURONTIN ) 400 MG capsule Take 1 capsule (400 mg total) by mouth 3 (three) times daily. 04/20/22 11/20/23 Yes Perri DELENA Meliton Mickey., MD  lacosamide  100 MG TABS Take 1 tablet (100 mg total) by mouth 2 (two) times daily. 04/20/22 11/20/23 Yes Perri DELENA Meliton Mickey., MD  melatonin 5 MG TABS Take 10 mg by mouth at bedtime.   Yes [provider]  QUEtiapine  (SEROQUEL  XR) 200 MG 24 hr tablet Take 200 mg by mouth in the morning.   Yes [provider]  QUEtiapine  (SEROQUEL ) 25 MG tablet Take 25 mg by mouth at bedtime.   Yes [provider]  valproic  acid (DEPAKENE ) 250 MG capsule Take 4 capsules (1,000 mg total) by mouth 2 (two) times daily. Patient taking differently: Take 500 mg by mouth every morning. 04/20/22 11/20/23 Yes Perri DELENA Meliton Mickey., MD  valproic  acid (DEPAKENE ) 250 MG capsule Take 750 mg by mouth every evening.   Yes [provider]     Critical care time: 37 minutes     Lonell Moose, AGNP  Pulmonary/Critical Care Pager [phone] (please enter 7 digits) PCCM Consult Pager [phone] (please enter 7 digits)
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
## 2023-11-28 NOTE — Plan of Care (Signed)
  Problem: Fluid Volume: Goal: Hemodynamic stability will improve Outcome: Progressing   Problem: Clinical Measurements: Goal: Diagnostic test results will improve Outcome: Progressing   Problem: Respiratory: Goal: Ability to maintain adequate ventilation will improve Outcome: Progressing   Problem: Clinical Measurements: Goal: Diagnostic test results will improve Outcome: Progressing Goal: Respiratory complications will improve Outcome: Progressing Goal: Cardiovascular complication will be avoided Outcome: Progressing

## 2023-11-29 ENCOUNTER — Inpatient Hospital Stay: Payer: Medicaid Other

## 2023-11-29 DIAGNOSIS — G40909 Epilepsy, unspecified, not intractable, without status epilepticus: Secondary | ICD-10-CM | POA: Diagnosis not present

## 2023-11-29 LAB — BASIC METABOLIC PANEL
Anion gap: 8 (ref 5–15)
BUN: <5 mg/dL — ABNORMAL LOW (ref 6–20)
CO2: 25 mmol/L (ref 22–32)
Calcium: 8.5 mg/dL — ABNORMAL LOW (ref 8.9–10.3)
Chloride: 107 mmol/L (ref 98–111)
Creatinine, Ser: 0.4 mg/dL — ABNORMAL LOW (ref 0.61–1.24)
GFR, Estimated: >60 mL/min (ref >60)
Glucose, Bld: 122 mg/dL — ABNORMAL HIGH (ref 70–99)
Potassium: 3.4 mmol/L — ABNORMAL LOW (ref 3.5–5.1)
Sodium: 140 mmol/L (ref 135–145)

## 2023-11-29 LAB — BLOOD GAS, VENOUS
Acid-Base Excess: 3.8 mmol/L — ABNORMAL HIGH (ref 0.0–2.0)
Bicarbonate: 25.7 mmol/L (ref 20.0–28.0)
O2 Saturation: 93.7 %
Patient temperature: 37
pCO2, Ven: 30 mmHg — ABNORMAL LOW (ref 44–60)
pH, Ven: 7.54 — ABNORMAL HIGH (ref 7.25–7.43)
pO2, Ven: 59 mmHg — ABNORMAL HIGH (ref 32–45)

## 2023-11-29 LAB — MAGNESIUM
Magnesium: 2.2 mg/dL (ref 1.7–2.4)
Magnesium: 2.1 mg/dL (ref 1.7–2.4)

## 2023-11-29 LAB — CBC
HCT: 37.3 % — ABNORMAL LOW (ref 39.0–52.0)
Hemoglobin: 12.4 g/dL — ABNORMAL LOW (ref 13.0–17.0)
MCH: 31 pg (ref 26.0–34.0)
MCHC: 33.2 g/dL (ref 30.0–36.0)
MCV: 93.3 fL (ref 80.0–100.0)
Platelets: 460 10*3/uL — ABNORMAL HIGH (ref 150–400)
RBC: 4 MIL/uL — ABNORMAL LOW (ref 4.22–5.81)
RDW: 13.2 % (ref 11.5–15.5)
WBC: 6.8 10*3/uL (ref 4.0–10.5)
nRBC: 0 % (ref 0.0–0.2)

## 2023-11-29 LAB — GLUCOSE, CAPILLARY
Glucose-Capillary: 107 mg/dL — ABNORMAL HIGH (ref 70–99)
Glucose-Capillary: 118 mg/dL — ABNORMAL HIGH (ref 70–99)
Glucose-Capillary: 106 mg/dL — ABNORMAL HIGH (ref 70–99)
Glucose-Capillary: 151 mg/dL — ABNORMAL HIGH (ref 70–99)
Glucose-Capillary: 143 mg/dL — ABNORMAL HIGH (ref 70–99)
Glucose-Capillary: 143 mg/dL — ABNORMAL HIGH (ref 70–99)
Glucose-Capillary: 129 mg/dL — ABNORMAL HIGH (ref 70–99)
Glucose-Capillary: 132 mg/dL — ABNORMAL HIGH (ref 70–99)

## 2023-11-29 LAB — TSH: TSH: 2.67 u[IU]/mL (ref 0.350–4.500)

## 2023-11-29 LAB — T4, FREE: Free T4: 1.56 ng/dL — ABNORMAL HIGH (ref 0.61–1.12)

## 2023-11-29 LAB — POTASSIUM: Potassium: 3.7 mmol/L (ref 3.5–5.1)

## 2023-11-29 LAB — PHOSPHORUS
Phosphorus: 3.5 mg/dL (ref 2.5–4.6)
Phosphorus: 3.5 mg/dL (ref 2.5–4.6)

## 2023-11-29 MED ORDER — FREE WATER
100.0000 mL | Status: DC
  Administered 2023-11-29 – 2023-12-11 (×60): 100 mL

## 2023-11-29 MED ORDER — LACTATED RINGERS IV BOLUS
250.0000 mL | Freq: Once | INTRAVENOUS | Status: AC
Start: 2023-11-29 — End: 2023-11-29
  Administered 2023-11-29: 250 mL via INTRAVENOUS

## 2023-11-29 MED ORDER — METOPROLOL TARTRATE 5 MG/5ML IV SOLN
2.5000 mg | Freq: Once | INTRAVENOUS | Status: AC
  Administered 2023-11-29: 2.5 mg via INTRAVENOUS
  Filled 2023-11-29: qty 5

## 2023-11-29 MED ORDER — JUVEN PO PACK
1.0000 | PACK | Freq: Two times a day (BID) | ORAL | Status: DC
  Administered 2023-11-30 – 2024-05-14 (×322): 1

## 2023-11-29 MED ORDER — PROSOURCE TF20 ENFIT COMPATIBL EN LIQD
60.0000 mL | Freq: Every day | ENTERAL | Status: DC
  Administered 2023-11-30 – 2024-05-23 (×175): 60 mL
  Filled 2023-11-29 (×176): qty 60

## 2023-11-29 MED ORDER — LACTATED RINGERS IV SOLN: INTRAVENOUS | Status: AC

## 2023-11-29 MED ORDER — IBUPROFEN 400 MG PO TABS
600.0000 mg | ORAL_TABLET | Freq: Once | ORAL | Status: AC
  Administered 2023-11-29: 600 mg
  Filled 2023-11-29: qty 2

## 2023-11-29 MED ORDER — MORPHINE SULFATE (PF) 2 MG/ML IV SOLN: 1.0000 mg | Freq: Once | INTRAVENOUS | Status: DC

## 2023-11-29 MED ORDER — DIAZEPAM 5 MG/ML IJ SOLN
2.5000 mg | Freq: Once | INTRAMUSCULAR | Status: AC
  Administered 2023-11-29: 2.5 mg via INTRAVENOUS
  Filled 2023-11-29: qty 2

## 2023-11-29 MED ORDER — METOPROLOL TARTRATE 25 MG PO TABS
25.0000 mg | ORAL_TABLET | Freq: Two times a day (BID) | ORAL | Status: DC
  Administered 2023-11-29 – 2023-11-30 (×3): 25 mg
  Filled 2023-11-29 (×3): qty 1

## 2023-11-29 MED ORDER — FENTANYL CITRATE PF 50 MCG/ML IJ SOSY
25.0000 ug | PREFILLED_SYRINGE | Freq: Once | INTRAMUSCULAR | Status: AC
  Administered 2023-11-29: 25 ug via INTRAVENOUS
  Filled 2023-11-29: qty 1

## 2023-11-29 MED ORDER — OSMOLITE 1.5 CAL PO LIQD
1000.0000 mL | ORAL | Status: DC
  Administered 2023-11-29 – 2023-12-01 (×3): 1000 mL

## 2023-11-29 MED ORDER — METOPROLOL TARTRATE 5 MG/5ML IV SOLN
2.5000 mg | INTRAVENOUS | Status: AC | PRN
Start: 2023-11-29 — End: 2023-11-29
  Administered 2023-11-29 (×4): 2.5 mg via INTRAVENOUS
  Filled 2023-11-29 (×4): qty 5

## 2023-11-29 MED ORDER — LACTATED RINGERS IV BOLUS
250.0000 mL | Freq: Once | INTRAVENOUS | Status: AC
  Administered 2023-11-29: 250 mL via INTRAVENOUS

## 2023-11-29 MED ORDER — POTASSIUM CHLORIDE 20 MEQ PO PACK
40.0000 meq | PACK | Freq: Once | ORAL | Status: AC
Start: 2023-11-29 — End: 2023-11-29
  Administered 2023-11-29: 40 meq
  Filled 2023-11-29: qty 2
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
## 2023-11-29 NOTE — Plan of Care (Signed)
  Problem: Clinical Measurements: Goal: Diagnostic test results will improve Outcome: Progressing   Problem: Respiratory: Goal: Ability to maintain adequate ventilation will improve Outcome: Progressing   Problem: Respiratory: Goal: Ability to maintain a clear airway and adequate ventilation will improve Outcome: Progressing   Problem: Clinical Measurements: Goal: Cardiovascular complication will be avoided Outcome: Progressing   Problem: Pain Managment: Goal: General experience of comfort will improve and/or be controlled Outcome: Progressing
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
## 2023-11-29 NOTE — Progress Notes (Signed)
 Attempted to update pts mother Suzen Spurr via telephone regarding pts condition and current plan of care.  However, Ms. Hammonds did not answer the telephone.  I left HIPAA  appropriate voicemail instructing her to return my phone call.   Lonell Moose, AGNP  Pulmonary/Critical Care Pager [phone] (please enter 7 digits) PCCM Consult Pager [phone] (please enter 7 digits)

## 2023-11-29 NOTE — Progress Notes (Signed)
 NAME:  Alex [name], MRN:  [id number], DOB:  Jul 08, 1996, LOS: 10 ADMISSION DATE:  11/19/2023, CONSULTATION DATE:  11/20/23 REFERRING MD:  Dr. Lawence, CHIEF COMPLAINT:  AMS   Brief Pt Description / Synopsis:  28 y.o. male with PMHx significant for chronic TBI with left sided hemiparesis admitted with Acute Metabolic Encephalopathy and Acute Hypoxic Respiratory Failure in the setting of aspiration and Pseudomonas and MRSA Pneumonia requiring intubation and mechanical ventilation.   History of Present Illness:  28 y.o. M presenting to [facility] ED from outpatient rehab on 11/19/23 for evaluation of altered mental status.   History obtained per chart review and mother's telephone interview, patient unable to participate in interview due to respiratory distress and baseline TBI. 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 He was treated at [facility] for 5 months then discharged to rehab. Per his mother and legal guardian his baseline is: Non verbal but he will yell out sporadic nonsensical words with left sided paralysis. He is able to move his RUE in order to feed himself with finger foods and he has some involuntary movement with that arm, he will swat at you. Similar baseline function with RLE, he can kick and move, but often will lay it bent and to the side. He has enough strength to even try and get out of bed on the right side. She denies any issues with swallowing, but eats mostly soft food. If he is not watched closely with eating he will try to eat all the food at once. The facility staff reported he was at his normal baseline until lunchtime on 11/19/23. It was observed he was more somnolent and not interactive. Staff noted a cough and slightly increased work of breathing. Mom also confirmed noting a congested cough the last time she visited earlier in the week. Staff and mom denied nausea/ vomiting, but mom reports chronic loose stools.   EMS reported the patient febrile and  tachycardic on arrival.   ED course: Upon arrival patient tachycardic and lethargic. Sepsis protocol initiated with antibiotics and IVF resuscitation. Labs significant for mild hypokalemia, otherwise WNL. Initial imaging unremarkable but then patient became hypoxic with SpO2 85% on RA and a CTa was obtained, negative for PE but concerning for aspiration pneumonia. TRH consulted for admission. While patient was pending admission in ED he became increasingly hypoxic in the 60's, placed on a NRB without improvement. PCCM alerted the need to emergently intubate the patient. Medications given: Cefepime , Flagyl , Vancomycin , 2 L IVF bolus, IV contrast Initial Vitals: 97.9, 17, 130, 119/82 & 92 on RA Significant labs: (Labs/ Imaging personally reviewed) EKG : pending Chemistry: Na+: 138, K+: 3.4, BUN/Cr.: 13/ 0.65, Serum CO2/ AG: 26/8 Hematology: WBC: 4.3, Hgb: 14.3,  Lactic/ PCT: 1.1 > 1.4 / pending, COVID-19 & Influenza A/B: negative   ABG: pending post intubation   CXR 11/19/23: no active disease CT head wo contrast 11/19/23: No evidence of acute intracranial abnormality. Stable multifocal encephalomalacia, likely the sequela of prior trauma. Similar 29-advanced cerebral atrophy CT angio chest PE 11/19/23:  No pulmonary embolus. Near complete filling of the right mainstem bronchus with filling extending into the bronchus intermedius, right upper, middle and lower lobe bronchi. Layering debris in the trachea. Findings are highly suspicious for aspiration. Minimal ill-defined patchy and nodular airspace disease in the dependent right lower lobe, likely postobstructive pneumonia.   PCCM consulted for assistance in management and monitoring due to acute hypoxic respiratory failure requiring urgent  intubation and mechanical ventilatory support secondary to suspected aspiration and pneumonia.  Please see Significant Hospital Events section below for full detailed hospital course.   Pertinent  Medical  History  Seizures TBI (09/2019)  Micro Data:  1/27: SARS-CoV-2/flu/RSV PCR>> negative 1/27: Blood culture x 2>> no growth 1/28: Tracheal aspirate>>Pseudomonas aeruginosa and Staphylococcus aureus 1/28: HIV screen>> nonreactive 1/29: BAL>>Pseudomonas aeruginosa & MRSA  Antimicrobials:   Anti-infectives (From admission, onward)    Start     Dose/Rate Route Frequency Ordered Stop   11/28/23 0100  vancomycin  (VANCOREADY) IVPB 1250 mg/250 mL       Placed in Followed by Linked Group   1,250 mg 166.7 mL/hr over 90 Minutes Intravenous Every 12 hours 11/27/23 1153 11/29/23 2359   11/27/23 1300  vancomycin  (VANCOREADY) IVPB 1500 mg/300 mL       Placed in Followed by Linked Group   1,500 mg 150 mL/hr over 120 Minutes Intravenous  Once 11/27/23 1153 11/27/23 1516   11/23/23 1500  levofloxacin  (LEVAQUIN ) IVPB 750 mg        750 mg 100 mL/hr over 90 Minutes Intravenous Every 24 hours 11/23/23 1341 11/30/23 1459   11/21/23 2200  doxycycline  (VIBRAMYCIN ) 100 mg in sodium chloride  0.9 % 250 mL IVPB  Status:  Discontinued        100 mg 125 mL/hr over 120 Minutes Intravenous Every 12 hours 11/21/23 1425 11/27/23 1148   11/21/23 2000  metroNIDAZOLE  (FLAGYL ) IVPB 500 mg  Status:  Discontinued        500 mg 100 mL/hr over 60 Minutes Intravenous Every 12 hours 11/21/23 1425 11/24/23 1227   11/20/23 1200  Ampicillin -Sulbactam (UNASYN ) 3 g in sodium chloride  0.9 % 100 mL IVPB  Status:  Discontinued        3 g 200 mL/hr over 30 Minutes Intravenous Every 6 hours 11/20/23 1131 11/21/23 1423   11/20/23 0800  cefTRIAXone  (ROCEPHIN ) 2 g in sodium chloride  0.9 % 100 mL IVPB  Status:  Discontinued        2 g 200 mL/hr over 30 Minutes Intravenous Every 24 hours 11/19/23 2322 11/20/23 0229   11/20/23 0100  azithromycin  (ZITHROMAX ) 500 mg in sodium chloride  0.9 % 250 mL IVPB  Status:  Discontinued        500 mg 250 mL/hr over 60 Minutes Intravenous Every 24 hours 11/19/23 2322 11/21/23 1423   11/19/23 1545   ceFEPIme  (MAXIPIME ) 2 g in sodium chloride  0.9 % 100 mL IVPB        2 g 200 mL/hr over 30 Minutes Intravenous  Once 11/19/23 1541 11/19/23 1630   11/19/23 1545  metroNIDAZOLE  (FLAGYL ) IVPB 500 mg        500 mg 100 mL/hr over 60 Minutes Intravenous  Once 11/19/23 1541 11/19/23 1748   11/19/23 1545  vancomycin  (VANCOCIN ) IVPB 1000 mg/200 mL premix        1,000 mg 200 mL/hr over 60 Minutes Intravenous  Once 11/19/23 1541 11/19/23 1917      Significant Hospital Events: Including procedures, antibiotic start and stop dates in addition to other pertinent events   11/20/23: Admit to ICU due to acute hypoxic respiratory failure requiring urgent intubation and mechanical ventilatory support secondary to suspected aspiration and pneumonia 11/21/23- patient moving RUE, mother at bedside we reviewed medical plan. He remains on 13mcg/kg/hr levophed , today plan to rescusitate more aggresively and wean from levophed  and potentially extubate post SBP.   He is febrile this am.  He is on zithromax , unasyn  11/22/23-  s/p bronch yesterday with aspiration of mucus plugging.  Today resp status improved with liberation protocol in proces. SLP post extubation today. 11/23/23- patient is +for pseudomonas resp cultures.  He is also MRSA pcr +, refined therapy during rounds with pharmacist. He remains on MV 11/24/23- patient is still on vasopressor support weaning down on MV.  Secretions are slightly better.  11/25/23- patient weaned off levophed , failed SBT with tachypnea, tachycardia.  Secretions much improved. 11/26/23- on minimal vent support, unable to perform SBT due to copious secretions from ETT.   11/27/23- on minimal vent support, secretions improved.  Change Doxycycline  to Vancomycin .  Plan for SBT as tolerated. 11/28/23-Pt successfully extubated 02/4, currently tolerating HHFNC @35L /35%.  Required low dose precedex  overnight to allow suctioning due to excessive secretions. Pt with suspected seizure activity developed  tremors in the right upper extremity and became minimally responsive.  Received 1g of iv keppra .    11/29/23-Overnight pt developed sinus tachycardia/svt 140 to 160's improved following 2.5 mg iv metoprolol .  Tolerating RA with no signs of respiratory distress.  Transferring to progressive care unit TRH to pick on 02/7  Interim History / Subjective:  As outlined above in significant hospital events section  Objective   Blood pressure (!) 135/91, pulse (!) 114, temperature 99.2 F (37.3 C), resp. rate (!) 25, height 5' 9 (1.753 m), weight 61.5 kg, SpO2 95%.        Intake/Output Summary (Last 24 hours) at 11/29/2023 0953 Last data filed at 11/29/2023 0545 Gross per 24 hour  Intake 2785.33 ml  Output 4100 ml  Net -1314.67 ml   Filed Weights   11/27/23 0500 11/28/23 0431 11/29/23 0500  Weight: 63 kg 59.1 kg 61.5 kg    Examination: General: Acute on chronically ill appearing frail male, laying in bed, NAD on HHFNC  HENT: Atraumatic, normocephalic, neck supple, no JVD Lungs: Diminished and coarse throughout, even, non labored  Cardiovascular: Sinus tachycardia, s1s2, no m/r/g, 2+ radial/2+ distal pulses, trace generalized edema  Abdomen: +BS x4, soft, non distended  Extremities: Left upper and lower extremities contractured, bilateral foot drop Neuro: Left-sided hemiparesis with left arm contractured (baseline), opens eyes to voice and tracks GU: External male catheter in place draining yellow urine   Resolved Hospital Problem list   Mechanical intubation   Assessment & Plan:   #Sinus tachycardia/SVT  - Continuous telemetry monitoring  - Prn metoprolol  for sustained hr greater than 130   #Acute hypoxic respiratory failure secondary to suspected aspiration and post obstructive pneumonia in the setting of TBI - Supplemental O2 for dyspnea and/or hypoxia  - Maintain O2 sats 92% or higher  - Prn bronchodilator therapy  - Aggressive pulmonary hygiene  #Pseudomonas & MRSA pneumonia -  Trend WBC and monitor fever curve  - Follow cultures  - Continue levaquin  and vancomycin    #Chronic Seizure Disorder #Chronic TBI Recent valproic  acid level was elevated. - Continue outpatient vimpat  and valproic  acid  - Continue outpatient baclofen   - Continue seroquel   - Seizure precautions   #Dysphagia - Speech therapy consulted recommended ice chips only following oral care and MBSS study once respiratory status improves/O2 requirements decrease to <15L/min - Continue NGT for TF's    Best Practice (right click and Reselect all SmartList Selections daily)   Diet/type: NPO, tube feeds DVT prophylaxis: LMWH GI prophylaxis: PPI Lines: right internal jugular central line, and is still needed Foley:  N/A Code Status:  full code Last date of multidisciplinary goals of care discussion [11/29/23]  2/6: Will updated pts mother Suzen Spurr via telephone   Labs   CBC: Recent Labs  Lab 11/25/23 0519 11/26/23 0517 11/27/23 0313 11/28/23 0348 11/29/23 0254  WBC 8.2 11.7* 9.8 8.7 6.8  HGB 11.9* 11.8* 11.2* 11.4* 12.4*  HCT 36.2* 34.9* 34.1* 34.4* 37.3*  MCV 95.3 93.6 95.0 93.0 93.3  PLT 381 512* 437* 431* 460*    Basic Metabolic Panel: Recent Labs  Lab 11/25/23 0519 11/26/23 0517 11/27/23 0313 11/28/23 0348 11/29/23 0254  NA 143 138 133* 135 140  K 4.1 3.6 4.1 4.0 3.4*  CL 111 103 102 103 107  CO2 23 25 23 25 25   GLUCOSE 108* 146* 119* 96 122*  BUN 10 13 16 7  <5*  CREATININE 0.47* 0.45* 0.42* 0.38* 0.40*  CALCIUM 8.4* 8.1* 8.2* 8.4* 8.5*  MG 2.3 1.9 1.6* 2.3 2.2  PHOS 1.8* 2.7 2.3* 2.8 3.5   GFR: Estimated Creatinine Clearance: 119.6 mL/min (A) (by C-G formula based on SCr of 0.4 mg/dL (L)). Recent Labs  Lab 11/26/23 0517 11/27/23 0313 11/28/23 0348 11/29/23 0254  WBC 11.7* 9.8 8.7 6.8    Liver Function Tests: No results for input(s): AST, ALT, ALKPHOS, BILITOT, PROT, ALBUMIN  in the last 168 hours.  No results for input(s): LIPASE,  AMYLASE in the last 168 hours. No results for input(s): AMMONIA in the last 168 hours.  ABG    Component Value Date/Time   PHART 7.41 11/22/2023 1753   PCO2ART 37 11/22/2023 1753   PO2ART 177 (H) 11/22/2023 1753   HCO3 25.7 11/29/2023 0254   TCO2 28 05/31/2021 0404   ACIDBASEDEF 0.8 11/22/2023 1753   O2SAT 93.7 11/29/2023 0254     Coagulation Profile: No results for input(s): INR, PROTIME in the last 168 hours.   Cardiac Enzymes: No results for input(s): CKTOTAL, CKMB, CKMBINDEX, TROPONINI in the last 168 hours.  HbA1C: Hgb A1c MFr Bld  Date/Time Value Ref Range Status  04/11/2022 06:10 AM 5.1 4.8 - 5.6 % Final    Comment:    (NOTE) Pre diabetes:          5.7%-6.4%  Diabetes:              >6.4%  Glycemic control for   <7.0% adults with diabetes   05/31/2021 04:18 PM 4.9 4.8 - 5.6 % Final    Comment:    (NOTE) Pre diabetes:          5.7%-6.4%  Diabetes:              >6.4%  Glycemic control for   <7.0% adults with diabetes     CBG: Recent Labs  Lab 11/28/23 1617 11/28/23 1954 11/28/23 2332 11/29/23 0411 11/29/23 0804  GLUCAP 119* 112* 98 107* 118*    Review of Systems:   Unable to assess pt nonverbal   Past Medical History:  He,  has a past medical history of 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   Surgical History:   Past Surgical History:  Procedure Laterality Date   GASTROSTOMY TUBE PLACEMENT       Social History:   reports that he has never smoked. He has never used smokeless tobacco. He reports that he does not currently use alcohol. He reports that he does not use drugs.   Family History:  His family history is not on file.   Allergies No Known Allergies   Home Medications  Prior to Admission medications  Medication Sig Start Date End Date Taking? Authorizing Provider  baclofen  (LIORESAL ) 10 MG tablet Take 10 mg by  mouth 3 (three) times daily.   Yes [provider]  diazePAM , 15 MG Dose, (VALTOCO  15 MG DOSE) 2 x 7.5 MG/0.1ML LQPK Spray 7.5 mg into each nostril in the event of a seizure 04/20/22  Yes Perri DELENA Meliton Mickey., MD  gabapentin  (NEURONTIN ) 400 MG capsule Take 1 capsule (400 mg total) by mouth 3 (three) times daily. 04/20/22 11/20/23 Yes Perri DELENA Meliton Mickey., MD  lacosamide  100 MG TABS Take 1 tablet (100 mg total) by mouth 2 (two) times daily. 04/20/22 11/20/23 Yes Perri DELENA Meliton Mickey., MD  melatonin 5 MG TABS Take 10 mg by mouth at bedtime.   Yes [provider]  QUEtiapine  (SEROQUEL  XR) 200 MG 24 hr tablet Take 200 mg by mouth in the morning.   Yes [provider]  QUEtiapine  (SEROQUEL ) 25 MG tablet Take 25 mg by mouth at bedtime.   Yes [provider]  valproic  acid (DEPAKENE ) 250 MG capsule Take 4 capsules (1,000 mg total) by mouth 2 (two) times daily. Patient taking differently: Take 500 mg by mouth every morning. 04/20/22 11/20/23 Yes Perri DELENA Meliton Mickey., MD  valproic  acid (DEPAKENE ) 250 MG capsule Take 750 mg by mouth every evening.   Yes [provider]     Critical care time: 37 minutes     Lonell Moose, AGNP  Pulmonary/Critical Care Pager [phone] (please enter 7 digits) PCCM Consult Pager [phone] (please enter 7 digits)

## 2023-11-29 NOTE — Progress Notes (Addendum)
 2155: Pt's HR sustaining 130-140's. Pt appears to be agitated. NP notified. New orders given. Seroquel  50 mg given. One time order order for Morphine  1mg  IV given.   2253: Pt's HR sustaining in 150's. NP notified and at the bedside. New order for Valium  5mg  given IV. 250mL LR bolus given.   9941: A total of 3 - 250mL LR bolus given. Pt HR sustaining less than 135. NP made aware. Continous IVF started @ 42mL/hr.   0204: Pt's HR back up to 135-137. New order for LR bolus placed and given.  0330: Pt febrile. Axillary temp 101. PRN Tylenol  given per order. Ice packs placed. Mom educated about keeping blankets off in attempt to cool pt.   0410: Pt's HR sustaining in the 150's again after being repositioned in bed. NP notified and at the bedside. Pt appears to be agitated, moving his head back and forth. New orders for Fentanyl  25 mcg IV given. Ibuprofen  600 mg given per order.   9462: One time order for Lopressor  2.5 mg IV given for sustained HR > 135.

## 2023-11-29 NOTE — TOC Progression Note (Signed)
 Transition of Care [facility]) - Progression Note    Patient Details  Name: Alex [name] MRN: [id number] Date of Birth: 08/21/1996  Transition of Care [facility]) CM/SW Contact  Lauraine JAYSON Carpen, LCSW Phone Number: 11/29/2023, 11:41 AM  Clinical Narrative:   CSW called and spoke to mom. She confirmed plan to return to [facility] at discharge.  Expected Discharge Plan: Skilled Nursing Facility Barriers to Discharge: Continued Medical Work up  Expected Discharge Plan and Services       Living arrangements for the past 2 months: Skilled Nursing Facility                                       Social Determinants of Health (SDOH) Interventions SDOH Screenings   Food Insecurity: Patient Unable To Answer (11/20/2023)  Housing: Patient Unable To Answer (11/20/2023)  Transportation Needs: Patient Unable To Answer (11/20/2023)  Utilities: Patient Unable To Answer (11/20/2023)  Financial Resource Strain: Low Risk  (12/02/2021)   Received from [facility]  Tobacco Use: Low Risk  (11/19/2023)    Readmission Risk Interventions     No data to display

## 2023-11-29 NOTE — [place] FL2 (Signed)
 [place]  MEDICAID FL2 LEVEL OF CARE FORM     IDENTIFICATION  Patient Name: Alex [name] Birthdate: Oct 08, 1996 Sex: male Admission Date (Current Location): 11/19/2023  [place] and [place] Number:  Chiropodist and Address:  [facility], [address], [place], [place] [address]      Provider Number: [id number]  Attending Physician Name and Address:  Isadora Hose, MD  Relative Name and Phone Number:       Current Level of Care: Hospital Recommended Level of Care: Skilled Nursing Facility Prior Approval Number:    Date Approved/Denied:   PASRR Number: [id number]  Discharge Plan: SNF    Current Diagnoses: Patient Active Problem List   Diagnosis Date Noted   Pressure injury of skin 11/26/2023   Acute hypoxic respiratory failure (HCC) 11/20/2023   Seizure disorder (HCC) 11/20/2023   Sepsis (HCC) 11/20/2023   Acute metabolic encephalopathy 11/20/2023   Hypokalemia 11/20/2023   Aspiration pneumonia (HCC) 11/19/2023   Fever 04/20/2022   Acute respiratory failure with hypoxia (HCC) 04/12/2022   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 Currently NPO.)  AMBULATORY STATUS COMMUNICATION OF NEEDS Skin     Verbally Skin abrasions, Bruising, Other (Comment), PU Stage and Appropriate Care (Erythema/redness.) PU Stage 1 Dressing:  (Left buttocks: Foam every 3 days. Right foot: Foam every 3 days.)                      Personal Care Assistance Level of Assistance              Functional Limitations Info             SPECIAL CARE FACTORS FREQUENCY                       Contractures Contractures Info: Present    Additional Factors Info  Isolation Precautions Code Status Info: Full code Allergies Info: NKDA     Isolation Precautions Info: Contact: MRSA     Current Medications (11/29/2023):  This is the current hospital active medication list Current Facility-Administered Medications  Medication Dose Route Frequency Provider Last Rate Last Admin   acetaminophen  (TYLENOL ) tablet 650 mg  650 mg Per Tube Q6H PRN Rust-Chester, Britton L, NP   650 mg at 11/29/23 0330   Or   acetaminophen  (TYLENOL ) suppository 650 mg  650 mg Rectal Q6H PRN Rust-Chester, Britton L, NP   650 mg at 11/21/23 0230   baclofen  (LIORESAL ) tablet 10 mg  10 mg Per Tube TID Rust-Chester, Jenita CROME, NP   10 mg at 11/29/23 1011   Chlorhexidine  Gluconate Cloth 2 % PADS 6 each  6 each Topical Nightly Aleskerov, Fuad, MD   6 each at 11/28/23 2107   docusate sodium  (COLACE) capsule 100 mg  100 mg Oral BID PRN Rust-Chester, Britton L, NP       enoxaparin  (LOVENOX ) injection 40 mg  40 mg Subcutaneous Q24H Mansy, Jan A, MD   40 mg at 11/29/23  0002   feeding supplement (VITAL 1.5 CAL) liquid 1,000 mL  1,000 mL Per Tube Continuous Nelson, Dana G, NP 45 mL/hr at 11/29/23 0457 Infusion Verify at 11/29/23 0457   free water  30 mL  30 mL Per Tube Q4H Nelson, Dana G, NP   30 mL at 11/29/23 9662   ipratropium-albuterol  (DUONEB) 0.5-2.5 (3) MG/3ML nebulizer solution 3 mL  3 mL Nebulization Q4H PRN Isadora Hose, MD       lacosamide  (VIMPAT ) tablet 100 mg  100 mg Per Tube BID Grubb, Rodney D, RPH   100 mg at 11/29/23 1013   lactated ringers  infusion   Intravenous Continuous Rust-Chester, Jenita CROME, NP 75 mL/hr at 11/29/23 0457 Infusion Verify at 11/29/23 0457   levofloxacin  (LEVAQUIN ) IVPB 750 mg  750 mg Intravenous Q24H Isadora Hose, MD   Stopped at 11/28/23 1648   lip balm (BLISTEX) ointment   Topical PRN Kathrene Almarie Bake, NP   Given at 11/24/23 1932   metoprolol  tartrate (LOPRESSOR ) injection 2.5 mg  2.5 mg Intravenous Q5 min PRN Rust-Chester, Britton L, NP   2.5 mg at 11/29/23 1057   ondansetron  (ZOFRAN ) tablet 4 mg  4 mg Per Tube Q6H PRN Rust-Chester, Jenita CROME, NP       Or   ondansetron  (ZOFRAN ) injection 4 mg  4 mg Intravenous Q6H PRN Rust-Chester, Jenita CROME, NP       Oral care mouth rinse  15 mL Mouth Rinse Q2H Aleskerov, Fuad, MD   15 mL at 11/29/23 1043   Oral care mouth rinse  15 mL Mouth Rinse PRN Aleskerov, Fuad, MD       pantoprazole  (PROTONIX ) injection 40 mg  40 mg Intravenous Q12H Rust-Chester, Britton L, NP   40 mg at 11/29/23 1015   polyethylene glycol (MIRALAX  / GLYCOLAX ) packet 17 g  17 g Oral Daily PRN Rust-Chester, Jenita CROME, NP       QUEtiapine  (SEROQUEL ) tablet 50 mg  50 mg Per Tube BID Rust-Chester, Jenita L, NP   50 mg at 11/29/23 1013   sodium chloride  flush (NS) 0.9 % injection 10-40 mL  10-40 mL Intracatheter Q12H Parris Manna, MD   10 mL at 11/29/23 1043   sodium chloride  flush (NS) 0.9 % injection 10-40 mL  10-40 mL Intracatheter PRN Aleskerov, Fuad, MD       thiamine  (VITAMIN B1) tablet 100 mg  100 mg Per Tube Daily Dgayli, Hose, MD   100 mg at 11/29/23 1014   valproic  acid (DEPAKENE ) 250 MG/5ML solution 500 mg  500 mg Per Tube Daily Nada Adriana BIRCH, RPH   500 mg at 11/29/23 1011   valproic  acid (DEPAKENE ) 250 MG/5ML solution 750 mg  750 mg Per Tube QHS Nada Adriana BIRCH, RPH   750 mg at 11/28/23 2107   vancomycin  (VANCOREADY) IVPB 1250 mg/250 mL  1,250 mg Intravenous Q12H Isadora Hose, MD   Stopped at 11/29/23 0134     Discharge Medications: Please see discharge summary for a list of discharge medications.  Relevant Imaging Results:  Relevant Lab Results:   Additional Information SS#: [id number]  Lauraine JAYSON Carpen, LCSW
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
## 2023-11-29 NOTE — Plan of Care (Signed)
  Problem: Education: Goal: Knowledge of General Education information will improve Description: Including pain rating scale, medication(s)/side effects and non-pharmacologic comfort measures Outcome: Progressing   Problem: Health Behavior/Discharge Planning: Goal: Ability to manage health-related needs will improve Outcome: Progressing   Problem: Clinical Measurements: Goal: Respiratory complications will improve Outcome: Progressing   Problem: Nutrition: Goal: Adequate nutrition will be maintained Outcome: Progressing

## 2023-11-29 NOTE — Progress Notes (Signed)
 Updated pts mother Vinetta Greening regarding pts condition.  She was appreciative to receive an update.  Janey Meek, AGNP  Pulmonary/Critical Care Pager [phone] (please enter 7 digits) PCCM Consult Pager [phone] (please enter 7 digits)

## 2023-11-29 NOTE — Progress Notes (Signed)
   11/29/23 1046  Assess: MEWS Score  Temp 99 F (37.2 C)  BP (!) 144/91  MAP (mmHg) 106  Pulse Rate (!) 152  Resp 20  Level of Consciousness Responds to Pain  SpO2 97 %  O2 Device Room Air  Patient Activity (if Appropriate) In bed  Assess: MEWS Score  MEWS Temp 0  MEWS Systolic 0  MEWS Pulse 3  MEWS RR 0  MEWS LOC 2  MEWS Score 5  MEWS Score Color Red  Assess: if the MEWS score is Yellow or Red  Were vital signs accurate and taken at a resting state? Yes  Does the patient meet 2 or more of the SIRS criteria? No  MEWS guidelines implemented  No, previously red, continue vital signs every 4 hours  Notify: Charge Nurse/RN  Name of Charge Nurse/RN Notified Jessica, RN  Assess: SIRS CRITERIA  SIRS Temperature  0  SIRS Respirations  0  SIRS Pulse 1  SIRS WBC 0  SIRS Score Sum  1    New admission/transfer from ICU.  MD and charge RN aware.

## 2023-11-30 ENCOUNTER — Inpatient Hospital Stay: Payer: Medicaid Other

## 2023-11-30 DIAGNOSIS — J690 Pneumonitis due to inhalation of food and vomit: Secondary | ICD-10-CM | POA: Diagnosis not present

## 2023-11-30 DIAGNOSIS — S069XAS Unspecified intracranial injury with loss of consciousness status unknown, sequela: Secondary | ICD-10-CM | POA: Diagnosis not present

## 2023-11-30 DIAGNOSIS — A419 Sepsis, unspecified organism: Secondary | ICD-10-CM | POA: Diagnosis not present

## 2023-11-30 DIAGNOSIS — J9601 Acute respiratory failure with hypoxia: Secondary | ICD-10-CM | POA: Diagnosis not present

## 2023-11-30 DIAGNOSIS — D75838 Other thrombocytosis: Secondary | ICD-10-CM

## 2023-11-30 DIAGNOSIS — G9341 Metabolic encephalopathy: Secondary | ICD-10-CM | POA: Diagnosis not present

## 2023-11-30 LAB — C DIFFICILE (CDIFF) QUICK SCRN (NO PCR REFLEX)
C Diff antigen: NEGATIVE
C Diff interpretation: NOT DETECTED
C Diff toxin: NEGATIVE

## 2023-11-30 LAB — GLUCOSE, CAPILLARY
Glucose-Capillary: 164 mg/dL — ABNORMAL HIGH (ref 70–99)
Glucose-Capillary: 121 mg/dL — ABNORMAL HIGH (ref 70–99)
Glucose-Capillary: 135 mg/dL — ABNORMAL HIGH (ref 70–99)
Glucose-Capillary: 103 mg/dL — ABNORMAL HIGH (ref 70–99)
Glucose-Capillary: 115 mg/dL — ABNORMAL HIGH (ref 70–99)

## 2023-11-30 LAB — CBC
HCT: 37 % — ABNORMAL LOW (ref 39.0–52.0)
Hemoglobin: 12.4 g/dL — ABNORMAL LOW (ref 13.0–17.0)
MCH: 30.9 pg (ref 26.0–34.0)
MCHC: 33.5 g/dL (ref 30.0–36.0)
MCV: 92.3 fL (ref 80.0–100.0)
Platelets: 577 10*3/uL — ABNORMAL HIGH (ref 150–400)
RBC: 4.01 MIL/uL — ABNORMAL LOW (ref 4.22–5.81)
RDW: 13.6 % (ref 11.5–15.5)
WBC: 15.8 10*3/uL — ABNORMAL HIGH (ref 4.0–10.5)
nRBC: 0 % (ref 0.0–0.2)

## 2023-11-30 LAB — BASIC METABOLIC PANEL
Anion gap: 9 (ref 5–15)
BUN: <5 mg/dL — ABNORMAL LOW (ref 6–20)
CO2: 26 mmol/L (ref 22–32)
Calcium: 8.6 mg/dL — ABNORMAL LOW (ref 8.9–10.3)
Chloride: 105 mmol/L (ref 98–111)
Creatinine, Ser: 0.51 mg/dL — ABNORMAL LOW (ref 0.61–1.24)
GFR, Estimated: >60 mL/min (ref >60)
Glucose, Bld: 113 mg/dL — ABNORMAL HIGH (ref 70–99)
Potassium: 3.9 mmol/L (ref 3.5–5.1)
Sodium: 140 mmol/L (ref 135–145)

## 2023-11-30 LAB — BLOOD GAS, ARTERIAL
Acid-Base Excess: 1.7 mmol/L (ref 0.0–2.0)
Bicarbonate: 24.2 mmol/L (ref 20.0–28.0)
O2 Content: 15 L/min
O2 Saturation: 97.6 %
Patient temperature: 37
pCO2 arterial: 31 mm[Hg] — ABNORMAL LOW (ref 32–48)
pH, Arterial: 7.5 — ABNORMAL HIGH (ref 7.35–7.45)
pO2, Arterial: 73 mm[Hg] — ABNORMAL LOW (ref 83–108)

## 2023-11-30 LAB — PHOSPHORUS: Phosphorus: 3.3 mg/dL (ref 2.5–4.6)

## 2023-11-30 LAB — PROCALCITONIN: Procalcitonin: <0.1 ng/mL

## 2023-11-30 LAB — MAGNESIUM: Magnesium: 2 mg/dL (ref 1.7–2.4)

## 2023-11-30 LAB — OSMOLALITY: Osmolality: 286 mosm/kg (ref 275–295)

## 2023-11-30 LAB — BRAIN NATRIURETIC PEPTIDE: B Natriuretic Peptide: 72 pg/mL (ref 0.0–100.0)

## 2023-11-30 LAB — OSMOLALITY, URINE: Osmolality, Ur: 172 mosm/kg — ABNORMAL LOW (ref 300–900)

## 2023-11-30 MED ORDER — LEVOFLOXACIN IN D5W 750 MG/150ML IV SOLN
750.0000 mg | INTRAVENOUS | Status: DC
  Administered 2023-11-30: 750 mg via INTRAVENOUS
  Filled 2023-11-30: qty 150

## 2023-11-30 MED ORDER — LACTATED RINGERS IV BOLUS
1000.0000 mL | Freq: Once | INTRAVENOUS | Status: AC
  Administered 2023-11-30: 1000 mL via INTRAVENOUS

## 2023-11-30 MED ORDER — IPRATROPIUM-ALBUTEROL 0.5-2.5 (3) MG/3ML IN SOLN
3.0000 mL | Freq: Four times a day (QID) | RESPIRATORY_TRACT | Status: DC
  Administered 2023-11-30 – 2023-12-09 (×33): 3 mL via RESPIRATORY_TRACT
  Filled 2023-11-30 (×35): qty 3

## 2023-11-30 MED ORDER — DIAZEPAM 5 MG/ML IJ SOLN
2.5000 mg | Freq: Once | INTRAMUSCULAR | Status: AC
  Administered 2023-11-30: 2.5 mg via INTRAVENOUS
  Filled 2023-11-30: qty 2

## 2023-11-30 MED ORDER — IBUPROFEN 400 MG PO TABS
600.0000 mg | ORAL_TABLET | Freq: Four times a day (QID) | ORAL | Status: DC | PRN
  Administered 2023-11-30: 600 mg
  Filled 2023-11-30: qty 2

## 2023-11-30 MED ORDER — LACTATED RINGERS IV BOLUS
500.0000 mL | Freq: Once | INTRAVENOUS | Status: AC
  Administered 2023-11-30: 500 mL via INTRAVENOUS

## 2023-11-30 MED ORDER — LACTATED RINGERS IV SOLN: INTRAVENOUS | Status: DC

## 2023-11-30 NOTE — Procedures (Signed)
 Patient presented to [facility]  for possible left paracentesis. Limited ultrasound examination of the left lung showed only trace pleural effusion. No procedure performed. Images saved in Epic.  Jetta Morrow, AGACNP-BC 11/30/2023, 3:04 PM
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
## 2023-11-30 NOTE — TOC Progression Note (Signed)
 Transition of Care [facility]) - Progression Note    Patient Details  Name: Alex [name] MRN: [id number] Date of Birth: 1995-12-28  [facility] CM/SW Contact  Tomasa JAYSON Childes, RN Phone Number: 11/30/2023, 11:38 AM  Clinical Narrative:    TOC continuing to follow patient's progress throughout discharge planning.   Expected Discharge Plan: Skilled Nursing Facility Barriers to Discharge: Continued Medical Work up  Expected Discharge Plan and Services       Living arrangements for the past 2 months: Skilled Nursing Facility                                       Social Determinants of Health (SDOH) Interventions SDOH Screenings   Food Insecurity: Patient Unable To Answer (11/20/2023)  Housing: Patient Unable To Answer (11/20/2023)  Transportation Needs: Patient Unable To Answer (11/20/2023)  Utilities: Patient Unable To Answer (11/20/2023)  Financial Resource Strain: Low Risk  (12/02/2021)   Received from [facility]  Tobacco Use: Low Risk  (11/19/2023)    Readmission Risk Interventions     No data to display

## 2023-11-30 NOTE — Progress Notes (Addendum)
 Acute Hypoxic Respiratory Failure secondary to HCAP +/- aspiration Continued fevers with desaturation episode linked to repositioning. LLL diminished lung sounds - Continue HFNC overnight, utilize HHFNC - Supplemental O2 to maintain SpO2 > 90% - STAT chest x-ray & ABG - Ensure adequate pulmonary hygiene  - Continue HCAP coverage: extended coverage for 10 days, consider adjusting PRN - ibuprofen  and tylenol  administered with improvement in tachycardia  Suspected DI - serum osmo & ur osmo ordered   Jenita Ruth Rust-Chester, AGACNP-BC Acute Care Nurse Practitioner [facility]   [phone] / [phone] Please see Amion for details.

## 2023-11-30 NOTE — Progress Notes (Addendum)
 Progress Note   Patient: Alex [name] FMW:[id number] DOB: September 16, 1996 DOA: 11/19/2023     11 DOS: the patient was seen and examined on 11/30/2023   Brief hospital course: 28 y.o. male with PMHx significant for chronic TBI with left sided hemiparesis admitted with Acute Metabolic Encephalopathy and Acute Hypoxic Respiratory Failure in the setting of aspiration and Pseudomonas and MRSA Pneumonia requiring intubation and mechanical ventilation.  Patient also had a septic shock requiring Levophed .  Bronchoscopy was performed on 1/29, sputum culture grow MRSA and Pseudomonas.  Patient was treated with vancomycin  and Levaquin . Patient was able to wean off vasopressor on 2/1, extubated on 2/4.   Patient continued to have daily high fever since transfer to the medical floor.  He has completed 4 days of vancomycin , continued on Levaquin .   Principal Problem:   Aspiration pneumonia (HCC) Active Problems:   Septic shock (HCC)   Acute metabolic encephalopathy   Hypokalemia   Seizure disorder (HCC)   Acute hypoxic respiratory failure (HCC)   Pressure injury of skin   Reactive thrombocytosis   Assessment and Plan: Aspiration pneumonia secondary to Pseudomonas and MRSA. Septic shock secondary to pneumonia. Acute respiratory failure with hypoxemia secondary to aspiration pneumonia. Acute metabolic encephalopathy secondary to infection. Patient has been hemodynamically stable without requiring vasopressors.  But still requiring high flow oxygen. Sputum culture was positive for MRSA and Pseudomonas, which are susceptible to vancomycin  and Levaquin .  Patient so far has completed 4 days of vancomycin , still on Levaquin .  However, patient condition does not seem to be improving significantly.  Still has significant short of breath and hypoxemia, still has daily fever. Reviewed chest x-ray images performed this morning, compared to the prior x-rays, patient seemed to have  new changes in left lung, with a  possible new pleural effusion.  Obtain CT scan of the chest without contrast, will attempt to perform thoracentesis to rule out empyema.  Another consideration is asymmetric pulmonary edema, will review CT scan, ordered BNP.  Discontinue IV fluids. Patient also has been having watery loose stools, will consider order C. difficile toxin per ID consult. Patient currently minimally responsive, but does not seem to have any active aspiration.  Patient condition still critical, will monitor very closely in progressive unit, transfer back to ICU if needed.  Traumatic brain injury. Seizure disorder. Home seizure medicine continued via NG tube.   1507. Ultrasound only showed trace effusion, HR increased to 140, BNP normal, patient is dry, will give 500ml LR bolus followed with infusion   Subjective:  Patient still has daily high fever, had a worsening oxygen requirement, still has some short of breath.  Patient is minimally responsive.  Physical Exam: Vitals:   11/30/23 0500 11/30/23 0615 11/30/23 0808 11/30/23 1208  BP:  (!) 122/92 110/73 115/70  Pulse:  (!) 140 (!) 128 (!) 125  Resp:  19 (!) 21 (!) 21  Temp:  99.2 F (37.3 C) 98.9 F (37.2 C) 98.2 F (36.8 C)  TempSrc:   Axillary Axillary  SpO2:  93% 94% 94%  Weight: 68.5 kg     Height:       General exam: Appears calm and comfortable  Respiratory system: Rhonchi in the base. Respiratory effort normal. Cardiovascular system: Regular and tachycardic, no JVD, murmurs, rubs, gallops or clicks. No pedal edema. Gastrointestinal system: Abdomen is nondistended, soft and nontender. No organomegaly or masses felt. Normal bowel sounds heard. Central nervous system: Drowsy and minimally responsive. Extremities: Symmetric 5 x 5 power.  Skin: No rashes, lesions or ulcers    Data Reviewed:  Reviewed chest x-ray results and images, compared to prior x-rays.  Reviewed all lab results, culture results.  Family Communication: Mother updated at  bedside.  Disposition: Status is: Inpatient Remains inpatient appropriate because: Severity of disease, IV treatment, altered mental status.     Time spent: 65 minutes  Author: Murvin Mana, MD 11/30/2023 12:50 PM  For on call review [email or web address].
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
## 2023-11-30 NOTE — Plan of Care (Signed)
  Problem: Fluid Volume: Goal: Hemodynamic stability will improve Outcome: Progressing   Problem: Clinical Measurements: Goal: Diagnostic test results will improve Outcome: Progressing Goal: Signs and symptoms of infection will decrease Outcome: Progressing   Problem: Respiratory: Goal: Ability to maintain adequate ventilation will improve Outcome: Progressing   Problem: Activity: Goal: Ability to tolerate increased activity will improve Outcome: Progressing   Problem: Respiratory: Goal: Ability to maintain a clear airway and adequate ventilation will improve Outcome: Progressing   Problem: Role Relationship: Goal: Method of communication will improve Outcome: Progressing   Problem: Education: Goal: Knowledge of General Education information will improve Description: Including pain rating scale, medication(s)/side effects and non-pharmacologic comfort measures Outcome: Progressing   Problem: Health Behavior/Discharge Planning: Goal: Ability to manage health-related needs will improve Outcome: Progressing   Problem: Clinical Measurements: Goal: Ability to maintain clinical measurements within normal limits will improve Outcome: Progressing Goal: Will remain free from infection Outcome: Progressing Goal: Diagnostic test results will improve Outcome: Progressing Goal: Respiratory complications will improve Outcome: Progressing Goal: Cardiovascular complication will be avoided Outcome: Progressing   Problem: Activity: Goal: Risk for activity intolerance will decrease Outcome: Progressing   Problem: Nutrition: Goal: Adequate nutrition will be maintained Outcome: Progressing   Problem: Coping: Goal: Level of anxiety will decrease Outcome: Progressing   Problem: Elimination: Goal: Will not experience complications related to bowel motility Outcome: Progressing Goal: Will not experience complications related to urinary retention Outcome: Progressing   Problem:  Pain Managment: Goal: General experience of comfort will improve and/or be controlled Outcome: Progressing   Problem: Safety: Goal: Ability to remain free from injury will improve Outcome: Progressing   Problem: Skin Integrity: Goal: Risk for impaired skin integrity will decrease Outcome: Progressing

## 2023-11-30 NOTE — Progress Notes (Signed)
 SLP Cancellation Note  Patient Details Name: Alex [name] MRN: [id number] DOB: 23-Apr-1996   Cancelled treatment:       Reason Eval/Treat Not Completed: Fatigue/lethargy limiting ability to participate;Medical issues which prohibited therapy  Pt not able to arouse. No ready for PO trials. Mother at bedside and in agreement.   Havyn Ramo 11/30/2023, 11:07 AM
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
## 2023-12-01 ENCOUNTER — Inpatient Hospital Stay: Payer: Medicaid Other

## 2023-12-01 DIAGNOSIS — J9601 Acute respiratory failure with hypoxia: Secondary | ICD-10-CM | POA: Diagnosis not present

## 2023-12-01 DIAGNOSIS — I952 Hypotension due to drugs: Secondary | ICD-10-CM

## 2023-12-01 DIAGNOSIS — A419 Sepsis, unspecified organism: Secondary | ICD-10-CM | POA: Diagnosis not present

## 2023-12-01 DIAGNOSIS — J690 Pneumonitis due to inhalation of food and vomit: Secondary | ICD-10-CM | POA: Diagnosis not present

## 2023-12-01 LAB — BLOOD GAS, ARTERIAL
Acid-base deficit: 0.5 mmol/L (ref 0.0–2.0)
Bicarbonate: 23.1 mmol/L (ref 20.0–28.0)
FIO2: 100 %
O2 Saturation: 93 %
PEEP: 10 cmH2O
Patient temperature: 37
RATE: 20 {breaths}/min
Spontaneous VT: 450 mL
pCO2 arterial: 34 mm[Hg] (ref 32–48)
pH, Arterial: 7.44 (ref 7.35–7.45)
pO2, Arterial: 63 mm[Hg] — ABNORMAL LOW (ref 83–108)

## 2023-12-01 LAB — CBC
HCT: 36.9 % — ABNORMAL LOW (ref 39.0–52.0)
Hemoglobin: 12.2 g/dL — ABNORMAL LOW (ref 13.0–17.0)
MCH: 31.4 pg (ref 26.0–34.0)
MCHC: 33.1 g/dL (ref 30.0–36.0)
MCV: 95.1 fL (ref 80.0–100.0)
Platelets: 452 10*3/uL — ABNORMAL HIGH (ref 150–400)
RBC: 3.88 MIL/uL — ABNORMAL LOW (ref 4.22–5.81)
RDW: 14.1 % (ref 11.5–15.5)
WBC: 13.2 10*3/uL — ABNORMAL HIGH (ref 4.0–10.5)
nRBC: 0 % (ref 0.0–0.2)

## 2023-12-01 LAB — GLUCOSE, CAPILLARY
Glucose-Capillary: 115 mg/dL — ABNORMAL HIGH (ref 70–99)
Glucose-Capillary: 119 mg/dL — ABNORMAL HIGH (ref 70–99)
Glucose-Capillary: 121 mg/dL — ABNORMAL HIGH (ref 70–99)
Glucose-Capillary: 126 mg/dL — ABNORMAL HIGH (ref 70–99)
Glucose-Capillary: 126 mg/dL — ABNORMAL HIGH (ref 70–99)
Glucose-Capillary: 167 mg/dL — ABNORMAL HIGH (ref 70–99)
Glucose-Capillary: 177 mg/dL — ABNORMAL HIGH (ref 70–99)
Glucose-Capillary: 95 mg/dL (ref 70–99)

## 2023-12-01 LAB — PROCALCITONIN: Procalcitonin: <0.1 ng/mL

## 2023-12-01 LAB — BASIC METABOLIC PANEL
Anion gap: 11 (ref 5–15)
BUN: 9 mg/dL (ref 6–20)
CO2: 25 mmol/L (ref 22–32)
Calcium: 8.8 mg/dL — ABNORMAL LOW (ref 8.9–10.3)
Chloride: 104 mmol/L (ref 98–111)
Creatinine, Ser: 0.54 mg/dL — ABNORMAL LOW (ref 0.61–1.24)
GFR, Estimated: >60 mL/min (ref >60)
Glucose, Bld: 127 mg/dL — ABNORMAL HIGH (ref 70–99)
Potassium: 3.5 mmol/L (ref 3.5–5.1)
Sodium: 140 mmol/L (ref 135–145)

## 2023-12-01 LAB — PHOSPHORUS: Phosphorus: 3.3 mg/dL (ref 2.5–4.6)

## 2023-12-01 LAB — TROPONIN I (HIGH SENSITIVITY)
Troponin I (High Sensitivity): 10 ng/L (ref <18)
Troponin I (High Sensitivity): 10 ng/L (ref <18)

## 2023-12-01 LAB — MAGNESIUM: Magnesium: 2 mg/dL (ref 1.7–2.4)

## 2023-12-01 LAB — D-DIMER, QUANTITATIVE: D-Dimer, Quant: 0.83 ug{FEU}/mL — ABNORMAL HIGH (ref 0.00–0.50)

## 2023-12-01 LAB — LACTIC ACID, PLASMA
Lactic Acid, Venous: 3.2 mmol/L (ref 0.5–1.9)
Lactic Acid, Venous: 2.2 mmol/L (ref 0.5–1.9)
Lactic Acid, Venous: 1.9 mmol/L (ref 0.5–1.9)

## 2023-12-01 LAB — T3, FREE: T3, Free: 3.4 pg/mL (ref 2.0–4.4)

## 2023-12-01 MED ORDER — PROPOFOL 1000 MG/100ML IV EMUL
INTRAVENOUS | Status: AC
  Administered 2023-12-01: 40 ug/kg/min via INTRAVENOUS
  Filled 2023-12-01: qty 100

## 2023-12-01 MED ORDER — FENTANYL CITRATE PF 50 MCG/ML IJ SOSY
PREFILLED_SYRINGE | INTRAMUSCULAR | Status: AC
  Administered 2023-12-01: 50 ug via INTRAVENOUS
  Filled 2023-12-01: qty 1

## 2023-12-01 MED ORDER — VECURONIUM BROMIDE 10 MG IV SOLR
10.0000 mg | Freq: Once | INTRAVENOUS | Status: AC
  Administered 2023-12-01: 10 mg via INTRAVENOUS

## 2023-12-01 MED ORDER — NOREPINEPHRINE 4 MG/250ML-% IV SOLN
0.0000 ug/min | INTRAVENOUS | Status: DC
  Administered 2023-12-01: 12 ug/min via INTRAVENOUS
  Administered 2023-12-01: 11 ug/min via INTRAVENOUS
  Administered 2023-12-02: 6 ug/min via INTRAVENOUS
  Administered 2023-12-02: 12 ug/min via INTRAVENOUS
  Administered 2023-12-03: 7 ug/min via INTRAVENOUS
  Administered 2023-12-03 (×2): 8 ug/min via INTRAVENOUS
  Administered 2023-12-04: 3 ug/min via INTRAVENOUS
  Administered 2023-12-04: 6 ug/min via INTRAVENOUS
  Administered 2023-12-05: 3 ug/min via INTRAVENOUS
  Administered 2023-12-07: 4 ug/min via INTRAVENOUS
  Administered 2023-12-08: 5 ug/min via INTRAVENOUS
  Filled 2023-12-01 (×12): qty 250

## 2023-12-01 MED ORDER — VANCOMYCIN HCL 750 MG/150ML IV SOLN: 750.0000 mg | Freq: Three times a day (TID) | INTRAVENOUS | Status: DC

## 2023-12-01 MED ORDER — MIDAZOLAM HCL 2 MG/2ML IJ SOLN
INTRAMUSCULAR | Status: AC
  Administered 2023-12-01: 4 mg
  Filled 2023-12-01: qty 4

## 2023-12-01 MED ORDER — ORAL CARE MOUTH RINSE
15.0000 mL | OROMUCOSAL | Status: DC
  Administered 2023-12-01 – 2023-12-07 (×70): 15 mL via OROMUCOSAL

## 2023-12-01 MED ORDER — FENTANYL CITRATE PF 50 MCG/ML IJ SOSY
50.0000 ug | PREFILLED_SYRINGE | INTRAMUSCULAR | Status: DC | PRN
  Administered 2023-12-01 (×2): 50 ug via INTRAVENOUS
  Filled 2023-12-01 (×2): qty 1

## 2023-12-01 MED ORDER — LINEZOLID 600 MG/300ML IV SOLN
600.0000 mg | Freq: Two times a day (BID) | INTRAVENOUS | Status: AC
  Administered 2023-12-01 – 2023-12-07 (×14): 600 mg via INTRAVENOUS
  Filled 2023-12-01 (×15): qty 300

## 2023-12-01 MED ORDER — HYDROMORPHONE HCL-NACL 50-0.9 MG/50ML-% IV SOLN
0.5000 mg/h | INTRAVENOUS | Status: DC
  Administered 2023-12-01: 1 mg/h via INTRAVENOUS
  Administered 2023-12-02: 1.5 mg/h via INTRAVENOUS
  Administered 2023-12-04 – 2023-12-07 (×3): 1 mg/h via INTRAVENOUS
  Filled 2023-12-01 (×5): qty 50

## 2023-12-01 MED ORDER — LACTATED RINGERS IV BOLUS
1000.0000 mL | Freq: Once | INTRAVENOUS | Status: AC
  Administered 2023-12-01: 1000 mL via INTRAVENOUS

## 2023-12-01 MED ORDER — PROPOFOL 1000 MG/100ML IV EMUL
5.0000 ug/kg/min | INTRAVENOUS | Status: DC
  Administered 2023-12-01 (×2): 80 ug/kg/min via INTRAVENOUS
  Administered 2023-12-01: 70 ug/kg/min via INTRAVENOUS
  Administered 2023-12-01: 80 ug/kg/min via INTRAVENOUS
  Administered 2023-12-02: 70 ug/kg/min via INTRAVENOUS
  Administered 2023-12-02: 35 ug/kg/min via INTRAVENOUS
  Administered 2023-12-02: 70 ug/kg/min via INTRAVENOUS
  Administered 2023-12-02: 15 ug/kg/min via INTRAVENOUS
  Administered 2023-12-03 – 2023-12-04 (×3): 25 ug/kg/min via INTRAVENOUS
  Administered 2023-12-05: 30 ug/kg/min via INTRAVENOUS
  Administered 2023-12-05 (×2): 25 ug/kg/min via INTRAVENOUS
  Administered 2023-12-06 (×3): 30 ug/kg/min via INTRAVENOUS
  Administered 2023-12-07: 5700 ug via INTRAVENOUS
  Administered 2023-12-07: 70 ug/kg/min via INTRAVENOUS
  Administered 2023-12-07 – 2023-12-08 (×3): 30 ug/kg/min via INTRAVENOUS
  Filled 2023-12-01 (×23): qty 100

## 2023-12-01 MED ORDER — NOREPINEPHRINE 4 MG/250ML-% IV SOLN
INTRAVENOUS | Status: AC
  Administered 2023-12-01: 6 ug/min via INTRAVENOUS
  Filled 2023-12-01: qty 250

## 2023-12-01 MED ORDER — ETOMIDATE 2 MG/ML IV SOLN
20.0000 mg | Freq: Once | INTRAVENOUS | Status: AC
  Administered 2023-12-01: 20 mg via INTRAVENOUS
  Filled 2023-12-01: qty 10

## 2023-12-01 MED ORDER — VALPROIC ACID 250 MG/5ML PO SOLN
500.0000 mg | Freq: Once | ORAL | Status: AC
  Administered 2023-12-01: 500 mg
  Filled 2023-12-01: qty 10

## 2023-12-01 MED ORDER — PIPERACILLIN-TAZOBACTAM 3.375 G IVPB
3.3750 g | Freq: Three times a day (TID) | INTRAVENOUS | Status: AC
  Administered 2023-12-01 – 2023-12-07 (×21): 3.375 g via INTRAVENOUS
  Filled 2023-12-01 (×20): qty 50

## 2023-12-01 MED ORDER — HYDROMORPHONE HCL 1 MG/ML IJ SOLN
1.0000 mg | Freq: Once | INTRAMUSCULAR | Status: AC
  Administered 2023-12-01: 1 mg via INTRAVENOUS
  Filled 2023-12-01: qty 1

## 2023-12-01 MED ORDER — ORAL CARE MOUTH RINSE: 15.0000 mL | OROMUCOSAL | Status: DC | PRN

## 2023-12-01 MED ORDER — SUCCINYLCHOLINE CHLORIDE 200 MG/10ML IV SOSY
1.5000 mg/kg | PREFILLED_SYRINGE | Freq: Once | INTRAVENOUS | Status: AC
  Administered 2023-12-01: 95.2 mg via INTRAVENOUS
  Filled 2023-12-01: qty 10

## 2023-12-01 MED ORDER — VANCOMYCIN HCL 1250 MG/250ML IV SOLN
1250.0000 mg | Freq: Once | INTRAVENOUS | Status: DC
  Filled 2023-12-01: qty 250

## 2023-12-01 MED ORDER — HYDROMORPHONE BOLUS VIA INFUSION
0.2500 mg | INTRAVENOUS | Status: DC | PRN
  Administered 2023-12-02 – 2023-12-07 (×3): 0.5 mg via INTRAVENOUS
  Administered 2023-12-07: 1 mg via INTRAVENOUS
  Administered 2023-12-07: 0.5 mg via INTRAVENOUS
  Administered 2023-12-07: 1 mg via INTRAVENOUS
  Filled 2023-12-01: qty 2

## 2023-12-01 NOTE — Progress Notes (Signed)
 Brief Progress Note:  Patient transferred to the ICU with significant hypoxia, requiring hi-flo nasal cannula. His oxygenation worsened and he had to be intubated, which was performed without difficulty. He was persistently hypoxic following intubation and we performed flexible bronchoscopy for therapeutic aspiration of secretions given findings of mucus plugging in the LUL. Evaluation of the airway notable for thick secretions in the LUL and LLL that were aspirated. CXR showed collapse of the LUL secondary to mucus plugging. Patient's oxygenation improved after intubation, bronchoscopy, and mechanical ventilation.  Belva November, MD [facility] 12/01/2023 11:11 AM

## 2023-12-01 NOTE — Procedures (Signed)
 Bronchoscopy Procedure Note  DANUEL FELICETTI  [id number]  1996/08/04  Date:12/01/23  Time:11:08 AM   Provider Performing:Lasandra Batley   Procedure(s):  Flexible Bronchoscopy [phone]) and Initial Therapeutic Aspiration of Tracheobronchial Tree [phone])  Indication(s) Respiratory failure  Consent Unable to obtain consent due to emergent nature of procedure.  Anesthesia Vecuronium , propofol    Time Out Verified patient identification, verified procedure, site/side was marked, verified correct patient position, special equipment/implants available, medications/allergies/relevant history reviewed, required imaging and test results available.   Sterile Technique Usual hand hygiene, masks, gowns, and gloves were used   Procedure Description Bronchoscope advanced through endotracheal tube and into airway.  Airways were examined down to subsegmental level with findings noted below.   Following diagnostic evaluation, Therapeutic aspiration performed in Left upper lobe and left lower lobe.  Findings: Thick and copious secretions noted in the LUL and the LLL that were therapeutically aspirated. Procedure performed emergently due to significant hypoxia.   Complications/Tolerance None; patient tolerated the procedure well. Chest X-ray is not needed post procedure.   EBL Minimal   Specimen(s) N/A  Belva November, MD  Pulmonary Critical Care 12/01/2023 11:09 AM

## 2023-12-01 NOTE — Progress Notes (Signed)
 Pharmacy Antibiotic Note  Alex [name] is a 28 y.o. male admitted on 11/19/2023 with aspiration pneumonia.  Pharmacy has been consulted for Zosyn  dosing.  Plan:  Zosyn  3.375g IV q8h (4 hour infusion).  Pharmacy will continue to follow and will adjust abx dosing whenever warranted.  Temp (24hrs), Avg:99.4 F (37.4 C), Min:98.2 F (36.8 C), Max:101.1 F (38.4 C)   Recent Labs  Lab 11/26/23 0517 11/27/23 0313 11/28/23 0348 11/29/23 0254 11/30/23 0600  WBC 11.7* 9.8 8.7 6.8 15.8*  CREATININE 0.45* 0.42* 0.38* 0.40* 0.51*    Estimated Creatinine Clearance: 133.2 mL/min (A) (by C-G formula based on SCr of 0.51 mg/dL (L)).    No Known Allergies  Antimicrobials this admission: 2/07 Levaquin  >> x 1 dose 2/08 Zosyn  >>   Microbiology results: 01/29 BCx: Pseudomonas Aeruginosa  Thank you for allowing pharmacy to be a part of this patient's care.  Rankin CANDIE Dills, PharmD, [facility] 12/01/2023 1:31 AM

## 2023-12-01 NOTE — Procedures (Signed)
 Intubation Procedure Note  Alex [name]  [id number]  1995/12/04  Date:12/01/23  Time:11:07 AM   Provider Performing:Lukis Bunt    Procedure: Intubation (31500)  Indication(s) Respiratory Failure  Consent Unable to obtain consent due to emergent nature of procedure.   Anesthesia Etomidate  and Succinylcholine    Time Out Verified patient identification, verified procedure, site/side was marked, verified correct patient position, special equipment/implants available, medications/allergies/relevant history reviewed, required imaging and test results available.   Sterile Technique Usual hand hygeine, masks, and gloves were used   Procedure Description Patient positioned in bed supine.  Sedation given as noted above.  Patient was intubated with endotracheal tube using Glidescope.  View was Grade 1 full glottis .  Number of attempts was 1.  Colorimetric CO2 detector was consistent with tracheal placement.   Complications/Tolerance None; patient tolerated the procedure well. Chest X-ray is ordered to verify placement.   EBL Minimal   Specimen(s) None  Alex November, MD [facility] 12/01/2023 11:07 AM

## 2023-12-01 NOTE — Plan of Care (Signed)
  Problem: Fluid Volume: Goal: Hemodynamic stability will improve Outcome: Not Progressing   Problem: Clinical Measurements: Goal: Signs and symptoms of infection will decrease Outcome: Not Progressing   Problem: Respiratory: Goal: Ability to maintain adequate ventilation will improve Outcome: Not Progressing   Problem: Activity: Goal: Ability to tolerate increased activity will improve Outcome: Not Progressing   Problem: Activity: Goal: Ability to tolerate increased activity will improve Outcome: Not Progressing
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
## 2023-12-01 NOTE — Progress Notes (Signed)
 NAME:  Alex [name], MRN:  [id number], DOB:  03-01-1996, LOS: 12 ADMISSION DATE:  11/19/2023, CONSULTATION DATE:  11/20/23 REFERRING MD:  Dr. Lawence, CHIEF COMPLAINT:  AMS   Brief Pt Description / Synopsis:  28 y.o. male with PMHx significant for chronic TBI with left sided hemiparesis admitted with Acute Metabolic Encephalopathy and Acute Hypoxic Respiratory Failure in the setting of aspiration and Pseudomonas and MRSA Pneumonia requiring intubation and mechanical ventilation.   History of Present Illness:  28 yo M presenting to [facility] ED from outpatient rehab on 11/19/23 for evaluation of altered mental status.   History obtained per chart review and mother's telephone interview, patient unable to participate in interview due to respiratory distress and baseline TBI. 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 He was treated at [facility] for 5 months then discharged to rehab. Per his mother and legal guardian his baseline is: Non verbal but he will yell out sporadic nonsensical words with left sided paralysis. He is able to move his RUE in order to feed himself with finger foods and he has some involuntary movement with that arm, he will swat at you. Similar baseline function with RLE, he can kick and move, but often will lay it bent and to the side. He has enough strength to even try and get out of bed on the right side. She denies any issues with swallowing, but eats mostly soft food. If he is not watched closely with eating he will try to eat all the food at once. The facility staff reported he was at his normal baseline until lunchtime on 11/19/23. It was observed he was more somnolent and not interactive. Staff noted a cough and slightly increased work of breathing. Mom also confirmed noting a congested cough the last time she visited earlier in the week. Staff and mom denied nausea/ vomiting, but mom reports chronic loose stools.   EMS reported the patient febrile and  tachycardic on arrival.   ED course: Upon arrival patient tachycardic and lethargic. Sepsis protocol initiated with antibiotics and IVF resuscitation. Labs significant for mild hypokalemia, otherwise WNL. Initial imaging unremarkable but then patient became hypoxic with SpO2 85% on RA and a CTa was obtained, negative for PE but concerning for aspiration pneumonia. TRH consulted for admission. While patient was pending admission in ED he became increasingly hypoxic in the 60's, placed on a NRB without improvement. PCCM alerted the need to emergently intubate the patient. Medications given: Cefepime , Flagyl , Vancomycin , 2 L IVF bolus, IV contrast Initial Vitals: 97.9, 17, 130, 119/82 & 92 on RA Significant labs: (Labs/ Imaging personally reviewed) EKG : pending Chemistry: Na+: 138, K+: 3.4, BUN/Cr.: 13/ 0.65, Serum CO2/ AG: 26/8 Hematology: WBC: 4.3, Hgb: 14.3,  Lactic/ PCT: 1.1 > 1.4 / pending, COVID-19 & Influenza A/B: negative   ABG: pending post intubation   CXR 11/19/23: no active disease CT head wo contrast 11/19/23: No evidence of acute intracranial abnormality. Stable multifocal encephalomalacia, likely the sequela of prior trauma. Similar age-advanced cerebral atrophy CT angio chest PE 11/19/23:  No pulmonary embolus. Near complete filling of the right mainstem bronchus with filling extending into the bronchus intermedius, right upper, middle and lower lobe bronchi. Layering debris in the trachea. Findings are highly suspicious for aspiration. Minimal ill-defined patchy and nodular airspace disease in the dependent right lower lobe, likely postobstructive pneumonia.   PCCM consulted for assistance in management and monitoring due to acute hypoxic respiratory failure requiring urgent  intubation and mechanical ventilatory support secondary to suspected aspiration and pneumonia.  Please see Significant Hospital Events section below for full detailed hospital course.   Pertinent  Medical  History  Seizures TBI (09/2019)  Micro Data:  1/27: SARS-CoV-2/flu/RSV PCR>> negative 1/27: Blood culture x 2>> no growth 1/28: MRSA PCR>>Positive  1/28: Tracheal aspirate>>Pseudomonas aeruginosa and Staphylococcus aureus 1/28: HIV screen>> nonreactive 1/29: BAL>>Pseudomonas aeruginosa & MRSA 2/07: Cdiff>>negative   Antimicrobials:   Anti-infectives (From admission, onward)    Start     Dose/Rate Route Frequency Ordered Stop   12/01/23 0230  piperacillin -tazobactam (ZOSYN ) IVPB 3.375 g        3.375 g 12.5 mL/hr over 240 Minutes Intravenous Every 8 hours 12/01/23 0131     11/30/23 1500  levofloxacin  (LEVAQUIN ) IVPB 750 mg  Status:  Discontinued        750 mg 100 mL/hr over 90 Minutes Intravenous Every 24 hours 11/30/23 0345 12/01/23 0048   11/28/23 0100  vancomycin  (VANCOREADY) IVPB 1250 mg/250 mL       Placed in Followed by Linked Group   1,250 mg 166.7 mL/hr over 90 Minutes Intravenous Every 12 hours 11/27/23 1153 11/29/23 1331   11/27/23 1300  vancomycin  (VANCOREADY) IVPB 1500 mg/300 mL       Placed in Followed by Linked Group   1,500 mg 150 mL/hr over 120 Minutes Intravenous  Once 11/27/23 1153 11/27/23 1516   11/23/23 1500  levofloxacin  (LEVAQUIN ) IVPB 750 mg        750 mg 100 mL/hr over 90 Minutes Intravenous Every 24 hours 11/23/23 1341 11/29/23 1551   11/21/23 2200  doxycycline  (VIBRAMYCIN ) 100 mg in sodium chloride  0.9 % 250 mL IVPB  Status:  Discontinued        100 mg 125 mL/hr over 120 Minutes Intravenous Every 12 hours 11/21/23 1425 11/27/23 1148   11/21/23 2000  metroNIDAZOLE  (FLAGYL ) IVPB 500 mg  Status:  Discontinued        500 mg 100 mL/hr over 60 Minutes Intravenous Every 12 hours 11/21/23 1425 11/24/23 1227   11/20/23 1200  Ampicillin -Sulbactam (UNASYN ) 3 g in sodium chloride  0.9 % 100 mL IVPB  Status:  Discontinued        3 g 200 mL/hr over 30 Minutes Intravenous Every 6 hours 11/20/23 1131 11/21/23 1423   11/20/23 0800  cefTRIAXone  (ROCEPHIN ) 2 g in  sodium chloride  0.9 % 100 mL IVPB  Status:  Discontinued        2 g 200 mL/hr over 30 Minutes Intravenous Every 24 hours 11/19/23 2322 11/20/23 0229   11/20/23 0100  azithromycin  (ZITHROMAX ) 500 mg in sodium chloride  0.9 % 250 mL IVPB  Status:  Discontinued        500 mg 250 mL/hr over 60 Minutes Intravenous Every 24 hours 11/19/23 2322 11/21/23 1423   11/19/23 1545  ceFEPIme  (MAXIPIME ) 2 g in sodium chloride  0.9 % 100 mL IVPB        2 g 200 mL/hr over 30 Minutes Intravenous  Once 11/19/23 1541 11/19/23 1630   11/19/23 1545  metroNIDAZOLE  (FLAGYL ) IVPB 500 mg        500 mg 100 mL/hr over 60 Minutes Intravenous  Once 11/19/23 1541 11/19/23 1748   11/19/23 1545  vancomycin  (VANCOCIN ) IVPB 1000 mg/200 mL premix        1,000 mg 200 mL/hr over 60 Minutes Intravenous  Once 11/19/23 1541 11/19/23 1917      Significant Hospital Events: Including procedures, antibiotic start and stop dates in  addition to other pertinent events   11/20/23: Admit to ICU due to acute hypoxic respiratory failure requiring urgent intubation and mechanical ventilatory support secondary to suspected aspiration and pneumonia 11/21/23- patient moving RUE, mother at bedside we reviewed medical plan. He remains on 23mcg/kg/hr levophed , today plan to rescusitate more aggresively and wean from levophed  and potentially extubate post SBP.   He is febrile this am.  He is on zithromax , unasyn  11/22/23- s/p bronch yesterday with aspiration of mucus plugging.  Today resp status improved with liberation protocol in proces. SLP post extubation today. 11/23/23- patient is +for pseudomonas resp cultures.  He is also MRSA pcr +, refined therapy during rounds with pharmacist. He remains on MV 11/24/23- patient is still on vasopressor support weaning down on MV.  Secretions are slightly better.  11/25/23- patient weaned off levophed , failed SBT with tachypnea, tachycardia.  Secretions much improved. 11/26/23- on minimal vent support, unable to perform  SBT due to copious secretions from ETT.   11/27/23- on minimal vent support, secretions improved.  Change Doxycycline  to Vancomycin .  Plan for SBT as tolerated. 11/28/23-Pt successfully extubated 02/4, currently tolerating HHFNC @35L /35%.  Required low dose precedex  overnight to allow suctioning due to excessive secretions. Pt with suspected seizure activity developed tremors in the right upper extremity and became minimally responsive.  Received 1g of iv keppra .    11/29/23-Overnight pt developed sinus tachycardia/svt 140 to 160's improved following 2.5 mg iv metoprolol .  Tolerating RA with no signs of respiratory distress.  Transferring to progressive care unit TRH to pick on 02/7 12/01/23: Pt transferred back to ICU with severe acute hypoxic respiratory failure initially placed on HHFNC but due to significant hypoxia O2 sats in the 60's he required reintubation and underwent emergent bronchoscopy which revealed thick secretions in the LUL and LLL resulting in mucous plugging. Therapeutic aspiration performed. CXR showed collapse of the LUL secondary to mucus plugging   Interim History / Subjective:  As outlined above under significant events   Objective   Blood pressure 119/75, pulse (!) 143, temperature 99.1 F (37.3 C), temperature source Axillary, resp. rate (!) 33, height 5' 9 (1.753 m), weight 63.4 kg, SpO2 95%.    FiO2 (%):  [75 %] 75 %   Intake/Output Summary (Last 24 hours) at 12/01/2023 1105 Last data filed at 12/01/2023 9094 Gross per 24 hour  Intake 758.22 ml  Output 3450 ml  Net -2691.78 ml   Filed Weights   11/30/23 0500 12/01/23 0500 12/01/23 0925  Weight: 68.5 kg 63 kg 63.4 kg    Examination: General: Acute on chronically-ill appearing frail male, NAD mechanically intubated   HENT: Atraumatic, normocephalic, neck supple, no JVD Lungs: Diffuse rhonchi throughout, even, non labored  Cardiovascular: Sinus tachycardia, s1s2, no m/r/g, 2+ radial/2+ distal pulses, trace generalized  edema  Abdomen: +BS x4, soft, non distended  Extremities: Left upper and lower extremities contractured, bilateral foot drop Neuro: Left-sided hemiparesis with left arm contractured (baseline), opens eyes to voice and tracks GU: External male catheter in place draining yellow urine   Resolved Hospital Problem list   Mechanical intubation   Assessment & Plan:   #Sinus tachycardia/SVT  #Sepsis  #Hypotension secondary to sedating medication and possible recurrent sepsis  - Continuous telemetry monitoring  - IV fluid resuscitation and prn levophed  gtt to maintain map 65 or higher  - Trend lactic acid until normalized   #Acute hypoxic respiratory failure secondary to aspiration, LUL/LLL mucous plugging, and collapse of the LUL due to mucous plugging  in the setting of TBI s/p emergent bronchoscopy 02/8 #Mechanical ventilation  - Full vent support for now: vent settings reviewed and established  - Continue lung protective strategies  - Maintain plateau pressures less than 30 cm H2O - Maintain O2 sats 92% or higher  - Prn bronchodilator therapy  - Aggressive pulmonary hygiene - Pt unable to protect airway will likely require tracheostomy   #Pseudomonas & MRSA pneumonia #Aspiration pneumonia  - Trend WBC and monitor fever curve  - Follow cultures  - ID consulted and recommended discontinuing levaquin ; and started zosyn  02/8 for aspiration/pseudomonas/anaerobe coverage  - Added linezolid  due to continued decline in respiratory status  per ID recommendations   #Chronic Seizure Disorder #Chronic TBI Recent valproic  acid level was elevated. - Continue outpatient vimpat  and valproic  acid  - Continue outpatient baclofen   - Continue seroquel   - Seizure precautions   #Dysphagia - Continue NGT for TF's    Best Practice (right click and Reselect all SmartList Selections daily)   Diet/type: NPO, tube feeds DVT prophylaxis: LMWH GI prophylaxis: PPI Lines: right internal jugular  central line, and is still needed Foley:  N/A Code Status:  full code Last date of multidisciplinary goals of care discussion [12/01/23]  2/8: Pts mother updated by ICU Intensivist Dr. Isadora via telephone and informed her due to decline in respiratory status pt will require mechanical intubation   Labs   CBC: Recent Labs  Lab 11/27/23 0313 11/28/23 0348 11/29/23 0254 11/30/23 0600 12/01/23 0555  WBC 9.8 8.7 6.8 15.8* 13.2*  HGB 11.2* 11.4* 12.4* 12.4* 12.2*  HCT 34.1* 34.4* 37.3* 37.0* 36.9*  MCV 95.0 93.0 93.3 92.3 95.1  PLT 437* 431* 460* 577* 452*    Basic Metabolic Panel: Recent Labs  Lab 11/27/23 0313 11/28/23 0348 11/29/23 0254 11/29/23 1709 11/30/23 0600 12/01/23 0555  NA 133* 135 140  --  140 140  K 4.1 4.0 3.4* 3.7 3.9 3.5  CL 102 103 107  --  105 104  CO2 23 25 25   --  26 25  GLUCOSE 119* 96 122*  --  113* 127*  BUN 16 7 <5*  --  <5* 9  CREATININE 0.42* 0.38* 0.40*  --  0.51* 0.54*  CALCIUM 8.2* 8.4* 8.5*  --  8.6* 8.8*  MG 1.6* 2.3 2.2 2.1 2.0 2.0  PHOS 2.3* 2.8 3.5 3.5 3.3 3.3   GFR: Estimated Creatinine Clearance: 123.3 mL/min (A) (by C-G formula based on SCr of 0.54 mg/dL (L)). Recent Labs  Lab 11/28/23 0348 11/29/23 0254 11/30/23 0600 12/01/23 0555  PROCALCITON  --   --  <0.10  --   WBC 8.7 6.8 15.8* 13.2*    Liver Function Tests: No results for input(s): AST, ALT, ALKPHOS, BILITOT, PROT, ALBUMIN  in the last 168 hours.  No results for input(s): LIPASE, AMYLASE in the last 168 hours. No results for input(s): AMMONIA in the last 168 hours.  ABG    Component Value Date/Time   PHART 7.5 (H) 11/30/2023 0341   PCO2ART 31 (L) 11/30/2023 0341   PO2ART 73 (L) 11/30/2023 0341   HCO3 24.2 11/30/2023 0341   TCO2 28 05/31/2021 0404   ACIDBASEDEF 0.8 11/22/2023 1753   O2SAT 97.6 11/30/2023 0341     Coagulation Profile: No results for input(s): INR, PROTIME in the last 168 hours.   Cardiac Enzymes: No results for  input(s): CKTOTAL, CKMB, CKMBINDEX, TROPONINI in the last 168 hours.  HbA1C: Hgb A1c MFr Bld  Date/Time Value Ref Range Status  04/11/2022 06:10 AM 5.1 4.8 - 5.6 % Final    Comment:    (NOTE) Pre diabetes:          5.7%-6.4%  Diabetes:              >6.4%  Glycemic control for   <7.0% adults with diabetes   05/31/2021 04:18 PM 4.9 4.8 - 5.6 % Final    Comment:    (NOTE) Pre diabetes:          5.7%-6.4%  Diabetes:              >6.4%  Glycemic control for   <7.0% adults with diabetes     CBG: Recent Labs  Lab 11/30/23 2037 12/01/23 0119 12/01/23 0415 12/01/23 0830 12/01/23 0925  GLUCAP 115* 95 115* 126* 119*    Review of Systems:   Unable to assess pt nonverbal   Past Medical History:  He,  has a past medical history of 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   Surgical History:   Past Surgical History:  Procedure Laterality Date   GASTROSTOMY TUBE PLACEMENT       Social History:   reports that he has never smoked. He has never used smokeless tobacco. He reports that he does not currently use alcohol. He reports that he does not use drugs.   Family History:  His family history is not on file.   Allergies No Known Allergies   Home Medications  Prior to Admission medications   Medication Sig Start Date End Date Taking? Authorizing Provider  baclofen  (LIORESAL ) 10 MG tablet Take 10 mg by mouth 3 (three) times daily.   Yes [provider]  diazePAM , 15 MG Dose, (VALTOCO  15 MG DOSE) 2 x 7.5 MG/0.1ML LQPK Spray 7.5 mg into each nostril in the event of a seizure 04/20/22  Yes Perri DELENA Meliton Mickey., MD  gabapentin  (NEURONTIN ) 400 MG capsule Take 1 capsule (400 mg total) by mouth 3 (three) times daily. 04/20/22 11/20/23 Yes Perri DELENA Meliton Mickey., MD  lacosamide  100 MG TABS Take 1 tablet (100 mg total) by mouth 2 (two) times daily. 04/20/22 11/20/23 Yes  Perri DELENA Meliton Mickey., MD  melatonin 5 MG TABS Take 10 mg by mouth at bedtime.   Yes [provider]  QUEtiapine  (SEROQUEL  XR) 200 MG 24 hr tablet Take 200 mg by mouth in the morning.   Yes [provider]  QUEtiapine  (SEROQUEL ) 25 MG tablet Take 25 mg by mouth at bedtime.   Yes [provider]  valproic  acid (DEPAKENE ) 250 MG capsule Take 4 capsules (1,000 mg total) by mouth 2 (two) times daily. Patient taking differently: Take 500 mg by mouth every morning. 04/20/22 11/20/23 Yes Perri DELENA Meliton Mickey., MD  valproic  acid (DEPAKENE ) 250 MG capsule Take 750 mg by mouth every evening.   Yes [provider]     Critical care time: 70 minutes     Lonell Moose, AGNP  Pulmonary/Critical Care Pager [phone] (please enter 7 digits) PCCM Consult Pager [phone] (please enter 7 digits)

## 2023-12-01 NOTE — Progress Notes (Signed)
   12/01/23 0930  Spiritual Encounters  Type of Visit Initial  Care provided to: Family  Referral source Chaplain assessment  Reason for visit Routine spiritual support  OnCall Visit No  Interventions  Spiritual Care Interventions Made Established relationship of care and support;Compassionate presence;Prayer (Mother in ICU waiting)  Intervention Outcomes  Outcomes Connection to spiritual care  Spiritual Care Plan  Spiritual Care Issues Still Outstanding Chaplain will continue to follow

## 2023-12-01 NOTE — Progress Notes (Signed)
 SLP Cancellation Note  Patient Details Name: Alex [name] MRN: [id number] DOB: 03-16-1996   Cancelled treatment:       Reason Eval/Treat Not Completed: Medical issues which prohibited therapy;Other (comment) (pt transferred back to the ICU and intubated) Per critical care MD note, pt with significant hypoxia requiring intubation and then subsequent bronchoscopy. Pt not appropriate for dysphagia intervention at this time. SLP will continue to follow.   Adren Dollins Clapp, MS, CCC-SLP Speech Language Pathologist Rehab Services; [facility] [phone] (ascom)  Karsten Vaughn J Clapp 12/01/2023, 11:50 AM

## 2023-12-01 NOTE — Progress Notes (Signed)
 Unable to start ceribell at this time- the machine messaged that it could not record EEG at this time and to call customer service.. I called customer service and was told to restart the format storage and that this would take approximately 30 min to resolve.  Will attempt to start EEG when this process is completed.

## 2023-12-01 NOTE — Progress Notes (Signed)
 ST elevation noted on inferior leads. ECG done. NP Nellie Banas made aware. Troponin I stat done.

## 2023-12-02 DIAGNOSIS — R569 Unspecified convulsions: Secondary | ICD-10-CM

## 2023-12-02 DIAGNOSIS — J690 Pneumonitis due to inhalation of food and vomit: Secondary | ICD-10-CM | POA: Diagnosis not present

## 2023-12-02 DIAGNOSIS — G40909 Epilepsy, unspecified, not intractable, without status epilepticus: Secondary | ICD-10-CM | POA: Diagnosis not present

## 2023-12-02 DIAGNOSIS — J9601 Acute respiratory failure with hypoxia: Secondary | ICD-10-CM | POA: Diagnosis not present

## 2023-12-02 LAB — CBC
HCT: 33.1 % — ABNORMAL LOW (ref 39.0–52.0)
Hemoglobin: 10.9 g/dL — ABNORMAL LOW (ref 13.0–17.0)
MCH: 31.6 pg (ref 26.0–34.0)
MCHC: 32.9 g/dL (ref 30.0–36.0)
MCV: 95.9 fL (ref 80.0–100.0)
Platelets: 536 10*3/uL — ABNORMAL HIGH (ref 150–400)
RBC: 3.45 MIL/uL — ABNORMAL LOW (ref 4.22–5.81)
RDW: 13.8 % (ref 11.5–15.5)
WBC: 16.2 10*3/uL — ABNORMAL HIGH (ref 4.0–10.5)
nRBC: 0 % (ref 0.0–0.2)

## 2023-12-02 LAB — RENAL FUNCTION PANEL
Albumin: 2.5 g/dL — ABNORMAL LOW (ref 3.5–5.0)
Anion gap: 14 (ref 5–15)
BUN: 16 mg/dL (ref 6–20)
CO2: 25 mmol/L (ref 22–32)
Calcium: 8 mg/dL — ABNORMAL LOW (ref 8.9–10.3)
Chloride: 100 mmol/L (ref 98–111)
Creatinine, Ser: 0.56 mg/dL — ABNORMAL LOW (ref 0.61–1.24)
GFR, Estimated: >60 mL/min (ref >60)
Glucose, Bld: 188 mg/dL — ABNORMAL HIGH (ref 70–99)
Phosphorus: 4.7 mg/dL — ABNORMAL HIGH (ref 2.5–4.6)
Potassium: 3.1 mmol/L — ABNORMAL LOW (ref 3.5–5.1)
Sodium: 139 mmol/L (ref 135–145)

## 2023-12-02 LAB — GLUCOSE, CAPILLARY
Glucose-Capillary: 106 mg/dL — ABNORMAL HIGH (ref 70–99)
Glucose-Capillary: 112 mg/dL — ABNORMAL HIGH (ref 70–99)
Glucose-Capillary: 146 mg/dL — ABNORMAL HIGH (ref 70–99)
Glucose-Capillary: 157 mg/dL — ABNORMAL HIGH (ref 70–99)
Glucose-Capillary: 172 mg/dL — ABNORMAL HIGH (ref 70–99)
Glucose-Capillary: 67 mg/dL — ABNORMAL LOW (ref 70–99)
Glucose-Capillary: 71 mg/dL (ref 70–99)

## 2023-12-02 LAB — MAGNESIUM: Magnesium: 1.7 mg/dL (ref 1.7–2.4)

## 2023-12-02 LAB — TRIGLYCERIDES: Triglycerides: 121 mg/dL (ref <150)

## 2023-12-02 MED ORDER — DEXTROSE 50 % IV SOLN: 12.5000 g | INTRAVENOUS | Status: AC

## 2023-12-02 MED ORDER — POTASSIUM CHLORIDE 10 MEQ/100ML IV SOLN
10.0000 meq | INTRAVENOUS | Status: AC
  Administered 2023-12-02 (×4): 10 meq via INTRAVENOUS
  Filled 2023-12-02 (×4): qty 100

## 2023-12-02 MED ORDER — DEXTROSE 50 % IV SOLN
INTRAVENOUS | Status: AC
  Administered 2023-12-02: 12.5 g via INTRAVENOUS
  Filled 2023-12-02: qty 50

## 2023-12-02 MED ORDER — MAGNESIUM SULFATE 2 GM/50ML IV SOLN
2.0000 g | Freq: Once | INTRAVENOUS | Status: AC
  Administered 2023-12-02: 2 g via INTRAVENOUS
  Filled 2023-12-02: qty 50

## 2023-12-02 NOTE — Progress Notes (Addendum)
 SLP Cancellation Note  Patient Details Name: Alex [name] MRN: [id number] DOB: 1996/04/01   Cancelled treatment:       Reason Eval/Treat Not Completed: Medical issues which prohibited therapy;Patient not medically ready (chart reviewed)  Per chart notes, pt transferred back to ICU with severe acute hypoxic respiratory failure initially placed on HHFNC but due to significant hypoxia O2 sats in the 60's he required reintubation and underwent emergent bronchoscopy which revealed thick secretions in the LUL and LLL resulting in mucous plugging. As of this date, he remains intubated. Per chart, pt had a tracheostomy and PEG tube (now removed) in 2020.  Per admitting notes, pt was admitted w/ aspiration PNA from his NH.   W/ his swallowing history, current admitting dx w/ lengthy illness/hospitalization now, and recommendation for NPO status per recent ST evaluations, alternative means of feeding may need to be discussed to meet his nutrition/hydration needs fully and safely.   MD to reconsult ST services if medically appropriate and needs indicate.      Comer Portugal, MS, CCC-SLP Speech Language Pathologist Rehab Services; [facility] [phone] (ascom) Roscoe Witts 12/02/2023, 9:13 AM
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
## 2023-12-02 NOTE — Progress Notes (Signed)
 NAME:  Alex [name], MRN:  [id number], DOB:  [birth year], LOS: 13 ADMISSION DATE:  11/19/2023, CHIEF COMPLAINT:  Respiratory Failure   History of Present Illness:   28 yo M presenting to [facility] ED from outpatient rehab on 11/19/23 for evaluation of altered mental status.   History obtained per chart review and mother's telephone interview, patient unable to participate in interview due to respiratory distress and baseline TBI. 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 He was treated at [facility] for 5 months then discharged to rehab. Per his mother and legal guardian his baseline is: Non verbal but he will yell out sporadic nonsensical words with left sided paralysis. He is able to move his RUE in order to feed himself with finger foods and he has some involuntary movement with that arm, he will swat at you. Similar baseline function with RLE, he can kick and move, but often will lay it bent and to the side. He has enough strength to even try and get out of bed on the right side. She denies any issues with swallowing, but eats mostly soft food. If he is not watched closely with eating he will try to eat all the food at once. The facility staff reported he was at his normal baseline until lunchtime on 11/19/23. It was observed he was more somnolent and not interactive. Staff noted a cough and slightly increased work of breathing. Mom also confirmed noting a congested cough the last time she visited earlier in the week. Staff and mom denied nausea/ vomiting, but mom reports chronic loose stools.   EMS reported the patient febrile and tachycardic on arrival.   ED course: Upon arrival patient tachycardic and lethargic. Sepsis protocol initiated with antibiotics and IVF resuscitation. Labs significant for mild hypokalemia, otherwise WNL. Initial imaging unremarkable but then patient became hypoxic with SpO2 85% on RA and a CTa was obtained, negative for PE but concerning for  aspiration pneumonia. TRH consulted for admission. While patient was pending admission in ED he became increasingly hypoxic in the 60's, placed on a NRB without improvement. PCCM alerted the need to emergently intubate the patient. Medications given: Cefepime , Flagyl , Vancomycin , 2 L IVF bolus, IV contrast Initial Vitals: 97.9, 17, 130, 119/82 & 92 on RA Significant labs: (Labs/ Imaging personally reviewed) EKG : pending Chemistry: Na+: 138, K+: 3.4, BUN/Cr.: 13/ 0.65, Serum CO2/ AG: 26/8 Hematology: WBC: 4.3, Hgb: 14.3,  Lactic/ PCT: 1.1 > 1.4 / pending, COVID-19 & Influenza A/B: negative   ABG: pending post intubation   CXR 11/19/23: no active disease CT head wo contrast 11/19/23: No evidence of acute intracranial abnormality. Stable multifocal encephalomalacia, likely the sequela of prior trauma. Similar age-advanced cerebral atrophy CT angio chest PE 11/19/23:  No pulmonary embolus. Near complete filling of the right mainstem bronchus with filling extending into the bronchus intermedius, right upper, middle and lower lobe bronchi. Layering debris in the trachea. Findings are highly suspicious for aspiration. Minimal ill-defined patchy and nodular airspace disease in the dependent right lower lobe, likely postobstructive pneumonia.   PCCM consulted for assistance in management and monitoring due to acute hypoxic respiratory failure requiring urgent intubation and mechanical ventilatory support secondary to suspected aspiration and pneumonia.  Pertinent  Medical History  TBI Seizure Disorder  Significant Hospital Events: Including procedures, antibiotic start and stop dates in addition to other pertinent events   11/20/23: Admit to ICU due to acute hypoxic respiratory failure requiring urgent intubation and  mechanical ventilatory support secondary to suspected aspiration and pneumonia 11/21/23- patient moving RUE, mother at bedside we reviewed medical plan. He remains on 13mcg/kg/hr levophed ,  today plan to rescusitate more aggresively and wean from levophed  and potentially extubate post SBP.   He is febrile this am.  He is on zithromax , unasyn  11/22/23- s/p bronch yesterday with aspiration of mucus plugging.  Today resp status improved with liberation protocol in proces. SLP post extubation today. 11/23/23- patient is +for pseudomonas resp cultures.  He is also MRSA pcr +, refined therapy during rounds with pharmacist. He remains on MV 11/24/23- patient is still on vasopressor support weaning down on MV.  Secretions are slightly better.  11/25/23- patient weaned off levophed , failed SBT with tachypnea, tachycardia.  Secretions much improved. 11/26/23- on minimal vent support, unable to perform SBT due to copious secretions from ETT.   11/27/23- on minimal vent support, secretions improved.  Change Doxycycline  to Vancomycin .  Plan for SBT as tolerated. 11/28/23-Pt successfully extubated 02/4, currently tolerating HHFNC @35L /35%.  Required low dose precedex  overnight to allow suctioning due to excessive secretions. Pt with suspected seizure activity developed tremors in the right upper extremity and became minimally responsive.  Received 1g of iv keppra .    11/29/23-Overnight pt developed sinus tachycardia/svt 140 to 160's improved following 2.5 mg iv metoprolol .  Tolerating RA with no signs of respiratory distress.  Transferring to progressive care unit TRH to pick on 02/7 12/01/23: Pt transferred back to ICU with severe acute hypoxic respiratory failure initially placed on HHFNC but due to significant hypoxia O2 sats in the 60's he required reintubation and underwent emergent bronchoscopy which revealed thick secretions in the LUL and LLL resulting in mucous plugging. Therapeutic aspiration performed. CXR showed collapse of the LUL secondary to mucus plugging  12/02/23: remains sedated, intubated.  Interim History / Subjective:  Intubated and bronched yesterday  Objective   Blood pressure 99/63, pulse 99,  temperature 99 F (37.2 C), temperature source Oral, resp. rate 20, height 5' 9 (1.753 m), weight 63.6 kg, SpO2 100%.    Vent Mode: PRVC FiO2 (%):  [40 %-100 %] 40 % Set Rate:  [20 bmp] 20 bmp Vt Set:  [450 mL] 450 mL PEEP:  [10 cmH20] 10 cmH20 Plateau Pressure:  [18 cmH20-20 cmH20] 18 cmH20   Intake/Output Summary (Last 24 hours) at 12/02/2023 0758 Last data filed at 12/02/2023 0700 Gross per 24 hour  Intake 7863.47 ml  Output 2920 ml  Net 4943.47 ml   Filed Weights   12/01/23 0500 12/01/23 0925 12/02/23 0312  Weight: 63 kg 63.4 kg 63.6 kg    Examination:  Physical Exam Constitutional:      General: He is not in acute distress.    Appearance: He is ill-appearing.  Cardiovascular:     Rate and Rhythm: Regular rhythm. Tachycardia present.     Heart sounds: Normal heart sounds.  Pulmonary:     Breath sounds: No wheezing.     Comments: Ventilated breath sounds bilaterally Abdominal:     Palpations: Abdomen is soft.  Musculoskeletal:     Right lower leg: No edema.     Left lower leg: No edema.  Neurological:     Mental Status: He is disoriented.      Assessment & Plan:   28 year old male with history of TBI and seizure disorder (with baseline left sided hemiparesis) admitted for acute hypoxic respiratory failure secondary to aspiration pneumonia requiring intubation and mechanical ventilation. He failed extubation secondary to  copious secretions last week requiring re-intubation and bronchoscopy to clear secretions (with pseudomonas and MRSA). Recurrent respiratory failure secondary to inability to clear secretion with mucus plugging requiring re-intubation and repeat bronchoscopy.   #Acute Hypoxic Respiratory Failure #Recurrent Aspiration Pneumonia #Pseudomonas and MRSA Pneumonia #Chronic Seizure Disorder #TBI  Neuro - history of TBI, seizure disorder. Was tachycardic with concern for subclinical seizure, not seen on rapid EEG. Will obtain EEG tomorrow. Continue  baclofen , lacosamide , and valproic  acid. Hydromorphone  and Propofol  for analgosedation. CV - sinus tachycardia in the setting of respiratory failure. Developed sedation related hypotension requiring vasopressor support. Goal MAP > 65 with nor-epinephrine  Pulm - respiratory failure secondary to mucus plugging and recurrent aspiration with inability to clear secretions. Improved oxygenation with PEEP and re-expansion of the LUL. Dropping PEEP to 8, now on FiO2 of 40%. Will need tracheostomy tube placement. Renal - kidney function at baseline, replete electrolytes PRN GI - re-initiated tube feeds. PPI for SUP Endo - ICU glycemic protocol Hem/Onc - enoxaparin  for DVT prophylaxis ID - Zosyn  and Linezolid  for aspiration pneumonia with Pseudomonas and MRSA on culture  Best Practice (right click and Reselect all SmartList Selections daily)   Diet/type: tubefeeds DVT prophylaxis LMWH Pressure ulcer(s): N/A GI prophylaxis: PPI Lines: Central line and yes and it is still needed Foley:  Yes, and it is still needed Code Status:  full code Last date of multidisciplinary goals of care discussion [12/02/2023]  Labs   CBC: Recent Labs  Lab 11/28/23 0348 11/29/23 0254 11/30/23 0600 12/01/23 0555 12/02/23 0458  WBC 8.7 6.8 15.8* 13.2* 16.2*  HGB 11.4* 12.4* 12.4* 12.2* 10.9*  HCT 34.4* 37.3* 37.0* 36.9* 33.1*  MCV 93.0 93.3 92.3 95.1 95.9  PLT 431* 460* 577* 452* 536*    Basic Metabolic Panel: Recent Labs  Lab 11/28/23 0348 11/29/23 0254 11/29/23 1709 11/30/23 0600 12/01/23 0555 12/02/23 0458  NA 135 140  --  140 140 139  K 4.0 3.4* 3.7 3.9 3.5 3.1*  CL 103 107  --  105 104 100  CO2 25 25  --  26 25 25   GLUCOSE 96 122*  --  113* 127* 188*  BUN 7 <5*  --  <5* 9 16  CREATININE 0.38* 0.40*  --  0.51* 0.54* 0.56*  CALCIUM 8.4* 8.5*  --  8.6* 8.8* 8.0*  MG 2.3 2.2 2.1 2.0 2.0 1.7  PHOS 2.8 3.5 3.5 3.3 3.3 4.7*   GFR: Estimated Creatinine Clearance: 123.7 mL/min (A) (by C-G formula  based on SCr of 0.56 mg/dL (L)). Recent Labs  Lab 11/29/23 0254 11/30/23 0600 12/01/23 0555 12/01/23 1052 12/01/23 1414 12/01/23 1735 12/02/23 0458  PROCALCITON  --  <0.10 <0.10  --   --   --   --   WBC 6.8 15.8* 13.2*  --   --   --  16.2*  LATICACIDVEN  --   --   --  3.2* 2.2* 1.9  --     Liver Function Tests: Recent Labs  Lab 12/02/23 0458  ALBUMIN  2.5*   No results for input(s): LIPASE, AMYLASE in the last 168 hours. No results for input(s): AMMONIA in the last 168 hours.  ABG    Component Value Date/Time   PHART 7.44 12/01/2023 1143   PCO2ART 34 12/01/2023 1143   PO2ART 63 (L) 12/01/2023 1143   HCO3 23.1 12/01/2023 1143   TCO2 28 05/31/2021 0404   ACIDBASEDEF 0.5 12/01/2023 1143   O2SAT 93 12/01/2023 1143     Coagulation Profile: No  results for input(s): INR, PROTIME in the last 168 hours.  Cardiac Enzymes: No results for input(s): CKTOTAL, CKMB, CKMBINDEX, TROPONINI in the last 168 hours.  HbA1C: Hgb A1c MFr Bld  Date/Time Value Ref Range Status  04/11/2022 06:10 AM 5.1 4.8 - 5.6 % Final    Comment:    (NOTE) Pre diabetes:          5.7%-6.4%  Diabetes:              >6.4%  Glycemic control for   <7.0% adults with diabetes   05/31/2021 04:18 PM 4.9 4.8 - 5.6 % Final    Comment:    (NOTE) Pre diabetes:          5.7%-6.4%  Diabetes:              >6.4%  Glycemic control for   <7.0% adults with diabetes     CBG: Recent Labs  Lab 12/01/23 1154 12/01/23 1611 12/01/23 1948 12/01/23 2344 12/02/23 0349  GLUCAP 126* 167* 121* 177* 172*    Review of Systems:   N/A  Past Medical History:  He,  has a past medical history of 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   Surgical History:   Past Surgical History:  Procedure Laterality Date   GASTROSTOMY TUBE PLACEMENT       Social History:   reports that he has never smoked. He  has never used smokeless tobacco. He reports that he does not currently use alcohol. He reports that he does not use drugs.   Family History:  His family history is not on file.   Allergies No Known Allergies   Home Medications  Prior to Admission medications   Medication Sig Start Date End Date Taking? Authorizing Provider  baclofen  (LIORESAL ) 10 MG tablet Take 10 mg by mouth 3 (three) times daily.   Yes [provider]  diazePAM , 15 MG Dose, (VALTOCO  15 MG DOSE) 2 x 7.5 MG/0.1ML LQPK Spray 7.5 mg into each nostril in the event of a seizure 04/20/22  Yes Perri DELENA Meliton Mickey., MD  gabapentin  (NEURONTIN ) 400 MG capsule Take 1 capsule (400 mg total) by mouth 3 (three) times daily. 04/20/22 11/20/23 Yes Perri DELENA Meliton Mickey., MD  lacosamide  100 MG TABS Take 1 tablet (100 mg total) by mouth 2 (two) times daily. 04/20/22 11/20/23 Yes Perri DELENA Meliton Mickey., MD  melatonin 5 MG TABS Take 10 mg by mouth at bedtime.   Yes [provider]  QUEtiapine  (SEROQUEL  XR) 200 MG 24 hr tablet Take 200 mg by mouth in the morning.   Yes [provider]  QUEtiapine  (SEROQUEL ) 25 MG tablet Take 25 mg by mouth at bedtime.   Yes [provider]  valproic  acid (DEPAKENE ) 250 MG capsule Take 4 capsules (1,000 mg total) by mouth 2 (two) times daily. Patient taking differently: Take 500 mg by mouth every morning. 04/20/22 11/20/23 Yes Perri DELENA Meliton Mickey., MD  valproic  acid (DEPAKENE ) 250 MG capsule Take 750 mg by mouth every evening.   Yes [provider]     Critical care time: 37 minutes    Belva November, MD [facility] 12/02/2023 9:05 AM

## 2023-12-02 NOTE — Progress Notes (Signed)
 Patient having small episodes of bile emesis draining from mouth. Tube feeding stopped, patient suctioned. NG tube placed on continuous suction at 80 per Dr. Isadora verbal order at 1127. Obtained 1,700 mls of yellow bile output. NG tube then placed to low/intermittent suction.

## 2023-12-02 NOTE — Plan of Care (Signed)
  Problem: Fluid Volume: Goal: Hemodynamic stability will improve Outcome: Progressing   Problem: Respiratory: Goal: Ability to maintain adequate ventilation will improve Outcome: Progressing   Problem: Respiratory: Goal: Ability to maintain a clear airway and adequate ventilation will improve Outcome: Progressing   Problem: Nutrition: Goal: Adequate nutrition will be maintained Outcome: Not Progressing

## 2023-12-02 NOTE — Plan of Care (Signed)
  Problem: Fluid Volume: Goal: Hemodynamic stability will improve Outcome: Progressing   Problem: Respiratory: Goal: Ability to maintain adequate ventilation will improve Outcome: Progressing   

## 2023-12-02 NOTE — Progress Notes (Signed)
 Date of Admission:  11/19/2023     ID: Alex [name] is a 28 y.o. male  Principal Problem:   Aspiration pneumonia (HCC) Active Problems:   Acute hypoxic respiratory failure (HCC)   Seizure disorder (HCC)   Septic shock (HCC)   Acute metabolic encephalopathy   Hypokalemia   Pressure injury of skin   Reactive thrombocytosis   Pt had acute hypoxia yestereday and had to be re-intubated and uderwent bronch for thick secretions blocking  the left main brochus with collapse consolidation   Medications:   baclofen   10 mg Per Tube TID   Chlorhexidine  Gluconate Cloth  6 each Topical Nightly   enoxaparin  (LOVENOX ) injection  40 mg Subcutaneous Q24H   feeding supplement (PROSource TF20)  60 mL Per Tube Daily   free water   100 mL Per Tube Q4H   ipratropium-albuterol   3 mL Nebulization Q6H   lacosamide   100 mg Per Tube BID   nutrition supplement (JUVEN)  1 packet Per Tube BID BM   mouth rinse  15 mL Mouth Rinse Q2H   pantoprazole  (PROTONIX ) IV  40 mg Intravenous Q12H   QUEtiapine   50 mg Per Tube BID   sodium chloride  flush  10-40 mL Intracatheter Q12H   thiamine   100 mg Per Tube Daily   valproic  acid  500 mg Per Tube Daily   valproic  acid  750 mg Per Tube QHS    Objective: Vital signs in last 24 hours: Patient Vitals for the past 24 hrs:  BP Temp Temp src Pulse Resp SpO2 Weight  12/02/23 1415 (!) 128/91 -- -- (!) 145 (!) 22 97 % --  12/02/23 1400 114/66 -- -- (!) 134 20 98 % --  12/02/23 1345 105/68 -- -- (!) 131 20 99 % --  12/02/23 1330 104/67 -- -- (!) 123 20 99 % --  12/02/23 1315 103/63 -- -- (!) 120 20 100 % --  12/02/23 1300 105/62 -- -- (!) 119 20 100 % --  12/02/23 1245 97/60 -- -- (!) 119 20 100 % --  12/02/23 1230 93/66 -- -- (!) 122 20 100 % --  12/02/23 1215 105/74 -- -- (!) 125 20 100 % --  12/02/23 1214 -- 98.4 F (36.9 C) Axillary -- -- -- --  12/02/23 1200 106/65 -- -- (!) 129 20 100 % --  12/02/23 1145 (!) 109/58 -- -- (!) 133 20 100 % --  12/02/23 1130  128/73 -- -- (!) 136 20 100 % --  12/02/23 1115 (!) 133/112 -- -- (!) 119 20 100 % --  12/02/23 1100 113/79 -- -- (!) 114 20 100 % --  12/02/23 1045 106/64 -- -- (!) 109 20 100 % --  12/02/23 1030 103/61 -- -- (!) 106 20 100 % --  12/02/23 1015 103/60 -- -- (!) 104 20 100 % --  12/02/23 1000 106/67 -- -- (!) 105 20 100 % --  12/02/23 0945 111/73 -- -- (!) 104 20 100 % --  12/02/23 0930 119/75 -- -- 96 20 100 % --  12/02/23 0915 104/66 -- -- (!) 103 20 100 % --  12/02/23 0900 106/74 -- -- (!) 103 20 100 % --  12/02/23 0845 106/65 -- -- (!) 103 20 100 % --  12/02/23 0830 104/61 -- -- (!) 104 20 100 % --  12/02/23 0822 -- 98.3 F (36.8 C) Axillary -- -- -- --  12/02/23 0815 107/62 -- -- (!) 106 20 100 % --  12/02/23 0800 110/64 -- -- (!) 105 20 100 % --  12/02/23 0745 100/62 -- -- 100 20 100 % --  12/02/23 0730 100/66 -- -- 98 20 100 % --  12/02/23 0715 (!) 93/55 -- -- (!) 101 20 100 % --  12/02/23 0700 99/63 -- -- 99 20 100 % --  12/02/23 0645 (!) 92/59 -- -- (!) 101 20 100 % --  12/02/23 0630 (!) 93/58 -- -- 100 20 100 % --  12/02/23 0615 (!) 93/58 -- -- (!) 102 20 100 % --  12/02/23 0600 (!) 94/57 -- -- (!) 103 20 100 % --  12/02/23 0545 100/63 -- -- (!) 110 20 100 % --  12/02/23 0530 98/66 -- -- (!) 110 20 100 % --  12/02/23 0515 100/65 -- -- (!) 108 20 100 % --  12/02/23 0500 98/65 -- -- (!) 109 20 100 % --  12/02/23 0445 91/63 -- -- (!) 113 19 100 % --  12/02/23 0430 97/61 -- -- (!) 113 20 100 % --  12/02/23 0415 94/63 -- -- (!) 114 19 100 % --  12/02/23 0400 105/62 99 F (37.2 C) Oral (!) 114 20 100 % --  12/02/23 0345 101/60 -- -- (!) 114 20 100 % --  12/02/23 0330 101/62 -- -- (!) 115 20 100 % --  12/02/23 0315 94/61 -- -- (!) 112 20 100 % --  12/02/23 0312 -- -- -- -- -- -- 63.6 kg  12/02/23 0300 97/60 -- -- (!) 104 20 100 % --  12/02/23 0245 (!) 91/54 -- -- (!) 105 20 100 % --  12/02/23 0230 98/62 -- -- (!) 103 20 100 % --  12/02/23 0215 (!) 95/58 -- -- (!) 107 20 100  % --  12/02/23 0200 95/62 99 F (37.2 C) Oral (!) 108 20 99 % --  12/02/23 0145 (!) 91/58 -- -- (!) 110 20 99 % --  12/02/23 0130 (!) 93/57 -- -- (!) 108 20 99 % --  12/02/23 0115 (!) 94/59 -- -- (!) 110 20 98 % --  12/02/23 0100 (!) 97/56 -- -- (!) 110 20 98 % --  12/02/23 0045 (!) 97/57 -- -- (!) 111 20 99 % --  12/02/23 0030 (!) 94/56 -- -- (!) 110 20 98 % --  12/02/23 0015 (!) 91/55 -- -- (!) 111 20 98 % --  12/02/23 0000 (!) 99/58 -- Oral (!) 111 20 97 % --  12/01/23 2345 98/60 -- -- (!) 108 20 98 % --  12/01/23 2330 92/60 -- -- (!) 110 20 98 % --  12/01/23 2315 99/62 -- -- (!) 106 20 98 % --  12/01/23 2300 (!) 94/57 -- -- (!) 108 20 98 % --  12/01/23 2245 (!) 91/55 -- -- (!) 105 20 100 % --  12/01/23 2230 (!) 91/55 -- -- (!) 104 20 100 % --  12/01/23 2215 (!) 84/55 -- -- (!) 109 20 99 % --  12/01/23 2200 (!) 76/59 98.6 F (37 C) Oral (!) 115 20 100 % --  12/01/23 2145 (!) 89/61 -- -- (!) 125 20 100 % --  12/01/23 2130 (!) 99/59 -- -- (!) 124 20 100 % --  12/01/23 2115 (!) 100/58 -- -- (!) 126 20 100 % --  12/01/23 2100 (!) 102/57 -- -- (!) 129 20 100 % --  12/01/23 2000 (!) 94/59 98.6 F (37 C) Oral (!) 123 20 100 % --  12/01/23 1900 (!)  85/51 -- -- -- 20 -- --  12/01/23 1830 (!) 83/55 -- -- -- 20 -- --  12/01/23 1800 109/63 -- -- -- (!) 27 -- --  12/01/23 1730 109/65 98.8 F (37.1 C) Axillary -- (!) 27 -- --  12/01/23 1700 117/74 -- -- -- (!) 27 -- --  12/01/23 1630 117/85 -- -- -- (!) 28 -- --  12/01/23 1600 122/85 -- -- (!) 134 (!) 27 100 % --  12/01/23 1530 129/89 -- -- -- (!) 28 -- --  12/01/23 1515 124/79 -- -- -- (!) 29 -- --  12/01/23 1500 115/83 -- -- (!) 133 (!) 28 100 % --  12/01/23 1453 -- -- -- (!) 138 (!) 34 100 % --  12/01/23 1445 126/82 -- -- (!) 137 (!) 43 100 % --       PHYSICAL EXAM:  General: intubated, sedated.  Lungs: b/l air entry- crepts left side Heart: Tachycardia Abdomen: Soft, non-tender,not distended. Bowel sounds normal. No  masses Extremities: atraumatic, no cyanosis. No edema. No clubbing Skin: No rashes or lesions. Or bruising Lymph: Cervical, supraclavicular normal. Neurologic: cannot assess  Lab Results    Latest Ref Rng & Units 12/02/2023    4:58 AM 12/01/2023    5:55 AM 11/30/2023    6:00 AM  CBC  WBC 4.0 - 10.5 K/uL 16.2  13.2  15.8   Hemoglobin 13.0 - 17.0 g/dL 89.0  87.7  87.5   Hematocrit 39.0 - 52.0 % 33.1  36.9  37.0   Platelets 150 - 400 K/uL 536  452  577        Latest Ref Rng & Units 12/02/2023    4:58 AM 12/01/2023    5:55 AM 11/30/2023    6:00 AM  CMP  Glucose 70 - 99 mg/dL 811  872  886   BUN 6 - 20 mg/dL 16  9  <5   Creatinine 0.61 - 1.24 mg/dL 9.43  9.45  9.48   Sodium 135 - 145 mmol/L 139  140  140   Potassium 3.5 - 5.1 mmol/L 3.1  3.5  3.9   Chloride 98 - 111 mmol/L 100  104  105   CO2 22 - 32 mmol/L 25  25  26    Calcium 8.9 - 10.3 mg/dL 8.0  8.8  8.6       Microbiology:  Studies/Results: 2/7 left sided infiltrate   2/8 post intubation Left side infitrate   Assessment/Plan: Pt with TBI and spastic quadriparesis Admitted with altered mental status Acute hypoxic resp failure due to aspiraiton- was intubated  Rt side main bronchis was filled with debris Underwent bronchoscopy on 11/21/23- culture psueodomas and MRSA Was treated with many antibiotics including levaquin  For MRSA it was just Doxy Pt was liberated from Vent on 2/4 New fever and leucocytosis today Cdiff neg CT- shows debris and occlusion of left main bronchus and lower lobe collapse consolidation Restarted on levaquin   DC levaquin  and started  Zosyn  for aspiration to cover pseudomonas, anerobes on 11/30/23 night  Acute hypoxic resp failure had to be reintubated on 12/01/23 and then Linezolid  was for MRSA coverage on 2/8 Bronch done to clear the left main stem bronchus   Pt had tracheostomy and PG in 2020 and it has been closed- as per mom he takes orally putting him at risk for aspiration   Leucocytosis    Seizure disorder on lacosamide  and valproate   Discussed the management with the care team

## 2023-12-02 NOTE — Procedures (Addendum)
 Patient Name: Alex [name]  MRN: [id number]  Epilepsy Attending: Arlin MALVA Krebs  Referring Physician/Provider: Isadora Hose, MD  Duration: 12/01/2023 2026 to 12/02/2023 1101  Patient history: 28 year old male with history of TBI and seizure disorder (with baseline left sided hemiparesis) admitted for acute hypoxic respiratory failure secondary to aspiration pneumonia requiring intubation and mechanical ventilation. EEG to evaluate for seizure  Level of alertness: comatose  AEDs during EEG study: LCM, VP, Propofol   Technical aspects: This EEG was obtained using a 10 lead EEG system positioned circumferentially without any parasagittal coverage (rapid EEG). Computer selected EEG is reviewed as  well as background features and all clinically significant events.  Description: EEG showed continuous generalized and lateralized left hemisphere 3-7hz  theta-delta slowing. Hyperventilation and photic stimulation were not performed.      ABNORMALITY -Continuous slow, generalized and lateralized left hemisphere   IMPRESSION: This limited ceribell EEG is suggestive of cortical dysfunction in left hemisphere likely secondary to underlying structural abnormality.  Additionally there is moderate to severe diffuse encephalopathy No seizures or epileptiform discharges were seen throughout the recording.   Eliese Kerwood O Laneshia Pina

## 2023-12-02 NOTE — Progress Notes (Signed)
 Verbal order from Dr. Darnelle Elders to increase propofol  from 35 to 50.

## 2023-12-03 ENCOUNTER — Inpatient Hospital Stay: Payer: Medicaid Other

## 2023-12-03 ENCOUNTER — Ambulatory Visit: Payer: Medicaid Other

## 2023-12-03 DIAGNOSIS — R569 Unspecified convulsions: Secondary | ICD-10-CM | POA: Diagnosis not present

## 2023-12-03 DIAGNOSIS — J9601 Acute respiratory failure with hypoxia: Secondary | ICD-10-CM | POA: Diagnosis not present

## 2023-12-03 DIAGNOSIS — J690 Pneumonitis due to inhalation of food and vomit: Secondary | ICD-10-CM | POA: Diagnosis not present

## 2023-12-03 DIAGNOSIS — R1310 Dysphagia, unspecified: Secondary | ICD-10-CM | POA: Diagnosis not present

## 2023-12-03 DIAGNOSIS — J189 Pneumonia, unspecified organism: Secondary | ICD-10-CM | POA: Diagnosis not present

## 2023-12-03 LAB — RENAL FUNCTION PANEL
Albumin: 2.6 g/dL — ABNORMAL LOW (ref 3.5–5.0)
Anion gap: 12 (ref 5–15)
BUN: 11 mg/dL (ref 6–20)
CO2: 26 mmol/L (ref 22–32)
Calcium: 8.4 mg/dL — ABNORMAL LOW (ref 8.9–10.3)
Chloride: 100 mmol/L (ref 98–111)
Creatinine, Ser: 0.59 mg/dL — ABNORMAL LOW (ref 0.61–1.24)
GFR, Estimated: >60 mL/min (ref >60)
Glucose, Bld: 101 mg/dL — ABNORMAL HIGH (ref 70–99)
Phosphorus: 4.7 mg/dL — ABNORMAL HIGH (ref 2.5–4.6)
Potassium: 4.7 mmol/L (ref 3.5–5.1)
Sodium: 138 mmol/L (ref 135–145)

## 2023-12-03 LAB — APTT: aPTT: 46 s — ABNORMAL HIGH (ref 24–36)

## 2023-12-03 LAB — GLUCOSE, CAPILLARY
Glucose-Capillary: 93 mg/dL (ref 70–99)
Glucose-Capillary: 75 mg/dL (ref 70–99)
Glucose-Capillary: 104 mg/dL — ABNORMAL HIGH (ref 70–99)
Glucose-Capillary: 106 mg/dL — ABNORMAL HIGH (ref 70–99)
Glucose-Capillary: 103 mg/dL — ABNORMAL HIGH (ref 70–99)
Glucose-Capillary: 117 mg/dL — ABNORMAL HIGH (ref 70–99)

## 2023-12-03 LAB — CBC
HCT: 33.4 % — ABNORMAL LOW (ref 39.0–52.0)
Hemoglobin: 10.6 g/dL — ABNORMAL LOW (ref 13.0–17.0)
MCH: 31.2 pg (ref 26.0–34.0)
MCHC: 31.7 g/dL (ref 30.0–36.0)
MCV: 98.2 fL (ref 80.0–100.0)
Platelets: 417 10*3/uL — ABNORMAL HIGH (ref 150–400)
RBC: 3.4 MIL/uL — ABNORMAL LOW (ref 4.22–5.81)
RDW: 14.2 % (ref 11.5–15.5)
WBC: 8.7 10*3/uL (ref 4.0–10.5)
nRBC: 0 % (ref 0.0–0.2)

## 2023-12-03 LAB — PROTIME-INR
INR: 1 (ref 0.8–1.2)
Prothrombin Time: 13.7 s (ref 11.4–15.2)

## 2023-12-03 LAB — MAGNESIUM: Magnesium: 2.6 mg/dL — ABNORMAL HIGH (ref 1.7–2.4)

## 2023-12-03 MED ORDER — OSMOLITE 1.5 CAL PO LIQD
1000.0000 mL | ORAL | Status: DC
  Administered 2023-12-03 – 2023-12-05 (×2): 1000 mL

## 2023-12-03 NOTE — Progress Notes (Signed)
 Eeg done

## 2023-12-03 NOTE — Procedures (Signed)
 Routine EEG Report  Alex [name] is a 28 y.o. male with a history of seizure who is undergoing an EEG to evaluate for seizures.  Report: This EEG was acquired with electrodes placed according to the International 10-20 electrode system (including Fp1, Fp2, F3, F4, C3, C4, P3, P4, O1, O2, T3, T4, T5, T6, A1, A2, Fz, Cz, Pz). The following electrodes were missing or displaced: none.  The background was primarily composed of 3-5 Hz activity. This activity is reactive to stimulation. There was no clear waking rhythm or sleep architecture. There was no focal slowing. There were no interictal epileptiform discharges. There were no electrographic seizures identified. Photic stimulation and hyperventilation were not performed.  Impression and clinical correlation: This EEG was obtained while sedated on propofol  and comatose and is abnormal due to severe diffuse slowing indicative of global cerebral dysfunction, medication effect, or both. Epileptiform abnormalities were not seen during this recording.  Greg Leaks, MD [facility] [phone]  If 7pm- 7am, please page neurology on call as listed in AMION.

## 2023-12-03 NOTE — Progress Notes (Signed)
 NAME:  Alex [name], MRN:  [id number], DOB:  1995-12-09, LOS: 14 ADMISSION DATE:  11/19/2023, CONSULTATION DATE:  11/20/23 REFERRING MD:  Dr. Achilles Holes, CHIEF COMPLAINT:  AMS   Brief Pt Description / Synopsis:  28 y.o. male with PMHx significant for chronic TBI with left sided hemiparesis admitted with Acute Metabolic Encephalopathy and Acute Hypoxic Respiratory Failure in the setting of aspiration and Pseudomonas and MRSA Pneumonia requiring intubation and mechanical ventilation.   History of Present Illness:  28 yo M presenting to [facility] ED from outpatient rehab on 11/19/23 for evaluation of altered mental status.   History obtained per chart review and mother's telephone interview, patient unable to participate in interview due to respiratory distress and baseline TBI. 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 He was treated at [facility] for 5 months then discharged to rehab. Per his mother and legal guardian his baseline is: Non verbal but he will yell out sporadic nonsensical words with left sided paralysis. He is able to move his RUE in order to feed himself with finger foods and he has some involuntary movement with that arm, he will swat at you. Similar baseline function with RLE, he can kick and move, but often will lay it bent and to the side. He has enough strength to even try and get out of bed on the right side. She denies any issues with swallowing, but eats mostly soft food. If he is not watched closely with eating he will try to eat all the food at once. The facility staff reported he was at his normal baseline until lunchtime on 11/19/23. It was observed he was more somnolent and not interactive. Staff noted a cough and slightly increased work of breathing. Mom also confirmed noting a congested cough the last time she visited earlier in the week. Staff and mom denied nausea/ vomiting, but mom reports chronic loose stools.   EMS reported the patient febrile and  tachycardic on arrival.   ED course: Upon arrival patient tachycardic and lethargic. Sepsis protocol initiated with antibiotics and IVF resuscitation. Labs significant for mild hypokalemia, otherwise WNL. Initial imaging unremarkable but then patient became hypoxic with SpO2 85% on RA and a CTa was obtained, negative for PE but concerning for aspiration pneumonia. TRH consulted for admission. While patient was pending admission in ED he became increasingly hypoxic in the 60's, placed on a NRB without improvement. PCCM alerted the need to emergently intubate the patient. Medications given: Cefepime , Flagyl , Vancomycin , 2 L IVF bolus, IV contrast Initial Vitals: 97.9, 17, 130, 119/82 & 92 on RA Significant labs: (Labs/ Imaging personally reviewed) EKG : pending Chemistry: Na+: 138, K+: 3.4, BUN/Cr.: 13/ 0.65, Serum CO2/ AG: 26/8 Hematology: WBC: 4.3, Hgb: 14.3,  Lactic/ PCT: 1.1 > 1.4 / pending, COVID-19 & Influenza A/B: negative   ABG: pending post intubation   CXR 11/19/23: no active disease CT head wo contrast 11/19/23: No evidence of acute intracranial abnormality. Stable multifocal encephalomalacia, likely the sequela of prior trauma. Similar age-advanced cerebral atrophy CT angio chest PE 11/19/23:  No pulmonary embolus. Near complete filling of the right mainstem bronchus with filling extending into the bronchus intermedius, right upper, middle and lower lobe bronchi. Layering debris in the trachea. Findings are highly suspicious for aspiration. Minimal ill-defined patchy and nodular airspace disease in the dependent right lower lobe, likely postobstructive pneumonia.   PCCM consulted for assistance in management and monitoring due to acute hypoxic respiratory failure requiring urgent  intubation and mechanical ventilatory support secondary to suspected aspiration and pneumonia.  Please see "Significant Hospital Events" section below for full detailed hospital course.   Pertinent  Medical  History  Seizures TBI (09/2019)  Micro Data:  1/27: SARS-CoV-2/flu/RSV PCR>> negative 1/27: Blood culture x 2>> no growth 1/28: MRSA PCR>>Positive  1/28: Tracheal aspirate>>Pseudomonas aeruginosa and Staphylococcus aureus 1/28: HIV screen>> nonreactive 1/29: BAL>>Pseudomonas aeruginosa & MRSA 2/07: Cdiff>>negative   Antimicrobials:   Anti-infectives (From admission, onward)    Start     Dose/Rate Route Frequency Ordered Stop   12/01/23 2200  vancomycin  (VANCOREADY) IVPB 750 mg/150 mL  Status:  Discontinued        750 mg 150 mL/hr over 60 Minutes Intravenous Every 8 hours 12/01/23 1205 12/01/23 1215   12/01/23 1400  vancomycin  (VANCOREADY) IVPB 1250 mg/250 mL  Status:  Discontinued        1,250 mg 166.7 mL/hr over 90 Minutes Intravenous  Once 12/01/23 1205 12/01/23 1215   12/01/23 1315  linezolid  (ZYVOX ) IVPB 600 mg        600 mg 300 mL/hr over 60 Minutes Intravenous Every 12 hours 12/01/23 1215     12/01/23 0230  piperacillin -tazobactam (ZOSYN ) IVPB 3.375 g        3.375 g 12.5 mL/hr over 240 Minutes Intravenous Every 8 hours 12/01/23 0131     11/30/23 1500  levofloxacin  (LEVAQUIN ) IVPB 750 mg  Status:  Discontinued        750 mg 100 mL/hr over 90 Minutes Intravenous Every 24 hours 11/30/23 0345 12/01/23 0048   11/28/23 0100  vancomycin  (VANCOREADY) IVPB 1250 mg/250 mL       Placed in "Followed by" Linked Group   1,250 mg 166.7 mL/hr over 90 Minutes Intravenous Every 12 hours 11/27/23 1153 11/29/23 1331   11/27/23 1300  vancomycin  (VANCOREADY) IVPB 1500 mg/300 mL       Placed in "Followed by" Linked Group   1,500 mg 150 mL/hr over 120 Minutes Intravenous  Once 11/27/23 1153 11/27/23 1516   11/23/23 1500  levofloxacin  (LEVAQUIN ) IVPB 750 mg        750 mg 100 mL/hr over 90 Minutes Intravenous Every 24 hours 11/23/23 1341 11/29/23 1551   11/21/23 2200  doxycycline  (VIBRAMYCIN ) 100 mg in sodium chloride  0.9 % 250 mL IVPB  Status:  Discontinued        100 mg 125 mL/hr over 120  Minutes Intravenous Every 12 hours 11/21/23 1425 11/27/23 1148   11/21/23 2000  metroNIDAZOLE  (FLAGYL ) IVPB 500 mg  Status:  Discontinued        500 mg 100 mL/hr over 60 Minutes Intravenous Every 12 hours 11/21/23 1425 11/24/23 1227   11/20/23 1200  Ampicillin -Sulbactam (UNASYN ) 3 g in sodium chloride  0.9 % 100 mL IVPB  Status:  Discontinued        3 g 200 mL/hr over 30 Minutes Intravenous Every 6 hours 11/20/23 1131 11/21/23 1423   11/20/23 0800  cefTRIAXone  (ROCEPHIN ) 2 g in sodium chloride  0.9 % 100 mL IVPB  Status:  Discontinued        2 g 200 mL/hr over 30 Minutes Intravenous Every 24 hours 11/19/23 2322 11/20/23 0229   11/20/23 0100  azithromycin  (ZITHROMAX ) 500 mg in sodium chloride  0.9 % 250 mL IVPB  Status:  Discontinued        500 mg 250 mL/hr over 60 Minutes Intravenous Every 24 hours 11/19/23 2322 11/21/23 1423   11/19/23 1545  ceFEPIme  (MAXIPIME ) 2 g in sodium chloride  0.9 %  100 mL IVPB        2 g 200 mL/hr over 30 Minutes Intravenous  Once 11/19/23 1541 11/19/23 1630   11/19/23 1545  metroNIDAZOLE  (FLAGYL ) IVPB 500 mg        500 mg 100 mL/hr over 60 Minutes Intravenous  Once 11/19/23 1541 11/19/23 1748   11/19/23 1545  vancomycin  (VANCOCIN ) IVPB 1000 mg/200 mL premix        1,000 mg 200 mL/hr over 60 Minutes Intravenous  Once 11/19/23 1541 11/19/23 1917      Significant Hospital Events: Including procedures, antibiotic start and stop dates in addition to other pertinent events   11/20/23: Admit to ICU due to acute hypoxic respiratory failure requiring urgent intubation and mechanical ventilatory support secondary to suspected aspiration and pneumonia 11/21/23- patient moving RUE, mother at bedside we reviewed medical plan. He remains on 13mcg/kg/hr levophed , today plan to rescusitate more aggresively and wean from levophed  and potentially extubate post SBP.   He is febrile this am.  He is on zithromax , unasyn  11/22/23- s/p bronch yesterday with aspiration of mucus plugging.   Today resp status improved with liberation protocol in proces. SLP post extubation today. 11/23/23- patient is +for pseudomonas resp cultures.  He is also MRSA pcr +, refined therapy during rounds with pharmacist. He remains on MV 11/24/23- patient is still on vasopressor support weaning down on MV.  Secretions are slightly better.  11/25/23- patient weaned off levophed , failed SBT with tachypnea, tachycardia.  Secretions much improved. 11/26/23- on minimal vent support, unable to perform SBT due to copious secretions from ETT.   11/27/23- on minimal vent support, secretions improved.  Change Doxycycline  to Vancomycin .  Plan for SBT as tolerated. 11/28/23-Pt successfully extubated 02/4, currently tolerating HHFNC @35L /35%.  Required low dose precedex  overnight to allow suctioning due to excessive secretions. Pt with suspected seizure activity developed tremors in the right upper extremity and became minimally responsive.  Received 1g of iv keppra .    11/29/23-Overnight pt developed sinus tachycardia/svt 140 to 160's improved following 2.5 mg iv metoprolol .  Tolerating RA with no signs of respiratory distress.  Transferring to progressive care unit TRH to pick on 02/7 12/01/23: Pt transferred back to ICU with severe acute hypoxic respiratory failure initially placed on HHFNC but due to significant hypoxia O2 sats in the 60's he required reintubation and underwent emergent bronchoscopy which revealed thick secretions in the LUL and LLL resulting in mucous plugging. Therapeutic aspiration performed. CXR showed collapse of the LUL secondary to mucus plugging  12/03/23: Pt remains mechanically intubated on minimal vent settings.  ENT consulted for tracheostomy placement due to recurrent respiratory failure due to inability to clear secretions.  Requiring levophed  gtt to maintain map 65 or higher   Interim History / Subjective:  As outlined above under significant events   Objective   Blood pressure (!) 120/90, pulse (!)  128, temperature 98.5 F (36.9 C), temperature source Axillary, resp. rate 20, height 5\' 9"  (1.753 m), weight 61.4 kg, SpO2 94%.    Vent Mode: PRVC FiO2 (%):  [28 %-100 %] 28 % Set Rate:  [20 bmp] 20 bmp Vt Set:  [450 mL] 450 mL PEEP:  [8 cmH20] 8 cmH20 Plateau Pressure:  [20 cmH20] 20 cmH20   Intake/Output Summary (Last 24 hours) at 12/03/2023 0943 Last data filed at 12/03/2023 0800 Gross per 24 hour  Intake 2381.05 ml  Output 3320 ml  Net -938.95 ml   Filed Weights   12/01/23 0925 12/02/23 0312 12/03/23 0410  Weight: 63.4 kg 63.6 kg 61.4 kg    Examination: General: Acute on chronically-ill appearing frail male, NAD mechanically intubated   HENT: Atraumatic, normocephalic, neck supple, no JVD Lungs: Rhonchi throughout, even, non labored  Cardiovascular: Sinus tachycardia, s1s2, no m/r/g, 2+ radial/2+ distal pulses, trace generalized edema  Abdomen: +BS x4, soft, non distended  Extremities: Left upper and lower extremities contractured, bilateral foot drop Neuro: Left-sided hemiparesis with left arm contractured (baseline), opens eyes to voice and tracks GU: Indwelling foley catheter in place draining yellow urine   Resolved Hospital Problem list   Mechanical intubation   Assessment & Plan:   #Sinus tachycardia/SVT  #Sepsis  #Hypotension secondary to sedating medication and possible recurrent sepsis  - Continuous telemetry monitoring  - IV fluid resuscitation and prn levophed  gtt to maintain map 65 or higher  - Trend lactic acid until normalized   #Acute hypoxic respiratory failure secondary to aspiration, LUL/LLL mucous plugging, and collapse of the LUL due to mucous plugging in the setting of TBI s/p emergent bronchoscopy 02/8 #Mechanical ventilation  - Full vent support for now: vent settings reviewed and established  - Continue lung protective strategies  - Maintain plateau pressures less than 30 cm H2O - Maintain O2 sats 92% or higher  - Prn bronchodilator therapy   - Aggressive pulmonary hygiene - ENT consulted for tracheostomy placement   #Pseudomonas & MRSA pneumonia #Aspiration pneumonia  - Trend WBC and monitor fever curve  - Continue zosyn  and linezolid   - ID consulted appreciate input   #Chronic Seizure Disorder #Chronic TBI Recent valproic  acid level was elevated. - Continue outpatient vimpat  and valproic  acid  - Continue outpatient baclofen   - Continue seroquel   - Seizure precautions   #Dysphagia - NGT to LIS due small episodes of emesis on 02/9 with 1.7L of bile output; this has now decreased significantly will obtain KUB and if negative will start trickle TF's  - Will likely need PEG tube placement     Best Practice (right click and "Reselect all SmartList Selections" daily)   Diet/type: NPO, tube feeds DVT prophylaxis: LMWH GI prophylaxis: PPI Lines: right internal jugular central line, and is still needed Foley: Yes will consider discontinuing  Code Status:  full code Last date of multidisciplinary goals of care discussion [12/03/23]  2/10: Pts mother updated at bedside and has agreed to ENT consult for tracheostomy placement  Labs   CBC: Recent Labs  Lab 11/29/23 0254 11/30/23 0600 12/01/23 0555 12/02/23 0458 12/03/23 0431  WBC 6.8 15.8* 13.2* 16.2* 8.7  HGB 12.4* 12.4* 12.2* 10.9* 10.6*  HCT 37.3* 37.0* 36.9* 33.1* 33.4*  MCV 93.3 92.3 95.1 95.9 98.2  PLT 460* 577* 452* 536* 417*    Basic Metabolic Panel: Recent Labs  Lab 11/29/23 0254 11/29/23 1709 11/30/23 0600 12/01/23 0555 12/02/23 0458 12/03/23 0431  NA 140  --  140 140 139 138  K 3.4* 3.7 3.9 3.5 3.1* 4.7  CL 107  --  105 104 100 100  CO2 25  --  26 25 25 26   GLUCOSE 122*  --  113* 127* 188* 101*  BUN <5*  --  <5* 9 16 11   CREATININE 0.40*  --  0.51* 0.54* 0.56* 0.59*  CALCIUM 8.5*  --  8.6* 8.8* 8.0* 8.4*  MG 2.2 2.1 2.0 2.0 1.7 2.6*  PHOS 3.5 3.5 3.3 3.3 4.7* 4.7*   GFR: Estimated Creatinine Clearance: 119.4 mL/min (A) (by C-G formula  based on SCr of 0.59 mg/dL (L)). Recent Labs  Lab 11/30/23 0600 12/01/23 0555 12/01/23 1052 12/01/23 1414 12/01/23 1735 12/02/23 0458 12/03/23 0431  PROCALCITON <0.10 <0.10  --   --   --   --   --   WBC 15.8* 13.2*  --   --   --  16.2* 8.7  LATICACIDVEN  --   --  3.2* 2.2* 1.9  --   --     Liver Function Tests: Recent Labs  Lab 12/02/23 0458 12/03/23 0431  ALBUMIN  2.5* 2.6*    No results for input(s): "LIPASE", "AMYLASE" in the last 168 hours. No results for input(s): "AMMONIA" in the last 168 hours.  ABG    Component Value Date/Time   PHART 7.44 12/01/2023 1143   PCO2ART 34 12/01/2023 1143   PO2ART 63 (L) 12/01/2023 1143   HCO3 23.1 12/01/2023 1143   TCO2 28 05/31/2021 0404   ACIDBASEDEF 0.5 12/01/2023 1143   O2SAT 93 12/01/2023 1143     Coagulation Profile: No results for input(s): "INR", "PROTIME" in the last 168 hours.   Cardiac Enzymes: No results for input(s): "CKTOTAL", "CKMB", "CKMBINDEX", "TROPONINI" in the last 168 hours.  HbA1C: Hgb A1c MFr Bld  Date/Time Value Ref Range Status  04/11/2022 06:10 AM 5.1 4.8 - 5.6 % Final    Comment:    (NOTE) Pre diabetes:          5.7%-6.4%  Diabetes:              >6.4%  Glycemic control for   <7.0% adults with diabetes   05/31/2021 04:18 PM 4.9 4.8 - 5.6 % Final    Comment:    (NOTE) Pre diabetes:          5.7%-6.4%  Diabetes:              >6.4%  Glycemic control for   <7.0% adults with diabetes     CBG: Recent Labs  Lab 12/02/23 1921 12/02/23 1946 12/02/23 2346 12/03/23 0410 12/03/23 0734  GLUCAP 67* 157* 106* 93 75    Review of Systems:   Unable to assess pt nonverbal   Past Medical History:  He,  has a past medical history of 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   Surgical History:   Past Surgical History:  Procedure Laterality Date   GASTROSTOMY TUBE PLACEMENT       Social  History:   reports that he has never smoked. He has never used smokeless tobacco. He reports that he does not currently use alcohol. He reports that he does not use drugs.   Family History:  His family history is not on file.   Allergies No Known Allergies   Home Medications  Prior to Admission medications   Medication Sig Start Date End Date Taking? Authorizing Provider  baclofen  (LIORESAL ) 10 MG tablet Take 10 mg by mouth 3 (three) times daily.   Yes [provider]  diazePAM , 15 MG Dose, (VALTOCO  15 MG DOSE) 2 x 7.5 MG/0.1ML LQPK Spray 7.5 mg into each nostril in the event of a seizure 04/20/22  Yes Etter Hermann., MD  gabapentin  (NEURONTIN ) 400 MG capsule Take 1 capsule (400 mg total) by mouth 3 (three) times daily. 04/20/22 11/20/23 Yes Etter Hermann., MD  lacosamide  100 MG TABS Take 1 tablet (100 mg total) by mouth 2 (two) times daily. 04/20/22 11/20/23 Yes Etter Hermann., MD  melatonin 5 MG TABS Take 10 mg by mouth  at bedtime.   Yes [provider]  QUEtiapine  (SEROQUEL  XR) 200 MG 24 hr tablet Take 200 mg by mouth in the morning.   Yes [provider]  QUEtiapine  (SEROQUEL ) 25 MG tablet Take 25 mg by mouth at bedtime.   Yes [provider]  valproic  acid (DEPAKENE ) 250 MG capsule Take 4 capsules (1,000 mg total) by mouth 2 (two) times daily. Patient taking differently: Take 500 mg by mouth every morning. 04/20/22 11/20/23 Yes Etter Hermann., MD  valproic  acid (DEPAKENE ) 250 MG capsule Take 750 mg by mouth every evening.   Yes [provider]     Critical care time: 40 minutes     Janey Meek, AGNP  Pulmonary/Critical Care Pager [phone] (please enter 7 digits) PCCM Consult Pager [phone] (please enter 7 digits)
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
## 2023-12-03 NOTE — Plan of Care (Signed)
  Problem: Fluid Volume: Goal: Hemodynamic stability will improve Outcome: Progressing   Problem: Clinical Measurements: Goal: Diagnostic test results will improve Outcome: Progressing Goal: Signs and symptoms of infection will decrease Outcome: Progressing   Problem: Respiratory: Goal: Ability to maintain adequate ventilation will improve Outcome: Progressing   Problem: Activity: Goal: Ability to tolerate increased activity will improve Outcome: Progressing   Problem: Respiratory: Goal: Ability to maintain a clear airway and adequate ventilation will improve Outcome: Progressing   Problem: Role Relationship: Goal: Method of communication will improve Outcome: Progressing   Problem: Activity: Goal: Ability to tolerate increased activity will improve Outcome: Progressing   Problem: Respiratory: Goal: Ability to maintain a clear airway and adequate ventilation will improve Outcome: Progressing   Problem: Role Relationship: Goal: Method of communication will improve Outcome: Progressing   Problem: Education: Goal: Knowledge of General Education information will improve Description: Including pain rating scale, medication(s)/side effects and non-pharmacologic comfort measures Outcome: Progressing   Problem: Health Behavior/Discharge Planning: Goal: Ability to manage health-related needs will improve Outcome: Progressing   Problem: Clinical Measurements: Goal: Ability to maintain clinical measurements within normal limits will improve Outcome: Progressing Goal: Will remain free from infection Outcome: Progressing Goal: Diagnostic test results will improve Outcome: Progressing Goal: Respiratory complications will improve Outcome: Progressing Goal: Cardiovascular complication will be avoided Outcome: Progressing   Problem: Activity: Goal: Risk for activity intolerance will decrease Outcome: Progressing   Problem: Nutrition: Goal: Adequate nutrition will be  maintained Outcome: Progressing   Problem: Coping: Goal: Level of anxiety will decrease Outcome: Progressing   Problem: Elimination: Goal: Will not experience complications related to bowel motility Outcome: Progressing Goal: Will not experience complications related to urinary retention Outcome: Progressing   Problem: Pain Managment: Goal: General experience of comfort will improve and/or be controlled Outcome: Progressing   Problem: Safety: Goal: Ability to remain free from injury will improve Outcome: Progressing   Problem: Skin Integrity: Goal: Risk for impaired skin integrity will decrease Outcome: Progressing

## 2023-12-03 NOTE — Plan of Care (Signed)
  Problem: Fluid Volume: Goal: Hemodynamic stability will improve Outcome: Not Progressing   Problem: Clinical Measurements: Goal: Diagnostic test results will improve Outcome: Not Progressing Goal: Signs and symptoms of infection will decrease Outcome: Not Progressing   Problem: Respiratory: Goal: Ability to maintain adequate ventilation will improve Outcome: Not Progressing   Problem: Activity: Goal: Ability to tolerate increased activity will improve Outcome: Not Progressing   Problem: Respiratory: Goal: Ability to maintain a clear airway and adequate ventilation will improve Outcome: Not Progressing   Problem: Role Relationship: Goal: Method of communication will improve Outcome: Not Progressing   Problem: Education: Goal: Knowledge of General Education information will improve Description: Including pain rating scale, medication(s)/side effects and non-pharmacologic comfort measures Outcome: Not Progressing   Problem: Health Behavior/Discharge Planning: Goal: Ability to manage health-related needs will improve Outcome: Not Progressing   Problem: Clinical Measurements: Goal: Ability to maintain clinical measurements within normal limits will improve Outcome: Not Progressing Goal: Will remain free from infection Outcome: Not Progressing Goal: Diagnostic test results will improve Outcome: Not Progressing Goal: Respiratory complications will improve Outcome: Not Progressing Goal: Cardiovascular complication will be avoided Outcome: Not Progressing   Problem: Activity: Goal: Risk for activity intolerance will decrease Outcome: Not Progressing   Problem: Nutrition: Goal: Adequate nutrition will be maintained Outcome: Not Progressing   Problem: Coping: Goal: Level of anxiety will decrease Outcome: Not Progressing   Problem: Elimination: Goal: Will not experience complications related to bowel motility Outcome: Not Progressing Goal: Will not experience  complications related to urinary retention Outcome: Not Progressing   Problem: Pain Managment: Goal: General experience of comfort will improve and/or be controlled Outcome: Not Progressing   Problem: Safety: Goal: Ability to remain free from injury will improve Outcome: Not Progressing   Problem: Skin Integrity: Goal: Risk for impaired skin integrity will decrease Outcome: Not Progressing   Problem: Activity: Goal: Ability to tolerate increased activity will improve Outcome: Not Progressing   Problem: Respiratory: Goal: Ability to maintain a clear airway and adequate ventilation will improve Outcome: Not Progressing   Problem: Role Relationship: Goal: Method of communication will improve Outcome: Not Progressing

## 2023-12-04 ENCOUNTER — Other Ambulatory Visit: Payer: Self-pay

## 2023-12-04 DIAGNOSIS — R1310 Dysphagia, unspecified: Secondary | ICD-10-CM | POA: Diagnosis not present

## 2023-12-04 DIAGNOSIS — J9601 Acute respiratory failure with hypoxia: Secondary | ICD-10-CM | POA: Diagnosis not present

## 2023-12-04 DIAGNOSIS — J189 Pneumonia, unspecified organism: Secondary | ICD-10-CM | POA: Diagnosis not present

## 2023-12-04 LAB — BASIC METABOLIC PANEL
Anion gap: 11 (ref 5–15)
BUN: 10 mg/dL (ref 6–20)
CO2: 26 mmol/L (ref 22–32)
Calcium: 7.8 mg/dL — ABNORMAL LOW (ref 8.9–10.3)
Chloride: 100 mmol/L (ref 98–111)
Creatinine, Ser: 0.58 mg/dL — ABNORMAL LOW (ref 0.61–1.24)
GFR, Estimated: >60 mL/min (ref >60)
Glucose, Bld: 113 mg/dL — ABNORMAL HIGH (ref 70–99)
Potassium: 3.5 mmol/L (ref 3.5–5.1)
Sodium: 137 mmol/L (ref 135–145)

## 2023-12-04 LAB — PHOSPHORUS: Phosphorus: 4.8 mg/dL — ABNORMAL HIGH (ref 2.5–4.6)

## 2023-12-04 LAB — CBC
HCT: 28.6 % — ABNORMAL LOW (ref 39.0–52.0)
Hemoglobin: 9.4 g/dL — ABNORMAL LOW (ref 13.0–17.0)
MCH: 31.1 pg (ref 26.0–34.0)
MCHC: 32.9 g/dL (ref 30.0–36.0)
MCV: 94.7 fL (ref 80.0–100.0)
Platelets: 356 10*3/uL (ref 150–400)
RBC: 3.02 MIL/uL — ABNORMAL LOW (ref 4.22–5.81)
RDW: 14 % (ref 11.5–15.5)
WBC: 5.6 10*3/uL (ref 4.0–10.5)
nRBC: 0 % (ref 0.0–0.2)

## 2023-12-04 LAB — GLUCOSE, CAPILLARY
Glucose-Capillary: 109 mg/dL — ABNORMAL HIGH (ref 70–99)
Glucose-Capillary: 98 mg/dL (ref 70–99)
Glucose-Capillary: 98 mg/dL (ref 70–99)
Glucose-Capillary: 119 mg/dL — ABNORMAL HIGH (ref 70–99)
Glucose-Capillary: 90 mg/dL (ref 70–99)
Glucose-Capillary: 129 mg/dL — ABNORMAL HIGH (ref 70–99)

## 2023-12-04 LAB — MAGNESIUM: Magnesium: 2.2 mg/dL (ref 1.7–2.4)

## 2023-12-04 MED ORDER — DOCUSATE SODIUM 100 MG PO CAPS: 100.0000 mg | ORAL_CAPSULE | Freq: Every day | ORAL | Status: DC

## 2023-12-04 MED ORDER — CEFAZOLIN SODIUM-DEXTROSE 2-4 GM/100ML-% IV SOLN
2.0000 g | INTRAVENOUS | Status: AC
  Administered 2023-12-05: 2 g via INTRAVENOUS
  Filled 2023-12-04 (×2): qty 100

## 2023-12-04 MED ORDER — POLYETHYLENE GLYCOL 3350 17 G PO PACK
17.0000 g | PACK | Freq: Every day | ORAL | Status: DC
  Administered 2023-12-04 – 2023-12-09 (×4): 17 g
  Filled 2023-12-04 (×4): qty 1

## 2023-12-04 MED ORDER — SENNA 8.6 MG PO TABS
1.0000 | ORAL_TABLET | Freq: Every day | ORAL | Status: DC
  Administered 2023-12-04 – 2023-12-13 (×8): 8.6 mg
  Filled 2023-12-04 (×8): qty 1

## 2023-12-04 NOTE — Progress Notes (Signed)
PICC order received. Unable to get PICC consent, primary RN aware.

## 2023-12-04 NOTE — Progress Notes (Addendum)
Order for bedside PICC placement by [facility]. Spoke with mother about procedure including risks, benefits, and alternatives. Give the alternatives, the mother stated she would rather have radiology place the line. Primary RN notified.

## 2023-12-04 NOTE — Plan of Care (Signed)
  Problem: Fluid Volume: Goal: Hemodynamic stability will improve Outcome: Progressing   Problem: Clinical Measurements: Goal: Diagnostic test results will improve Outcome: Progressing Goal: Signs and symptoms of infection will decrease Outcome: Progressing   Problem: Respiratory: Goal: Ability to maintain adequate ventilation will improve Outcome: Progressing   Problem: Activity: Goal: Ability to tolerate increased activity will improve Outcome: Progressing   Problem: Respiratory: Goal: Ability to maintain a clear airway and adequate ventilation will improve Outcome: Progressing   Problem: Role Relationship: Goal: Method of communication will improve Outcome: Progressing   Problem: Activity: Goal: Ability to tolerate increased activity will improve Outcome: Progressing   Problem: Respiratory: Goal: Ability to maintain a clear airway and adequate ventilation will improve Outcome: Progressing   Problem: Role Relationship: Goal: Method of communication will improve Outcome: Progressing   Problem: Education: Goal: Knowledge of General Education information will improve Description: Including pain rating scale, medication(s)/side effects and non-pharmacologic comfort measures Outcome: Progressing   Problem: Health Behavior/Discharge Planning: Goal: Ability to manage health-related needs will improve Outcome: Progressing   Problem: Clinical Measurements: Goal: Ability to maintain clinical measurements within normal limits will improve Outcome: Progressing Goal: Will remain free from infection Outcome: Progressing Goal: Diagnostic test results will improve Outcome: Progressing Goal: Respiratory complications will improve Outcome: Progressing Goal: Cardiovascular complication will be avoided Outcome: Progressing   Problem: Activity: Goal: Risk for activity intolerance will decrease Outcome: Progressing   Problem: Nutrition: Goal: Adequate nutrition will be  maintained Outcome: Progressing   Problem: Coping: Goal: Level of anxiety will decrease Outcome: Progressing   Problem: Elimination: Goal: Will not experience complications related to bowel motility Outcome: Progressing Goal: Will not experience complications related to urinary retention Outcome: Progressing   Problem: Pain Managment: Goal: General experience of comfort will improve and/or be controlled Outcome: Progressing   Problem: Safety: Goal: Ability to remain free from injury will improve Outcome: Progressing   Problem: Skin Integrity: Goal: Risk for impaired skin integrity will decrease Outcome: Progressing

## 2023-12-04 NOTE — Consult Note (Signed)
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 recurrent respiratory failure and hypotension. Patient is currently intubated on levophed. IR consulted for PEG tube placement for nutritional access. Case and imaging reviewed and approved by Dr. Mosie Epstein.  -Hold tube feeds at midnight prior to procedure -Hold nightly ppx Lovenox night before procedure -Plan for morning labs  -2g Ancef prior to procedure IR Image Guided Gastrostomy Tube Placement  Risks and benefits image guided gastrostomy tube placement was discussed with the patient including, but not limited to the need for a barium enema during the procedure, bleeding, infection, peritonitis and/or damage to adjacent structures.  All of the mother's questions were answered, she is agreeable to proceed.  Consent signed and in chart.   Thank you for this interesting consult.  I greatly enjoyed meeting Zafir W Seelinger [name] and look forward to participating in their care.  A copy of this report  was sent to the requesting provider on this date.  Electronically Signed: Jama Flavors, PA-C 12/04/2023, 10:19 AM    I spent a total of 40 Minutes in face to face in clinical consultation, greater than 50% of which was counseling/coordinating care for gastrostomy tube placement.

## 2023-12-04 NOTE — Consult Note (Signed)
Inpatient Consult Note  Attending:   Magnus Ivan. Okey Dupre, MD, MBA, FARS   Otolaryngology-Head & Neck Surgery  This patient was seen today in at the request of No referring provider defined for this encounter. 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 old child who presents today with complaint of  Chief Complaint  Patient presents with   Altered Mental Status    28 yo gentleman with TBI and seizure disorder s/p prior trach at [facility] s/p decannulation.  Now reintubated with ventilatory dependence and failure to wean / extubate.  ICU team requesting trach replacement and PEG tube.     REVIEW OF SYSTEMS: The patient / family denies any recent history of fever, night sweats or weight loss, pain, cyanosis, clubbing or edema, respiratory distress, dizziness or imbalance.   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   Seizure disorder (HCC)    TBI (traumatic brain injury) (HCC)       SURGICAL HISTORY: Past Surgical History:  Procedure Laterality Date   GASTROSTOMY TUBE PLACEMENT       MEDICATIONS:  Current Facility-Administered Medications:    acetaminophen (TYLENOL) tablet 650 mg, 650 mg, Per Tube, Q6H PRN, 650 mg at 12/01/23 0855 **OR** acetaminophen (TYLENOL) suppository 650 mg, 650 mg, Rectal, Q6H PRN, Rust-Chester, Britton L, NP, 650 mg at 11/21/23 0230   baclofen (LIORESAL) tablet 10 mg, 10 mg, Per Tube, TID, Rust-Chester, Micheline Rough L, NP, 10 mg at 12/04/23 1536   [START ON 12/05/2023] ceFAZolin (ANCEF) IVPB 2g/100 mL premix, 2 g, Intravenous, to XRAY, McInnis, Caitlyn M, PA-C   Chlorhexidine Gluconate Cloth 2 % PADS 6 each, 6 each, Topical, Nightly, Aleskerov, Fuad, MD, 6 each at 12/04/23  1012   enoxaparin (LOVENOX) injection 40 mg, 40 mg, Subcutaneous, Q24H, Mansy, Jan A, MD, 40 mg at 12/03/23 2334   feeding supplement (OSMOLITE 1.5 CAL) liquid 1,000 mL, 1,000 mL, Per Tube, Q24H, Assaker, Jean-Pierre, MD, Infusion Verify at 12/04/23 1100   feeding supplement (PROSource TF20) liquid 60 mL, 60 mL, Per Tube, Daily, Dgayli, Khabib, MD, 60 mL at 12/04/23 1012   free water 100 mL, 100 mL, Per Tube, Q4H, Dgayli, Khabib, MD, 100 mL at 12/04/23 1200   HYDROmorphone (DILAUDID) 50 mg in 50 mL NS (1mg /mL) premix infusion, 0.5-4 mg/hr, Intravenous, Continuous, Dgayli, Khabib, MD, Last Rate: 1 mL/hr at 12/04/23 1536, 1 mg/hr at 12/04/23 1536   HYDROmorphone (DILAUDID) bolus via infusion 0.25-2 mg, 0.25-2 mg, Intravenous, Q30 min PRN, Dgayli, Khabib, MD, 0.5 mg at 12/02/23 1449   ipratropium-albuterol (DUONEB) 0.5-2.5 (3) MG/3ML nebulizer solution 3 mL, 3 mL, Nebulization, Q6H, Zhang, Dekui, MD, 3 mL at 12/04/23 1323   lacosamide (VIMPAT) tablet 100 mg, 100 mg, Per Tube, BID, Lowella Bandy, RPH, 100 mg at 12/04/23 1044   linezolid (ZYVOX) IVPB 600 mg, 600 mg, Intravenous, Q12H, Ezequiel Essex, NP, Last Rate: 300 mL/hr at 12/04/23 1100, Infusion Verify at 12/04/23 1100   lip balm (BLISTEX) ointment, , Topical, PRN, Jimmye Norman, NP, Given at 11/24/23 1932   norepinephrine (LEVOPHED) 4mg  in (0.016 mg/mL) premix infusion, 0-40 mcg/min, Intravenous, Titrated, Ouma, Hubbard Hartshorn, NP, Last Rate: 11.25 mL/hr at 12/04/23 1100,  3 mcg/min at 12/04/23 1100   nutrition supplement (JUVEN) (JUVEN) powder packet 1 packet, 1 packet, Per Tube, BID BM, Dgayli, Lianne Bushy, MD, 1 packet at 12/04/23 1407   ondansetron (ZOFRAN) tablet 4 mg, 4 mg, Per Tube, Q6H PRN **OR** ondansetron (ZOFRAN) injection 4 mg, 4 mg, Intravenous, Q6H PRN, Rust-Chester, Cecelia Byars, NP   Oral care mouth rinse, 15 mL, Mouth Rinse, PRN, Vida Rigger, MD   Oral care mouth rinse, 15 mL, Mouth Rinse, Q2H, Dgayli, Khabib, MD,  15 mL at 12/04/23 1407   Oral care mouth rinse, 15 mL, Mouth Rinse, PRN, Dgayli, Khabib, MD   pantoprazole (PROTONIX) injection 40 mg, 40 mg, Intravenous, Q12H, Rust-Chester, Britton L, NP, 40 mg at 12/04/23 1011   piperacillin-tazobactam (ZOSYN) IVPB 3.375 g, 3.375 g, Intravenous, Q8H, Ezequiel Essex, NP, Last Rate: 12.5 mL/hr at 12/04/23 1407, 3.375 g at 12/04/23 1407   polyethylene glycol (MIRALAX / GLYCOLAX) packet 17 g, 17 g, Per Tube, Daily, Assaker, [place] Bali, MD, 17 g at 12/04/23 1225   propofol (DIPRIVAN) 1000 MG/100ML infusion, 5-80 mcg/kg/min, Intravenous, Titrated, Dgayli, Khabib, MD, Last Rate: 9.51 mL/hr at 12/04/23 1100, 25 mcg/kg/min at 12/04/23 1100   QUEtiapine (SEROQUEL) tablet 50 mg, 50 mg, Per Tube, BID, Rust-Chester, Micheline Rough L, NP, 50 mg at 12/04/23 1011   senna (SENOKOT) tablet 8.6 mg, 1 tablet, Per Tube, Daily, Assaker, [place] Bali, MD, 8.6 mg at 12/04/23 1225   sodium chloride flush (NS) 0.9 % injection 10-40 mL, 10-40 mL, Intracatheter, Q12H, Karna Christmas, Fuad, MD, 10 mL at 12/04/23 1012   sodium chloride flush (NS) 0.9 % injection 10-40 mL, 10-40 mL, Intracatheter, PRN, Vida Rigger, MD   valproic acid (DEPAKENE) 250 MG/5ML solution 500 mg, 500 mg, Per Tube, Daily, Lowella Bandy, RPH, 500 mg at 12/04/23 1012   valproic acid (DEPAKENE) 250 MG/5ML solution 750 mg, 750 mg, Per Tube, QHS, Lowella Bandy, RPH, 750 mg at 12/03/23 2147   ALLERGIES: No Known Allergies   BIRTH HX:  Term - no complications.   SOCIAL HISTORY: Social History   Socioeconomic History   Marital status: Single    Spouse name: Not on file   Number of children: Not on file   Years of education: Not on file   Highest education level: Not on file  Occupational History   Not on file  Tobacco Use   Smoking status: Never   Smokeless tobacco: Never  Substance and Sexual Activity   Alcohol use: Not Currently   Drug use: Never   Sexual activity: Not Currently  Other Topics Concern   Not  on file  Social History Narrative   Not on file   Social Drivers of Health   Financial Resource Strain: Low Risk  (12/02/2021)   Received from [facility]   Overall Financial Resource Strain (CARDIA)    Difficulty of Paying Living Expenses: Not very hard  Food Insecurity: Patient Unable To Answer (11/20/2023)   Hunger Vital Sign    Worried About Running Out of Food in the Last Year: Patient unable to answer    Ran Out of Food in the Last Year: Patient unable to answer  Transportation Needs: Patient Unable To Answer (11/20/2023)   PRAPARE - Transportation    Lack of Transportation (Medical): Patient unable to answer    Lack of Transportation (Non-Medical): Patient unable to answer  Physical Activity: Not on file  Stress: Not on file  Social Connections: Not on file  Intimate Partner Violence: Patient Unable To Answer (  11/20/2023)   Humiliation, Afraid, Rape, and Kick questionnaire    Fear of Current or Ex-Partner: Patient unable to answer    Emotionally Abused: Patient unable to answer    Physically Abused: Patient unable to answer    Sexually Abused: Patient unable to answer     FAMILY HISTORY: History reviewed. No pertinent family history.   PHYSICAL EXAM: BP (!) 86/54   Pulse (!) 106   Temp 98 F (36.7 C) (Oral)   Resp 20   Ht 5\' 9"  (1.753 m)   Wt 63.1 kg   SpO2 100%   BMI 20.54 kg/m    The patient is intubated - no accute distress.    There was no respiratory distress, stridor or retractions.    Skin exam of the scalp, face and neck appeared normal.  The ear exam revealed patent external auditory canals and tympanic membranes to be clear bilaterally without perforation, drainage, middle ear fluid or evidence of acute infection.    The nose was patent bilaterally with no purulent drainage, polyps or mass lesions.  The nasal septum appeared midline.  Oral cavity exam revealed no mass or mucosal lesions, erythema or exudate.    The neck was nontender, without  palpable adenopathy or mass lesion.  Thin neck with evidence of healed prior trach site.  Landmarks palpable.  Neurologic exam revealed cranial nerves II-XII to be grossly intact and symmetic, including the 7th cranial nerve, which was intact and symmetric bilaterally.   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  Anticipate return to the ICU postop as well as tracheostomy tube change by ENT on POD 5.    I discussed the risks, benefits and options for the proposed procedure with the family (Mom) at length today - she understands and agree to proceed.    I discussed with her as well, that we'd be quite careful and perhaps avoid decannulation this time as the trach tube was likely helping and we wouldn't want to remove and replace again going forward if at all possible.    PICU team to hold Lovenox and check coags preop.  They will also hold any tube feeds after MN prior to surgery.  Consult signed, witnessed and on chart.  Greatly appreciate their assistance.    Magnus Ivan. Okey Dupre, MD, MBA, [facility] Otolaryngology-Head & Neck Surgery  [facility] [phone]

## 2023-12-04 NOTE — Progress Notes (Signed)
NAME:  Alex [name], MRN:  [id number], DOB:  05/19/1996, LOS: 15 ADMISSION DATE:  11/19/2023, CONSULTATION DATE:  11/20/23 REFERRING MD:  Dr. Arville Care, CHIEF COMPLAINT:  AMS   Brief Pt Description / Synopsis:  28 y.o. male with PMHx significant for chronic TBI with left sided hemiparesis admitted with Acute Metabolic Encephalopathy and Acute Hypoxic Respiratory Failure in the setting of aspiration and Pseudomonas and MRSA Pneumonia requiring intubation and mechanical ventilation.   History of Present Illness:  28 yo M presenting to [facility] ED from outpatient rehab on 11/19/23 for evaluation of altered mental status.   History obtained per chart review and mother's telephone interview, patient unable to participate in interview due to respiratory distress and baseline TBI. 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 He was treated at [facility] for 5 months then discharged to rehab. Per his mother and legal guardian his baseline is: Non verbal but he will yell out sporadic nonsensical words with left sided paralysis. He is able to move his RUE in order to feed himself with finger foods and he has some involuntary movement with that arm, he will swat at you. Similar baseline function with RLE, he can kick and move, but often will lay it bent and to the side. He has enough strength to even try and get out of bed on the right side. She denies any issues with swallowing, but eats mostly soft food. If he is not watched closely with eating he will try to eat all the food at once. The facility staff reported he was at his normal baseline until lunchtime on 11/19/23. It was observed he was more somnolent and not interactive. Staff noted a cough and slightly increased work of breathing. Mom also confirmed noting a congested cough the last time she visited earlier in the week. Staff and mom denied nausea/ vomiting, but mom reports chronic loose stools.   EMS reported the patient febrile and  tachycardic on arrival.   ED course: Upon arrival patient tachycardic and lethargic. Sepsis protocol initiated with antibiotics and IVF resuscitation. Labs significant for mild hypokalemia, otherwise WNL. Initial imaging unremarkable but then patient became hypoxic with SpO2 85% on RA and a CTa was obtained, negative for PE but concerning for aspiration pneumonia. TRH consulted for admission. While patient was pending admission in ED he became increasingly hypoxic in the 60's, placed on a NRB without improvement. PCCM alerted the need to emergently intubate the patient. Medications given: Cefepime, Flagyl, Vancomycin, 2 L IVF bolus, IV contrast Initial Vitals: 97.9, 17, 130, 119/82 & 92 on RA Significant labs: (Labs/ Imaging personally reviewed) EKG : pending Chemistry: Na+: 138, K+: 3.4, BUN/Cr.: 13/ 0.65, Serum CO2/ AG: 26/8 Hematology: WBC: 4.3, Hgb: 14.3,  Lactic/ PCT: 1.1 > 1.4 / pending, COVID-19 & Influenza A/B: negative   ABG: pending post intubation   CXR 11/19/23: no active disease CT head wo contrast 11/19/23: No evidence of acute intracranial abnormality. Stable multifocal encephalomalacia, likely the sequela of prior trauma. Similar age-advanced cerebral atrophy CT angio chest PE 11/19/23:  No pulmonary embolus. Near complete filling of the right mainstem bronchus with filling extending into the bronchus intermedius, right upper, middle and lower lobe bronchi. Layering debris in the trachea. Findings are highly suspicious for aspiration. Minimal ill-defined patchy and nodular airspace disease in the dependent right lower lobe, likely postobstructive pneumonia.   PCCM consulted for assistance in management and monitoring due to acute hypoxic respiratory failure requiring urgent  intubation and mechanical ventilatory support secondary to suspected aspiration and pneumonia.  Please see "Significant Hospital Events" section below for full detailed hospital course.   Pertinent  Medical  History  Seizures TBI (09/2019)  Micro Data:  1/27: SARS-CoV-2/flu/RSV PCR>> negative 1/27: Blood culture x 2>> no growth 1/28: MRSA PCR>>Positive  1/28: Tracheal aspirate>>Pseudomonas aeruginosa and Staphylococcus aureus 1/28: HIV screen>> nonreactive 1/29: BAL>>Pseudomonas aeruginosa & MRSA 2/07: Cdiff>>negative   Antimicrobials:   Anti-infectives (From admission, onward)    Start     Dose/Rate Route Frequency Ordered Stop   12/01/23 2200  vancomycin (VANCOREADY) IVPB 750 mg/150 mL  Status:  Discontinued        750 mg 150 mL/hr over 60 Minutes Intravenous Every 8 hours 12/01/23 1205 12/01/23 1215   12/01/23 1400  vancomycin (VANCOREADY) IVPB 1250 mg/250 mL  Status:  Discontinued        1,250 mg 166.7 mL/hr over 90 Minutes Intravenous  Once 12/01/23 1205 12/01/23 1215   12/01/23 1315  linezolid (ZYVOX) IVPB 600 mg        600 mg 300 mL/hr over 60 Minutes Intravenous Every 12 hours 12/01/23 1215     12/01/23 0230  piperacillin-tazobactam (ZOSYN) IVPB 3.375 g        3.375 g 12.5 mL/hr over 240 Minutes Intravenous Every 8 hours 12/01/23 0131     11/30/23 1500  levofloxacin (LEVAQUIN) IVPB 750 mg  Status:  Discontinued        750 mg 100 mL/hr over 90 Minutes Intravenous Every 24 hours 11/30/23 0345 12/01/23 0048   11/28/23 0100  vancomycin (VANCOREADY) IVPB 1250 mg/250 mL       Placed in "Followed by" Linked Group   1,250 mg 166.7 mL/hr over 90 Minutes Intravenous Every 12 hours 11/27/23 1153 11/29/23 1331   11/27/23 1300  vancomycin (VANCOREADY) IVPB 1500 mg/300 mL       Placed in "Followed by" Linked Group   1,500 mg 150 mL/hr over 120 Minutes Intravenous  Once 11/27/23 1153 11/27/23 1516   11/23/23 1500  levofloxacin (LEVAQUIN) IVPB 750 mg        750 mg 100 mL/hr over 90 Minutes Intravenous Every 24 hours 11/23/23 1341 11/29/23 1551   11/21/23 2200  doxycycline (VIBRAMYCIN) 100 mg in sodium chloride 0.9 % 250 mL IVPB  Status:  Discontinued        100 mg 125 mL/hr over 120  Minutes Intravenous Every 12 hours 11/21/23 1425 11/27/23 1148   11/21/23 2000  metroNIDAZOLE (FLAGYL) IVPB 500 mg  Status:  Discontinued        500 mg 100 mL/hr over 60 Minutes Intravenous Every 12 hours 11/21/23 1425 11/24/23 1227   11/20/23 1200  Ampicillin-Sulbactam (UNASYN) 3 g in sodium chloride 0.9 % 100 mL IVPB  Status:  Discontinued        3 g 200 mL/hr over 30 Minutes Intravenous Every 6 hours 11/20/23 1131 11/21/23 1423   11/20/23 0800  cefTRIAXone (ROCEPHIN) 2 g in sodium chloride 0.9 % 100 mL IVPB  Status:  Discontinued        2 g 200 mL/hr over 30 Minutes Intravenous Every 24 hours 11/19/23 2322 11/20/23 0229   11/20/23 0100  azithromycin (ZITHROMAX) 500 mg in sodium chloride 0.9 % 250 mL IVPB  Status:  Discontinued        500 mg 250 mL/hr over 60 Minutes Intravenous Every 24 hours 11/19/23 2322 11/21/23 1423   11/19/23 1545  ceFEPIme (MAXIPIME) 2 g in sodium chloride 0.9 %  100 mL IVPB        2 g 200 mL/hr over 30 Minutes Intravenous  Once 11/19/23 1541 11/19/23 1630   11/19/23 1545  metroNIDAZOLE (FLAGYL) IVPB 500 mg        500 mg 100 mL/hr over 60 Minutes Intravenous  Once 11/19/23 1541 11/19/23 1748   11/19/23 1545  vancomycin (VANCOCIN) IVPB 1000 mg/200 mL premix        1,000 mg 200 mL/hr over 60 Minutes Intravenous  Once 11/19/23 1541 11/19/23 1917      Significant Hospital Events: Including procedures, antibiotic start and stop dates in addition to other pertinent events   11/20/23: Admit to ICU due to acute hypoxic respiratory failure requiring urgent intubation and mechanical ventilatory support secondary to suspected aspiration and pneumonia 11/21/23- patient moving RUE, mother at bedside we reviewed medical plan. He remains on 57mcg/kg/hr levophed, today plan to rescusitate more aggresively and wean from levophed and potentially extubate post SBP.   He is febrile this am.  He is on zithromax, unasyn 11/22/23- s/p bronch yesterday with aspiration of mucus plugging.   Today resp status improved with liberation protocol in proces. SLP post extubation today. 11/23/23- patient is +for pseudomonas resp cultures.  He is also MRSA pcr +, refined therapy during rounds with pharmacist. He remains on MV 11/24/23- patient is still on vasopressor support weaning down on MV.  Secretions are slightly better.  11/25/23- patient weaned off levophed, failed SBT with tachypnea, tachycardia.  Secretions much improved. 11/26/23- on minimal vent support, unable to perform SBT due to copious secretions from ETT.   11/27/23- on minimal vent support, secretions improved.  Change Doxycycline to Vancomycin.  Plan for SBT as tolerated. 11/28/23-Pt successfully extubated 02/4, currently tolerating HHFNC @35L /35%.  Required low dose precedex overnight to allow suctioning due to excessive secretions. Pt with suspected seizure activity developed tremors in the right upper extremity and became minimally responsive.  Received 1g of iv keppra.    11/29/23-Overnight pt developed sinus tachycardia/svt 140 to 160's improved following 2.5 mg iv metoprolol.  Tolerating RA with no signs of respiratory distress.  Transferring to progressive care unit TRH to pick on 02/7 12/01/23: Pt transferred back to ICU with severe acute hypoxic respiratory failure initially placed on HHFNC but due to significant hypoxia O2 sats in the 60's he required reintubation and underwent emergent bronchoscopy which revealed thick secretions in the LUL and LLL resulting in mucous plugging. Therapeutic aspiration performed. CXR showed collapse of the LUL secondary to mucus plugging  12/03/23: Pt remains mechanically intubated on minimal vent settings.  ENT consulted for tracheostomy placement due to recurrent respiratory failure due to inability to clear secretions.  Requiring levophed gtt to maintain map 65 or higher  12/04/23: No acute events overnight on minimal settings pending tracheostomy placement   Interim History / Subjective:  As  outlined above under significant events   Objective   Blood pressure 101/62, pulse 76, temperature 97.9 F (36.6 C), temperature source Axillary, resp. rate 20, height 5\' 9"  (1.753 m), weight 63.1 kg, SpO2 100%.    Vent Mode: PRVC FiO2 (%):  [28 %] 28 % Set Rate:  [20 bmp] 20 bmp Vt Set:  [450 mL] 450 mL PEEP:  [8 cmH20] 8 cmH20 Plateau Pressure:  [19 cmH20-21 cmH20] 21 cmH20   Intake/Output Summary (Last 24 hours) at 12/04/2023 0927 Last data filed at 12/04/2023 0545 Gross per 24 hour  Intake 1989.14 ml  Output 1840 ml  Net 149.14 ml  Filed Weights   12/02/23 0312 12/03/23 0410 12/04/23 0400  Weight: 63.6 kg 61.4 kg 63.1 kg    Examination: General: Acute on chronically-ill appearing frail male, NAD mechanically intubated   HENT: Atraumatic, normocephalic, neck supple, no JVD Lungs: Faint rhonchi throughout, even, non labored  Cardiovascular: Sinus tachycardia, s1s2, no m/r/g, 2+ radial/2+ distal pulses, trace generalized edema  Abdomen: +BS x4, soft, non distended  Extremities: Left upper and lower extremities contractured, bilateral foot drop Neuro: Left-sided hemiparesis with left arm contractured (baseline), opens eyes to voice and tracks GU: Indwelling foley catheter in place draining yellow urine pending removal   Resolved Hospital Problem list   Mechanical intubation   Assessment & Plan:   #Sinus tachycardia/SVT  #Sepsis  #Hypotension secondary to sedating medication and possible recurrent sepsis  - Continuous telemetry monitoring  - IV fluid resuscitation and prn levophed gtt to maintain map 65 or higher   #Acute hypoxic respiratory failure secondary to aspiration, LUL/LLL mucous plugging, and collapse of the LUL due to mucous plugging in the setting of TBI s/p emergent bronchoscopy 02/8 #Mechanical ventilation  - Full vent support for now: vent settings reviewed and established  - Continue lung protective strategies  - Maintain plateau pressures less than 30  cm H2O - Maintain O2 sats 92% or higher  - Prn bronchodilator therapy  - Aggressive pulmonary hygiene - ENT consulted for tracheostomy placement   #Pseudomonas & MRSA pneumonia #Aspiration pneumonia  - Trend WBC and monitor fever curve  - Continue zosyn and linezolid with completion date 12/07/23 per ID recommendations  - ID consulted appreciate input   #Chronic Seizure Disorder #Chronic TBI EEG 02/10: obtained while sedated on propofol and comatose and is abnormal due to severe diffuse slowing indicative of global cerebral dysfunction, medication effect, or both. Epileptiform abnormalities were not seen during this recording.  - Continue outpatient vimpat and valproic acid  - Continue outpatient baclofen  - Continue seroquel  - Seizure precautions   #Dysphagia - NGT for TF's pt pending gastrostomy tube placement per IR   Best Practice (right click and "Reselect all SmartList Selections" daily)   Diet/type: NPO, tube feeds DVT prophylaxis: LMWH GI prophylaxis: PPI Lines: right internal jugular central line, and is still needed Foley: Will discontinue today  Code Status:  full code Last date of multidisciplinary goals of care discussion [12/04/23]  2/11: Updated pts mother via telephone regarding pts condition and current plan of care.  All questions were answered  Labs   CBC: Recent Labs  Lab 11/30/23 0600 12/01/23 0555 12/02/23 0458 12/03/23 0431 12/04/23 0425  WBC 15.8* 13.2* 16.2* 8.7 5.6  HGB 12.4* 12.2* 10.9* 10.6* 9.4*  HCT 37.0* 36.9* 33.1* 33.4* 28.6*  MCV 92.3 95.1 95.9 98.2 94.7  PLT 577* 452* 536* 417* 356    Basic Metabolic Panel: Recent Labs  Lab 11/30/23 0600 12/01/23 0555 12/02/23 0458 12/03/23 0431 12/04/23 0425  NA 140 140 139 138 137  K 3.9 3.5 3.1* 4.7 3.5  CL 105 104 100 100 100  CO2 26 25 25 26 26   GLUCOSE 113* 127* 188* 101* 113*  BUN <5* 9 16 11 10   CREATININE 0.51* 0.54* 0.56* 0.59* 0.58*  CALCIUM 8.6* 8.8* 8.0* 8.4* 7.8*  MG  2.0 2.0 1.7 2.6* 2.2  PHOS 3.3 3.3 4.7* 4.7* 4.8*   GFR: Estimated Creatinine Clearance: 122.7 mL/min (A) (by C-G formula based on SCr of 0.58 mg/dL (L)). Recent Labs  Lab 11/30/23 0600 12/01/23 0555 12/01/23 1052 12/01/23 1414  12/01/23 1735 12/02/23 0458 12/03/23 0431 12/04/23 0425  PROCALCITON <0.10 <0.10  --   --   --   --   --   --   WBC 15.8* 13.2*  --   --   --  16.2* 8.7 5.6  LATICACIDVEN  --   --  3.2* 2.2* 1.9  --   --   --     Liver Function Tests: Recent Labs  Lab 12/02/23 0458 12/03/23 0431  ALBUMIN 2.5* 2.6*    No results for input(s): "LIPASE", "AMYLASE" in the last 168 hours. No results for input(s): "AMMONIA" in the last 168 hours.  ABG    Component Value Date/Time   PHART 7.44 12/01/2023 1143   PCO2ART 34 12/01/2023 1143   PO2ART 63 (L) 12/01/2023 1143   HCO3 23.1 12/01/2023 1143   TCO2 28 05/31/2021 0404   ACIDBASEDEF 0.5 12/01/2023 1143   O2SAT 93 12/01/2023 1143     Coagulation Profile: Recent Labs  Lab 12/03/23 1018  INR 1.0     Cardiac Enzymes: No results for input(s): "CKTOTAL", "CKMB", "CKMBINDEX", "TROPONINI" in the last 168 hours.  HbA1C: Hgb A1c MFr Bld  Date/Time Value Ref Range Status  04/11/2022 06:10 AM 5.1 4.8 - 5.6 % Final    Comment:    (NOTE) Pre diabetes:          5.7%-6.4%  Diabetes:              >6.4%  Glycemic control for   <7.0% adults with diabetes   05/31/2021 04:18 PM 4.9 4.8 - 5.6 % Final    Comment:    (NOTE) Pre diabetes:          5.7%-6.4%  Diabetes:              >6.4%  Glycemic control for   <7.0% adults with diabetes     CBG: Recent Labs  Lab 12/03/23 1617 12/03/23 1935 12/03/23 2314 12/04/23 0419 12/04/23 0751  GLUCAP 106* 103* 117* 109* 98    Review of Systems:   Unable to assess pt nonverbal   Past Medical History:  He,  has a past medical history of 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   Surgical History:   Past Surgical History:  Procedure Laterality Date   GASTROSTOMY TUBE PLACEMENT       Social History:   reports that he has never smoked. He has never used smokeless tobacco. He reports that he does not currently use alcohol. He reports that he does not use drugs.   Family History:  His family history is not on file.   Allergies No Known Allergies   Home Medications  Prior to Admission medications   Medication Sig Start Date End Date Taking? Authorizing Provider  baclofen (LIORESAL) 10 MG tablet Take 10 mg by mouth 3 (three) times daily.   Yes [provider]  diazePAM, 15 MG Dose, (VALTOCO 15 MG DOSE) 2 x 7.5 MG/0.1ML LQPK Spray 7.5 mg into each nostril in the event of a seizure 04/20/22  Yes Zigmund Daniel., MD  gabapentin (NEURONTIN) 400 MG capsule Take 1 capsule (400 mg total) by mouth 3 (three) times daily. 04/20/22 11/20/23 Yes Zigmund Daniel., MD  lacosamide 100 MG TABS Take 1 tablet (100 mg total) by mouth 2 (two) times daily. 04/20/22 11/20/23 Yes Zigmund Daniel., MD  melatonin 5 MG TABS Take 10 mg by mouth  at bedtime.   Yes [provider]  QUEtiapine (SEROQUEL XR) 200 MG 24 hr tablet Take 200 mg by mouth in the morning.   Yes [provider]  QUEtiapine (SEROQUEL) 25 MG tablet Take 25 mg by mouth at bedtime.   Yes [provider]  valproic acid (DEPAKENE) 250 MG capsule Take 4 capsules (1,000 mg total) by mouth 2 (two) times daily. Patient taking differently: Take 500 mg by mouth every morning. 04/20/22 11/20/23 Yes Zigmund Daniel., MD  valproic acid (DEPAKENE) 250 MG capsule Take 750 mg by mouth every evening.   Yes [provider]     Critical care time: 35 minutes     Zada Girt, AGNP  Pulmonary/Critical Care Pager [phone] (please enter 7 digits) PCCM Consult Pager [phone] (please enter 7 digits)
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
## 2023-12-05 ENCOUNTER — Inpatient Hospital Stay: Payer: Medicaid Other | Admitting: Radiology

## 2023-12-05 DIAGNOSIS — J9601 Acute respiratory failure with hypoxia: Secondary | ICD-10-CM | POA: Diagnosis not present

## 2023-12-05 DIAGNOSIS — A419 Sepsis, unspecified organism: Secondary | ICD-10-CM | POA: Diagnosis not present

## 2023-12-05 HISTORY — PX: IR GASTROSTOMY TUBE MOD SED: IMG625

## 2023-12-05 LAB — CBC
HCT: 28.8 % — ABNORMAL LOW (ref 39.0–52.0)
Hemoglobin: 9.4 g/dL — ABNORMAL LOW (ref 13.0–17.0)
MCH: 31.2 pg (ref 26.0–34.0)
MCHC: 32.6 g/dL (ref 30.0–36.0)
MCV: 95.7 fL (ref 80.0–100.0)
Platelets: 347 10*3/uL (ref 150–400)
RBC: 3.01 MIL/uL — ABNORMAL LOW (ref 4.22–5.81)
RDW: 13.5 % (ref 11.5–15.5)
WBC: 4.5 10*3/uL (ref 4.0–10.5)
nRBC: 0 % (ref 0.0–0.2)

## 2023-12-05 LAB — PROTIME-INR
INR: 1 (ref 0.8–1.2)
Prothrombin Time: 13.4 s (ref 11.4–15.2)

## 2023-12-05 LAB — BASIC METABOLIC PANEL
Anion gap: 12 (ref 5–15)
BUN: 12 mg/dL (ref 6–20)
CO2: 27 mmol/L (ref 22–32)
Calcium: 8.4 mg/dL — ABNORMAL LOW (ref 8.9–10.3)
Chloride: 101 mmol/L (ref 98–111)
Creatinine, Ser: 0.68 mg/dL (ref 0.61–1.24)
GFR, Estimated: >60 mL/min (ref >60)
Glucose, Bld: 94 mg/dL (ref 70–99)
Potassium: 3.6 mmol/L (ref 3.5–5.1)
Sodium: 140 mmol/L (ref 135–145)

## 2023-12-05 LAB — GLUCOSE, CAPILLARY
Glucose-Capillary: 110 mg/dL — ABNORMAL HIGH (ref 70–99)
Glucose-Capillary: 119 mg/dL — ABNORMAL HIGH (ref 70–99)
Glucose-Capillary: 74 mg/dL (ref 70–99)
Glucose-Capillary: 76 mg/dL (ref 70–99)
Glucose-Capillary: 87 mg/dL (ref 70–99)

## 2023-12-05 LAB — MAGNESIUM: Magnesium: 2.2 mg/dL (ref 1.7–2.4)

## 2023-12-05 LAB — TRIGLYCERIDES: Triglycerides: 139 mg/dL (ref <150)

## 2023-12-05 LAB — PHOSPHORUS: Phosphorus: 5.2 mg/dL — ABNORMAL HIGH (ref 2.5–4.6)

## 2023-12-05 MED ORDER — CHLORHEXIDINE GLUCONATE CLOTH 2 % EX PADS
6.0000 | MEDICATED_PAD | CUTANEOUS | Status: DC
  Administered 2023-12-06 – 2023-12-27 (×23): 6 via TOPICAL

## 2023-12-05 MED ORDER — GLUCAGON HCL RDNA (DIAGNOSTIC) 1 MG IJ SOLR
INTRAMUSCULAR | Status: AC
  Filled 2023-12-05: qty 1

## 2023-12-05 MED ORDER — STERILE WATER FOR INJECTION IJ SOLN
INTRAMUSCULAR | Status: AC
  Filled 2023-12-05: qty 20

## 2023-12-05 MED ORDER — ENOXAPARIN SODIUM 40 MG/0.4ML IJ SOSY
40.0000 mg | PREFILLED_SYRINGE | INTRAMUSCULAR | Status: DC
  Administered 2023-12-06: 40 mg via SUBCUTANEOUS
  Filled 2023-12-05: qty 0.4

## 2023-12-05 MED ORDER — LIDOCAINE HCL 1 % IJ SOLN
5.0000 mL | Freq: Once | INTRAMUSCULAR | Status: AC
Start: 2023-12-05 — End: 2023-12-05
  Administered 2023-12-05: 5 mL via INTRADERMAL

## 2023-12-05 MED ORDER — CEFAZOLIN SODIUM-DEXTROSE 1-4 GM/50ML-% IV SOLN
INTRAVENOUS | Status: AC | PRN
  Administered 2023-12-05: 2 g via INTRAVENOUS

## 2023-12-05 MED ORDER — CEFAZOLIN SODIUM-DEXTROSE 2-4 GM/100ML-% IV SOLN
INTRAVENOUS | Status: AC
  Filled 2023-12-05: qty 100

## 2023-12-05 MED ORDER — LIDOCAINE HCL 1 % IJ SOLN
INTRAMUSCULAR | Status: AC
  Filled 2023-12-05: qty 20

## 2023-12-05 MED ORDER — GLUCAGON HCL RDNA (DIAGNOSTIC) 1 MG IJ SOLR
INTRAMUSCULAR | Status: AC | PRN
  Administered 2023-12-05: 1 mg via INTRAVENOUS

## 2023-12-05 MED ORDER — IOHEXOL 300 MG/ML  SOLN
14.0000 mL | Freq: Once | INTRAMUSCULAR | Status: AC | PRN
  Administered 2023-12-05: 14 mL
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
## 2023-12-05 NOTE — Plan of Care (Signed)
  Problem: Fluid Volume: Goal: Hemodynamic stability will improve Outcome: Not Progressing   Problem: Clinical Measurements: Goal: Diagnostic test results will improve Outcome: Progressing   Problem: Respiratory: Goal: Ability to maintain adequate ventilation will improve Outcome: Not Progressing   Problem: Activity: Goal: Ability to tolerate increased activity will improve Outcome: Not Progressing   Problem: Respiratory: Goal: Ability to maintain a clear airway and adequate ventilation will improve Outcome: Not Progressing

## 2023-12-05 NOTE — Progress Notes (Signed)
Contacted Caitlyn [place], [place] regarding tube feed being started via G tube. PA stated "You can use the tube immediately for medications, but you have to wait 4 hours after it's been placed before you use for feeds."

## 2023-12-05 NOTE — Plan of Care (Signed)
  Problem: Fluid Volume: Goal: Hemodynamic stability will improve Outcome: Progressing   Problem: Respiratory: Goal: Ability to maintain adequate ventilation will improve Outcome: Progressing   Problem: Respiratory: Goal: Ability to maintain a clear airway and adequate ventilation will improve Outcome: Progressing   Problem: Clinical Measurements: Goal: Respiratory complications will improve Outcome: Progressing Goal: Cardiovascular complication will be avoided Outcome: Progressing   Problem: Nutrition: Goal: Adequate nutrition will be maintained Outcome: Progressing   Problem: Respiratory: Goal: Ability to maintain a clear airway and adequate ventilation will improve Outcome: Progressing

## 2023-12-05 NOTE — Progress Notes (Signed)
NAME:  Alex [name], MRN:  [id number], DOB:  1996-07-27, LOS: 16 ADMISSION DATE:  11/19/2023, CONSULTATION DATE:  11/20/23 REFERRING MD:  Dr. Arville Care, CHIEF COMPLAINT:  AMS   Brief Pt Description / Synopsis:  28 y.o. male with PMHx significant for chronic TBI with left sided hemiparesis admitted with Acute Metabolic Encephalopathy and Acute Hypoxic Respiratory Failure in the setting of aspiration and Pseudomonas and MRSA Pneumonia requiring intubation and mechanical ventilation. With recurrent aspiration events, to undergo Tracheostomy placement with ENT.  History of Present Illness:  28 yo M presenting to [facility] ED from outpatient rehab on 11/19/23 for evaluation of altered mental status.   History obtained per chart review and mother's telephone interview, patient unable to participate in interview due to respiratory distress and baseline TBI. 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 He was treated at [facility] for 5 months then discharged to rehab. Per his mother and legal guardian his baseline is: Non verbal but he will yell out sporadic nonsensical words with left sided paralysis. He is able to move his RUE in order to feed himself with finger foods and he has some involuntary movement with that arm, he will swat at you. Similar baseline function with RLE, he can kick and move, but often will lay it bent and to the side. He has enough strength to even try and get out of bed on the right side. She denies any issues with swallowing, but eats mostly soft food. If he is not watched closely with eating he will try to eat all the food at once. The facility staff reported he was at his normal baseline until lunchtime on 11/19/23. It was observed he was more somnolent and not interactive. Staff noted a cough and slightly increased work of breathing. Mom also confirmed noting a congested cough the last time she visited earlier in the week. Staff and mom denied nausea/ vomiting, but mom  reports chronic loose stools.   EMS reported the patient febrile and tachycardic on arrival.   ED course: Upon arrival patient tachycardic and lethargic. Sepsis protocol initiated with antibiotics and IVF resuscitation. Labs significant for mild hypokalemia, otherwise WNL. Initial imaging unremarkable but then patient became hypoxic with SpO2 85% on RA and a CTa was obtained, negative for PE but concerning for aspiration pneumonia. TRH consulted for admission. While patient was pending admission in ED he became increasingly hypoxic in the 60's, placed on a NRB without improvement. PCCM alerted the need to emergently intubate the patient. Medications given: Cefepime, Flagyl, Vancomycin, 2 L IVF bolus, IV contrast Initial Vitals: 97.9, 17, 130, 119/82 & 92 on RA Significant labs: (Labs/ Imaging personally reviewed) EKG : pending Chemistry: Na+: 138, K+: 3.4, BUN/Cr.: 13/ 0.65, Serum CO2/ AG: 26/8 Hematology: WBC: 4.3, Hgb: 14.3,  Lactic/ PCT: 1.1 > 1.4 / pending, COVID-19 & Influenza A/B: negative   ABG: pending post intubation   CXR 11/19/23: no active disease CT head wo contrast 11/19/23: No evidence of acute intracranial abnormality. Stable multifocal encephalomalacia, likely the sequela of prior trauma. Similar age-advanced cerebral atrophy CT angio chest PE 11/19/23:  No pulmonary embolus. Near complete filling of the right mainstem bronchus with filling extending into the bronchus intermedius, right upper, middle and lower lobe bronchi. Layering debris in the trachea. Findings are highly suspicious for aspiration. Minimal ill-defined patchy and nodular airspace disease in the dependent right lower lobe, likely postobstructive pneumonia.   PCCM consulted for assistance in management and  monitoring due to acute hypoxic respiratory failure requiring urgent intubation and mechanical ventilatory support secondary to suspected aspiration and pneumonia.  Please see "Significant Hospital Events"  section below for full detailed hospital course.   Pertinent  Medical History  Seizures TBI (09/2019)  Micro Data:  1/27: SARS-CoV-2/flu/RSV PCR>> negative 1/27: Blood culture x 2>> no growth 1/28: MRSA PCR>>Positive  1/28: Tracheal aspirate>>Pseudomonas aeruginosa and Staphylococcus aureus 1/28: HIV screen>> nonreactive 1/29: BAL>>Pseudomonas aeruginosa & MRSA 2/07: Cdiff>>negative   Antimicrobials:   Anti-infectives (From admission, onward)    Start     Dose/Rate Route Frequency Ordered Stop   12/05/23 1443  ceFAZolin (ANCEF) IVPB 1 g/50 mL premix        over 30 Minutes  Continuous PRN 12/05/23 1443     12/05/23 0000  ceFAZolin (ANCEF) IVPB 2g/100 mL premix        2 g 200 mL/hr over 30 Minutes Intravenous To Radiology 12/04/23 1324 12/05/23 0041   12/01/23 2200  vancomycin (VANCOREADY) IVPB 750 mg/150 mL  Status:  Discontinued        750 mg 150 mL/hr over 60 Minutes Intravenous Every 8 hours 12/01/23 1205 12/01/23 1215   12/01/23 1400  vancomycin (VANCOREADY) IVPB 1250 mg/250 mL  Status:  Discontinued        1,250 mg 166.7 mL/hr over 90 Minutes Intravenous  Once 12/01/23 1205 12/01/23 1215   12/01/23 1315  linezolid (ZYVOX) IVPB 600 mg        600 mg 300 mL/hr over 60 Minutes Intravenous Every 12 hours 12/01/23 1215 12/07/23 2359   12/01/23 0230  piperacillin-tazobactam (ZOSYN) IVPB 3.375 g        3.375 g 12.5 mL/hr over 240 Minutes Intravenous Every 8 hours 12/01/23 0131 12/08/23 0759   11/30/23 1500  levofloxacin (LEVAQUIN) IVPB 750 mg  Status:  Discontinued        750 mg 100 mL/hr over 90 Minutes Intravenous Every 24 hours 11/30/23 0345 12/01/23 0048   11/28/23 0100  vancomycin (VANCOREADY) IVPB 1250 mg/250 mL       Placed in "Followed by" Linked Group   1,250 mg 166.7 mL/hr over 90 Minutes Intravenous Every 12 hours 11/27/23 1153 11/29/23 1331   11/27/23 1300  vancomycin (VANCOREADY) IVPB 1500 mg/300 mL       Placed in "Followed by" Linked Group   1,500 mg 150  mL/hr over 120 Minutes Intravenous  Once 11/27/23 1153 11/27/23 1516   11/23/23 1500  levofloxacin (LEVAQUIN) IVPB 750 mg        750 mg 100 mL/hr over 90 Minutes Intravenous Every 24 hours 11/23/23 1341 11/29/23 1551   11/21/23 2200  doxycycline (VIBRAMYCIN) 100 mg in sodium chloride 0.9 % 250 mL IVPB  Status:  Discontinued        100 mg 125 mL/hr over 120 Minutes Intravenous Every 12 hours 11/21/23 1425 11/27/23 1148   11/21/23 2000  metroNIDAZOLE (FLAGYL) IVPB 500 mg  Status:  Discontinued        500 mg 100 mL/hr over 60 Minutes Intravenous Every 12 hours 11/21/23 1425 11/24/23 1227   11/20/23 1200  Ampicillin-Sulbactam (UNASYN) 3 g in sodium chloride 0.9 % 100 mL IVPB  Status:  Discontinued        3 g 200 mL/hr over 30 Minutes Intravenous Every 6 hours 11/20/23 1131 11/21/23 1423   11/20/23 0800  cefTRIAXone (ROCEPHIN) 2 g in sodium chloride 0.9 % 100 mL IVPB  Status:  Discontinued        2  g 200 mL/hr over 30 Minutes Intravenous Every 24 hours 11/19/23 2322 11/20/23 0229   11/20/23 0100  azithromycin (ZITHROMAX) 500 mg in sodium chloride 0.9 % 250 mL IVPB  Status:  Discontinued        500 mg 250 mL/hr over 60 Minutes Intravenous Every 24 hours 11/19/23 2322 11/21/23 1423   11/19/23 1545  ceFEPIme (MAXIPIME) 2 g in sodium chloride 0.9 % 100 mL IVPB        2 g 200 mL/hr over 30 Minutes Intravenous  Once 11/19/23 1541 11/19/23 1630   11/19/23 1545  metroNIDAZOLE (FLAGYL) IVPB 500 mg        500 mg 100 mL/hr over 60 Minutes Intravenous  Once 11/19/23 1541 11/19/23 1748   11/19/23 1545  vancomycin (VANCOCIN) IVPB 1000 mg/200 mL premix        1,000 mg 200 mL/hr over 60 Minutes Intravenous  Once 11/19/23 1541 11/19/23 1917      Significant Hospital Events: Including procedures, antibiotic start and stop dates in addition to other pertinent events   11/20/23: Admit to ICU due to acute hypoxic respiratory failure requiring urgent intubation and mechanical ventilatory support secondary to  suspected aspiration and pneumonia 11/21/23- patient moving RUE, mother at bedside we reviewed medical plan. He remains on 37mcg/kg/hr levophed, today plan to rescusitate more aggresively and wean from levophed and potentially extubate post SBP.   He is febrile this am.  He is on zithromax, unasyn 11/22/23- s/p bronch yesterday with aspiration of mucus plugging.  Today resp status improved with liberation protocol in proces. SLP post extubation today. 11/23/23- patient is +for pseudomonas resp cultures.  He is also MRSA pcr +, refined therapy during rounds with pharmacist. He remains on MV 11/24/23- patient is still on vasopressor support weaning down on MV.  Secretions are slightly better.  11/25/23- patient weaned off levophed, failed SBT with tachypnea, tachycardia.  Secretions much improved. 11/26/23- on minimal vent support, unable to perform SBT due to copious secretions from ETT.   11/27/23- on minimal vent support, secretions improved.  Change Doxycycline to Vancomycin.  Plan for SBT as tolerated. 11/28/23-Pt successfully extubated 02/4, currently tolerating HHFNC @35L /35%.  Required low dose precedex overnight to allow suctioning due to excessive secretions. Pt with suspected seizure activity developed tremors in the right upper extremity and became minimally responsive.  Received 1g of iv keppra.    11/29/23-Overnight pt developed sinus tachycardia/svt 140 to 160's improved following 2.5 mg iv metoprolol.  Tolerating RA with no signs of respiratory distress.  Transferring to progressive care unit TRH to pick on 02/7 12/01/23: Pt transferred back to ICU with severe acute hypoxic respiratory failure initially placed on HHFNC but due to significant hypoxia O2 sats in the 60's he required reintubation and underwent emergent bronchoscopy which revealed thick secretions in the LUL and LLL resulting in mucous plugging. Therapeutic aspiration performed. CXR showed collapse of the LUL secondary to mucus plugging   12/03/23: Pt remains mechanically intubated on minimal vent settings.  ENT consulted for tracheostomy placement due to recurrent respiratory failure due to inability to clear secretions.  Requiring levophed gtt to maintain map 65 or higher  12/04/23: No acute events overnight on minimal settings pending tracheostomy placement  12/05/23: No acute events overnight, on minimal vent support. Initially on low dose Levophed, now weaned off.  Pending Tracheostomy placement on Friday.  Interim History / Subjective:  As outlined above under significant events   Objective   Blood pressure 99/63, pulse (!) 110, temperature  99.7 F (37.6 C), temperature source Axillary, resp. rate 20, height 5\' 9"  (1.753 m), weight 63.2 kg, SpO2 100%.    Vent Mode: PRVC FiO2 (%):  [2 %-28 %] 28 % Set Rate:  [20 bmp] 20 bmp Vt Set:  [450 mL] 450 mL PEEP:  [8 cmH20] 8 cmH20 Plateau Pressure:  [17 cmH20-22 cmH20] 21 cmH20   Intake/Output Summary (Last 24 hours) at 12/05/2023 1447 Last data filed at 12/05/2023 1300 Gross per 24 hour  Intake 1924.01 ml  Output 1625 ml  Net 299.01 ml   Filed Weights   12/03/23 0410 12/04/23 0400 12/05/23 0500  Weight: 61.4 kg 63.1 kg 63.2 kg    Examination: General: Acute on chronically-ill appearing frail male, NAD mechanically intubated   HENT: Atraumatic, normocephalic, neck supple, no JVD Lungs: Faint rhonchi throughout, even, non labored  Cardiovascular: Sinus tachycardia, s1s2, no m/r/g, 2+ radial/2+ distal pulses, trace generalized edema  Abdomen: +BS x4, soft, non distended  Extremities: Left upper and lower extremities contractured, bilateral foot drop Neuro: Left-sided hemiparesis with left arm contractured (baseline), opens eyes to voice and tracks GU: Indwelling foley catheter in place draining yellow urine pending removal   Resolved Hospital Problem list   Mechanical intubation   Assessment & Plan:   #Sinus tachycardia/SVT  #Sepsis  #Hypotension secondary to  sedating medication and possible recurrent sepsis  -Continuous cardiac monitoring -Maintain MAP >65 -IV fluids -Vasopressors as needed to maintain MAP goal -Lactic acid is normalized -HS Troponin negative x2  #Acute hypoxic respiratory failure secondary to aspiration, LUL/LLL mucous plugging, and collapse of the LUL due to mucous plugging in the setting of TBI s/p emergent bronchoscopy 02/8 #Mechanical ventilation  - Full vent support for now: vent settings reviewed and established  - Continue lung protective strategies  - Maintain plateau pressures less than 30 cm H2O - Maintain O2 sats 92% or higher  - Prn bronchodilator therapy  - Aggressive pulmonary hygiene - ABX as above - ENT consulted for tracheostomy placement   #Pseudomonas & MRSA pneumonia #Aspiration pneumonia  - Trend WBC and monitor fever curve  - Continue zosyn and linezolid with completion date 12/07/23 per ID recommendations  - ID consulted appreciate input   #Chronic Seizure Disorder #Chronic TBI #Sedation needs in setting of mechanical ventilation EEG 02/10: obtained while sedated on propofol and comatose and is abnormal due to severe diffuse slowing indicative of global cerebral dysfunction, medication effect, or both. Epileptiform abnormalities were not seen during this recording.  -Maintain a RASS goal of 0 to -1 -Dilaudid and Propofol as needed to maintain RASS goal -Avoid sedating medications as able -Daily wake up assessment -Continue outpatient vimpat and valproic acid  -Continue outpatient baclofen  -Continue seroquel  -Seizure precautions   #Dysphagia - NGT for TF's pt pending gastrostomy tube placement per IR    Best Practice (right click and "Reselect all SmartList Selections" daily)   Diet/type: NPO, tube feeds DVT prophylaxis: LMWH GI prophylaxis: PPI Lines: right internal jugular central line, and is still needed Foley: yes, and still needed Code Status:  full code Last date of  multidisciplinary goals of care discussion [12/05/23]  2/12: Will update pts mother via telephone regarding pts condition and current plan of care.    Labs   CBC: Recent Labs  Lab 12/01/23 0555 12/02/23 0458 12/03/23 0431 12/04/23 0425 12/05/23 0344  WBC 13.2* 16.2* 8.7 5.6 4.5  HGB 12.2* 10.9* 10.6* 9.4* 9.4*  HCT 36.9* 33.1* 33.4* 28.6* 28.8*  MCV 95.1 95.9  98.2 94.7 95.7  PLT 452* 536* 417* 356 347    Basic Metabolic Panel: Recent Labs  Lab 12/01/23 0555 12/02/23 0458 12/03/23 0431 12/04/23 0425 12/05/23 0343  NA 140 139 138 137 140  K 3.5 3.1* 4.7 3.5 3.6  CL 104 100 100 100 101  CO2 25 25 26 26 27   GLUCOSE 127* 188* 101* 113* 94  BUN 9 16 11 10 12   CREATININE 0.54* 0.56* 0.59* 0.58* 0.68  CALCIUM 8.8* 8.0* 8.4* 7.8* 8.4*  MG 2.0 1.7 2.6* 2.2 2.2  PHOS 3.3 4.7* 4.7* 4.8* 5.2*   GFR: Estimated Creatinine Clearance: 122.9 mL/min (by C-G formula based on SCr of 0.68 mg/dL). Recent Labs  Lab 11/30/23 0600 12/01/23 0555 12/01/23 1052 12/01/23 1414 12/01/23 1735 12/02/23 0458 12/03/23 0431 12/04/23 0425 12/05/23 0344  PROCALCITON <0.10 <0.10  --   --   --   --   --   --   --   WBC 15.8* 13.2*  --   --   --  16.2* 8.7 5.6 4.5  LATICACIDVEN  --   --  3.2* 2.2* 1.9  --   --   --   --     Liver Function Tests: Recent Labs  Lab 12/02/23 0458 12/03/23 0431  ALBUMIN 2.5* 2.6*    No results for input(s): "LIPASE", "AMYLASE" in the last 168 hours. No results for input(s): "AMMONIA" in the last 168 hours.  ABG    Component Value Date/Time   PHART 7.44 12/01/2023 1143   PCO2ART 34 12/01/2023 1143   PO2ART 63 (L) 12/01/2023 1143   HCO3 23.1 12/01/2023 1143   TCO2 28 05/31/2021 0404   ACIDBASEDEF 0.5 12/01/2023 1143   O2SAT 93 12/01/2023 1143     Coagulation Profile: Recent Labs  Lab 12/03/23 1018 12/05/23 0344  INR 1.0 1.0     Cardiac Enzymes: No results for input(s): "CKTOTAL", "CKMB", "CKMBINDEX", "TROPONINI" in the last 168  hours.  HbA1C: Hgb A1c MFr Bld  Date/Time Value Ref Range Status  04/11/2022 06:10 AM 5.1 4.8 - 5.6 % Final    Comment:    (NOTE) Pre diabetes:          5.7%-6.4%  Diabetes:              >6.4%  Glycemic control for   <7.0% adults with diabetes   05/31/2021 04:18 PM 4.9 4.8 - 5.6 % Final    Comment:    (NOTE) Pre diabetes:          5.7%-6.4%  Diabetes:              >6.4%  Glycemic control for   <7.0% adults with diabetes     CBG: Recent Labs  Lab 12/04/23 1939 12/04/23 2328 12/05/23 0353 12/05/23 0736 12/05/23 1155  GLUCAP 90 129* 87 74 119*    Review of Systems:   Unable to assess pt nonverbal/intubated/sedated  Past Medical History:  He,  has a past medical history of 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   Surgical History:   Past Surgical History:  Procedure Laterality Date   GASTROSTOMY TUBE PLACEMENT       Social History:   reports that he has never smoked. He has never used smokeless tobacco. He reports that he does not currently use alcohol. He reports that he does not use drugs.   Family History:  His family history is not on file.  Allergies No Known Allergies   Home Medications  Prior to Admission medications   Medication Sig Start Date End Date Taking? Authorizing Provider  baclofen (LIORESAL) 10 MG tablet Take 10 mg by mouth 3 (three) times daily.   Yes [provider]  diazePAM, 15 MG Dose, (VALTOCO 15 MG DOSE) 2 x 7.5 MG/0.1ML LQPK Spray 7.5 mg into each nostril in the event of a seizure 04/20/22  Yes Zigmund Daniel., MD  gabapentin (NEURONTIN) 400 MG capsule Take 1 capsule (400 mg total) by mouth 3 (three) times daily. 04/20/22 11/20/23 Yes Zigmund Daniel., MD  lacosamide 100 MG TABS Take 1 tablet (100 mg total) by mouth 2 (two) times daily. 04/20/22 11/20/23 Yes Zigmund Daniel., MD  melatonin 5 MG TABS Take 10 mg  by mouth at bedtime.   Yes [provider]  QUEtiapine (SEROQUEL XR) 200 MG 24 hr tablet Take 200 mg by mouth in the morning.   Yes [provider]  QUEtiapine (SEROQUEL) 25 MG tablet Take 25 mg by mouth at bedtime.   Yes [provider]  valproic acid (DEPAKENE) 250 MG capsule Take 4 capsules (1,000 mg total) by mouth 2 (two) times daily. Patient taking differently: Take 500 mg by mouth every morning. 04/20/22 11/20/23 Yes Zigmund Daniel., MD  valproic acid (DEPAKENE) 250 MG capsule Take 750 mg by mouth every evening.   Yes [provider]     Critical care time: 40 minutes     Harlon Ditty, AGACNP-BC [facility] Prefer epic messenger for cross cover needs If after hours, please call E-link
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
## 2023-12-05 NOTE — Progress Notes (Signed)
Patient here in IR today from CCU on ventilator for PEG tube placement per Dr Archer Asa, on ventilator with respiroator therapy along with [place] Rn from CCU 18, patient on dilaudid as well as propofol gtt, will monitor vitals, ancef 2 gms being given for procedure.

## 2023-12-06 DIAGNOSIS — J9601 Acute respiratory failure with hypoxia: Secondary | ICD-10-CM | POA: Diagnosis not present

## 2023-12-06 DIAGNOSIS — J690 Pneumonitis due to inhalation of food and vomit: Secondary | ICD-10-CM | POA: Diagnosis not present

## 2023-12-06 LAB — CBC WITH DIFFERENTIAL/PLATELET
Abs Immature Granulocytes: 0.03 10*3/uL (ref 0.00–0.07)
Basophils Absolute: 0 10*3/uL (ref 0.0–0.1)
Basophils Relative: 1 %
Eosinophils Absolute: 0.1 10*3/uL (ref 0.0–0.5)
Eosinophils Relative: 3 %
HCT: 28.4 % — ABNORMAL LOW (ref 39.0–52.0)
Hemoglobin: 9.2 g/dL — ABNORMAL LOW (ref 13.0–17.0)
Immature Granulocytes: 1 %
Lymphocytes Relative: 33 %
Lymphs Abs: 1.4 10*3/uL (ref 0.7–4.0)
MCH: 31.2 pg (ref 26.0–34.0)
MCHC: 32.4 g/dL (ref 30.0–36.0)
MCV: 96.3 fL (ref 80.0–100.0)
Monocytes Absolute: 0.4 10*3/uL (ref 0.1–1.0)
Monocytes Relative: 10 %
Neutro Abs: 2.3 10*3/uL (ref 1.7–7.7)
Neutrophils Relative %: 52 %
Platelets: 353 10*3/uL (ref 150–400)
RBC: 2.95 MIL/uL — ABNORMAL LOW (ref 4.22–5.81)
RDW: 13.5 % (ref 11.5–15.5)
WBC: 4.3 10*3/uL (ref 4.0–10.5)
nRBC: 0 % (ref 0.0–0.2)

## 2023-12-06 LAB — MAGNESIUM: Magnesium: 2.4 mg/dL (ref 1.7–2.4)

## 2023-12-06 LAB — BASIC METABOLIC PANEL
Anion gap: 11 (ref 5–15)
BUN: 8 mg/dL (ref 6–20)
CO2: 26 mmol/L (ref 22–32)
Calcium: 8.5 mg/dL — ABNORMAL LOW (ref 8.9–10.3)
Chloride: 101 mmol/L (ref 98–111)
Creatinine, Ser: 0.6 mg/dL — ABNORMAL LOW (ref 0.61–1.24)
GFR, Estimated: >60 mL/min (ref >60)
Glucose, Bld: 107 mg/dL — ABNORMAL HIGH (ref 70–99)
Potassium: 3.2 mmol/L — ABNORMAL LOW (ref 3.5–5.1)
Sodium: 138 mmol/L (ref 135–145)

## 2023-12-06 LAB — GLUCOSE, CAPILLARY
Glucose-Capillary: 89 mg/dL (ref 70–99)
Glucose-Capillary: 94 mg/dL (ref 70–99)
Glucose-Capillary: 104 mg/dL — ABNORMAL HIGH (ref 70–99)
Glucose-Capillary: 81 mg/dL (ref 70–99)
Glucose-Capillary: 94 mg/dL (ref 70–99)

## 2023-12-06 LAB — PHOSPHORUS: Phosphorus: 4.4 mg/dL (ref 2.5–4.6)

## 2023-12-06 MED ORDER — OSMOLITE 1.5 CAL PO LIQD
1000.0000 mL | ORAL | Status: DC
  Administered 2023-12-06 – 2024-05-14 (×97): 1000 mL

## 2023-12-06 MED ORDER — POTASSIUM CHLORIDE 20 MEQ PO PACK
40.0000 meq | PACK | Freq: Once | ORAL | Status: AC
  Administered 2023-12-06: 40 meq
  Filled 2023-12-06: qty 2

## 2023-12-06 MED ORDER — BISACODYL 10 MG RE SUPP
10.0000 mg | Freq: Every day | RECTAL | Status: DC | PRN
  Administered 2023-12-06: 10 mg via RECTAL
  Filled 2023-12-06 (×2): qty 1
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
## 2023-12-06 NOTE — Progress Notes (Signed)
S/p perc G-tube placement yesterday. No complications. Family at bedside  G-tube intact, site clean. T-tacs present. TF already running, no leakage.  T-tacs will need to be removed in 10-14 days  Brayton El PA-C Interventional Radiology 12/06/2023 10:12 AM
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
## 2023-12-06 NOTE — Progress Notes (Signed)
 NAME:  Alex [name], MRN:  [id number], DOB:  1996-01-16, LOS: 17 ADMISSION DATE:  11/19/2023, CONSULTATION DATE:  11/20/23 REFERRING MD:  Dr. Arville Care, CHIEF COMPLAINT:  AMS   Brief Pt Description / Synopsis:  28 y.o. male with PMHx significant for chronic TBI with left sided hemiparesis admitted with Acute Metabolic Encephalopathy and Acute Hypoxic Respiratory Failure in the setting of aspiration and Pseudomonas and MRSA Pneumonia requiring intubation and mechanical ventilation. With recurrent aspiration events, to undergo Tracheostomy placement with ENT.  History of Present Illness:  28 yo M presenting to [facility] ED from outpatient rehab on 11/19/23 for evaluation of altered mental status.   History obtained per chart review and mother's telephone interview, patient unable to participate in interview due to respiratory distress and baseline TBI. 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 He was treated at [facility] for 5 months then discharged to rehab. Per his mother and legal guardian his baseline is: Non verbal but he will yell out sporadic nonsensical words with left sided paralysis. He is able to move his RUE in order to feed himself with finger foods and he has some involuntary movement with that arm, he will swat at you. Similar baseline function with RLE, he can kick and move, but often will lay it bent and to the side. He has enough strength to even try and get out of bed on the right side. She denies any issues with swallowing, but eats mostly soft food. If he is not watched closely with eating he will try to eat all the food at once. The facility staff reported he was at his normal baseline until lunchtime on 11/19/23. It was observed he was more somnolent and not interactive. Staff noted a cough and slightly increased work of breathing. Mom also confirmed noting a congested cough the last time she visited earlier in the week. Staff and mom denied nausea/ vomiting, but mom  reports chronic loose stools.   EMS reported the patient febrile and tachycardic on arrival.   ED course: Upon arrival patient tachycardic and lethargic. Sepsis protocol initiated with antibiotics and IVF resuscitation. Labs significant for mild hypokalemia, otherwise WNL. Initial imaging unremarkable but then patient became hypoxic with SpO2 85% on RA and a CTa was obtained, negative for PE but concerning for aspiration pneumonia. TRH consulted for admission. While patient was pending admission in ED he became increasingly hypoxic in the 60's, placed on a NRB without improvement. PCCM alerted the need to emergently intubate the patient. Medications given: Cefepime, Flagyl, Vancomycin, 2 L IVF bolus, IV contrast Initial Vitals: 97.9, 17, 130, 119/82 & 92 on RA Significant labs: (Labs/ Imaging personally reviewed) EKG : pending Chemistry: Na+: 138, K+: 3.4, BUN/Cr.: 13/ 0.65, Serum CO2/ AG: 26/8 Hematology: WBC: 4.3, Hgb: 14.3,  Lactic/ PCT: 1.1 > 1.4 / pending, COVID-19 & Influenza A/B: negative   ABG: pending post intubation   CXR 11/19/23: no active disease CT head wo contrast 11/19/23: No evidence of acute intracranial abnormality. Stable multifocal encephalomalacia, likely the sequela of prior trauma. Similar age-advanced cerebral atrophy CT angio chest PE 11/19/23:  No pulmonary embolus. Near complete filling of the right mainstem bronchus with filling extending into the bronchus intermedius, right upper, middle and lower lobe bronchi. Layering debris in the trachea. Findings are highly suspicious for aspiration. Minimal ill-defined patchy and nodular airspace disease in the dependent right lower lobe, likely postobstructive pneumonia.   PCCM consulted for assistance in management and  monitoring due to acute hypoxic respiratory failure requiring urgent intubation and mechanical ventilatory support secondary to suspected aspiration and pneumonia.  Please see "Significant Hospital Events"  section below for full detailed hospital course.   Pertinent  Medical History  Seizures TBI (09/2019)  Micro Data:  1/27: SARS-CoV-2/flu/RSV PCR>> negative 1/27: Blood culture x 2>> no growth 1/28: MRSA PCR>>Positive  1/28: Tracheal aspirate>>Pseudomonas aeruginosa and Staphylococcus aureus 1/28: HIV screen>> nonreactive 1/29: BAL>>Pseudomonas aeruginosa & MRSA 2/07: Cdiff>>negative   Antimicrobials:   Anti-infectives (From admission, onward)    Start     Dose/Rate Route Frequency Ordered Stop   12/05/23 1443  ceFAZolin (ANCEF) IVPB 1 g/50 mL premix        over 30 Minutes  Continuous PRN 12/05/23 1443 12/05/23 1443   12/05/23 0000  ceFAZolin (ANCEF) IVPB 2g/100 mL premix        2 g 200 mL/hr over 30 Minutes Intravenous To Radiology 12/04/23 1324 12/05/23 0041   12/01/23 2200  vancomycin (VANCOREADY) IVPB 750 mg/150 mL  Status:  Discontinued        750 mg 150 mL/hr over 60 Minutes Intravenous Every 8 hours 12/01/23 1205 12/01/23 1215   12/01/23 1400  vancomycin (VANCOREADY) IVPB 1250 mg/250 mL  Status:  Discontinued        1,250 mg 166.7 mL/hr over 90 Minutes Intravenous  Once 12/01/23 1205 12/01/23 1215   12/01/23 1315  linezolid (ZYVOX) IVPB 600 mg        600 mg 300 mL/hr over 60 Minutes Intravenous Every 12 hours 12/01/23 1215 12/07/23 2359   12/01/23 0230  piperacillin-tazobactam (ZOSYN) IVPB 3.375 g        3.375 g 12.5 mL/hr over 240 Minutes Intravenous Every 8 hours 12/01/23 0131 12/08/23 0759   11/30/23 1500  levofloxacin (LEVAQUIN) IVPB 750 mg  Status:  Discontinued        750 mg 100 mL/hr over 90 Minutes Intravenous Every 24 hours 11/30/23 0345 12/01/23 0048   11/28/23 0100  vancomycin (VANCOREADY) IVPB 1250 mg/250 mL       Placed in "Followed by" Linked Group   1,250 mg 166.7 mL/hr over 90 Minutes Intravenous Every 12 hours 11/27/23 1153 11/29/23 1331   11/27/23 1300  vancomycin (VANCOREADY) IVPB 1500 mg/300 mL       Placed in "Followed by" Linked Group   1,500  mg 150 mL/hr over 120 Minutes Intravenous  Once 11/27/23 1153 11/27/23 1516   11/23/23 1500  levofloxacin (LEVAQUIN) IVPB 750 mg        750 mg 100 mL/hr over 90 Minutes Intravenous Every 24 hours 11/23/23 1341 11/29/23 1551   11/21/23 2200  doxycycline (VIBRAMYCIN) 100 mg in sodium chloride 0.9 % 250 mL IVPB  Status:  Discontinued        100 mg 125 mL/hr over 120 Minutes Intravenous Every 12 hours 11/21/23 1425 11/27/23 1148   11/21/23 2000  metroNIDAZOLE (FLAGYL) IVPB 500 mg  Status:  Discontinued        500 mg 100 mL/hr over 60 Minutes Intravenous Every 12 hours 11/21/23 1425 11/24/23 1227   11/20/23 1200  Ampicillin-Sulbactam (UNASYN) 3 g in sodium chloride 0.9 % 100 mL IVPB  Status:  Discontinued        3 g 200 mL/hr over 30 Minutes Intravenous Every 6 hours 11/20/23 1131 11/21/23 1423   11/20/23 0800  cefTRIAXone (ROCEPHIN) 2 g in sodium chloride 0.9 % 100 mL IVPB  Status:  Discontinued        2  g 200 mL/hr over 30 Minutes Intravenous Every 24 hours 11/19/23 2322 11/20/23 0229   11/20/23 0100  azithromycin (ZITHROMAX) 500 mg in sodium chloride 0.9 % 250 mL IVPB  Status:  Discontinued        500 mg 250 mL/hr over 60 Minutes Intravenous Every 24 hours 11/19/23 2322 11/21/23 1423   11/19/23 1545  ceFEPIme (MAXIPIME) 2 g in sodium chloride 0.9 % 100 mL IVPB        2 g 200 mL/hr over 30 Minutes Intravenous  Once 11/19/23 1541 11/19/23 1630   11/19/23 1545  metroNIDAZOLE (FLAGYL) IVPB 500 mg        500 mg 100 mL/hr over 60 Minutes Intravenous  Once 11/19/23 1541 11/19/23 1748   11/19/23 1545  vancomycin (VANCOCIN) IVPB 1000 mg/200 mL premix        1,000 mg 200 mL/hr over 60 Minutes Intravenous  Once 11/19/23 1541 11/19/23 1917      Significant Hospital Events: Including procedures, antibiotic start and stop dates in addition to other pertinent events   11/20/23: Admit to ICU due to acute hypoxic respiratory failure requiring urgent intubation and mechanical ventilatory support  secondary to suspected aspiration and pneumonia 11/21/23- patient moving RUE, mother at bedside we reviewed medical plan. He remains on 17mcg/kg/hr levophed, today plan to rescusitate more aggresively and wean from levophed and potentially extubate post SBP.   He is febrile this am.  He is on zithromax, unasyn 11/22/23- s/p bronch yesterday with aspiration of mucus plugging.  Today resp status improved with liberation protocol in proces. SLP post extubation today. 11/23/23- patient is +for pseudomonas resp cultures.  He is also MRSA pcr +, refined therapy during rounds with pharmacist. He remains on MV 11/24/23- patient is still on vasopressor support weaning down on MV.  Secretions are slightly better.  11/25/23- patient weaned off levophed, failed SBT with tachypnea, tachycardia.  Secretions much improved. 11/26/23- on minimal vent support, unable to perform SBT due to copious secretions from ETT.   11/27/23- on minimal vent support, secretions improved.  Change Doxycycline to Vancomycin.  Plan for SBT as tolerated. 11/28/23-Pt successfully extubated 02/4, currently tolerating HHFNC @35L /35%.  Required low dose precedex overnight to allow suctioning due to excessive secretions. Pt with suspected seizure activity developed tremors in the right upper extremity and became minimally responsive.  Received 1g of iv keppra.    11/29/23-Overnight pt developed sinus tachycardia/svt 140 to 160's improved following 2.5 mg iv metoprolol.  Tolerating RA with no signs of respiratory distress.  Transferring to progressive care unit TRH to pick on 02/7 12/01/23: Pt transferred back to ICU with severe acute hypoxic respiratory failure initially placed on HHFNC but due to significant hypoxia O2 sats in the 60's he required reintubation and underwent emergent bronchoscopy which revealed thick secretions in the LUL and LLL resulting in mucous plugging. Therapeutic aspiration performed. CXR showed collapse of the LUL secondary to mucus  plugging  12/03/23: Pt remains mechanically intubated on minimal vent settings.  ENT consulted for tracheostomy placement due to recurrent respiratory failure due to inability to clear secretions.  Requiring levophed gtt to maintain map 65 or higher  12/04/23: No acute events overnight on minimal settings pending tracheostomy placement  12/05/23: No acute events overnight, on minimal vent support. Initially on low dose Levophed, now weaned off.  IR placed PEG tube. Pending Tracheostomy placement on Friday. 12/06/23: No acute events overnight, on minimal vent support. Awaiting Tracheostomy placement tomorrow.  Interim History / Subjective:  As  outlined above under significant events   Objective   Blood pressure (!) 130/93, pulse (!) 105, temperature 99 F (37.2 C), temperature source Oral, resp. rate 20, height 5\' 9"  (1.753 m), weight 63.3 kg, SpO2 100%.    Vent Mode: PRVC FiO2 (%):  [28 %] 28 % Set Rate:  [20 bmp] 20 bmp Vt Set:  [450 mL] 450 mL PEEP:  [8 cmH20] 8 cmH20 Plateau Pressure:  [17 cmH20-23 cmH20] 23 cmH20   Intake/Output Summary (Last 24 hours) at 12/06/2023 0827 Last data filed at 12/06/2023 0710 Gross per 24 hour  Intake 2097.99 ml  Output 2050 ml  Net 47.99 ml   Filed Weights   12/04/23 0400 12/05/23 0500 12/06/23 0228  Weight: 63.1 kg 63.2 kg 63.3 kg    Examination: General: Acute on chronically-ill appearing frail male, NAD mechanically intubated   HENT: Atraumatic, normocephalic, neck supple, no JVD, orally intubated Lungs: Clear throughout, even, non labored, synchronous with the vent Cardiovascular: Sinus tachycardia, s1s2, no m/r/g, 2+ radial/2+ distal pulses, trace generalized edema  Abdomen: +BS x4, soft, non distended  Extremities: Left upper and lower extremities contractured, bilateral foot drop Neuro: Left-sided hemiparesis with left arm contractured (baseline), opens eyes to voice and tracks GU: Indwelling foley catheter in place draining yellow urine  pending removal   Resolved Hospital Problem list     Assessment & Plan:   #Sinus tachycardia/SVT  #Hypotension secondary to sedating medication and possible recurrent sepsis  -Continuous cardiac monitoring -Maintain MAP >65 -IV fluids -Vasopressors as needed to maintain MAP goal -Lactic acid is normalized -HS Troponin negative x2  #Acute hypoxic respiratory failure secondary to aspiration, LUL/LLL mucous plugging, and collapse of the LUL due to mucous plugging in the setting of TBI s/p emergent bronchoscopy 02/8 #Mechanical ventilation  - Full vent support for now: vent settings reviewed and established  - Continue lung protective strategies  - Maintain plateau pressures less than 30 cm H2O - Maintain O2 sats 92% or higher  - Prn bronchodilator therapy  - Aggressive pulmonary hygiene - ABX as above - ENT consulted for tracheostomy placement ~ tentative plan for Friday 2/14  #Pseudomonas & MRSA pneumonia #Aspiration pneumonia  - Trend WBC and monitor fever curve  - Continue zosyn and linezolid with completion date 12/07/23 per ID recommendations  - ID consulted appreciate input   #Chronic Seizure Disorder #Chronic TBI #Sedation needs in setting of mechanical ventilation EEG 02/10: obtained while sedated on propofol and comatose and is abnormal due to severe diffuse slowing indicative of global cerebral dysfunction, medication effect, or both. Epileptiform abnormalities were not seen during this recording.  -Maintain a RASS goal of 0 to -1 -Dilaudid and Propofol as needed to maintain RASS goal -Avoid sedating medications as able -Daily wake up assessment -Continue outpatient vimpat and valproic acid  -Continue outpatient baclofen  -Continue seroquel  -Seizure precautions   #Dysphagia - NGT for TF's pt pending gastrostomy tube placement per IR    Best Practice (right click and "Reselect all SmartList Selections" daily)   Diet/type: tube feeds ~ NPO after MN for  Tracheostomy DVT prophylaxis: LMWH GI prophylaxis: PPI Lines: right internal jugular central line, and is still needed Foley: yes, and still needed Code Status:  full code Last date of multidisciplinary goals of care discussion [12/06/23]  2/13: Updated pts mother at bedside regarding pts condition and current plan of care.    Labs   CBC: Recent Labs  Lab 12/02/23 0458 12/03/23 0431 12/04/23 0425 12/05/23 0344 12/06/23  0352  WBC 16.2* 8.7 5.6 4.5 4.3  NEUTROABS  --   --   --   --  2.3  HGB 10.9* 10.6* 9.4* 9.4* 9.2*  HCT 33.1* 33.4* 28.6* 28.8* 28.4*  MCV 95.9 98.2 94.7 95.7 96.3  PLT 536* 417* 356 347 353    Basic Metabolic Panel: Recent Labs  Lab 12/02/23 0458 12/03/23 0431 12/04/23 0425 12/05/23 0343 12/06/23 0352  NA 139 138 137 140 138  K 3.1* 4.7 3.5 3.6 3.2*  CL 100 100 100 101 101  CO2 25 26 26 27 26   GLUCOSE 188* 101* 113* 94 107*  BUN 16 11 10 12 8   CREATININE 0.56* 0.59* 0.58* 0.68 0.60*  CALCIUM 8.0* 8.4* 7.8* 8.4* 8.5*  MG 1.7 2.6* 2.2 2.2 2.4  PHOS 4.7* 4.7* 4.8* 5.2* 4.4   GFR: Estimated Creatinine Clearance: 123.1 mL/min (A) (by C-G formula based on SCr of 0.6 mg/dL (L)). Recent Labs  Lab 11/30/23 0600 12/01/23 0555 12/01/23 1052 12/01/23 1414 12/01/23 1735 12/02/23 0458 12/03/23 0431 12/04/23 0425 12/05/23 0344 12/06/23 0352  PROCALCITON <0.10 <0.10  --   --   --   --   --   --   --   --   WBC 15.8* 13.2*  --   --   --    < > 8.7 5.6 4.5 4.3  LATICACIDVEN  --   --  3.2* 2.2* 1.9  --   --   --   --   --    < > = values in this interval not displayed.    Liver Function Tests: Recent Labs  Lab 12/02/23 0458 12/03/23 0431  ALBUMIN 2.5* 2.6*    No results for input(s): "LIPASE", "AMYLASE" in the last 168 hours. No results for input(s): "AMMONIA" in the last 168 hours.  ABG    Component Value Date/Time   PHART 7.44 12/01/2023 1143   PCO2ART 34 12/01/2023 1143   PO2ART 63 (L) 12/01/2023 1143   HCO3 23.1 12/01/2023 1143    TCO2 28 05/31/2021 0404   ACIDBASEDEF 0.5 12/01/2023 1143   O2SAT 93 12/01/2023 1143     Coagulation Profile: Recent Labs  Lab 12/03/23 1018 12/05/23 0344  INR 1.0 1.0     Cardiac Enzymes: No results for input(s): "CKTOTAL", "CKMB", "CKMBINDEX", "TROPONINI" in the last 168 hours.  HbA1C: Hgb A1c MFr Bld  Date/Time Value Ref Range Status  04/11/2022 06:10 AM 5.1 4.8 - 5.6 % Final    Comment:    (NOTE) Pre diabetes:          5.7%-6.4%  Diabetes:              >6.4%  Glycemic control for   <7.0% adults with diabetes   05/31/2021 04:18 PM 4.9 4.8 - 5.6 % Final    Comment:    (NOTE) Pre diabetes:          5.7%-6.4%  Diabetes:              >6.4%  Glycemic control for   <7.0% adults with diabetes     CBG: Recent Labs  Lab 12/05/23 1155 12/05/23 1926 12/05/23 2352 12/06/23 0353 12/06/23 0745  GLUCAP 119* 76 110* 89 94    Review of Systems:   Unable to assess pt nonverbal/intubated/sedated  Past Medical History:  He,  has a past medical history of 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   Surgical History:  Past Surgical History:  Procedure Laterality Date   GASTROSTOMY TUBE PLACEMENT     IR GASTROSTOMY TUBE MOD SED  12/05/2023     Social History:   reports that he has never smoked. He has never used smokeless tobacco. He reports that he does not currently use alcohol. He reports that he does not use drugs.   Family History:  His family history is not on file.   Allergies No Known Allergies   Home Medications  Prior to Admission medications   Medication Sig Start Date End Date Taking? Authorizing Provider  baclofen (LIORESAL) 10 MG tablet Take 10 mg by mouth 3 (three) times daily.   Yes [provider]  diazePAM, 15 MG Dose, (VALTOCO 15 MG DOSE) 2 x 7.5 MG/0.1ML LQPK Spray 7.5 mg into each nostril in the event of a seizure 04/20/22  Yes Zigmund Daniel., MD  gabapentin (NEURONTIN) 400 MG capsule Take 1 capsule (400 mg total) by mouth 3 (three) times daily. 04/20/22 11/20/23 Yes Zigmund Daniel., MD  lacosamide 100 MG TABS Take 1 tablet (100 mg total) by mouth 2 (two) times daily. 04/20/22 11/20/23 Yes Zigmund Daniel., MD  melatonin 5 MG TABS Take 10 mg by mouth at bedtime.   Yes [provider]  QUEtiapine (SEROQUEL XR) 200 MG 24 hr tablet Take 200 mg by mouth in the morning.   Yes [provider]  QUEtiapine (SEROQUEL) 25 MG tablet Take 25 mg by mouth at bedtime.   Yes [provider]  valproic acid (DEPAKENE) 250 MG capsule Take 4 capsules (1,000 mg total) by mouth 2 (two) times daily. Patient taking differently: Take 500 mg by mouth every morning. 04/20/22 11/20/23 Yes Zigmund Daniel., MD  valproic acid (DEPAKENE) 250 MG capsule Take 750 mg by mouth every evening.   Yes [provider]     Critical care time: 40 minutes     Harlon Ditty, AGACNP-BC [facility] Prefer epic messenger for cross cover needs If after hours, please call E-link

## 2023-12-06 NOTE — Progress Notes (Signed)
Inpatient Progress Note   Attending:      Magnus Ivan. Okey Dupre, MD, MBA, FARS                         Otolaryngology-Head & Neck Surgery   This patient was seen today in at the request of No referring provider defined for this encounter. 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 old child who presents today with complaint of     Chief Complaint  Patient presents with   Altered Mental Status    28 yo gentleman with TBI and seizure disorder s/p prior trach at [facility] s/p decannulation.  Now reintubated with ventilatory dependence and failure to wean / extubate.  ICU team requesting trach replacement and PEG tube.     Patient preop for revision tracheotomy tomorrow.  Consent on chart after discussion of risks, benefits, and options with Mother who agrees to proceed.  HCT = 28.4; platelets = 353.  PT/INR = 13.4/1.0.  Last PTT was 46 (elevated) on 2/10, but ICU team to hold any Lovenox and/or Heparin preop (at least 6 hours).  Anticipate correction prior to surgery - will check.     REVIEW OF SYSTEMS: The patient / family denies any recent history of fever, night sweats or weight loss, pain, cyanosis, clubbing or edema, respiratory distress, dizziness or imbalance.     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   Seizure disorder (HCC)     TBI (traumatic brain injury) (HCC)                SURGICAL HISTORY:      Past Surgical History:  Procedure Laterality Date   GASTROSTOMY TUBE PLACEMENT                MEDICATIONS:  Current Medications    Current Facility-Administered Medications:    acetaminophen (TYLENOL) tablet 650 mg, 650 mg, Per Tube, Q6H PRN, 650 mg at 12/01/23 0855  **OR** acetaminophen (TYLENOL) suppository 650 mg, 650 mg, Rectal, Q6H PRN, Rust-Chester, Britton L, NP, 650 mg at 11/21/23 0230   baclofen (LIORESAL) tablet 10 mg, 10 mg, Per Tube, TID, Rust-Chester, Micheline Rough L, NP, 10 mg at 12/04/23 1536   [START ON 12/05/2023] ceFAZolin (ANCEF) IVPB 2g/100 mL premix, 2 g, Intravenous, to XRAY, McInnis, Caitlyn M, PA-C   Chlorhexidine Gluconate Cloth 2 % PADS 6 each, 6 each, Topical, Nightly, Aleskerov, Fuad, MD, 6 each at 12/04/23 1012   enoxaparin (LOVENOX) injection 40 mg, 40 mg, Subcutaneous, Q24H, Mansy, Jan A, MD, 40 mg at 12/03/23 2334   feeding supplement (OSMOLITE 1.5 CAL) liquid 1,000 mL, 1,000 mL, Per Tube, Q24H, Assaker, Jean-Pierre, MD, Infusion Verify at 12/04/23 1100   feeding supplement (PROSource TF20) liquid 60 mL, 60 mL, Per Tube, Daily, Dgayli, Khabib, MD, 60 mL at 12/04/23 1012   free water 100 mL, 100 mL, Per Tube, Q4H, Dgayli, Khabib, MD, 100 mL at 12/04/23 1200   HYDROmorphone (DILAUDID) 50 mg in 50 mL NS (1mg /mL) premix infusion, 0.5-4 mg/hr, Intravenous, Continuous, Dgayli, Khabib, MD, Last Rate: 1  mL/hr at 12/04/23 1536, 1 mg/hr at 12/04/23 1536   HYDROmorphone (DILAUDID) bolus via infusion 0.25-2 mg, 0.25-2 mg, Intravenous, Q30 min PRN, Raechel Chute, MD, 0.5 mg at 12/02/23 1449   ipratropium-albuterol (DUONEB) 0.5-2.5 (3) MG/3ML nebulizer solution 3 mL, 3 mL, Nebulization, Q6H, Marrion Coy, MD, 3 mL at 12/04/23 1323   lacosamide (VIMPAT) tablet 100 mg, 100 mg, Per Tube, BID, Lowella Bandy, RPH, 100 mg at 12/04/23 1044   linezolid (ZYVOX) IVPB 600 mg, 600 mg, Intravenous, Q12H, Ezequiel Essex, NP, Last Rate: 300 mL/hr at 12/04/23 1100, Infusion Verify at 12/04/23 1100   lip balm (BLISTEX) ointment, , Topical, PRN, Jimmye Norman, NP, Given at 11/24/23 1932   norepinephrine (LEVOPHED) 4mg  in (0.016 mg/mL) premix infusion, 0-40 mcg/min, Intravenous, Titrated, Ouma, Hubbard Hartshorn, NP, Last Rate: 11.25 mL/hr at 12/04/23  1100, 3 mcg/min at 12/04/23 1100   nutrition supplement (JUVEN) (JUVEN) powder packet 1 packet, 1 packet, Per Tube, BID BM, Dgayli, Lianne Bushy, MD, 1 packet at 12/04/23 1407   ondansetron (ZOFRAN) tablet 4 mg, 4 mg, Per Tube, Q6H PRN **OR** ondansetron (ZOFRAN) injection 4 mg, 4 mg, Intravenous, Q6H PRN, Rust-Chester, Cecelia Byars, NP   Oral care mouth rinse, 15 mL, Mouth Rinse, PRN, Vida Rigger, MD   Oral care mouth rinse, 15 mL, Mouth Rinse, Q2H, Dgayli, Khabib, MD, 15 mL at 12/04/23 1407   Oral care mouth rinse, 15 mL, Mouth Rinse, PRN, Dgayli, Khabib, MD   pantoprazole (PROTONIX) injection 40 mg, 40 mg, Intravenous, Q12H, Rust-Chester, Britton L, NP, 40 mg at 12/04/23 1011   piperacillin-tazobactam (ZOSYN) IVPB 3.375 g, 3.375 g, Intravenous, Q8H, Ezequiel Essex, NP, Last Rate: 12.5 mL/hr at 12/04/23 1407, 3.375 g at 12/04/23 1407   polyethylene glycol (MIRALAX / GLYCOLAX) packet 17 g, 17 g, Per Tube, Daily, Assaker, [place] B[place], MD, 17 g at 12/04/23 1225   propofol (DIPRIVAN) 1000 MG/100ML infusion, 5-80 mcg/kg/min, Intravenous, Titrated, Dgayli, Khabib, MD, Last Rate: 9.51 mL/hr at 12/04/23 1100, 25 mcg/kg/min at 12/04/23 1100   QUEtiapine (SEROQUEL) tablet 50 mg, 50 mg, Per Tube, BID, Rust-Chester, Micheline Rough L, NP, 50 mg at 12/04/23 1011   senna (SENOKOT) tablet 8.6 mg, 1 tablet, Per Tube, Daily, Assaker, [place], MD, 8.6 mg at 12/04/23 1225   sodium chloride flush (NS) 0.9 % injection 10-40 mL, 10-40 mL, Intracatheter, Q12H, Karna Christmas, Fuad, MD, 10 mL at 12/04/23 1012   sodium chloride flush (NS) 0.9 % injection 10-40 mL, 10-40 mL, Intracatheter, PRN, Vida Rigger, MD   valproic acid (DEPAKENE) 250 MG/5ML solution 500 mg, 500 mg, Per Tube, Daily, Lowella Bandy, RPH, 500 mg at 12/04/23 1012   valproic acid (DEPAKENE) 250 MG/5ML solution 750 mg, 750 mg, Per Tube, QHS, Lowella Bandy, RPH, 750 mg at 12/03/23 2147       ALLERGIES: Allergies  No Known Allergies       BIRTH HX:  Term -  no complications.     SOCIAL HISTORY: Social History         Socioeconomic History   Marital status: Single      Spouse name: Not on file   Number of children: Not on file   Years of education: Not on file   Highest education level: Not on file  Occupational History   Not on file  Tobacco Use   Smoking status: Never   Smokeless tobacco: Never  Substance and Sexual Activity   Alcohol use: Not Currently   Drug use: Never   Sexual  activity: Not Currently  Other Topics Concern   Not on file  Social History Narrative   Not on file    Social Drivers of Health        Financial Resource Strain: Low Risk  (12/02/2021)    Received from [facility]    Overall Financial Resource Strain (CARDIA)     Difficulty of Paying Living Expenses: Not very hard  Food Insecurity: Patient Unable To Answer (11/20/2023)    Hunger Vital Sign     Worried About Running Out of Food in the Last Year: Patient unable to answer     Ran Out of Food in the Last Year: Patient unable to answer  Transportation Needs: Patient Unable To Answer (11/20/2023)    PRAPARE - Transportation     Lack of Transportation (Medical): Patient unable to answer     Lack of Transportation (Non-Medical): Patient unable to answer  Physical Activity: Not on file  Stress: Not on file  Social Connections: Not on file  Intimate Partner Violence: Patient Unable To Answer (11/20/2023)    Humiliation, Afraid, Rape, and Kick questionnaire     Fear of Current or Ex-Partner: Patient unable to answer     Emotionally Abused: Patient unable to answer     Physically Abused: Patient unable to answer     Sexually Abused: Patient unable to answer        FAMILY HISTORY: History reviewed. No pertinent family history.         PHYSICAL EXAM: BP (!) 86/54   Pulse (!) 106   Temp 98 F (36.7 C) (Oral)   Resp 20   Ht 5\' 9"  (1.753 m)   Wt 63.1 kg   SpO2 100%   BMI 20.54 kg/m     The patient is intubated - no accute distress.      There was no respiratory distress, stridor or retractions.     Skin exam of the scalp, face and neck appeared normal.   The ear exam revealed patent external auditory canals and tympanic membranes to be clear bilaterally without perforation, drainage, middle ear fluid or evidence of acute infection.     The nose was patent bilaterally with no purulent drainage, polyps or mass lesions.  The nasal septum appeared midline.   Oral cavity exam revealed no mass or mucosal lesions, erythema or exudate.     The neck was nontender, without palpable adenopathy or mass lesion.  Thin neck with evidence of healed prior trach site.  Landmarks palpable.   Neurologic exam revealed cranial nerves II-XII to be grossly intact and symmetic, including the 7th cranial nerve, which was intact and symmetric bilaterally.    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  Anticipate return to the ICU postop as well as tracheostomy tube change by ENT on POD 5.     I discussed the risks, benefits and options for the proposed procedure with the family (Mom) at length today - she understands and agree to proceed.     I discussed with her as well, that we'd be quite careful and perhaps  avoid decannulation this time as the trach tube was likely helping and we wouldn't want to remove and replace again going forward if at all possible.     PICU team to hold Lovenox and check coags preop.  They will also hold any tube feeds after MN prior to surgery.  Consult signed, witnessed and on chart.  Greatly appreciate their assistance.   -----   Update 12/06/23:  28 yo gentleman with TBI  and seizure disorder s/p prior trach at [facility] s/p decannulation.  Now reintubated with ventilatory dependence and failure to wean / extubate.  ICU team requesting trach replacement and PEG tube.     Patient preop for revision tracheotomy tomorrow.  Consent on chart after discussion of risks, benefits, and options with Mother who agrees to proceed.  HCT = 28.4; platelets = 353.  PT/INR = 13.4/1.0.  Last PTT was 46 (elevated) on 2/10, but ICU team to hold any Lovenox and/or Heparin preop (at least 6 hours).  Anticipate correction prior to surgery - will check.       Magnus Ivan. Okey Dupre, MD, MBA, [facility] Otolaryngology-Head & Neck Surgery   [facility] [phone]

## 2023-12-06 NOTE — H&P (View-Only) (Signed)
Inpatient Progress Note   Attending:      Magnus Ivan. Okey Dupre, MD, MBA, FARS                         Otolaryngology-Head & Neck Surgery   This patient was seen today in at the request of No referring provider defined for this encounter. 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 old child who presents today with complaint of     Chief Complaint  Patient presents with   Altered Mental Status    28 yo gentleman with TBI and seizure disorder s/p prior trach at [facility] s/p decannulation.  Now reintubated with ventilatory dependence and failure to wean / extubate.  ICU team requesting trach replacement and PEG tube.     Patient preop for revision tracheotomy tomorrow.  Consent on chart after discussion of risks, benefits, and options with Mother who agrees to proceed.  HCT = 28.4; platelets = 353.  PT/INR = 13.4/1.0.  Last PTT was 46 (elevated) on 2/10, but ICU team to hold any Lovenox and/or Heparin preop (at least 6 hours).  Anticipate correction prior to surgery - will check.     REVIEW OF SYSTEMS: The patient / family denies any recent history of fever, night sweats or weight loss, pain, cyanosis, clubbing or edema, respiratory distress, dizziness or imbalance.     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   Seizure disorder (HCC)     TBI (traumatic brain injury) (HCC)                SURGICAL HISTORY:      Past Surgical History:  Procedure Laterality Date   GASTROSTOMY TUBE PLACEMENT                MEDICATIONS:  Current Medications    Current Facility-Administered Medications:    acetaminophen (TYLENOL) tablet 650 mg, 650 mg, Per Tube, Q6H PRN, 650 mg at 12/01/23 0855  **OR** acetaminophen (TYLENOL) suppository 650 mg, 650 mg, Rectal, Q6H PRN, Rust-Chester, Britton L, NP, 650 mg at 11/21/23 0230   baclofen (LIORESAL) tablet 10 mg, 10 mg, Per Tube, TID, Rust-Chester, Micheline Rough L, NP, 10 mg at 12/04/23 1536   [START ON 12/05/2023] ceFAZolin (ANCEF) IVPB 2g/100 mL premix, 2 g, Intravenous, to XRAY, McInnis, Caitlyn M, PA-C   Chlorhexidine Gluconate Cloth 2 % PADS 6 each, 6 each, Topical, Nightly, Aleskerov, Fuad, MD, 6 each at 12/04/23 1012   enoxaparin (LOVENOX) injection 40 mg, 40 mg, Subcutaneous, Q24H, Mansy, Jan A, MD, 40 mg at 12/03/23 2334   feeding supplement (OSMOLITE 1.5 CAL) liquid 1,000 mL, 1,000 mL, Per Tube, Q24H, Assaker, Jean-Pierre, MD, Infusion Verify at 12/04/23 1100   feeding supplement (PROSource TF20) liquid 60 mL, 60 mL, Per Tube, Daily, Dgayli, Khabib, MD, 60 mL at 12/04/23 1012   free water 100 mL, 100 mL, Per Tube, Q4H, Dgayli, Khabib, MD, 100 mL at 12/04/23 1200   HYDROmorphone (DILAUDID) 50 mg in 50 mL NS (1mg /mL) premix infusion, 0.5-4 mg/hr, Intravenous, Continuous, Dgayli, Khabib, MD, Last Rate: 1  mL/hr at 12/04/23 1536, 1 mg/hr at 12/04/23 1536   HYDROmorphone (DILAUDID) bolus via infusion 0.25-2 mg, 0.25-2 mg, Intravenous, Q30 min PRN, Raechel Chute, MD, 0.5 mg at 12/02/23 1449   ipratropium-albuterol (DUONEB) 0.5-2.5 (3) MG/3ML nebulizer solution 3 mL, 3 mL, Nebulization, Q6H, Marrion Coy, MD, 3 mL at 12/04/23 1323   lacosamide (VIMPAT) tablet 100 mg, 100 mg, Per Tube, BID, Lowella Bandy, RPH, 100 mg at 12/04/23 1044   linezolid (ZYVOX) IVPB 600 mg, 600 mg, Intravenous, Q12H, Ezequiel Essex, NP, Last Rate: 300 mL/hr at 12/04/23 1100, Infusion Verify at 12/04/23 1100   lip balm (BLISTEX) ointment, , Topical, PRN, Jimmye Norman, NP, Given at 11/24/23 1932   norepinephrine (LEVOPHED) 4mg  in (0.016 mg/mL) premix infusion, 0-40 mcg/min, Intravenous, Titrated, Ouma, Hubbard Hartshorn, NP, Last Rate: 11.25 mL/hr at 12/04/23  1100, 3 mcg/min at 12/04/23 1100   nutrition supplement (JUVEN) (JUVEN) powder packet 1 packet, 1 packet, Per Tube, BID BM, Dgayli, Lianne Bushy, MD, 1 packet at 12/04/23 1407   ondansetron (ZOFRAN) tablet 4 mg, 4 mg, Per Tube, Q6H PRN **OR** ondansetron (ZOFRAN) injection 4 mg, 4 mg, Intravenous, Q6H PRN, Rust-Chester, Cecelia Byars, NP   Oral care mouth rinse, 15 mL, Mouth Rinse, PRN, Vida Rigger, MD   Oral care mouth rinse, 15 mL, Mouth Rinse, Q2H, Dgayli, Khabib, MD, 15 mL at 12/04/23 1407   Oral care mouth rinse, 15 mL, Mouth Rinse, PRN, Dgayli, Khabib, MD   pantoprazole (PROTONIX) injection 40 mg, 40 mg, Intravenous, Q12H, Rust-Chester, Britton L, NP, 40 mg at 12/04/23 1011   piperacillin-tazobactam (ZOSYN) IVPB 3.375 g, 3.375 g, Intravenous, Q8H, Ezequiel Essex, NP, Last Rate: 12.5 mL/hr at 12/04/23 1407, 3.375 g at 12/04/23 1407   polyethylene glycol (MIRALAX / GLYCOLAX) packet 17 g, 17 g, Per Tube, Daily, Assaker, [place] Bali, MD, 17 g at 12/04/23 1225   propofol (DIPRIVAN) 1000 MG/100ML infusion, 5-80 mcg/kg/min, Intravenous, Titrated, Dgayli, Khabib, MD, Last Rate: 9.51 mL/hr at 12/04/23 1100, 25 mcg/kg/min at 12/04/23 1100   QUEtiapine (SEROQUEL) tablet 50 mg, 50 mg, Per Tube, BID, Rust-Chester, Micheline Rough L, NP, 50 mg at 12/04/23 1011   senna (SENOKOT) tablet 8.6 mg, 1 tablet, Per Tube, Daily, Assaker, [place] Bali, MD, 8.6 mg at 12/04/23 1225   sodium chloride flush (NS) 0.9 % injection 10-40 mL, 10-40 mL, Intracatheter, Q12H, Karna Christmas, Fuad, MD, 10 mL at 12/04/23 1012   sodium chloride flush (NS) 0.9 % injection 10-40 mL, 10-40 mL, Intracatheter, PRN, Vida Rigger, MD   valproic acid (DEPAKENE) 250 MG/5ML solution 500 mg, 500 mg, Per Tube, Daily, Lowella Bandy, RPH, 500 mg at 12/04/23 1012   valproic acid (DEPAKENE) 250 MG/5ML solution 750 mg, 750 mg, Per Tube, QHS, Lowella Bandy, RPH, 750 mg at 12/03/23 2147       ALLERGIES: Allergies  No Known Allergies       BIRTH HX:  Term -  no complications.     SOCIAL HISTORY: Social History         Socioeconomic History   Marital status: Single      Spouse name: Not on file   Number of children: Not on file   Years of education: Not on file   Highest education level: Not on file  Occupational History   Not on file  Tobacco Use   Smoking status: Never   Smokeless tobacco: Never  Substance and Sexual Activity   Alcohol use: Not Currently   Drug use: Never   Sexual  activity: Not Currently  Other Topics Concern   Not on file  Social History Narrative   Not on file    Social Drivers of Health        Financial Resource Strain: Low Risk  (12/02/2021)    Received from [facility]    Overall Financial Resource Strain (CARDIA)     Difficulty of Paying Living Expenses: Not very hard  Food Insecurity: Patient Unable To Answer (11/20/2023)    Hunger Vital Sign     Worried About Running Out of Food in the Last Year: Patient unable to answer     Ran Out of Food in the Last Year: Patient unable to answer  Transportation Needs: Patient Unable To Answer (11/20/2023)    PRAPARE - Transportation     Lack of Transportation (Medical): Patient unable to answer     Lack of Transportation (Non-Medical): Patient unable to answer  Physical Activity: Not on file  Stress: Not on file  Social Connections: Not on file  Intimate Partner Violence: Patient Unable To Answer (11/20/2023)    Humiliation, Afraid, Rape, and Kick questionnaire     Fear of Current or Ex-Partner: Patient unable to answer     Emotionally Abused: Patient unable to answer     Physically Abused: Patient unable to answer     Sexually Abused: Patient unable to answer        FAMILY HISTORY: History reviewed. No pertinent family history.         PHYSICAL EXAM: BP (!) 86/54   Pulse (!) 106   Temp 98 F (36.7 C) (Oral)   Resp 20   Ht 5\' 9"  (1.753 m)   Wt 63.1 kg   SpO2 100%   BMI 20.54 kg/m     The patient is intubated - no accute distress.      There was no respiratory distress, stridor or retractions.     Skin exam of the scalp, face and neck appeared normal.   The ear exam revealed patent external auditory canals and tympanic membranes to be clear bilaterally without perforation, drainage, middle ear fluid or evidence of acute infection.     The nose was patent bilaterally with no purulent drainage, polyps or mass lesions.  The nasal septum appeared midline.   Oral cavity exam revealed no mass or mucosal lesions, erythema or exudate.     The neck was nontender, without palpable adenopathy or mass lesion.  Thin neck with evidence of healed prior trach site.  Landmarks palpable.   Neurologic exam revealed cranial nerves II-XII to be grossly intact and symmetic, including the 7th cranial nerve, which was intact and symmetric bilaterally.    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  Anticipate return to the ICU postop as well as tracheostomy tube change by ENT on POD 5.     I discussed the risks, benefits and options for the proposed procedure with the family (Mom) at length today - she understands and agree to proceed.     I discussed with her as well, that we'd be quite careful and perhaps  avoid decannulation this time as the trach tube was likely helping and we wouldn't want to remove and replace again going forward if at all possible.     PICU team to hold Lovenox and check coags preop.  They will also hold any tube feeds after MN prior to surgery.  Consult signed, witnessed and on chart.  Greatly appreciate their assistance.   -----   Update 12/06/23:  28 yo gentleman with TBI  and seizure disorder s/p prior trach at [facility] s/p decannulation.  Now reintubated with ventilatory dependence and failure to wean / extubate.  ICU team requesting trach replacement and PEG tube.     Patient preop for revision tracheotomy tomorrow.  Consent on chart after discussion of risks, benefits, and options with Mother who agrees to proceed.  HCT = 28.4; platelets = 353.  PT/INR = 13.4/1.0.  Last PTT was 46 (elevated) on 2/10, but ICU team to hold any Lovenox and/or Heparin preop (at least 6 hours).  Anticipate correction prior to surgery - will check.       Magnus Ivan. Okey Dupre, MD, MBA, [facility] Otolaryngology-Head & Neck Surgery   [facility] [phone]

## 2023-12-06 NOTE — Plan of Care (Signed)
  Problem: Clinical Measurements: Goal: Signs and symptoms of infection will decrease Outcome: Progressing   Problem: Respiratory: Goal: Ability to maintain adequate ventilation will improve Outcome: Progressing   Problem: Respiratory: Goal: Ability to maintain a clear airway and adequate ventilation will improve Outcome: Progressing   Problem: Clinical Measurements: Goal: Cardiovascular complication will be avoided Outcome: Progressing   Problem: Nutrition: Goal: Adequate nutrition will be maintained Outcome: Progressing   Problem: Pain Managment: Goal: General experience of comfort will improve and/or be controlled Outcome: Progressing   Problem: Role Relationship: Goal: Method of communication will improve Outcome: Not Progressing   Problem: Education: Goal: Knowledge of General Education information will improve Description: Including pain rating scale, medication(s)/side effects and non-pharmacologic comfort measures Outcome: Not Progressing   Problem: Health Behavior/Discharge Planning: Goal: Ability to manage health-related needs will improve Outcome: Not Progressing

## 2023-12-06 NOTE — Interval H&P Note (Signed)
History and Physical Interval Note:  12/06/2023 11:59 AM  Alex [name]  has presented today for surgery, with the diagnosis of ventilatory dependence / failure to wean.  The various methods of treatment have been discussed with the patient and family. After consideration of risks, benefits and other options for treatment, the patient has consented to  Procedure(s): TRACHEOSTOMY (N/A) as a surgical intervention.  The patient's history has been reviewed, patient examined, no change in status, stable for surgery.  I have reviewed the patient's chart and labs.  Questions were answered to the patient's satisfaction.     Reola Mosher S  No changes to H&P.   Magnus Ivan. Okey Dupre, MD, MBA, [facility] Otolaryngology-Head & Neck Surgery [facility] [phone]

## 2023-12-07 ENCOUNTER — Encounter
Admission: EM | Disposition: A | Discharge status: Nursing Home (Includes ICF) | Payer: Self-pay | Source: Other Acute Inpatient Hospital | Attending: Internal Medicine

## 2023-12-07 ENCOUNTER — Inpatient Hospital Stay: Payer: Self-pay | Admitting: Anesthesiology

## 2023-12-07 ENCOUNTER — Other Ambulatory Visit: Payer: Self-pay

## 2023-12-07 DIAGNOSIS — J9601 Acute respiratory failure with hypoxia: Secondary | ICD-10-CM | POA: Diagnosis not present

## 2023-12-07 DIAGNOSIS — J690 Pneumonitis due to inhalation of food and vomit: Secondary | ICD-10-CM | POA: Diagnosis not present

## 2023-12-07 HISTORY — PX: TRACHEOSTOMY TUBE PLACEMENT: SHX814

## 2023-12-07 LAB — RENAL FUNCTION PANEL
Albumin: 2.2 g/dL — ABNORMAL LOW (ref 3.5–5.0)
Anion gap: 8 (ref 5–15)
BUN: 17 mg/dL (ref 6–20)
CO2: 28 mmol/L (ref 22–32)
Calcium: 8.6 mg/dL — ABNORMAL LOW (ref 8.9–10.3)
Chloride: 105 mmol/L (ref 98–111)
Creatinine, Ser: 0.5 mg/dL — ABNORMAL LOW (ref 0.61–1.24)
GFR, Estimated: >60 mL/min (ref >60)
Glucose, Bld: 88 mg/dL (ref 70–99)
Phosphorus: 4.1 mg/dL (ref 2.5–4.6)
Potassium: 3.9 mmol/L (ref 3.5–5.1)
Sodium: 141 mmol/L (ref 135–145)

## 2023-12-07 LAB — GLUCOSE, CAPILLARY
Glucose-Capillary: 106 mg/dL — ABNORMAL HIGH (ref 70–99)
Glucose-Capillary: 118 mg/dL — ABNORMAL HIGH (ref 70–99)
Glucose-Capillary: 122 mg/dL — ABNORMAL HIGH (ref 70–99)
Glucose-Capillary: 202 mg/dL — ABNORMAL HIGH (ref 70–99)
Glucose-Capillary: 68 mg/dL — ABNORMAL LOW (ref 70–99)
Glucose-Capillary: 69 mg/dL — ABNORMAL LOW (ref 70–99)
Glucose-Capillary: 76 mg/dL (ref 70–99)
Glucose-Capillary: 94 mg/dL (ref 70–99)

## 2023-12-07 LAB — CBC
HCT: 31.1 % — ABNORMAL LOW (ref 39.0–52.0)
Hemoglobin: 10.1 g/dL — ABNORMAL LOW (ref 13.0–17.0)
MCH: 31.4 pg (ref 26.0–34.0)
MCHC: 32.5 g/dL (ref 30.0–36.0)
MCV: 96.6 fL (ref 80.0–100.0)
Platelets: 349 10*3/uL (ref 150–400)
RBC: 3.22 MIL/uL — ABNORMAL LOW (ref 4.22–5.81)
RDW: 13.4 % (ref 11.5–15.5)
WBC: 4.7 10*3/uL (ref 4.0–10.5)
nRBC: 0 % (ref 0.0–0.2)

## 2023-12-07 LAB — PROTIME-INR
INR: 1 (ref 0.8–1.2)
Prothrombin Time: 13.8 s (ref 11.4–15.2)

## 2023-12-07 LAB — APTT: aPTT: 40 s — ABNORMAL HIGH (ref 24–36)

## 2023-12-07 LAB — MAGNESIUM: Magnesium: 2 mg/dL (ref 1.7–2.4)

## 2023-12-07 SURGERY — CREATION, TRACHEOSTOMY
Anesthesia: General | Site: Throat

## 2023-12-07 MED ORDER — MIDAZOLAM HCL 2 MG/2ML IJ SOLN
INTRAMUSCULAR | Status: AC
  Filled 2023-12-07: qty 2

## 2023-12-07 MED ORDER — DEXAMETHASONE SODIUM PHOSPHATE 10 MG/ML IJ SOLN
INTRAMUSCULAR | Status: AC
  Filled 2023-12-07: qty 1

## 2023-12-07 MED ORDER — DEXTROSE 50 % IV SOLN
25.0000 mL | Freq: Once | INTRAVENOUS | Status: AC
  Administered 2023-12-07: 25 mL via INTRAVENOUS
  Filled 2023-12-07: qty 50

## 2023-12-07 MED ORDER — PHENYLEPHRINE HCL (PRESSORS) 10 MG/ML IV SOLN
INTRAVENOUS | Status: DC | PRN
  Administered 2023-12-07: 80 ug via INTRAVENOUS

## 2023-12-07 MED ORDER — ORAL CARE MOUTH RINSE: 15.0000 mL | OROMUCOSAL | Status: DC | PRN

## 2023-12-07 MED ORDER — KETAMINE HCL 50 MG/5ML IJ SOSY
PREFILLED_SYRINGE | INTRAMUSCULAR | Status: AC
  Filled 2023-12-07: qty 5

## 2023-12-07 MED ORDER — PROPOFOL 10 MG/ML IV BOLUS
INTRAVENOUS | Status: AC
  Filled 2023-12-07: qty 20

## 2023-12-07 MED ORDER — 0.9 % SODIUM CHLORIDE (POUR BTL) OPTIME
TOPICAL | Status: DC | PRN
  Administered 2023-12-07: 1000 mL

## 2023-12-07 MED ORDER — ORAL CARE MOUTH RINSE
15.0000 mL | OROMUCOSAL | Status: DC
  Administered 2023-12-07 – 2023-12-23 (×187): 15 mL via OROMUCOSAL

## 2023-12-07 MED ORDER — SODIUM CHLORIDE 0.9 % IV SOLN: INTRAVENOUS | Status: DC | PRN

## 2023-12-07 MED ORDER — FENTANYL CITRATE (PF) 100 MCG/2ML IJ SOLN
INTRAMUSCULAR | Status: DC | PRN
  Administered 2023-12-07: 50 ug via INTRAVENOUS

## 2023-12-07 MED ORDER — LIDOCAINE-EPINEPHRINE 1 %-1:100000 IJ SOLN
INTRAMUSCULAR | Status: AC
  Filled 2023-12-07: qty 1

## 2023-12-07 MED ORDER — DEXTROSE 50 % IV SOLN: 12.5000 g | INTRAVENOUS | Status: AC

## 2023-12-07 MED ORDER — DEXTROSE 5 % IV SOLN: INTRAVENOUS | Status: DC

## 2023-12-07 MED ORDER — ROCURONIUM BROMIDE 10 MG/ML (PF) SYRINGE
PREFILLED_SYRINGE | INTRAVENOUS | Status: AC
Start: 2023-12-07 — End: ?
  Filled 2023-12-07: qty 10

## 2023-12-07 MED ORDER — DEXTROSE 50 % IV SOLN
INTRAVENOUS | Status: AC
  Administered 2023-12-07: 12.5 g via INTRAVENOUS
  Filled 2023-12-07: qty 50

## 2023-12-07 MED ORDER — FENTANYL CITRATE (PF) 100 MCG/2ML IJ SOLN
INTRAMUSCULAR | Status: AC
  Filled 2023-12-07: qty 2

## 2023-12-07 MED ORDER — ROCURONIUM BROMIDE 100 MG/10ML IV SOLN
INTRAVENOUS | Status: DC | PRN
  Administered 2023-12-07: 10 mg via INTRAVENOUS
  Administered 2023-12-07: 30 mg via INTRAVENOUS

## 2023-12-07 MED ORDER — LIDOCAINE-EPINEPHRINE 1 %-1:100000 IJ SOLN
INTRAMUSCULAR | Status: DC | PRN
  Administered 2023-12-07: 6 mL

## 2023-12-07 MED ORDER — MIDAZOLAM HCL 2 MG/2ML IJ SOLN
INTRAMUSCULAR | Status: DC | PRN
  Administered 2023-12-07: 1 mg via INTRAVENOUS

## 2023-12-07 SURGICAL SUPPLY — 34 items
BLADE SURG 15 STRL LF DISP TIS (BLADE) ×1 IMPLANT
BLADE SURG SZ11 CARB STEEL (BLADE) ×1 IMPLANT
CORD BIP STRL DISP 12FT (MISCELLANEOUS) IMPLANT
ELECT CAUTERY BLADE TIP 2.5 (TIP) ×1 IMPLANT
ELECT CAUTERY NDL 2.0 MIC (NEEDLE) ×1 IMPLANT
ELECT CAUTERY NEEDLE 2.0 MIC (NEEDLE) ×1 IMPLANT
ELECT REM PT RETURN 9FT ADLT (ELECTROSURGICAL) ×1 IMPLANT
ELECTRODE CAUTERY BLDE TIP 2.5 (TIP) ×1 IMPLANT
ELECTRODE REM PT RTRN 9FT ADLT (ELECTROSURGICAL) ×1 IMPLANT
FORCEPS JEWEL BIP 4-3/4 STR (INSTRUMENTS) IMPLANT
GAUZE 4X4 16PLY ~~LOC~~+RFID DBL (SPONGE) ×1 IMPLANT
GLOVE BIO SURGEON STRL SZ7.5 (GLOVE) ×1 IMPLANT
GOWN STRL REUS W/ TWL LRG LVL3 (GOWN DISPOSABLE) ×3 IMPLANT
HEMOSTAT SURGICEL 2X3 (HEMOSTASIS) IMPLANT
HLDR TRACH TUBE NECKBAND 18 (MISCELLANEOUS) ×1 IMPLANT
LABEL OR SOLS (LABEL) ×1 IMPLANT
MANIFOLD NEPTUNE II (INSTRUMENTS) ×1 IMPLANT
NS IRRIG 500ML POUR BTL (IV SOLUTION) ×1 IMPLANT
PACK HEAD/NECK (MISCELLANEOUS) ×1 IMPLANT
SHEARS HARMONIC 9CM CVD (BLADE) IMPLANT
SOL PREP PVP 2OZ (MISCELLANEOUS) ×1 IMPLANT
SOLUTION PREP PVP 2OZ (MISCELLANEOUS) ×1 IMPLANT
SPONGE DRAIN TRACH 4X4 STRL 2S (GAUZE/BANDAGES/DRESSINGS) ×1 IMPLANT
SPONGE KITTNER 5P (MISCELLANEOUS) ×1 IMPLANT
SPONGE VERSALON 4X4 4PLY (MISCELLANEOUS) ×1 IMPLANT
STAPLER SKIN PROX 35W (STAPLE) ×1 IMPLANT
SUCTION TUBE FRAZIER 10FR DISP (SUCTIONS) ×1 IMPLANT
SUT SILK 2 0 SH (SUTURE) ×2 IMPLANT
SUT SILK 2-0 18XBRD TIE 12 (SUTURE) IMPLANT
SUT VIC AB 4-0 PS2 18 (SUTURE) ×1 IMPLANT
TRAP FLUID SMOKE EVACUATOR (MISCELLANEOUS) ×1 IMPLANT
TUBE TRACH 6.0 CUFF FLEX (MISCELLANEOUS) IMPLANT
TUBE TRACH FLEX 8.5 CUFF (MISCELLANEOUS) IMPLANT
WATER STERILE IRR 500ML POUR (IV SOLUTION) ×1 IMPLANT

## 2023-12-07 NOTE — Transfer of Care (Signed)
Immediate Anesthesia Transfer of Care Note  Patient: Alex [name]  Procedure(s) Performed: TRACHEOSTOMY (Throat)  Patient Location: PACU and ICU  Anesthesia Type:General  Level of Consciousness: sedated  Airway & Oxygen Therapy: Patient placed on Ventilator (see vital sign flow sheet for setting)  Post-op Assessment: Report given to RN and Post -op Vital signs reviewed and stable  Post vital signs: stable  Last Vitals:  Vitals Value Taken Time  BP    Temp    Pulse    Resp    SpO2      Last Pain:  Vitals:   12/07/23 0400  TempSrc: Oral  PainSc:          Complications: No notable events documented.

## 2023-12-07 NOTE — Progress Notes (Signed)
Patient to OR

## 2023-12-07 NOTE — Interval H&P Note (Signed)
History and Physical Interval Note:  12/07/2023 7:25 AM  Alex [name]  has presented today for surgery, with the diagnosis of ventilatory dependence / failure to wean.  The various methods of treatment have been discussed with the patient and family. After consideration of risks, benefits and other options for treatment, the patient has consented to  Procedure(s): TRACHEOSTOMY (N/A) as a surgical intervention.  The patient's history has been reviewed, patient examined, no change in status, stable for surgery.  I have reviewed the patient's chart and labs.  Questions were answered to the patient's satisfaction.     Lanell Persons

## 2023-12-07 NOTE — Progress Notes (Signed)
Pt removed from ventilator for transport to OR

## 2023-12-07 NOTE — Plan of Care (Signed)
  Problem: Fluid Volume: Goal: Hemodynamic stability will improve Outcome: Progressing   Problem: Respiratory: Goal: Ability to maintain adequate ventilation will improve Outcome: Progressing   Problem: Respiratory: Goal: Ability to maintain a clear airway and adequate ventilation will improve Outcome: Progressing   Problem: Clinical Measurements: Goal: Respiratory complications will improve Outcome: Progressing Goal: Cardiovascular complication will be avoided Outcome: Progressing   Problem: Nutrition: Goal: Adequate nutrition will be maintained Outcome: Progressing   Problem: Pain Managment: Goal: General experience of comfort will improve and/or be controlled Outcome: Progressing   Problem: Role Relationship: Goal: Method of communication will improve Outcome: Not Progressing   Problem: Health Behavior/Discharge Planning: Goal: Ability to manage health-related needs will improve Outcome: Not Progressing   Problem: Role Relationship: Goal: Method of communication will improve Outcome: Not Progressing

## 2023-12-07 NOTE — Progress Notes (Signed)
NAME:  Alex [name], MRN:  [id number], DOB:  08/29/1996, LOS: 18 ADMISSION DATE:  11/19/2023, CONSULTATION DATE:  11/20/23 REFERRING MD:  Dr. Arville Care, CHIEF COMPLAINT:  AMS   Brief Pt Description / Synopsis:  28 y.o. male with PMHx significant for chronic TBI with left sided hemiparesis admitted with Acute Metabolic Encephalopathy and Acute Hypoxic Respiratory Failure in the setting of aspiration and Pseudomonas and MRSA Pneumonia requiring intubation and mechanical ventilation. With recurrent aspiration events, to undergo Tracheostomy placement with ENT.  History of Present Illness:  28 yo M presenting to [facility] ED from outpatient rehab on 11/19/23 for evaluation of altered mental status.   History obtained per chart review and mother's telephone interview, patient unable to participate in interview due to respiratory distress and baseline TBI. 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 He was treated at [facility] for 5 months then discharged to rehab. Per his mother and legal guardian his baseline is: Non verbal but he will yell out sporadic nonsensical words with left sided paralysis. He is able to move his RUE in order to feed himself with finger foods and he has some involuntary movement with that arm, he will swat at you. Similar baseline function with RLE, he can kick and move, but often will lay it bent and to the side. He has enough strength to even try and get out of bed on the right side. She denies any issues with swallowing, but eats mostly soft food. If he is not watched closely with eating he will try to eat all the food at once. The facility staff reported he was at his normal baseline until lunchtime on 11/19/23. It was observed he was more somnolent and not interactive. Staff noted a cough and slightly increased work of breathing. Mom also confirmed noting a congested cough the last time she visited earlier in the week. Staff and mom denied nausea/ vomiting, but mom  reports chronic loose stools.   EMS reported the patient febrile and tachycardic on arrival.   ED course: Upon arrival patient tachycardic and lethargic. Sepsis protocol initiated with antibiotics and IVF resuscitation. Labs significant for mild hypokalemia, otherwise WNL. Initial imaging unremarkable but then patient became hypoxic with SpO2 85% on RA and a CTa was obtained, negative for PE but concerning for aspiration pneumonia. TRH consulted for admission. While patient was pending admission in ED he became increasingly hypoxic in the 60's, placed on a NRB without improvement. PCCM alerted the need to emergently intubate the patient. Medications given: Cefepime, Flagyl, Vancomycin, 2 L IVF bolus, IV contrast Initial Vitals: 97.9, 17, 130, 119/82 & 92 on RA Significant labs: (Labs/ Imaging personally reviewed) EKG : pending Chemistry: Na+: 138, K+: 3.4, BUN/Cr.: 13/ 0.65, Serum CO2/ AG: 26/8 Hematology: WBC: 4.3, Hgb: 14.3,  Lactic/ PCT: 1.1 > 1.4 / pending, COVID-19 & Influenza A/B: negative   ABG: pending post intubation   CXR 11/19/23: no active disease CT head wo contrast 11/19/23: No evidence of acute intracranial abnormality. Stable multifocal encephalomalacia, likely the sequela of prior trauma. Similar age-advanced cerebral atrophy CT angio chest PE 11/19/23:  No pulmonary embolus. Near complete filling of the right mainstem bronchus with filling extending into the bronchus intermedius, right upper, middle and lower lobe bronchi. Layering debris in the trachea. Findings are highly suspicious for aspiration. Minimal ill-defined patchy and nodular airspace disease in the dependent right lower lobe, likely postobstructive pneumonia.   PCCM consulted for assistance in management and  monitoring due to acute hypoxic respiratory failure requiring urgent intubation and mechanical ventilatory support secondary to suspected aspiration and pneumonia.  Please see "Significant Hospital Events"  section below for full detailed hospital course.   Pertinent  Medical History  Seizures TBI (09/2019)  Micro Data:  1/27: SARS-CoV-2/flu/RSV PCR>> negative 1/27: Blood culture x 2>> no growth 1/28: MRSA PCR>>Positive  1/28: Tracheal aspirate>>Pseudomonas aeruginosa and Staphylococcus aureus 1/28: HIV screen>> nonreactive 1/29: BAL>>Pseudomonas aeruginosa & MRSA 2/07: Cdiff>>negative   Antimicrobials:   Anti-infectives (From admission, onward)    Start     Dose/Rate Route Frequency Ordered Stop   12/05/23 1443  ceFAZolin (ANCEF) IVPB 1 g/50 mL premix        over 30 Minutes  Continuous PRN 12/05/23 1443 12/05/23 1443   12/05/23 0000  ceFAZolin (ANCEF) IVPB 2g/100 mL premix        2 g 200 mL/hr over 30 Minutes Intravenous To Radiology 12/04/23 1324 12/05/23 0041   12/01/23 2200  vancomycin (VANCOREADY) IVPB 750 mg/150 mL  Status:  Discontinued        750 mg 150 mL/hr over 60 Minutes Intravenous Every 8 hours 12/01/23 1205 12/01/23 1215   12/01/23 1400  vancomycin (VANCOREADY) IVPB 1250 mg/250 mL  Status:  Discontinued        1,250 mg 166.7 mL/hr over 90 Minutes Intravenous  Once 12/01/23 1205 12/01/23 1215   12/01/23 1315  linezolid (ZYVOX) IVPB 600 mg        600 mg 300 mL/hr over 60 Minutes Intravenous Every 12 hours 12/01/23 1215 12/07/23 2359   12/01/23 0230  piperacillin-tazobactam (ZOSYN) IVPB 3.375 g        3.375 g 12.5 mL/hr over 240 Minutes Intravenous Every 8 hours 12/01/23 0131 12/08/23 0759   11/30/23 1500  levofloxacin (LEVAQUIN) IVPB 750 mg  Status:  Discontinued        750 mg 100 mL/hr over 90 Minutes Intravenous Every 24 hours 11/30/23 0345 12/01/23 0048   11/28/23 0100  vancomycin (VANCOREADY) IVPB 1250 mg/250 mL       Placed in "Followed by" Linked Group   1,250 mg 166.7 mL/hr over 90 Minutes Intravenous Every 12 hours 11/27/23 1153 11/29/23 1331   11/27/23 1300  vancomycin (VANCOREADY) IVPB 1500 mg/300 mL       Placed in "Followed by" Linked Group   1,500  mg 150 mL/hr over 120 Minutes Intravenous  Once 11/27/23 1153 11/27/23 1516   11/23/23 1500  levofloxacin (LEVAQUIN) IVPB 750 mg        750 mg 100 mL/hr over 90 Minutes Intravenous Every 24 hours 11/23/23 1341 11/29/23 1551   11/21/23 2200  doxycycline (VIBRAMYCIN) 100 mg in sodium chloride 0.9 % 250 mL IVPB  Status:  Discontinued        100 mg 125 mL/hr over 120 Minutes Intravenous Every 12 hours 11/21/23 1425 11/27/23 1148   11/21/23 2000  metroNIDAZOLE (FLAGYL) IVPB 500 mg  Status:  Discontinued        500 mg 100 mL/hr over 60 Minutes Intravenous Every 12 hours 11/21/23 1425 11/24/23 1227   11/20/23 1200  Ampicillin-Sulbactam (UNASYN) 3 g in sodium chloride 0.9 % 100 mL IVPB  Status:  Discontinued        3 g 200 mL/hr over 30 Minutes Intravenous Every 6 hours 11/20/23 1131 11/21/23 1423   11/20/23 0800  cefTRIAXone (ROCEPHIN) 2 g in sodium chloride 0.9 % 100 mL IVPB  Status:  Discontinued        2  g 200 mL/hr over 30 Minutes Intravenous Every 24 hours 11/19/23 2322 11/20/23 0229   11/20/23 0100  azithromycin (ZITHROMAX) 500 mg in sodium chloride 0.9 % 250 mL IVPB  Status:  Discontinued        500 mg 250 mL/hr over 60 Minutes Intravenous Every 24 hours 11/19/23 2322 11/21/23 1423   11/19/23 1545  ceFEPIme (MAXIPIME) 2 g in sodium chloride 0.9 % 100 mL IVPB        2 g 200 mL/hr over 30 Minutes Intravenous  Once 11/19/23 1541 11/19/23 1630   11/19/23 1545  metroNIDAZOLE (FLAGYL) IVPB 500 mg        500 mg 100 mL/hr over 60 Minutes Intravenous  Once 11/19/23 1541 11/19/23 1748   11/19/23 1545  vancomycin (VANCOCIN) IVPB 1000 mg/200 mL premix        1,000 mg 200 mL/hr over 60 Minutes Intravenous  Once 11/19/23 1541 11/19/23 1917      Significant Hospital Events: Including procedures, antibiotic start and stop dates in addition to other pertinent events   11/20/23: Admit to ICU due to acute hypoxic respiratory failure requiring urgent intubation and mechanical ventilatory support  secondary to suspected aspiration and pneumonia 11/21/23- patient moving RUE, mother at bedside we reviewed medical plan. He remains on 35mcg/kg/hr levophed, today plan to rescusitate more aggresively and wean from levophed and potentially extubate post SBP.   He is febrile this am.  He is on zithromax, unasyn 11/22/23- s/p bronch yesterday with aspiration of mucus plugging.  Today resp status improved with liberation protocol in proces. SLP post extubation today. 11/23/23- patient is +for pseudomonas resp cultures.  He is also MRSA pcr +, refined therapy during rounds with pharmacist. He remains on MV 11/24/23- patient is still on vasopressor support weaning down on MV.  Secretions are slightly better.  11/25/23- patient weaned off levophed, failed SBT with tachypnea, tachycardia.  Secretions much improved. 11/26/23- on minimal vent support, unable to perform SBT due to copious secretions from ETT.   11/27/23- on minimal vent support, secretions improved.  Change Doxycycline to Vancomycin.  Plan for SBT as tolerated. 11/28/23-Pt successfully extubated 02/4, currently tolerating HHFNC @35L /35%.  Required low dose precedex overnight to allow suctioning due to excessive secretions. Pt with suspected seizure activity developed tremors in the right upper extremity and became minimally responsive.  Received 1g of iv keppra.    11/29/23-Overnight pt developed sinus tachycardia/svt 140 to 160's improved following 2.5 mg iv metoprolol.  Tolerating RA with no signs of respiratory distress.  Transferring to progressive care unit TRH to pick on 02/7 12/01/23: Pt transferred back to ICU with severe acute hypoxic respiratory failure initially placed on HHFNC but due to significant hypoxia O2 sats in the 60's he required reintubation and underwent emergent bronchoscopy which revealed thick secretions in the LUL and LLL resulting in mucous plugging. Therapeutic aspiration performed. CXR showed collapse of the LUL secondary to mucus  plugging  12/03/23: Pt remains mechanically intubated on minimal vent settings.  ENT consulted for tracheostomy placement due to recurrent respiratory failure due to inability to clear secretions.  Requiring levophed gtt to maintain map 65 or higher  12/04/23: No acute events overnight on minimal settings pending tracheostomy placement  12/05/23: No acute events overnight, on minimal vent support. Initially on low dose Levophed, now weaned off.  IR placed PEG tube. Pending Tracheostomy placement on Friday. 12/06/23: No acute events overnight, on minimal vent support. Awaiting Tracheostomy placement tomorrow. 12/07/23: No acute events overnight.  Tracheostomy  placed this morning by ENT, no events with procedure. Will keep sedated today with newly placed Trach with plan for WUA/SBT tomorrow.  Interim History / Subjective:  As outlined above under significant events   Objective   Blood pressure 95/63, pulse 84, temperature 97.9 F (36.6 C), temperature source Oral, resp. rate 20, height 5\' 9"  (1.753 m), weight 63.3 kg, SpO2 100%.    Vent Mode: PRVC FiO2 (%):  [28 %] 28 % Set Rate:  [20 bmp] 20 bmp Vt Set:  [450 mL] 450 mL PEEP:  [8 cmH20] 8 cmH20   Intake/Output Summary (Last 24 hours) at 12/07/2023 1610 Last data filed at 12/07/2023 9604 Gross per 24 hour  Intake 3284.52 ml  Output 1610 ml  Net 1674.52 ml   Filed Weights   12/05/23 0500 12/06/23 0228 12/07/23 0230  Weight: 63.2 kg 63.3 kg 63.3 kg    Examination: General: Acute on chronically-ill appearing frail male, NAD mechanically intubated   HENT: Atraumatic, normocephalic, neck supple, no JVD, Tracheostomy in place clean dry and intact Lungs: Clear throughout, even, non labored, synchronous with the vent Cardiovascular: Sinus tachycardia, s1s2, no m/r/g, 2+ radial/2+ distal pulses, trace generalized edema  Abdomen: +BS x4, soft, non distended  Extremities: Left upper and lower extremities contractured, bilateral foot drop Neuro:  Sedated, Left-sided hemiparesis with left arm contractured (baseline), opens eyes to voice and tracks GU: Indwelling foley catheter in place draining yellow urine pending removal   Resolved Hospital Problem list     Assessment & Plan:   #Sinus tachycardia/SVT  #Hypotension secondary to sedating medication and possible recurrent sepsis  -Continuous cardiac monitoring -Maintain MAP >65 -IV fluids -Vasopressors as needed to maintain MAP goal -Lactic acid is normalized -HS Troponin negative x2  #Acute hypoxic respiratory failure secondary to aspiration, LUL/LLL mucous plugging, and collapse of the LUL due to mucous plugging in the setting of TBI s/p emergent bronchoscopy 02/8 #Mechanical ventilation  Status post Tracheostomy placement with ENT on 12/06/22 - Full vent support for now: vent settings reviewed and established  - Continue lung protective strategies  - Maintain plateau pressures less than 30 cm H2O - Maintain O2 sats 92% or higher  - Prn bronchodilator therapy  - Aggressive pulmonary hygiene - ABX as above  #Pseudomonas & MRSA pneumonia #Aspiration pneumonia  - Trend WBC and monitor fever curve  - Continue zosyn and linezolid with completion date 12/07/23 per ID recommendations   #Chronic Seizure Disorder #Chronic TBI #Sedation needs in setting of mechanical ventilation EEG 02/10: obtained while sedated on propofol and comatose and is abnormal due to severe diffuse slowing indicative of global cerebral dysfunction, medication effect, or both. Epileptiform abnormalities were not seen during this recording.  -Maintain a RASS goal of 0 to -1 -Dilaudid and Propofol as needed to maintain RASS goal -Avoid sedating medications as able -Daily wake up assessment -Continue outpatient vimpat and valproic acid  -Continue outpatient baclofen  -Continue seroquel  -Seizure precautions   #Dysphagia -PEG tube placed by IR -Continue Tube feeds     Best Practice (right click  and "Reselect all SmartList Selections" daily)   Diet/type: resume tube feeds DVT prophylaxis: resume LMWH on 2/15 GI prophylaxis: PPI Lines: right internal jugular central line, and is still needed Foley: yes, and still needed Code Status:  full code Last date of multidisciplinary goals of care discussion [12/07/23]  2/14: Updated pts mother at bedside regarding pts condition and current plan of care.    Labs   CBC: Recent Labs  Lab 12/03/23 0431 12/04/23 0425 12/05/23 0344 12/06/23 0352 12/07/23 0334  WBC 8.7 5.6 4.5 4.3 4.7  NEUTROABS  --   --   --  2.3  --   HGB 10.6* 9.4* 9.4* 9.2* 10.1*  HCT 33.4* 28.6* 28.8* 28.4* 31.1*  MCV 98.2 94.7 95.7 96.3 96.6  PLT 417* 356 347 353 349    Basic Metabolic Panel: Recent Labs  Lab 12/03/23 0431 12/04/23 0425 12/05/23 0343 12/06/23 0352 12/07/23 0334  NA 138 137 140 138 141  K 4.7 3.5 3.6 3.2* 3.9  CL 100 100 101 101 105  CO2 26 26 27 26 28   GLUCOSE 101* 113* 94 107* 88  BUN 11 10 12 8 17   CREATININE 0.59* 0.58* 0.68 0.60* 0.50*  CALCIUM 8.4* 7.8* 8.4* 8.5* 8.6*  MG 2.6* 2.2 2.2 2.4 2.0  PHOS 4.7* 4.8* 5.2* 4.4 4.1   GFR: Estimated Creatinine Clearance: 123.1 mL/min (A) (by C-G formula based on SCr of 0.5 mg/dL (L)). Recent Labs  Lab 12/01/23 0555 12/01/23 1052 12/01/23 1414 12/01/23 1735 12/02/23 0458 12/04/23 0425 12/05/23 0344 12/06/23 0352 12/07/23 0334  PROCALCITON <0.10  --   --   --   --   --   --   --   --   WBC 13.2*  --   --   --    < > 5.6 4.5 4.3 4.7  LATICACIDVEN  --  3.2* 2.2* 1.9  --   --   --   --   --    < > = values in this interval not displayed.    Liver Function Tests: Recent Labs  Lab 12/02/23 0458 12/03/23 0431 12/07/23 0334  ALBUMIN 2.5* 2.6* 2.2*    No results for input(s): "LIPASE", "AMYLASE" in the last 168 hours. No results for input(s): "AMMONIA" in the last 168 hours.  ABG    Component Value Date/Time   PHART 7.44 12/01/2023 1143   PCO2ART 34 12/01/2023 1143    PO2ART 63 (L) 12/01/2023 1143   HCO3 23.1 12/01/2023 1143   TCO2 28 05/31/2021 0404   ACIDBASEDEF 0.5 12/01/2023 1143   O2SAT 93 12/01/2023 1143     Coagulation Profile: Recent Labs  Lab 12/03/23 1018 12/05/23 0344 12/07/23 0334  INR 1.0 1.0 1.0     Cardiac Enzymes: No results for input(s): "CKTOTAL", "CKMB", "CKMBINDEX", "TROPONINI" in the last 168 hours.  HbA1C: Hgb A1c MFr Bld  Date/Time Value Ref Range Status  04/11/2022 06:10 AM 5.1 4.8 - 5.6 % Final    Comment:    (NOTE) Pre diabetes:          5.7%-6.4%  Diabetes:              >6.4%  Glycemic control for   <7.0% adults with diabetes   05/31/2021 04:18 PM 4.9 4.8 - 5.6 % Final    Comment:    (NOTE) Pre diabetes:          5.7%-6.4%  Diabetes:              >6.4%  Glycemic control for   <7.0% adults with diabetes     CBG: Recent Labs  Lab 12/06/23 1631 12/06/23 1955 12/07/23 0006 12/07/23 0337 12/07/23 0420  GLUCAP 81 94 94 68* 202*    Review of Systems:   Unable to assess pt nonverbal/intubated/sedated  Past Medical History:  He,  has a past medical history of 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   Surgical History:   Past Surgical History:  Procedure Laterality Date   GASTROSTOMY TUBE PLACEMENT     IR GASTROSTOMY TUBE MOD SED  12/05/2023     Social History:   reports that he has never smoked. He has never used smokeless tobacco. He reports that he does not currently use alcohol. He reports that he does not use drugs.   Family History:  His family history is not on file.   Allergies No Known Allergies   Home Medications  Prior to Admission medications   Medication Sig Start Date End Date Taking? Authorizing Provider  baclofen (LIORESAL) 10 MG tablet Take 10 mg by mouth 3 (three) times daily.   Yes [provider]  diazePAM, 15 MG Dose, (VALTOCO 15 MG DOSE) 2 x 7.5 MG/0.1ML LQPK  Spray 7.5 mg into each nostril in the event of a seizure 04/20/22  Yes Zigmund Daniel., MD  gabapentin (NEURONTIN) 400 MG capsule Take 1 capsule (400 mg total) by mouth 3 (three) times daily. 04/20/22 11/20/23 Yes Zigmund Daniel., MD  lacosamide 100 MG TABS Take 1 tablet (100 mg total) by mouth 2 (two) times daily. 04/20/22 11/20/23 Yes Zigmund Daniel., MD  melatonin 5 MG TABS Take 10 mg by mouth at bedtime.   Yes [provider]  QUEtiapine (SEROQUEL XR) 200 MG 24 hr tablet Take 200 mg by mouth in the morning.   Yes [provider]  QUEtiapine (SEROQUEL) 25 MG tablet Take 25 mg by mouth at bedtime.   Yes [provider]  valproic acid (DEPAKENE) 250 MG capsule Take 4 capsules (1,000 mg total) by mouth 2 (two) times daily. Patient taking differently: Take 500 mg by mouth every morning. 04/20/22 11/20/23 Yes Zigmund Daniel., MD  valproic acid (DEPAKENE) 250 MG capsule Take 750 mg by mouth every evening.   Yes [provider]     Critical care time: 40 minutes     Harlon Ditty, AGACNP-BC [facility] Prefer epic messenger for cross cover needs If after hours, please call E-link

## 2023-12-07 NOTE — Plan of Care (Signed)
  Problem: Fluid Volume: Goal: Hemodynamic stability will improve Outcome: Progressing   Problem: Respiratory: Goal: Ability to maintain adequate ventilation will improve Outcome: Progressing   Problem: Activity: Goal: Risk for activity intolerance will decrease Outcome: Progressing   Problem: Nutrition: Goal: Adequate nutrition will be maintained Outcome: Progressing   Problem: Pain Managment: Goal: General experience of comfort will improve and/or be controlled Outcome: Progressing   Problem: Safety: Goal: Ability to remain free from injury will improve Outcome: Progressing
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
## 2023-12-07 NOTE — Anesthesia Postprocedure Evaluation (Signed)
Anesthesia Post Note  Patient: Alex [name]  Procedure(s) Performed: TRACHEOSTOMY (Throat)  Patient location during evaluation: SICU Anesthesia Type: General Level of consciousness: sedated Pain management: pain level controlled Vital Signs Assessment: post-procedure vital signs reviewed and stable Respiratory status: patient on ventilator - see flowsheet for VS Cardiovascular status: blood pressure returned to baseline Postop Assessment: no apparent nausea or vomiting Anesthetic complications: no   No notable events documented.   Last Vitals:  Vitals:   12/07/23 1145 12/07/23 1200  BP: (!) 86/55 120/80  Pulse:  (!) 107  Resp:  20  Temp:  36.6 C  SpO2:  100%    Last Pain:  Vitals:   12/07/23 1200  TempSrc: Oral  PainSc:                  Alex [name]

## 2023-12-08 ENCOUNTER — Inpatient Hospital Stay: Payer: Medicaid Other

## 2023-12-08 DIAGNOSIS — J690 Pneumonitis due to inhalation of food and vomit: Secondary | ICD-10-CM | POA: Diagnosis not present

## 2023-12-08 DIAGNOSIS — J9601 Acute respiratory failure with hypoxia: Secondary | ICD-10-CM | POA: Diagnosis not present

## 2023-12-08 LAB — BASIC METABOLIC PANEL
Anion gap: 6 (ref 5–15)
BUN: 16 mg/dL (ref 6–20)
CO2: 27 mmol/L (ref 22–32)
Calcium: 8.5 mg/dL — ABNORMAL LOW (ref 8.9–10.3)
Chloride: 108 mmol/L (ref 98–111)
Creatinine, Ser: 0.52 mg/dL — ABNORMAL LOW (ref 0.61–1.24)
GFR, Estimated: >60 mL/min (ref >60)
Glucose, Bld: 136 mg/dL — ABNORMAL HIGH (ref 70–99)
Potassium: 3.2 mmol/L — ABNORMAL LOW (ref 3.5–5.1)
Sodium: 141 mmol/L (ref 135–145)

## 2023-12-08 LAB — URINALYSIS, COMPLETE (UACMP) WITH MICROSCOPIC
Bacteria, UA: NONE SEEN
Bilirubin Urine: NEGATIVE
Glucose, UA: NEGATIVE mg/dL
Hgb urine dipstick: NEGATIVE
Ketones, ur: NEGATIVE mg/dL
Leukocytes,Ua: NEGATIVE
Nitrite: NEGATIVE
Protein, ur: NEGATIVE mg/dL
Specific Gravity, Urine: 1.021 (ref 1.005–1.030)
pH: 8 (ref 5.0–8.0)

## 2023-12-08 LAB — CBC
HCT: 30.4 % — ABNORMAL LOW (ref 39.0–52.0)
Hemoglobin: 9.6 g/dL — ABNORMAL LOW (ref 13.0–17.0)
MCH: 30.8 pg (ref 26.0–34.0)
MCHC: 31.6 g/dL (ref 30.0–36.0)
MCV: 97.4 fL (ref 80.0–100.0)
Platelets: 367 10*3/uL (ref 150–400)
RBC: 3.12 MIL/uL — ABNORMAL LOW (ref 4.22–5.81)
RDW: 13.3 % (ref 11.5–15.5)
WBC: 4.6 10*3/uL (ref 4.0–10.5)
nRBC: 0 % (ref 0.0–0.2)

## 2023-12-08 LAB — PROCALCITONIN: Procalcitonin: <0.1 ng/mL

## 2023-12-08 LAB — GLUCOSE, CAPILLARY
Glucose-Capillary: 138 mg/dL — ABNORMAL HIGH (ref 70–99)
Glucose-Capillary: 108 mg/dL — ABNORMAL HIGH (ref 70–99)
Glucose-Capillary: 105 mg/dL — ABNORMAL HIGH (ref 70–99)
Glucose-Capillary: 89 mg/dL (ref 70–99)
Glucose-Capillary: 103 mg/dL — ABNORMAL HIGH (ref 70–99)

## 2023-12-08 LAB — LACTIC ACID, PLASMA: Lactic Acid, Venous: 1.4 mmol/L (ref 0.5–1.9)

## 2023-12-08 LAB — TRIGLYCERIDES: Triglycerides: 184 mg/dL — ABNORMAL HIGH (ref <150)

## 2023-12-08 LAB — MAGNESIUM
Magnesium: 2 mg/dL (ref 1.7–2.4)
Magnesium: 2 mg/dL (ref 1.7–2.4)

## 2023-12-08 LAB — PHOSPHORUS: Phosphorus: 3.7 mg/dL (ref 2.5–4.6)

## 2023-12-08 LAB — POTASSIUM: Potassium: 4.2 mmol/L (ref 3.5–5.1)

## 2023-12-08 MED ORDER — VANCOMYCIN HCL 1250 MG/250ML IV SOLN
1250.0000 mg | Freq: Two times a day (BID) | INTRAVENOUS | Status: DC
  Administered 2023-12-08 – 2023-12-09 (×3): 1250 mg via INTRAVENOUS
  Filled 2023-12-08 (×4): qty 250

## 2023-12-08 MED ORDER — POTASSIUM CHLORIDE 20 MEQ PO PACK
40.0000 meq | PACK | Freq: Two times a day (BID) | ORAL | Status: AC
  Administered 2023-12-08 (×2): 40 meq
  Filled 2023-12-08 (×2): qty 2

## 2023-12-08 MED ORDER — OXYCODONE HCL 5 MG PO TABS
5.0000 mg | ORAL_TABLET | Freq: Four times a day (QID) | ORAL | Status: DC
  Administered 2023-12-08 – 2024-01-16 (×154): 5 mg
  Filled 2023-12-08 (×154): qty 1

## 2023-12-08 MED ORDER — METOPROLOL TARTRATE 25 MG PO TABS: 12.5000 mg | ORAL_TABLET | Freq: Two times a day (BID) | ORAL | Status: DC

## 2023-12-08 MED ORDER — METOPROLOL TARTRATE 5 MG/5ML IV SOLN
2.5000 mg | Freq: Four times a day (QID) | INTRAVENOUS | Status: DC | PRN
  Administered 2023-12-08 – 2023-12-10 (×6): 5 mg via INTRAVENOUS
  Filled 2023-12-08 (×7): qty 5

## 2023-12-08 MED ORDER — PIPERACILLIN-TAZOBACTAM 3.375 G IVPB
3.3750 g | Freq: Three times a day (TID) | INTRAVENOUS | Status: DC
  Administered 2023-12-08 – 2023-12-12 (×11): 3.375 g via INTRAVENOUS
  Filled 2023-12-08 (×11): qty 50

## 2023-12-08 MED ORDER — MIDAZOLAM HCL 2 MG/2ML IJ SOLN
1.0000 mg | Freq: Once | INTRAMUSCULAR | Status: AC
  Administered 2023-12-08: 1 mg via INTRAVENOUS
  Filled 2023-12-08: qty 2

## 2023-12-08 MED ORDER — METOPROLOL TARTRATE 25 MG PO TABS
12.5000 mg | ORAL_TABLET | Freq: Two times a day (BID) | ORAL | Status: DC
  Administered 2023-12-08 – 2023-12-10 (×5): 12.5 mg
  Filled 2023-12-08 (×5): qty 1

## 2023-12-08 MED ORDER — LACTATED RINGERS IV BOLUS
1000.0000 mL | Freq: Once | INTRAVENOUS | Status: AC
  Administered 2023-12-08: 1000 mL via INTRAVENOUS

## 2023-12-08 MED ORDER — FENTANYL CITRATE PF 50 MCG/ML IJ SOSY
25.0000 ug | PREFILLED_SYRINGE | Freq: Once | INTRAMUSCULAR | Status: AC
  Administered 2023-12-08: 25 ug via INTRAVENOUS
  Filled 2023-12-08: qty 1
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
## 2023-12-08 NOTE — Plan of Care (Signed)
  Problem: Fluid Volume: Goal: Hemodynamic stability will improve Outcome: Progressing   Problem: Clinical Measurements: Goal: Diagnostic test results will improve Outcome: Progressing Goal: Signs and symptoms of infection will decrease Outcome: Progressing   Problem: Respiratory: Goal: Ability to maintain adequate ventilation will improve Outcome: Progressing   Problem: Activity: Goal: Ability to tolerate increased activity will improve Outcome: Progressing   Problem: Clinical Measurements: Goal: Ability to maintain clinical measurements within normal limits will improve Outcome: Progressing   Problem: Activity: Goal: Ability to tolerate increased activity will improve Outcome: Progressing

## 2023-12-08 NOTE — Progress Notes (Signed)
 NAME:  Alex [name], MRN:  [id number], DOB:  Mar 11, 1996, LOS: 19 ADMISSION DATE:  11/19/2023, CONSULTATION DATE:  11/20/23 REFERRING MD:  Dr. Arville Care, CHIEF COMPLAINT:  AMS   Brief Pt Description / Synopsis:  28 y.o. male with PMHx significant for chronic TBI with left sided hemiparesis admitted with Acute Metabolic Encephalopathy and Acute Hypoxic Respiratory Failure in the setting of aspiration and Pseudomonas and MRSA Pneumonia requiring intubation and mechanical ventilation. With recurrent aspiration events, to undergo Tracheostomy placement with ENT.  History of Present Illness:  66 yo M presenting to [facility] ED from outpatient rehab on 11/19/23 for evaluation of altered mental status.   History obtained per chart review and mother's telephone interview, patient unable to participate in interview due to respiratory distress and baseline TBI. 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 He was treated at [facility] for 5 months then discharged to rehab. Per his mother and legal guardian his baseline is: Non verbal but he will yell out sporadic nonsensical words with left sided paralysis. He is able to move his RUE in order to feed himself with finger foods and he has some involuntary movement with that arm, he will swat at you. Similar baseline function with RLE, he can kick and move, but often will lay it bent and to the side. He has enough strength to even try and get out of bed on the right side. She denies any issues with swallowing, but eats mostly soft food. If he is not watched closely with eating he will try to eat all the food at once. The facility staff reported he was at his normal baseline until lunchtime on 11/19/23. It was observed he was more somnolent and not interactive. Staff noted a cough and slightly increased work of breathing. Mom also confirmed noting a congested cough the last time she visited earlier in the week. Staff and mom denied nausea/ vomiting, but mom  reports chronic loose stools.   EMS reported the patient febrile and tachycardic on arrival.   ED course: Upon arrival patient tachycardic and lethargic. Sepsis protocol initiated with antibiotics and IVF resuscitation. Labs significant for mild hypokalemia, otherwise WNL. Initial imaging unremarkable but then patient became hypoxic with SpO2 85% on RA and a CTa was obtained, negative for PE but concerning for aspiration pneumonia. TRH consulted for admission. While patient was pending admission in ED he became increasingly hypoxic in the 60's, placed on a NRB without improvement. PCCM alerted the need to emergently intubate the patient. Medications given: Cefepime, Flagyl, Vancomycin, 2 L IVF bolus, IV contrast Initial Vitals: 97.9, 17, 130, 119/82 & 92 on RA Significant labs: (Labs/ Imaging personally reviewed) EKG : pending Chemistry: Na+: 138, K+: 3.4, BUN/Cr.: 13/ 0.65, Serum CO2/ AG: 26/8 Hematology: WBC: 4.3, Hgb: 14.3,  Lactic/ PCT: 1.1 > 1.4 / pending, COVID-19 & Influenza A/B: negative   ABG: pending post intubation   CXR 11/19/23: no active disease CT head wo contrast 11/19/23: No evidence of acute intracranial abnormality. Stable multifocal encephalomalacia, likely the sequela of prior trauma. Similar age-advanced cerebral atrophy CT angio chest PE 11/19/23:  No pulmonary embolus. Near complete filling of the right mainstem bronchus with filling extending into the bronchus intermedius, right upper, middle and lower lobe bronchi. Layering debris in the trachea. Findings are highly suspicious for aspiration. Minimal ill-defined patchy and nodular airspace disease in the dependent right lower lobe, likely postobstructive pneumonia.   PCCM consulted for assistance in management and  monitoring due to acute hypoxic respiratory failure requiring urgent intubation and mechanical ventilatory support secondary to suspected aspiration and pneumonia.  Please see "Significant Hospital Events"  section below for full detailed hospital course.   Pertinent  Medical History  Seizures TBI (09/2019)  Micro Data:  1/27: SARS-CoV-2/flu/RSV PCR>> negative 1/27: Blood culture x 2>> no growth 1/28: MRSA PCR>>Positive  1/28: Tracheal aspirate>>Pseudomonas aeruginosa and Staphylococcus aureus 1/28: HIV screen>> nonreactive 1/29: BAL>>Pseudomonas aeruginosa & MRSA 2/07: Cdiff>>negative   Antimicrobials:   Anti-infectives (From admission, onward)    Start     Dose/Rate Route Frequency Ordered Stop   12/05/23 1443  ceFAZolin (ANCEF) IVPB 1 g/50 mL premix        over 30 Minutes  Continuous PRN 12/05/23 1443 12/05/23 1443   12/05/23 0000  ceFAZolin (ANCEF) IVPB 2g/100 mL premix        2 g 200 mL/hr over 30 Minutes Intravenous To Radiology 12/04/23 1324 12/05/23 0041   12/01/23 2200  vancomycin (VANCOREADY) IVPB 750 mg/150 mL  Status:  Discontinued        750 mg 150 mL/hr over 60 Minutes Intravenous Every 8 hours 12/01/23 1205 12/01/23 1215   12/01/23 1400  vancomycin (VANCOREADY) IVPB 1250 mg/250 mL  Status:  Discontinued        1,250 mg 166.7 mL/hr over 90 Minutes Intravenous  Once 12/01/23 1205 12/01/23 1215   12/01/23 1315  linezolid (ZYVOX) IVPB 600 mg        600 mg 300 mL/hr over 60 Minutes Intravenous Every 12 hours 12/01/23 1215 12/07/23 2253   12/01/23 0230  piperacillin-tazobactam (ZOSYN) IVPB 3.375 g        3.375 g 12.5 mL/hr over 240 Minutes Intravenous Every 8 hours 12/01/23 0131 12/07/23 1944   11/30/23 1500  levofloxacin (LEVAQUIN) IVPB 750 mg  Status:  Discontinued        750 mg 100 mL/hr over 90 Minutes Intravenous Every 24 hours 11/30/23 0345 12/01/23 0048   11/28/23 0100  vancomycin (VANCOREADY) IVPB 1250 mg/250 mL       Placed in "Followed by" Linked Group   1,250 mg 166.7 mL/hr over 90 Minutes Intravenous Every 12 hours 11/27/23 1153 11/29/23 1331   11/27/23 1300  vancomycin (VANCOREADY) IVPB 1500 mg/300 mL       Placed in "Followed by" Linked Group   1,500  mg 150 mL/hr over 120 Minutes Intravenous  Once 11/27/23 1153 11/27/23 1516   11/23/23 1500  levofloxacin (LEVAQUIN) IVPB 750 mg        750 mg 100 mL/hr over 90 Minutes Intravenous Every 24 hours 11/23/23 1341 11/29/23 1551   11/21/23 2200  doxycycline (VIBRAMYCIN) 100 mg in sodium chloride 0.9 % 250 mL IVPB  Status:  Discontinued        100 mg 125 mL/hr over 120 Minutes Intravenous Every 12 hours 11/21/23 1425 11/27/23 1148   11/21/23 2000  metroNIDAZOLE (FLAGYL) IVPB 500 mg  Status:  Discontinued        500 mg 100 mL/hr over 60 Minutes Intravenous Every 12 hours 11/21/23 1425 11/24/23 1227   11/20/23 1200  Ampicillin-Sulbactam (UNASYN) 3 g in sodium chloride 0.9 % 100 mL IVPB  Status:  Discontinued        3 g 200 mL/hr over 30 Minutes Intravenous Every 6 hours 11/20/23 1131 11/21/23 1423   11/20/23 0800  cefTRIAXone (ROCEPHIN) 2 g in sodium chloride 0.9 % 100 mL IVPB  Status:  Discontinued        2  g 200 mL/hr over 30 Minutes Intravenous Every 24 hours 11/19/23 2322 11/20/23 0229   11/20/23 0100  azithromycin (ZITHROMAX) 500 mg in sodium chloride 0.9 % 250 mL IVPB  Status:  Discontinued        500 mg 250 mL/hr over 60 Minutes Intravenous Every 24 hours 11/19/23 2322 11/21/23 1423   11/19/23 1545  ceFEPIme (MAXIPIME) 2 g in sodium chloride 0.9 % 100 mL IVPB        2 g 200 mL/hr over 30 Minutes Intravenous  Once 11/19/23 1541 11/19/23 1630   11/19/23 1545  metroNIDAZOLE (FLAGYL) IVPB 500 mg        500 mg 100 mL/hr over 60 Minutes Intravenous  Once 11/19/23 1541 11/19/23 1748   11/19/23 1545  vancomycin (VANCOCIN) IVPB 1000 mg/200 mL premix        1,000 mg 200 mL/hr over 60 Minutes Intravenous  Once 11/19/23 1541 11/19/23 1917      Significant Hospital Events: Including procedures, antibiotic start and stop dates in addition to other pertinent events   11/20/23: Admit to ICU due to acute hypoxic respiratory failure requiring urgent intubation and mechanical ventilatory support  secondary to suspected aspiration and pneumonia 11/21/23- patient moving RUE, mother at bedside we reviewed medical plan. He remains on 80mcg/kg/hr levophed, today plan to rescusitate more aggresively and wean from levophed and potentially extubate post SBP.   He is febrile this am.  He is on zithromax, unasyn 11/22/23- s/p bronch yesterday with aspiration of mucus plugging.  Today resp status improved with liberation protocol in proces. SLP post extubation today. 11/23/23- patient is +for pseudomonas resp cultures.  He is also MRSA pcr +, refined therapy during rounds with pharmacist. He remains on MV 11/24/23- patient is still on vasopressor support weaning down on MV.  Secretions are slightly better.  11/25/23- patient weaned off levophed, failed SBT with tachypnea, tachycardia.  Secretions much improved. 11/26/23- on minimal vent support, unable to perform SBT due to copious secretions from ETT.   11/27/23- on minimal vent support, secretions improved.  Change Doxycycline to Vancomycin.  Plan for SBT as tolerated. 11/28/23-Pt successfully extubated 02/4, currently tolerating HHFNC @35L /35%.  Required low dose precedex overnight to allow suctioning due to excessive secretions. Pt with suspected seizure activity developed tremors in the right upper extremity and became minimally responsive.  Received 1g of iv keppra.    11/29/23-Overnight pt developed sinus tachycardia/svt 140 to 160's improved following 2.5 mg iv metoprolol.  Tolerating RA with no signs of respiratory distress.  Transferring to progressive care unit TRH to pick on 02/7 12/01/23: Pt transferred back to ICU with severe acute hypoxic respiratory failure initially placed on HHFNC but due to significant hypoxia O2 sats in the 60's he required reintubation and underwent emergent bronchoscopy which revealed thick secretions in the LUL and LLL resulting in mucous plugging. Therapeutic aspiration performed. CXR showed collapse of the LUL secondary to mucus  plugging  12/03/23: Pt remains mechanically intubated on minimal vent settings.  ENT consulted for tracheostomy placement due to recurrent respiratory failure due to inability to clear secretions.  Requiring levophed gtt to maintain map 65 or higher  12/04/23: No acute events overnight on minimal settings pending tracheostomy placement  12/05/23: No acute events overnight, on minimal vent support. Initially on low dose Levophed, now weaned off.  IR placed PEG tube. Pending Tracheostomy placement on Friday. 12/06/23: No acute events overnight, on minimal vent support. Awaiting Tracheostomy placement tomorrow. 12/07/23: No acute events overnight.  Tracheostomy  placed this morning by ENT, no events with procedure. Will keep sedated today with newly placed Trach with plan for WUA/SBT tomorrow. 12/08/23: All sedation turned off for WUA will perform SBT or TCT as tolerated   Interim History / Subjective:  As outlined above under significant events   Objective   Blood pressure (!) 143/90, pulse (!) 120, temperature 98.6 F (37 C), temperature source Axillary, resp. rate (!) 21, height 5\' 9"  (1.753 m), weight 64.9 kg, SpO2 100%.    Vent Mode: PRVC FiO2 (%):  [28 %] 28 % Set Rate:  [20 bmp] 20 bmp Vt Set:  [450 mL] 450 mL PEEP:  [5 cmH20] 5 cmH20   Intake/Output Summary (Last 24 hours) at 12/08/2023 1248 Last data filed at 12/08/2023 1200 Gross per 24 hour  Intake 705.43 ml  Output 1690 ml  Net -984.57 ml   Filed Weights   12/06/23 0228 12/07/23 0230 12/08/23 0749  Weight: 63.3 kg 63.3 kg 64.9 kg    Examination: General: Acute on chronically-ill appearing frail male, NAD mechanically intubated   HENT: Atraumatic, normocephalic, neck supple, no JVD, Tracheostomy in place clean dry and intact Lungs: Faint rhonchi throughout, even, non labored, synchronous with the vent Cardiovascular: Sinus tachycardia, s1s2, no m/r/g, 2+ radial/2+ distal pulses, trace generalized edema  Abdomen: +BS x4, soft, non  distended  Extremities: Left upper and lower extremities contractured, bilateral foot drop Neuro: Sedated, Left-sided hemiparesis with left arm contractured (baseline), opens eyes to voice and tracks GU: Indwelling foley catheter in place draining dark yellow urine  Resolved Hospital Problem list     Assessment & Plan:   #Sinus tachycardia/SVT  #Hypotension secondary to sedating medication and possible recurrent sepsis  - Continuous telemetry monitoring - Maintain MAP >65 - Prn iv metoprolol for sustained heart rate >120 - IV fluids - Vasopressors as needed to maintain MAP goal - Lactic acid is normalized - HS Troponin negative x2  #Acute hypoxic respiratory failure secondary to aspiration, LUL/LLL mucous plugging, and collapse of the LUL due to mucous plugging in the setting of TBI s/p emergent bronchoscopy 02/8 #Mechanical ventilation  Status post Tracheostomy placement with ENT on 12/06/22 - Full vent support for now: vent settings reviewed and established  - SBT or TCT as tolerated  - Continue lung protective strategies  - Maintain plateau pressures less than 30 cm H2O - Maintain O2 sats 92% or higher  - Prn bronchodilator therapy  - Aggressive pulmonary hygiene - ABX as above  #Pseudomonas & MRSA pneumonia~treated  #Aspiration pneumonia~treated   - Trend WBC and monitor fever curve  - Completed course of abx per ID recommendations   #Chronic Seizure Disorder #Chronic TBI #Sedation needs in setting of mechanical ventilation EEG 02/10: obtained while sedated on propofol and comatose and is abnormal due to severe diffuse slowing indicative of global cerebral dysfunction, medication effect, or both. Epileptiform abnormalities were not seen during this recording.  - Maintain a RASS goal of 0  - Will attempt to wean off dilaudid and propofol gtts as tolerated  - Avoid sedating medications as able - Daily wake up assessment - Continue outpatient vimpat and valproic acid  -  Continue outpatient baclofen  - Continue seroquel  - Seizure precautions   #Dysphagia - TF's via G-tube   Best Practice (right click and "Reselect all SmartList Selections" daily)   Diet/type: TF's  DVT prophylaxis: resume LMWH on 2/16 GI prophylaxis: PPI Lines: right internal jugular central line Foley: yes, and still needed due to  urinary retention  Code Status:  full code Last date of multidisciplinary goals of care discussion [12/08/23]  Labs   CBC: Recent Labs  Lab 12/04/23 0425 12/05/23 0344 12/06/23 0352 12/07/23 0334 12/08/23 0530  WBC 5.6 4.5 4.3 4.7 4.6  NEUTROABS  --   --  2.3  --   --   HGB 9.4* 9.4* 9.2* 10.1* 9.6*  HCT 28.6* 28.8* 28.4* 31.1* 30.4*  MCV 94.7 95.7 96.3 96.6 97.4  PLT 356 347 353 349 367    Basic Metabolic Panel: Recent Labs  Lab 12/03/23 0431 12/04/23 0425 12/05/23 0343 12/06/23 0352 12/07/23 0334 12/08/23 0530  NA 138 137 140 138 141 141  K 4.7 3.5 3.6 3.2* 3.9 3.2*  CL 100 100 101 101 105 108  CO2 26 26 27 26 28 27   GLUCOSE 101* 113* 94 107* 88 136*  BUN 11 10 12 8 17 16   CREATININE 0.59* 0.58* 0.68 0.60* 0.50* 0.52*  CALCIUM 8.4* 7.8* 8.4* 8.5* 8.6* 8.5*  MG 2.6* 2.2 2.2 2.4 2.0 2.0  PHOS 4.7* 4.8* 5.2* 4.4 4.1  --    GFR: Estimated Creatinine Clearance: 126.2 mL/min (A) (by C-G formula based on SCr of 0.52 mg/dL (L)). Recent Labs  Lab 12/01/23 1414 12/01/23 1735 12/02/23 0458 12/05/23 0344 12/06/23 0352 12/07/23 0334 12/08/23 0530  WBC  --   --    < > 4.5 4.3 4.7 4.6  LATICACIDVEN 2.2* 1.9  --   --   --   --   --    < > = values in this interval not displayed.    Liver Function Tests: Recent Labs  Lab 12/02/23 0458 12/03/23 0431 12/07/23 0334  ALBUMIN 2.5* 2.6* 2.2*    No results for input(s): "LIPASE", "AMYLASE" in the last 168 hours. No results for input(s): "AMMONIA" in the last 168 hours.  ABG    Component Value Date/Time   PHART 7.44 12/01/2023 1143   PCO2ART 34 12/01/2023 1143   PO2ART 63 (L)  12/01/2023 1143   HCO3 23.1 12/01/2023 1143   TCO2 28 05/31/2021 0404   ACIDBASEDEF 0.5 12/01/2023 1143   O2SAT 93 12/01/2023 1143     Coagulation Profile: Recent Labs  Lab 12/03/23 1018 12/05/23 0344 12/07/23 0334  INR 1.0 1.0 1.0     Cardiac Enzymes: No results for input(s): "CKTOTAL", "CKMB", "CKMBINDEX", "TROPONINI" in the last 168 hours.  HbA1C: Hgb A1c MFr Bld  Date/Time Value Ref Range Status  04/11/2022 06:10 AM 5.1 4.8 - 5.6 % Final    Comment:    (NOTE) Pre diabetes:          5.7%-6.4%  Diabetes:              >6.4%  Glycemic control for   <7.0% adults with diabetes   05/31/2021 04:18 PM 4.9 4.8 - 5.6 % Final    Comment:    (NOTE) Pre diabetes:          5.7%-6.4%  Diabetes:              >6.4%  Glycemic control for   <7.0% adults with diabetes     CBG: Recent Labs  Lab 12/07/23 1941 12/07/23 2335 12/08/23 0355 12/08/23 0746 12/08/23 1119  GLUCAP 76 106* 138* 108* 105*    Review of Systems:   Unable to assess pt nonverbal/intubated/sedated  Past Medical History:  He,  has a past medical history of 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   Surgical History:   Past Surgical History:  Procedure Laterality Date   GASTROSTOMY TUBE PLACEMENT     IR GASTROSTOMY TUBE MOD SED  12/05/2023     Social History:   reports that he has never smoked. He has never used smokeless tobacco. He reports that he does not currently use alcohol. He reports that he does not use drugs.   Family History:  His family history is not on file.   Allergies No Known Allergies   Home Medications  Prior to Admission medications   Medication Sig Start Date End Date Taking? Authorizing Provider  baclofen (LIORESAL) 10 MG tablet Take 10 mg by mouth 3 (three) times daily.   Yes [provider]  diazePAM, 15 MG Dose, (VALTOCO 15 MG DOSE) 2 x 7.5 MG/0.1ML LQPK Spray  7.5 mg into each nostril in the event of a seizure 04/20/22  Yes Zigmund Daniel., MD  gabapentin (NEURONTIN) 400 MG capsule Take 1 capsule (400 mg total) by mouth 3 (three) times daily. 04/20/22 11/20/23 Yes Zigmund Daniel., MD  lacosamide 100 MG TABS Take 1 tablet (100 mg total) by mouth 2 (two) times daily. 04/20/22 11/20/23 Yes Zigmund Daniel., MD  melatonin 5 MG TABS Take 10 mg by mouth at bedtime.   Yes [provider]  QUEtiapine (SEROQUEL XR) 200 MG 24 hr tablet Take 200 mg by mouth in the morning.   Yes [provider]  QUEtiapine (SEROQUEL) 25 MG tablet Take 25 mg by mouth at bedtime.   Yes [provider]  valproic acid (DEPAKENE) 250 MG capsule Take 4 capsules (1,000 mg total) by mouth 2 (two) times daily. Patient taking differently: Take 500 mg by mouth every morning. 04/20/22 11/20/23 Yes Zigmund Daniel., MD  valproic acid (DEPAKENE) 250 MG capsule Take 750 mg by mouth every evening.   Yes [provider]     Critical care time: 35 minutes     Zada Girt, AGNP  Pulmonary/Critical Care Pager [phone] (please enter 7 digits) PCCM Consult Pager [phone] (please enter 7 digits)

## 2023-12-08 NOTE — Consult Note (Signed)
 Pharmacy Antibiotic Note  Alex [name] is a 28 y.o. male admitted on 11/19/2023 with pneumonia. PMH significant for TBI, left side hemiplegia with contractures of the left wrist and ankles, seizures. Pharmacy has been consulted for vancomycin, Zosyn dosing.  Plan: Day 19 of antibiotics Give vancomycin 1500 mg IV x1 followed by 1250 mg IV Q12H. Goal AUC 400-550. Expected AUC: 490.2 Expected Css min: 11.6 SCr used: 0.80 (actual 0.52)  Weight used: TBW (TBW<IBW), Vd used: 0.72 (BMI 21.1) Start Zosyn 3.375 g IV Q8H Continue to monitor renal function and follow culture results   Height: 5\' 9"  (175.3 cm) Weight: 64.9 kg (143 lb 1.3 oz) IBW/kg (Calculated) : 70.7  Temp (24hrs), Avg:99.2 F (37.3 C), Min:98.4 F (36.9 C), Max:100.9 F (38.3 C)  Recent Labs  Lab 12/04/23 0425 12/05/23 0343 12/05/23 0344 12/06/23 0352 12/07/23 0334 12/08/23 0530  WBC 5.6  --  4.5 4.3 4.7 4.6  CREATININE 0.58* 0.68  --  0.60* 0.50* 0.52*    Estimated Creatinine Clearance: 126.2 mL/min (A) (by C-G formula based on SCr of 0.52 mg/dL (L)).    No Known Allergies  Antimicrobials this admission: 1/27 Vancomycin x1 1/27 Cefepime x1 1/27 Metronidazole>> 2/1 1/28 Azithromycin >> 1/29 1/28 Unasyn >> 1/29 1/29 Doxycycline >> 2/4 1/31 Levofloxacin >> 2/7 2/4 Vancomycin >> 2/6 2/8 Zosyn >> 2/14 2/8 Linezolid >> 2/14 2/15 Vancomycin >> 2/15 Zosyn >>  Microbiology results: 1/27 BCx: NG final 1/28 Sputum: Pseudomonas aeruginosa, MSSA (R- gent, cipro)  1/28 MRSA PCR: positive 1/29 BAL: Pseudomonas aeruginosa, MRSA (R- gent, cipro, TMP/SMX)  2/7 C. diff Quick Screen: negative 2/15 BCx: ordered 2/15 Sputum: ordered  Thank you for allowing pharmacy to be a part of this patient's care.  Celene Squibb, PharmD Clinical Pharmacist 12/08/2023 6:20 PM

## 2023-12-08 NOTE — Plan of Care (Signed)
  Problem: Fluid Volume: Goal: Hemodynamic stability will improve Outcome: Not Progressing   Problem: Clinical Measurements: Goal: Diagnostic test results will improve Outcome: Not Progressing Goal: Signs and symptoms of infection will decrease Outcome: Not Progressing   Problem: Respiratory: Goal: Ability to maintain adequate ventilation will improve Outcome: Not Progressing   Problem: Activity: Goal: Ability to tolerate increased activity will improve Outcome: Not Progressing   Problem: Respiratory: Goal: Ability to maintain a clear airway and adequate ventilation will improve Outcome: Not Progressing   Problem: Role Relationship: Goal: Method of communication will improve Outcome: Not Progressing   Problem: Education: Goal: Knowledge of General Education information will improve Description: Including pain rating scale, medication(s)/side effects and non-pharmacologic comfort measures Outcome: Not Progressing   Problem: Health Behavior/Discharge Planning: Goal: Ability to manage health-related needs will improve Outcome: Not Progressing   Problem: Clinical Measurements: Goal: Ability to maintain clinical measurements within normal limits will improve Outcome: Not Progressing Goal: Will remain free from infection Outcome: Not Progressing Goal: Diagnostic test results will improve Outcome: Not Progressing Goal: Respiratory complications will improve Outcome: Not Progressing Goal: Cardiovascular complication will be avoided Outcome: Not Progressing   Problem: Activity: Goal: Risk for activity intolerance will decrease Outcome: Not Progressing   Problem: Nutrition: Goal: Adequate nutrition will be maintained Outcome: Not Progressing   Problem: Coping: Goal: Level of anxiety will decrease Outcome: Not Progressing   Problem: Elimination: Goal: Will not experience complications related to bowel motility Outcome: Not Progressing Goal: Will not experience  complications related to urinary retention Outcome: Not Progressing   Problem: Pain Managment: Goal: General experience of comfort will improve and/or be controlled Outcome: Not Progressing   Problem: Safety: Goal: Ability to remain free from injury will improve Outcome: Not Progressing   Problem: Skin Integrity: Goal: Risk for impaired skin integrity will decrease Outcome: Not Progressing   Problem: Activity: Goal: Ability to tolerate increased activity will improve Outcome: Not Progressing   Problem: Respiratory: Goal: Ability to maintain a clear airway and adequate ventilation will improve Outcome: Not Progressing   Problem: Role Relationship: Goal: Method of communication will improve Outcome: Not Progressing

## 2023-12-09 DIAGNOSIS — J9601 Acute respiratory failure with hypoxia: Secondary | ICD-10-CM | POA: Diagnosis not present

## 2023-12-09 DIAGNOSIS — J690 Pneumonitis due to inhalation of food and vomit: Secondary | ICD-10-CM | POA: Diagnosis not present

## 2023-12-09 LAB — CBC WITH DIFFERENTIAL/PLATELET
Abs Immature Granulocytes: 0.03 10*3/uL (ref 0.00–0.07)
Basophils Absolute: 0 10*3/uL (ref 0.0–0.1)
Basophils Relative: 0 %
Eosinophils Absolute: 0 10*3/uL (ref 0.0–0.5)
Eosinophils Relative: 0 %
HCT: 30.8 % — ABNORMAL LOW (ref 39.0–52.0)
Hemoglobin: 9.9 g/dL — ABNORMAL LOW (ref 13.0–17.0)
Immature Granulocytes: 0 %
Lymphocytes Relative: 15 %
Lymphs Abs: 1.1 10*3/uL (ref 0.7–4.0)
MCH: 31 pg (ref 26.0–34.0)
MCHC: 32.1 g/dL (ref 30.0–36.0)
MCV: 96.6 fL (ref 80.0–100.0)
Monocytes Absolute: 0.8 10*3/uL (ref 0.1–1.0)
Monocytes Relative: 11 %
Neutro Abs: 5.6 10*3/uL (ref 1.7–7.7)
Neutrophils Relative %: 74 %
Platelets: 367 10*3/uL (ref 150–400)
RBC: 3.19 MIL/uL — ABNORMAL LOW (ref 4.22–5.81)
RDW: 13.8 % (ref 11.5–15.5)
WBC: 7.7 10*3/uL (ref 4.0–10.5)
nRBC: 0 % (ref 0.0–0.2)

## 2023-12-09 LAB — GLUCOSE, CAPILLARY
Glucose-Capillary: 101 mg/dL — ABNORMAL HIGH (ref 70–99)
Glucose-Capillary: 99 mg/dL (ref 70–99)
Glucose-Capillary: 77 mg/dL (ref 70–99)
Glucose-Capillary: 105 mg/dL — ABNORMAL HIGH (ref 70–99)
Glucose-Capillary: 85 mg/dL (ref 70–99)

## 2023-12-09 LAB — RENAL FUNCTION PANEL
Albumin: 2.2 g/dL — ABNORMAL LOW (ref 3.5–5.0)
Anion gap: 9 (ref 5–15)
BUN: 15 mg/dL (ref 6–20)
CO2: 21 mmol/L — ABNORMAL LOW (ref 22–32)
Calcium: 8.5 mg/dL — ABNORMAL LOW (ref 8.9–10.3)
Chloride: 109 mmol/L (ref 98–111)
Creatinine, Ser: 0.47 mg/dL — ABNORMAL LOW (ref 0.61–1.24)
GFR, Estimated: >60 mL/min (ref >60)
Glucose, Bld: 97 mg/dL (ref 70–99)
Phosphorus: 3.1 mg/dL (ref 2.5–4.6)
Potassium: 4.2 mmol/L (ref 3.5–5.1)
Sodium: 139 mmol/L (ref 135–145)

## 2023-12-09 LAB — MRSA NEXT GEN BY PCR, NASAL: MRSA by PCR Next Gen: DETECTED — AB

## 2023-12-09 LAB — MAGNESIUM: Magnesium: 1.7 mg/dL (ref 1.7–2.4)

## 2023-12-09 MED ORDER — LEVALBUTEROL HCL 0.63 MG/3ML IN NEBU
0.6300 mg | INHALATION_SOLUTION | Freq: Four times a day (QID) | RESPIRATORY_TRACT | Status: DC
  Administered 2023-12-09 – 2023-12-10 (×5): 0.63 mg via RESPIRATORY_TRACT
  Filled 2023-12-09 (×5): qty 3

## 2023-12-09 MED ORDER — MAGNESIUM SULFATE 2 GM/50ML IV SOLN
2.0000 g | Freq: Once | INTRAVENOUS | Status: AC
  Administered 2023-12-09: 2 g via INTRAVENOUS
  Filled 2023-12-09: qty 50

## 2023-12-09 MED ORDER — MAGNESIUM SULFATE 2 GM/50ML IV SOLN
2.0000 g | Freq: Once | INTRAVENOUS | Status: DC
  Filled 2023-12-09: qty 50

## 2023-12-09 MED ORDER — ENOXAPARIN SODIUM 40 MG/0.4ML IJ SOSY
40.0000 mg | PREFILLED_SYRINGE | INTRAMUSCULAR | Status: DC
  Administered 2023-12-09 – 2024-05-22 (×166): 40 mg via SUBCUTANEOUS
  Filled 2023-12-09 (×166): qty 0.4

## 2023-12-09 MED ORDER — MUPIROCIN 2 % EX OINT
1.0000 | TOPICAL_OINTMENT | Freq: Two times a day (BID) | CUTANEOUS | Status: AC
  Administered 2023-12-09 – 2023-12-13 (×10): 1 via NASAL
  Filled 2023-12-09: qty 22

## 2023-12-09 NOTE — Progress Notes (Signed)
 Increased WOB, respiration 33, sat 94%, heart rate 125-130. Increased secretions. Respiratory on floor will switch patient back over to ventilator setting

## 2023-12-09 NOTE — Progress Notes (Signed)
 [facility] ADULT ICU REPLACEMENT PROTOCOL   The patient does apply for the [facility] Adult ICU Electrolyte Replacment Protocol based on the criteria listed below:   1.Exclusion criteria: TCTS, ECMO, Dialysis, and Myasthenia Gravis patients 2. Is GFR >/= 30 ml/min? Yes.    Patient's GFR today is >60 3. Is SCr </= 2? Yes.   Patient's SCr is 0.47 mg/dL 4. Did SCr increase >/= 0.5 in 24 hours? No. 5.Pt's weight >40kg  Yes.   6. Abnormal electrolyte(s): Mag  7. Electrolytes replaced per protocol 8.  Call MD STAT for K+ </= 2.5, Phos </= 1, or Mag </= 1 Physician:  Flossie Buffy E Aryelle Figg 12/09/2023 5:53 AM

## 2023-12-09 NOTE — Progress Notes (Signed)
 Patient placed in spontaneous breathing trial. Heart rate currently low 120's, resp 20-25 sat 100%.

## 2023-12-09 NOTE — Plan of Care (Signed)
  Problem: Fluid Volume: Goal: Hemodynamic stability will improve Outcome: Progressing   Problem: Respiratory: Goal: Ability to maintain adequate ventilation will improve Outcome: Progressing   Problem: Clinical Measurements: Goal: Cardiovascular complication will be avoided Outcome: Progressing   Problem: Nutrition: Goal: Adequate nutrition will be maintained Outcome: Progressing   Problem: Elimination: Goal: Will not experience complications related to bowel motility Outcome: Progressing Goal: Will not experience complications related to urinary retention Outcome: Progressing   Problem: Skin Integrity: Goal: Risk for impaired skin integrity will decrease Outcome: Progressing
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
## 2023-12-09 NOTE — Progress Notes (Signed)
 NAME:  AUBERY DOUTHAT, MRN:  [id number], DOB:  1996-09-20, LOS: 20 ADMISSION DATE:  11/19/2023, CONSULTATION DATE:  11/20/23 REFERRING MD:  Dr. Arville Care, CHIEF COMPLAINT:  AMS   Brief Pt Description / Synopsis:  28 y.o. male with PMHx significant for chronic TBI with left sided hemiparesis admitted with Acute Metabolic Encephalopathy and Acute Hypoxic Respiratory Failure in the setting of aspiration and Pseudomonas and MRSA Pneumonia requiring intubation and mechanical ventilation. With recurrent aspiration events, to undergo Tracheostomy placement with ENT.  History of Present Illness:  28 yo M presenting to [facility] ED from outpatient rehab on 11/19/23 for evaluation of altered mental status.   History obtained per chart review and mother's telephone interview, patient unable to participate in interview due to respiratory distress and baseline TBI. 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 He was treated at [facility] for 5 months then discharged to rehab. Per his mother and legal guardian his baseline is: Non verbal but he will yell out sporadic nonsensical words with left sided paralysis. He is able to move his RUE in order to feed himself with finger foods and he has some involuntary movement with that arm, he will swat at you. Similar baseline function with RLE, he can kick and move, but often will lay it bent and to the side. He has enough strength to even try and get out of bed on the right side. She denies any issues with swallowing, but eats mostly soft food. If he is not watched closely with eating he will try to eat all the food at once. The facility staff reported he was at his normal baseline until lunchtime on 11/19/23. It was observed he was more somnolent and not interactive. Staff noted a cough and slightly increased work of breathing. Mom also confirmed noting a congested cough the last time she visited earlier in the week. Staff and mom denied nausea/ vomiting, but mom  reports chronic loose stools.   EMS reported the patient febrile and tachycardic on arrival.   ED course: Upon arrival patient tachycardic and lethargic. Sepsis protocol initiated with antibiotics and IVF resuscitation. Labs significant for mild hypokalemia, otherwise WNL. Initial imaging unremarkable but then patient became hypoxic with SpO2 85% on RA and a CTa was obtained, negative for PE but concerning for aspiration pneumonia. TRH consulted for admission. While patient was pending admission in ED he became increasingly hypoxic in the 60's, placed on a NRB without improvement. PCCM alerted the need to emergently intubate the patient. Medications given: Cefepime, Flagyl, Vancomycin, 2 L IVF bolus, IV contrast Initial Vitals: 97.9, 17, 130, 119/82 & 92 on RA Significant labs: (Labs/ Imaging personally reviewed) EKG : pending Chemistry: Na+: 138, K+: 3.4, BUN/Cr.: 13/ 0.65, Serum CO2/ AG: 26/8 Hematology: WBC: 4.3, Hgb: 14.3,  Lactic/ PCT: 1.1 > 1.4 / pending, COVID-19 & Influenza A/B: negative   ABG: pending post intubation   CXR 11/19/23: no active disease CT head wo contrast 11/19/23: No evidence of acute intracranial abnormality. Stable multifocal encephalomalacia, likely the sequela of prior trauma. Similar age-advanced cerebral atrophy CT angio chest PE 11/19/23:  No pulmonary embolus. Near complete filling of the right mainstem bronchus with filling extending into the bronchus intermedius, right upper, middle and lower lobe bronchi. Layering debris in the trachea. Findings are highly suspicious for aspiration. Minimal ill-defined patchy and nodular airspace disease in the dependent right lower lobe, likely postobstructive pneumonia.   PCCM consulted for assistance in management and  monitoring due to acute hypoxic respiratory failure requiring urgent intubation and mechanical ventilatory support secondary to suspected aspiration and pneumonia.  Please see "Significant Hospital Events"  section below for full detailed hospital course.   Pertinent  Medical History  Seizures TBI (09/2019)  Micro Data:  1/27: SARS-CoV-2/flu/RSV PCR>> negative 1/27: Blood culture x 2>> no growth 1/28: MRSA PCR>>Positive  1/28: Tracheal aspirate>>Pseudomonas aeruginosa and Staphylococcus aureus 1/28: HIV screen>> nonreactive 1/29: BAL>>Pseudomonas aeruginosa & MRSA 2/07: Cdiff>>negative  2/15: Blood x2>>NGTD  2/15: Tracheal aspirate>>moderate gram variable rod   Antimicrobials:   Anti-infectives (From admission, onward)    Start     Dose/Rate Route Frequency Ordered Stop   12/08/23 1915  vancomycin (VANCOREADY) IVPB 1250 mg/250 mL        1,250 mg 166.7 mL/hr over 90 Minutes Intravenous 2 times daily 12/08/23 1821     12/08/23 1915  piperacillin-tazobactam (ZOSYN) IVPB 3.375 g        3.375 g 12.5 mL/hr over 240 Minutes Intravenous Every 8 hours 12/08/23 1821     12/05/23 1443  ceFAZolin (ANCEF) IVPB 1 g/50 mL premix        over 30 Minutes  Continuous PRN 12/05/23 1443 12/05/23 1443   12/05/23 0000  ceFAZolin (ANCEF) IVPB 2g/100 mL premix        2 g 200 mL/hr over 30 Minutes Intravenous To Radiology 12/04/23 1324 12/05/23 0041   12/01/23 2200  vancomycin (VANCOREADY) IVPB 750 mg/150 mL  Status:  Discontinued        750 mg 150 mL/hr over 60 Minutes Intravenous Every 8 hours 12/01/23 1205 12/01/23 1215   12/01/23 1400  vancomycin (VANCOREADY) IVPB 1250 mg/250 mL  Status:  Discontinued        1,250 mg 166.7 mL/hr over 90 Minutes Intravenous  Once 12/01/23 1205 12/01/23 1215   12/01/23 1315  linezolid (ZYVOX) IVPB 600 mg        600 mg 300 mL/hr over 60 Minutes Intravenous Every 12 hours 12/01/23 1215 12/07/23 2253   12/01/23 0230  piperacillin-tazobactam (ZOSYN) IVPB 3.375 g        3.375 g 12.5 mL/hr over 240 Minutes Intravenous Every 8 hours 12/01/23 0131 12/07/23 1944   11/30/23 1500  levofloxacin (LEVAQUIN) IVPB 750 mg  Status:  Discontinued        750 mg 100 mL/hr over 90  Minutes Intravenous Every 24 hours 11/30/23 0345 12/01/23 0048   11/28/23 0100  vancomycin (VANCOREADY) IVPB 1250 mg/250 mL       Placed in "Followed by" Linked Group   1,250 mg 166.7 mL/hr over 90 Minutes Intravenous Every 12 hours 11/27/23 1153 11/29/23 1331   11/27/23 1300  vancomycin (VANCOREADY) IVPB 1500 mg/300 mL       Placed in "Followed by" Linked Group   1,500 mg 150 mL/hr over 120 Minutes Intravenous  Once 11/27/23 1153 11/27/23 1516   11/23/23 1500  levofloxacin (LEVAQUIN) IVPB 750 mg        750 mg 100 mL/hr over 90 Minutes Intravenous Every 24 hours 11/23/23 1341 11/29/23 1551   11/21/23 2200  doxycycline (VIBRAMYCIN) 100 mg in sodium chloride 0.9 % 250 mL IVPB  Status:  Discontinued        100 mg 125 mL/hr over 120 Minutes Intravenous Every 12 hours 11/21/23 1425 11/27/23 1148   11/21/23 2000  metroNIDAZOLE (FLAGYL) IVPB 500 mg  Status:  Discontinued        500 mg 100 mL/hr over 60 Minutes Intravenous Every 12 hours  11/21/23 1425 11/24/23 1227   11/20/23 1200  Ampicillin-Sulbactam (UNASYN) 3 g in sodium chloride 0.9 % 100 mL IVPB  Status:  Discontinued        3 g 200 mL/hr over 30 Minutes Intravenous Every 6 hours 11/20/23 1131 11/21/23 1423   11/20/23 0800  cefTRIAXone (ROCEPHIN) 2 g in sodium chloride 0.9 % 100 mL IVPB  Status:  Discontinued        2 g 200 mL/hr over 30 Minutes Intravenous Every 24 hours 11/19/23 2322 11/20/23 0229   11/20/23 0100  azithromycin (ZITHROMAX) 500 mg in sodium chloride 0.9 % 250 mL IVPB  Status:  Discontinued        500 mg 250 mL/hr over 60 Minutes Intravenous Every 24 hours 11/19/23 2322 11/21/23 1423   11/19/23 1545  ceFEPIme (MAXIPIME) 2 g in sodium chloride 0.9 % 100 mL IVPB        2 g 200 mL/hr over 30 Minutes Intravenous  Once 11/19/23 1541 11/19/23 1630   11/19/23 1545  metroNIDAZOLE (FLAGYL) IVPB 500 mg        500 mg 100 mL/hr over 60 Minutes Intravenous  Once 11/19/23 1541 11/19/23 1748   11/19/23 1545  vancomycin (VANCOCIN) IVPB  1000 mg/200 mL premix        1,000 mg 200 mL/hr over 60 Minutes Intravenous  Once 11/19/23 1541 11/19/23 1917      Significant Hospital Events: Including procedures, antibiotic start and stop dates in addition to other pertinent events   11/20/23: Admit to ICU due to acute hypoxic respiratory failure requiring urgent intubation and mechanical ventilatory support secondary to suspected aspiration and pneumonia 11/21/23- patient moving RUE, mother at bedside we reviewed medical plan. He remains on 2mcg/kg/hr levophed, today plan to rescusitate more aggresively and wean from levophed and potentially extubate post SBP.   He is febrile this am.  He is on zithromax, unasyn 11/22/23- s/p bronch yesterday with aspiration of mucus plugging.  Today resp status improved with liberation protocol in proces. SLP post extubation today. 11/23/23- patient is +for pseudomonas resp cultures.  He is also MRSA pcr +, refined therapy during rounds with pharmacist. He remains on MV 11/24/23- patient is still on vasopressor support weaning down on MV.  Secretions are slightly better.  11/25/23- patient weaned off levophed, failed SBT with tachypnea, tachycardia.  Secretions much improved. 11/26/23- on minimal vent support, unable to perform SBT due to copious secretions from ETT.   11/27/23- on minimal vent support, secretions improved.  Change Doxycycline to Vancomycin.  Plan for SBT as tolerated. 11/28/23-Pt successfully extubated 02/4, currently tolerating HHFNC @35L /35%.  Required low dose precedex overnight to allow suctioning due to excessive secretions. Pt with suspected seizure activity developed tremors in the right upper extremity and became minimally responsive.  Received 1g of iv keppra.    11/29/23-Overnight pt developed sinus tachycardia/svt 140 to 160's improved following 2.5 mg iv metoprolol.  Tolerating RA with no signs of respiratory distress.  Transferring to progressive care unit TRH to pick on 02/7 12/01/23: Pt  transferred back to ICU with severe acute hypoxic respiratory failure initially placed on HHFNC but due to significant hypoxia O2 sats in the 60's he required reintubation and underwent emergent bronchoscopy which revealed thick secretions in the LUL and LLL resulting in mucous plugging. Therapeutic aspiration performed. CXR showed collapse of the LUL secondary to mucus plugging  12/03/23: Pt remains mechanically intubated on minimal vent settings.  ENT consulted for tracheostomy placement due to recurrent respiratory failure  due to inability to clear secretions.  Requiring levophed gtt to maintain map 65 or higher  12/04/23: No acute events overnight on minimal settings pending tracheostomy placement  12/05/23: No acute events overnight, on minimal vent support. Initially on low dose Levophed, now weaned off.  IR placed PEG tube. Pending Tracheostomy placement on Friday. 12/06/23: No acute events overnight, on minimal vent support. Awaiting Tracheostomy placement tomorrow. 12/07/23: No acute events overnight.  Tracheostomy placed this morning by ENT, no events with procedure. Will keep sedated today with newly placed Trach with plan for WUA/SBT tomorrow. 12/08/23: All sedation turned off for WUA will perform SBT or TCT as tolerated.  Pt spike at temp 100.9 F, hypotensive requiring levophed gtt, and tachycardic concerning for possible sepsis.  Blood culture/tracheal aspirate and UA sent.  Restarted broad spectrum abx (vancomycin/zosyn) 12/09/23: Pt no longer requiring levophed gtt.  Tmax 102.4 F  Interim History / Subjective:  As outlined above under significant events   Objective   Blood pressure 103/69, pulse (!) 117, temperature 100 F (37.8 C), temperature source Axillary, resp. rate (!) 25, height 5\' 9"  (1.753 m), weight 62 kg, SpO2 97%.    Vent Mode: PRVC FiO2 (%):  [28 %] 28 % Set Rate:  [20 bmp] 20 bmp Vt Set:  [450 mL] 450 mL PEEP:  [5 cmH20] 5 cmH20   Intake/Output Summary (Last 24 hours) at  12/09/2023 1610 Last data filed at 12/09/2023 0400 Gross per 24 hour  Intake 544.92 ml  Output 1935 ml  Net -1390.08 ml   Filed Weights   12/07/23 0230 12/08/23 0749 12/09/23 0700  Weight: 63.3 kg 64.9 kg 62 kg    Examination: General: Acute on chronically-ill appearing frail male, NAD mechanically intubated   HENT: Atraumatic, normocephalic, neck supple, no JVD, Tracheostomy in place clean dry and intact Lungs: Faint rhonchi throughout, even, non labored, synchronous with the vent Cardiovascular: Sinus tachycardia, s1s2, no m/r/g, 2+ radial/2+ distal pulses, trace generalized edema  Abdomen: +BS x4, soft, non distended  Extremities: Left upper and lower extremities contractured, bilateral foot drop Neuro: Sedated, Left-sided hemiparesis with left arm contractured (baseline), opens eyes to voice and tracks GU: Indwelling foley catheter in place draining dark yellow urine  Resolved Hospital Problem list     Assessment & Plan:   #Sinus tachycardia/SVT  #Hypotension secondary to sedating medication and possible recurrent sepsis  - Continuous telemetry monitoring - Scheduled low dose metoprolol per tube if not hypotensive and prn iv metoprolol for sustained heart rate >120 - IV fluid resuscitation prn and/or vasopressors to maintain map 65 or higher  - Lactic acid is normalized - HS Troponin negative x2  #Acute hypoxic respiratory failure secondary to aspiration, LUL/LLL mucous plugging, and collapse of the LUL due to mucous plugging in the setting of TBI s/p emergent bronchoscopy 02/8 #Mechanical ventilation  Status post Tracheostomy placement with ENT on 12/06/22 - Full vent support for now: vent settings reviewed and established  - SBT or TCT as tolerated  - Continue lung protective strategies  - Maintain plateau pressures less than 30 cm H2O - Maintain O2 sats 92% or higher  - Prn bronchodilator therapy  - Aggressive pulmonary hygiene - ABX as above  #Pseudomonas & MRSA  pneumonia~treated  #Aspiration pneumonia~treated   #Possible recurrent sepsis  - Trend WBC and monitor fever curve  - Trend PCT  - Follow cultures  - Restarted broad spectrum abx (vancomycin and zosyn) pending culture results and sensitivities   #Chronic Seizure Disorder #Chronic  TBI #Sedation needs in setting of mechanical ventilation EEG 02/10: obtained while sedated on propofol and comatose and is abnormal due to severe diffuse slowing indicative of global cerebral dysfunction, medication effect, or both. Epileptiform abnormalities were not seen during this recording.  - Maintain a RASS goal of 0  - Started scheduled oxycodone per tube 02/15  - Avoid sedating medications as able - Daily wake up assessment - Continue outpatient vimpat and valproic acid  - Continue outpatient baclofen  - Continue seroquel  - Seizure precautions   #Dysphagia - TF's via G-tube   Best Practice (right click and "Reselect all SmartList Selections" daily)   Diet/type: TF's  DVT prophylaxis: resume LMWH  GI prophylaxis: PPI Lines: right internal jugular central line if able to get peripheral iv's will remove central line  Foley: yes, and still needed due to urinary retention  Code Status:  full code Last date of multidisciplinary goals of care discussion [12/09/23]  02/16: Updated pts mother Lauretta Chester via telephone regarding pts condition and current plan of care.  All questions were answered  Labs   CBC: Recent Labs  Lab 12/05/23 0344 12/06/23 0352 12/07/23 0334 12/08/23 0530 12/09/23 0410  WBC 4.5 4.3 4.7 4.6 7.7  NEUTROABS  --  2.3  --   --  5.6  HGB 9.4* 9.2* 10.1* 9.6* 9.9*  HCT 28.8* 28.4* 31.1* 30.4* 30.8*  MCV 95.7 96.3 96.6 97.4 96.6  PLT 347 353 349 367 367    Basic Metabolic Panel: Recent Labs  Lab 12/05/23 0343 12/06/23 0352 12/07/23 0334 12/08/23 0530 12/08/23 1757 12/09/23 0410  NA 140 138 141 141  --  139  K 3.6 3.2* 3.9 3.2* 4.2 4.2  CL 101 101 105 108   --  109  CO2 27 26 28 27   --  21*  GLUCOSE 94 107* 88 136*  --  97  BUN 12 8 17 16   --  15  CREATININE 0.68 0.60* 0.50* 0.52*  --  0.47*  CALCIUM 8.4* 8.5* 8.6* 8.5*  --  8.5*  MG 2.2 2.4 2.0 2.0 2.0 1.7  PHOS 5.2* 4.4 4.1  --  3.7 3.1   GFR: Estimated Creatinine Clearance: 120.6 mL/min (A) (by C-G formula based on SCr of 0.47 mg/dL (L)). Recent Labs  Lab 12/06/23 0352 12/07/23 0334 12/08/23 0530 12/08/23 1756 12/08/23 1757 12/09/23 0410  PROCALCITON  --   --   --   --  <0.10  --   WBC 4.3 4.7 4.6  --   --  7.7  LATICACIDVEN  --   --   --  1.4  --   --     Liver Function Tests: Recent Labs  Lab 12/03/23 0431 12/07/23 0334 12/09/23 0410  ALBUMIN 2.6* 2.2* 2.2*    No results for input(s): "LIPASE", "AMYLASE" in the last 168 hours. No results for input(s): "AMMONIA" in the last 168 hours.  ABG    Component Value Date/Time   PHART 7.44 12/01/2023 1143   PCO2ART 34 12/01/2023 1143   PO2ART 63 (L) 12/01/2023 1143   HCO3 23.1 12/01/2023 1143   TCO2 28 05/31/2021 0404   ACIDBASEDEF 0.5 12/01/2023 1143   O2SAT 93 12/01/2023 1143     Coagulation Profile: Recent Labs  Lab 12/03/23 1018 12/05/23 0344 12/07/23 0334  INR 1.0 1.0 1.0     Cardiac Enzymes: No results for input(s): "CKTOTAL", "CKMB", "CKMBINDEX", "TROPONINI" in the last 168 hours.  HbA1C: Hgb A1c MFr Bld  Date/Time Value Ref  Range Status  04/11/2022 06:10 AM 5.1 4.8 - 5.6 % Final    Comment:    (NOTE) Pre diabetes:          5.7%-6.4%  Diabetes:              >6.4%  Glycemic control for   <7.0% adults with diabetes   05/31/2021 04:18 PM 4.9 4.8 - 5.6 % Final    Comment:    (NOTE) Pre diabetes:          5.7%-6.4%  Diabetes:              >6.4%  Glycemic control for   <7.0% adults with diabetes     CBG: Recent Labs  Lab 12/08/23 1119 12/08/23 1555 12/08/23 1930 12/09/23 0352 12/09/23 0815  GLUCAP 105* 89 103* 101* 99    Review of Systems:   Unable to assess pt  nonverbal/intubated/sedated  Past Medical History:  He,  has a past medical history of 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   Surgical History:   Past Surgical History:  Procedure Laterality Date   GASTROSTOMY TUBE PLACEMENT     IR GASTROSTOMY TUBE MOD SED  12/05/2023     Social History:   reports that he has never smoked. He has never used smokeless tobacco. He reports that he does not currently use alcohol. He reports that he does not use drugs.   Family History:  His family history is not on file.   Allergies No Known Allergies   Home Medications  Prior to Admission medications   Medication Sig Start Date End Date Taking? Authorizing Provider  baclofen (LIORESAL) 10 MG tablet Take 10 mg by mouth 3 (three) times daily.   Yes [provider]  diazePAM, 15 MG Dose, (VALTOCO 15 MG DOSE) 2 x 7.5 MG/0.1ML LQPK Spray 7.5 mg into each nostril in the event of a seizure 04/20/22  Yes Zigmund Daniel., MD  gabapentin (NEURONTIN) 400 MG capsule Take 1 capsule (400 mg total) by mouth 3 (three) times daily. 04/20/22 11/20/23 Yes Zigmund Daniel., MD  lacosamide 100 MG TABS Take 1 tablet (100 mg total) by mouth 2 (two) times daily. 04/20/22 11/20/23 Yes Zigmund Daniel., MD  melatonin 5 MG TABS Take 10 mg by mouth at bedtime.   Yes [provider]  QUEtiapine (SEROQUEL XR) 200 MG 24 hr tablet Take 200 mg by mouth in the morning.   Yes [provider]  QUEtiapine (SEROQUEL) 25 MG tablet Take 25 mg by mouth at bedtime.   Yes [provider]  valproic acid (DEPAKENE) 250 MG capsule Take 4 capsules (1,000 mg total) by mouth 2 (two) times daily. Patient taking differently: Take 500 mg by mouth every morning. 04/20/22 11/20/23 Yes Zigmund Daniel., MD  valproic acid (DEPAKENE) 250 MG capsule Take 750 mg by mouth every evening.   Yes [provider]     Critical care time: 40 minutes     Zada Girt, AGNP  Pulmonary/Critical Care Pager [phone] (please enter 7 digits) PCCM Consult Pager [phone] (please enter 7 digits)

## 2023-12-09 NOTE — Progress Notes (Signed)
 Right internal jugular central line discontinued per order. Sterile technique used, pressure held for 4 minutes, no bleeding noted, site covered with Vaseline gauze, gauze and Tegaderm. Patient tolerated well.

## 2023-12-09 NOTE — Op Note (Signed)
 OPERATIVE REPORT  Attending Physician: Magnus Ivan. Okey Dupre, MD, MBA, FARS    Otolaryngology-Head & Neck Surgery      Preoperative Diagnosis: Ventilatory dependence; failure to wean / extubate. Postoperative Diagnosis: Same.   Procedure(s) Performed:   Tracheotomy, CPT 31600   Teaching Surgeon:  Magnus Ivan. Okey Dupre, MD, MBA, FARS Assistants:  None   Anesthesia:  General Specimens:  None Drains:  6 cuffed tracheostomy tube Estimated Blood Loss:  10 mL  Operative Findings: Some scarring from prior tracheostomy, performed prior to decannulation.  Procedure: After informed consent was obtained from the patient's Mother.  The patient was brought from the ICU to the operating room and placed supine on the operating room table. After smooth induction of general anesthesia, a timeout was called and all parties were in agreement.   A shoulder roll was placed and landmarks marked.  The neck was injected with approximately 6 mL of 1% lidocaine with 1:100,000 of epinephrine.  The neck was then prepped and draped in the usual sterile manner.    A horizontal 4 cm incision was then made using a 15 blade through the skin just below the level of the cricoid cartilage and just beneath the scar site from prior tracheostomy tube placement and subsequent decannulation.  The dissection was carried down through the strap muscles using a Jake's hemostat and bipolar electrocautery.  Next, dissection continued to the level of the thyroid gland which was divided using monopolar cautery without difficulty.  The anterior tracheal wall was then identified and cleared of soft tissues using a Kitner sponge.  A Bjork-flap incision was then made through the anterior tracheal wall using a 15 blade and extended using Metzenbaum scissors.  The endotracheal tube was in view at this point and the patient ventilating well.  The Bjork-flap was then secured to the inferior skin margin using a single 4-0 Vicryl suture without difficulty.   The endotracheal tube was then pulled back by Anesthesia and, once above the tracheotomy site, a #6 cuffed tracheostomy tube placed without difficulty.  The obturator was then removed and the inner cannula placed.  The tracheostomy tube was then connected to the ventilatory circuit and secured with 4 interrupted 2-0 silk sutures to the skin at each corner.  Some surgicel was placed adjacent to the tracheal wall margin and, finally, a gauze dressing soaked in betadyne placed under and around the tracheostomy tube flange.  Adequate hemostasis was assured.   The patient's care was turned over to the Anesthesia team who successfully transported the patient back to the ICU in stable condition.  All instrument, sharp and lap counts were correct at the end of the case.   Teaching Surgeon Attestation:  I was present and performed the entire procedure.  Magnus Ivan. Okey Dupre, MD, MBA, [facility] Otolaryngology-Head & Neck Surgery [facility] [phone]

## 2023-12-10 ENCOUNTER — Encounter: Payer: Self-pay | Admitting: Otolaryngology

## 2023-12-10 DIAGNOSIS — J9601 Acute respiratory failure with hypoxia: Secondary | ICD-10-CM | POA: Diagnosis not present

## 2023-12-10 LAB — GLUCOSE, CAPILLARY
Glucose-Capillary: 106 mg/dL — ABNORMAL HIGH (ref 70–99)
Glucose-Capillary: 125 mg/dL — ABNORMAL HIGH (ref 70–99)
Glucose-Capillary: 136 mg/dL — ABNORMAL HIGH (ref 70–99)
Glucose-Capillary: 160 mg/dL — ABNORMAL HIGH (ref 70–99)
Glucose-Capillary: 75 mg/dL (ref 70–99)
Glucose-Capillary: 89 mg/dL (ref 70–99)

## 2023-12-10 LAB — CBC WITH DIFFERENTIAL/PLATELET
Abs Immature Granulocytes: 0.03 10*3/uL (ref 0.00–0.07)
Basophils Absolute: 0 10*3/uL (ref 0.0–0.1)
Basophils Relative: 0 %
Eosinophils Absolute: 0.2 10*3/uL (ref 0.0–0.5)
Eosinophils Relative: 2 %
HCT: 31.1 % — ABNORMAL LOW (ref 39.0–52.0)
Hemoglobin: 10 g/dL — ABNORMAL LOW (ref 13.0–17.0)
Immature Granulocytes: 0 %
Lymphocytes Relative: 22 %
Lymphs Abs: 1.6 10*3/uL (ref 0.7–4.0)
MCH: 31.2 pg (ref 26.0–34.0)
MCHC: 32.2 g/dL (ref 30.0–36.0)
MCV: 96.9 fL (ref 80.0–100.0)
Monocytes Absolute: 0.7 10*3/uL (ref 0.1–1.0)
Monocytes Relative: 9 %
Neutro Abs: 4.7 10*3/uL (ref 1.7–7.7)
Neutrophils Relative %: 67 %
Platelets: 272 10*3/uL (ref 150–400)
RBC: 3.21 MIL/uL — ABNORMAL LOW (ref 4.22–5.81)
RDW: 13.4 % (ref 11.5–15.5)
WBC: 7.2 10*3/uL (ref 4.0–10.5)
nRBC: 0 % (ref 0.0–0.2)

## 2023-12-10 LAB — MAGNESIUM: Magnesium: 2.1 mg/dL (ref 1.7–2.4)

## 2023-12-10 LAB — PHOSPHORUS: Phosphorus: 3.9 mg/dL (ref 2.5–4.6)

## 2023-12-10 LAB — BASIC METABOLIC PANEL
Anion gap: 7 (ref 5–15)
BUN: 13 mg/dL (ref 6–20)
CO2: 25 mmol/L (ref 22–32)
Calcium: 8.2 mg/dL — ABNORMAL LOW (ref 8.9–10.3)
Chloride: 107 mmol/L (ref 98–111)
Creatinine, Ser: 0.53 mg/dL — ABNORMAL LOW (ref 0.61–1.24)
GFR, Estimated: >60 mL/min (ref >60)
Glucose, Bld: 102 mg/dL — ABNORMAL HIGH (ref 70–99)
Potassium: 3.4 mmol/L — ABNORMAL LOW (ref 3.5–5.1)
Sodium: 139 mmol/L (ref 135–145)

## 2023-12-10 MED ORDER — POLYETHYLENE GLYCOL 3350 17 G PO PACK: 17.0000 g | PACK | Freq: Every day | ORAL | Status: DC | PRN

## 2023-12-10 MED ORDER — QUETIAPINE FUMARATE 25 MG PO TABS
50.0000 mg | ORAL_TABLET | Freq: Three times a day (TID) | ORAL | Status: DC
  Administered 2023-12-10 – 2024-05-23 (×494): 50 mg
  Filled 2023-12-10 (×497): qty 2

## 2023-12-10 MED ORDER — PROPRANOLOL HCL 1 MG/ML IV SOLN
1.0000 mg | Freq: Three times a day (TID) | INTRAVENOUS | Status: DC
  Filled 2023-12-10: qty 1

## 2023-12-10 MED ORDER — PROPRANOLOL HCL 20 MG PO TABS
20.0000 mg | ORAL_TABLET | Freq: Three times a day (TID) | ORAL | Status: DC
  Administered 2023-12-11 – 2023-12-12 (×7): 20 mg
  Filled 2023-12-10 (×7): qty 1

## 2023-12-10 MED ORDER — PROPRANOLOL HCL 1 MG/ML IV SOLN: 1.0000 mg | Freq: Three times a day (TID) | INTRAVENOUS | Status: DC

## 2023-12-10 MED ORDER — FENTANYL CITRATE PF 50 MCG/ML IJ SOSY
50.0000 ug | PREFILLED_SYRINGE | Freq: Once | INTRAMUSCULAR | Status: AC
  Administered 2023-12-10: 50 ug via INTRAVENOUS
  Filled 2023-12-10: qty 1

## 2023-12-10 MED ORDER — POTASSIUM CHLORIDE 20 MEQ PO PACK
40.0000 meq | PACK | Freq: Once | ORAL | Status: AC
  Administered 2023-12-10: 40 meq
  Filled 2023-12-10: qty 2

## 2023-12-10 MED ORDER — PROPRANOLOL HCL 1 MG/ML IV SOLN
1.0000 mg | Freq: Once | INTRAVENOUS | Status: AC
Start: 2023-12-10 — End: 2023-12-10
  Administered 2023-12-10: 1 mg via INTRAVENOUS
  Filled 2023-12-10: qty 1

## 2023-12-10 MED ORDER — LEVALBUTEROL HCL 0.63 MG/3ML IN NEBU
0.6300 mg | INHALATION_SOLUTION | Freq: Four times a day (QID) | RESPIRATORY_TRACT | Status: DC | PRN
  Administered 2024-03-13: 0.63 mg via RESPIRATORY_TRACT
  Filled 2023-12-10 (×2): qty 3

## 2023-12-10 MED ORDER — GABAPENTIN 250 MG/5ML PO SOLN
300.0000 mg | Freq: Three times a day (TID) | ORAL | Status: DC
  Administered 2023-12-10 – 2024-05-23 (×494): 300 mg
  Filled 2023-12-10 (×515): qty 6

## 2023-12-10 MED ORDER — GABAPENTIN 300 MG PO CAPS: 300.0000 mg | ORAL_CAPSULE | Freq: Three times a day (TID) | ORAL | Status: DC

## 2023-12-10 MED ORDER — PROPRANOLOL HCL 20 MG PO TABS: 20.0000 mg | ORAL_TABLET | Freq: Three times a day (TID) | ORAL | Status: DC

## 2023-12-10 MED ORDER — LINEZOLID 600 MG PO TABS
600.0000 mg | ORAL_TABLET | Freq: Two times a day (BID) | ORAL | Status: DC
Start: 2023-12-10 — End: 2023-12-12
  Administered 2023-12-10 – 2023-12-12 (×5): 600 mg
  Filled 2023-12-10 (×5): qty 1

## 2023-12-10 MED ORDER — MIDAZOLAM HCL 2 MG/2ML IJ SOLN
2.0000 mg | Freq: Once | INTRAMUSCULAR | Status: AC
  Administered 2023-12-10: 2 mg via INTRAVENOUS
  Filled 2023-12-10: qty 2

## 2023-12-10 MED ORDER — GLYCOPYRROLATE 0.2 MG/ML IJ SOLN
0.2000 mg | Freq: Four times a day (QID) | INTRAMUSCULAR | Status: DC | PRN
  Administered 2023-12-10 – 2023-12-19 (×4): 0.2 mg via INTRAVENOUS
  Filled 2023-12-10 (×5): qty 1
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
## 2023-12-10 NOTE — Plan of Care (Signed)
  Problem: Fluid Volume: Goal: Hemodynamic stability will improve Outcome: Progressing   Problem: Clinical Measurements: Goal: Signs and symptoms of infection will decrease Outcome: Progressing   Problem: Activity: Goal: Risk for activity intolerance will decrease Outcome: Progressing   Problem: Nutrition: Goal: Adequate nutrition will be maintained Outcome: Progressing   Problem: Elimination: Goal: Will not experience complications related to urinary retention Outcome: Progressing   Problem: Pain Managment: Goal: General experience of comfort will improve and/or be controlled Outcome: Progressing

## 2023-12-10 NOTE — Plan of Care (Signed)
  Problem: Clinical Measurements: Goal: Signs and symptoms of infection will decrease Outcome: Progressing   
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
## 2023-12-10 NOTE — Progress Notes (Signed)
 NAME:  Alex [name], MRN:  [id number], DOB:  December 12, 1995, LOS: 21 ADMISSION DATE:  11/19/2023, CONSULTATION DATE:  11/20/23 REFERRING MD:  Dr. Arville Care, CHIEF COMPLAINT:  AMS   Brief Pt Description / Synopsis:  28 y.o. male with PMHx significant for chronic TBI with left sided hemiparesis admitted with Acute Metabolic Encephalopathy and Acute Hypoxic Respiratory Failure in the setting of aspiration and Pseudomonas and MRSA Pneumonia requiring intubation and mechanical ventilation. With recurrent aspiration events, to undergo Tracheostomy placement with ENT.  History of Present Illness:  28 yo M presenting to [facility] ED from outpatient rehab on 11/19/23 for evaluation of altered mental status.   History obtained per chart review and mother's telephone interview, patient unable to participate in interview due to respiratory distress and baseline TBI. 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 He was treated at [facility] for 5 months then discharged to rehab. Per his mother and legal guardian his baseline is: Non verbal but he will yell out sporadic nonsensical words with left sided paralysis. He is able to move his RUE in order to feed himself with finger foods and he has some involuntary movement with that arm, he will swat at you. Similar baseline function with RLE, he can kick and move, but often will lay it bent and to the side. He has enough strength to even try and get out of bed on the right side. She denies any issues with swallowing, but eats mostly soft food. If he is not watched closely with eating he will try to eat all the food at once. The facility staff reported he was at his normal baseline until lunchtime on 11/19/23. It was observed he was more somnolent and not interactive. Staff noted a cough and slightly increased work of breathing. Mom also confirmed noting a congested cough the last time she visited earlier in the week. Staff and mom denied nausea/ vomiting, but mom  reports chronic loose stools.   EMS reported the patient febrile and tachycardic on arrival.   ED course: Upon arrival patient tachycardic and lethargic. Sepsis protocol initiated with antibiotics and IVF resuscitation. Labs significant for mild hypokalemia, otherwise WNL. Initial imaging unremarkable but then patient became hypoxic with SpO2 85% on RA and a CTa was obtained, negative for PE but concerning for aspiration pneumonia. TRH consulted for admission. While patient was pending admission in ED he became increasingly hypoxic in the 60's, placed on a NRB without improvement. PCCM alerted the need to emergently intubate the patient. Medications given: Cefepime, Flagyl, Vancomycin, 2 L IVF bolus, IV contrast Initial Vitals: 97.9, 17, 130, 119/82 & 92 on RA Significant labs: (Labs/ Imaging personally reviewed) EKG : pending Chemistry: Na+: 138, K+: 3.4, BUN/Cr.: 13/ 0.65, Serum CO2/ AG: 26/8 Hematology: WBC: 4.3, Hgb: 14.3,  Lactic/ PCT: 1.1 > 1.4 / pending, COVID-19 & Influenza A/B: negative   ABG: pending post intubation   CXR 11/19/23: no active disease CT head wo contrast 11/19/23: No evidence of acute intracranial abnormality. Stable multifocal encephalomalacia, likely the sequela of prior trauma. Similar age-advanced cerebral atrophy CT angio chest PE 11/19/23:  No pulmonary embolus. Near complete filling of the right mainstem bronchus with filling extending into the bronchus intermedius, right upper, middle and lower lobe bronchi. Layering debris in the trachea. Findings are highly suspicious for aspiration. Minimal ill-defined patchy and nodular airspace disease in the dependent right lower lobe, likely postobstructive pneumonia.   PCCM consulted for assistance in management and  monitoring due to acute hypoxic respiratory failure requiring urgent intubation and mechanical ventilatory support secondary to suspected aspiration and pneumonia.  Please see "Significant Hospital Events"  section below for full detailed hospital course.   Pertinent  Medical History  Seizures TBI (09/2019)  Micro Data:  1/27: SARS-CoV-2/flu/RSV PCR>> negative 1/27: Blood culture x 2>> no growth 1/28: MRSA PCR>>Positive  1/28: Tracheal aspirate>>Pseudomonas aeruginosa and Staphylococcus aureus 1/28: HIV screen>> nonreactive 1/29: BAL>>Pseudomonas aeruginosa & MRSA 2/07: Cdiff>>negative  2/15: Blood x2>>NGTD  2/15: Tracheal aspirate>>moderate gram variable rod   Antimicrobials:   Anti-infectives (From admission, onward)    Start     Dose/Rate Route Frequency Ordered Stop   12/08/23 1915  vancomycin (VANCOREADY) IVPB 1250 mg/250 mL        1,250 mg 166.7 mL/hr over 90 Minutes Intravenous 2 times daily 12/08/23 1821     12/08/23 1915  piperacillin-tazobactam (ZOSYN) IVPB 3.375 g        3.375 g 12.5 mL/hr over 240 Minutes Intravenous Every 8 hours 12/08/23 1821     12/05/23 1443  ceFAZolin (ANCEF) IVPB 1 g/50 mL premix        over 30 Minutes  Continuous PRN 12/05/23 1443 12/05/23 1443   12/05/23 0000  ceFAZolin (ANCEF) IVPB 2g/100 mL premix        2 g 200 mL/hr over 30 Minutes Intravenous To Radiology 12/04/23 1324 12/05/23 0041   12/01/23 2200  vancomycin (VANCOREADY) IVPB 750 mg/150 mL  Status:  Discontinued        750 mg 150 mL/hr over 60 Minutes Intravenous Every 8 hours 12/01/23 1205 12/01/23 1215   12/01/23 1400  vancomycin (VANCOREADY) IVPB 1250 mg/250 mL  Status:  Discontinued        1,250 mg 166.7 mL/hr over 90 Minutes Intravenous  Once 12/01/23 1205 12/01/23 1215   12/01/23 1315  linezolid (ZYVOX) IVPB 600 mg        600 mg 300 mL/hr over 60 Minutes Intravenous Every 12 hours 12/01/23 1215 12/07/23 2253   12/01/23 0230  piperacillin-tazobactam (ZOSYN) IVPB 3.375 g        3.375 g 12.5 mL/hr over 240 Minutes Intravenous Every 8 hours 12/01/23 0131 12/07/23 1944   11/30/23 1500  levofloxacin (LEVAQUIN) IVPB 750 mg  Status:  Discontinued        750 mg 100 mL/hr over 90  Minutes Intravenous Every 24 hours 11/30/23 0345 12/01/23 0048   11/28/23 0100  vancomycin (VANCOREADY) IVPB 1250 mg/250 mL       Placed in "Followed by" Linked Group   1,250 mg 166.7 mL/hr over 90 Minutes Intravenous Every 12 hours 11/27/23 1153 11/29/23 1331   11/27/23 1300  vancomycin (VANCOREADY) IVPB 1500 mg/300 mL       Placed in "Followed by" Linked Group   1,500 mg 150 mL/hr over 120 Minutes Intravenous  Once 11/27/23 1153 11/27/23 1516   11/23/23 1500  levofloxacin (LEVAQUIN) IVPB 750 mg        750 mg 100 mL/hr over 90 Minutes Intravenous Every 24 hours 11/23/23 1341 11/29/23 1551   11/21/23 2200  doxycycline (VIBRAMYCIN) 100 mg in sodium chloride 0.9 % 250 mL IVPB  Status:  Discontinued        100 mg 125 mL/hr over 120 Minutes Intravenous Every 12 hours 11/21/23 1425 11/27/23 1148   11/21/23 2000  metroNIDAZOLE (FLAGYL) IVPB 500 mg  Status:  Discontinued        500 mg 100 mL/hr over 60 Minutes Intravenous Every 12 hours  11/21/23 1425 11/24/23 1227   11/20/23 1200  Ampicillin-Sulbactam (UNASYN) 3 g in sodium chloride 0.9 % 100 mL IVPB  Status:  Discontinued        3 g 200 mL/hr over 30 Minutes Intravenous Every 6 hours 11/20/23 1131 11/21/23 1423   11/20/23 0800  cefTRIAXone (ROCEPHIN) 2 g in sodium chloride 0.9 % 100 mL IVPB  Status:  Discontinued        2 g 200 mL/hr over 30 Minutes Intravenous Every 24 hours 11/19/23 2322 11/20/23 0229   11/20/23 0100  azithromycin (ZITHROMAX) 500 mg in sodium chloride 0.9 % 250 mL IVPB  Status:  Discontinued        500 mg 250 mL/hr over 60 Minutes Intravenous Every 24 hours 11/19/23 2322 11/21/23 1423   11/19/23 1545  ceFEPIme (MAXIPIME) 2 g in sodium chloride 0.9 % 100 mL IVPB        2 g 200 mL/hr over 30 Minutes Intravenous  Once 11/19/23 1541 11/19/23 1630   11/19/23 1545  metroNIDAZOLE (FLAGYL) IVPB 500 mg        500 mg 100 mL/hr over 60 Minutes Intravenous  Once 11/19/23 1541 11/19/23 1748   11/19/23 1545  vancomycin (VANCOCIN) IVPB  1000 mg/200 mL premix        1,000 mg 200 mL/hr over 60 Minutes Intravenous  Once 11/19/23 1541 11/19/23 1917      Significant Hospital Events: Including procedures, antibiotic start and stop dates in addition to other pertinent events   11/20/23: Admit to ICU due to acute hypoxic respiratory failure requiring urgent intubation and mechanical ventilatory support secondary to suspected aspiration and pneumonia 11/21/23- patient moving RUE, mother at bedside we reviewed medical plan. He remains on 43mcg/kg/hr levophed, today plan to rescusitate more aggresively and wean from levophed and potentially extubate post SBP.   He is febrile this am.  He is on zithromax, unasyn 11/22/23- s/p bronch yesterday with aspiration of mucus plugging.  Today resp status improved with liberation protocol in proces. SLP post extubation today. 11/23/23- patient is +for pseudomonas resp cultures.  He is also MRSA pcr +, refined therapy during rounds with pharmacist. He remains on MV 11/24/23- patient is still on vasopressor support weaning down on MV.  Secretions are slightly better.  11/25/23- patient weaned off levophed, failed SBT with tachypnea, tachycardia.  Secretions much improved. 11/26/23- on minimal vent support, unable to perform SBT due to copious secretions from ETT.   11/27/23- on minimal vent support, secretions improved.  Change Doxycycline to Vancomycin.  Plan for SBT as tolerated. 11/28/23-Pt successfully extubated 02/4, currently tolerating HHFNC @35L /35%.  Required low dose precedex overnight to allow suctioning due to excessive secretions. Pt with suspected seizure activity developed tremors in the right upper extremity and became minimally responsive.  Received 1g of iv keppra.    11/29/23-Overnight pt developed sinus tachycardia/svt 140 to 160's improved following 2.5 mg iv metoprolol.  Tolerating RA with no signs of respiratory distress.  Transferring to progressive care unit TRH to pick on 02/7 12/01/23: Pt  transferred back to ICU with severe acute hypoxic respiratory failure initially placed on HHFNC but due to significant hypoxia O2 sats in the 60's he required reintubation and underwent emergent bronchoscopy which revealed thick secretions in the LUL and LLL resulting in mucous plugging. Therapeutic aspiration performed. CXR showed collapse of the LUL secondary to mucus plugging  12/03/23: Pt remains mechanically intubated on minimal vent settings.  ENT consulted for tracheostomy placement due to recurrent respiratory failure  due to inability to clear secretions.  Requiring levophed gtt to maintain map 65 or higher  12/04/23: No acute events overnight on minimal settings pending tracheostomy placement  12/05/23: No acute events overnight, on minimal vent support. Initially on low dose Levophed, now weaned off.  IR placed PEG tube. Pending Tracheostomy placement on Friday. 12/06/23: No acute events overnight, on minimal vent support. Awaiting Tracheostomy placement tomorrow. 12/07/23: No acute events overnight.  Tracheostomy placed this morning by ENT, no events with procedure. Will keep sedated today with newly placed Trach with plan for WUA/SBT tomorrow. 12/08/23: All sedation turned off for WUA will perform SBT or TCT as tolerated.  Pt spike at temp 100.9 F, hypotensive requiring levophed gtt, and tachycardic concerning for possible sepsis.  Blood culture/tracheal aspirate and UA sent.  Restarted broad spectrum abx (vancomycin/zosyn) 12/09/23: Pt no longer requiring levophed gtt.  Tmax 102.4 F 12/10/23: Central Line Removed, not requiring pressors or sedation. Tolearting TC.  Interim History / Subjective:  As outlined above under significant events   Objective   Blood pressure 123/80, pulse (!) 114, temperature 98.9 F (37.2 C), temperature source Axillary, resp. rate 20, height 5\' 9"  (1.753 m), weight 62 kg, SpO2 100%.    Vent Mode: PRVC FiO2 (%):  [28 %] 28 % Set Rate:  [20 bmp] 20 bmp Vt Set:  [450  mL] 450 mL PEEP:  [5 cmH20] 5 cmH20 Pressure Support:  [5 cmH20] 5 cmH20   Intake/Output Summary (Last 24 hours) at 12/10/2023 0456 Last data filed at 12/10/2023 0400 Gross per 24 hour  Intake 1036.78 ml  Output 2495 ml  Net -1458.22 ml   Filed Weights   12/07/23 0230 12/08/23 0749 12/09/23 0700  Weight: 63.3 kg 64.9 kg 62 kg    Examination: General: Acute on chronically-ill appearing frail male, NAD mechanically intubated   HENT: Atraumatic, normocephalic, neck supple, no JVD, Tracheostomy in place clean dry and intact Lungs: Faint rhonchi throughout, even, non labored, synchronous with the vent Cardiovascular: Sinus tachycardia, s1s2, no m/r/g, 2+ radial/2+ distal pulses, trace generalized edema  Abdomen: +BS x4, soft, non distended  Extremities: Left upper and lower extremities contractured, bilateral foot drop Neuro: Sedated, Left-sided hemiparesis with left arm contractured (baseline), opens eyes to voice and tracks GU: Indwelling foley catheter in place draining dark yellow urine  Resolved Hospital Problem list     Assessment & Plan:   #Sinus tachycardia/SVT  #Hypotension secondary to sedating medication and possible recurrent sepsis  - Continuous telemetry monitoring - Scheduled low dose metoprolol per tube if not hypotensive and prn iv metoprolol for sustained heart rate >120 - IV fluid resuscitation prn and/or vasopressors to maintain map 65 or higher  - Lactic acid is normalized - HS Troponin negative x2  #Acute hypoxic respiratory failure secondary to aspiration, LUL/LLL mucous plugging, and collapse of the LUL due to mucous plugging in the setting of TBI s/p emergent bronchoscopy 02/8 #Mechanical ventilation  Status post Tracheostomy placement with ENT on 12/06/22 - Full vent support for now: vent settings reviewed and established  - SBT or TCT as tolerated  - Continue lung protective strategies  - Maintain plateau pressures less than 30 cm H2O - Maintain O2  sats 92% or higher  - Prn bronchodilator therapy  - Aggressive pulmonary hygiene - ABX as above  #Pseudomonas & MRSA pneumonia~treated  #Aspiration pneumonia~treated   #Possible recurrent sepsis  - Trend WBC and monitor fever curve  - Trend PCT  - Follow cultures  - Restarted  broad spectrum abx (vancomycin and zosyn) pending culture results and sensitivities   #Chronic Seizure Disorder #Chronic TBI #Sedation needs in setting of mechanical ventilation EEG 02/10: obtained while sedated on propofol and comatose and is abnormal due to severe diffuse slowing indicative of global cerebral dysfunction, medication effect, or both. Epileptiform abnormalities were not seen during this recording.  - Maintain a RASS goal of 0  - Started scheduled oxycodone per tube 02/15  - Avoid sedating medications as able - Daily wake up assessment - Continue outpatient vimpat and valproic acid  - Continue outpatient baclofen  - Continue seroquel  - Seizure precautions   #Dysphagia - TF's via G-tube   Best Practice (right click and "Reselect all SmartList Selections" daily)   Diet/type: TF's  DVT prophylaxis: resume LMWH  GI prophylaxis: PPI Lines: right internal jugular central line if able to get peripheral iv's will remove central line  Foley: yes, and still needed due to urinary retention  Code Status:  full code Last date of multidisciplinary goals of care discussion [12/09/23]  02/16: Updated pts mother Lauretta Chester via telephone regarding pts condition and current plan of care.  All questions were answered  Labs   CBC: Recent Labs  Lab 12/05/23 0344 12/06/23 0352 12/07/23 0334 12/08/23 0530 12/09/23 0410  WBC 4.5 4.3 4.7 4.6 7.7  NEUTROABS  --  2.3  --   --  5.6  HGB 9.4* 9.2* 10.1* 9.6* 9.9*  HCT 28.8* 28.4* 31.1* 30.4* 30.8*  MCV 95.7 96.3 96.6 97.4 96.6  PLT 347 353 349 367 367    Basic Metabolic Panel: Recent Labs  Lab 12/05/23 0343 12/06/23 0352 12/07/23 0334  12/08/23 0530 12/08/23 1757 12/09/23 0410  NA 140 138 141 141  --  139  K 3.6 3.2* 3.9 3.2* 4.2 4.2  CL 101 101 105 108  --  109  CO2 27 26 28 27   --  21*  GLUCOSE 94 107* 88 136*  --  97  BUN 12 8 17 16   --  15  CREATININE 0.68 0.60* 0.50* 0.52*  --  0.47*  CALCIUM 8.4* 8.5* 8.6* 8.5*  --  8.5*  MG 2.2 2.4 2.0 2.0 2.0 1.7  PHOS 5.2* 4.4 4.1  --  3.7 3.1   GFR: Estimated Creatinine Clearance: 120.6 mL/min (A) (by C-G formula based on SCr of 0.47 mg/dL (L)). Recent Labs  Lab 12/06/23 0352 12/07/23 0334 12/08/23 0530 12/08/23 1756 12/08/23 1757 12/09/23 0410  PROCALCITON  --   --   --   --  <0.10  --   WBC 4.3 4.7 4.6  --   --  7.7  LATICACIDVEN  --   --   --  1.4  --   --     Liver Function Tests: Recent Labs  Lab 12/07/23 0334 12/09/23 0410  ALBUMIN 2.2* 2.2*    No results for input(s): "LIPASE", "AMYLASE" in the last 168 hours. No results for input(s): "AMMONIA" in the last 168 hours.  ABG    Component Value Date/Time   PHART 7.44 12/01/2023 1143   PCO2ART 34 12/01/2023 1143   PO2ART 63 (L) 12/01/2023 1143   HCO3 23.1 12/01/2023 1143   TCO2 28 05/31/2021 0404   ACIDBASEDEF 0.5 12/01/2023 1143   O2SAT 93 12/01/2023 1143     Coagulation Profile: Recent Labs  Lab 12/03/23 1018 12/05/23 0344 12/07/23 0334  INR 1.0 1.0 1.0     Cardiac Enzymes: No results for input(s): "CKTOTAL", "CKMB", "CKMBINDEX", "TROPONINI" in  the last 168 hours.  HbA1C: Hgb A1c MFr Bld  Date/Time Value Ref Range Status  04/11/2022 06:10 AM 5.1 4.8 - 5.6 % Final    Comment:    (NOTE) Pre diabetes:          5.7%-6.4%  Diabetes:              >6.4%  Glycemic control for   <7.0% adults with diabetes   05/31/2021 04:18 PM 4.9 4.8 - 5.6 % Final    Comment:    (NOTE) Pre diabetes:          5.7%-6.4%  Diabetes:              >6.4%  Glycemic control for   <7.0% adults with diabetes     CBG: Recent Labs  Lab 12/09/23 0352 12/09/23 0815 12/09/23 1131 12/09/23 1520  12/09/23 1949  GLUCAP 101* 99 77 105* 85    Review of Systems:   Unable to assess pt nonverbal/intubated/sedated  Past Medical History:  He,  has a past medical history of 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   Surgical History:   Past Surgical History:  Procedure Laterality Date   GASTROSTOMY TUBE PLACEMENT     IR GASTROSTOMY TUBE MOD SED  12/05/2023     Social History:   reports that he has never smoked. He has never used smokeless tobacco. He reports that he does not currently use alcohol. He reports that he does not use drugs.   Family History:  His family history is not on file.   Allergies No Known Allergies   Home Medications  Prior to Admission medications   Medication Sig Start Date End Date Taking? Authorizing Provider  baclofen (LIORESAL) 10 MG tablet Take 10 mg by mouth 3 (three) times daily.   Yes [provider]  diazePAM, 15 MG Dose, (VALTOCO 15 MG DOSE) 2 x 7.5 MG/0.1ML LQPK Spray 7.5 mg into each nostril in the event of a seizure 04/20/22  Yes Zigmund Daniel., MD  gabapentin (NEURONTIN) 400 MG capsule Take 1 capsule (400 mg total) by mouth 3 (three) times daily. 04/20/22 11/20/23 Yes Zigmund Daniel., MD  lacosamide 100 MG TABS Take 1 tablet (100 mg total) by mouth 2 (two) times daily. 04/20/22 11/20/23 Yes Zigmund Daniel., MD  melatonin 5 MG TABS Take 10 mg by mouth at bedtime.   Yes [provider]  QUEtiapine (SEROQUEL XR) 200 MG 24 hr tablet Take 200 mg by mouth in the morning.   Yes [provider]  QUEtiapine (SEROQUEL) 25 MG tablet Take 25 mg by mouth at bedtime.   Yes [provider]  valproic acid (DEPAKENE) 250 MG capsule Take 4 capsules (1,000 mg total) by mouth 2 (two) times daily. Patient taking differently: Take 500 mg by mouth every morning. 04/20/22 11/20/23 Yes Zigmund Daniel., MD  valproic  acid (DEPAKENE) 250 MG capsule Take 750 mg by mouth every evening.   Yes [provider]   Scheduled Meds:  baclofen  10 mg Per Tube TID   Chlorhexidine Gluconate Cloth  6 each Topical PC lunch   enoxaparin (LOVENOX) injection  40 mg Subcutaneous Q24H   feeding supplement (PROSource TF20)  60 mL Per Tube Daily   free water  100 mL Per Tube Q4H   lacosamide  100 mg Per Tube BID   levalbuterol  0.63 mg Nebulization Q6H  metoprolol tartrate  12.5 mg Per Tube BID   mupirocin ointment  1 Application Nasal BID   nutrition supplement (JUVEN)  1 packet Per Tube BID BM   mouth rinse  15 mL Mouth Rinse Q2H   oxyCODONE  5 mg Per Tube Q6H   pantoprazole (PROTONIX) IV  40 mg Intravenous Q12H   polyethylene glycol  17 g Per Tube Daily   QUEtiapine  50 mg Per Tube BID   senna  1 tablet Per Tube Daily   sodium chloride flush  10-40 mL Intracatheter Q12H   valproic acid  500 mg Per Tube Daily   valproic acid  750 mg Per Tube QHS   Continuous Infusions:  feeding supplement (OSMOLITE 1.5 CAL) 20 mL/hr at 12/09/23 1507   norepinephrine (LEVOPHED) Adult infusion Stopped (12/08/23 1130)   piperacillin-tazobactam (ZOSYN)  IV 3.375 g (12/09/23 2220)   vancomycin 1,250 mg (12/09/23 2221)   PRN Meds:.acetaminophen **OR** acetaminophen, bisacodyl, lip balm, metoprolol tartrate, ondansetron **OR** ondansetron (ZOFRAN) IV, mouth rinse, sodium chloride flush   Critical care time: 40 minutes    Webb Silversmith, DNP, CCRN, FNP-C, AGACNP-BC Acute Care & Family Nurse Practitioner  [facility]  See Amion for personal pager PCCM on call pager [phone] until 7 am

## 2023-12-11 DIAGNOSIS — I959 Hypotension, unspecified: Secondary | ICD-10-CM | POA: Diagnosis not present

## 2023-12-11 DIAGNOSIS — J9601 Acute respiratory failure with hypoxia: Secondary | ICD-10-CM | POA: Diagnosis not present

## 2023-12-11 LAB — BASIC METABOLIC PANEL
Anion gap: 11 (ref 5–15)
BUN: 16 mg/dL (ref 6–20)
CO2: 23 mmol/L (ref 22–32)
Calcium: 8.7 mg/dL — ABNORMAL LOW (ref 8.9–10.3)
Chloride: 104 mmol/L (ref 98–111)
Creatinine, Ser: 0.47 mg/dL — ABNORMAL LOW (ref 0.61–1.24)
GFR, Estimated: >60 mL/min (ref >60)
Glucose, Bld: 139 mg/dL — ABNORMAL HIGH (ref 70–99)
Potassium: 3.6 mmol/L (ref 3.5–5.1)
Sodium: 138 mmol/L (ref 135–145)

## 2023-12-11 LAB — HEPATIC FUNCTION PANEL
ALT: 8 U/L (ref 0–44)
AST: 12 U/L — ABNORMAL LOW (ref 15–41)
Albumin: 2.3 g/dL — ABNORMAL LOW (ref 3.5–5.0)
Alkaline Phosphatase: 67 U/L (ref 38–126)
Bilirubin, Direct: 0.1 mg/dL (ref 0.0–0.2)
Indirect Bilirubin: 0.4 mg/dL (ref 0.3–0.9)
Total Bilirubin: 0.5 mg/dL (ref 0.0–1.2)
Total Protein: 6.3 g/dL — ABNORMAL LOW (ref 6.5–8.1)

## 2023-12-11 LAB — CULTURE, RESPIRATORY W GRAM STAIN

## 2023-12-11 LAB — GLUCOSE, CAPILLARY
Glucose-Capillary: 107 mg/dL — ABNORMAL HIGH (ref 70–99)
Glucose-Capillary: 108 mg/dL — ABNORMAL HIGH (ref 70–99)
Glucose-Capillary: 83 mg/dL (ref 70–99)
Glucose-Capillary: 94 mg/dL (ref 70–99)
Glucose-Capillary: 96 mg/dL (ref 70–99)
Glucose-Capillary: 96 mg/dL (ref 70–99)

## 2023-12-11 LAB — CBC
HCT: 30.6 % — ABNORMAL LOW (ref 39.0–52.0)
Hemoglobin: 10.2 g/dL — ABNORMAL LOW (ref 13.0–17.0)
MCH: 31.5 pg (ref 26.0–34.0)
MCHC: 33.3 g/dL (ref 30.0–36.0)
MCV: 94.4 fL (ref 80.0–100.0)
Platelets: 344 10*3/uL (ref 150–400)
RBC: 3.24 MIL/uL — ABNORMAL LOW (ref 4.22–5.81)
RDW: 13.5 % (ref 11.5–15.5)
WBC: 4.7 10*3/uL (ref 4.0–10.5)
nRBC: 0 % (ref 0.0–0.2)

## 2023-12-11 LAB — MAGNESIUM: Magnesium: 2.1 mg/dL (ref 1.7–2.4)

## 2023-12-11 LAB — PHOSPHORUS: Phosphorus: 3.3 mg/dL (ref 2.5–4.6)

## 2023-12-11 MED ORDER — FREE WATER
30.0000 mL | Status: DC
  Administered 2023-12-11 – 2024-05-14 (×928): 30 mL

## 2023-12-11 NOTE — Progress Notes (Signed)
 NAME:  Alex [name], MRN:  [id number], DOB:  [birth year], LOS: 22 ADMISSION DATE:  11/19/2023, CONSULTATION DATE:  11/20/23 REFERRING MD:  Dr. Arville Care, CHIEF COMPLAINT:  AMS   Brief Pt Description / Synopsis:  28 y.o. male with PMHx significant for chronic TBI with left sided hemiparesis admitted with Acute Metabolic Encephalopathy and Acute Hypoxic Respiratory Failure in the setting of aspiration and Pseudomonas and MRSA Pneumonia requiring intubation and mechanical ventilation. With recurrent aspiration events, to undergo Tracheostomy placement with ENT.  History of Present Illness:  28 yo M presenting to [facility] ED from outpatient rehab on 11/19/23 for evaluation of altered mental status.   History obtained per chart review and mother's telephone interview, patient unable to participate in interview due to respiratory distress and baseline TBI. 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 He was treated at [facility] for 5 months then discharged to rehab. Per his mother and legal guardian his baseline is: Non verbal but he will yell out sporadic nonsensical words with left sided paralysis. He is able to move his RUE in order to feed himself with finger foods and he has some involuntary movement with that arm, he will swat at you. Similar baseline function with RLE, he can kick and move, but often will lay it bent and to the side. He has enough strength to even try and get out of bed on the right side. She denies any issues with swallowing, but eats mostly soft food. If he is not watched closely with eating he will try to eat all the food at once. The facility staff reported he was at his normal baseline until lunchtime on 11/19/23. It was observed he was more somnolent and not interactive. Staff noted a cough and slightly increased work of breathing. Mom also confirmed noting a congested cough the last time she visited earlier in the week. Staff and mom denied nausea/ vomiting, but mom  reports chronic loose stools.   EMS reported the patient febrile and tachycardic on arrival.   ED course: Upon arrival patient tachycardic and lethargic. Sepsis protocol initiated with antibiotics and IVF resuscitation. Labs significant for mild hypokalemia, otherwise WNL. Initial imaging unremarkable but then patient became hypoxic with SpO2 85% on RA and a CTa was obtained, negative for PE but concerning for aspiration pneumonia.  Medications given: Cefepime, Flagyl, Vancomycin, 2 L IVF bolus, IV contrast Initial Vitals: 97.9, 17, 130, 119/82 & 92 on RA Significant labs: (Labs/ Imaging personally reviewed) EKG : pending Chemistry: Na+: 138, K+: 3.4, BUN/Cr.: 13/ 0.65, Serum CO2/ AG: 26/8 Hematology: WBC: 4.3, Hgb: 14.3,  Lactic/ PCT: 1.1 > 1.4 / pending, COVID-19 & Influenza A/B: negative   CXR 11/19/23: no active disease CT head wo contrast 11/19/23: No evidence of acute intracranial abnormality. Stable multifocal encephalomalacia, likely the sequela of prior trauma. Similar age-advanced cerebral atrophy CT angio chest PE 11/19/23:  No pulmonary embolus. Near complete filling of the right mainstem bronchus with filling extending into the bronchus intermedius, right upper, middle and lower lobe bronchi. Layering debris in the trachea. Findings are highly suspicious for aspiration. Minimal ill-defined patchy and nodular airspace disease in the dependent right lower lobe, likely postobstructive pneumonia.   Disposition: Admitted to [facility] service. While holding in the ED, respiratory status worsened and due to high risk for decompensation PCCM consulted and patient intubated for airway protection.   Please see "Significant Hospital Events" section below for full detailed hospital course.   Pertinent  Medical History  Seizures TBI (09/2019)  Micro Data:  1/27: SARS-CoV-2/flu/RSV PCR>> negative 1/27: Blood culture x 2>> no growth 1/28: MRSA PCR>>Positive  1/28: Tracheal aspirate>>Pseudomonas  aeruginosa and Staphylococcus aureus 1/28: HIV screen>> nonreactive 1/29: BAL>>Pseudomonas aeruginosa & MRSA 2/07: Cdiff>>negative  2/15: Blood x2>>NGTD  2/15: Tracheal aspirate>>moderate gram variable rod   Antimicrobials:   Anti-infectives (From admission, onward)    Start     Dose/Rate Route Frequency Ordered Stop   12/10/23 1100  linezolid (ZYVOX) tablet 600 mg        600 mg Per Tube Every 12 hours 12/10/23 1014     12/08/23 1915  vancomycin (VANCOREADY) IVPB 1250 mg/250 mL  Status:  Discontinued        1,250 mg 166.7 mL/hr over 90 Minutes Intravenous 2 times daily 12/08/23 1821 12/10/23 1013   12/08/23 1915  piperacillin-tazobactam (ZOSYN) IVPB 3.375 g        3.375 g 12.5 mL/hr over 240 Minutes Intravenous Every 8 hours 12/08/23 1821     12/05/23 1443  ceFAZolin (ANCEF) IVPB 1 g/50 mL premix        over 30 Minutes  Continuous PRN 12/05/23 1443 12/05/23 1443   12/05/23 0000  ceFAZolin (ANCEF) IVPB 2g/100 mL premix        2 g 200 mL/hr over 30 Minutes Intravenous To Radiology 12/04/23 1324 12/05/23 0041   12/01/23 2200  vancomycin (VANCOREADY) IVPB 750 mg/150 mL  Status:  Discontinued        750 mg 150 mL/hr over 60 Minutes Intravenous Every 8 hours 12/01/23 1205 12/01/23 1215   12/01/23 1400  vancomycin (VANCOREADY) IVPB 1250 mg/250 mL  Status:  Discontinued        1,250 mg 166.7 mL/hr over 90 Minutes Intravenous  Once 12/01/23 1205 12/01/23 1215   12/01/23 1315  linezolid (ZYVOX) IVPB 600 mg        600 mg 300 mL/hr over 60 Minutes Intravenous Every 12 hours 12/01/23 1215 12/07/23 2253   12/01/23 0230  piperacillin-tazobactam (ZOSYN) IVPB 3.375 g        3.375 g 12.5 mL/hr over 240 Minutes Intravenous Every 8 hours 12/01/23 0131 12/07/23 1944   11/30/23 1500  levofloxacin (LEVAQUIN) IVPB 750 mg  Status:  Discontinued        750 mg 100 mL/hr over 90 Minutes Intravenous Every 24 hours 11/30/23 0345 12/01/23 0048   11/28/23 0100  vancomycin (VANCOREADY) IVPB 1250 mg/250 mL        Placed in "Followed by" Linked Group   1,250 mg 166.7 mL/hr over 90 Minutes Intravenous Every 12 hours 11/27/23 1153 11/29/23 1331   11/27/23 1300  vancomycin (VANCOREADY) IVPB 1500 mg/300 mL       Placed in "Followed by" Linked Group   1,500 mg 150 mL/hr over 120 Minutes Intravenous  Once 11/27/23 1153 11/27/23 1516   11/23/23 1500  levofloxacin (LEVAQUIN) IVPB 750 mg        750 mg 100 mL/hr over 90 Minutes Intravenous Every 24 hours 11/23/23 1341 11/29/23 1551   11/21/23 2200  doxycycline (VIBRAMYCIN) 100 mg in sodium chloride 0.9 % 250 mL IVPB  Status:  Discontinued        100 mg 125 mL/hr over 120 Minutes Intravenous Every 12 hours 11/21/23 1425 11/27/23 1148   11/21/23 2000  metroNIDAZOLE (FLAGYL) IVPB 500 mg  Status:  Discontinued        500 mg 100 mL/hr over 60 Minutes Intravenous Every 12 hours 11/21/23 1425 11/24/23 1227  11/20/23 1200  Ampicillin-Sulbactam (UNASYN) 3 g in sodium chloride 0.9 % 100 mL IVPB  Status:  Discontinued        3 g 200 mL/hr over 30 Minutes Intravenous Every 6 hours 11/20/23 1131 11/21/23 1423   11/20/23 0800  cefTRIAXone (ROCEPHIN) 2 g in sodium chloride 0.9 % 100 mL IVPB  Status:  Discontinued        2 g 200 mL/hr over 30 Minutes Intravenous Every 24 hours 11/19/23 2322 11/20/23 0229   11/20/23 0100  azithromycin (ZITHROMAX) 500 mg in sodium chloride 0.9 % 250 mL IVPB  Status:  Discontinued        500 mg 250 mL/hr over 60 Minutes Intravenous Every 24 hours 11/19/23 2322 11/21/23 1423   11/19/23 1545  ceFEPIme (MAXIPIME) 2 g in sodium chloride 0.9 % 100 mL IVPB        2 g 200 mL/hr over 30 Minutes Intravenous  Once 11/19/23 1541 11/19/23 1630   11/19/23 1545  metroNIDAZOLE (FLAGYL) IVPB 500 mg        500 mg 100 mL/hr over 60 Minutes Intravenous  Once 11/19/23 1541 11/19/23 1748   11/19/23 1545  vancomycin (VANCOCIN) IVPB 1000 mg/200 mL premix        1,000 mg 200 mL/hr over 60 Minutes Intravenous  Once 11/19/23 1541 11/19/23 1917       Significant Hospital Events: Including procedures, antibiotic start and stop dates in addition to other pertinent events   11/20/23: Admit to ICU due to acute hypoxic respiratory failure requiring urgent intubation and mechanical ventilatory support secondary to suspected aspiration and pneumonia 11/21/23- patient moving RUE, mother at bedside we reviewed medical plan. He remains on 90mcg/kg/hr levophed, today plan to rescusitate more aggresively and wean from levophed and potentially extubate post SBP.   He is febrile this am.  He is on zithromax, unasyn 11/22/23- s/p bronch yesterday with aspiration of mucus plugging.  Today resp status improved with liberation protocol in proces. SLP post extubation today. 11/23/23- patient is +for pseudomonas resp cultures.  He is also MRSA pcr +, refined therapy during rounds with pharmacist. He remains on MV 11/24/23- patient is still on vasopressor support weaning down on MV.  Secretions are slightly better.  11/25/23- patient weaned off levophed, failed SBT with tachypnea, tachycardia.  Secretions much improved. 11/26/23- on minimal vent support, unable to perform SBT due to copious secretions from ETT.   11/27/23- on minimal vent support, secretions improved.  Change Doxycycline to Vancomycin.  Plan for SBT as tolerated. 11/28/23-Pt successfully extubated 02/4, currently tolerating HHFNC @35L /35%.  Required low dose precedex overnight to allow suctioning due to excessive secretions. Pt with suspected seizure activity developed tremors in the right upper extremity and became minimally responsive.  Received 1g of iv keppra.    11/29/23-Overnight pt developed sinus tachycardia/svt 140 to 160's improved following 2.5 mg iv metoprolol.  Tolerating RA with no signs of respiratory distress.  Transferring to progressive care unit TRH to pick on 02/7 12/01/23: Pt transferred back to ICU with severe acute hypoxic respiratory failure initially placed on HHFNC but due to significant  hypoxia O2 sats in the 60's he required reintubation and underwent emergent bronchoscopy which revealed thick secretions in the LUL and LLL resulting in mucous plugging. Therapeutic aspiration performed. CXR showed collapse of the LUL secondary to mucus plugging  12/03/23: Pt remains mechanically intubated on minimal vent settings.  ENT consulted for tracheostomy placement due to recurrent respiratory failure due to inability to clear secretions.  Requiring levophed gtt to maintain map 65 or higher  12/04/23: No acute events overnight on minimal settings pending tracheostomy placement  12/05/23: No acute events overnight, on minimal vent support. Initially on low dose Levophed, now weaned off.  IR placed PEG tube. Pending Tracheostomy placement on Friday. 12/06/23: No acute events overnight, on minimal vent support. Awaiting Tracheostomy placement tomorrow. 12/07/23: No acute events overnight.  Tracheostomy placed this morning by ENT, no events with procedure. Will keep sedated today with newly placed Trach with plan for WUA/SBT tomorrow. 12/08/23: All sedation turned off for WUA will perform SBT or TCT as tolerated.  Pt spike at temp 100.9 F, hypotensive requiring levophed gtt, and tachycardic concerning for possible sepsis.  Blood culture/tracheal aspirate and UA sent.  Restarted broad spectrum abx (vancomycin/zosyn) 12/09/23: Pt no longer requiring levophed gtt.  Tmax 102.4 F 12/10/23: Central Line Removed, not requiring pressors or sedation. Tolearting TC. 12/11/23: Overnight was started back on Gabapentin and scheduled Propanol for possible neurostorming with significant improvement in HR from the 170's to mid 100's  Interim History / Subjective:  As outlined above under significant events   Objective   Blood pressure 104/74, pulse (!) 102, temperature 98.9 F (37.2 C), temperature source Axillary, resp. rate 20, height 5\' 9"  (1.753 m), weight 63.4 kg, SpO2 100%.    Vent Mode: PRVC FiO2 (%):  [28 %]  28 % Set Rate:  [20 bmp] 20 bmp Vt Set:  [450 mL] 450 mL PEEP:  [5 cmH20] 5 cmH20 Pressure Support:  [5 cmH20-10 cmH20] 5 cmH20 Plateau Pressure:  [13 cmH20] 13 cmH20   Intake/Output Summary (Last 24 hours) at 12/11/2023 0427 Last data filed at 12/10/2023 2000 Gross per 24 hour  Intake 1113.88 ml  Output 640 ml  Net 473.88 ml   Filed Weights   12/08/23 0749 12/09/23 0700 12/10/23 0500  Weight: 64.9 kg 62 kg 63.4 kg   Examination: General: Acute on chronically-ill appearing frail male, NAD mechanically intubated   HENT: Atraumatic, normocephalic, neck supple, no JVD, Tracheostomy in place clean dry and intact Lungs: Faint rhonchi throughout, even, non labored, synchronous with the vent Cardiovascular: Sinus tachycardia, s1s2, no m/r/g, 2+ radial/2+ distal pulses, trace generalized edema  Abdomen: +BS x4, soft, non distended  Extremities: Left upper and lower extremities contractured, bilateral foot drop Neuro: Sedated, Left-sided hemiparesis with left arm contractured (baseline), opens eyes to voice and tracks GU: Indwelling foley catheter in place draining dark yellow urine  Resolved Hospital Problem list     Assessment & Plan:   #Sinus tachycardia/SVT  #Hypotension secondary to sedating medication and possible recurrent sepsis  - Continuous telemetry monitoring - stopped Metoprolol and started scheduled propanolol for possible neurostorming with significant Improvement - IV fluid resuscitation prn and/or vasopressors to maintain map 65 or higher  - Lactic acid is normalized - HS Troponin negative x2  #Acute hypoxic respiratory failure secondary to aspiration, LUL/LLL mucous plugging, and collapse of the LUL due to mucous plugging in the setting of TBI s/p emergent bronchoscopy 02/8 #Mechanical ventilation  Status post Tracheostomy placement with ENT on 12/06/22 - Full vent support for now: vent settings reviewed and established  - SBT or TCT as tolerated  - Continue lung  protective strategies  - Maintain plateau pressures less than 30 cm H2O - Maintain O2 sats 92% or higher  - Prn bronchodilator therapy  - Aggressive pulmonary hygiene - ABX as above  #Pseudomonas & MRSA pneumonia~treated  #Aspiration pneumonia~treated   #Possible recurrent sepsis  -  Trend WBC and monitor fever curve  - Trend PCT  - Follow cultures  - Restarted broad spectrum abx (vancomycin and zosyn) pending culture results and sensitivities   #Chronic Seizure Disorder #Chronic TBI #Sedation needs in setting of mechanical ventilation EEG 02/10: obtained while sedated on propofol and comatose and is abnormal due to severe diffuse slowing indicative of global cerebral dysfunction, medication effect, or both. Epileptiform abnormalities were not seen during this recording.  - Maintain a RASS goal of 0  - scheduled oxycodone per tube 02/15  - Avoid sedating medications as able - Daily wake up assessment - Continue outpatient vimpat and valproic acid  - Continue outpatient baclofen , seroquel and Gabapentin - Seizure precautions   #Dysphagia - TF's via G-tube   Best Practice (right click and "Reselect all SmartList Selections" daily)   Diet/type: TF's  DVT prophylaxis: resume LMWH  GI prophylaxis: PPI Lines: right internal jugular central line if able to get peripheral iv's will remove central line  Foley: yes, and still needed due to urinary retention  Code Status:  full code Last date of multidisciplinary goals of care discussion [12/11/23]  Labs   CBC: Recent Labs  Lab 12/06/23 0352 12/07/23 0334 12/08/23 0530 12/09/23 0410 12/10/23 0515 12/11/23 0335  WBC 4.3 4.7 4.6 7.7 7.2 4.7  NEUTROABS 2.3  --   --  5.6 4.7  --   HGB 9.2* 10.1* 9.6* 9.9* 10.0* 10.2*  HCT 28.4* 31.1* 30.4* 30.8* 31.1* 30.6*  MCV 96.3 96.6 97.4 96.6 96.9 94.4  PLT 353 349 367 367 272 344   Basic Metabolic Panel: Recent Labs  Lab 12/06/23 0352 12/07/23 0334 12/08/23 0530 12/08/23 1757  12/09/23 0410 12/10/23 0500 12/10/23 0515  NA 138 141 141  --  139 139  --   K 3.2* 3.9 3.2* 4.2 4.2 3.4*  --   CL 101 105 108  --  109 107  --   CO2 26 28 27   --  21* 25  --   GLUCOSE 107* 88 136*  --  97 102*  --   BUN 8 17 16   --  15 13  --   CREATININE 0.60* 0.50* 0.52*  --  0.47* 0.53*  --   CALCIUM 8.5* 8.6* 8.5*  --  8.5* 8.2*  --   MG 2.4 2.0 2.0 2.0 1.7  --  2.1  PHOS 4.4 4.1  --  3.7 3.1 3.9  --    GFR: Estimated Creatinine Clearance: 123.3 mL/min (A) (by C-G formula based on SCr of 0.53 mg/dL (L)). Recent Labs  Lab 12/08/23 0530 12/08/23 1756 12/08/23 1757 12/09/23 0410 12/10/23 0515 12/11/23 0335  PROCALCITON  --   --  <0.10  --   --   --   WBC 4.6  --   --  7.7 7.2 4.7  LATICACIDVEN  --  1.4  --   --   --   --    Liver Function Tests: Recent Labs  Lab 12/07/23 0334 12/09/23 0410  ALBUMIN 2.2* 2.2*   No results for input(s): "LIPASE", "AMYLASE" in the last 168 hours. No results for input(s): "AMMONIA" in the last 168 hours.  ABG    Component Value Date/Time   PHART 7.44 12/01/2023 1143   PCO2ART 34 12/01/2023 1143   PO2ART 63 (L) 12/01/2023 1143   HCO3 23.1 12/01/2023 1143   TCO2 28 05/31/2021 0404   ACIDBASEDEF 0.5 12/01/2023 1143   O2SAT 93 12/01/2023 1143   Coagulation Profile: Recent Labs  Lab 12/05/23 0344 12/07/23 0334  INR 1.0 1.0   Cardiac Enzymes: No results for input(s): "CKTOTAL", "CKMB", "CKMBINDEX", "TROPONINI" in the last 168 hours.  HbA1C: Hgb A1c MFr Bld  Date/Time Value Ref Range Status  04/11/2022 06:10 AM 5.1 4.8 - 5.6 % Final    Comment:    (NOTE) Pre diabetes:          5.7%-6.4%  Diabetes:              >6.4%  Glycemic control for   <7.0% adults with diabetes   05/31/2021 04:18 PM 4.9 4.8 - 5.6 % Final    Comment:    (NOTE) Pre diabetes:          5.7%-6.4%  Diabetes:              >6.4%  Glycemic control for   <7.0% adults with diabetes    CBG: Recent Labs  Lab 12/10/23 1225 12/10/23 1558  12/10/23 1935 12/10/23 2331 12/11/23 0422  GLUCAP 106* 125* 136* 160* 107*    Review of Systems:   Unable to assess pt nonverbal/intubated/sedated  Past Medical History:  He,  has a past medical history of 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   Surgical History:   Past Surgical History:  Procedure Laterality Date   GASTROSTOMY TUBE PLACEMENT     IR GASTROSTOMY TUBE MOD SED  12/05/2023   TRACHEOSTOMY TUBE PLACEMENT N/A 12/07/2023   Procedure: TRACHEOSTOMY;  Surgeon: Lanell Persons, MD;  Location: [facility] ORS;  Service: ENT;  Laterality: N/A;     Social History:   reports that he has never smoked. He has never used smokeless tobacco. He reports that he does not currently use alcohol. He reports that he does not use drugs.   Family History:  His family history is not on file.   Allergies No Known Allergies   Home Medications  Prior to Admission medications   Medication Sig Start Date End Date Taking? Authorizing Provider  baclofen (LIORESAL) 10 MG tablet Take 10 mg by mouth 3 (three) times daily.   Yes [provider]  diazePAM, 15 MG Dose, (VALTOCO 15 MG DOSE) 2 x 7.5 MG/0.1ML LQPK Spray 7.5 mg into each nostril in the event of a seizure 04/20/22  Yes Zigmund Daniel., MD  gabapentin (NEURONTIN) 400 MG capsule Take 1 capsule (400 mg total) by mouth 3 (three) times daily. 04/20/22 11/20/23 Yes Zigmund Daniel., MD  lacosamide 100 MG TABS Take 1 tablet (100 mg total) by mouth 2 (two) times daily. 04/20/22 11/20/23 Yes Zigmund Daniel., MD  melatonin 5 MG TABS Take 10 mg by mouth at bedtime.   Yes [provider]  QUEtiapine (SEROQUEL XR) 200 MG 24 hr tablet Take 200 mg by mouth in the morning.   Yes [provider]  QUEtiapine (SEROQUEL) 25 MG tablet Take 25 mg by mouth at bedtime.   Yes [provider]  valproic acid (DEPAKENE) 250 MG  capsule Take 4 capsules (1,000 mg total) by mouth 2 (two) times daily. Patient taking differently: Take 500 mg by mouth every morning. 04/20/22 11/20/23 Yes Zigmund Daniel., MD  valproic acid (DEPAKENE) 250 MG capsule Take 750 mg by mouth every evening.   Yes [provider]   Scheduled Meds:  baclofen  10 mg Per Tube TID   Chlorhexidine Gluconate Cloth  6 each Topical PC lunch  enoxaparin (LOVENOX) injection  40 mg Subcutaneous Q24H   feeding supplement (PROSource TF20)  60 mL Per Tube Daily   free water  100 mL Per Tube Q4H   gabapentin  300 mg Per Tube Q8H   lacosamide  100 mg Per Tube BID   linezolid  600 mg Per Tube Q12H   mupirocin ointment  1 Application Nasal BID   nutrition supplement (JUVEN)  1 packet Per Tube BID BM   mouth rinse  15 mL Mouth Rinse Q2H   oxyCODONE  5 mg Per Tube Q6H   pantoprazole (PROTONIX) IV  40 mg Intravenous Q12H   propranolol  20 mg Per Tube Q8H   QUEtiapine  50 mg Per Tube TID   senna  1 tablet Per Tube Daily   sodium chloride flush  10-40 mL Intracatheter Q12H   valproic acid  500 mg Per Tube Daily   valproic acid  750 mg Per Tube QHS   Continuous Infusions:  feeding supplement (OSMOLITE 1.5 CAL) 30 mL/hr at 12/10/23 2000   piperacillin-tazobactam (ZOSYN)  IV 3.375 g (12/10/23 2116)   PRN Meds:.acetaminophen **OR** acetaminophen, bisacodyl, glycopyrrolate, levalbuterol, lip balm, ondansetron **OR** ondansetron (ZOFRAN) IV, mouth rinse, polyethylene glycol, sodium chloride flush   Critical care time: 40 minutes    Webb Silversmith, DNP, CCRN, FNP-C, AGACNP-BC Acute Care & Family Nurse Practitioner  [facility]  See Amion for personal pager PCCM on call pager [phone] until 7 am

## 2023-12-11 NOTE — Progress Notes (Signed)
 ICU Progress Note  -  Otolaryngology-Head & Neck Surgery  Attending:  Magnus Ivan. Okey Dupre, MD, MBA, FARS   CHIEF COMPLAINT:   Chief Complaint  Patient presents with   Altered Mental Status     HPI:  The patient is a 28 y.o. old child who presents today with complaint of  Chief Complaint  Patient presents with   Altered Mental Status    Mr. Outman is doing well on the vent with cuffed, #6 tracheostomy tube in place.  Planning first trach tube change and suture removal today.  Discussed with team.    REVIEW OF SYSTEMS:  The patient / family denies any recent history of fever, night sweats or weight loss, pain, cyanosis, clubbing or edema, respiratory distress, dizziness or imbalance.   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 14, 2025   PHYSICAL EXAM: BP 103/69   Pulse 100   Temp 98.2 F (36.8 C)   Resp (!) 22   Ht 5\' 9"  (1.753 m)   Wt 64.1 kg   SpO2 100%   BMI 20.87 kg/m    The patient appeared healthy and in no accute distress.    There was no respiratory distress, stridor or retractions.    Skin exam of the scalp, face and neck appeared normal.  The ear exam revealed patent external auditory canals and tympanic membranes to be clear bilaterally without perforation, drainage, middle ear fluid or evidence of acute infection.    The nose was patent bilaterally with no purulent drainage, polyps or mass lesions.  The nasal septum appeared midline.  Oral cavity exam revealed no mass or mucosal lesions, erythema or exudate.    The neck was nontender, without palpable adenopathy or mass lesion.  Tracheotomy site without skin breakdown - no bleeding.  #6 cuffed tube in place and patent.    Neurologic exam revealed cranial nerves  II-XII to be grossly intact and symmetic, including the 7th cranial nerve, which was intact and symmetric bilaterally.   PROCEDURE NOTE  Procedure: Tracheostomy tube change (CPT [phone])  Consent: After discussion of the risks of the procedure (primarily pain and bleeding) and obtaining informed verbal consent, a time-out was performed confirming the patient's name, birthdate, and procedure to be performed.  Surgeon: Magnus Ivan. Okey Dupre, MD, MBA, FARS  Anesthesia:  None  Complications: None   Procedure: Sutures were removed and, with the assistance of RT, the tracheostomy tube was carefully removed after putting the balloon down and a new, fresh #6 cuffed tracheostomy tube placed.    Findings:   The trach tube is a good size for the airway. No airway collapse or tracheo / bronchomalacia. No bleeding or purulent secretions. No significant suction trauma. No granulation tissue at the trach site or other obstructions.  Wound healing well - Bjork flap intact - suture left in place as long term tracheotomy dependence anticipated  Medical Decision Making  ASSESSMENT :  Mr. Emily is doing well on the vent with cuffed, #6 tracheostomy tube in place - sutures removed today and trach changed without difficulty.   PLAN:   Continued routine tracheostomy tube care - please call Dr. Okey Dupre / ENT with any questions / concerns going forward.    Magnus Ivan. Okey Dupre, MD, MBA, FARS Otolaryngology-Head & Neck  [facility] [phone]
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
## 2023-12-12 DIAGNOSIS — J9601 Acute respiratory failure with hypoxia: Secondary | ICD-10-CM | POA: Diagnosis not present

## 2023-12-12 DIAGNOSIS — G9341 Metabolic encephalopathy: Secondary | ICD-10-CM | POA: Diagnosis not present

## 2023-12-12 DIAGNOSIS — J690 Pneumonitis due to inhalation of food and vomit: Secondary | ICD-10-CM | POA: Diagnosis not present

## 2023-12-12 DIAGNOSIS — I952 Hypotension due to drugs: Secondary | ICD-10-CM | POA: Diagnosis not present

## 2023-12-12 LAB — RENAL FUNCTION PANEL
Albumin: 2.4 g/dL — ABNORMAL LOW (ref 3.5–5.0)
Anion gap: 10 (ref 5–15)
BUN: 16 mg/dL (ref 6–20)
CO2: 23 mmol/L (ref 22–32)
Calcium: 8.9 mg/dL (ref 8.9–10.3)
Chloride: 106 mmol/L (ref 98–111)
Creatinine, Ser: 0.54 mg/dL — ABNORMAL LOW (ref 0.61–1.24)
GFR, Estimated: >60 mL/min (ref >60)
Glucose, Bld: 94 mg/dL (ref 70–99)
Phosphorus: 3 mg/dL (ref 2.5–4.6)
Potassium: 4.9 mmol/L (ref 3.5–5.1)
Sodium: 139 mmol/L (ref 135–145)

## 2023-12-12 LAB — GLUCOSE, CAPILLARY
Glucose-Capillary: 106 mg/dL — ABNORMAL HIGH (ref 70–99)
Glucose-Capillary: 110 mg/dL — ABNORMAL HIGH (ref 70–99)
Glucose-Capillary: 131 mg/dL — ABNORMAL HIGH (ref 70–99)
Glucose-Capillary: 98 mg/dL (ref 70–99)
Glucose-Capillary: 99 mg/dL (ref 70–99)

## 2023-12-12 LAB — CBC
HCT: 34.8 % — ABNORMAL LOW (ref 39.0–52.0)
Hemoglobin: 11.3 g/dL — ABNORMAL LOW (ref 13.0–17.0)
MCH: 31.4 pg (ref 26.0–34.0)
MCHC: 32.5 g/dL (ref 30.0–36.0)
MCV: 96.7 fL (ref 80.0–100.0)
Platelets: 360 10*3/uL (ref 150–400)
RBC: 3.6 MIL/uL — ABNORMAL LOW (ref 4.22–5.81)
RDW: 13.4 % (ref 11.5–15.5)
WBC: 6.4 10*3/uL (ref 4.0–10.5)
nRBC: 0 % (ref 0.0–0.2)

## 2023-12-12 LAB — MAGNESIUM: Magnesium: 2.2 mg/dL (ref 1.7–2.4)

## 2023-12-12 MED ORDER — LACTATED RINGERS IV BOLUS
500.0000 mL | Freq: Once | INTRAVENOUS | Status: AC
  Administered 2023-12-12: 500 mL via INTRAVENOUS

## 2023-12-12 MED ORDER — ALBUMIN HUMAN 25 % IV SOLN
25.0000 g | Freq: Once | INTRAVENOUS | Status: AC
  Administered 2023-12-12: 25 g via INTRAVENOUS
  Filled 2023-12-12: qty 100

## 2023-12-12 MED ORDER — MIDODRINE HCL 5 MG PO TABS
5.0000 mg | ORAL_TABLET | Freq: Three times a day (TID) | ORAL | Status: DC
  Administered 2023-12-12 (×2): 5 mg
  Filled 2023-12-12 (×2): qty 1

## 2023-12-12 MED ORDER — MIDODRINE HCL 5 MG PO TABS
10.0000 mg | ORAL_TABLET | Freq: Three times a day (TID) | ORAL | Status: DC
  Administered 2023-12-13 – 2023-12-19 (×21): 10 mg
  Filled 2023-12-12 (×21): qty 2

## 2023-12-12 MED ORDER — MIDODRINE HCL 5 MG PO TABS
5.0000 mg | ORAL_TABLET | Freq: Once | ORAL | Status: AC
  Administered 2023-12-12: 5 mg
  Filled 2023-12-12: qty 1

## 2023-12-12 NOTE — TOC Progression Note (Signed)
 Transition of Care [facility]) - Progression Note    Patient Details  Name: Alex [name] MRN: [id number] Date of Birth: August 04, 1996  Transition of Care [facility]) CM/SW Contact  Chapman Fitch, RN Phone Number: 12/12/2023, 3:23 PM  Clinical Narrative:     Per Chart review Off pressors, minimal vent support Tanya at [facility] to check to see if patient will be able to return at discharge with trach    Expected Discharge Plan: Skilled Nursing Facility Barriers to Discharge: Continued Medical Work up  Expected Discharge Plan and Services       Living arrangements for the past 2 months: Skilled Nursing Facility                                       Social Determinants of Health (SDOH) Interventions SDOH Screenings   Food Insecurity: Patient Unable To Answer (11/20/2023)  Housing: Patient Unable To Answer (11/20/2023)  Transportation Needs: Patient Unable To Answer (11/20/2023)  Utilities: Patient Unable To Answer (11/20/2023)  Financial Resource Strain: Low Risk  (12/02/2021)   Received from [facility]  Tobacco Use: Low Risk  (11/19/2023)    Readmission Risk Interventions     No data to display

## 2023-12-12 NOTE — Plan of Care (Signed)
  Problem: Fluid Volume: Goal: Hemodynamic stability will improve Outcome: Progressing   Problem: Clinical Measurements: Goal: Diagnostic test results will improve Outcome: Progressing Goal: Signs and symptoms of infection will decrease Outcome: Progressing   Problem: Respiratory: Goal: Ability to maintain adequate ventilation will improve Outcome: Progressing   Problem: Activity: Goal: Ability to tolerate increased activity will improve Outcome: Progressing   Problem: Respiratory: Goal: Ability to maintain a clear airway and adequate ventilation will improve Outcome: Progressing

## 2023-12-12 NOTE — Progress Notes (Signed)
 NAME:  Alex [name], MRN:  [id number], DOB:  04-06-1996, LOS: 23 ADMISSION DATE:  11/19/2023, CONSULTATION DATE:  11/20/23 REFERRING MD:  Dr. Arville Care, CHIEF COMPLAINT:  AMS   Brief Pt Description / Synopsis:  28 y.o. male with PMHx significant for chronic TBI with left sided hemiparesis admitted with Acute Metabolic Encephalopathy and Acute Hypoxic Respiratory Failure in the setting of aspiration and Pseudomonas and MRSA Pneumonia requiring intubation and mechanical ventilation. With recurrent aspiration events, required Tracheostomy placement by ENT.  History of Present Illness:  28 yo M presenting to [facility] ED from outpatient rehab on 11/19/23 for evaluation of altered mental status.   History obtained per chart review and mother's telephone interview, patient unable to participate in interview due to respiratory distress and baseline TBI. 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 He was treated at [facility] for 5 months then discharged to rehab. Per his mother and legal guardian his baseline is: Non verbal but he will yell out sporadic nonsensical words with left sided paralysis. He is able to move his RUE in order to feed himself with finger foods and he has some involuntary movement with that arm, he will swat at you. Similar baseline function with RLE, he can kick and move, but often will lay it bent and to the side. He has enough strength to even try and get out of bed on the right side. She denies any issues with swallowing, but eats mostly soft food. If he is not watched closely with eating he will try to eat all the food at once. The facility staff reported he was at his normal baseline until lunchtime on 11/19/23. It was observed he was more somnolent and not interactive. Staff noted a cough and slightly increased work of breathing. Mom also confirmed noting a congested cough the last time she visited earlier in the week. Staff and mom denied nausea/ vomiting, but mom  reports chronic loose stools.   EMS reported the patient febrile and tachycardic on arrival.   ED course: Upon arrival patient tachycardic and lethargic. Sepsis protocol initiated with antibiotics and IVF resuscitation. Labs significant for mild hypokalemia, otherwise WNL. Initial imaging unremarkable but then patient became hypoxic with SpO2 85% on RA and a CTa was obtained, negative for PE but concerning for aspiration pneumonia.  Medications given: Cefepime, Flagyl, Vancomycin, 2 L IVF bolus, IV contrast Initial Vitals: 97.9, 17, 130, 119/82 & 92 on RA Significant labs: (Labs/ Imaging personally reviewed) EKG : pending Chemistry: Na+: 138, K+: 3.4, BUN/Cr.: 13/ 0.65, Serum CO2/ AG: 26/8 Hematology: WBC: 4.3, Hgb: 14.3,  Lactic/ PCT: 1.1 > 1.4 / pending, COVID-19 & Influenza A/B: negative   CXR 11/19/23: no active disease CT head wo contrast 11/19/23: No evidence of acute intracranial abnormality. Stable multifocal encephalomalacia, likely the sequela of prior trauma. Similar age-advanced cerebral atrophy CT angio chest PE 11/19/23:  No pulmonary embolus. Near complete filling of the right mainstem bronchus with filling extending into the bronchus intermedius, right upper, middle and lower lobe bronchi. Layering debris in the trachea. Findings are highly suspicious for aspiration. Minimal ill-defined patchy and nodular airspace disease in the dependent right lower lobe, likely postobstructive pneumonia.   Disposition: Admitted to [facility] service. While holding in the ED, respiratory status worsened and due to high risk for decompensation PCCM consulted and patient intubated for airway protection.   Please see "Significant Hospital Events" section below for full detailed hospital course.   Pertinent  Medical History  Seizures TBI (09/2019)  Micro Data:  1/27: SARS-CoV-2/flu/RSV PCR>> negative 1/27: Blood culture x 2>> no growth 1/28: MRSA PCR>>Positive  1/28: Tracheal aspirate>>Pseudomonas  aeruginosa and Staphylococcus aureus 1/28: HIV screen>> nonreactive 1/29: BAL>>Pseudomonas aeruginosa & MRSA 2/07: Cdiff>>negative  2/15: Blood x2>>NGTD  2/16: Tracheal aspirate>>Pseudomonas aeruginosa 2/16: MRSA PCR +  Antimicrobials:   Anti-infectives (From admission, onward)    Start     Dose/Rate Route Frequency Ordered Stop   12/10/23 1100  linezolid (ZYVOX) tablet 600 mg        600 mg Per Tube Every 12 hours 12/10/23 1014     12/08/23 1915  vancomycin (VANCOREADY) IVPB 1250 mg/250 mL  Status:  Discontinued        1,250 mg 166.7 mL/hr over 90 Minutes Intravenous 2 times daily 12/08/23 1821 12/10/23 1013   12/08/23 1915  piperacillin-tazobactam (ZOSYN) IVPB 3.375 g        3.375 g 12.5 mL/hr over 240 Minutes Intravenous Every 8 hours 12/08/23 1821     12/05/23 1443  ceFAZolin (ANCEF) IVPB 1 g/50 mL premix        over 30 Minutes  Continuous PRN 12/05/23 1443 12/05/23 1443   12/05/23 0000  ceFAZolin (ANCEF) IVPB 2g/100 mL premix        2 g 200 mL/hr over 30 Minutes Intravenous To Radiology 12/04/23 1324 12/05/23 0041   12/01/23 2200  vancomycin (VANCOREADY) IVPB 750 mg/150 mL  Status:  Discontinued        750 mg 150 mL/hr over 60 Minutes Intravenous Every 8 hours 12/01/23 1205 12/01/23 1215   12/01/23 1400  vancomycin (VANCOREADY) IVPB 1250 mg/250 mL  Status:  Discontinued        1,250 mg 166.7 mL/hr over 90 Minutes Intravenous  Once 12/01/23 1205 12/01/23 1215   12/01/23 1315  linezolid (ZYVOX) IVPB 600 mg        600 mg 300 mL/hr over 60 Minutes Intravenous Every 12 hours 12/01/23 1215 12/07/23 2253   12/01/23 0230  piperacillin-tazobactam (ZOSYN) IVPB 3.375 g        3.375 g 12.5 mL/hr over 240 Minutes Intravenous Every 8 hours 12/01/23 0131 12/07/23 1944   11/30/23 1500  levofloxacin (LEVAQUIN) IVPB 750 mg  Status:  Discontinued        750 mg 100 mL/hr over 90 Minutes Intravenous Every 24 hours 11/30/23 0345 12/01/23 0048   11/28/23 0100  vancomycin (VANCOREADY) IVPB 1250  mg/250 mL       Placed in "Followed by" Linked Group   1,250 mg 166.7 mL/hr over 90 Minutes Intravenous Every 12 hours 11/27/23 1153 11/29/23 1331   11/27/23 1300  vancomycin (VANCOREADY) IVPB 1500 mg/300 mL       Placed in "Followed by" Linked Group   1,500 mg 150 mL/hr over 120 Minutes Intravenous  Once 11/27/23 1153 11/27/23 1516   11/23/23 1500  levofloxacin (LEVAQUIN) IVPB 750 mg        750 mg 100 mL/hr over 90 Minutes Intravenous Every 24 hours 11/23/23 1341 11/29/23 1551   11/21/23 2200  doxycycline (VIBRAMYCIN) 100 mg in sodium chloride 0.9 % 250 mL IVPB  Status:  Discontinued        100 mg 125 mL/hr over 120 Minutes Intravenous Every 12 hours 11/21/23 1425 11/27/23 1148   11/21/23 2000  metroNIDAZOLE (FLAGYL) IVPB 500 mg  Status:  Discontinued        500 mg 100 mL/hr over 60 Minutes Intravenous Every 12 hours 11/21/23 1425 11/24/23 1227  11/20/23 1200  Ampicillin-Sulbactam (UNASYN) 3 g in sodium chloride 0.9 % 100 mL IVPB  Status:  Discontinued        3 g 200 mL/hr over 30 Minutes Intravenous Every 6 hours 11/20/23 1131 11/21/23 1423   11/20/23 0800  cefTRIAXone (ROCEPHIN) 2 g in sodium chloride 0.9 % 100 mL IVPB  Status:  Discontinued        2 g 200 mL/hr over 30 Minutes Intravenous Every 24 hours 11/19/23 2322 11/20/23 0229   11/20/23 0100  azithromycin (ZITHROMAX) 500 mg in sodium chloride 0.9 % 250 mL IVPB  Status:  Discontinued        500 mg 250 mL/hr over 60 Minutes Intravenous Every 24 hours 11/19/23 2322 11/21/23 1423   11/19/23 1545  ceFEPIme (MAXIPIME) 2 g in sodium chloride 0.9 % 100 mL IVPB        2 g 200 mL/hr over 30 Minutes Intravenous  Once 11/19/23 1541 11/19/23 1630   11/19/23 1545  metroNIDAZOLE (FLAGYL) IVPB 500 mg        500 mg 100 mL/hr over 60 Minutes Intravenous  Once 11/19/23 1541 11/19/23 1748   11/19/23 1545  vancomycin (VANCOCIN) IVPB 1000 mg/200 mL premix        1,000 mg 200 mL/hr over 60 Minutes Intravenous  Once 11/19/23 1541 11/19/23 1917       Significant Hospital Events: Including procedures, antibiotic start and stop dates in addition to other pertinent events   11/20/23: Admit to ICU due to acute hypoxic respiratory failure requiring urgent intubation and mechanical ventilatory support secondary to suspected aspiration and pneumonia 11/21/23- patient moving RUE, mother at bedside we reviewed medical plan. He remains on 59mcg/kg/hr levophed, today plan to rescusitate more aggresively and wean from levophed and potentially extubate post SBP.   He is febrile this am.  He is on zithromax, unasyn 11/22/23- s/p bronch yesterday with aspiration of mucus plugging.  Today resp status improved with liberation protocol in proces. SLP post extubation today. 11/23/23- patient is +for pseudomonas resp cultures.  He is also MRSA pcr +, refined therapy during rounds with pharmacist. He remains on MV 11/24/23- patient is still on vasopressor support weaning down on MV.  Secretions are slightly better.  11/25/23- patient weaned off levophed, failed SBT with tachypnea, tachycardia.  Secretions much improved. 11/26/23- on minimal vent support, unable to perform SBT due to copious secretions from ETT.   11/27/23- on minimal vent support, secretions improved.  Change Doxycycline to Vancomycin.  Plan for SBT as tolerated. 11/28/23-Pt successfully extubated 02/4, currently tolerating HHFNC @35L /35%.  Required low dose precedex overnight to allow suctioning due to excessive secretions. Pt with suspected seizure activity developed tremors in the right upper extremity and became minimally responsive.  Received 1g of iv keppra.    11/29/23-Overnight pt developed sinus tachycardia/svt 140 to 160's improved following 2.5 mg iv metoprolol.  Tolerating RA with no signs of respiratory distress.  Transferring to progressive care unit TRH to pick on 02/7 12/01/23: Pt transferred back to ICU with severe acute hypoxic respiratory failure initially placed on HHFNC but due to significant  hypoxia O2 sats in the 60's he required reintubation and underwent emergent bronchoscopy which revealed thick secretions in the LUL and LLL resulting in mucous plugging. Therapeutic aspiration performed. CXR showed collapse of the LUL secondary to mucus plugging  12/03/23: Pt remains mechanically intubated on minimal vent settings.  ENT consulted for tracheostomy placement due to recurrent respiratory failure due to inability to clear secretions.  Requiring levophed gtt to maintain map 65 or higher  12/04/23: No acute events overnight on minimal settings pending tracheostomy placement  12/05/23: No acute events overnight, on minimal vent support. Initially on low dose Levophed, now weaned off.  IR placed PEG tube. Pending Tracheostomy placement on Friday. 12/06/23: No acute events overnight, on minimal vent support. Awaiting Tracheostomy placement tomorrow. 12/07/23: No acute events overnight.  Tracheostomy placed this morning by ENT, no events with procedure. Will keep sedated today with newly placed Trach with plan for WUA/SBT tomorrow. 12/08/23: All sedation turned off for WUA will perform SBT or TCT as tolerated.  Pt spike at temp 100.9 F, hypotensive requiring levophed gtt, and tachycardic concerning for possible sepsis.  Blood culture/tracheal aspirate and UA sent.  Restarted broad spectrum abx (vancomycin/zosyn) 12/09/23: Pt no longer requiring levophed gtt.  Tmax 102.4 F 12/10/23: Central Line Removed, not requiring pressors or sedation. Tolearting TC. 12/11/23: Overnight was started back on Gabapentin and scheduled Propanol for possible neurostorming with significant improvement in HR from the 170's to mid 100's 12/12/23: HR remains stable in low 100's, not requiring vasopressors, on minimal vent support, SBT as tolerated.  Complete course of ABX as suspect he is colonized with Pseudomonas and MRSA.  Interim History / Subjective:  -No significant hospital events noted overnight -Afebrile,  hemodynamically stable, BP is soft but not requiring vasopressors -On minimal vent settings, trialing in SBT ~ unable to tolerate less than 10/5 as he exhibits increased RR and WOB when pressures lowered below that threshold -Tracheal aspirate again with Pseudomonas ~ suspect he is likely colonized ~ discussed with Dr. Belia Heman, will d/c ABX as he is afebrile and without leukocytosis   Objective   Blood pressure 110/74, pulse (!) 110, temperature 98.8 F (37.1 C), temperature source Axillary, resp. rate (!) 34, height 5\' 9"  (1.753 m), weight 61.5 kg, SpO2 100%.    Vent Mode: PSV;CPAP FiO2 (%):  [28 %] 28 % PEEP:  [5 cmH20] 5 cmH20 Pressure Support:  [5 cmH20-10 cmH20] 10 cmH20 Plateau Pressure:  [13 cmH20-18 cmH20] 18 cmH20   Intake/Output Summary (Last 24 hours) at 12/12/2023 0748 Last data filed at 12/12/2023 0600 Gross per 24 hour  Intake 805.63 ml  Output 850 ml  Net -44.37 ml   Filed Weights   12/10/23 0500 12/11/23 0500 12/12/23 0500  Weight: 63.4 kg 64.1 kg 61.5 kg   Examination: General: Acute on chronically-ill appearing frail male, NAD, receiving mechanical ventilation via Tracheostomy HENT: Atraumatic, normocephalic, neck supple, no JVD, Tracheostomy in place clean dry and intact Lungs: Mechanical breath sound throughout, even, non labored, synchronous with the vent Cardiovascular: Sinus tachycardia, s1s2, no m/r/g, 2+ radial/2+ distal pulses, trace generalized edema  Abdomen: +BS x4, soft, non distended  Extremities: Left upper and lower extremities contractured, bilateral foot drop Neuro: Awake, Left-sided hemiparesis with left arm contractured (baseline), opens eyes to voice and tracks, moves right extremities purposefully but not to command GU: External male catheter in place  Resolved Hospital Problem list     Assessment & Plan:   #Sinus tachycardia/SVT  #Hypotension secondary to sedating medication and possible recurrent sepsis  - Continuous telemetry  monitoring - Continue scheduled propanolol for possible neurostorming with significant Improvement - IV fluid resuscitation prn and/or vasopressors to maintain map 65 or higher  - Lactic acid is normalized - HS Troponin negative x2  #Acute hypoxic respiratory failure secondary to aspiration, LUL/LLL mucous plugging, and collapse of the LUL due to mucous plugging in the setting of  TBI s/p emergent bronchoscopy 02/8 #Mechanical ventilation  Status post Tracheostomy placement with ENT on 12/06/22 -Full vent support, implement lung protective strategies -Plateau pressures less than 30 cm H20 -Wean FiO2 & PEEP as tolerated to maintain O2 sats >92% -Follow intermittent Chest X-ray & ABG as needed -Spontaneous Breathing Trials / Trach collar trials when respiratory parameters met and mental status permits -Implement VAP Bundle -Prn Bronchodilators -ABX as above  #Pseudomonas & MRSA pneumonia~ TREATED, suspect he is colonized #Aspiration pneumonia~treated   #Possible recurrent sepsis  - Trend WBC and monitor fever curve  - Trend PCT  - Follow cultures  - Complete course of Linezolid and zosyn 2/19   #Chronic Seizure Disorder #Chronic TBI #Sedation needs in setting of mechanical ventilation EEG 02/10: obtained while sedated on propofol and comatose and is abnormal due to severe diffuse slowing indicative of global cerebral dysfunction, medication effect, or both. Epileptiform abnormalities were not seen during this recording.  - Maintain a RASS goal of 0  - scheduled oxycodone per tube 02/15  - Avoid sedating medications as able - Daily wake up assessment - Continue outpatient vimpat and valproic acid  - Continue outpatient baclofen , seroquel and Gabapentin - Seizure precautions   #Dysphagia - TF's via G-tube    Best Practice (right click and "Reselect all SmartList Selections" daily)   Diet/type: TF's  DVT prophylaxis: resume LMWH  GI prophylaxis: PPI Lines: N/A Foley:   N/A Code Status:  full code Last date of multidisciplinary goals of care discussion [12/12/23]  2/19: Will update pt's mother when she arrives at bedside.  Labs   CBC: Recent Labs  Lab 12/06/23 0352 12/07/23 0334 12/08/23 0530 12/09/23 0410 12/10/23 0515 12/11/23 0335  WBC 4.3 4.7 4.6 7.7 7.2 4.7  NEUTROABS 2.3  --   --  5.6 4.7  --   HGB 9.2* 10.1* 9.6* 9.9* 10.0* 10.2*  HCT 28.4* 31.1* 30.4* 30.8* 31.1* 30.6*  MCV 96.3 96.6 97.4 96.6 96.9 94.4  PLT 353 349 367 367 272 344   Basic Metabolic Panel: Recent Labs  Lab 12/07/23 0334 12/08/23 0530 12/08/23 1757 12/09/23 0410 12/10/23 0500 12/10/23 0515 12/11/23 0335  NA 141 141  --  139 139  --  138  K 3.9 3.2* 4.2 4.2 3.4*  --  3.6  CL 105 108  --  109 107  --  104  CO2 28 27  --  21* 25  --  23  GLUCOSE 88 136*  --  97 102*  --  139*  BUN 17 16  --  15 13  --  16  CREATININE 0.50* 0.52*  --  0.47* 0.53*  --  0.47*  CALCIUM 8.6* 8.5*  --  8.5* 8.2*  --  8.7*  MG 2.0 2.0 2.0 1.7  --  2.1 2.1  PHOS 4.1  --  3.7 3.1 3.9  --  3.3   GFR: Estimated Creatinine Clearance: 119.6 mL/min (A) (by C-G formula based on SCr of 0.47 mg/dL (L)). Recent Labs  Lab 12/08/23 0530 12/08/23 1756 12/08/23 1757 12/09/23 0410 12/10/23 0515 12/11/23 0335  PROCALCITON  --   --  <0.10  --   --   --   WBC 4.6  --   --  7.7 7.2 4.7  LATICACIDVEN  --  1.4  --   --   --   --    Liver Function Tests: Recent Labs  Lab 12/07/23 0334 12/09/23 0410 12/11/23 0335  AST  --   --  12*  ALT  --   --  8  ALKPHOS  --   --  67  BILITOT  --   --  0.5  PROT  --   --  6.3*  ALBUMIN 2.2* 2.2* 2.3*   No results for input(s): "LIPASE", "AMYLASE" in the last 168 hours. No results for input(s): "AMMONIA" in the last 168 hours.  ABG    Component Value Date/Time   PHART 7.44 12/01/2023 1143   PCO2ART 34 12/01/2023 1143   PO2ART 63 (L) 12/01/2023 1143   HCO3 23.1 12/01/2023 1143   TCO2 28 05/31/2021 0404   ACIDBASEDEF 0.5 12/01/2023 1143   O2SAT  93 12/01/2023 1143   Coagulation Profile: Recent Labs  Lab 12/07/23 0334  INR 1.0   Cardiac Enzymes: No results for input(s): "CKTOTAL", "CKMB", "CKMBINDEX", "TROPONINI" in the last 168 hours.  HbA1C: Hgb A1c MFr Bld  Date/Time Value Ref Range Status  04/11/2022 06:10 AM 5.1 4.8 - 5.6 % Final    Comment:    (NOTE) Pre diabetes:          5.7%-6.4%  Diabetes:              >6.4%  Glycemic control for   <7.0% adults with diabetes   05/31/2021 04:18 PM 4.9 4.8 - 5.6 % Final    Comment:    (NOTE) Pre diabetes:          5.7%-6.4%  Diabetes:              >6.4%  Glycemic control for   <7.0% adults with diabetes    CBG: Recent Labs  Lab 12/11/23 1533 12/11/23 1953 12/11/23 2344 12/12/23 0013 12/12/23 0412  GLUCAP 94 108* 96 98 106*    Review of Systems:   Unable to assess pt nonverbal and Tracheostomy in place  Past Medical History:  He,  has a past medical history of 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   Surgical History:   Past Surgical History:  Procedure Laterality Date   GASTROSTOMY TUBE PLACEMENT     IR GASTROSTOMY TUBE MOD SED  12/05/2023   TRACHEOSTOMY TUBE PLACEMENT N/A 12/07/2023   Procedure: TRACHEOSTOMY;  Surgeon: Lanell Persons, MD;  Location: [facility] ORS;  Service: ENT;  Laterality: N/A;     Social History:   reports that he has never smoked. He has never used smokeless tobacco. He reports that he does not currently use alcohol. He reports that he does not use drugs.   Family History:  His family history is not on file.   Allergies No Known Allergies   Home Medications  Prior to Admission medications   Medication Sig Start Date End Date Taking? Authorizing Provider  baclofen (LIORESAL) 10 MG tablet Take 10 mg by mouth 3 (three) times daily.   Yes [provider]  diazePAM, 15 MG Dose, (VALTOCO 15 MG DOSE) 2 x 7.5 MG/0.1ML LQPK Spray 7.5  mg into each nostril in the event of a seizure 04/20/22  Yes Zigmund Daniel., MD  gabapentin (NEURONTIN) 400 MG capsule Take 1 capsule (400 mg total) by mouth 3 (three) times daily. 04/20/22 11/20/23 Yes Zigmund Daniel., MD  lacosamide 100 MG TABS Take 1 tablet (100 mg total) by mouth 2 (two) times daily. 04/20/22 11/20/23 Yes Zigmund Daniel., MD  melatonin 5 MG TABS Take 10 mg by mouth at bedtime.   Yes [provider]  QUEtiapine (SEROQUEL XR) 200 MG 24 hr tablet Take 200 mg by mouth in the morning.   Yes [provider]  QUEtiapine (SEROQUEL) 25 MG tablet Take 25 mg by mouth at bedtime.   Yes [provider]  valproic acid (DEPAKENE) 250 MG capsule Take 4 capsules (1,000 mg total) by mouth 2 (two) times daily. Patient taking differently: Take 500 mg by mouth every morning. 04/20/22 11/20/23 Yes Zigmund Daniel., MD  valproic acid (DEPAKENE) 250 MG capsule Take 750 mg by mouth every evening.   Yes [provider]   Scheduled Meds:  baclofen  10 mg Per Tube TID   Chlorhexidine Gluconate Cloth  6 each Topical PC lunch   enoxaparin (LOVENOX) injection  40 mg Subcutaneous Q24H   feeding supplement (PROSource TF20)  60 mL Per Tube Daily   free water  30 mL Per Tube Q4H   gabapentin  300 mg Per Tube Q8H   lacosamide  100 mg Per Tube BID   linezolid  600 mg Per Tube Q12H   mupirocin ointment  1 Application Nasal BID   nutrition supplement (JUVEN)  1 packet Per Tube BID BM   mouth rinse  15 mL Mouth Rinse Q2H   oxyCODONE  5 mg Per Tube Q6H   pantoprazole (PROTONIX) IV  40 mg Intravenous Q12H   propranolol  20 mg Per Tube Q8H   QUEtiapine  50 mg Per Tube TID   senna  1 tablet Per Tube Daily   sodium chloride flush  10-40 mL Intracatheter Q12H   valproic acid  500 mg Per Tube Daily   valproic acid  750 mg Per Tube QHS   Continuous Infusions:  feeding supplement (OSMOLITE 1.5 CAL) 50 mL/hr at 12/12/23 0600   piperacillin-tazobactam  (ZOSYN)  IV 12.5 mL/hr at 12/12/23 0600   PRN Meds:.acetaminophen **OR** acetaminophen, bisacodyl, glycopyrrolate, levalbuterol, lip balm, ondansetron **OR** ondansetron (ZOFRAN) IV, mouth rinse, polyethylene glycol, sodium chloride flush   Critical care time: 40 minutes    Harlon Ditty, AGACNP-BC [facility] Prefer epic messenger for cross cover needs If after hours, please call E-link
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
## 2023-12-13 ENCOUNTER — Inpatient Hospital Stay: Payer: Medicaid Other

## 2023-12-13 DIAGNOSIS — I952 Hypotension due to drugs: Secondary | ICD-10-CM | POA: Diagnosis not present

## 2023-12-13 DIAGNOSIS — J9601 Acute respiratory failure with hypoxia: Secondary | ICD-10-CM | POA: Diagnosis not present

## 2023-12-13 DIAGNOSIS — J690 Pneumonitis due to inhalation of food and vomit: Secondary | ICD-10-CM | POA: Diagnosis not present

## 2023-12-13 DIAGNOSIS — G9341 Metabolic encephalopathy: Secondary | ICD-10-CM | POA: Diagnosis not present

## 2023-12-13 LAB — GLUCOSE, CAPILLARY
Glucose-Capillary: 102 mg/dL — ABNORMAL HIGH (ref 70–99)
Glucose-Capillary: 104 mg/dL — ABNORMAL HIGH (ref 70–99)
Glucose-Capillary: 114 mg/dL — ABNORMAL HIGH (ref 70–99)
Glucose-Capillary: 81 mg/dL (ref 70–99)
Glucose-Capillary: 84 mg/dL (ref 70–99)
Glucose-Capillary: 84 mg/dL (ref 70–99)
Glucose-Capillary: 88 mg/dL (ref 70–99)

## 2023-12-13 LAB — CULTURE, BLOOD (ROUTINE X 2)
Culture: NO GROWTH
Culture: NO GROWTH
Special Requests: ADEQUATE

## 2023-12-13 LAB — CBC
HCT: 31.6 % — ABNORMAL LOW (ref 39.0–52.0)
Hemoglobin: 10.1 g/dL — ABNORMAL LOW (ref 13.0–17.0)
MCH: 31.1 pg (ref 26.0–34.0)
MCHC: 32 g/dL (ref 30.0–36.0)
MCV: 97.2 fL (ref 80.0–100.0)
Platelets: 306 10*3/uL (ref 150–400)
RBC: 3.25 MIL/uL — ABNORMAL LOW (ref 4.22–5.81)
RDW: 13.6 % (ref 11.5–15.5)
WBC: 5 10*3/uL (ref 4.0–10.5)
nRBC: 0 % (ref 0.0–0.2)

## 2023-12-13 LAB — RENAL FUNCTION PANEL
Albumin: 2.8 g/dL — ABNORMAL LOW (ref 3.5–5.0)
Anion gap: 8 (ref 5–15)
BUN: 11 mg/dL (ref 6–20)
CO2: 25 mmol/L (ref 22–32)
Calcium: 8.7 mg/dL — ABNORMAL LOW (ref 8.9–10.3)
Chloride: 105 mmol/L (ref 98–111)
Creatinine, Ser: 0.48 mg/dL — ABNORMAL LOW (ref 0.61–1.24)
GFR, Estimated: >60 mL/min (ref >60)
Glucose, Bld: 113 mg/dL — ABNORMAL HIGH (ref 70–99)
Phosphorus: 3 mg/dL (ref 2.5–4.6)
Potassium: 4.2 mmol/L (ref 3.5–5.1)
Sodium: 138 mmol/L (ref 135–145)

## 2023-12-13 LAB — MAGNESIUM: Magnesium: 2 mg/dL (ref 1.7–2.4)

## 2023-12-13 MED ORDER — PROPRANOLOL HCL 20 MG PO TABS
10.0000 mg | ORAL_TABLET | Freq: Three times a day (TID) | ORAL | Status: DC
  Administered 2023-12-13 – 2023-12-14 (×5): 10 mg
  Filled 2023-12-13 (×6): qty 1

## 2023-12-13 MED ORDER — SENNA 8.6 MG PO TABS: 1.0000 | ORAL_TABLET | Freq: Every day | ORAL | Status: DC | PRN

## 2023-12-13 NOTE — Progress Notes (Signed)
 NAME:  Alex [name], MRN:  [id number], DOB:  1996/05/22, LOS: 24 ADMISSION DATE:  11/19/2023, CONSULTATION DATE:  11/20/23 REFERRING MD:  Dr. Arville Care, CHIEF COMPLAINT:  AMS   Brief Pt Description / Synopsis:  28 y.o. male with PMHx significant for chronic TBI with left sided hemiparesis admitted with Acute Metabolic Encephalopathy and Acute Hypoxic Respiratory Failure in the setting of aspiration and Pseudomonas and MRSA Pneumonia requiring intubation and mechanical ventilation. With recurrent aspiration events, required Tracheostomy placement by ENT.  History of Present Illness:  28 yo M presenting to [facility] ED from outpatient rehab on 11/19/23 for evaluation of altered mental status.   History obtained per chart review and mother's telephone interview, patient unable to participate in interview due to respiratory distress and baseline TBI. 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 He was treated at [facility] for 5 months then discharged to rehab. Per his mother and legal guardian his baseline is: Non verbal but he will yell out sporadic nonsensical words with left sided paralysis. He is able to move his RUE in order to feed himself with finger foods and he has some involuntary movement with that arm, he will swat at you. Similar baseline function with RLE, he can kick and move, but often will lay it bent and to the side. He has enough strength to even try and get out of bed on the right side. She denies any issues with swallowing, but eats mostly soft food. If he is not watched closely with eating he will try to eat all the food at once. The facility staff reported he was at his normal baseline until lunchtime on 11/19/23. It was observed he was more somnolent and not interactive. Staff noted a cough and slightly increased work of breathing. Mom also confirmed noting a congested cough the last time she visited earlier in the week. Staff and mom denied nausea/ vomiting, but mom  reports chronic loose stools.   EMS reported the patient febrile and tachycardic on arrival.   ED course: Upon arrival patient tachycardic and lethargic. Sepsis protocol initiated with antibiotics and IVF resuscitation. Labs significant for mild hypokalemia, otherwise WNL. Initial imaging unremarkable but then patient became hypoxic with SpO2 85% on RA and a CTa was obtained, negative for PE but concerning for aspiration pneumonia.  Medications given: Cefepime, Flagyl, Vancomycin, 2 L IVF bolus, IV contrast Initial Vitals: 97.9, 17, 130, 119/82 & 92 on RA Significant labs: (Labs/ Imaging personally reviewed) EKG : pending Chemistry: Na+: 138, K+: 3.4, BUN/Cr.: 13/ 0.65, Serum CO2/ AG: 26/8 Hematology: WBC: 4.3, Hgb: 14.3,  Lactic/ PCT: 1.1 > 1.4 / pending, COVID-19 & Influenza A/B: negative   CXR 11/19/23: no active disease CT head wo contrast 11/19/23: No evidence of acute intracranial abnormality. Stable multifocal encephalomalacia, likely the sequela of prior trauma. Similar age-advanced cerebral atrophy CT angio chest PE 11/19/23:  No pulmonary embolus. Near complete filling of the right mainstem bronchus with filling extending into the bronchus intermedius, right upper, middle and lower lobe bronchi. Layering debris in the trachea. Findings are highly suspicious for aspiration. Minimal ill-defined patchy and nodular airspace disease in the dependent right lower lobe, likely postobstructive pneumonia.   Disposition: Admitted to [facility] service. While holding in the ED, respiratory status worsened and due to high risk for decompensation PCCM consulted and patient intubated for airway protection.   Please see "Significant Hospital Events" section below for full detailed hospital course.   Pertinent  Medical History  Seizures TBI (09/2019)  Micro Data:  1/27: SARS-CoV-2/flu/RSV PCR>> negative 1/27: Blood culture x 2>> no growth 1/28: MRSA PCR>>Positive  1/28: Tracheal aspirate>>Pseudomonas  aeruginosa and Staphylococcus aureus 1/28: HIV screen>> nonreactive 1/29: BAL>>Pseudomonas aeruginosa & MRSA 2/07: Cdiff>>negative  2/15: Blood x2>>NGTD  2/16: Tracheal aspirate>>Pseudomonas aeruginosa 2/16: MRSA PCR +  Antimicrobials:   Anti-infectives (From admission, onward)    Start     Dose/Rate Route Frequency Ordered Stop   12/10/23 1100  linezolid (ZYVOX) tablet 600 mg  Status:  Discontinued        600 mg Per Tube Every 12 hours 12/10/23 1014 12/12/23 1126   12/08/23 1915  vancomycin (VANCOREADY) IVPB 1250 mg/250 mL  Status:  Discontinued        1,250 mg 166.7 mL/hr over 90 Minutes Intravenous 2 times daily 12/08/23 1821 12/10/23 1013   12/08/23 1915  piperacillin-tazobactam (ZOSYN) IVPB 3.375 g  Status:  Discontinued        3.375 g 12.5 mL/hr over 240 Minutes Intravenous Every 8 hours 12/08/23 1821 12/12/23 1105   12/05/23 1443  ceFAZolin (ANCEF) IVPB 1 g/50 mL premix        over 30 Minutes  Continuous PRN 12/05/23 1443 12/05/23 1443   12/05/23 0000  ceFAZolin (ANCEF) IVPB 2g/100 mL premix        2 g 200 mL/hr over 30 Minutes Intravenous To Radiology 12/04/23 1324 12/05/23 0041   12/01/23 2200  vancomycin (VANCOREADY) IVPB 750 mg/150 mL  Status:  Discontinued        750 mg 150 mL/hr over 60 Minutes Intravenous Every 8 hours 12/01/23 1205 12/01/23 1215   12/01/23 1400  vancomycin (VANCOREADY) IVPB 1250 mg/250 mL  Status:  Discontinued        1,250 mg 166.7 mL/hr over 90 Minutes Intravenous  Once 12/01/23 1205 12/01/23 1215   12/01/23 1315  linezolid (ZYVOX) IVPB 600 mg        600 mg 300 mL/hr over 60 Minutes Intravenous Every 12 hours 12/01/23 1215 12/07/23 2253   12/01/23 0230  piperacillin-tazobactam (ZOSYN) IVPB 3.375 g        3.375 g 12.5 mL/hr over 240 Minutes Intravenous Every 8 hours 12/01/23 0131 12/07/23 1944   11/30/23 1500  levofloxacin (LEVAQUIN) IVPB 750 mg  Status:  Discontinued        750 mg 100 mL/hr over 90 Minutes Intravenous Every 24 hours 11/30/23  0345 12/01/23 0048   11/28/23 0100  vancomycin (VANCOREADY) IVPB 1250 mg/250 mL       Placed in "Followed by" Linked Group   1,250 mg 166.7 mL/hr over 90 Minutes Intravenous Every 12 hours 11/27/23 1153 11/29/23 1331   11/27/23 1300  vancomycin (VANCOREADY) IVPB 1500 mg/300 mL       Placed in "Followed by" Linked Group   1,500 mg 150 mL/hr over 120 Minutes Intravenous  Once 11/27/23 1153 11/27/23 1516   11/23/23 1500  levofloxacin (LEVAQUIN) IVPB 750 mg        750 mg 100 mL/hr over 90 Minutes Intravenous Every 24 hours 11/23/23 1341 11/29/23 1551   11/21/23 2200  doxycycline (VIBRAMYCIN) 100 mg in sodium chloride 0.9 % 250 mL IVPB  Status:  Discontinued        100 mg 125 mL/hr over 120 Minutes Intravenous Every 12 hours 11/21/23 1425 11/27/23 1148   11/21/23 2000  metroNIDAZOLE (FLAGYL) IVPB 500 mg  Status:  Discontinued        500 mg 100 mL/hr over 60 Minutes  Intravenous Every 12 hours 11/21/23 1425 11/24/23 1227   11/20/23 1200  Ampicillin-Sulbactam (UNASYN) 3 g in sodium chloride 0.9 % 100 mL IVPB  Status:  Discontinued        3 g 200 mL/hr over 30 Minutes Intravenous Every 6 hours 11/20/23 1131 11/21/23 1423   11/20/23 0800  cefTRIAXone (ROCEPHIN) 2 g in sodium chloride 0.9 % 100 mL IVPB  Status:  Discontinued        2 g 200 mL/hr over 30 Minutes Intravenous Every 24 hours 11/19/23 2322 11/20/23 0229   11/20/23 0100  azithromycin (ZITHROMAX) 500 mg in sodium chloride 0.9 % 250 mL IVPB  Status:  Discontinued        500 mg 250 mL/hr over 60 Minutes Intravenous Every 24 hours 11/19/23 2322 11/21/23 1423   11/19/23 1545  ceFEPIme (MAXIPIME) 2 g in sodium chloride 0.9 % 100 mL IVPB        2 g 200 mL/hr over 30 Minutes Intravenous  Once 11/19/23 1541 11/19/23 1630   11/19/23 1545  metroNIDAZOLE (FLAGYL) IVPB 500 mg        500 mg 100 mL/hr over 60 Minutes Intravenous  Once 11/19/23 1541 11/19/23 1748   11/19/23 1545  vancomycin (VANCOCIN) IVPB 1000 mg/200 mL premix        1,000 mg 200  mL/hr over 60 Minutes Intravenous  Once 11/19/23 1541 11/19/23 1917      Significant Hospital Events: Including procedures, antibiotic start and stop dates in addition to other pertinent events   11/20/23: Admit to ICU due to acute hypoxic respiratory failure requiring urgent intubation and mechanical ventilatory support secondary to suspected aspiration and pneumonia 11/21/23- patient moving RUE, mother at bedside we reviewed medical plan. He remains on 2mcg/kg/hr levophed, today plan to rescusitate more aggresively and wean from levophed and potentially extubate post SBP.   He is febrile this am.  He is on zithromax, unasyn 11/22/23- s/p bronch yesterday with aspiration of mucus plugging.  Today resp status improved with liberation protocol in proces. SLP post extubation today. 11/23/23- patient is +for pseudomonas resp cultures.  He is also MRSA pcr +, refined therapy during rounds with pharmacist. He remains on MV 11/24/23- patient is still on vasopressor support weaning down on MV.  Secretions are slightly better.  11/25/23- patient weaned off levophed, failed SBT with tachypnea, tachycardia.  Secretions much improved. 11/26/23- on minimal vent support, unable to perform SBT due to copious secretions from ETT.   11/27/23- on minimal vent support, secretions improved.  Change Doxycycline to Vancomycin.  Plan for SBT as tolerated. 11/28/23-Pt successfully extubated 02/4, currently tolerating HHFNC @35L /35%.  Required low dose precedex overnight to allow suctioning due to excessive secretions. Pt with suspected seizure activity developed tremors in the right upper extremity and became minimally responsive.  Received 1g of iv keppra.    11/29/23-Overnight pt developed sinus tachycardia/svt 140 to 160's improved following 2.5 mg iv metoprolol.  Tolerating RA with no signs of respiratory distress.  Transferring to progressive care unit TRH to pick on 02/7 12/01/23: Pt transferred back to ICU with severe acute hypoxic  respiratory failure initially placed on HHFNC but due to significant hypoxia O2 sats in the 60's he required reintubation and underwent emergent bronchoscopy which revealed thick secretions in the LUL and LLL resulting in mucous plugging. Therapeutic aspiration performed. CXR showed collapse of the LUL secondary to mucus plugging  12/03/23: Pt remains mechanically intubated on minimal vent settings.  ENT consulted for tracheostomy placement due  to recurrent respiratory failure due to inability to clear secretions.  Requiring levophed gtt to maintain map 65 or higher  12/04/23: No acute events overnight on minimal settings pending tracheostomy placement  12/05/23: No acute events overnight, on minimal vent support. Initially on low dose Levophed, now weaned off.  IR placed PEG tube. Pending Tracheostomy placement on Friday. 12/06/23: No acute events overnight, on minimal vent support. Awaiting Tracheostomy placement tomorrow. 12/07/23: No acute events overnight.  Tracheostomy placed this morning by ENT, no events with procedure. Will keep sedated today with newly placed Trach with plan for WUA/SBT tomorrow. 12/08/23: All sedation turned off for WUA will perform SBT or TCT as tolerated.  Pt spike at temp 100.9 F, hypotensive requiring levophed gtt, and tachycardic concerning for possible sepsis.  Blood culture/tracheal aspirate and UA sent.  Restarted broad spectrum abx (vancomycin/zosyn) 12/09/23: Pt no longer requiring levophed gtt.  Tmax 102.4 F 12/10/23: Central Line Removed, not requiring pressors or sedation. Tolearting TC. 12/11/23: Overnight was started back on Gabapentin and scheduled Propanol for possible neurostorming with significant improvement in HR from the 170's to mid 100's 12/12/23: HR remains stable in low 100's, not requiring vasopressors, on minimal vent support, SBT as tolerated.  Complete course of ABX as suspect he is colonized with Pseudomonas and MRSA. 12/13/23: Overnight with concern for  possible tube feeds in ETT tubing. Propanol was decreased due to soft BP. CXR this morning without new infiltrate, no leukocytosis or fever, and bowel pattern normal on KUB, will restart tube feeds. Remains on minimal vent support, execise in PSV as tolerated.  Interim History / Subjective:  As outlined above under Significant Hospital Events section above  Objective   Blood pressure 108/80, pulse (!) 112, temperature 98.6 F (37 C), temperature source Axillary, resp. rate 20, height 5' 9.02" (1.753 m), weight 62.8 kg, SpO2 100%.    Vent Mode: PSV FiO2 (%):  [28 %] 28 % PEEP:  [5 cmH20] 5 cmH20 Pressure Support:  [8 cmH20-10 cmH20] 10 cmH20   Intake/Output Summary (Last 24 hours) at 12/13/2023 0759 Last data filed at 12/13/2023 0700 Gross per 24 hour  Intake 1200 ml  Output 1050 ml  Net 150 ml   Filed Weights   12/11/23 0500 12/12/23 0500 12/13/23 0500  Weight: 64.1 kg 61.5 kg 62.8 kg   Examination: General: Acute on chronically-ill appearing frail male, NAD, receiving mechanical ventilation via Tracheostomy HENT: Atraumatic, normocephalic, neck supple, no JVD, Tracheostomy in place with thick whitish secretions around trach Lungs: Mechanical breath sound throughout, even, non labored, synchronous with the vent Cardiovascular: Sinus tachycardia, s1s2, no m/r/g, 2+ radial/2+ distal pulses, trace generalized edema  Abdomen: +BS x4, soft, non distended  Extremities: Left upper and lower extremities contractured, bilateral foot drop Neuro: Awake, Left-sided hemiparesis with left arm contractured (baseline), opens eyes to voice and tracks, moves right extremities purposefully but not to command GU: External male catheter in place  Resolved Hospital Problem list     Assessment & Plan:   #Sinus tachycardia/SVT  #Hypotension secondary to sedating medication and possible recurrent sepsis  - Continuous telemetry monitoring - Decrease scheduled propanolol for possible neurostorming  given soft BP - IV fluid resuscitation prn and/or vasopressors to maintain map 65 or higher -Continue Midodrine  - Lactic acid is normalized - HS Troponin negative x2  #Acute hypoxic respiratory failure secondary to aspiration, LUL/LLL mucous plugging, and collapse of the LUL due to mucous plugging in the setting of TBI s/p emergent bronchoscopy 02/8 #Mechanical ventilation  Status post Tracheostomy placement with ENT on 12/06/22 -Full vent support, implement lung protective strategies -Plateau pressures less than 30 cm H20 -Wean FiO2 & PEEP as tolerated to maintain O2 sats >92% -Follow intermittent Chest X-ray & ABG as needed -Spontaneous Breathing Trials / Trach collar trials when respiratory parameters met and mental status permits -Implement VAP Bundle -Prn Bronchodilators -Completed ABX as above  #Pseudomonas & MRSA pneumonia~ TREATED, SUSPECT HE IS COLONIZED #Aspiration pneumonia~ TREATED #Possible recurrent sepsis  - Trend WBC and monitor fever curve  - Follow cultures  - Complete course of Linezolid and zosyn 2/19   #Chronic Seizure Disorder #Chronic TBI #Sedation needs in setting of mechanical ventilation EEG 02/10: obtained while sedated on propofol and comatose and is abnormal due to severe diffuse slowing indicative of global cerebral dysfunction, medication effect, or both. Epileptiform abnormalities were not seen during this recording.  - Maintain a RASS goal of 0  - scheduled oxycodone per tube 02/15  - Avoid sedating medications as able - Daily wake up assessment - Continue outpatient vimpat and valproic acid  - Continue outpatient baclofen , seroquel and Gabapentin - Seizure precautions   #Dysphagia - TF's via G-tube    Best Practice (right click and "Reselect all SmartList Selections" daily)   Diet/type: TF's  DVT prophylaxis: resume LMWH  GI prophylaxis: PPI Lines: N/A Foley:  N/A Code Status:  full code Last date of multidisciplinary goals of care  discussion [12/13/23]  2/20: Will update pt's mother when she arrives at bedside.  Labs   CBC: Recent Labs  Lab 12/09/23 0410 12/10/23 0515 12/11/23 0335 12/12/23 0839 12/13/23 0419  WBC 7.7 7.2 4.7 6.4 5.0  NEUTROABS 5.6 4.7  --   --   --   HGB 9.9* 10.0* 10.2* 11.3* 10.1*  HCT 30.8* 31.1* 30.6* 34.8* 31.6*  MCV 96.6 96.9 94.4 96.7 97.2  PLT 367 272 344 360 306   Basic Metabolic Panel: Recent Labs  Lab 12/09/23 0410 12/10/23 0500 12/10/23 0515 12/11/23 0335 12/12/23 0839 12/13/23 0419  NA 139 139  --  138 139 138  K 4.2 3.4*  --  3.6 4.9 4.2  CL 109 107  --  104 106 105  CO2 21* 25  --  23 23 25   GLUCOSE 97 102*  --  139* 94 113*  BUN 15 13  --  16 16 11   CREATININE 0.47* 0.53*  --  0.47* 0.54* 0.48*  CALCIUM 8.5* 8.2*  --  8.7* 8.9 8.7*  MG 1.7  --  2.1 2.1 2.2 2.0  PHOS 3.1 3.9  --  3.3 3.0 3.0   GFR: Estimated Creatinine Clearance: 122.1 mL/min (A) (by C-G formula based on SCr of 0.48 mg/dL (L)). Recent Labs  Lab 12/08/23 1756 12/08/23 1757 12/09/23 0410 12/10/23 0515 12/11/23 0335 12/12/23 0839 12/13/23 0419  PROCALCITON  --  <0.10  --   --   --   --   --   WBC  --   --    < > 7.2 4.7 6.4 5.0  LATICACIDVEN 1.4  --   --   --   --   --   --    < > = values in this interval not displayed.   Liver Function Tests: Recent Labs  Lab 12/07/23 0334 12/09/23 0410 12/11/23 0335 12/12/23 0839 12/13/23 0419  AST  --   --  12*  --   --   ALT  --   --  8  --   --  ALKPHOS  --   --  67  --   --   BILITOT  --   --  0.5  --   --   PROT  --   --  6.3*  --   --   ALBUMIN 2.2* 2.2* 2.3* 2.4* 2.8*   No results for input(s): "LIPASE", "AMYLASE" in the last 168 hours. No results for input(s): "AMMONIA" in the last 168 hours.  ABG    Component Value Date/Time   PHART 7.44 12/01/2023 1143   PCO2ART 34 12/01/2023 1143   PO2ART 63 (L) 12/01/2023 1143   HCO3 23.1 12/01/2023 1143   TCO2 28 05/31/2021 0404   ACIDBASEDEF 0.5 12/01/2023 1143   O2SAT 93  12/01/2023 1143   Coagulation Profile: Recent Labs  Lab 12/07/23 0334  INR 1.0   Cardiac Enzymes: No results for input(s): "CKTOTAL", "CKMB", "CKMBINDEX", "TROPONINI" in the last 168 hours.  HbA1C: Hgb A1c MFr Bld  Date/Time Value Ref Range Status  04/11/2022 06:10 AM 5.1 4.8 - 5.6 % Final    Comment:    (NOTE) Pre diabetes:          5.7%-6.4%  Diabetes:              >6.4%  Glycemic control for   <7.0% adults with diabetes   05/31/2021 04:18 PM 4.9 4.8 - 5.6 % Final    Comment:    (NOTE) Pre diabetes:          5.7%-6.4%  Diabetes:              >6.4%  Glycemic control for   <7.0% adults with diabetes    CBG: Recent Labs  Lab 12/12/23 1234 12/12/23 1941 12/12/23 2346 12/13/23 0417 12/13/23 0731  GLUCAP 131* 110* 104* 114* 84    Review of Systems:   Unable to assess pt nonverbal and Tracheostomy in place  Past Medical History:  He,  has a past medical history of 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   Surgical History:   Past Surgical History:  Procedure Laterality Date   GASTROSTOMY TUBE PLACEMENT     IR GASTROSTOMY TUBE MOD SED  12/05/2023   TRACHEOSTOMY TUBE PLACEMENT N/A 12/07/2023   Procedure: TRACHEOSTOMY;  Surgeon: Lanell Persons, MD;  Location: [facility] ORS;  Service: ENT;  Laterality: N/A;     Social History:   reports that he has never smoked. He has never used smokeless tobacco. He reports that he does not currently use alcohol. He reports that he does not use drugs.   Family History:  His family history is not on file.   Allergies No Known Allergies   Home Medications  Prior to Admission medications   Medication Sig Start Date End Date Taking? Authorizing Provider  baclofen (LIORESAL) 10 MG tablet Take 10 mg by mouth 3 (three) times daily.   Yes [provider]  diazePAM, 15 MG Dose, (VALTOCO 15 MG DOSE) 2 x 7.5 MG/0.1ML LQPK Spray 7.5  mg into each nostril in the event of a seizure 04/20/22  Yes Zigmund Daniel., MD  gabapentin (NEURONTIN) 400 MG capsule Take 1 capsule (400 mg total) by mouth 3 (three) times daily. 04/20/22 11/20/23 Yes Zigmund Daniel., MD  lacosamide 100 MG TABS Take 1 tablet (100 mg total) by mouth 2 (two) times daily. 04/20/22 11/20/23 Yes Zigmund Daniel., MD  melatonin 5 MG TABS Take 10 mg by  mouth at bedtime.   Yes [provider]  QUEtiapine (SEROQUEL XR) 200 MG 24 hr tablet Take 200 mg by mouth in the morning.   Yes [provider]  QUEtiapine (SEROQUEL) 25 MG tablet Take 25 mg by mouth at bedtime.   Yes [provider]  valproic acid (DEPAKENE) 250 MG capsule Take 4 capsules (1,000 mg total) by mouth 2 (two) times daily. Patient taking differently: Take 500 mg by mouth every morning. 04/20/22 11/20/23 Yes Zigmund Daniel., MD  valproic acid (DEPAKENE) 250 MG capsule Take 750 mg by mouth every evening.   Yes [provider]   Scheduled Meds:  baclofen  10 mg Per Tube TID   Chlorhexidine Gluconate Cloth  6 each Topical PC lunch   enoxaparin (LOVENOX) injection  40 mg Subcutaneous Q24H   feeding supplement (PROSource TF20)  60 mL Per Tube Daily   free water  30 mL Per Tube Q4H   gabapentin  300 mg Per Tube Q8H   lacosamide  100 mg Per Tube BID   midodrine  10 mg Per Tube TID WC   mupirocin ointment  1 Application Nasal BID   nutrition supplement (JUVEN)  1 packet Per Tube BID BM   mouth rinse  15 mL Mouth Rinse Q2H   oxyCODONE  5 mg Per Tube Q6H   pantoprazole (PROTONIX) IV  40 mg Intravenous Q12H   propranolol  10 mg Per Tube Q8H   QUEtiapine  50 mg Per Tube TID   senna  1 tablet Per Tube Daily   sodium chloride flush  10-40 mL Intracatheter Q12H   valproic acid  500 mg Per Tube Daily   valproic acid  750 mg Per Tube QHS   Continuous Infusions:  feeding supplement (OSMOLITE 1.5 CAL) 50 mL/hr at 12/13/23 0600   PRN Meds:.acetaminophen  **OR** acetaminophen, bisacodyl, glycopyrrolate, levalbuterol, lip balm, ondansetron **OR** ondansetron (ZOFRAN) IV, mouth rinse, polyethylene glycol, sodium chloride flush   Critical care time: 40 minutes    Harlon Ditty, AGACNP-BC [facility] Prefer epic messenger for cross cover needs If after hours, please call E-link

## 2023-12-13 NOTE — Plan of Care (Signed)
  Problem: Fluid Volume: Goal: Hemodynamic stability will improve Outcome: Progressing   Problem: Clinical Measurements: Goal: Diagnostic test results will improve Outcome: Progressing Goal: Signs and symptoms of infection will decrease Outcome: Progressing   Problem: Respiratory: Goal: Ability to maintain adequate ventilation will improve Outcome: Progressing   Problem: Activity: Goal: Ability to tolerate increased activity will improve Outcome: Progressing   Problem: Respiratory: Goal: Ability to maintain a clear airway and adequate ventilation will improve Outcome: Progressing   Problem: Clinical Measurements: Goal: Ability to maintain clinical measurements within normal limits will improve Outcome: Progressing Goal: Will remain free from infection Outcome: Progressing   Problem: Activity: Goal: Ability to tolerate increased activity will improve Outcome: Progressing   Problem: Respiratory: Goal: Ability to maintain a clear airway and adequate ventilation will improve Outcome: Progressing   Problem: Role Relationship: Goal: Method of communication will improve Outcome: Progressing

## 2023-12-13 NOTE — TOC Progression Note (Addendum)
 Transition of Care [facility]) - Progression Note    Patient Details  Name: Alex [name] MRN: [id number] Date of Birth: [birth year]  Transition of Care [facility]) CM/SW Contact  Chapman Fitch, RN Phone Number: 12/13/2023, 3:57 PM  Clinical Narrative:     MD confirms that patient will require trach/vent/peg at discharge  Kenney Houseman at [facility] confirms they will not be able to accept back due to patient requiring a Vent.  Reached out to [facility] leadership to advise   Update:  - VM left for Clydie Braun at [facility] rehab [phone] - to discuss referral  - [facility] [phone] - Vm left for [facility]  in admissions  Update:   Per Irving Burton with [facility] - they are unable to accept Medicaid at this time Per Ciera at [facility] she will have the [facility] view, however they currently have a waiting list Expected Discharge Plan: Skilled Nursing Facility Barriers to Discharge: Continued Medical Work up  Expected Discharge Plan and Services       Living arrangements for the past 2 months: Skilled Nursing Facility                                       Social Determinants of Health (SDOH) Interventions SDOH Screenings   Food Insecurity: Patient Unable To Answer (11/20/2023)  Housing: Patient Unable To Answer (11/20/2023)  Transportation Needs: Patient Unable To Answer (11/20/2023)  Utilities: Patient Unable To Answer (11/20/2023)  Financial Resource Strain: Low Risk  (12/02/2021)   Received from [facility]  Tobacco Use: Low Risk  (11/19/2023)    Readmission Risk Interventions     No data to display

## 2023-12-13 NOTE — Plan of Care (Signed)
  Problem: Fluid Volume: Goal: Hemodynamic stability will improve Outcome: Progressing   Problem: Clinical Measurements: Goal: Diagnostic test results will improve Outcome: Progressing Goal: Signs and symptoms of infection will decrease Outcome: Progressing   Problem: Respiratory: Goal: Ability to maintain adequate ventilation will improve Outcome: Progressing   Problem: Activity: Goal: Ability to tolerate increased activity will improve Outcome: Progressing   Problem: Respiratory: Goal: Ability to maintain a clear airway and adequate ventilation will improve Outcome: Progressing   Problem: Role Relationship: Goal: Method of communication will improve Outcome: Progressing   Problem: Education: Goal: Knowledge of General Education information will improve Description: Including pain rating scale, medication(s)/side effects and non-pharmacologic comfort measures Outcome: Progressing   Problem: Health Behavior/Discharge Planning: Goal: Ability to manage health-related needs will improve Outcome: Progressing   Problem: Clinical Measurements: Goal: Ability to maintain clinical measurements within normal limits will improve Outcome: Progressing Goal: Will remain free from infection Outcome: Progressing Goal: Diagnostic test results will improve Outcome: Progressing Goal: Respiratory complications will improve Outcome: Progressing Goal: Cardiovascular complication will be avoided Outcome: Progressing   Problem: Activity: Goal: Risk for activity intolerance will decrease Outcome: Progressing   Problem: Nutrition: Goal: Adequate nutrition will be maintained Outcome: Progressing   Problem: Coping: Goal: Level of anxiety will decrease Outcome: Progressing   Problem: Elimination: Goal: Will not experience complications related to bowel motility Outcome: Progressing Goal: Will not experience complications related to urinary retention Outcome: Progressing   Problem:  Pain Managment: Goal: General experience of comfort will improve and/or be controlled Outcome: Progressing   Problem: Safety: Goal: Ability to remain free from injury will improve Outcome: Progressing   Problem: Skin Integrity: Goal: Risk for impaired skin integrity will decrease Outcome: Progressing   Problem: Activity: Goal: Ability to tolerate increased activity will improve Outcome: Progressing   Problem: Respiratory: Goal: Ability to maintain a clear airway and adequate ventilation will improve Outcome: Progressing   Problem: Role Relationship: Goal: Method of communication will improve Outcome: Progressing
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
## 2023-12-14 DIAGNOSIS — I952 Hypotension due to drugs: Secondary | ICD-10-CM | POA: Diagnosis not present

## 2023-12-14 DIAGNOSIS — A419 Sepsis, unspecified organism: Secondary | ICD-10-CM | POA: Diagnosis not present

## 2023-12-14 DIAGNOSIS — J9601 Acute respiratory failure with hypoxia: Secondary | ICD-10-CM | POA: Diagnosis not present

## 2023-12-14 DIAGNOSIS — J690 Pneumonitis due to inhalation of food and vomit: Secondary | ICD-10-CM | POA: Diagnosis not present

## 2023-12-14 LAB — RENAL FUNCTION PANEL
Albumin: 2.8 g/dL — ABNORMAL LOW (ref 3.5–5.0)
Anion gap: 9 (ref 5–15)
BUN: 11 mg/dL (ref 6–20)
CO2: 26 mmol/L (ref 22–32)
Calcium: 8.8 mg/dL — ABNORMAL LOW (ref 8.9–10.3)
Chloride: 106 mmol/L (ref 98–111)
Creatinine, Ser: 0.42 mg/dL — ABNORMAL LOW (ref 0.61–1.24)
GFR, Estimated: >60 mL/min (ref >60)
Glucose, Bld: 87 mg/dL (ref 70–99)
Phosphorus: 4.2 mg/dL (ref 2.5–4.6)
Potassium: 4.5 mmol/L (ref 3.5–5.1)
Sodium: 141 mmol/L (ref 135–145)

## 2023-12-14 LAB — CBC
HCT: 33.6 % — ABNORMAL LOW (ref 39.0–52.0)
Hemoglobin: 10.7 g/dL — ABNORMAL LOW (ref 13.0–17.0)
MCH: 31 pg (ref 26.0–34.0)
MCHC: 31.8 g/dL (ref 30.0–36.0)
MCV: 97.4 fL (ref 80.0–100.0)
Platelets: 365 10*3/uL (ref 150–400)
RBC: 3.45 MIL/uL — ABNORMAL LOW (ref 4.22–5.81)
RDW: 13.7 % (ref 11.5–15.5)
WBC: 5.7 10*3/uL (ref 4.0–10.5)
nRBC: 0 % (ref 0.0–0.2)

## 2023-12-14 LAB — GLUCOSE, CAPILLARY
Glucose-Capillary: 101 mg/dL — ABNORMAL HIGH (ref 70–99)
Glucose-Capillary: 107 mg/dL — ABNORMAL HIGH (ref 70–99)
Glucose-Capillary: 128 mg/dL — ABNORMAL HIGH (ref 70–99)
Glucose-Capillary: 87 mg/dL (ref 70–99)
Glucose-Capillary: 91 mg/dL (ref 70–99)
Glucose-Capillary: 99 mg/dL (ref 70–99)

## 2023-12-14 LAB — MAGNESIUM: Magnesium: 2.1 mg/dL (ref 1.7–2.4)

## 2023-12-14 MED ORDER — PROPRANOLOL HCL 20 MG PO TABS
10.0000 mg | ORAL_TABLET | Freq: Three times a day (TID) | ORAL | Status: DC
  Administered 2023-12-15 – 2023-12-18 (×11): 10 mg
  Filled 2023-12-14 (×10): qty 1

## 2023-12-14 NOTE — Plan of Care (Signed)
  Problem: Fluid Volume: Goal: Hemodynamic stability will improve Outcome: Progressing   Problem: Clinical Measurements: Goal: Diagnostic test results will improve Outcome: Progressing Goal: Signs and symptoms of infection will decrease Outcome: Progressing   Problem: Respiratory: Goal: Ability to maintain adequate ventilation will improve Outcome: Progressing   Problem: Activity: Goal: Ability to tolerate increased activity will improve Outcome: Progressing

## 2023-12-14 NOTE — Progress Notes (Signed)
 NAME:  Alex [name], MRN:  [id number], DOB:  09/22/1996, LOS: 25 ADMISSION DATE:  11/19/2023, CONSULTATION DATE:  11/20/23 REFERRING MD:  Dr. Arville Care, CHIEF COMPLAINT:  AMS   Brief Pt Description / Synopsis:  28 y.o. male with PMHx significant for chronic TBI with left sided hemiparesis admitted with Acute Metabolic Encephalopathy and Acute Hypoxic Respiratory Failure in the setting of aspiration and Pseudomonas and MRSA Pneumonia requiring intubation and mechanical ventilation. With recurrent aspiration events, required Tracheostomy placement by ENT on 12/07/23.  History of Present Illness:  28 yo M presenting to [facility] ED from outpatient rehab on 11/19/23 for evaluation of altered mental status.   History obtained per chart review and mother's telephone interview, patient unable to participate in interview due to respiratory distress and baseline TBI. 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 He was treated at [facility] for 5 months then discharged to rehab. Per his mother and legal guardian his baseline is: Non verbal but he will yell out sporadic nonsensical words with left sided paralysis. He is able to move his RUE in order to feed himself with finger foods and he has some involuntary movement with that arm, he will swat at you. Similar baseline function with RLE, he can kick and move, but often will lay it bent and to the side. He has enough strength to even try and get out of bed on the right side. She denies any issues with swallowing, but eats mostly soft food. If he is not watched closely with eating he will try to eat all the food at once. The facility staff reported he was at his normal baseline until lunchtime on 11/19/23. It was observed he was more somnolent and not interactive. Staff noted a cough and slightly increased work of breathing. Mom also confirmed noting a congested cough the last time she visited earlier in the week. Staff and mom denied nausea/ vomiting,  but mom reports chronic loose stools.   EMS reported the patient febrile and tachycardic on arrival.   ED course: Upon arrival patient tachycardic and lethargic. Sepsis protocol initiated with antibiotics and IVF resuscitation. Labs significant for mild hypokalemia, otherwise WNL. Initial imaging unremarkable but then patient became hypoxic with SpO2 85% on RA and a CTa was obtained, negative for PE but concerning for aspiration pneumonia.  Medications given: Cefepime, Flagyl, Vancomycin, 2 L IVF bolus, IV contrast Initial Vitals: 97.9, 17, 130, 119/82 & 92 on RA Significant labs: (Labs/ Imaging personally reviewed) EKG : pending Chemistry: Na+: 138, K+: 3.4, BUN/Cr.: 13/ 0.65, Serum CO2/ AG: 26/8 Hematology: WBC: 4.3, Hgb: 14.3,  Lactic/ PCT: 1.1 > 1.4 / pending, COVID-19 & Influenza A/B: negative   CXR 11/19/23: no active disease CT head wo contrast 11/19/23: No evidence of acute intracranial abnormality. Stable multifocal encephalomalacia, likely the sequela of prior trauma. Similar age-advanced cerebral atrophy CT angio chest PE 11/19/23:  No pulmonary embolus. Near complete filling of the right mainstem bronchus with filling extending into the bronchus intermedius, right upper, middle and lower lobe bronchi. Layering debris in the trachea. Findings are highly suspicious for aspiration. Minimal ill-defined patchy and nodular airspace disease in the dependent right lower lobe, likely postobstructive pneumonia.   Disposition: Admitted to [facility] service. While holding in the ED, respiratory status worsened and due to high risk for decompensation PCCM consulted and patient intubated for airway protection.   Please see "Significant Hospital Events" section below for full detailed hospital course.  Pertinent  Medical History  Seizures TBI (09/2019)  Micro Data:  1/27: SARS-CoV-2/flu/RSV PCR>> negative 1/27: Blood culture x 2>> no growth 1/28: MRSA PCR>>Positive  1/28: Tracheal  aspirate>>Pseudomonas aeruginosa and Staphylococcus aureus 1/28: HIV screen>> nonreactive 1/29: BAL>>Pseudomonas aeruginosa & MRSA 2/07: Cdiff>>negative  2/15: Blood x2>>NGTD  2/16: Tracheal aspirate>>Pseudomonas aeruginosa 2/16: MRSA PCR +  Antimicrobials:   Anti-infectives (From admission, onward)    Start     Dose/Rate Route Frequency Ordered Stop   12/10/23 1100  linezolid (ZYVOX) tablet 600 mg  Status:  Discontinued        600 mg Per Tube Every 12 hours 12/10/23 1014 12/12/23 1126   12/08/23 1915  vancomycin (VANCOREADY) IVPB 1250 mg/250 mL  Status:  Discontinued        1,250 mg 166.7 mL/hr over 90 Minutes Intravenous 2 times daily 12/08/23 1821 12/10/23 1013   12/08/23 1915  piperacillin-tazobactam (ZOSYN) IVPB 3.375 g  Status:  Discontinued        3.375 g 12.5 mL/hr over 240 Minutes Intravenous Every 8 hours 12/08/23 1821 12/12/23 1105   12/05/23 1443  ceFAZolin (ANCEF) IVPB 1 g/50 mL premix        over 30 Minutes  Continuous PRN 12/05/23 1443 12/05/23 1443   12/05/23 0000  ceFAZolin (ANCEF) IVPB 2g/100 mL premix        2 g 200 mL/hr over 30 Minutes Intravenous To Radiology 12/04/23 1324 12/05/23 0041   12/01/23 2200  vancomycin (VANCOREADY) IVPB 750 mg/150 mL  Status:  Discontinued        750 mg 150 mL/hr over 60 Minutes Intravenous Every 8 hours 12/01/23 1205 12/01/23 1215   12/01/23 1400  vancomycin (VANCOREADY) IVPB 1250 mg/250 mL  Status:  Discontinued        1,250 mg 166.7 mL/hr over 90 Minutes Intravenous  Once 12/01/23 1205 12/01/23 1215   12/01/23 1315  linezolid (ZYVOX) IVPB 600 mg        600 mg 300 mL/hr over 60 Minutes Intravenous Every 12 hours 12/01/23 1215 12/07/23 2253   12/01/23 0230  piperacillin-tazobactam (ZOSYN) IVPB 3.375 g        3.375 g 12.5 mL/hr over 240 Minutes Intravenous Every 8 hours 12/01/23 0131 12/07/23 1944   11/30/23 1500  levofloxacin (LEVAQUIN) IVPB 750 mg  Status:  Discontinued        750 mg 100 mL/hr over 90 Minutes Intravenous  Every 24 hours 11/30/23 0345 12/01/23 0048   11/28/23 0100  vancomycin (VANCOREADY) IVPB 1250 mg/250 mL       Placed in "Followed by" Linked Group   1,250 mg 166.7 mL/hr over 90 Minutes Intravenous Every 12 hours 11/27/23 1153 11/29/23 1331   11/27/23 1300  vancomycin (VANCOREADY) IVPB 1500 mg/300 mL       Placed in "Followed by" Linked Group   1,500 mg 150 mL/hr over 120 Minutes Intravenous  Once 11/27/23 1153 11/27/23 1516   11/23/23 1500  levofloxacin (LEVAQUIN) IVPB 750 mg        750 mg 100 mL/hr over 90 Minutes Intravenous Every 24 hours 11/23/23 1341 11/29/23 1551   11/21/23 2200  doxycycline (VIBRAMYCIN) 100 mg in sodium chloride 0.9 % 250 mL IVPB  Status:  Discontinued        100 mg 125 mL/hr over 120 Minutes Intravenous Every 12 hours 11/21/23 1425 11/27/23 1148   11/21/23 2000  metroNIDAZOLE (FLAGYL) IVPB 500 mg  Status:  Discontinued        500 mg 100 mL/hr over  60 Minutes Intravenous Every 12 hours 11/21/23 1425 11/24/23 1227   11/20/23 1200  Ampicillin-Sulbactam (UNASYN) 3 g in sodium chloride 0.9 % 100 mL IVPB  Status:  Discontinued        3 g 200 mL/hr over 30 Minutes Intravenous Every 6 hours 11/20/23 1131 11/21/23 1423   11/20/23 0800  cefTRIAXone (ROCEPHIN) 2 g in sodium chloride 0.9 % 100 mL IVPB  Status:  Discontinued        2 g 200 mL/hr over 30 Minutes Intravenous Every 24 hours 11/19/23 2322 11/20/23 0229   11/20/23 0100  azithromycin (ZITHROMAX) 500 mg in sodium chloride 0.9 % 250 mL IVPB  Status:  Discontinued        500 mg 250 mL/hr over 60 Minutes Intravenous Every 24 hours 11/19/23 2322 11/21/23 1423   11/19/23 1545  ceFEPIme (MAXIPIME) 2 g in sodium chloride 0.9 % 100 mL IVPB        2 g 200 mL/hr over 30 Minutes Intravenous  Once 11/19/23 1541 11/19/23 1630   11/19/23 1545  metroNIDAZOLE (FLAGYL) IVPB 500 mg        500 mg 100 mL/hr over 60 Minutes Intravenous  Once 11/19/23 1541 11/19/23 1748   11/19/23 1545  vancomycin (VANCOCIN) IVPB 1000 mg/200 mL  premix        1,000 mg 200 mL/hr over 60 Minutes Intravenous  Once 11/19/23 1541 11/19/23 1917      Significant Hospital Events: Including procedures, antibiotic start and stop dates in addition to other pertinent events   11/20/23: Admit to ICU due to acute hypoxic respiratory failure requiring urgent intubation and mechanical ventilatory support secondary to suspected aspiration and pneumonia 11/21/23- patient moving RUE, mother at bedside we reviewed medical plan. He remains on 95mcg/kg/hr levophed, today plan to rescusitate more aggresively and wean from levophed and potentially extubate post SBP.   He is febrile this am.  He is on zithromax, unasyn 11/22/23- s/p bronch yesterday with aspiration of mucus plugging.  Today resp status improved with liberation protocol in proces. SLP post extubation today. 11/23/23- patient is +for pseudomonas resp cultures.  He is also MRSA pcr +, refined therapy during rounds with pharmacist. He remains on MV 11/24/23- patient is still on vasopressor support weaning down on MV.  Secretions are slightly better.  11/25/23- patient weaned off levophed, failed SBT with tachypnea, tachycardia.  Secretions much improved. 11/26/23- on minimal vent support, unable to perform SBT due to copious secretions from ETT.   11/27/23- on minimal vent support, secretions improved.  Change Doxycycline to Vancomycin.  Plan for SBT as tolerated. 11/28/23-Pt successfully extubated 02/4, currently tolerating HHFNC @35L /35%.  Required low dose precedex overnight to allow suctioning due to excessive secretions. Pt with suspected seizure activity developed tremors in the right upper extremity and became minimally responsive.  Received 1g of iv keppra.    11/29/23-Overnight pt developed sinus tachycardia/svt 140 to 160's improved following 2.5 mg iv metoprolol.  Tolerating RA with no signs of respiratory distress.  Transferring to progressive care unit TRH to pick on 02/7 12/01/23: Pt transferred back to  ICU with severe acute hypoxic respiratory failure initially placed on HHFNC but due to significant hypoxia O2 sats in the 60's he required reintubation and underwent emergent bronchoscopy which revealed thick secretions in the LUL and LLL resulting in mucous plugging. Therapeutic aspiration performed. CXR showed collapse of the LUL secondary to mucus plugging  12/03/23: Pt remains mechanically intubated on minimal vent settings.  ENT consulted for tracheostomy  placement due to recurrent respiratory failure due to inability to clear secretions.  Requiring levophed gtt to maintain map 65 or higher  12/04/23: No acute events overnight on minimal settings pending tracheostomy placement  12/05/23: No acute events overnight, on minimal vent support. Initially on low dose Levophed, now weaned off.  IR placed PEG tube. Pending Tracheostomy placement on Friday. 12/06/23: No acute events overnight, on minimal vent support. Awaiting Tracheostomy placement tomorrow. 12/07/23: No acute events overnight.  Tracheostomy placed this morning by ENT, no events with procedure. Will keep sedated today with newly placed Trach with plan for WUA/SBT tomorrow. 12/08/23: All sedation turned off for WUA will perform SBT or TCT as tolerated.  Pt spike at temp 100.9 F, hypotensive requiring levophed gtt, and tachycardic concerning for possible sepsis.  Blood culture/tracheal aspirate and UA sent.  Restarted broad spectrum abx (vancomycin/zosyn) 12/09/23: Pt no longer requiring levophed gtt.  Tmax 102.4 F 12/10/23: Central Line Removed, not requiring pressors or sedation. Tolearting TC. 12/11/23: Overnight was started back on Gabapentin and scheduled Propanol for possible neurostorming with significant improvement in HR from the 170's to mid 100's 12/12/23: HR remains stable in low 100's, not requiring vasopressors, on minimal vent support, SBT as tolerated.  Complete course of ABX as suspect he is colonized with Pseudomonas and MRSA. 12/13/23:  Overnight with concern for possible tube feeds in ETT tubing. Propanol was decreased due to soft BP. CXR this morning without new infiltrate, no leukocytosis or fever, and bowel pattern normal on KUB, will restart tube feeds. Remains on minimal vent support, execise in PSV as tolerated ~ unable to tolerate less than 10/5 in PSV. 12/14/23: No significant events overnight.  Afebrile, hemodynamically stable.  On minimal vent support, continue with SBT/TCT as tolerated.  Interim History / Subjective:  As outlined above under "Significant Hospital Events" section   Objective   Blood pressure 104/66, pulse (!) 109, temperature 99.6 F (37.6 C), temperature source Axillary, resp. rate 18, height 5' 9.02" (1.753 m), weight 61.1 kg, SpO2 98%.    Vent Mode: PRVC FiO2 (%):  [28 %] 28 % Set Rate:  [15 bmp] 15 bmp Vt Set:  [450 mL] 450 mL PEEP:  [5 cmH20] 5 cmH20 Pressure Support:  [10 cmH20] 10 cmH20   Intake/Output Summary (Last 24 hours) at 12/14/2023 0757 Last data filed at 12/14/2023 0600 Gross per 24 hour  Intake 720 ml  Output 1530 ml  Net -810 ml   Filed Weights   12/12/23 0500 12/13/23 0500 12/14/23 0500  Weight: 61.5 kg 62.8 kg 61.1 kg   Examination: General: Acute on chronically-ill appearing frail male, NAD, receiving mechanical ventilation via Tracheostomy HENT: Atraumatic, normocephalic, neck supple, no JVD, Tracheostomy in place clean, dry, and intact Lungs: Mechanical breath sound throughout, even, non labored, normal effort Cardiovascular: Sinus tachycardia, s1s2, no m/r/g, 2+ radial/2+ distal pulses, trace generalized edema  Abdomen: +BS x4, soft, non distended, PEG tube in place clean, dry, and intact Extremities: Left upper and lower extremities contractured, bilateral foot drop Neuro: Awake, Left-sided hemiparesis with left arm contractured (baseline), opens eyes to voice and tracks, moves right extremities purposefully but not to command GU: External male catheter in  place  Resolved Hospital Problem list     Assessment & Plan:   #Sinus tachycardia/SVT  #Hypotension secondary to sedating medication and possible recurrent sepsis  - Continuous telemetry monitoring - Continue scheduled propanolol 10 mg q8h for possible neurostorming  - IV fluid resuscitation prn and/or vasopressors to maintain map  65 or higher - Continue Midodrine  - Lactic acid is normalized - HS Troponin negative x2  #Acute hypoxic respiratory failure secondary to aspiration, LUL/LLL mucous plugging, and collapse of the LUL due to mucous plugging in the setting of TBI s/p emergent bronchoscopy 02/8 #Mechanical ventilation  Status post Tracheostomy placement with ENT on 12/06/22 -Full vent support, implement lung protective strategies -Plateau pressures less than 30 cm H20 -Wean FiO2 & PEEP as tolerated to maintain O2 sats >92% -Follow intermittent Chest X-ray & ABG as needed -Spontaneous Breathing Trials / Trach collar trials as tolerated -Implement VAP Bundle -Prn Bronchodilators -Completed ABX as above  #Pseudomonas & MRSA pneumonia~ TREATED, SUSPECT HE IS COLONIZED #Aspiration pneumonia~ TREATED #Possible recurrent sepsis  - Trend WBC and monitor fever curve  - Follow cultures  - Completed course of Linezolid and zosyn 2/19   #Chronic Seizure Disorder #Chronic TBI #Sedation needs in setting of mechanical ventilation EEG 02/10: obtained while sedated on propofol and comatose and is abnormal due to severe diffuse slowing indicative of global cerebral dysfunction, medication effect, or both. Epileptiform abnormalities were not seen during this recording.  - Maintain a RASS goal of 0  - scheduled oxycodone per tube 02/15  - Avoid sedating medications as able - Daily wake up assessment - Continue outpatient vimpat and valproic acid  - Continue outpatient baclofen , seroquel and Gabapentin - Seizure precautions   #Dysphagia - TF's via G-tube    Best Practice (right  click and "Reselect all SmartList Selections" daily)   Diet/type: TF's  DVT prophylaxis: LMWH  GI prophylaxis: PPI Lines: N/A Foley:  N/A Code Status:  full code Last date of multidisciplinary goals of care discussion [12/14/23]  2/21: Will update pt's mother when she arrives at bedside.  Labs   CBC: Recent Labs  Lab 12/09/23 0410 12/10/23 0515 12/11/23 0335 12/12/23 0839 12/13/23 0419 12/14/23 0629  WBC 7.7 7.2 4.7 6.4 5.0 5.7  NEUTROABS 5.6 4.7  --   --   --   --   HGB 9.9* 10.0* 10.2* 11.3* 10.1* 10.7*  HCT 30.8* 31.1* 30.6* 34.8* 31.6* 33.6*  MCV 96.6 96.9 94.4 96.7 97.2 97.4  PLT 367 272 344 360 306 365   Basic Metabolic Panel: Recent Labs  Lab 12/10/23 0500 12/10/23 0515 12/11/23 0335 12/12/23 0839 12/13/23 0419 12/14/23 0629  NA 139  --  138 139 138 141  K 3.4*  --  3.6 4.9 4.2 4.5  CL 107  --  104 106 105 106  CO2 25  --  23 23 25 26   GLUCOSE 102*  --  139* 94 113* 87  BUN 13  --  16 16 11 11   CREATININE 0.53*  --  0.47* 0.54* 0.48* 0.42*  CALCIUM 8.2*  --  8.7* 8.9 8.7* 8.8*  MG  --  2.1 2.1 2.2 2.0 2.1  PHOS 3.9  --  3.3 3.0 3.0 4.2   GFR: Estimated Creatinine Clearance: 118.8 mL/min (A) (by C-G formula based on SCr of 0.42 mg/dL (L)). Recent Labs  Lab 12/08/23 1756 12/08/23 1757 12/09/23 0410 12/11/23 0335 12/12/23 0839 12/13/23 0419 12/14/23 0629  PROCALCITON  --  <0.10  --   --   --   --   --   WBC  --   --    < > 4.7 6.4 5.0 5.7  LATICACIDVEN 1.4  --   --   --   --   --   --    < > =  values in this interval not displayed.   Liver Function Tests: Recent Labs  Lab 12/09/23 0410 12/11/23 0335 12/12/23 0839 12/13/23 0419 12/14/23 0629  AST  --  12*  --   --   --   ALT  --  8  --   --   --   ALKPHOS  --  67  --   --   --   BILITOT  --  0.5  --   --   --   PROT  --  6.3*  --   --   --   ALBUMIN 2.2* 2.3* 2.4* 2.8* 2.8*   No results for input(s): "LIPASE", "AMYLASE" in the last 168 hours. No results for input(s): "AMMONIA" in the  last 168 hours.  ABG    Component Value Date/Time   PHART 7.44 12/01/2023 1143   PCO2ART 34 12/01/2023 1143   PO2ART 63 (L) 12/01/2023 1143   HCO3 23.1 12/01/2023 1143   TCO2 28 05/31/2021 0404   ACIDBASEDEF 0.5 12/01/2023 1143   O2SAT 93 12/01/2023 1143   Coagulation Profile: No results for input(s): "INR", "PROTIME" in the last 168 hours.  Cardiac Enzymes: No results for input(s): "CKTOTAL", "CKMB", "CKMBINDEX", "TROPONINI" in the last 168 hours.  HbA1C: Hgb A1c MFr Bld  Date/Time Value Ref Range Status  04/11/2022 06:10 AM 5.1 4.8 - 5.6 % Final    Comment:    (NOTE) Pre diabetes:          5.7%-6.4%  Diabetes:              >6.4%  Glycemic control for   <7.0% adults with diabetes   05/31/2021 04:18 PM 4.9 4.8 - 5.6 % Final    Comment:    (NOTE) Pre diabetes:          5.7%-6.4%  Diabetes:              >6.4%  Glycemic control for   <7.0% adults with diabetes    CBG: Recent Labs  Lab 12/13/23 1658 12/13/23 1939 12/13/23 2331 12/14/23 0335 12/14/23 0734  GLUCAP 88 81 84 87 101*    Review of Systems:   Unable to assess pt nonverbal and Tracheostomy in place  Past Medical History:  He,  has a past medical history of 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   Surgical History:   Past Surgical History:  Procedure Laterality Date   GASTROSTOMY TUBE PLACEMENT     IR GASTROSTOMY TUBE MOD SED  12/05/2023   TRACHEOSTOMY TUBE PLACEMENT N/A 12/07/2023   Procedure: TRACHEOSTOMY;  Surgeon: Lanell Persons, MD;  Location: [facility] ORS;  Service: ENT;  Laterality: N/A;     Social History:   reports that he has never smoked. He has never used smokeless tobacco. He reports that he does not currently use alcohol. He reports that he does not use drugs.   Family History:  His family history is not on file.   Allergies No Known Allergies   Home Medications  Prior to Admission  medications   Medication Sig Start Date End Date Taking? Authorizing Provider  baclofen (LIORESAL) 10 MG tablet Take 10 mg by mouth 3 (three) times daily.   Yes [provider]  diazePAM, 15 MG Dose, (VALTOCO 15 MG DOSE) 2 x 7.5 MG/0.1ML LQPK Spray 7.5 mg into each nostril in the event of a seizure 04/20/22  Yes Zigmund Daniel., MD  gabapentin (NEURONTIN)  400 MG capsule Take 1 capsule (400 mg total) by mouth 3 (three) times daily. 04/20/22 11/20/23 Yes Zigmund Daniel., MD  lacosamide 100 MG TABS Take 1 tablet (100 mg total) by mouth 2 (two) times daily. 04/20/22 11/20/23 Yes Zigmund Daniel., MD  melatonin 5 MG TABS Take 10 mg by mouth at bedtime.   Yes [provider]  QUEtiapine (SEROQUEL XR) 200 MG 24 hr tablet Take 200 mg by mouth in the morning.   Yes [provider]  QUEtiapine (SEROQUEL) 25 MG tablet Take 25 mg by mouth at bedtime.   Yes [provider]  valproic acid (DEPAKENE) 250 MG capsule Take 4 capsules (1,000 mg total) by mouth 2 (two) times daily. Patient taking differently: Take 500 mg by mouth every morning. 04/20/22 11/20/23 Yes Zigmund Daniel., MD  valproic acid (DEPAKENE) 250 MG capsule Take 750 mg by mouth every evening.   Yes [provider]   Scheduled Meds:  baclofen  10 mg Per Tube TID   Chlorhexidine Gluconate Cloth  6 each Topical PC lunch   enoxaparin (LOVENOX) injection  40 mg Subcutaneous Q24H   feeding supplement (PROSource TF20)  60 mL Per Tube Daily   free water  30 mL Per Tube Q4H   gabapentin  300 mg Per Tube Q8H   lacosamide  100 mg Per Tube BID   midodrine  10 mg Per Tube TID WC   nutrition supplement (JUVEN)  1 packet Per Tube BID BM   mouth rinse  15 mL Mouth Rinse Q2H   oxyCODONE  5 mg Per Tube Q6H   pantoprazole (PROTONIX) IV  40 mg Intravenous Q12H   propranolol  10 mg Per Tube Q8H   QUEtiapine  50 mg Per Tube TID   sodium chloride flush  10-40 mL Intracatheter Q12H   valproic acid   500 mg Per Tube Daily   valproic acid  750 mg Per Tube QHS   Continuous Infusions:  feeding supplement (OSMOLITE 1.5 CAL) 50 mL/hr at 12/14/23 0600   PRN Meds:.acetaminophen **OR** acetaminophen, bisacodyl, glycopyrrolate, levalbuterol, lip balm, ondansetron **OR** ondansetron (ZOFRAN) IV, mouth rinse, polyethylene glycol, senna, sodium chloride flush   Critical care time: 40 minutes    Harlon Ditty, AGACNP-BC [facility] Prefer epic messenger for cross cover needs If after hours, please call E-link

## 2023-12-14 NOTE — TOC Progression Note (Signed)
 Transition of Care [facility]) - Progression Note    Patient Details  Name: Alex [name] MRN: [id number] Date of Birth: 08-07-1996  Transition of Care [facility]) CM/SW Contact  [name], LCSW Phone Number: 12/14/2023, 11:04 AM  Clinical Narrative:    [name] Admissions [phone]. Spoke with Alex [name] who states they do accept Medicaid - she states CSW will need to speak with one of their LTAC Representatives and transferred CSW to their line. Left a VM requesting a return call.    Expected Discharge Plan: Skilled Nursing Facility Barriers to Discharge: Continued Medical Work up  Expected Discharge Plan and Services       Living arrangements for the past 2 months: Skilled Nursing Facility                                       Social Determinants of Health (SDOH) Interventions SDOH Screenings   Food Insecurity: Patient Unable To Answer (11/20/2023)  Housing: Patient Unable To Answer (11/20/2023)  Transportation Needs: Patient Unable To Answer (11/20/2023)  Utilities: Patient Unable To Answer (11/20/2023)  Financial Resource Strain: Low Risk  (12/02/2021)   Received from [facility]  Tobacco Use: Low Risk  (11/19/2023)    Readmission Risk Interventions     No data to display

## 2023-12-14 NOTE — TOC Progression Note (Signed)
 Transition of Care [facility]) - Progression Note    Patient Details  Name: Alex [name] MRN: [id number] Date of Birth: 1996-04-28  Transition of Care [facility]) CM/SW Contact  Chapman Fitch, RN Phone Number: 12/14/2023, 10:09 AM  Clinical Narrative:     - [facility]) in [place] [phone] - Spoke with Sue Lush in admission.  They are unable to accept medicaid as a payor.  I have left a message on their case managers voicemail requesting a return call to determine if they are aware of any places/resources in the state that could meet patients needs     -[facility]) in [place] [phone] -  Spoke with Selena Batten in Admission.  She states they are unable to accept Medicaid as a payor.  She is not aware of any facilities in the state that accept medicaid   Expected Discharge Plan: Skilled Nursing Facility Barriers to Discharge: Continued Medical Work up  Expected Discharge Plan and Services       Living arrangements for the past 2 months: Skilled Nursing Facility                                       Social Determinants of Health (SDOH) Interventions SDOH Screenings   Food Insecurity: Patient Unable To Answer (11/20/2023)  Housing: Patient Unable To Answer (11/20/2023)  Transportation Needs: Patient Unable To Answer (11/20/2023)  Utilities: Patient Unable To Answer (11/20/2023)  Financial Resource Strain: Low Risk  (12/02/2021)   Received from [facility]  Tobacco Use: Low Risk  (11/19/2023)    Readmission Risk Interventions     No data to display
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
## 2023-12-14 NOTE — Plan of Care (Signed)
  Problem: Fluid Volume: Goal: Hemodynamic stability will improve Outcome: Progressing   Problem: Clinical Measurements: Goal: Diagnostic test results will improve Outcome: Progressing Goal: Signs and symptoms of infection will decrease Outcome: Progressing   Problem: Respiratory: Goal: Ability to maintain adequate ventilation will improve Outcome: Progressing   Problem: Activity: Goal: Ability to tolerate increased activity will improve Outcome: Progressing   Problem: Respiratory: Goal: Ability to maintain a clear airway and adequate ventilation will improve Outcome: Progressing   Problem: Clinical Measurements: Goal: Ability to maintain clinical measurements within normal limits will improve Outcome: Progressing Goal: Will remain free from infection Outcome: Progressing Goal: Diagnostic test results will improve Outcome: Progressing Goal: Respiratory complications will improve Outcome: Progressing   Problem: Activity: Goal: Risk for activity intolerance will decrease Outcome: Progressing   Problem: Coping: Goal: Level of anxiety will decrease Outcome: Progressing   Problem: Elimination: Goal: Will not experience complications related to urinary retention Outcome: Progressing   Problem: Safety: Goal: Ability to remain free from injury will improve Outcome: Progressing   Problem: Skin Integrity: Goal: Risk for impaired skin integrity will decrease Outcome: Progressing   Problem: Activity: Goal: Ability to tolerate increased activity will improve Outcome: Progressing   Problem: Respiratory: Goal: Ability to maintain a clear airway and adequate ventilation will improve Outcome: Progressing   Problem: Role Relationship: Goal: Method of communication will improve Outcome: Progressing

## 2023-12-15 DIAGNOSIS — A419 Sepsis, unspecified organism: Secondary | ICD-10-CM | POA: Diagnosis not present

## 2023-12-15 DIAGNOSIS — J9601 Acute respiratory failure with hypoxia: Secondary | ICD-10-CM | POA: Diagnosis not present

## 2023-12-15 DIAGNOSIS — J690 Pneumonitis due to inhalation of food and vomit: Secondary | ICD-10-CM | POA: Diagnosis not present

## 2023-12-15 LAB — RENAL FUNCTION PANEL
Albumin: 3.1 g/dL — ABNORMAL LOW (ref 3.5–5.0)
Anion gap: 12 (ref 5–15)
BUN: 19 mg/dL (ref 6–20)
CO2: 26 mmol/L (ref 22–32)
Calcium: 9.1 mg/dL (ref 8.9–10.3)
Chloride: 101 mmol/L (ref 98–111)
Creatinine, Ser: 0.44 mg/dL — ABNORMAL LOW (ref 0.61–1.24)
GFR, Estimated: >60 mL/min (ref >60)
Glucose, Bld: 104 mg/dL — ABNORMAL HIGH (ref 70–99)
Phosphorus: 4.4 mg/dL (ref 2.5–4.6)
Potassium: 4.2 mmol/L (ref 3.5–5.1)
Sodium: 139 mmol/L (ref 135–145)

## 2023-12-15 LAB — CBC
HCT: 33.1 % — ABNORMAL LOW (ref 39.0–52.0)
Hemoglobin: 10.6 g/dL — ABNORMAL LOW (ref 13.0–17.0)
MCH: 30.9 pg (ref 26.0–34.0)
MCHC: 32 g/dL (ref 30.0–36.0)
MCV: 96.5 fL (ref 80.0–100.0)
Platelets: 387 10*3/uL (ref 150–400)
RBC: 3.43 MIL/uL — ABNORMAL LOW (ref 4.22–5.81)
RDW: 13.4 % (ref 11.5–15.5)
WBC: 6.3 10*3/uL (ref 4.0–10.5)
nRBC: 0 % (ref 0.0–0.2)

## 2023-12-15 LAB — GLUCOSE, CAPILLARY
Glucose-Capillary: 101 mg/dL — ABNORMAL HIGH (ref 70–99)
Glucose-Capillary: 91 mg/dL (ref 70–99)
Glucose-Capillary: 99 mg/dL (ref 70–99)
Glucose-Capillary: 96 mg/dL (ref 70–99)

## 2023-12-15 NOTE — Plan of Care (Signed)
  Problem: Fluid Volume: Goal: Hemodynamic stability will improve Outcome: Progressing   Problem: Clinical Measurements: Goal: Diagnostic test results will improve Outcome: Progressing   Problem: Role Relationship: Goal: Method of communication will improve Outcome: Progressing
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
## 2023-12-15 NOTE — Progress Notes (Signed)
 NAME:  Alex [name], MRN:  [id number], DOB:  [birth year], LOS: 26 ADMISSION DATE:  11/19/2023, CONSULTATION DATE:  11/20/23 REFERRING MD:  Dr. Arville Care, CHIEF COMPLAINT:  AMS   Brief Pt Description / Synopsis:  28 y.o. male with PMHx significant for chronic TBI with left sided hemiparesis admitted with Acute Metabolic Encephalopathy and Acute Hypoxic Respiratory Failure in the setting of aspiration and Pseudomonas and MRSA Pneumonia requiring intubation and mechanical ventilation. With recurrent aspiration events, required Tracheostomy placement by ENT on 12/07/23.  History of Present Illness:  28 yo M presenting to [facility] ED from outpatient rehab on 11/19/23 for evaluation of altered mental status.   History obtained per chart review and mother's telephone interview, patient unable to participate in interview due to respiratory distress and baseline TBI. 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 He was treated at [facility] for 5 months then discharged to rehab. Per his mother and legal guardian his baseline is: Non verbal but he will yell out sporadic nonsensical words with left sided paralysis. He is able to move his RUE in order to feed himself with finger foods and he has some involuntary movement with that arm, he will swat at you. Similar baseline function with RLE, he can kick and move, but often will lay it bent and to the side. He has enough strength to even try and get out of bed on the right side. She denies any issues with swallowing, but eats mostly soft food. If he is not watched closely with eating he will try to eat all the food at once. The facility staff reported he was at his normal baseline until lunchtime on 11/19/23. It was observed he was more somnolent and not interactive. Staff noted a cough and slightly increased work of breathing. Mom also confirmed noting a congested cough the last time she visited earlier in the week. Staff and mom denied nausea/ vomiting,  but mom reports chronic loose stools.   EMS reported the patient febrile and tachycardic on arrival.   ED course: Upon arrival patient tachycardic and lethargic. Sepsis protocol initiated with antibiotics and IVF resuscitation. Labs significant for mild hypokalemia, otherwise WNL. Initial imaging unremarkable but then patient became hypoxic with SpO2 85% on RA and a CTa was obtained, negative for PE but concerning for aspiration pneumonia.  Medications given: Cefepime, Flagyl, Vancomycin, 2 L IVF bolus, IV contrast Initial Vitals: 97.9, 17, 130, 119/82 & 92 on RA Significant labs: (Labs/ Imaging personally reviewed) EKG : pending Chemistry: Na+: 138, K+: 3.4, BUN/Cr.: 13/ 0.65, Serum CO2/ AG: 26/8 Hematology: WBC: 4.3, Hgb: 14.3,  Lactic/ PCT: 1.1 > 1.4 / pending, COVID-19 & Influenza A/B: negative   CXR 11/19/23: no active disease CT head wo contrast 11/19/23: No evidence of acute intracranial abnormality. Stable multifocal encephalomalacia, likely the sequela of prior trauma. Similar age-advanced cerebral atrophy CT angio chest PE 11/19/23:  No pulmonary embolus. Near complete filling of the right mainstem bronchus with filling extending into the bronchus intermedius, right upper, middle and lower lobe bronchi. Layering debris in the trachea. Findings are highly suspicious for aspiration. Minimal ill-defined patchy and nodular airspace disease in the dependent right lower lobe, likely postobstructive pneumonia.   Disposition: Admitted to [facility] service. While holding in the ED, respiratory status worsened and due to high risk for decompensation PCCM consulted and patient intubated for airway protection.   Please see "Significant Hospital Events" section below for full detailed hospital course.  Pertinent  Medical History  Seizures TBI (09/2019)  Micro Data:  1/27: SARS-CoV-2/flu/RSV PCR>> negative 1/27: Blood culture x 2>> no growth 1/28: MRSA PCR>>Positive  1/28: Tracheal  aspirate>>Pseudomonas aeruginosa and Staphylococcus aureus 1/28: HIV screen>> nonreactive 1/29: BAL>>Pseudomonas aeruginosa & MRSA 2/07: Cdiff>>negative  2/15: Blood x2>>NGTD  2/16: Tracheal aspirate>>Pseudomonas aeruginosa 2/16: MRSA PCR +  Antimicrobials:   Anti-infectives (From admission, onward)    Start     Dose/Rate Route Frequency Ordered Stop   12/10/23 1100  linezolid (ZYVOX) tablet 600 mg  Status:  Discontinued        600 mg Per Tube Every 12 hours 12/10/23 1014 12/12/23 1126   12/08/23 1915  vancomycin (VANCOREADY) IVPB 1250 mg/250 mL  Status:  Discontinued        1,250 mg 166.7 mL/hr over 90 Minutes Intravenous 2 times daily 12/08/23 1821 12/10/23 1013   12/08/23 1915  piperacillin-tazobactam (ZOSYN) IVPB 3.375 g  Status:  Discontinued        3.375 g 12.5 mL/hr over 240 Minutes Intravenous Every 8 hours 12/08/23 1821 12/12/23 1105   12/05/23 1443  ceFAZolin (ANCEF) IVPB 1 g/50 mL premix        over 30 Minutes  Continuous PRN 12/05/23 1443 12/05/23 1443   12/05/23 0000  ceFAZolin (ANCEF) IVPB 2g/100 mL premix        2 g 200 mL/hr over 30 Minutes Intravenous To Radiology 12/04/23 1324 12/05/23 0041   12/01/23 2200  vancomycin (VANCOREADY) IVPB 750 mg/150 mL  Status:  Discontinued        750 mg 150 mL/hr over 60 Minutes Intravenous Every 8 hours 12/01/23 1205 12/01/23 1215   12/01/23 1400  vancomycin (VANCOREADY) IVPB 1250 mg/250 mL  Status:  Discontinued        1,250 mg 166.7 mL/hr over 90 Minutes Intravenous  Once 12/01/23 1205 12/01/23 1215   12/01/23 1315  linezolid (ZYVOX) IVPB 600 mg        600 mg 300 mL/hr over 60 Minutes Intravenous Every 12 hours 12/01/23 1215 12/07/23 2253   12/01/23 0230  piperacillin-tazobactam (ZOSYN) IVPB 3.375 g        3.375 g 12.5 mL/hr over 240 Minutes Intravenous Every 8 hours 12/01/23 0131 12/07/23 1944   11/30/23 1500  levofloxacin (LEVAQUIN) IVPB 750 mg  Status:  Discontinued        750 mg 100 mL/hr over 90 Minutes Intravenous  Every 24 hours 11/30/23 0345 12/01/23 0048   11/28/23 0100  vancomycin (VANCOREADY) IVPB 1250 mg/250 mL       Placed in "Followed by" Linked Group   1,250 mg 166.7 mL/hr over 90 Minutes Intravenous Every 12 hours 11/27/23 1153 11/29/23 1331   11/27/23 1300  vancomycin (VANCOREADY) IVPB 1500 mg/300 mL       Placed in "Followed by" Linked Group   1,500 mg 150 mL/hr over 120 Minutes Intravenous  Once 11/27/23 1153 11/27/23 1516   11/23/23 1500  levofloxacin (LEVAQUIN) IVPB 750 mg        750 mg 100 mL/hr over 90 Minutes Intravenous Every 24 hours 11/23/23 1341 11/29/23 1551   11/21/23 2200  doxycycline (VIBRAMYCIN) 100 mg in sodium chloride 0.9 % 250 mL IVPB  Status:  Discontinued        100 mg 125 mL/hr over 120 Minutes Intravenous Every 12 hours 11/21/23 1425 11/27/23 1148   11/21/23 2000  metroNIDAZOLE (FLAGYL) IVPB 500 mg  Status:  Discontinued        500 mg 100 mL/hr over  60 Minutes Intravenous Every 12 hours 11/21/23 1425 11/24/23 1227   11/20/23 1200  Ampicillin-Sulbactam (UNASYN) 3 g in sodium chloride 0.9 % 100 mL IVPB  Status:  Discontinued        3 g 200 mL/hr over 30 Minutes Intravenous Every 6 hours 11/20/23 1131 11/21/23 1423   11/20/23 0800  cefTRIAXone (ROCEPHIN) 2 g in sodium chloride 0.9 % 100 mL IVPB  Status:  Discontinued        2 g 200 mL/hr over 30 Minutes Intravenous Every 24 hours 11/19/23 2322 11/20/23 0229   11/20/23 0100  azithromycin (ZITHROMAX) 500 mg in sodium chloride 0.9 % 250 mL IVPB  Status:  Discontinued        500 mg 250 mL/hr over 60 Minutes Intravenous Every 24 hours 11/19/23 2322 11/21/23 1423   11/19/23 1545  ceFEPIme (MAXIPIME) 2 g in sodium chloride 0.9 % 100 mL IVPB        2 g 200 mL/hr over 30 Minutes Intravenous  Once 11/19/23 1541 11/19/23 1630   11/19/23 1545  metroNIDAZOLE (FLAGYL) IVPB 500 mg        500 mg 100 mL/hr over 60 Minutes Intravenous  Once 11/19/23 1541 11/19/23 1748   11/19/23 1545  vancomycin (VANCOCIN) IVPB 1000 mg/200 mL  premix        1,000 mg 200 mL/hr over 60 Minutes Intravenous  Once 11/19/23 1541 11/19/23 1917      Significant Hospital Events: Including procedures, antibiotic start and stop dates in addition to other pertinent events   11/20/23: Admit to ICU due to acute hypoxic respiratory failure requiring urgent intubation and mechanical ventilatory support secondary to suspected aspiration and pneumonia 11/21/23- patient moving RUE, mother at bedside we reviewed medical plan. He remains on 81mcg/kg/hr levophed, today plan to rescusitate more aggresively and wean from levophed and potentially extubate post SBP.   He is febrile this am.  He is on zithromax, unasyn 11/22/23- s/p bronch yesterday with aspiration of mucus plugging.  Today resp status improved with liberation protocol in proces. SLP post extubation today. 11/23/23- patient is +for pseudomonas resp cultures.  He is also MRSA pcr +, refined therapy during rounds with pharmacist. He remains on MV 11/24/23- patient is still on vasopressor support weaning down on MV.  Secretions are slightly better.  11/25/23- patient weaned off levophed, failed SBT with tachypnea, tachycardia.  Secretions much improved. 11/26/23- on minimal vent support, unable to perform SBT due to copious secretions from ETT.   11/27/23- on minimal vent support, secretions improved.  Change Doxycycline to Vancomycin.  Plan for SBT as tolerated. 11/28/23-Pt successfully extubated 02/4, currently tolerating HHFNC @35L /35%.  Required low dose precedex overnight to allow suctioning due to excessive secretions. Pt with suspected seizure activity developed tremors in the right upper extremity and became minimally responsive.  Received 1g of iv keppra.    11/29/23-Overnight pt developed sinus tachycardia/svt 140 to 160's improved following 2.5 mg iv metoprolol.  Tolerating RA with no signs of respiratory distress.  Transferring to progressive care unit TRH to pick on 02/7 12/01/23: Pt transferred back to  ICU with severe acute hypoxic respiratory failure initially placed on HHFNC but due to significant hypoxia O2 sats in the 60's he required reintubation and underwent emergent bronchoscopy which revealed thick secretions in the LUL and LLL resulting in mucous plugging. Therapeutic aspiration performed. CXR showed collapse of the LUL secondary to mucus plugging  12/03/23: Pt remains mechanically intubated on minimal vent settings.  ENT consulted for tracheostomy  placement due to recurrent respiratory failure due to inability to clear secretions.  Requiring levophed gtt to maintain map 65 or higher  12/04/23: No acute events overnight on minimal settings pending tracheostomy placement  12/05/23: No acute events overnight, on minimal vent support. Initially on low dose Levophed, now weaned off.  IR placed PEG tube. Pending Tracheostomy placement on Friday. 12/06/23: No acute events overnight, on minimal vent support. Awaiting Tracheostomy placement tomorrow. 12/07/23: No acute events overnight.  Tracheostomy placed this morning by ENT, no events with procedure. Will keep sedated today with newly placed Trach with plan for WUA/SBT tomorrow. 12/08/23: All sedation turned off for WUA will perform SBT or TCT as tolerated.  Pt spike at temp 100.9 F, hypotensive requiring levophed gtt, and tachycardic concerning for possible sepsis.  Blood culture/tracheal aspirate and UA sent.  Restarted broad spectrum abx (vancomycin/zosyn) 12/09/23: Pt no longer requiring levophed gtt.  Tmax 102.4 F 12/10/23: Central Line Removed, not requiring pressors or sedation. Tolearting TC. 12/11/23: Overnight was started back on Gabapentin and scheduled Propanol for possible neurostorming with significant improvement in HR from the 170's to mid 100's 12/12/23: HR remains stable in low 100's, not requiring vasopressors, on minimal vent support, SBT as tolerated.  Complete course of ABX as suspect he is colonized with Pseudomonas and MRSA. 12/13/23:  Overnight with concern for possible tube feeds in ETT tubing. Propanol was decreased due to soft BP. CXR this morning without new infiltrate, no leukocytosis or fever, and bowel pattern normal on KUB, will restart tube feeds. Remains on minimal vent support, execise in PSV as tolerated ~ unable to tolerate less than 10/5 in PSV. 12/14/23: No significant events overnight.  Afebrile, hemodynamically stable.  On minimal vent support, continue with SBT/TCT as tolerated. 12/15/23: No change in patient's condition.  Remained on full vent support overnight.  Now on pressure support and tolerating.  Interim History / Subjective:  Afebrile and hemodynamically stable pressure support.  Continues to require full vent support overnight.  No change in patient's condition  Objective   Blood pressure 108/71, pulse 94, temperature 99.6 F (37.6 C), temperature source Axillary, resp. rate 15, height 5' 9.02" (1.753 m), weight 61.1 kg, SpO2 99%.    Vent Mode: PSV FiO2 (%):  [28 %] 28 % Set Rate:  [15 bmp] 15 bmp Vt Set:  [450 mL] 450 mL PEEP:  [5 cmH20] 5 cmH20 Pressure Support:  [10 cmH20] 10 cmH20 Plateau Pressure:  [15 cmH20] 15 cmH20   Intake/Output Summary (Last 24 hours) at 12/15/2023 4098 Last data filed at 12/15/2023 0600 Gross per 24 hour  Intake 1559 ml  Output 950 ml  Net 609 ml   Filed Weights   12/12/23 0500 12/13/23 0500 12/14/23 0500  Weight: 61.5 kg 62.8 kg 61.1 kg   Examination: General: Acute on chronically-ill appearing frail male, NAD, receiving mechanical ventilation via Tracheostomy HENT: Atraumatic, normocephalic, neck supple, no JVD, Tracheostomy in place clean, dry, and intact Lungs: Mechanical breath sound throughout, even, non labored, normal effort Cardiovascular: Sinus tachycardia, s1s2, no m/r/g, 2+ radial/2+ distal pulses, trace generalized edema  Abdomen: +BS x4, soft, non distended, PEG tube in place clean, dry, and intact Extremities: Left upper and lower extremities  contractured, but able to move right upper extremity, bilateral foot drop Neuro: Awake, Left-sided hemiparesis with left arm contractured (baseline), opens eyes to voice and tracks, moves right extremities purposefully but not to command GU: External male catheter in place  Resolved Hospital Problem list  Assessment & Plan:   #Sinus tachycardia/SVT  #Hypotension secondary to sedating medication and possible recurrent sepsis  -Heart rate improved.-  Continuous telemetry monitoring - Continue scheduled propanolol 10 mg q8h for possible neurostorming  - IV fluid resuscitation prn and/or vasopressors to maintain map 65 or higher - Continue Midodrine   #Acute hypoxic respiratory failure secondary to aspiration, LUL/LLL mucous plugging, and collapse of the LUL due to mucous plugging in the setting of TBI s/p emergent bronchoscopy 02/8 #Mechanical ventilation  Status post Tracheostomy placement with ENT on 12/06/22 -Full vent support, implement lung protective strategies as needed -Pressure support trials with PEEP of 5 and rate of 10. -FiO2 down to 28%; titrate FiO2 to maintain SpO2 greater than 90% -Chest x-ray and ABG as needed  -Spontaneous Breathing Trials / Trach collar trials as tolerated -Implement VAP Bundle -Prn Bronchodilators -Plan is for patient to be placed in LTAC  #Pseudomonas & MRSA pneumonia~ TREATED, SUSPECT HE IS COLONIZED #Aspiration pneumonia~ TREATED #Possible recurrent sepsis  - Trend WBC and monitor fever curve  - Follow cultures  - Completed course of Linezolid and zosyn 2/19; remains afebrile  #Chronic Seizure Disorder #Chronic TBI #Sedation needs in setting of mechanical ventilation EEG 02/10: obtained while sedated on propofol and comatose and is abnormal due to severe diffuse slowing indicative of global cerebral dysfunction, medication effect, or both. Epileptiform abnormalities were not seen during this recording.  - Maintain a RASS goal of 0  -  scheduled oxycodone per tube 02/15  - Avoid sedating medications as able - Daily wake up assessment - Continue outpatient vimpat and valproic acid  - Continue outpatient baclofen , seroquel and Gabapentin - Seizure precautions   #Dysphagia - TF's via G-tube    Best Practice (right click and "Reselect all SmartList Selections" daily)   Diet/type: TF's  DVT prophylaxis: LMWH  GI prophylaxis: PPI Lines: N/A Foley:  N/A Code Status:  full code Last date of multidisciplinary goals of care discussion [12/14/23]  2/22: No family at bedside.  Will update when available  Labs   CBC: Recent Labs  Lab 12/09/23 0410 12/10/23 0515 12/11/23 0335 12/12/23 2952 12/13/23 0419 12/14/23 0629 12/15/23 0237  WBC 7.7 7.2 4.7 6.4 5.0 5.7 6.3  NEUTROABS 5.6 4.7  --   --   --   --   --   HGB 9.9* 10.0* 10.2* 11.3* 10.1* 10.7* 10.6*  HCT 30.8* 31.1* 30.6* 34.8* 31.6* 33.6* 33.1*  MCV 96.6 96.9 94.4 96.7 97.2 97.4 96.5  PLT 367 272 344 360 306 365 387   Basic Metabolic Panel: Recent Labs  Lab 12/10/23 0515 12/11/23 0335 12/12/23 0839 12/13/23 0419 12/14/23 0629 12/15/23 0237  NA  --  138 139 138 141 139  K  --  3.6 4.9 4.2 4.5 4.2  CL  --  104 106 105 106 101  CO2  --  23 23 25 26 26   GLUCOSE  --  139* 94 113* 87 104*  BUN  --  16 16 11 11 19   CREATININE  --  0.47* 0.54* 0.48* 0.42* 0.44*  CALCIUM  --  8.7* 8.9 8.7* 8.8* 9.1  MG 2.1 2.1 2.2 2.0 2.1  --   PHOS  --  3.3 3.0 3.0 4.2 4.4   GFR: Estimated Creatinine Clearance: 118.8 mL/min (A) (by C-G formula based on SCr of 0.44 mg/dL (L)). Recent Labs  Lab 12/08/23 1756 12/08/23 1757 12/09/23 0410 12/12/23 8413 12/13/23 0419 12/14/23 0629 12/15/23 0237  PROCALCITON  --  <  0.10  --   --   --   --   --   WBC  --   --    < > 6.4 5.0 5.7 6.3  LATICACIDVEN 1.4  --   --   --   --   --   --    < > = values in this interval not displayed.   Liver Function Tests: Recent Labs  Lab 12/11/23 0335 12/12/23 0839 12/13/23 0419  12/14/23 0629 12/15/23 0237  AST 12*  --   --   --   --   ALT 8  --   --   --   --   ALKPHOS 67  --   --   --   --   BILITOT 0.5  --   --   --   --   PROT 6.3*  --   --   --   --   ALBUMIN 2.3* 2.4* 2.8* 2.8* 3.1*   No results for input(s): "LIPASE", "AMYLASE" in the last 168 hours. No results for input(s): "AMMONIA" in the last 168 hours.  ABG    Component Value Date/Time   PHART 7.44 12/01/2023 1143   PCO2ART 34 12/01/2023 1143   PO2ART 63 (L) 12/01/2023 1143   HCO3 23.1 12/01/2023 1143   TCO2 28 05/31/2021 0404   ACIDBASEDEF 0.5 12/01/2023 1143   O2SAT 93 12/01/2023 1143   Coagulation Profile: No results for input(s): "INR", "PROTIME" in the last 168 hours.  Cardiac Enzymes: No results for input(s): "CKTOTAL", "CKMB", "CKMBINDEX", "TROPONINI" in the last 168 hours.  HbA1C: Hgb A1c MFr Bld  Date/Time Value Ref Range Status  04/11/2022 06:10 AM 5.1 4.8 - 5.6 % Final    Comment:    (NOTE) Pre diabetes:          5.7%-6.4%  Diabetes:              >6.4%  Glycemic control for   <7.0% adults with diabetes   05/31/2021 04:18 PM 4.9 4.8 - 5.6 % Final    Comment:    (NOTE) Pre diabetes:          5.7%-6.4%  Diabetes:              >6.4%  Glycemic control for   <7.0% adults with diabetes    CBG: Recent Labs  Lab 12/14/23 1816 12/14/23 1957 12/14/23 2338 12/15/23 0333 12/15/23 0751  GLUCAP 99 128* 107* 101* 91    Review of Systems:   Unable to assess pt nonverbal and Tracheostomy in place  Past Medical History:  He,  has a past medical history of 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   Surgical History:   Past Surgical History:  Procedure Laterality Date   GASTROSTOMY TUBE PLACEMENT     IR GASTROSTOMY TUBE MOD SED  12/05/2023   TRACHEOSTOMY TUBE PLACEMENT N/A 12/07/2023   Procedure: TRACHEOSTOMY;  Surgeon: Lanell Persons, MD;  Location: [facility] ORS;  Service:  ENT;  Laterality: N/A;     Social History:   reports that he has never smoked. He has never used smokeless tobacco. He reports that he does not currently use alcohol. He reports that he does not use drugs.   Family History:  His family history is not on file.   Allergies No Known Allergies   Home Medications  Prior to Admission medications   Medication Sig Start Date End Date Taking? Authorizing Provider  baclofen (LIORESAL) 10 MG tablet Take 10 mg by mouth 3 (three) times daily.   Yes [provider]  diazePAM, 15 MG Dose, (VALTOCO 15 MG DOSE) 2 x 7.5 MG/0.1ML LQPK Spray 7.5 mg into each nostril in the event of a seizure 04/20/22  Yes Zigmund Daniel., MD  gabapentin (NEURONTIN) 400 MG capsule Take 1 capsule (400 mg total) by mouth 3 (three) times daily. 04/20/22 11/20/23 Yes Zigmund Daniel., MD  lacosamide 100 MG TABS Take 1 tablet (100 mg total) by mouth 2 (two) times daily. 04/20/22 11/20/23 Yes Zigmund Daniel., MD  melatonin 5 MG TABS Take 10 mg by mouth at bedtime.   Yes [provider]  QUEtiapine (SEROQUEL XR) 200 MG 24 hr tablet Take 200 mg by mouth in the morning.   Yes [provider]  QUEtiapine (SEROQUEL) 25 MG tablet Take 25 mg by mouth at bedtime.   Yes [provider]  valproic acid (DEPAKENE) 250 MG capsule Take 4 capsules (1,000 mg total) by mouth 2 (two) times daily. Patient taking differently: Take 500 mg by mouth every morning. 04/20/22 11/20/23 Yes Zigmund Daniel., MD  valproic acid (DEPAKENE) 250 MG capsule Take 750 mg by mouth every evening.   Yes [provider]   Scheduled Meds:  baclofen  10 mg Per Tube TID   Chlorhexidine Gluconate Cloth  6 each Topical PC lunch   enoxaparin (LOVENOX) injection  40 mg Subcutaneous Q24H   feeding supplement (PROSource TF20)  60 mL Per Tube Daily   free water  30 mL Per Tube Q4H   gabapentin  300 mg Per Tube Q8H   lacosamide  100 mg Per Tube BID   midodrine   10 mg Per Tube TID WC   nutrition supplement (JUVEN)  1 packet Per Tube BID BM   mouth rinse  15 mL Mouth Rinse Q2H   oxyCODONE  5 mg Per Tube Q6H   pantoprazole (PROTONIX) IV  40 mg Intravenous Q12H   propranolol  10 mg Per Tube Q8H   QUEtiapine  50 mg Per Tube TID   sodium chloride flush  10-40 mL Intracatheter Q12H   valproic acid  500 mg Per Tube Daily   valproic acid  750 mg Per Tube QHS   Continuous Infusions:  feeding supplement (OSMOLITE 1.5 CAL) 1,000 mL (12/15/23 0705)   PRN Meds:.acetaminophen **OR** acetaminophen, bisacodyl, glycopyrrolate, levalbuterol, lip balm, ondansetron **OR** ondansetron (ZOFRAN) IV, mouth rinse, polyethylene glycol, senna, sodium chloride flush   Critical care time: 40 minutes   Katalina Magri S. Tukov ANP-BC Pulmonary and Critical Care Medicine [facility]  NB: This document was prepared using Dragon voice recognition software and may include unintentional dictation errors.

## 2023-12-15 NOTE — Plan of Care (Signed)
  Problem: Fluid Volume: Goal: Hemodynamic stability will improve Outcome: Progressing   Problem: Clinical Measurements: Goal: Diagnostic test results will improve Outcome: Progressing Goal: Signs and symptoms of infection will decrease Outcome: Progressing   Problem: Respiratory: Goal: Ability to maintain adequate ventilation will improve Outcome: Progressing   Problem: Respiratory: Goal: Ability to maintain a clear airway and adequate ventilation will improve Outcome: Progressing   Problem: Role Relationship: Goal: Method of communication will improve Outcome: Progressing   Problem: Clinical Measurements: Goal: Diagnostic test results will improve Outcome: Progressing Goal: Respiratory complications will improve Outcome: Progressing Goal: Cardiovascular complication will be avoided Outcome: Progressing   Problem: Nutrition: Goal: Adequate nutrition will be maintained Outcome: Progressing   Problem: Elimination: Goal: Will not experience complications related to bowel motility Outcome: Progressing Goal: Will not experience complications related to urinary retention Outcome: Progressing   Problem: Safety: Goal: Ability to remain free from injury will improve Outcome: Progressing

## 2023-12-16 DIAGNOSIS — J690 Pneumonitis due to inhalation of food and vomit: Secondary | ICD-10-CM | POA: Diagnosis not present

## 2023-12-16 DIAGNOSIS — A419 Sepsis, unspecified organism: Secondary | ICD-10-CM | POA: Diagnosis not present

## 2023-12-16 DIAGNOSIS — R000 Tachycardia, unspecified: Secondary | ICD-10-CM | POA: Diagnosis not present

## 2023-12-16 DIAGNOSIS — J9601 Acute respiratory failure with hypoxia: Secondary | ICD-10-CM | POA: Diagnosis not present

## 2023-12-16 LAB — CBC
HCT: 31.7 % — ABNORMAL LOW (ref 39.0–52.0)
Hemoglobin: 10.1 g/dL — ABNORMAL LOW (ref 13.0–17.0)
MCH: 31.2 pg (ref 26.0–34.0)
MCHC: 31.9 g/dL (ref 30.0–36.0)
MCV: 97.8 fL (ref 80.0–100.0)
Platelets: 382 10*3/uL (ref 150–400)
RBC: 3.24 MIL/uL — ABNORMAL LOW (ref 4.22–5.81)
RDW: 13.5 % (ref 11.5–15.5)
WBC: 4.7 10*3/uL (ref 4.0–10.5)
nRBC: 0 % (ref 0.0–0.2)

## 2023-12-16 LAB — RENAL FUNCTION PANEL
Albumin: 2.8 g/dL — ABNORMAL LOW (ref 3.5–5.0)
Anion gap: 11 (ref 5–15)
BUN: 21 mg/dL — ABNORMAL HIGH (ref 6–20)
CO2: 26 mmol/L (ref 22–32)
Calcium: 9 mg/dL (ref 8.9–10.3)
Chloride: 101 mmol/L (ref 98–111)
Creatinine, Ser: 0.41 mg/dL — ABNORMAL LOW (ref 0.61–1.24)
GFR, Estimated: >60 mL/min (ref >60)
Glucose, Bld: 97 mg/dL (ref 70–99)
Phosphorus: 4.2 mg/dL (ref 2.5–4.6)
Potassium: 4.1 mmol/L (ref 3.5–5.1)
Sodium: 138 mmol/L (ref 135–145)

## 2023-12-16 LAB — GLUCOSE, CAPILLARY
Glucose-Capillary: 101 mg/dL — ABNORMAL HIGH (ref 70–99)
Glucose-Capillary: 104 mg/dL — ABNORMAL HIGH (ref 70–99)
Glucose-Capillary: 110 mg/dL — ABNORMAL HIGH (ref 70–99)
Glucose-Capillary: 118 mg/dL — ABNORMAL HIGH (ref 70–99)
Glucose-Capillary: 129 mg/dL — ABNORMAL HIGH (ref 70–99)
Glucose-Capillary: 90 mg/dL (ref 70–99)

## 2023-12-16 MED ORDER — ZINC OXIDE 40 % EX OINT
TOPICAL_OINTMENT | Freq: Four times a day (QID) | CUTANEOUS | Status: DC | PRN
Start: 2023-12-16 — End: 2024-01-27
  Filled 2023-12-16 (×4): qty 113

## 2023-12-16 NOTE — Plan of Care (Signed)
  Problem: Fluid Volume: Goal: Hemodynamic stability will improve Outcome: Progressing   Problem: Clinical Measurements: Goal: Diagnostic test results will improve Outcome: Progressing Goal: Signs and symptoms of infection will decrease Outcome: Progressing   Problem: Respiratory: Goal: Ability to maintain adequate ventilation will improve Outcome: Progressing   Problem: Respiratory: Goal: Ability to maintain a clear airway and adequate ventilation will improve Outcome: Progressing   Problem: Role Relationship: Goal: Method of communication will improve Outcome: Progressing   Problem: Clinical Measurements: Goal: Ability to maintain clinical measurements within normal limits will improve Outcome: Progressing Goal: Will remain free from infection Outcome: Progressing Goal: Diagnostic test results will improve Outcome: Progressing Goal: Respiratory complications will improve Outcome: Progressing Goal: Cardiovascular complication will be avoided Outcome: Progressing   Problem: Nutrition: Goal: Adequate nutrition will be maintained Outcome: Progressing   Problem: Coping: Goal: Level of anxiety will decrease Outcome: Progressing   Problem: Elimination: Goal: Will not experience complications related to bowel motility Outcome: Progressing Goal: Will not experience complications related to urinary retention Outcome: Progressing   Problem: Pain Managment: Goal: General experience of comfort will improve and/or be controlled Outcome: Progressing   Problem: Safety: Goal: Ability to remain free from injury will improve Outcome: Progressing   Problem: Skin Integrity: Goal: Risk for impaired skin integrity will decrease Outcome: Progressing
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
## 2023-12-16 NOTE — TOC Progression Note (Signed)
 Transition of Care [facility]) - Progression Note    Patient Details  Name: Alex [name] MRN: [id number] Date of Birth: [birth year]  [facility] CM/SW Contact  [name], LCSW Phone Number: 12/16/2023, 10:17 AM  Clinical Narrative:    Spoke with RN about plan for patient. TOC will continue to follow. Patient on trach (not first time getting a trach) and vent at this time.    Expected Discharge Plan: Skilled Nursing Facility Barriers to Discharge: Continued Medical Work up  Expected Discharge Plan and Services       Living arrangements for the past 2 months: Skilled Nursing Facility                                       Social Determinants of Health (SDOH) Interventions SDOH Screenings   Food Insecurity: Patient Unable To Answer (11/20/2023)  Housing: Patient Unable To Answer (11/20/2023)  Transportation Needs: Patient Unable To Answer (11/20/2023)  Utilities: Patient Unable To Answer (11/20/2023)  Financial Resource Strain: Low Risk  (12/02/2021)   Received from [facility]  Tobacco Use: Low Risk  (11/19/2023)    Readmission Risk Interventions     No data to display

## 2023-12-16 NOTE — Progress Notes (Signed)
 NAME:  Alex [name], MRN:  [id number], DOB:  10/01/1996, LOS: 27 ADMISSION DATE:  11/19/2023, CONSULTATION DATE:  11/20/23 REFERRING MD:  Dr. Arville Care, CHIEF COMPLAINT:  AMS   Brief Pt Description / Synopsis:  28 y.o. male with PMHx significant for chronic TBI with left sided hemiparesis admitted with Acute Metabolic Encephalopathy and Acute Hypoxic Respiratory Failure in the setting of aspiration and Pseudomonas and MRSA Pneumonia requiring intubation and mechanical ventilation. With recurrent aspiration events, required Tracheostomy placement by ENT on 12/07/23.  History of Present Illness:  28 yo M presenting to [facility] ED from outpatient rehab on 11/19/23 for evaluation of altered mental status.   History obtained per chart review and mother's telephone interview, patient unable to participate in interview due to respiratory distress and baseline TBI. 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 He was treated at [facility] for 5 months then discharged to rehab. Per his mother and legal guardian his baseline is: Non verbal but he will yell out sporadic nonsensical words with left sided paralysis. He is able to move his RUE in order to feed himself with finger foods and he has some involuntary movement with that arm, he will swat at you. Similar baseline function with RLE, he can kick and move, but often will lay it bent and to the side. He has enough strength to even try and get out of bed on the right side. She denies any issues with swallowing, but eats mostly soft food. If he is not watched closely with eating he will try to eat all the food at once. The facility staff reported he was at his normal baseline until lunchtime on 11/19/23. It was observed he was more somnolent and not interactive. Staff noted a cough and slightly increased work of breathing. Mom also confirmed noting a congested cough the last time she visited earlier in the week. Staff and mom denied nausea/ vomiting,  but mom reports chronic loose stools.   EMS reported the patient febrile and tachycardic on arrival.   ED course: Upon arrival patient tachycardic and lethargic. Sepsis protocol initiated with antibiotics and IVF resuscitation. Labs significant for mild hypokalemia, otherwise WNL. Initial imaging unremarkable but then patient became hypoxic with SpO2 85% on RA and a CTa was obtained, negative for PE but concerning for aspiration pneumonia.  Medications given: Cefepime, Flagyl, Vancomycin, 2 L IVF bolus, IV contrast Initial Vitals: 97.9, 17, 130, 119/82 & 92 on RA Significant labs: (Labs/ Imaging personally reviewed) EKG : pending Chemistry: Na+: 138, K+: 3.4, BUN/Cr.: 13/ 0.65, Serum CO2/ AG: 26/8 Hematology: WBC: 4.3, Hgb: 14.3,  Lactic/ PCT: 1.1 > 1.4 / pending, COVID-19 & Influenza A/B: negative   CXR 11/19/23: no active disease CT head wo contrast 11/19/23: No evidence of acute intracranial abnormality. Stable multifocal encephalomalacia, likely the sequela of prior trauma. Similar age-advanced cerebral atrophy CT angio chest PE 11/19/23:  No pulmonary embolus. Near complete filling of the right mainstem bronchus with filling extending into the bronchus intermedius, right upper, middle and lower lobe bronchi. Layering debris in the trachea. Findings are highly suspicious for aspiration. Minimal ill-defined patchy and nodular airspace disease in the dependent right lower lobe, likely postobstructive pneumonia.   Disposition: Admitted to [facility] service. While holding in the ED, respiratory status worsened and due to high risk for decompensation PCCM consulted and patient intubated for airway protection.   Please see "Significant Hospital Events" section below for full detailed hospital course.  Pertinent  Medical History  Seizures TBI (09/2019)  Micro Data:  1/27: SARS-CoV-2/flu/RSV PCR>> negative 1/27: Blood culture x 2>> no growth 1/28: MRSA PCR>>Positive  1/28: Tracheal  aspirate>>Pseudomonas aeruginosa and Staphylococcus aureus 1/28: HIV screen>> nonreactive 1/29: BAL>>Pseudomonas aeruginosa & MRSA 2/07: Cdiff>>negative  2/15: Blood x2>>NGTD  2/16: Tracheal aspirate>>Pseudomonas aeruginosa 2/16: MRSA PCR +  Antimicrobials:   Anti-infectives (From admission, onward)    Start     Dose/Rate Route Frequency Ordered Stop   12/10/23 1100  linezolid (ZYVOX) tablet 600 mg  Status:  Discontinued        600 mg Per Tube Every 12 hours 12/10/23 1014 12/12/23 1126   12/08/23 1915  vancomycin (VANCOREADY) IVPB 1250 mg/250 mL  Status:  Discontinued        1,250 mg 166.7 mL/hr over 90 Minutes Intravenous 2 times daily 12/08/23 1821 12/10/23 1013   12/08/23 1915  piperacillin-tazobactam (ZOSYN) IVPB 3.375 g  Status:  Discontinued        3.375 g 12.5 mL/hr over 240 Minutes Intravenous Every 8 hours 12/08/23 1821 12/12/23 1105   12/05/23 1443  ceFAZolin (ANCEF) IVPB 1 g/50 mL premix        over 30 Minutes  Continuous PRN 12/05/23 1443 12/05/23 1443   12/05/23 0000  ceFAZolin (ANCEF) IVPB 2g/100 mL premix        2 g 200 mL/hr over 30 Minutes Intravenous To Radiology 12/04/23 1324 12/05/23 0041   12/01/23 2200  vancomycin (VANCOREADY) IVPB 750 mg/150 mL  Status:  Discontinued        750 mg 150 mL/hr over 60 Minutes Intravenous Every 8 hours 12/01/23 1205 12/01/23 1215   12/01/23 1400  vancomycin (VANCOREADY) IVPB 1250 mg/250 mL  Status:  Discontinued        1,250 mg 166.7 mL/hr over 90 Minutes Intravenous  Once 12/01/23 1205 12/01/23 1215   12/01/23 1315  linezolid (ZYVOX) IVPB 600 mg        600 mg 300 mL/hr over 60 Minutes Intravenous Every 12 hours 12/01/23 1215 12/07/23 2253   12/01/23 0230  piperacillin-tazobactam (ZOSYN) IVPB 3.375 g        3.375 g 12.5 mL/hr over 240 Minutes Intravenous Every 8 hours 12/01/23 0131 12/07/23 1944   11/30/23 1500  levofloxacin (LEVAQUIN) IVPB 750 mg  Status:  Discontinued        750 mg 100 mL/hr over 90 Minutes Intravenous  Every 24 hours 11/30/23 0345 12/01/23 0048   11/28/23 0100  vancomycin (VANCOREADY) IVPB 1250 mg/250 mL       Placed in "Followed by" Linked Group   1,250 mg 166.7 mL/hr over 90 Minutes Intravenous Every 12 hours 11/27/23 1153 11/29/23 1331   11/27/23 1300  vancomycin (VANCOREADY) IVPB 1500 mg/300 mL       Placed in "Followed by" Linked Group   1,500 mg 150 mL/hr over 120 Minutes Intravenous  Once 11/27/23 1153 11/27/23 1516   11/23/23 1500  levofloxacin (LEVAQUIN) IVPB 750 mg        750 mg 100 mL/hr over 90 Minutes Intravenous Every 24 hours 11/23/23 1341 11/29/23 1551   11/21/23 2200  doxycycline (VIBRAMYCIN) 100 mg in sodium chloride 0.9 % 250 mL IVPB  Status:  Discontinued        100 mg 125 mL/hr over 120 Minutes Intravenous Every 12 hours 11/21/23 1425 11/27/23 1148   11/21/23 2000  metroNIDAZOLE (FLAGYL) IVPB 500 mg  Status:  Discontinued        500 mg 100 mL/hr over  60 Minutes Intravenous Every 12 hours 11/21/23 1425 11/24/23 1227   11/20/23 1200  Ampicillin-Sulbactam (UNASYN) 3 g in sodium chloride 0.9 % 100 mL IVPB  Status:  Discontinued        3 g 200 mL/hr over 30 Minutes Intravenous Every 6 hours 11/20/23 1131 11/21/23 1423   11/20/23 0800  cefTRIAXone (ROCEPHIN) 2 g in sodium chloride 0.9 % 100 mL IVPB  Status:  Discontinued        2 g 200 mL/hr over 30 Minutes Intravenous Every 24 hours 11/19/23 2322 11/20/23 0229   11/20/23 0100  azithromycin (ZITHROMAX) 500 mg in sodium chloride 0.9 % 250 mL IVPB  Status:  Discontinued        500 mg 250 mL/hr over 60 Minutes Intravenous Every 24 hours 11/19/23 2322 11/21/23 1423   11/19/23 1545  ceFEPIme (MAXIPIME) 2 g in sodium chloride 0.9 % 100 mL IVPB        2 g 200 mL/hr over 30 Minutes Intravenous  Once 11/19/23 1541 11/19/23 1630   11/19/23 1545  metroNIDAZOLE (FLAGYL) IVPB 500 mg        500 mg 100 mL/hr over 60 Minutes Intravenous  Once 11/19/23 1541 11/19/23 1748   11/19/23 1545  vancomycin (VANCOCIN) IVPB 1000 mg/200 mL  premix        1,000 mg 200 mL/hr over 60 Minutes Intravenous  Once 11/19/23 1541 11/19/23 1917      Significant Hospital Events: Including procedures, antibiotic start and stop dates in addition to other pertinent events   11/20/23: Admit to ICU due to acute hypoxic respiratory failure requiring urgent intubation and mechanical ventilatory support secondary to suspected aspiration and pneumonia 11/21/23- patient moving RUE, mother at bedside we reviewed medical plan. He remains on 50mcg/kg/hr levophed, today plan to rescusitate more aggresively and wean from levophed and potentially extubate post SBP.   He is febrile this am.  He is on zithromax, unasyn 11/22/23- s/p bronch yesterday with aspiration of mucus plugging.  Today resp status improved with liberation protocol in proces. SLP post extubation today. 11/23/23- patient is +for pseudomonas resp cultures.  He is also MRSA pcr +, refined therapy during rounds with pharmacist. He remains on MV 11/24/23- patient is still on vasopressor support weaning down on MV.  Secretions are slightly better.  11/25/23- patient weaned off levophed, failed SBT with tachypnea, tachycardia.  Secretions much improved. 11/26/23- on minimal vent support, unable to perform SBT due to copious secretions from ETT.   11/27/23- on minimal vent support, secretions improved.  Change Doxycycline to Vancomycin.  Plan for SBT as tolerated. 11/28/23-Pt successfully extubated 02/4, currently tolerating HHFNC @35L /35%.  Required low dose precedex overnight to allow suctioning due to excessive secretions. Pt with suspected seizure activity developed tremors in the right upper extremity and became minimally responsive.  Received 1g of iv keppra.    11/29/23-Overnight pt developed sinus tachycardia/svt 140 to 160's improved following 2.5 mg iv metoprolol.  Tolerating RA with no signs of respiratory distress.  Transferring to progressive care unit TRH to pick on 02/7 12/01/23: Pt transferred back to  ICU with severe acute hypoxic respiratory failure initially placed on HHFNC but due to significant hypoxia O2 sats in the 60's he required reintubation and underwent emergent bronchoscopy which revealed thick secretions in the LUL and LLL resulting in mucous plugging. Therapeutic aspiration performed. CXR showed collapse of the LUL secondary to mucus plugging  12/03/23: Pt remains mechanically intubated on minimal vent settings.  ENT consulted for tracheostomy  placement due to recurrent respiratory failure due to inability to clear secretions.  Requiring levophed gtt to maintain map 65 or higher  12/04/23: No acute events overnight on minimal settings pending tracheostomy placement  12/05/23: No acute events overnight, on minimal vent support. Initially on low dose Levophed, now weaned off.  IR placed PEG tube. Pending Tracheostomy placement on Friday. 12/06/23: No acute events overnight, on minimal vent support. Awaiting Tracheostomy placement tomorrow. 12/07/23: No acute events overnight.  Tracheostomy placed this morning by ENT, no events with procedure. Will keep sedated today with newly placed Trach with plan for WUA/SBT tomorrow. 12/08/23: All sedation turned off for WUA will perform SBT or TCT as tolerated.  Pt spike at temp 100.9 F, hypotensive requiring levophed gtt, and tachycardic concerning for possible sepsis.  Blood culture/tracheal aspirate and UA sent.  Restarted broad spectrum abx (vancomycin/zosyn) 12/09/23: Pt no longer requiring levophed gtt.  Tmax 102.4 F 12/10/23: Central Line Removed, not requiring pressors or sedation. Tolearting TC. 12/11/23: Overnight was started back on Gabapentin and scheduled Propanol for possible neurostorming with significant improvement in HR from the 170's to mid 100's 12/12/23: HR remains stable in low 100's, not requiring vasopressors, on minimal vent support, SBT as tolerated.  Complete course of ABX as suspect he is colonized with Pseudomonas and MRSA. 12/13/23:  Overnight with concern for possible tube feeds in ETT tubing. Propanol was decreased due to soft BP. CXR this morning without new infiltrate, no leukocytosis or fever, and bowel pattern normal on KUB, will restart tube feeds. Remains on minimal vent support, execise in PSV as tolerated ~ unable to tolerate less than 10/5 in PSV. 12/14/23: No significant events overnight.  Afebrile, hemodynamically stable.  On minimal vent support, continue with SBT/TCT as tolerated. 12/15/23: No change in patient's condition.  Remained on full vent support overnight.  Now on pressure support and tolerating. 12/16/23: Tolerated trach collar trials  Interim History / Subjective:  Afebrile and hemodynamically stable overnight continues to require full vent support overnight.  Tolerated trach collar trials during the day.  No other acute changes Objective   Blood pressure 113/77, pulse (!) 115, temperature 98.6 F (37 C), temperature source Axillary, resp. rate 17, height 5' 9.02" (1.753 m), weight 65.6 kg, SpO2 99%.    Vent Mode: PRVC FiO2 (%):  [28 %-30 %] 30 % Set Rate:  [15 bmp] 15 bmp Vt Set:  [450 mL] 450 mL PEEP:  [5 cmH20] 5 cmH20 Plateau Pressure:  [14 cmH20] 14 cmH20   Intake/Output Summary (Last 24 hours) at 12/16/2023 1808 Last data filed at 12/16/2023 1655 Gross per 24 hour  Intake 1107 ml  Output 750 ml  Net 357 ml   Filed Weights   12/14/23 0500 12/15/23 0705 12/16/23 0500  Weight: 61.1 kg 62 kg 65.6 kg   Examination: General: Acute on chronically-ill appearing frail male, NAD, receiving mechanical ventilation via Tracheostomy HENT: Atraumatic, normocephalic, neck supple, no JVD, Tracheostomy in place clean, dry, and intact Lungs: Mechanical breath sound throughout, even, non labored, normal effort Cardiovascular: Sinus tachycardia, s1s2, no m/r/g, 2+ radial/2+ distal pulses, trace generalized edema  Abdomen: +BS x4, soft, non distended, PEG tube in place clean, dry, and intact Extremities:  Left upper and lower extremities contractured, but able to move right upper extremity, bilateral foot drop Neuro: Awake, Left-sided hemiparesis with left arm contractured (baseline), opens eyes to voice and tracks, moves right extremities purposefully but not to command GU: External male catheter in place  Resolved Hospital Problem  list     Assessment & Plan:   #Sinus tachycardia/SVT  #Hypotension secondary to sedating medication and possible recurrent sepsis  -Heart rate improved with heart rate staying between 101 115 bpm -Continuous telemetry monitoring - Continue scheduled propanolol 10 mg q8h for possible neurostorming  - IV fluid resuscitation prn and/or vasopressors to maintain map 65 or higher - Continue Midodrine   #Acute hypoxic respiratory failure secondary to aspiration, LUL/LLL mucous plugging, and collapse of the LUL due to mucous plugging in the setting of TBI s/p emergent bronchoscopy 02/8 #Mechanical ventilation  Status post Tracheostomy placement with ENT on 12/06/22 -Full vent support, implement lung protective strategies as needed -Pressure support trials with PEEP of 5 and rate of rate 10 and trach collar trials as tolerated. -Plan is to keep him on trach collar overnight if tolerated.  Okay -FiO2 down to 28%; titrate FiO2 to maintain SpO2 greater than 90% -Chest x-ray and ABG as needed  -Spontaneous Breathing Trials / Trach collar trials as tolerated -Implement VAP Bundle -Prn Bronchodilators -Plan is for patient to be placed in LTAC  #Pseudomonas & MRSA pneumonia~ TREATED, SUSPECT HE IS COLONIZED #Aspiration pneumonia~ TREATED #Possible recurrent sepsis  - Trend WBC and monitor fever curve  - Follow cultures  - Completed course of Linezolid and zosyn 2/19; remains afebrile  #Chronic Seizure Disorder #Chronic TBI #Sedation needs in setting of mechanical ventilation EEG 02/10: obtained while sedated on propofol and comatose and is abnormal due to severe  diffuse slowing indicative of global cerebral dysfunction, medication effect, or both. Epileptiform abnormalities were not seen during this recording.  - Maintain a RASS goal of 0  - scheduled oxycodone per tube 02/15  - Avoid sedating medications as able - Daily wake up assessment - Continue outpatient vimpat and valproic acid  - Continue outpatient baclofen , seroquel and Gabapentin - Seizure precautions   #Dysphagia - TF's via G-tube    Best Practice (right click and "Reselect all SmartList Selections" daily)   Diet/type: TF's  DVT prophylaxis: LMWH  GI prophylaxis: PPI Lines: N/A Foley:  N/A Code Status:  full code Last date of multidisciplinary goals of care discussion [12/14/23]  2/22: No family at bedside.  Will update when available  Labs   CBC: Recent Labs  Lab 12/10/23 0515 12/11/23 0335 12/12/23 0839 12/13/23 0419 12/14/23 0629 12/15/23 0237 12/16/23 0414  WBC 7.2   < > 6.4 5.0 5.7 6.3 4.7  NEUTROABS 4.7  --   --   --   --   --   --   HGB 10.0*   < > 11.3* 10.1* 10.7* 10.6* 10.1*  HCT 31.1*   < > 34.8* 31.6* 33.6* 33.1* 31.7*  MCV 96.9   < > 96.7 97.2 97.4 96.5 97.8  PLT 272   < > 360 306 365 387 382   < > = values in this interval not displayed.   Basic Metabolic Panel: Recent Labs  Lab 12/10/23 0515 12/11/23 0335 12/12/23 0839 12/13/23 0419 12/14/23 0629 12/15/23 0237 12/16/23 0414  NA  --  138 139 138 141 139 138  K  --  3.6 4.9 4.2 4.5 4.2 4.1  CL  --  104 106 105 106 101 101  CO2  --  23 23 25 26 26 26   GLUCOSE  --  139* 94 113* 87 104* 97  BUN  --  16 16 11 11 19  21*  CREATININE  --  0.47* 0.54* 0.48* 0.42* 0.44* 0.41*  CALCIUM  --  8.7* 8.9 8.7* 8.8* 9.1 9.0  MG 2.1 2.1 2.2 2.0 2.1  --   --   PHOS  --  3.3 3.0 3.0 4.2 4.4 4.2   GFR: Estimated Creatinine Clearance: 127.6 mL/min (A) (by C-G formula based on SCr of 0.41 mg/dL (L)). Recent Labs  Lab 12/13/23 0419 12/14/23 0629 12/15/23 0237 12/16/23 0414  WBC 5.0 5.7 6.3 4.7    Liver Function Tests: Recent Labs  Lab 12/11/23 0335 12/12/23 0839 12/13/23 0419 12/14/23 0629 12/15/23 0237 12/16/23 0414  AST 12*  --   --   --   --   --   ALT 8  --   --   --   --   --   ALKPHOS 67  --   --   --   --   --   BILITOT 0.5  --   --   --   --   --   PROT 6.3*  --   --   --   --   --   ALBUMIN 2.3* 2.4* 2.8* 2.8* 3.1* 2.8*   No results for input(s): "LIPASE", "AMYLASE" in the last 168 hours. No results for input(s): "AMMONIA" in the last 168 hours.  ABG    Component Value Date/Time   PHART 7.44 12/01/2023 1143   PCO2ART 34 12/01/2023 1143   PO2ART 63 (L) 12/01/2023 1143   HCO3 23.1 12/01/2023 1143   TCO2 28 05/31/2021 0404   ACIDBASEDEF 0.5 12/01/2023 1143   O2SAT 93 12/01/2023 1143   Coagulation Profile: No results for input(s): "INR", "PROTIME" in the last 168 hours.  Cardiac Enzymes: No results for input(s): "CKTOTAL", "CKMB", "CKMBINDEX", "TROPONINI" in the last 168 hours.  HbA1C: Hgb A1c MFr Bld  Date/Time Value Ref Range Status  04/11/2022 06:10 AM 5.1 4.8 - 5.6 % Final    Comment:    (NOTE) Pre diabetes:          5.7%-6.4%  Diabetes:              >6.4%  Glycemic control for   <7.0% adults with diabetes   05/31/2021 04:18 PM 4.9 4.8 - 5.6 % Final    Comment:    (NOTE) Pre diabetes:          5.7%-6.4%  Diabetes:              >6.4%  Glycemic control for   <7.0% adults with diabetes    CBG: Recent Labs  Lab 12/16/23 0002 12/16/23 0629 12/16/23 0749 12/16/23 1225 12/16/23 1617  GLUCAP 104* 118* 129* 90 110*    Review of Systems:   Unable to assess pt nonverbal and Tracheostomy in place  Past Medical History:  He,  has a past medical history of 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   Surgical History:   Past Surgical History:  Procedure Laterality Date   GASTROSTOMY TUBE PLACEMENT     IR GASTROSTOMY TUBE MOD SED   12/05/2023   TRACHEOSTOMY TUBE PLACEMENT N/A 12/07/2023   Procedure: TRACHEOSTOMY;  Surgeon: Lanell Persons, MD;  Location: [facility] ORS;  Service: ENT;  Laterality: N/A;     Social History:   reports that he has never smoked. He has never used smokeless tobacco. He reports that he does not currently use alcohol. He reports that he does not use drugs.   Family History:  His family history is not on file.   Allergies No Known  Allergies   Home Medications  Prior to Admission medications   Medication Sig Start Date End Date Taking? Authorizing Provider  baclofen (LIORESAL) 10 MG tablet Take 10 mg by mouth 3 (three) times daily.   Yes [provider]  diazePAM, 15 MG Dose, (VALTOCO 15 MG DOSE) 2 x 7.5 MG/0.1ML LQPK Spray 7.5 mg into each nostril in the event of a seizure 04/20/22  Yes Zigmund Daniel., MD  gabapentin (NEURONTIN) 400 MG capsule Take 1 capsule (400 mg total) by mouth 3 (three) times daily. 04/20/22 11/20/23 Yes Zigmund Daniel., MD  lacosamide 100 MG TABS Take 1 tablet (100 mg total) by mouth 2 (two) times daily. 04/20/22 11/20/23 Yes Zigmund Daniel., MD  melatonin 5 MG TABS Take 10 mg by mouth at bedtime.   Yes [provider]  QUEtiapine (SEROQUEL XR) 200 MG 24 hr tablet Take 200 mg by mouth in the morning.   Yes [provider]  QUEtiapine (SEROQUEL) 25 MG tablet Take 25 mg by mouth at bedtime.   Yes [provider]  valproic acid (DEPAKENE) 250 MG capsule Take 4 capsules (1,000 mg total) by mouth 2 (two) times daily. Patient taking differently: Take 500 mg by mouth every morning. 04/20/22 11/20/23 Yes Zigmund Daniel., MD  valproic acid (DEPAKENE) 250 MG capsule Take 750 mg by mouth every evening.   Yes [provider]   Scheduled Meds:  baclofen  10 mg Per Tube TID   Chlorhexidine Gluconate Cloth  6 each Topical PC lunch   enoxaparin (LOVENOX) injection  40 mg Subcutaneous Q24H   feeding supplement (PROSource  TF20)  60 mL Per Tube Daily   free water  30 mL Per Tube Q4H   gabapentin  300 mg Per Tube Q8H   lacosamide  100 mg Per Tube BID   midodrine  10 mg Per Tube TID WC   nutrition supplement (JUVEN)  1 packet Per Tube BID BM   mouth rinse  15 mL Mouth Rinse Q2H   oxyCODONE  5 mg Per Tube Q6H   pantoprazole (PROTONIX) IV  40 mg Intravenous Q12H   propranolol  10 mg Per Tube Q8H   QUEtiapine  50 mg Per Tube TID   sodium chloride flush  10-40 mL Intracatheter Q12H   valproic acid  500 mg Per Tube Daily   valproic acid  750 mg Per Tube QHS   Continuous Infusions:  feeding supplement (OSMOLITE 1.5 CAL) 60 mL/hr at 12/16/23 1655   PRN Meds:.acetaminophen **OR** acetaminophen, bisacodyl, glycopyrrolate, levalbuterol, lip balm, ondansetron **OR** ondansetron (ZOFRAN) IV, mouth rinse, polyethylene glycol, senna, sodium chloride flush   Critical care time: 40 minutes   Liban Guedes S. Tukov ANP-BC Pulmonary and Critical Care Medicine [facility]  NB: This document was prepared using Dragon voice recognition software and may include unintentional dictation errors.

## 2023-12-17 ENCOUNTER — Ambulatory Visit: Payer: Medicaid Other

## 2023-12-17 DIAGNOSIS — R569 Unspecified convulsions: Secondary | ICD-10-CM | POA: Diagnosis not present

## 2023-12-17 DIAGNOSIS — R4182 Altered mental status, unspecified: Secondary | ICD-10-CM

## 2023-12-17 LAB — GLUCOSE, CAPILLARY
Glucose-Capillary: 120 mg/dL — ABNORMAL HIGH (ref 70–99)
Glucose-Capillary: 85 mg/dL (ref 70–99)

## 2023-12-17 NOTE — Procedures (Signed)
 Patient Name: Alex [name]  MRN: [id number]  Epilepsy Attending: Charlsie Quest  Referring Physician/Provider: Ezequiel Essex, NP  Date: 12/17/2023 Duration: 29.49 mins  Patient history: 28yo M with ams. EEG to evaluate for seizure  Level of alertness: Awake  AEDs during EEG study: GBP, LCM, VPA  Technical aspects: This EEG study was done with scalp electrodes positioned according to the 10-20 International system of electrode placement. Electrical activity was reviewed with band pass filter of 1-70Hz , sensitivity of 7 uV/mm, display speed of 50mm/sec with a 60Hz  notched filter applied as appropriate. EEG data were recorded continuously and digitally stored.  Video monitoring was available and reviewed as appropriate.  Description: No posterior dominant rhythm was seen. EEG showed continuous generalized 3 to 5 Hz theta-delta slowing. Hyperventilation and photic stimulation were not performed.     ABNORMALITY - Continuous slow, generalized  IMPRESSION: This study is suggestive of moderate diffuse encephalopathy. No seizures or epileptiform discharges were seen throughout the recording.  Jeptha Hinnenkamp Annabelle Harman
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
## 2023-12-17 NOTE — TOC Progression Note (Signed)
 Transition of Care [facility]) - Progression Note    Patient Details  Name: Alex [name] MRN: [id number] Date of Birth: Nov 18, 1995  [facility] CM/SW Contact  Garret Reddish, RN Phone Number: 12/17/2023, 2:51 PM  Clinical Narrative:    Chart reviewed.  I have spoken to [place], Admission Coordinator with [facility].  S[facility] Battiest informs me that that they do take Medicaid as a payor.  Suzette Battiest has requested that I fax over H and P. Updated Physician notes, meds, PT notes, and Respiratory notes.  [place]'s contact number is [phone].  I have faxed above information to [phone].    TOC will continue to follow for discharge planning.     Expected Discharge Plan: Skilled Nursing Facility Barriers to Discharge: Continued Medical Work up  Expected Discharge Plan and Services       Living arrangements for the past 2 months: Skilled Nursing Facility                                       Social Determinants of Health (SDOH) Interventions SDOH Screenings   Food Insecurity: Patient Unable To Answer (11/20/2023)  Housing: Patient Unable To Answer (11/20/2023)  Transportation Needs: Patient Unable To Answer (11/20/2023)  Utilities: Patient Unable To Answer (11/20/2023)  Financial Resource Strain: Low Risk  (12/02/2021)   Received from [facility]  Tobacco Use: Low Risk  (11/19/2023)    Readmission Risk Interventions     No data to display

## 2023-12-17 NOTE — Progress Notes (Signed)
 Eeg done

## 2023-12-17 NOTE — Progress Notes (Signed)
 NAME:  Alex [name], MRN:  [id number], DOB:  [birth year], LOS: 28 ADMISSION DATE:  11/19/2023, CONSULTATION DATE:  11/20/23 REFERRING MD:  Dr. Arville Care, CHIEF COMPLAINT:  AMS   Brief Pt Description / Synopsis:  28 y.o. male with PMHx significant for chronic TBI with left sided hemiparesis admitted with Acute Metabolic Encephalopathy and Acute Hypoxic Respiratory Failure in the setting of aspiration and Pseudomonas and MRSA Pneumonia requiring intubation and mechanical ventilation. With recurrent aspiration events, required Tracheostomy placement by ENT on 12/07/23.  History of Present Illness:  28 yo M presenting to [facility] ED from outpatient rehab on 11/19/23 for evaluation of altered mental status.   History obtained per chart review and mother's telephone interview, patient unable to participate in interview due to respiratory distress and baseline TBI. 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 He was treated at [facility] for 5 months then discharged to rehab. Per his mother and legal guardian his baseline is: Non verbal but he will yell out sporadic nonsensical words with left sided paralysis. He is able to move his RUE in order to feed himself with finger foods and he has some involuntary movement with that arm, he will swat at you. Similar baseline function with RLE, he can kick and move, but often will lay it bent and to the side. He has enough strength to even try and get out of bed on the right side. She denies any issues with swallowing, but eats mostly soft food. If he is not watched closely with eating he will try to eat all the food at once. The facility staff reported he was at his normal baseline until lunchtime on 11/19/23. It was observed he was more somnolent and not interactive. Staff noted a cough and slightly increased work of breathing. Mom also confirmed noting a congested cough the last time she visited earlier in the week. Staff and mom denied nausea/ vomiting,  but mom reports chronic loose stools.   EMS reported the patient febrile and tachycardic on arrival.   ED course: Upon arrival patient tachycardic and lethargic. Sepsis protocol initiated with antibiotics and IVF resuscitation. Labs significant for mild hypokalemia, otherwise WNL. Initial imaging unremarkable but then patient became hypoxic with SpO2 85% on RA and a CTa was obtained, negative for PE but concerning for aspiration pneumonia.  Medications given: Cefepime, Flagyl, Vancomycin, 2 L IVF bolus, IV contrast Initial Vitals: 97.9, 17, 130, 119/82 & 92 on RA Significant labs: (Labs/ Imaging personally reviewed) EKG : pending Chemistry: Na+: 138, K+: 3.4, BUN/Cr.: 13/ 0.65, Serum CO2/ AG: 26/8 Hematology: WBC: 4.3, Hgb: 14.3,  Lactic/ PCT: 1.1 > 1.4 / pending, COVID-19 & Influenza A/B: negative   CXR 11/19/23: no active disease CT head wo contrast 11/19/23: No evidence of acute intracranial abnormality. Stable multifocal encephalomalacia, likely the sequela of prior trauma. Similar age-advanced cerebral atrophy CT angio chest PE 11/19/23:  No pulmonary embolus. Near complete filling of the right mainstem bronchus with filling extending into the bronchus intermedius, right upper, middle and lower lobe bronchi. Layering debris in the trachea. Findings are highly suspicious for aspiration. Minimal ill-defined patchy and nodular airspace disease in the dependent right lower lobe, likely postobstructive pneumonia.   Disposition: Admitted to [facility]. While holding in the ED, respiratory status worsened and due to high risk for decompensation PCCM consulted and patient intubated for airway protection.   Please see "Significant Hospital Events" section below for full detailed hospital course.  Pertinent  Medical History  Seizures TBI (09/2019)  Micro Data:  1/27: SARS-CoV-2/flu/RSV PCR>> negative 1/27: Blood culture x 2>> no growth 1/28: MRSA PCR>>Positive  1/28: Tracheal  aspirate>>Pseudomonas aeruginosa and Staphylococcus aureus 1/28: HIV screen>> nonreactive 1/29: BAL>>Pseudomonas aeruginosa & MRSA 2/07: Cdiff>>negative  2/15: Blood x2>>NGTD  2/16: Tracheal aspirate>>Pseudomonas aeruginosa 2/16: MRSA PCR +  Antimicrobials:   Anti-infectives (From admission, onward)    Start     Dose/Rate Route Frequency Ordered Stop   12/10/23 1100  linezolid (ZYVOX) tablet 600 mg  Status:  Discontinued        600 mg Per Tube Every 12 hours 12/10/23 1014 12/12/23 1126   12/08/23 1915  vancomycin (VANCOREADY) IVPB 1250 mg/250 mL  Status:  Discontinued        1,250 mg 166.7 mL/hr over 90 Minutes Intravenous 2 times daily 12/08/23 1821 12/10/23 1013   12/08/23 1915  piperacillin-tazobactam (ZOSYN) IVPB 3.375 g  Status:  Discontinued        3.375 g 12.5 mL/hr over 240 Minutes Intravenous Every 8 hours 12/08/23 1821 12/12/23 1105   12/05/23 1443  ceFAZolin (ANCEF) IVPB 1 g/50 mL premix        over 30 Minutes  Continuous PRN 12/05/23 1443 12/05/23 1443   12/05/23 0000  ceFAZolin (ANCEF) IVPB 2g/100 mL premix        2 g 200 mL/hr over 30 Minutes Intravenous To Radiology 12/04/23 1324 12/05/23 0041   12/01/23 2200  vancomycin (VANCOREADY) IVPB 750 mg/150 mL  Status:  Discontinued        750 mg 150 mL/hr over 60 Minutes Intravenous Every 8 hours 12/01/23 1205 12/01/23 1215   12/01/23 1400  vancomycin (VANCOREADY) IVPB 1250 mg/250 mL  Status:  Discontinued        1,250 mg 166.7 mL/hr over 90 Minutes Intravenous  Once 12/01/23 1205 12/01/23 1215   12/01/23 1315  linezolid (ZYVOX) IVPB 600 mg        600 mg 300 mL/hr over 60 Minutes Intravenous Every 12 hours 12/01/23 1215 12/07/23 2253   12/01/23 0230  piperacillin-tazobactam (ZOSYN) IVPB 3.375 g        3.375 g 12.5 mL/hr over 240 Minutes Intravenous Every 8 hours 12/01/23 0131 12/07/23 1944   11/30/23 1500  levofloxacin (LEVAQUIN) IVPB 750 mg  Status:  Discontinued        750 mg 100 mL/hr over 90 Minutes Intravenous  Every 24 hours 11/30/23 0345 12/01/23 0048   11/28/23 0100  vancomycin (VANCOREADY) IVPB 1250 mg/250 mL       Placed in "Followed by" Linked Group   1,250 mg 166.7 mL/hr over 90 Minutes Intravenous Every 12 hours 11/27/23 1153 11/29/23 1331   11/27/23 1300  vancomycin (VANCOREADY) IVPB 1500 mg/300 mL       Placed in "Followed by" Linked Group   1,500 mg 150 mL/hr over 120 Minutes Intravenous  Once 11/27/23 1153 11/27/23 1516   11/23/23 1500  levofloxacin (LEVAQUIN) IVPB 750 mg        750 mg 100 mL/hr over 90 Minutes Intravenous Every 24 hours 11/23/23 1341 11/29/23 1551   11/21/23 2200  doxycycline (VIBRAMYCIN) 100 mg in sodium chloride 0.9 % 250 mL IVPB  Status:  Discontinued        100 mg 125 mL/hr over 120 Minutes Intravenous Every 12 hours 11/21/23 1425 11/27/23 1148   11/21/23 2000  metroNIDAZOLE (FLAGYL) IVPB 500 mg  Status:  Discontinued        500 mg 100 mL/hr over  60 Minutes Intravenous Every 12 hours 11/21/23 1425 11/24/23 1227   11/20/23 1200  Ampicillin-Sulbactam (UNASYN) 3 g in sodium chloride 0.9 % 100 mL IVPB  Status:  Discontinued        3 g 200 mL/hr over 30 Minutes Intravenous Every 6 hours 11/20/23 1131 11/21/23 1423   11/20/23 0800  cefTRIAXone (ROCEPHIN) 2 g in sodium chloride 0.9 % 100 mL IVPB  Status:  Discontinued        2 g 200 mL/hr over 30 Minutes Intravenous Every 24 hours 11/19/23 2322 11/20/23 0229   11/20/23 0100  azithromycin (ZITHROMAX) 500 mg in sodium chloride 0.9 % 250 mL IVPB  Status:  Discontinued        500 mg 250 mL/hr over 60 Minutes Intravenous Every 24 hours 11/19/23 2322 11/21/23 1423   11/19/23 1545  ceFEPIme (MAXIPIME) 2 g in sodium chloride 0.9 % 100 mL IVPB        2 g 200 mL/hr over 30 Minutes Intravenous  Once 11/19/23 1541 11/19/23 1630   11/19/23 1545  metroNIDAZOLE (FLAGYL) IVPB 500 mg        500 mg 100 mL/hr over 60 Minutes Intravenous  Once 11/19/23 1541 11/19/23 1748   11/19/23 1545  vancomycin (VANCOCIN) IVPB 1000 mg/200 mL  premix        1,000 mg 200 mL/hr over 60 Minutes Intravenous  Once 11/19/23 1541 11/19/23 1917      Significant Hospital Events: Including procedures, antibiotic start and stop dates in addition to other pertinent events   11/20/23: Admit to ICU due to acute hypoxic respiratory failure requiring urgent intubation and mechanical ventilatory support secondary to suspected aspiration and pneumonia 11/21/23- patient moving RUE, mother at bedside we reviewed medical plan. He remains on 56mcg/kg/hr levophed, today plan to rescusitate more aggresively and wean from levophed and potentially extubate post SBP.   He is febrile this am.  He is on zithromax, unasyn 11/22/23- s/p bronch yesterday with aspiration of mucus plugging.  Today resp status improved with liberation protocol in proces. SLP post extubation today. 11/23/23- patient is +for pseudomonas resp cultures.  He is also MRSA pcr +, refined therapy during rounds with pharmacist. He remains on MV 11/24/23- patient is still on vasopressor support weaning down on MV.  Secretions are slightly better.  11/25/23- patient weaned off levophed, failed SBT with tachypnea, tachycardia.  Secretions much improved. 11/26/23- on minimal vent support, unable to perform SBT due to copious secretions from ETT.   11/27/23- on minimal vent support, secretions improved.  Change Doxycycline to Vancomycin.  Plan for SBT as tolerated. 11/28/23-Pt successfully extubated 02/4, currently tolerating HHFNC @35L /35%.  Required low dose precedex overnight to allow suctioning due to excessive secretions. Pt with suspected seizure activity developed tremors in the right upper extremity and became minimally responsive.  Received 1g of iv keppra.    11/29/23-Overnight pt developed sinus tachycardia/svt 140 to 160's improved following 2.5 mg iv metoprolol.  Tolerating RA with no signs of respiratory distress.  Transferring to progressive care unit TRH to pick on 02/7 12/01/23: Pt transferred back to  ICU with severe acute hypoxic respiratory failure initially placed on HHFNC but due to significant hypoxia O2 sats in the 60's he required reintubation and underwent emergent bronchoscopy which revealed thick secretions in the LUL and LLL resulting in mucous plugging. Therapeutic aspiration performed. CXR showed collapse of the LUL secondary to mucus plugging  12/03/23: Pt remains mechanically intubated on minimal vent settings.  ENT consulted for tracheostomy  placement due to recurrent respiratory failure due to inability to clear secretions.  Requiring levophed gtt to maintain map 65 or higher  12/04/23: No acute events overnight on minimal settings pending tracheostomy placement  12/05/23: No acute events overnight, on minimal vent support. Initially on low dose Levophed, now weaned off.  IR placed PEG tube. Pending Tracheostomy placement on Friday. 12/06/23: No acute events overnight, on minimal vent support. Awaiting Tracheostomy placement tomorrow. 12/07/23: No acute events overnight.  Tracheostomy placed this morning by ENT, no events with procedure. Will keep sedated today with newly placed Trach with plan for WUA/SBT tomorrow. 12/08/23: All sedation turned off for WUA will perform SBT or TCT as tolerated.  Pt spike at temp 100.9 F, hypotensive requiring levophed gtt, and tachycardic concerning for possible sepsis.  Blood culture/tracheal aspirate and UA sent.  Restarted broad spectrum abx (vancomycin/zosyn) 12/09/23: Pt no longer requiring levophed gtt.  Tmax 102.4 F 12/10/23: Central Line Removed, not requiring pressors or sedation. Tolearting TC. 12/11/23: Overnight was started back on Gabapentin and scheduled Propanol for possible neurostorming with significant improvement in HR from the 170's to mid 100's 12/12/23: HR remains stable in low 100's, not requiring vasopressors, on minimal vent support, SBT as tolerated.  Complete course of ABX as suspect he is colonized with Pseudomonas and MRSA. 12/13/23:  Overnight with concern for possible tube feeds in ETT tubing. Propanol was decreased due to soft BP. CXR this morning without new infiltrate, no leukocytosis or fever, and bowel pattern normal on KUB, will restart tube feeds. Remains on minimal vent support, execise in PSV as tolerated ~ unable to tolerate less than 10/5 in PSV. 12/14/23: No significant events overnight.  Afebrile, hemodynamically stable.  On minimal vent support, continue with SBT/TCT as tolerated. 12/15/23: No change in patient's condition.  Remained on full vent support overnight.  Now on pressure support and tolerating. 12/16/23: Tolerated trach collar trials 12/17/23- patient awaiting placement.  He may have had another seizure and we will order EEG for him today.  He may not leave due to insurance qualification with trache.   Interim History / Subjective:    Objective   Blood pressure 104/80, pulse (!) 123, temperature 99.2 F (37.3 C), resp. rate 15, height 5' 9.02" (1.753 m), weight 62.9 kg, SpO2 98%.    Vent Mode: PRVC FiO2 (%):  [28 %-30 %] 30 % Set Rate:  [15 bmp] 15 bmp Vt Set:  [450 mL] 450 mL PEEP:  [5 cmH20] 5 cmH20   Intake/Output Summary (Last 24 hours) at 12/17/2023 1610 Last data filed at 12/17/2023 0600 Gross per 24 hour  Intake 995 ml  Output 1200 ml  Net -205 ml   Filed Weights   12/15/23 0705 12/16/23 0500 12/17/23 0500  Weight: 62 kg 65.6 kg 62.9 kg   Examination: General: Acute on chronically-ill appearing frail male, NAD, receiving mechanical ventilation via Tracheostomy HENT: Atraumatic, normocephalic, neck supple, no JVD, Tracheostomy in place clean, dry, and intact Lungs: Mechanical breath sound throughout, even, non labored, normal effort Cardiovascular: Sinus tachycardia, s1s2, no m/r/g, 2+ radial/2+ distal pulses, trace generalized edema  Abdomen: +BS x4, soft, non distended, PEG tube in place clean, dry, and intact Extremities: Left upper and lower extremities contractured, but able to  move right upper extremity, bilateral foot drop Neuro: Awake, Left-sided hemiparesis with left arm contractured (baseline), opens eyes to voice and tracks, moves right extremities purposefully but not to command GU: External male catheter in place  Resolved Hospital Problem list  Assessment & Plan:   #Sinus tachycardia/SVT  #Hypotension secondary to sedating medication and possible recurrent sepsis  -Heart rate improved with heart rate staying between 101 115 bpm -Continuous telemetry monitoring - Continue scheduled propanolol 10 mg q8h for possible neurostorming  - IV fluid resuscitation prn and/or vasopressors to maintain map 65 or higher - Continue Midodrine   #Acute hypoxic respiratory failure secondary to aspiration, LUL/LLL mucous plugging, and collapse of the LUL due to mucous plugging in the setting of TBI s/p emergent bronchoscopy 02/8 #Mechanical ventilation  Status post Tracheostomy placement with ENT on 12/06/22 -Full vent support, implement lung protective strategies as needed -Pressure support trials with PEEP of 5 and rate of rate 10 and trach collar trials as tolerated. -Plan is to keep him on trach collar overnight if tolerated.  Okay -FiO2 down to 28%; titrate FiO2 to maintain SpO2 greater than 90% -Chest x-ray and ABG as needed  -Spontaneous Breathing Trials / Trach collar trials as tolerated -Implement VAP Bundle -Prn Bronchodilators -Plan is for patient to be placed in LTAC  #Pseudomonas & MRSA pneumonia~ TREATED, SUSPECT HE IS COLONIZED #Aspiration pneumonia~ TREATED #Possible recurrent sepsis  - Trend WBC and monitor fever curve  - Follow cultures  - Completed course of Linezolid and zosyn 2/19; remains afebrile  #Chronic Seizure Disorder #Chronic TBI #Sedation needs in setting of mechanical ventilation EEG 02/10: obtained while sedated on propofol and comatose and is abnormal due to severe diffuse slowing indicative of global cerebral dysfunction,  medication effect, or both. Epileptiform abnormalities were not seen during this recording.  - Maintain a RASS goal of 0  - scheduled oxycodone per tube 02/15  - Avoid sedating medications as able - Daily wake up assessment - Continue outpatient vimpat and valproic acid  - Continue outpatient baclofen , seroquel and Gabapentin - Seizure precautions   #Dysphagia - TF's via G-tube    Best Practice (right click and "Reselect all SmartList Selections" daily)   Diet/type: TF's  DVT prophylaxis: LMWH  GI prophylaxis: PPI Lines: N/A Foley:  N/A Code Status:  full code Last date of multidisciplinary goals of care discussion [12/14/23]  2/22: No family at bedside.  Will update when available  Labs   CBC: Recent Labs  Lab 12/12/23 0839 12/13/23 0419 12/14/23 0629 12/15/23 0237 12/16/23 0414  WBC 6.4 5.0 5.7 6.3 4.7  HGB 11.3* 10.1* 10.7* 10.6* 10.1*  HCT 34.8* 31.6* 33.6* 33.1* 31.7*  MCV 96.7 97.2 97.4 96.5 97.8  PLT 360 306 365 387 382   Basic Metabolic Panel: Recent Labs  Lab 12/11/23 0335 12/12/23 0839 12/13/23 0419 12/14/23 0629 12/15/23 0237 12/16/23 0414  NA 138 139 138 141 139 138  K 3.6 4.9 4.2 4.5 4.2 4.1  CL 104 106 105 106 101 101  CO2 23 23 25 26 26 26   GLUCOSE 139* 94 113* 87 104* 97  BUN 16 16 11 11 19  21*  CREATININE 0.47* 0.54* 0.48* 0.42* 0.44* 0.41*  CALCIUM 8.7* 8.9 8.7* 8.8* 9.1 9.0  MG 2.1 2.2 2.0 2.1  --   --   PHOS 3.3 3.0 3.0 4.2 4.4 4.2   GFR: Estimated Creatinine Clearance: 122.3 mL/min (A) (by C-G formula based on SCr of 0.41 mg/dL (L)). Recent Labs  Lab 12/13/23 0419 12/14/23 0629 12/15/23 0237 12/16/23 0414  WBC 5.0 5.7 6.3 4.7   Liver Function Tests: Recent Labs  Lab 12/11/23 0335 12/12/23 4098 12/13/23 0419 12/14/23 0629 12/15/23 0237 12/16/23 0414  AST 12*  --   --   --   --   --  ALT 8  --   --   --   --   --   ALKPHOS 67  --   --   --   --   --   BILITOT 0.5  --   --   --   --   --   PROT 6.3*  --   --   --    --   --   ALBUMIN 2.3* 2.4* 2.8* 2.8* 3.1* 2.8*   No results for input(s): "LIPASE", "AMYLASE" in the last 168 hours. No results for input(s): "AMMONIA" in the last 168 hours.  ABG    Component Value Date/Time   PHART 7.44 12/01/2023 1143   PCO2ART 34 12/01/2023 1143   PO2ART 63 (L) 12/01/2023 1143   HCO3 23.1 12/01/2023 1143   TCO2 28 05/31/2021 0404   ACIDBASEDEF 0.5 12/01/2023 1143   O2SAT 93 12/01/2023 1143   Coagulation Profile: No results for input(s): "INR", "PROTIME" in the last 168 hours.  Cardiac Enzymes: No results for input(s): "CKTOTAL", "CKMB", "CKMBINDEX", "TROPONINI" in the last 168 hours.  HbA1C: Hgb A1c MFr Bld  Date/Time Value Ref Range Status  04/11/2022 06:10 AM 5.1 4.8 - 5.6 % Final    Comment:    (NOTE) Pre diabetes:          5.7%-6.4%  Diabetes:              >6.4%  Glycemic control for   <7.0% adults with diabetes   05/31/2021 04:18 PM 4.9 4.8 - 5.6 % Final    Comment:    (NOTE) Pre diabetes:          5.7%-6.4%  Diabetes:              >6.4%  Glycemic control for   <7.0% adults with diabetes    CBG: Recent Labs  Lab 12/16/23 0749 12/16/23 1225 12/16/23 1617 12/16/23 2356 12/17/23 0622  GLUCAP 129* 90 110* 101* 120*    Review of Systems:   Unable to assess pt nonverbal and Tracheostomy in place  Past Medical History:  He,  has a past medical history of 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   Surgical History:   Past Surgical History:  Procedure Laterality Date   GASTROSTOMY TUBE PLACEMENT     IR GASTROSTOMY TUBE MOD SED  12/05/2023   TRACHEOSTOMY TUBE PLACEMENT N/A 12/07/2023   Procedure: TRACHEOSTOMY;  Surgeon: Lanell Persons, MD;  Location: [facility] ORS;  Service: ENT;  Laterality: N/A;     Social History:   reports that he has never smoked. He has never used smokeless tobacco. He reports that he does not currently use alcohol.  He reports that he does not use drugs.   Family History:  His family history is not on file.   Allergies No Known Allergies   Home Medications  Prior to Admission medications   Medication Sig Start Date End Date Taking? Authorizing Provider  baclofen (LIORESAL) 10 MG tablet Take 10 mg by mouth 3 (three) times daily.   Yes [provider]  diazePAM, 15 MG Dose, (VALTOCO 15 MG DOSE) 2 x 7.5 MG/0.1ML LQPK Spray 7.5 mg into each nostril in the event of a seizure 04/20/22  Yes Zigmund Daniel., MD  gabapentin (NEURONTIN) 400 MG capsule Take 1 capsule (400 mg total) by mouth 3 (three) times daily. 04/20/22 11/20/23 Yes Zigmund Daniel., MD  lacosamide 100 MG TABS  Take 1 tablet (100 mg total) by mouth 2 (two) times daily. 04/20/22 11/20/23 Yes Zigmund Daniel., MD  melatonin 5 MG TABS Take 10 mg by mouth at bedtime.   Yes [provider]  QUEtiapine (SEROQUEL XR) 200 MG 24 hr tablet Take 200 mg by mouth in the morning.   Yes [provider]  QUEtiapine (SEROQUEL) 25 MG tablet Take 25 mg by mouth at bedtime.   Yes [provider]  valproic acid (DEPAKENE) 250 MG capsule Take 4 capsules (1,000 mg total) by mouth 2 (two) times daily. Patient taking differently: Take 500 mg by mouth every morning. 04/20/22 11/20/23 Yes Zigmund Daniel., MD  valproic acid (DEPAKENE) 250 MG capsule Take 750 mg by mouth every evening.   Yes [provider]   Scheduled Meds:  baclofen  10 mg Per Tube TID   Chlorhexidine Gluconate Cloth  6 each Topical PC lunch   enoxaparin (LOVENOX) injection  40 mg Subcutaneous Q24H   feeding supplement (PROSource TF20)  60 mL Per Tube Daily   free water  30 mL Per Tube Q4H   gabapentin  300 mg Per Tube Q8H   lacosamide  100 mg Per Tube BID   midodrine  10 mg Per Tube TID WC   nutrition supplement (JUVEN)  1 packet Per Tube BID BM   mouth rinse  15 mL Mouth Rinse Q2H   oxyCODONE  5 mg Per Tube Q6H   pantoprazole  (PROTONIX) IV  40 mg Intravenous Q12H   propranolol  10 mg Per Tube Q8H   QUEtiapine  50 mg Per Tube TID   sodium chloride flush  10-40 mL Intracatheter Q12H   valproic acid  500 mg Per Tube Daily   valproic acid  750 mg Per Tube QHS   Continuous Infusions:  feeding supplement (OSMOLITE 1.5 CAL) 60 mL/hr at 12/17/23 0600   PRN Meds:.acetaminophen **OR** acetaminophen, bisacodyl, glycopyrrolate, levalbuterol, lip balm, liver oil-zinc oxide, ondansetron **OR** ondansetron (ZOFRAN) IV, mouth rinse, polyethylene glycol, senna, sodium chloride flush   Critical care provider statement:   Total critical care time: 33 minutes   Performed by: Karna Christmas MD   Critical care time was exclusive of separately billable procedures and treating other patients.   Critical care was necessary to treat or prevent imminent or life-threatening deterioration.   Critical care was time spent personally by me on the following activities: development of treatment plan with patient and/or surrogate as well as nursing, discussions with consultants, evaluation of patient's response to treatment, examination of patient, obtaining history from patient or surrogate, ordering and performing treatments and interventions, ordering and review of laboratory studies, ordering and review of radiographic studies, pulse oximetry and re-evaluation of patient's condition.    Vida Rigger, M.D.  Pulmonary & Critical Care Medicine

## 2023-12-17 NOTE — Plan of Care (Signed)
  Problem: Fluid Volume: Goal: Hemodynamic stability will improve Outcome: Progressing   Problem: Clinical Measurements: Goal: Diagnostic test results will improve Outcome: Progressing Goal: Signs and symptoms of infection will decrease Outcome: Progressing   Problem: Respiratory: Goal: Ability to maintain adequate ventilation will improve Outcome: Progressing   Problem: Activity: Goal: Ability to tolerate increased activity will improve Outcome: Progressing   Problem: Respiratory: Goal: Ability to maintain a clear airway and adequate ventilation will improve Outcome: Progressing   Problem: Role Relationship: Goal: Method of communication will improve Outcome: Progressing   Problem: Education: Goal: Knowledge of General Education information will improve Description: Including pain rating scale, medication(s)/side effects and non-pharmacologic comfort measures Outcome: Progressing   Problem: Health Behavior/Discharge Planning: Goal: Ability to manage health-related needs will improve Outcome: Progressing   Problem: Clinical Measurements: Goal: Ability to maintain clinical measurements within normal limits will improve Outcome: Progressing Goal: Will remain free from infection Outcome: Progressing Goal: Diagnostic test results will improve Outcome: Progressing Goal: Respiratory complications will improve Outcome: Progressing Goal: Cardiovascular complication will be avoided Outcome: Progressing   Problem: Activity: Goal: Risk for activity intolerance will decrease Outcome: Progressing   Problem: Nutrition: Goal: Adequate nutrition will be maintained Outcome: Progressing   Problem: Coping: Goal: Level of anxiety will decrease Outcome: Progressing   Problem: Elimination: Goal: Will not experience complications related to bowel motility Outcome: Progressing Goal: Will not experience complications related to urinary retention Outcome: Progressing   Problem:  Pain Managment: Goal: General experience of comfort will improve and/or be controlled Outcome: Progressing   Problem: Safety: Goal: Ability to remain free from injury will improve Outcome: Progressing   Problem: Skin Integrity: Goal: Risk for impaired skin integrity will decrease Outcome: Progressing   Problem: Activity: Goal: Ability to tolerate increased activity will improve Outcome: Progressing   Problem: Respiratory: Goal: Ability to maintain a clear airway and adequate ventilation will improve Outcome: Progressing   Problem: Role Relationship: Goal: Method of communication will improve Outcome: Progressing

## 2023-12-18 LAB — CBC WITH DIFFERENTIAL/PLATELET
Abs Immature Granulocytes: 0.03 10*3/uL (ref 0.00–0.07)
Basophils Absolute: 0 10*3/uL (ref 0.0–0.1)
Basophils Relative: 0 %
Eosinophils Absolute: 0.4 10*3/uL (ref 0.0–0.5)
Eosinophils Relative: 7 %
HCT: 35.2 % — ABNORMAL LOW (ref 39.0–52.0)
Hemoglobin: 11.5 g/dL — ABNORMAL LOW (ref 13.0–17.0)
Immature Granulocytes: 1 %
Lymphocytes Relative: 24 %
Lymphs Abs: 1.4 10*3/uL (ref 0.7–4.0)
MCH: 30.9 pg (ref 26.0–34.0)
MCHC: 32.7 g/dL (ref 30.0–36.0)
MCV: 94.6 fL (ref 80.0–100.0)
Monocytes Absolute: 0.6 10*3/uL (ref 0.1–1.0)
Monocytes Relative: 10 %
Neutro Abs: 3.5 10*3/uL (ref 1.7–7.7)
Neutrophils Relative %: 58 %
Platelets: 513 10*3/uL — ABNORMAL HIGH (ref 150–400)
RBC: 3.72 MIL/uL — ABNORMAL LOW (ref 4.22–5.81)
RDW: 13.2 % (ref 11.5–15.5)
WBC: 6 10*3/uL (ref 4.0–10.5)
nRBC: 0 % (ref 0.0–0.2)

## 2023-12-18 LAB — BASIC METABOLIC PANEL
Anion gap: 14 (ref 5–15)
BUN: 12 mg/dL (ref 6–20)
CO2: 27 mmol/L (ref 22–32)
Calcium: 9.2 mg/dL (ref 8.9–10.3)
Chloride: 99 mmol/L (ref 98–111)
Creatinine, Ser: 0.52 mg/dL — ABNORMAL LOW (ref 0.61–1.24)
GFR, Estimated: >60 mL/min (ref >60)
Glucose, Bld: 95 mg/dL (ref 70–99)
Potassium: 4.5 mmol/L (ref 3.5–5.1)
Sodium: 140 mmol/L (ref 135–145)

## 2023-12-18 LAB — PHOSPHORUS: Phosphorus: 5.1 mg/dL — ABNORMAL HIGH (ref 2.5–4.6)

## 2023-12-18 LAB — MAGNESIUM: Magnesium: 2.2 mg/dL (ref 1.7–2.4)

## 2023-12-18 NOTE — Progress Notes (Signed)
 Brief Interventional Radiology Note:  Patient with a history of TBI seen for g-tube site s/p placement of 22 Fr balloon retention gastrostomy tube on 12/05/23 in IR with Dr. Vella Redhead.   Independently the external suture of the t- fastener was cut and all three  t-fasteners were removed successfully without incident. Patient tolerated removal well. Skin site unremarkable with no erythema, tenderness or drainage noted. A clean dressing was applied around the gastrotomy tube exit site.     Ok to continue using g-tube for medicines, tube feeds, free water, etc.  Please call IR for any questions or concerns regarding the g-tube.  Electronically Signed: Jama Flavors, PA-C 12/18/2023, 4:17 PM

## 2023-12-18 NOTE — Plan of Care (Signed)
  Problem: Fluid Volume: Goal: Hemodynamic stability will improve Outcome: Progressing   Problem: Clinical Measurements: Goal: Diagnostic test results will improve Outcome: Progressing Goal: Signs and symptoms of infection will decrease Outcome: Progressing   Problem: Respiratory: Goal: Ability to maintain adequate ventilation will improve Outcome: Progressing   Problem: Activity: Goal: Ability to tolerate increased activity will improve Outcome: Progressing   Problem: Respiratory: Goal: Ability to maintain a clear airway and adequate ventilation will improve Outcome: Progressing   Problem: Role Relationship: Goal: Method of communication will improve Outcome: Progressing   Problem: Education: Goal: Knowledge of General Education information will improve Description: Including pain rating scale, medication(s)/side effects and non-pharmacologic comfort measures Outcome: Progressing   Problem: Health Behavior/Discharge Planning: Goal: Ability to manage health-related needs will improve Outcome: Progressing   Problem: Clinical Measurements: Goal: Ability to maintain clinical measurements within normal limits will improve Outcome: Progressing Goal: Will remain free from infection Outcome: Progressing Goal: Diagnostic test results will improve Outcome: Progressing Goal: Respiratory complications will improve Outcome: Progressing Goal: Cardiovascular complication will be avoided Outcome: Progressing   Problem: Activity: Goal: Risk for activity intolerance will decrease Outcome: Progressing   Problem: Nutrition: Goal: Adequate nutrition will be maintained Outcome: Progressing   Problem: Coping: Goal: Level of anxiety will decrease Outcome: Progressing   Problem: Elimination: Goal: Will not experience complications related to bowel motility Outcome: Progressing Goal: Will not experience complications related to urinary retention Outcome: Progressing   Problem:  Pain Managment: Goal: General experience of comfort will improve and/or be controlled Outcome: Progressing   Problem: Safety: Goal: Ability to remain free from injury will improve Outcome: Progressing   Problem: Skin Integrity: Goal: Risk for impaired skin integrity will decrease Outcome: Progressing   Problem: Activity: Goal: Ability to tolerate increased activity will improve Outcome: Progressing   Problem: Respiratory: Goal: Ability to maintain a clear airway and adequate ventilation will improve Outcome: Progressing   Problem: Role Relationship: Goal: Method of communication will improve Outcome: Progressing

## 2023-12-18 NOTE — Progress Notes (Signed)
 NAME:  Alex [name], MRN:  [id number], DOB:  Dec 11, 1995, LOS: 29 ADMISSION DATE:  11/19/2023, CONSULTATION DATE:  11/20/23 REFERRING MD:  Dr. Arville Care, CHIEF COMPLAINT:  AMS   Brief Pt Description / Synopsis:  28 y.o. male with PMHx significant for chronic TBI with left sided hemiparesis admitted with Acute Metabolic Encephalopathy and Acute Hypoxic Respiratory Failure in the setting of aspiration and Pseudomonas and MRSA Pneumonia requiring intubation and mechanical ventilation. With recurrent aspiration events, required Tracheostomy placement by ENT on 12/07/23.  History of Present Illness:  28 yo M presenting to [facility] ED from outpatient rehab on 11/19/23 for evaluation of altered mental status.   History obtained per chart review and mother's telephone interview, patient unable to participate in interview due to respiratory distress and baseline TBI. 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 He was treated at [facility] for 5 months then discharged to rehab. Per his mother and legal guardian his baseline is: Non verbal but he will yell out sporadic nonsensical words with left sided paralysis. He is able to move his RUE in order to feed himself with finger foods and he has some involuntary movement with that arm, he will swat at you. Similar baseline function with RLE, he can kick and move, but often will lay it bent and to the side. He has enough strength to even try and get out of bed on the right side. She denies any issues with swallowing, but eats mostly soft food. If he is not watched closely with eating he will try to eat all the food at once. The facility staff reported he was at his normal baseline until lunchtime on 11/19/23. It was observed he was more somnolent and not interactive. Staff noted a cough and slightly increased work of breathing. Mom also confirmed noting a congested cough the last time she visited earlier in the week. Staff and mom denied nausea/ vomiting,  but mom reports chronic loose stools.   EMS reported the patient febrile and tachycardic on arrival.   ED course: Upon arrival patient tachycardic and lethargic. Sepsis protocol initiated with antibiotics and IVF resuscitation. Labs significant for mild hypokalemia, otherwise WNL. Initial imaging unremarkable but then patient became hypoxic with SpO2 85% on RA and a CTa was obtained, negative for PE but concerning for aspiration pneumonia.  Medications given: Cefepime, Flagyl, Vancomycin, 2 L IVF bolus, IV contrast Initial Vitals: 97.9, 17, 130, 119/82 & 92 on RA Significant labs: (Labs/ Imaging personally reviewed) EKG : pending Chemistry: Na+: 138, K+: 3.4, BUN/Cr.: 13/ 0.65, Serum CO2/ AG: 26/8 Hematology: WBC: 4.3, Hgb: 14.3,  Lactic/ PCT: 1.1 > 1.4 / pending, COVID-19 & Influenza A/B: negative   CXR 11/19/23: no active disease CT head wo contrast 11/19/23: No evidence of acute intracranial abnormality. Stable multifocal encephalomalacia, likely the sequela of prior trauma. Similar age-advanced cerebral atrophy CT angio chest PE 11/19/23:  No pulmonary embolus. Near complete filling of the right mainstem bronchus with filling extending into the bronchus intermedius, right upper, middle and lower lobe bronchi. Layering debris in the trachea. Findings are highly suspicious for aspiration. Minimal ill-defined patchy and nodular airspace disease in the dependent right lower lobe, likely postobstructive pneumonia.   Disposition: Admitted to [facility] service. While holding in the ED, respiratory status worsened and due to high risk for decompensation PCCM consulted and patient intubated for airway protection.   Please see "Significant Hospital Events" section below for full detailed hospital course.  Pertinent  Medical History  Seizures TBI (09/2019)  Micro Data:  1/27: SARS-CoV-2/flu/RSV PCR>> negative 1/27: Blood culture x 2>> no growth 1/28: MRSA PCR>>Positive  1/28: Tracheal  aspirate>>Pseudomonas aeruginosa and Staphylococcus aureus 1/28: HIV screen>> nonreactive 1/29: BAL>>Pseudomonas aeruginosa & MRSA 2/07: Cdiff>>negative  2/15: Blood x2>>NGTD  2/16: Tracheal aspirate>>Pseudomonas aeruginosa 2/16: MRSA PCR +  Antimicrobials:   Anti-infectives (From admission, onward)    Start     Dose/Rate Route Frequency Ordered Stop   12/10/23 1100  linezolid (ZYVOX) tablet 600 mg  Status:  Discontinued        600 mg Per Tube Every 12 hours 12/10/23 1014 12/12/23 1126   12/08/23 1915  vancomycin (VANCOREADY) IVPB 1250 mg/250 mL  Status:  Discontinued        1,250 mg 166.7 mL/hr over 90 Minutes Intravenous 2 times daily 12/08/23 1821 12/10/23 1013   12/08/23 1915  piperacillin-tazobactam (ZOSYN) IVPB 3.375 g  Status:  Discontinued        3.375 g 12.5 mL/hr over 240 Minutes Intravenous Every 8 hours 12/08/23 1821 12/12/23 1105   12/05/23 1443  ceFAZolin (ANCEF) IVPB 1 g/50 mL premix        over 30 Minutes  Continuous PRN 12/05/23 1443 12/05/23 1443   12/05/23 0000  ceFAZolin (ANCEF) IVPB 2g/100 mL premix        2 g 200 mL/hr over 30 Minutes Intravenous To Radiology 12/04/23 1324 12/05/23 0041   12/01/23 2200  vancomycin (VANCOREADY) IVPB 750 mg/150 mL  Status:  Discontinued        750 mg 150 mL/hr over 60 Minutes Intravenous Every 8 hours 12/01/23 1205 12/01/23 1215   12/01/23 1400  vancomycin (VANCOREADY) IVPB 1250 mg/250 mL  Status:  Discontinued        1,250 mg 166.7 mL/hr over 90 Minutes Intravenous  Once 12/01/23 1205 12/01/23 1215   12/01/23 1315  linezolid (ZYVOX) IVPB 600 mg        600 mg 300 mL/hr over 60 Minutes Intravenous Every 12 hours 12/01/23 1215 12/07/23 2253   12/01/23 0230  piperacillin-tazobactam (ZOSYN) IVPB 3.375 g        3.375 g 12.5 mL/hr over 240 Minutes Intravenous Every 8 hours 12/01/23 0131 12/07/23 1944   11/30/23 1500  levofloxacin (LEVAQUIN) IVPB 750 mg  Status:  Discontinued        750 mg 100 mL/hr over 90 Minutes Intravenous  Every 24 hours 11/30/23 0345 12/01/23 0048   11/28/23 0100  vancomycin (VANCOREADY) IVPB 1250 mg/250 mL       Placed in "Followed by" Linked Group   1,250 mg 166.7 mL/hr over 90 Minutes Intravenous Every 12 hours 11/27/23 1153 11/29/23 1331   11/27/23 1300  vancomycin (VANCOREADY) IVPB 1500 mg/300 mL       Placed in "Followed by" Linked Group   1,500 mg 150 mL/hr over 120 Minutes Intravenous  Once 11/27/23 1153 11/27/23 1516   11/23/23 1500  levofloxacin (LEVAQUIN) IVPB 750 mg        750 mg 100 mL/hr over 90 Minutes Intravenous Every 24 hours 11/23/23 1341 11/29/23 1551   11/21/23 2200  doxycycline (VIBRAMYCIN) 100 mg in sodium chloride 0.9 % 250 mL IVPB  Status:  Discontinued        100 mg 125 mL/hr over 120 Minutes Intravenous Every 12 hours 11/21/23 1425 11/27/23 1148   11/21/23 2000  metroNIDAZOLE (FLAGYL) IVPB 500 mg  Status:  Discontinued        500 mg 100 mL/hr over  60 Minutes Intravenous Every 12 hours 11/21/23 1425 11/24/23 1227   11/20/23 1200  Ampicillin-Sulbactam (UNASYN) 3 g in sodium chloride 0.9 % 100 mL IVPB  Status:  Discontinued        3 g 200 mL/hr over 30 Minutes Intravenous Every 6 hours 11/20/23 1131 11/21/23 1423   11/20/23 0800  cefTRIAXone (ROCEPHIN) 2 g in sodium chloride 0.9 % 100 mL IVPB  Status:  Discontinued        2 g 200 mL/hr over 30 Minutes Intravenous Every 24 hours 11/19/23 2322 11/20/23 0229   11/20/23 0100  azithromycin (ZITHROMAX) 500 mg in sodium chloride 0.9 % 250 mL IVPB  Status:  Discontinued        500 mg 250 mL/hr over 60 Minutes Intravenous Every 24 hours 11/19/23 2322 11/21/23 1423   11/19/23 1545  ceFEPIme (MAXIPIME) 2 g in sodium chloride 0.9 % 100 mL IVPB        2 g 200 mL/hr over 30 Minutes Intravenous  Once 11/19/23 1541 11/19/23 1630   11/19/23 1545  metroNIDAZOLE (FLAGYL) IVPB 500 mg        500 mg 100 mL/hr over 60 Minutes Intravenous  Once 11/19/23 1541 11/19/23 1748   11/19/23 1545  vancomycin (VANCOCIN) IVPB 1000 mg/200 mL  premix        1,000 mg 200 mL/hr over 60 Minutes Intravenous  Once 11/19/23 1541 11/19/23 1917      Significant Hospital Events: Including procedures, antibiotic start and stop dates in addition to other pertinent events   11/20/23: Admit to ICU due to acute hypoxic respiratory failure requiring urgent intubation and mechanical ventilatory support secondary to suspected aspiration and pneumonia 11/21/23- patient moving RUE, mother at bedside we reviewed medical plan. He remains on 34mcg/kg/hr levophed, today plan to rescusitate more aggresively and wean from levophed and potentially extubate post SBP.   He is febrile this am.  He is on zithromax, unasyn 11/22/23- s/p bronch yesterday with aspiration of mucus plugging.  Today resp status improved with liberation protocol in proces. SLP post extubation today. 11/23/23- patient is +for pseudomonas resp cultures.  He is also MRSA pcr +, refined therapy during rounds with pharmacist. He remains on MV 11/24/23- patient is still on vasopressor support weaning down on MV.  Secretions are slightly better.  11/25/23- patient weaned off levophed, failed SBT with tachypnea, tachycardia.  Secretions much improved. 11/26/23- on minimal vent support, unable to perform SBT due to copious secretions from ETT.   11/27/23- on minimal vent support, secretions improved.  Change Doxycycline to Vancomycin.  Plan for SBT as tolerated. 11/28/23-Pt successfully extubated 02/4, currently tolerating HHFNC @35L /35%.  Required low dose precedex overnight to allow suctioning due to excessive secretions. Pt with suspected seizure activity developed tremors in the right upper extremity and became minimally responsive.  Received 1g of iv keppra.    11/29/23-Overnight pt developed sinus tachycardia/svt 140 to 160's improved following 2.5 mg iv metoprolol.  Tolerating RA with no signs of respiratory distress.  Transferring to progressive care unit TRH to pick on 02/7 12/01/23: Pt transferred back to  ICU with severe acute hypoxic respiratory failure initially placed on HHFNC but due to significant hypoxia O2 sats in the 60's he required reintubation and underwent emergent bronchoscopy which revealed thick secretions in the LUL and LLL resulting in mucous plugging. Therapeutic aspiration performed. CXR showed collapse of the LUL secondary to mucus plugging  12/03/23: Pt remains mechanically intubated on minimal vent settings.  ENT consulted for tracheostomy  placement due to recurrent respiratory failure due to inability to clear secretions.  Requiring levophed gtt to maintain map 65 or higher  12/04/23: No acute events overnight on minimal settings pending tracheostomy placement  12/05/23: No acute events overnight, on minimal vent support. Initially on low dose Levophed, now weaned off.  IR placed PEG tube. Pending Tracheostomy placement on Friday. 12/06/23: No acute events overnight, on minimal vent support. Awaiting Tracheostomy placement tomorrow. 12/07/23: No acute events overnight.  Tracheostomy placed this morning by ENT, no events with procedure. Will keep sedated today with newly placed Trach with plan for WUA/SBT tomorrow. 12/08/23: All sedation turned off for WUA will perform SBT or TCT as tolerated.  Pt spike at temp 100.9 F, hypotensive requiring levophed gtt, and tachycardic concerning for possible sepsis.  Blood culture/tracheal aspirate and UA sent.  Restarted broad spectrum abx (vancomycin/zosyn) 12/09/23: Pt no longer requiring levophed gtt.  Tmax 102.4 F 12/10/23: Central Line Removed, not requiring pressors or sedation. Tolearting TC. 12/11/23: Overnight was started back on Gabapentin and scheduled Propanol for possible neurostorming with significant improvement in HR from the 170's to mid 100's 12/12/23: HR remains stable in low 100's, not requiring vasopressors, on minimal vent support, SBT as tolerated.  Complete course of ABX as suspect he is colonized with Pseudomonas and MRSA. 12/13/23:  Overnight with concern for possible tube feeds in ETT tubing. Propanol was decreased due to soft BP. CXR this morning without new infiltrate, no leukocytosis or fever, and bowel pattern normal on KUB, will restart tube feeds. Remains on minimal vent support, execise in PSV as tolerated ~ unable to tolerate less than 10/5 in PSV. 12/14/23: No significant events overnight.  Afebrile, hemodynamically stable.  On minimal vent support, continue with SBT/TCT as tolerated. 12/15/23: No change in patient's condition.  Remained on full vent support overnight.  Now on pressure support and tolerating. 12/16/23: Tolerated trach collar trials 12/17/23- patient awaiting placement.  He may have had another seizure and we will order EEG for him today.  He may not leave due to insurance qualification with trache.  12/18/23- Patient with no acute events overnight. Labs including cbc and bmp reviewed with no significant new findings. Awaiting placement for LTACH.   Interim History / Subjective:    Objective   Blood pressure (!) 96/55, pulse (!) 127, temperature 98.2 F (36.8 C), temperature source Oral, resp. rate 15, height 5' 9.02" (1.753 m), weight 64 kg, SpO2 92%.    FiO2 (%):  [30 %] 30 %   Intake/Output Summary (Last 24 hours) at 12/18/2023 0948 Last data filed at 12/18/2023 0600 Gross per 24 hour  Intake 840 ml  Output 1550 ml  Net -710 ml   Filed Weights   12/16/23 0500 12/17/23 0500 12/18/23 0419  Weight: 65.6 kg 62.9 kg 64 kg   Examination: General: Acute on chronically-ill appearing frail male, NAD, receiving mechanical ventilation via Tracheostomy HENT: Atraumatic, normocephalic, neck supple, no JVD, Tracheostomy in place clean, dry, and intact Lungs: Mechanical breath sound throughout, even, non labored, normal effort Cardiovascular: Sinus tachycardia, s1s2, no m/r/g, 2+ radial/2+ distal pulses, trace generalized edema  Abdomen: +BS x4, soft, non distended, PEG tube in place clean, dry, and  intact Extremities: Left upper and lower extremities contractured, but able to move right upper extremity, bilateral foot drop Neuro: Awake, Left-sided hemiparesis with left arm contractured (baseline), opens eyes to voice and tracks, moves right extremities purposefully but not to command GU: External male catheter in place   Assessment &  Plan:   #Sinus tachycardia/SVT  #Hypotension secondary to sedating medication and possible recurrent sepsis  -Heart rate improved with heart rate staying between 101 115 bpm -Continuous telemetry monitoring - Continue scheduled propanolol 10 mg q8h for possible neurostorming  - IV fluid resuscitation prn and/or vasopressors to maintain map 65 or higher - Continue Midodrine   #Acute hypoxic respiratory failure secondary to aspiration, LUL/LLL mucous plugging, and collapse of the LUL due to mucous plugging in the setting of TBI s/p emergent bronchoscopy 02/8 #Mechanical ventilation  Status post Tracheostomy placement with ENT on 12/06/22 -Full vent support, implement lung protective strategies as needed -Pressure support trials with PEEP of 5 and rate of rate 10 and trach collar trials as tolerated. -Plan is to keep him on trach collar overnight if tolerated.  Okay -FiO2 down to 28%; titrate FiO2 to maintain SpO2 greater than 90% -Chest x-ray and ABG as needed  -Spontaneous Breathing Trials / Trach collar trials as tolerated -Implement VAP Bundle -Prn Bronchodilators -Plan is for patient to be placed in LTAC  #Pseudomonas & MRSA pneumonia~ TREATED, SUSPECT HE IS COLONIZED #Aspiration pneumonia~ TREATED #Possible recurrent sepsis  - Trend WBC and monitor fever curve  - Follow cultures  - Completed course of Linezolid and zosyn 2/19; remains afebrile  #Chronic Seizure Disorder #Chronic TBI #Sedation needs in setting of mechanical ventilation EEG 02/10: obtained while sedated on propofol and comatose and is abnormal due to severe diffuse slowing  indicative of global cerebral dysfunction, medication effect, or both. Epileptiform abnormalities were not seen during this recording.  - Maintain a RASS goal of 0  - scheduled oxycodone per tube 02/15  - Avoid sedating medications as able - Daily wake up assessment - Continue outpatient vimpat and valproic acid  - Continue outpatient baclofen , seroquel and Gabapentin - Seizure precautions   #Dysphagia - TF's via G-tube    Best Practice (right click and "Reselect all SmartList Selections" daily)   Diet/type: TF's  DVT prophylaxis: LMWH  GI prophylaxis: PPI Lines: N/A Foley:  N/A Code Status:  full code Last date of multidisciplinary goals of care discussion [12/14/23]  2/22: No family at bedside.  Will update when available  Labs   CBC: Recent Labs  Lab 12/13/23 0419 12/14/23 0629 12/15/23 0237 12/16/23 0414 12/18/23 0423  WBC 5.0 5.7 6.3 4.7 6.0  NEUTROABS  --   --   --   --  3.5  HGB 10.1* 10.7* 10.6* 10.1* 11.5*  HCT 31.6* 33.6* 33.1* 31.7* 35.2*  MCV 97.2 97.4 96.5 97.8 94.6  PLT 306 365 387 382 513*   Basic Metabolic Panel: Recent Labs  Lab 12/12/23 0839 12/13/23 0419 12/14/23 0629 12/15/23 0237 12/16/23 0414 12/18/23 0423  NA 139 138 141 139 138 140  K 4.9 4.2 4.5 4.2 4.1 4.5  CL 106 105 106 101 101 99  CO2 23 25 26 26 26 27   GLUCOSE 94 113* 87 104* 97 95  BUN 16 11 11 19  21* 12  CREATININE 0.54* 0.48* 0.42* 0.44* 0.41* 0.52*  CALCIUM 8.9 8.7* 8.8* 9.1 9.0 9.2  MG 2.2 2.0 2.1  --   --  2.2  PHOS 3.0 3.0 4.2 4.4 4.2 5.1*   GFR: Estimated Creatinine Clearance: 124.4 mL/min (A) (by C-G formula based on SCr of 0.52 mg/dL (L)). Recent Labs  Lab 12/14/23 0629 12/15/23 0237 12/16/23 0414 12/18/23 0423  WBC 5.7 6.3 4.7 6.0   Liver Function Tests: Recent Labs  Lab 12/12/23 215 835 2447 12/13/23 0419 12/14/23  6962 12/15/23 0237 12/16/23 0414  ALBUMIN 2.4* 2.8* 2.8* 3.1* 2.8*   No results for input(s): "LIPASE", "AMYLASE" in the last 168 hours. No  results for input(s): "AMMONIA" in the last 168 hours.  ABG    Component Value Date/Time   PHART 7.44 12/01/2023 1143   PCO2ART 34 12/01/2023 1143   PO2ART 63 (L) 12/01/2023 1143   HCO3 23.1 12/01/2023 1143   TCO2 28 05/31/2021 0404   ACIDBASEDEF 0.5 12/01/2023 1143   O2SAT 93 12/01/2023 1143   Coagulation Profile: No results for input(s): "INR", "PROTIME" in the last 168 hours.  Cardiac Enzymes: No results for input(s): "CKTOTAL", "CKMB", "CKMBINDEX", "TROPONINI" in the last 168 hours.  HbA1C: Hgb A1c MFr Bld  Date/Time Value Ref Range Status  04/11/2022 06:10 AM 5.1 4.8 - 5.6 % Final    Comment:    (NOTE) Pre diabetes:          5.7%-6.4%  Diabetes:              >6.4%  Glycemic control for   <7.0% adults with diabetes   05/31/2021 04:18 PM 4.9 4.8 - 5.6 % Final    Comment:    (NOTE) Pre diabetes:          5.7%-6.4%  Diabetes:              >6.4%  Glycemic control for   <7.0% adults with diabetes    CBG: Recent Labs  Lab 12/16/23 1225 12/16/23 1617 12/16/23 2356 12/17/23 0622 12/17/23 1048  GLUCAP 90 110* 101* 120* 85    Review of Systems:   Unable to assess pt nonverbal and Tracheostomy in place  Past Medical History:  He,  has a past medical history of 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   Surgical History:   Past Surgical History:  Procedure Laterality Date   GASTROSTOMY TUBE PLACEMENT     IR GASTROSTOMY TUBE MOD SED  12/05/2023   TRACHEOSTOMY TUBE PLACEMENT N/A 12/07/2023   Procedure: TRACHEOSTOMY;  Surgeon: Lanell Persons, MD;  Location: [facility] ORS;  Service: ENT;  Laterality: N/A;     Social History:   reports that he has never smoked. He has never used smokeless tobacco. He reports that he does not currently use alcohol. He reports that he does not use drugs.   Family History:  His family history is not on file.   Allergies No Known  Allergies   Home Medications  Prior to Admission medications   Medication Sig Start Date End Date Taking? Authorizing Provider  baclofen (LIORESAL) 10 MG tablet Take 10 mg by mouth 3 (three) times daily.   Yes [provider]  diazePAM, 15 MG Dose, (VALTOCO 15 MG DOSE) 2 x 7.5 MG/0.1ML LQPK Spray 7.5 mg into each nostril in the event of a seizure 04/20/22  Yes Zigmund Daniel., MD  gabapentin (NEURONTIN) 400 MG capsule Take 1 capsule (400 mg total) by mouth 3 (three) times daily. 04/20/22 11/20/23 Yes Zigmund Daniel., MD  lacosamide 100 MG TABS Take 1 tablet (100 mg total) by mouth 2 (two) times daily. 04/20/22 11/20/23 Yes Zigmund Daniel., MD  melatonin 5 MG TABS Take 10 mg by mouth at bedtime.   Yes [provider]  QUEtiapine (SEROQUEL XR) 200 MG 24 hr tablet Take 200 mg by mouth in the morning.   Yes [provider]  QUEtiapine (SEROQUEL) 25 MG tablet  Take 25 mg by mouth at bedtime.   Yes [provider]  valproic acid (DEPAKENE) 250 MG capsule Take 4 capsules (1,000 mg total) by mouth 2 (two) times daily. Patient taking differently: Take 500 mg by mouth every morning. 04/20/22 11/20/23 Yes Zigmund Daniel., MD  valproic acid (DEPAKENE) 250 MG capsule Take 750 mg by mouth every evening.   Yes [provider]   Scheduled Meds:  baclofen  10 mg Per Tube TID   Chlorhexidine Gluconate Cloth  6 each Topical PC lunch   enoxaparin (LOVENOX) injection  40 mg Subcutaneous Q24H   feeding supplement (PROSource TF20)  60 mL Per Tube Daily   free water  30 mL Per Tube Q4H   gabapentin  300 mg Per Tube Q8H   lacosamide  100 mg Per Tube BID   midodrine  10 mg Per Tube TID WC   nutrition supplement (JUVEN)  1 packet Per Tube BID BM   mouth rinse  15 mL Mouth Rinse Q2H   oxyCODONE  5 mg Per Tube Q6H   pantoprazole (PROTONIX) IV  40 mg Intravenous Q12H   propranolol  10 mg Per Tube Q8H   QUEtiapine  50 mg Per Tube TID   sodium chloride  flush  10-40 mL Intracatheter Q12H   valproic acid  500 mg Per Tube Daily   valproic acid  750 mg Per Tube QHS   Continuous Infusions:  feeding supplement (OSMOLITE 1.5 CAL) 60 mL/hr at 12/18/23 0600   PRN Meds:.acetaminophen **OR** acetaminophen, bisacodyl, glycopyrrolate, levalbuterol, lip balm, liver oil-zinc oxide, ondansetron **OR** ondansetron (ZOFRAN) IV, mouth rinse, polyethylene glycol, senna, sodium chloride flush   Critical care provider statement:   Total critical care time: 33 minutes   Performed by: Karna Christmas MD   Critical care time was exclusive of separately billable procedures and treating other patients.   Critical care was necessary to treat or prevent imminent or life-threatening deterioration.   Critical care was time spent personally by me on the following activities: development of treatment plan with patient and/or surrogate as well as nursing, discussions with consultants, evaluation of patient's response to treatment, examination of patient, obtaining history from patient or surrogate, ordering and performing treatments and interventions, ordering and review of laboratory studies, ordering and review of radiographic studies, pulse oximetry and re-evaluation of patient's condition.    Vida Rigger, M.D.  Pulmonary & Critical Care Medicine

## 2023-12-18 NOTE — TOC Progression Note (Signed)
 Transition of Care [facility]) - Progression Note    Patient Details  Name: Alex [name] MRN: [id number] Date of Birth: [birth year]  [facility] of [facility] CM/SW Contact  Margarito Liner, LCSW Phone Number: 12/18/2023, 12:50 PM  Clinical Narrative: CSW left voicemail for admissions coordinator at [facility].    Expected Discharge Plan: Skilled Nursing Facility Barriers to Discharge: Continued Medical Work up  Expected Discharge Plan and Services       Living arrangements for the past 2 months: Skilled Nursing Facility                                       Social Determinants of Health (SDOH) Interventions SDOH Screenings   Food Insecurity: Patient Unable To Answer (11/20/2023)  Housing: Patient Unable To Answer (11/20/2023)  Transportation Needs: Patient Unable To Answer (11/20/2023)  Utilities: Patient Unable To Answer (11/20/2023)  Financial Resource Strain: Low Risk  (12/02/2021)   Received from [facility]  Tobacco Use: Low Risk  (11/19/2023)    Readmission Risk Interventions     No data to display

## 2023-12-19 DIAGNOSIS — I9589 Other hypotension: Secondary | ICD-10-CM | POA: Diagnosis not present

## 2023-12-19 DIAGNOSIS — R000 Tachycardia, unspecified: Secondary | ICD-10-CM | POA: Diagnosis not present

## 2023-12-19 DIAGNOSIS — A419 Sepsis, unspecified organism: Secondary | ICD-10-CM | POA: Diagnosis not present

## 2023-12-19 DIAGNOSIS — I959 Hypotension, unspecified: Secondary | ICD-10-CM

## 2023-12-19 DIAGNOSIS — D75838 Other thrombocytosis: Secondary | ICD-10-CM

## 2023-12-19 DIAGNOSIS — G9341 Metabolic encephalopathy: Secondary | ICD-10-CM | POA: Diagnosis not present

## 2023-12-19 LAB — CBC
HCT: 32.3 % — ABNORMAL LOW (ref 39.0–52.0)
Hemoglobin: 10.5 g/dL — ABNORMAL LOW (ref 13.0–17.0)
MCH: 30.7 pg (ref 26.0–34.0)
MCHC: 32.5 g/dL (ref 30.0–36.0)
MCV: 94.4 fL (ref 80.0–100.0)
Platelets: 482 10*3/uL — ABNORMAL HIGH (ref 150–400)
RBC: 3.42 MIL/uL — ABNORMAL LOW (ref 4.22–5.81)
RDW: 12.9 % (ref 11.5–15.5)
WBC: 6 10*3/uL (ref 4.0–10.5)
nRBC: 0 % (ref 0.0–0.2)

## 2023-12-19 LAB — CULTURE, RESPIRATORY W GRAM STAIN

## 2023-12-19 LAB — BASIC METABOLIC PANEL
Anion gap: 7 (ref 5–15)
BUN: 17 mg/dL (ref 6–20)
CO2: 28 mmol/L (ref 22–32)
Calcium: 9.1 mg/dL (ref 8.9–10.3)
Chloride: 104 mmol/L (ref 98–111)
Creatinine, Ser: 0.42 mg/dL — ABNORMAL LOW (ref 0.61–1.24)
GFR, Estimated: >60 mL/min (ref >60)
Glucose, Bld: 106 mg/dL — ABNORMAL HIGH (ref 70–99)
Potassium: 4 mmol/L (ref 3.5–5.1)
Sodium: 139 mmol/L (ref 135–145)

## 2023-12-19 LAB — PHOSPHORUS: Phosphorus: 4.1 mg/dL (ref 2.5–4.6)

## 2023-12-19 LAB — GLUCOSE, CAPILLARY
Glucose-Capillary: 114 mg/dL — ABNORMAL HIGH (ref 70–99)
Glucose-Capillary: 119 mg/dL — ABNORMAL HIGH (ref 70–99)

## 2023-12-19 LAB — MAGNESIUM: Magnesium: 2.1 mg/dL (ref 1.7–2.4)

## 2023-12-19 MED ORDER — METOPROLOL TARTRATE 5 MG/5ML IV SOLN
5.0000 mg | INTRAVENOUS | Status: DC | PRN
  Administered 2023-12-19 – 2023-12-26 (×4): 5 mg via INTRAVENOUS
  Filled 2023-12-19 (×5): qty 5

## 2023-12-19 MED ORDER — SODIUM CHLORIDE 0.9 % IV BOLUS
500.0000 mL | Freq: Once | INTRAVENOUS | Status: AC
  Administered 2023-12-19: 500 mL via INTRAVENOUS

## 2023-12-19 MED ORDER — DILTIAZEM 12 MG/ML ORAL SUSPENSION
30.0000 mg | Freq: Four times a day (QID) | ORAL | Status: DC
  Filled 2023-12-19 (×2): qty 2.5

## 2023-12-19 MED ORDER — MIDODRINE HCL 5 MG PO TABS
15.0000 mg | ORAL_TABLET | Freq: Three times a day (TID) | ORAL | Status: DC
  Administered 2023-12-20 – 2024-02-14 (×166): 15 mg
  Administered 2024-02-15: 5 mg
  Filled 2023-12-19 (×173): qty 3

## 2023-12-19 MED ORDER — DILTIAZEM HCL 25 MG/5ML IV SOLN
10.0000 mg | INTRAVENOUS | Status: DC | PRN
  Administered 2023-12-19: 10 mg via INTRAVENOUS
  Filled 2023-12-19: qty 5

## 2023-12-19 MED ORDER — DILTIAZEM HCL 30 MG PO TABS
30.0000 mg | ORAL_TABLET | Freq: Four times a day (QID) | ORAL | Status: DC
Start: 2023-12-19 — End: 2024-03-27
  Administered 2023-12-19 – 2024-03-27 (×367): 30 mg
  Filled 2023-12-19 (×373): qty 1

## 2023-12-19 NOTE — Plan of Care (Signed)
  Problem: Clinical Measurements: Goal: Respiratory complications will improve Outcome: Progressing   Problem: Nutrition: Goal: Adequate nutrition will be maintained Outcome: Progressing   Problem: Coping: Goal: Level of anxiety will decrease Outcome: Progressing   

## 2023-12-19 NOTE — Assessment & Plan Note (Addendum)
 02-02-2024 Patient intubated during the hospital course.  Tracheostomy on 12/07/2023.  This morning was on room air.

## 2023-12-19 NOTE — Assessment & Plan Note (Addendum)
 Last platelet count in normal range

## 2023-12-19 NOTE — Assessment & Plan Note (Addendum)
 Midodrine  discontinued on 5/2.  Patient is on metoprolol  and Cardizem .  Patient on decreased dose of metoprolol  12.5 mg twice a day and Cardizem  30 mg every 8 hours.  Heart rate and blood pressure acceptable.

## 2023-12-19 NOTE — Progress Notes (Signed)
   12/19/23 1814  Assess: MEWS Score  ECG Heart Rate (!) 144  Resp 15  Assess: MEWS Score  MEWS Temp 0  MEWS Systolic 1  MEWS Pulse 3  MEWS RR 0  MEWS LOC 0  MEWS Score 4  MEWS Score Color Red  Assess: if the MEWS score is Yellow or Red  Were vital signs accurate and taken at a resting state? Yes  Does the patient meet 2 or more of the SIRS criteria? No  Does the patient have a confirmed or suspected source of infection? Yes  MEWS guidelines implemented  Yes, red  Treat  MEWS Interventions Considered administering scheduled or prn medications/treatments as ordered  Take Vital Signs  Increase Vital Sign Frequency  Red: Q1hr x2, continue Q4hrs until patient remains green for 12hrs  Escalate  MEWS: Escalate Red: Discuss with charge nurse and notify provider. Consider notifying RRT. If remains red for 2 hours consider need for higher level of care  Notify: Charge Nurse/RN  Name of Charge Nurse/RN Notified Metro Kung, RN  Provider Notification  Provider Name/Title Dr. Renae Gloss  Date Provider Notified 12/19/23  Time Provider Notified 1810  Method of Notification Page  Notification Reason Other (Comment)  Provider response See new orders  Date of Provider Response 12/19/23  Time of Provider Response 1819  Notify: Rapid Response  Name of Rapid Response RN Notified none per MD  Date Rapid Response Notified 12/19/23  Time Rapid Response Notified 1819  Assess: SIRS CRITERIA  SIRS Temperature  0  SIRS Respirations  0  SIRS Pulse 1  SIRS WBC 0  SIRS Score Sum  1    Per Dr. Renae Gloss.  PRN med orders placed for increased heart rate.

## 2023-12-19 NOTE — Assessment & Plan Note (Addendum)
 02-02-2024 resolved. Continue Cardizem and metoprolol.

## 2023-12-19 NOTE — Assessment & Plan Note (Addendum)
 02-02-2024 resolved. Patient is alert.  Patient able to move his right arm.

## 2023-12-19 NOTE — Assessment & Plan Note (Addendum)
Present on admission.  See full description below.
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
## 2023-12-20 DIAGNOSIS — A419 Sepsis, unspecified organism: Secondary | ICD-10-CM | POA: Diagnosis not present

## 2023-12-20 DIAGNOSIS — I9589 Other hypotension: Secondary | ICD-10-CM | POA: Diagnosis not present

## 2023-12-20 DIAGNOSIS — R000 Tachycardia, unspecified: Secondary | ICD-10-CM | POA: Diagnosis not present

## 2023-12-20 DIAGNOSIS — Z87820 Personal history of traumatic brain injury: Secondary | ICD-10-CM

## 2023-12-20 DIAGNOSIS — G9341 Metabolic encephalopathy: Secondary | ICD-10-CM | POA: Diagnosis not present

## 2023-12-20 LAB — GLUCOSE, CAPILLARY
Glucose-Capillary: 118 mg/dL — ABNORMAL HIGH (ref 70–99)
Glucose-Capillary: 127 mg/dL — ABNORMAL HIGH (ref 70–99)

## 2023-12-20 MED ORDER — METOPROLOL TARTRATE 25 MG PO TABS
12.5000 mg | ORAL_TABLET | Freq: Two times a day (BID) | ORAL | Status: DC
  Administered 2023-12-20 – 2023-12-21 (×3): 12.5 mg
  Filled 2023-12-20 (×3): qty 1
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
## 2023-12-20 NOTE — Plan of Care (Signed)
  Problem: Fluid Volume: Goal: Hemodynamic stability will improve Outcome: Progressing   Problem: Clinical Measurements: Goal: Diagnostic test results will improve Outcome: Progressing Goal: Signs and symptoms of infection will decrease Outcome: Progressing   Problem: Respiratory: Goal: Ability to maintain adequate ventilation will improve Outcome: Progressing   Problem: Activity: Goal: Ability to tolerate increased activity will improve Outcome: Progressing   Problem: Respiratory: Goal: Ability to maintain a clear airway and adequate ventilation will improve Outcome: Progressing   Problem: Role Relationship: Goal: Method of communication will improve Outcome: Progressing   Problem: Education: Goal: Knowledge of General Education information will improve Description: Including pain rating scale, medication(s)/side effects and non-pharmacologic comfort measures Outcome: Progressing   Problem: Health Behavior/Discharge Planning: Goal: Ability to manage health-related needs will improve Outcome: Progressing   Problem: Clinical Measurements: Goal: Ability to maintain clinical measurements within normal limits will improve Outcome: Progressing Goal: Will remain free from infection Outcome: Progressing Goal: Diagnostic test results will improve Outcome: Progressing Goal: Respiratory complications will improve Outcome: Progressing Goal: Cardiovascular complication will be avoided Outcome: Progressing   Problem: Activity: Goal: Risk for activity intolerance will decrease Outcome: Progressing   Problem: Nutrition: Goal: Adequate nutrition will be maintained Outcome: Progressing   Problem: Coping: Goal: Level of anxiety will decrease Outcome: Progressing   Problem: Elimination: Goal: Will not experience complications related to bowel motility Outcome: Progressing Goal: Will not experience complications related to urinary retention Outcome: Progressing   Problem:  Pain Managment: Goal: General experience of comfort will improve and/or be controlled Outcome: Progressing   Problem: Safety: Goal: Ability to remain free from injury will improve Outcome: Progressing   Problem: Skin Integrity: Goal: Risk for impaired skin integrity will decrease Outcome: Progressing   Problem: Activity: Goal: Ability to tolerate increased activity will improve Outcome: Progressing   Problem: Respiratory: Goal: Ability to maintain a clear airway and adequate ventilation will improve Outcome: Progressing   Problem: Role Relationship: Goal: Method of communication will improve Outcome: Progressing

## 2023-12-21 DIAGNOSIS — A419 Sepsis, unspecified organism: Secondary | ICD-10-CM | POA: Diagnosis not present

## 2023-12-21 DIAGNOSIS — G9341 Metabolic encephalopathy: Secondary | ICD-10-CM | POA: Diagnosis not present

## 2023-12-21 DIAGNOSIS — I9589 Other hypotension: Secondary | ICD-10-CM | POA: Diagnosis not present

## 2023-12-21 DIAGNOSIS — R000 Tachycardia, unspecified: Secondary | ICD-10-CM | POA: Diagnosis not present

## 2023-12-21 LAB — BASIC METABOLIC PANEL
Anion gap: 9 (ref 5–15)
BUN: 20 mg/dL (ref 6–20)
CO2: 26 mmol/L (ref 22–32)
Calcium: 9.3 mg/dL (ref 8.9–10.3)
Chloride: 101 mmol/L (ref 98–111)
Creatinine, Ser: 0.47 mg/dL — ABNORMAL LOW (ref 0.61–1.24)
GFR, Estimated: >60 mL/min (ref >60)
Glucose, Bld: 125 mg/dL — ABNORMAL HIGH (ref 70–99)
Potassium: 3.9 mmol/L (ref 3.5–5.1)
Sodium: 136 mmol/L (ref 135–145)

## 2023-12-21 LAB — CBC
HCT: 34.1 % — ABNORMAL LOW (ref 39.0–52.0)
Hemoglobin: 10.8 g/dL — ABNORMAL LOW (ref 13.0–17.0)
MCH: 30.7 pg (ref 26.0–34.0)
MCHC: 31.7 g/dL (ref 30.0–36.0)
MCV: 96.9 fL (ref 80.0–100.0)
Platelets: 499 10*3/uL — ABNORMAL HIGH (ref 150–400)
RBC: 3.52 MIL/uL — ABNORMAL LOW (ref 4.22–5.81)
RDW: 13 % (ref 11.5–15.5)
WBC: 5 10*3/uL (ref 4.0–10.5)
nRBC: 0 % (ref 0.0–0.2)

## 2023-12-21 LAB — GLUCOSE, CAPILLARY
Glucose-Capillary: 117 mg/dL — ABNORMAL HIGH (ref 70–99)
Glucose-Capillary: 99 mg/dL (ref 70–99)
Glucose-Capillary: 93 mg/dL (ref 70–99)
Glucose-Capillary: 132 mg/dL — ABNORMAL HIGH (ref 70–99)
Glucose-Capillary: 96 mg/dL (ref 70–99)

## 2023-12-21 MED ORDER — METOPROLOL TARTRATE 25 MG PO TABS
25.0000 mg | ORAL_TABLET | Freq: Two times a day (BID) | ORAL | Status: DC
  Administered 2023-12-21: 12.5 mg
  Administered 2023-12-21 – 2024-03-28 (×184): 25 mg
  Filled 2023-12-21 (×190): qty 1

## 2023-12-21 MED ORDER — LOPERAMIDE HCL 2 MG PO CAPS
2.0000 mg | ORAL_CAPSULE | Freq: Three times a day (TID) | ORAL | Status: DC | PRN
  Administered 2023-12-21 – 2024-05-17 (×20): 2 mg via ORAL
  Filled 2023-12-21 (×21): qty 1

## 2023-12-21 NOTE — Progress Notes (Signed)
 Progress Note   Patient: Alex [name] WUJ:[id number] DOB: Mar 14, 1996 DOA: 11/19/2023     32 DOS: the patient was seen and examined on 12/21/2023   Brief hospital course: 28 yo M presenting to [facility] ED from outpatient rehab on 11/19/23 for evaluation of altered mental status.   History obtained per chart review and mother's telephone interview, patient unable to participate in interview due to respiratory distress and baseline TBI. 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 He was treated at [facility] for 5 months then discharged to rehab. Per his mother and legal guardian his baseline is: Non verbal but he will yell out sporadic nonsensical words with left sided paralysis. He is able to move his RUE in order to feed himself with finger foods and he has some involuntary movement with that arm, he will swat at you. Similar baseline function with RLE, he can kick and move, but often will lay it bent and to the side. He has enough strength to even try and get out of bed on the right side. She denies any issues with swallowing, but eats mostly soft food. If he is not watched closely with eating he will try to eat all the food at once. The facility staff reported he was at his normal baseline until lunchtime on 11/19/23. It was observed he was more somnolent and not interactive. Staff noted a cough and slightly increased work of breathing. Mom also confirmed noting a congested cough the last time she visited earlier in the week. Staff and mom denied nausea/ vomiting, but mom reports chronic loose stools.   EMS reported the patient febrile and tachycardic on arrival.   ED course: Upon arrival patient tachycardic and lethargic. Sepsis protocol initiated with antibiotics and IVF resuscitation. Labs significant for mild hypokalemia, otherwise WNL. Initial imaging unremarkable but then patient became hypoxic with SpO2 85% on RA and a CTa was obtained, negative for PE but concerning for  aspiration pneumonia.  11/20/23: Admit to ICU due to acute hypoxic respiratory failure requiring urgent intubation and mechanical ventilatory support secondary to suspected aspiration and pneumonia 11/21/23- patient moving RUE, mother at bedside we reviewed medical plan. He remains on 64mcg/kg/hr levophed, today plan to rescusitate more aggresively and wean from levophed and potentially extubate post SBP.   He is febrile this am.  He is on zithromax, unasyn 11/22/23- s/p bronch yesterday with aspiration of mucus plugging.  Today resp status improved with liberation protocol in proces. SLP post extubation today. 11/23/23- patient is +for pseudomonas resp cultures.  He is also MRSA pcr +, refined therapy during rounds with pharmacist. He remains on MV 11/24/23- patient is still on vasopressor support weaning down on MV.  Secretions are slightly better.  11/25/23- patient weaned off levophed, failed SBT with tachypnea, tachycardia.  Secretions much improved. 11/26/23- on minimal vent support, unable to perform SBT due to copious secretions from ETT.   11/27/23- on minimal vent support, secretions improved.  Change Doxycycline to Vancomycin.  Plan for SBT as tolerated. 11/28/23-Pt successfully extubated 02/4, currently tolerating HHFNC @35L /35%.  Required low dose precedex overnight to allow suctioning due to excessive secretions. Pt with suspected seizure activity developed tremors in the right upper extremity and became minimally responsive.  Received 1g of iv keppra.   11/29/23-Overnight pt developed sinus tachycardia/svt 140 to 160's improved following 2.5 mg iv metoprolol.  Tolerating RA with no signs of respiratory distress.  Transferring to progressive care unit TRH to pick  on 02/7 12/01/23: Pt transferred back to ICU with severe acute hypoxic respiratory failure initially placed on HHFNC but due to significant hypoxia O2 sats in the 60's he required reintubation and underwent emergent bronchoscopy which revealed thick  secretions in the LUL and LLL resulting in mucous plugging. Therapeutic aspiration performed. CXR showed collapse of the LUL secondary to mucus plugging  12/03/23: Pt remains mechanically intubated on minimal vent settings.  ENT consulted for tracheostomy placement due to recurrent respiratory failure due to inability to clear secretions.  Requiring levophed gtt to maintain map 65 or higher  12/04/23: No acute events overnight on minimal settings pending tracheostomy placement  12/05/23: No acute events overnight, on minimal vent support. Initially on low dose Levophed, now weaned off.  IR placed PEG tube. Pending Tracheostomy placement on Friday. 12/06/23: No acute events overnight, on minimal vent support. Awaiting Tracheostomy placement tomorrow. 12/07/23: No acute events overnight.  Tracheostomy placed this morning by ENT, no events with procedure. Will keep sedated today with newly placed Trach with plan for WUA/SBT tomorrow. 12/08/23: All sedation turned off for WUA will perform SBT or TCT as tolerated.  Pt spike at temp 100.9 F, hypotensive requiring levophed gtt, and tachycardic concerning for possible sepsis.  Blood culture/tracheal aspirate and UA sent.  Restarted broad spectrum abx (vancomycin/zosyn) 12/09/23: Pt no longer requiring levophed gtt.  Tmax 102.4 F 12/10/23: Central Line Removed, not requiring pressors or sedation. Tolearting TC. 12/11/23: Overnight was started back on Gabapentin and scheduled Propanol for possible neurostorming with significant improvement in HR from the 170's to mid 100's 12/12/23: HR remains stable in low 100's, not requiring vasopressors, on minimal vent support, SBT as tolerated.  Complete course of ABX as suspect he is colonized with Pseudomonas and MRSA. 12/13/23: Overnight with concern for possible tube feeds in ETT tubing. Propanol was decreased due to soft BP. CXR this morning without new infiltrate, no leukocytosis or fever, and bowel pattern normal on KUB, will  restart tube feeds. Remains on minimal vent support, execise in PSV as tolerated ~ unable to tolerate less than 10/5 in PSV. 12/14/23: No significant events overnight.  Afebrile, hemodynamically stable.  On minimal vent support, continue with SBT/TCT as tolerated. 12/15/23: No change in patient's condition.  Remained on full vent support overnight.  Now on pressure support and tolerating. 12/16/23: Tolerated trach collar trials 12/17/23- patient awaiting placement.  He may have had another seizure and we will order EEG for him today.  He may not leave due to insurance qualification with trache.  12/18/23- Patient with no acute events overnight. Labs including cbc and bmp reviewed with no significant new findings.  12/19/2023.  Transferred to medical service.  Will transfer out of ICU.  Currently on trach collar 28% around 6 L flow.  Saturating well.  Started on Cardizem to control heart rate 2/27.  Increase midodrine to 15 mg 3 times daily.  Added metoprolol to control heart rate 2/28.  Titrating metoprolol to try to control heart rate.   Assessment and Plan: * Hypotension Continue increased dose of midodrine 15 mg 3 times daily.  Tachycardia Continue Cardizem and metoprolol increased dose today to try to control heart rate better.  Septic shock (HCC) Completed antibiotics.  Still on midodrine for blood pressure.  Continue to watch temperature curve.  White blood cell count normal range.  Acute metabolic encephalopathy Patient is alert.  Patient able to move his right arm.  Aspiration pneumonia (HCC) Completed antibiotics.  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  Completed course of Zyvox and Zosyn.  Hypokalemia Replaced  Seizure disorder (HCC) Patient on valproic acid, gabapentin.  Recent EEG did not show any seizure activity.  History of traumatic brain injury Patient is bedbound.  But he is able to move his right arm.  Reactive thrombocytosis Last platelet count  499  Pressure injury of skin Present on admission.  See full description below.  Acute hypoxic respiratory failure [facility]) Patient intubated during the hospital course.  Tracheostomy on 12/07/2023.  Now on trach collar.        Subjective: Patient able to move his right arm very well.  Admitted 32 days ago with altered mental status and sepsis with pneumonia.  Physical Exam: Vitals:   12/21/23 0809 12/21/23 1300 12/21/23 1337 12/21/23 1457  BP: 109/69  103/67 97/61  Pulse: (!) 123  (!) 111 (!) 105  Resp:      Temp: 99.1 F (37.3 C)  99.1 F (37.3 C) 99.8 F (37.7 C)  TempSrc: Oral  Oral Oral  SpO2: 100%  95% 100%  Weight:  68.9 kg    Height:       Physical Exam HENT:     Head: Normocephalic.  Eyes:     General: Lids are normal.     Conjunctiva/sclera: Conjunctivae normal.  Cardiovascular:     Rate and Rhythm: Regular rhythm. Tachycardia present.     Heart sounds: Normal heart sounds, S1 normal and S2 normal.  Pulmonary:     Breath sounds: No decreased breath sounds, wheezing, rhonchi or rales.  Abdominal:     Palpations: Abdomen is soft.     Tenderness: There is no abdominal tenderness.  Musculoskeletal:     Right lower leg: No swelling.     Left lower leg: No swelling.     Comments: Left arm contracted  Skin:    General: Skin is warm.     Findings: No rash.  Neurological:     Mental Status: He is alert.     Comments: Patient able to move his right hand and arm.     Data Reviewed: Creatinine 0.47, electrolytes normal, platelet count 499, hemoglobin 10.8, white blood count 5.0  Family Communication: Left message for mother  Disposition: Status is: Inpatient Remains inpatient appropriate because: TOC looking into options  Planned Discharge Destination: Rehab    Time spent: 27 minutes  Author: Alford Highland, MD 12/21/2023 4:06 PM  For on call review [email or web address].

## 2023-12-21 NOTE — TOC Progression Note (Addendum)
 Transition of Care [facility]) - Progression Note    Patient Details  Name: Alex [name] MRN: [id number] Date of Birth: [birth year]  Transition of [facility]) CM/SW Contact  Truddie Hidden, RN Phone Number: 12/21/2023, 10:06 AM  Clinical Narrative:   Attempt to reach admissions at [facility]. Left message regarding admissions acceptance and requested return call.   Spoke with Kenney Houseman in admissions at [facility] regarding patient's return. She will check to see if patient can come back.   10:49am Spoke with Urban Gibson at [facility]. She is requesting face sheet be faxed to [phone]. Face sheet faxed.   Expected Discharge Plan: Skilled Nursing Facility Barriers to Discharge: Continued Medical Work up  Expected Discharge Plan and Services       Living arrangements for the past 2 months: Skilled Nursing Facility                                       Social Determinants of Health (SDOH) Interventions SDOH Screenings   Food Insecurity: Patient Unable To Answer (11/20/2023)  Housing: Patient Unable To Answer (11/20/2023)  Transportation Needs: Patient Unable To Answer (11/20/2023)  Utilities: Patient Unable To Answer (11/20/2023)  Financial Resource Strain: Low Risk  (12/02/2021)   Received from [facility]  Tobacco Use: Low Risk  (11/19/2023)    Readmission Risk Interventions     No data to display

## 2023-12-21 NOTE — Plan of Care (Signed)
  Problem: Fluid Volume: Goal: Hemodynamic stability will improve Outcome: Progressing   Problem: Clinical Measurements: Goal: Diagnostic test results will improve Outcome: Progressing Goal: Signs and symptoms of infection will decrease Outcome: Progressing   Problem: Respiratory: Goal: Ability to maintain adequate ventilation will improve Outcome: Progressing   Problem: Activity: Goal: Ability to tolerate increased activity will improve Outcome: Progressing   Problem: Respiratory: Goal: Ability to maintain a clear airway and adequate ventilation will improve Outcome: Progressing   Problem: Role Relationship: Goal: Method of communication will improve Outcome: Progressing   Problem: Education: Goal: Knowledge of General Education information will improve Description: Including pain rating scale, medication(s)/side effects and non-pharmacologic comfort measures Outcome: Progressing   Problem: Health Behavior/Discharge Planning: Goal: Ability to manage health-related needs will improve Outcome: Progressing   Problem: Clinical Measurements: Goal: Ability to maintain clinical measurements within normal limits will improve Outcome: Progressing Goal: Will remain free from infection Outcome: Progressing Goal: Diagnostic test results will improve Outcome: Progressing Goal: Respiratory complications will improve Outcome: Progressing Goal: Cardiovascular complication will be avoided Outcome: Progressing   Problem: Activity: Goal: Risk for activity intolerance will decrease Outcome: Progressing   Problem: Nutrition: Goal: Adequate nutrition will be maintained Outcome: Progressing   Problem: Coping: Goal: Level of anxiety will decrease Outcome: Progressing   Problem: Elimination: Goal: Will not experience complications related to bowel motility Outcome: Progressing Goal: Will not experience complications related to urinary retention Outcome: Progressing   Problem:  Pain Managment: Goal: General experience of comfort will improve and/or be controlled Outcome: Progressing   Problem: Safety: Goal: Ability to remain free from injury will improve Outcome: Progressing   Problem: Skin Integrity: Goal: Risk for impaired skin integrity will decrease Outcome: Progressing   Problem: Activity: Goal: Ability to tolerate increased activity will improve Outcome: Progressing   Problem: Respiratory: Goal: Ability to maintain a clear airway and adequate ventilation will improve Outcome: Progressing   Problem: Role Relationship: Goal: Method of communication will improve Outcome: Progressing

## 2023-12-22 DIAGNOSIS — I959 Hypotension, unspecified: Secondary | ICD-10-CM | POA: Diagnosis not present

## 2023-12-22 LAB — GLUCOSE, CAPILLARY
Glucose-Capillary: 100 mg/dL — ABNORMAL HIGH (ref 70–99)
Glucose-Capillary: 120 mg/dL — ABNORMAL HIGH (ref 70–99)

## 2023-12-22 MED ORDER — MORPHINE SULFATE (PF) 2 MG/ML IV SOLN
2.0000 mg | INTRAVENOUS | Status: DC | PRN
  Administered 2023-12-22: 2 mg via INTRAVENOUS
  Filled 2023-12-22 (×2): qty 1

## 2023-12-22 NOTE — Progress Notes (Signed)
 Progress Note   Patient: Alex [name] ZOX:[id number] DOB: [birth year] DOA: 11/19/2023     28 DOS: the patient was seen and examined on 12/22/2023     Brief hospital course: 28 yo M presenting to [facility] ED from outpatient rehab on 11/19/23 for evaluation of altered mental status.   History obtained per chart review and mother's telephone interview, patient unable to participate in interview due to respiratory distress and baseline TBI. 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 He was treated at [facility] for 5 months then discharged to rehab. Per his mother and legal guardian his baseline is: Non verbal but he will yell out sporadic nonsensical words with left sided paralysis. He is able to move his RUE in order to feed himself with finger foods and he has some involuntary movement with that arm, he will swat at you. Similar baseline function with RLE, he can kick and move, but often will lay it bent and to the side. He has enough strength to even try and get out of bed on the right side. She denies any issues with swallowing, but eats mostly soft food. If he is not watched closely with eating he will try to eat all the food at once. The facility staff reported he was at his normal baseline until lunchtime on 11/19/23. It was observed he was more somnolent and not interactive. Staff noted a cough and slightly increased work of breathing. Mom also confirmed noting a congested cough the last time she visited earlier in the week. Staff and mom denied nausea/ vomiting, but mom reports chronic loose stools.   EMS reported the patient febrile and tachycardic on arrival.   ED course: Upon arrival patient tachycardic and lethargic. Sepsis protocol initiated with antibiotics and IVF resuscitation. Labs significant for mild hypokalemia, otherwise WNL. Initial imaging unremarkable but then patient became hypoxic with SpO2 85% on RA and a CTa was obtained, negative for PE but concerning for  aspiration pneumonia.   11/20/23: Admit to ICU due to acute hypoxic respiratory failure requiring urgent intubation and mechanical ventilatory support secondary to suspected aspiration and pneumonia 11/21/23- patient moving RUE, mother at bedside we reviewed medical plan. He remains on 21mcg/kg/hr levophed, today plan to rescusitate more aggresively and wean from levophed and potentially extubate post SBP.   He is febrile this am.  He is on zithromax, unasyn 11/22/23- s/p bronch yesterday with aspiration of mucus plugging.  Today resp status improved with liberation protocol in proces. SLP post extubation today. 11/23/23- patient is +for pseudomonas resp cultures.  He is also MRSA pcr +, refined therapy during rounds with pharmacist. He remains on MV 11/24/23- patient is still on vasopressor support weaning down on MV.  Secretions are slightly better.  11/25/23- patient weaned off levophed, failed SBT with tachypnea, tachycardia.  Secretions much improved. 11/26/23- on minimal vent support, unable to perform SBT due to copious secretions from ETT.   11/27/23- on minimal vent support, secretions improved.  Change Doxycycline to Vancomycin.  Plan for SBT as tolerated. 11/28/23-Pt successfully extubated 02/4, currently tolerating HHFNC @35L /35%.  Required low dose precedex overnight to allow suctioning due to excessive secretions. Pt with suspected seizure activity developed tremors in the right upper extremity and became minimally responsive.  Received 1g of iv keppra.   11/29/23-Overnight pt developed sinus tachycardia/svt 140 to 160's improved following 2.5 mg iv metoprolol.  Tolerating RA with no signs of respiratory distress.  Transferring to progressive care unit  TRH to pick on 02/7 12/01/23: Pt transferred back to ICU with severe acute hypoxic respiratory failure initially placed on HHFNC but due to significant hypoxia O2 sats in the 60's he required reintubation and underwent emergent bronchoscopy which revealed  thick secretions in the LUL and LLL resulting in mucous plugging. Therapeutic aspiration performed. CXR showed collapse of the LUL secondary to mucus plugging  12/03/23: Pt remains mechanically intubated on minimal vent settings.  ENT consulted for tracheostomy placement due to recurrent respiratory failure due to inability to clear secretions.  Requiring levophed gtt to maintain map 65 or higher  12/04/23: No acute events overnight on minimal settings pending tracheostomy placement  12/05/23: No acute events overnight, on minimal vent support. Initially on low dose Levophed, now weaned off.  IR placed PEG tube. Pending Tracheostomy placement on Friday. 12/06/23: No acute events overnight, on minimal vent support. Awaiting Tracheostomy placement tomorrow. 12/07/23: No acute events overnight.  Tracheostomy placed this morning by ENT, no events with procedure. Will keep sedated today with newly placed Trach with plan for WUA/SBT tomorrow. 12/08/23: All sedation turned off for WUA will perform SBT or TCT as tolerated.  Pt spike at temp 100.9 F, hypotensive requiring levophed gtt, and tachycardic concerning for possible sepsis.  Blood culture/tracheal aspirate and UA sent.  Restarted broad spectrum abx (vancomycin/zosyn) 12/09/23: Pt no longer requiring levophed gtt.  Tmax 102.4 F 12/10/23: Central Line Removed, not requiring pressors or sedation. Tolearting TC. 12/11/23: Overnight was started back on Gabapentin and scheduled Propanol for possible neurostorming with significant improvement in HR from the 170's to mid 100's 12/12/23: HR remains stable in low 100's, not requiring vasopressors, on minimal vent support, SBT as tolerated.  Complete course of ABX as suspect he is colonized with Pseudomonas and MRSA. 12/13/23: Overnight with concern for possible tube feeds in ETT tubing. Propanol was decreased due to soft BP. CXR this morning without new infiltrate, no leukocytosis or fever, and bowel pattern normal on KUB,  will restart tube feeds. Remains on minimal vent support, execise in PSV as tolerated ~ unable to tolerate less than 10/5 in PSV. 12/14/23: No significant events overnight.  Afebrile, hemodynamically stable.  On minimal vent support, continue with SBT/TCT as tolerated. 12/15/23: No change in patient's condition.  Remained on full vent support overnight.  Now on pressure support and tolerating. 12/16/23: Tolerated trach collar trials 12/17/23- patient awaiting placement.  He may have had another seizure and we will order EEG for him today.  He may not leave due to insurance qualification with trache.       Assessment and Plan: * Hypotension Continue increased dose of midodrine 15 mg 3 times daily.   Tachycardia Continue Cardizem and metoprolol increased dose today to try to control heart rate better.   Septic shock (HCC) Completed antibiotics.  Still on midodrine for blood pressure.  Continue to watch temperature curve.  White blood cell count normal range.   Acute metabolic encephalopathy Patient is alert.  Patient able to move his right arm.   Aspiration pneumonia (HCC) Completed antibiotics.  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  Completed course of Zyvox and Zosyn.   Hypokalemia Replaced   Seizure disorder (HCC) Patient on valproic acid, gabapentin.  Recent EEG did not show any seizure activity.   History of traumatic brain injury Patient is bedbound.  But he is able to move his right arm.   Reactive thrombocytosis Last platelet count 499   Pressure injury of skin Present on admission.  See full description below.   Acute hypoxic respiratory failure [facility]) Patient intubated during the hospital course.  Tracheostomy on 12/07/2023.  Now on trach collar.     Subjective:   Patient seen and examined at bedside this morning Unable to communicate as his baseline due to TBI Moving the right upper extremities Unable to provide subjective information given above    Physical Exam: HENT:     Head: Normocephalic.  Eyes:     General: Lids are normal.     Conjunctiva/sclera: Conjunctivae normal.  Cardiovascular:     Rate and Rhythm: Regular rhythm. Tachycardia present.     Heart sounds: Normal heart sounds, S1 normal and S2 normal.  Pulmonary:     Breath sounds: No decreased breath sounds, wheezing, rhonchi or rales.  Abdominal:     Palpations: Abdomen is soft.     Tenderness: There is no abdominal tenderness.  Musculoskeletal:     Right lower leg: No swelling.     Left lower leg: No swelling.     Comments: Left arm contracted  Skin:    General: Skin is warm.     Findings: No rash.  Neurological:     Mental Status: He is alert.     Comments: Patient able to move his right hand and arm.      Data Reviewed:    Latest Ref Rng & Units 12/21/2023    4:39 AM 12/19/2023    4:20 AM 12/18/2023    4:23 AM  BMP  Glucose 70 - 99 mg/dL 811  914  95   BUN 6 - 20 mg/dL 20  17  12    Creatinine 0.61 - 1.24 mg/dL 7.82  9.56  2.13   Sodium 135 - 145 mmol/L 136  139  140   Potassium 3.5 - 5.1 mmol/L 3.9  4.0  4.5   Chloride 98 - 111 mmol/L 101  104  99   CO2 22 - 32 mmol/L 26  28  27    Calcium 8.9 - 10.3 mg/dL 9.3  9.1  9.2        Latest Ref Rng & Units 12/21/2023    4:39 AM 12/19/2023    4:20 AM 12/18/2023    4:23 AM  CBC  WBC 4.0 - 10.5 K/uL 5.0  6.0  6.0   Hemoglobin 13.0 - 17.0 g/dL 08.6  57.8  46.9   Hematocrit 39.0 - 52.0 % 34.1  32.3  35.2   Platelets 150 - 400 K/uL 499  482  513       Family Communication: Left message for mother   Disposition: Status is: Inpatient Remains inpatient appropriate because: TOC looking into options  Planned Discharge Destination: Rehab   Vitals:   12/22/23 0030 12/22/23 0500 12/22/23 0946 12/22/23 1250  BP: 108/68  118/78 115/77  Pulse: (!) 111  (!) 102 (!) 107  Resp: 16     Temp: 99 F (37.2 C)   98.8 F (37.1 C)  TempSrc: Axillary   Axillary  SpO2: 95%  92% 95%  Weight:  68.9 kg    Height:          Author: Loyce Dys, MD 12/22/2023 1:14 PM  For on call review [email or web address].

## 2023-12-22 NOTE — Plan of Care (Signed)
  Problem: Clinical Measurements: Goal: Diagnostic test results will improve Outcome: Progressing Goal: Signs and symptoms of infection will decrease Outcome: Progressing   Problem: Respiratory: Goal: Ability to maintain adequate ventilation will improve Outcome: Progressing   Problem: Respiratory: Goal: Ability to maintain a clear airway and adequate ventilation will improve Outcome: Progressing   Problem: Role Relationship: Goal: Method of communication will improve Outcome: Progressing   Problem: Clinical Measurements: Goal: Will remain free from infection Outcome: Progressing   Problem: Nutrition: Goal: Adequate nutrition will be maintained Outcome: Progressing   Problem: Coping: Goal: Level of anxiety will decrease Outcome: Progressing

## 2023-12-23 DIAGNOSIS — J9601 Acute respiratory failure with hypoxia: Secondary | ICD-10-CM | POA: Diagnosis not present

## 2023-12-23 LAB — CBC WITH DIFFERENTIAL/PLATELET
Abs Immature Granulocytes: 0.03 10*3/uL (ref 0.00–0.07)
Basophils Absolute: 0 10*3/uL (ref 0.0–0.1)
Basophils Relative: 0 %
Eosinophils Absolute: 0.5 10*3/uL (ref 0.0–0.5)
Eosinophils Relative: 8 %
HCT: 34.4 % — ABNORMAL LOW (ref 39.0–52.0)
Hemoglobin: 11.3 g/dL — ABNORMAL LOW (ref 13.0–17.0)
Immature Granulocytes: 1 %
Lymphocytes Relative: 21 %
Lymphs Abs: 1.2 10*3/uL (ref 0.7–4.0)
MCH: 31.1 pg (ref 26.0–34.0)
MCHC: 32.8 g/dL (ref 30.0–36.0)
MCV: 94.8 fL (ref 80.0–100.0)
Monocytes Absolute: 0.6 10*3/uL (ref 0.1–1.0)
Monocytes Relative: 9 %
Neutro Abs: 3.7 10*3/uL (ref 1.7–7.7)
Neutrophils Relative %: 61 %
Platelets: 480 10*3/uL — ABNORMAL HIGH (ref 150–400)
RBC: 3.63 MIL/uL — ABNORMAL LOW (ref 4.22–5.81)
RDW: 13 % (ref 11.5–15.5)
WBC: 6 10*3/uL (ref 4.0–10.5)
nRBC: 0 % (ref 0.0–0.2)

## 2023-12-23 LAB — BASIC METABOLIC PANEL
Anion gap: 8 (ref 5–15)
BUN: 16 mg/dL (ref 6–20)
CO2: 26 mmol/L (ref 22–32)
Calcium: 9.2 mg/dL (ref 8.9–10.3)
Chloride: 105 mmol/L (ref 98–111)
Creatinine, Ser: 0.45 mg/dL — ABNORMAL LOW (ref 0.61–1.24)
GFR, Estimated: >60 mL/min (ref >60)
Glucose, Bld: 126 mg/dL — ABNORMAL HIGH (ref 70–99)
Potassium: 4.1 mmol/L (ref 3.5–5.1)
Sodium: 139 mmol/L (ref 135–145)

## 2023-12-23 MED ORDER — ORAL CARE MOUTH RINSE: 15.0000 mL | OROMUCOSAL | Status: DC | PRN
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
## 2023-12-23 NOTE — Plan of Care (Signed)
  Problem: Fluid Volume: Goal: Hemodynamic stability will improve Outcome: Progressing   Problem: Clinical Measurements: Goal: Diagnostic test results will improve Outcome: Progressing Goal: Signs and symptoms of infection will decrease Outcome: Progressing   Problem: Respiratory: Goal: Ability to maintain adequate ventilation will improve Outcome: Progressing   Problem: Activity: Goal: Ability to tolerate increased activity will improve Outcome: Progressing   Problem: Respiratory: Goal: Ability to maintain a clear airway and adequate ventilation will improve Outcome: Progressing   Problem: Role Relationship: Goal: Method of communication will improve Outcome: Progressing   Problem: Education: Goal: Knowledge of General Education information will improve Description: Including pain rating scale, medication(s)/side effects and non-pharmacologic comfort measures Outcome: Progressing   Problem: Health Behavior/Discharge Planning: Goal: Ability to manage health-related needs will improve Outcome: Progressing   Problem: Clinical Measurements: Goal: Ability to maintain clinical measurements within normal limits will improve Outcome: Progressing Goal: Will remain free from infection Outcome: Progressing Goal: Diagnostic test results will improve Outcome: Progressing Goal: Respiratory complications will improve Outcome: Progressing Goal: Cardiovascular complication will be avoided Outcome: Progressing   Problem: Activity: Goal: Risk for activity intolerance will decrease Outcome: Progressing   Problem: Nutrition: Goal: Adequate nutrition will be maintained Outcome: Progressing   Problem: Coping: Goal: Level of anxiety will decrease Outcome: Progressing   Problem: Elimination: Goal: Will not experience complications related to bowel motility Outcome: Progressing Goal: Will not experience complications related to urinary retention Outcome: Progressing   Problem:  Pain Managment: Goal: General experience of comfort will improve and/or be controlled Outcome: Progressing   Problem: Safety: Goal: Ability to remain free from injury will improve Outcome: Progressing   Problem: Skin Integrity: Goal: Risk for impaired skin integrity will decrease Outcome: Progressing   Problem: Activity: Goal: Ability to tolerate increased activity will improve Outcome: Progressing   Problem: Respiratory: Goal: Ability to maintain a clear airway and adequate ventilation will improve Outcome: Progressing   Problem: Role Relationship: Goal: Method of communication will improve Outcome: Progressing

## 2023-12-24 DIAGNOSIS — I959 Hypotension, unspecified: Secondary | ICD-10-CM | POA: Diagnosis not present

## 2023-12-24 LAB — CBC WITH DIFFERENTIAL/PLATELET
Abs Immature Granulocytes: 0.01 10*3/uL (ref 0.00–0.07)
Basophils Absolute: 0 10*3/uL (ref 0.0–0.1)
Basophils Relative: 0 %
Eosinophils Absolute: 0.5 10*3/uL (ref 0.0–0.5)
Eosinophils Relative: 9 %
HCT: 35.1 % — ABNORMAL LOW (ref 39.0–52.0)
Hemoglobin: 11.4 g/dL — ABNORMAL LOW (ref 13.0–17.0)
Immature Granulocytes: 0 %
Lymphocytes Relative: 30 %
Lymphs Abs: 1.5 10*3/uL (ref 0.7–4.0)
MCH: 30.6 pg (ref 26.0–34.0)
MCHC: 32.5 g/dL (ref 30.0–36.0)
MCV: 94.4 fL (ref 80.0–100.0)
Monocytes Absolute: 0.5 10*3/uL (ref 0.1–1.0)
Monocytes Relative: 10 %
Neutro Abs: 2.7 10*3/uL (ref 1.7–7.7)
Neutrophils Relative %: 51 %
Platelets: 466 10*3/uL — ABNORMAL HIGH (ref 150–400)
RBC: 3.72 MIL/uL — ABNORMAL LOW (ref 4.22–5.81)
RDW: 12.9 % (ref 11.5–15.5)
WBC: 5.2 10*3/uL (ref 4.0–10.5)
nRBC: 0 % (ref 0.0–0.2)

## 2023-12-24 LAB — BASIC METABOLIC PANEL
Anion gap: 7 (ref 5–15)
BUN: 16 mg/dL (ref 6–20)
CO2: 28 mmol/L (ref 22–32)
Calcium: 9.2 mg/dL (ref 8.9–10.3)
Chloride: 104 mmol/L (ref 98–111)
Creatinine, Ser: 0.54 mg/dL — ABNORMAL LOW (ref 0.61–1.24)
GFR, Estimated: >60 mL/min (ref >60)
Glucose, Bld: 91 mg/dL (ref 70–99)
Potassium: 4.4 mmol/L (ref 3.5–5.1)
Sodium: 139 mmol/L (ref 135–145)

## 2023-12-24 LAB — GLUCOSE, CAPILLARY: Glucose-Capillary: 138 mg/dL — ABNORMAL HIGH (ref 70–99)

## 2023-12-24 NOTE — Plan of Care (Signed)
  Problem: Fluid Volume: Goal: Hemodynamic stability will improve Outcome: Progressing   Problem: Clinical Measurements: Goal: Diagnostic test results will improve Outcome: Progressing Goal: Signs and symptoms of infection will decrease Outcome: Progressing   Problem: Respiratory: Goal: Ability to maintain adequate ventilation will improve Outcome: Progressing   Problem: Activity: Goal: Ability to tolerate increased activity will improve Outcome: Progressing   Problem: Respiratory: Goal: Ability to maintain a clear airway and adequate ventilation will improve Outcome: Progressing   Problem: Role Relationship: Goal: Method of communication will improve Outcome: Progressing   Problem: Education: Goal: Knowledge of General Education information will improve Description: Including pain rating scale, medication(s)/side effects and non-pharmacologic comfort measures Outcome: Progressing   Problem: Health Behavior/Discharge Planning: Goal: Ability to manage health-related needs will improve Outcome: Progressing   Problem: Clinical Measurements: Goal: Ability to maintain clinical measurements within normal limits will improve Outcome: Progressing Goal: Will remain free from infection Outcome: Progressing Goal: Diagnostic test results will improve Outcome: Progressing Goal: Respiratory complications will improve Outcome: Progressing Goal: Cardiovascular complication will be avoided Outcome: Progressing   Problem: Activity: Goal: Risk for activity intolerance will decrease Outcome: Progressing   Problem: Nutrition: Goal: Adequate nutrition will be maintained Outcome: Progressing   Problem: Coping: Goal: Level of anxiety will decrease Outcome: Progressing   Problem: Elimination: Goal: Will not experience complications related to bowel motility Outcome: Progressing Goal: Will not experience complications related to urinary retention Outcome: Progressing   Problem:  Pain Managment: Goal: General experience of comfort will improve and/or be controlled Outcome: Progressing   Problem: Safety: Goal: Ability to remain free from injury will improve Outcome: Progressing   Problem: Skin Integrity: Goal: Risk for impaired skin integrity will decrease Outcome: Progressing   Problem: Activity: Goal: Ability to tolerate increased activity will improve Outcome: Progressing   Problem: Respiratory: Goal: Ability to maintain a clear airway and adequate ventilation will improve Outcome: Progressing   Problem: Role Relationship: Goal: Method of communication will improve Outcome: Progressing

## 2023-12-24 NOTE — Progress Notes (Signed)
 Progress Note   Patient: Alex [name] ZOX:[id number] DOB: [birth year] DOA: 11/19/2023     35 DOS: the patient was seen and examined on 12/24/2023       Brief hospital course: 28 yo M presenting to [facility] ED from outpatient rehab on 11/19/23 for evaluation of altered mental status.   History obtained per chart review and mother's telephone interview, patient unable to participate in interview due to respiratory distress and baseline TBI. 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 He was treated at [facility] for 5 months then discharged to rehab. Per his mother and legal guardian his baseline is: Non verbal but he will yell out sporadic nonsensical words with left sided paralysis. He is able to move his RUE in order to feed himself with finger foods and he has some involuntary movement with that arm, he will swat at you. Similar baseline function with RLE, he can kick and move, but often will lay it bent and to the side. He has enough strength to even try and get out of bed on the right side. She denies any issues with swallowing, but eats mostly soft food. If he is not watched closely with eating he will try to eat all the food at once. The facility staff reported he was at his normal baseline until lunchtime on 11/19/23. It was observed he was more somnolent and not interactive. Staff noted a cough and slightly increased work of breathing. Mom also confirmed noting a congested cough the last time she visited earlier in the week. Staff and mom denied nausea/ vomiting, but mom reports chronic loose stools.   EMS reported the patient febrile and tachycardic on arrival.   ED course: Upon arrival patient tachycardic and lethargic. Sepsis protocol initiated with antibiotics and IVF resuscitation. Labs significant for mild hypokalemia, otherwise WNL. Initial imaging unremarkable but then patient became hypoxic with SpO2 85% on RA and a CTa was obtained, negative for PE but concerning  for aspiration pneumonia.   11/20/23: Admit to ICU due to acute hypoxic respiratory failure requiring urgent intubation and mechanical ventilatory support secondary to suspected aspiration and pneumonia 11/21/23- patient moving RUE, mother at bedside we reviewed medical plan. He remains on 34mcg/kg/hr levophed, today plan to rescusitate more aggresively and wean from levophed and potentially extubate post SBP.   He is febrile this am.  He is on zithromax, unasyn 11/22/23- s/p bronch yesterday with aspiration of mucus plugging.  Today resp status improved with liberation protocol in proces. SLP post extubation today. 11/23/23- patient is +for pseudomonas resp cultures.  He is also MRSA pcr +, refined therapy during rounds with pharmacist. He remains on MV 11/24/23- patient is still on vasopressor support weaning down on MV.  Secretions are slightly better.  11/25/23- patient weaned off levophed, failed SBT with tachypnea, tachycardia.  Secretions much improved. 11/26/23- on minimal vent support, unable to perform SBT due to copious secretions from ETT.   11/27/23- on minimal vent support, secretions improved.  Change Doxycycline to Vancomycin.  Plan for SBT as tolerated. 11/28/23-Pt successfully extubated 02/4, currently tolerating HHFNC @35L /35%.  Required low dose precedex overnight to allow suctioning due to excessive secretions. Pt with suspected seizure activity developed tremors in the right upper extremity and became minimally responsive.  Received 1g of iv keppra.   11/29/23-Overnight pt developed sinus tachycardia/svt 140 to 160's improved following 2.5 mg iv metoprolol.  Tolerating RA with no signs of respiratory distress.  Transferring to progressive  care unit TRH to pick on 02/7 12/01/23: Pt transferred back to ICU with severe acute hypoxic respiratory failure initially placed on HHFNC but due to significant hypoxia O2 sats in the 60's he required reintubation and underwent emergent bronchoscopy which revealed  thick secretions in the LUL and LLL resulting in mucous plugging. Therapeutic aspiration performed. CXR showed collapse of the LUL secondary to mucus plugging  12/03/23: Pt remains mechanically intubated on minimal vent settings.  ENT consulted for tracheostomy placement due to recurrent respiratory failure due to inability to clear secretions.  Requiring levophed gtt to maintain map 65 or higher  12/04/23: No acute events overnight on minimal settings pending tracheostomy placement  12/05/23: No acute events overnight, on minimal vent support. Initially on low dose Levophed, now weaned off.  IR placed PEG tube. Pending Tracheostomy placement on Friday. 12/06/23: No acute events overnight, on minimal vent support. Awaiting Tracheostomy placement tomorrow. 12/07/23: No acute events overnight.  Tracheostomy placed this morning by ENT, no events with procedure. Will keep sedated today with newly placed Trach with plan for WUA/SBT tomorrow. 12/08/23: All sedation turned off for WUA will perform SBT or TCT as tolerated.  Pt spike at temp 100.9 F, hypotensive requiring levophed gtt, and tachycardic concerning for possible sepsis.  Blood culture/tracheal aspirate and UA sent.  Restarted broad spectrum abx (vancomycin/zosyn) 12/09/23: Pt no longer requiring levophed gtt.  Tmax 102.4 F 12/10/23: Central Line Removed, not requiring pressors or sedation. Tolearting TC. 12/11/23: Overnight was started back on Gabapentin and scheduled Propanol for possible neurostorming with significant improvement in HR from the 170's to mid 100's 12/12/23: HR remains stable in low 100's, not requiring vasopressors, on minimal vent support, SBT as tolerated.  Complete course of ABX as suspect he is colonized with Pseudomonas and MRSA. 12/13/23: Overnight with concern for possible tube feeds in ETT tubing. Propanol was decreased due to soft BP. CXR this morning without new infiltrate, no leukocytosis or fever, and bowel pattern normal on KUB,  will restart tube feeds. Remains on minimal vent support, execise in PSV as tolerated ~ unable to tolerate less than 10/5 in PSV. 12/14/23: No significant events overnight.  Afebrile, hemodynamically stable.  On minimal vent support, continue with SBT/TCT as tolerated. 12/15/23: No change in patient's condition.  Remained on full vent support overnight.  Now on pressure support and tolerating. 12/16/23: Tolerated trach collar trials 12/17/23- patient awaiting placement.  He may have had another seizure and we will order EEG for him today.  He may not leave due to insurance qualification with trache.        Assessment and Plan: * Hypotension Continue increased dose of midodrine 15 mg 3 times daily.   Tachycardia Continue Cardizem and metoprolol   Septic shock (HCC) Completed antibiotics.  Still on midodrine for blood pressure.  Continue to watch temperature curve.  White blood cell count normal range.   Acute metabolic encephalopathy Patient is alert.  Patient able to move his right arm. EEG showed moderate diffuse encephalopathy but no epileptiform discharges seen  Aspiration pneumonia (HCC) Completed antibiotics.  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  Has completed course of Zyvox and Zosyn.   Hypokalemia Continue repletion and monitoring   Seizure disorder (HCC) Patient on valproic acid, gabapentin.  Recent EEG did not show any seizure activity.   History of traumatic brain injury Patient is bedbound.  But he is able to move his right arm.   Reactive thrombocytosis Continue monitoring platelet levels   Pressure  injury of skin Present on admission.  See full description below.   Acute hypoxic respiratory failure [facility]) Patient intubated during the hospital course.  Tracheostomy on 12/07/2023.  Now on trach collar.     Subjective:  Patient seen and examined at bedside this morning TOC working on placement No acute overnight events reported    Physical  Exam: HENT:     Head: Normocephalic.  Eyes:     General: Lids are normal.     Conjunctiva/sclera: Conjunctivae normal.  Cardiovascular:     Rate and Rhythm: Regular rhythm. Tachycardia present.     Heart sounds: Normal heart sounds, S1 normal and S2 normal.  Pulmonary:     Breath sounds: No decreased breath sounds, wheezing, rhonchi or rales.  Abdominal:     Palpations: Abdomen is soft.     Tenderness: There is no abdominal tenderness.  Musculoskeletal:     Right lower leg: No swelling.     Left lower leg: No swelling.     Comments: Left arm contracted  Skin:    General: Skin is warm.     Findings: No rash.  Neurological:     Mental Status: He is alert.     Comments: Patient able to move his right hand and arm.      Family Communication: None at bedside   Disposition: Status is: Inpatient Remains inpatient appropriate because: TOC looking into options  Planned Discharge Destination: Rehab     Data Reviewed:   Vitals:   12/24/23 0500 12/24/23 0537 12/24/23 0919 12/24/23 1159  BP:  114/79 130/82 130/77  Pulse:  (!) 101 (!) 116 (!) 109  Resp:  18 18 18   Temp:  98.6 F (37 C) 98.1 F (36.7 C) 98.2 F (36.8 C)  TempSrc:  Axillary    SpO2:  100% 97% 97%  Weight: 62.3 kg     Height:          Latest Ref Rng & Units 12/24/2023    5:44 AM 12/23/2023    7:07 AM 12/21/2023    4:39 AM  CBC  WBC 4.0 - 10.5 K/uL 5.2  6.0  5.0   Hemoglobin 13.0 - 17.0 g/dL 16.1  09.6  04.5   Hematocrit 39.0 - 52.0 % 35.1  34.4  34.1   Platelets 150 - 400 K/uL 466  480  499        Latest Ref Rng & Units 12/24/2023    5:44 AM 12/23/2023    7:07 AM 12/21/2023    4:39 AM  BMP  Glucose 70 - 99 mg/dL 91  409  811   BUN 6 - 20 mg/dL 16  16  20    Creatinine 0.61 - 1.24 mg/dL 9.14  7.82  9.56   Sodium 135 - 145 mmol/L 139  139  136   Potassium 3.5 - 5.1 mmol/L 4.4  4.1  3.9   Chloride 98 - 111 mmol/L 104  105  101   CO2 22 - 32 mmol/L 28  26  26    Calcium 8.9 - 10.3 mg/dL 9.2  9.2  9.3       Author: Loyce Dys, MD 12/24/2023 3:59 PM  For on call review [email or web address].

## 2023-12-25 DIAGNOSIS — I959 Hypotension, unspecified: Secondary | ICD-10-CM | POA: Diagnosis not present

## 2023-12-25 LAB — CBC WITH DIFFERENTIAL/PLATELET
Abs Immature Granulocytes: 0.02 10*3/uL (ref 0.00–0.07)
Basophils Absolute: 0 10*3/uL (ref 0.0–0.1)
Basophils Relative: 0 %
Eosinophils Absolute: 0.4 10*3/uL (ref 0.0–0.5)
Eosinophils Relative: 8 %
HCT: 35.3 % — ABNORMAL LOW (ref 39.0–52.0)
Hemoglobin: 11.3 g/dL — ABNORMAL LOW (ref 13.0–17.0)
Immature Granulocytes: 0 %
Lymphocytes Relative: 26 %
Lymphs Abs: 1.3 10*3/uL (ref 0.7–4.0)
MCH: 30.2 pg (ref 26.0–34.0)
MCHC: 32 g/dL (ref 30.0–36.0)
MCV: 94.4 fL (ref 80.0–100.0)
Monocytes Absolute: 0.4 10*3/uL (ref 0.1–1.0)
Monocytes Relative: 9 %
Neutro Abs: 2.8 10*3/uL (ref 1.7–7.7)
Neutrophils Relative %: 57 %
Platelets: 468 10*3/uL — ABNORMAL HIGH (ref 150–400)
RBC: 3.74 MIL/uL — ABNORMAL LOW (ref 4.22–5.81)
RDW: 12.7 % (ref 11.5–15.5)
WBC: 4.9 10*3/uL (ref 4.0–10.5)
nRBC: 0 % (ref 0.0–0.2)

## 2023-12-25 LAB — BASIC METABOLIC PANEL
Anion gap: 6 (ref 5–15)
BUN: 12 mg/dL (ref 6–20)
CO2: 28 mmol/L (ref 22–32)
Calcium: 9.4 mg/dL (ref 8.9–10.3)
Chloride: 103 mmol/L (ref 98–111)
Creatinine, Ser: 0.45 mg/dL — ABNORMAL LOW (ref 0.61–1.24)
GFR, Estimated: >60 mL/min (ref >60)
Glucose, Bld: 93 mg/dL (ref 70–99)
Potassium: 4 mmol/L (ref 3.5–5.1)
Sodium: 137 mmol/L (ref 135–145)

## 2023-12-25 LAB — GLUCOSE, CAPILLARY
Glucose-Capillary: 119 mg/dL — ABNORMAL HIGH (ref 70–99)
Glucose-Capillary: 114 mg/dL — ABNORMAL HIGH (ref 70–99)
Glucose-Capillary: 114 mg/dL — ABNORMAL HIGH (ref 70–99)

## 2023-12-25 NOTE — Plan of Care (Signed)
  Problem: Fluid Volume: Goal: Hemodynamic stability will improve Outcome: Progressing   Problem: Clinical Measurements: Goal: Diagnostic test results will improve Outcome: Progressing Goal: Signs and symptoms of infection will decrease Outcome: Progressing   Problem: Respiratory: Goal: Ability to maintain adequate ventilation will improve Outcome: Progressing   Problem: Activity: Goal: Ability to tolerate increased activity will improve Outcome: Progressing   Problem: Respiratory: Goal: Ability to maintain a clear airway and adequate ventilation will improve Outcome: Progressing   Problem: Role Relationship: Goal: Method of communication will improve Outcome: Progressing   Problem: Education: Goal: Knowledge of General Education information will improve Description: Including pain rating scale, medication(s)/side effects and non-pharmacologic comfort measures Outcome: Progressing   Problem: Health Behavior/Discharge Planning: Goal: Ability to manage health-related needs will improve Outcome: Progressing   Problem: Clinical Measurements: Goal: Ability to maintain clinical measurements within normal limits will improve Outcome: Progressing Goal: Will remain free from infection Outcome: Progressing Goal: Diagnostic test results will improve Outcome: Progressing Goal: Respiratory complications will improve Outcome: Progressing Goal: Cardiovascular complication will be avoided Outcome: Progressing   Problem: Activity: Goal: Risk for activity intolerance will decrease Outcome: Progressing   Problem: Nutrition: Goal: Adequate nutrition will be maintained Outcome: Progressing   Problem: Coping: Goal: Level of anxiety will decrease Outcome: Progressing   Problem: Elimination: Goal: Will not experience complications related to bowel motility Outcome: Progressing Goal: Will not experience complications related to urinary retention Outcome: Progressing   Problem:  Pain Managment: Goal: General experience of comfort will improve and/or be controlled Outcome: Progressing   Problem: Safety: Goal: Ability to remain free from injury will improve Outcome: Progressing   Problem: Skin Integrity: Goal: Risk for impaired skin integrity will decrease Outcome: Progressing   Problem: Activity: Goal: Ability to tolerate increased activity will improve Outcome: Progressing   Problem: Respiratory: Goal: Ability to maintain a clear airway and adequate ventilation will improve Outcome: Progressing   Problem: Role Relationship: Goal: Method of communication will improve Outcome: Progressing

## 2023-12-25 NOTE — Progress Notes (Signed)
 Patient does not respond appropriately. His only words are "bitch" and "fuck you". Constantly tries to swat and hit at staff. Suctioned patient orally but he began biting on the [place]. Called respiratory after oxygen level was low even though I increased his oxygen due to low O2 readings. Scored a red MEWS after heart rate went up to 130's. Gave scheduled metoprolol and heart rate decreased to 103. He also has metoprolol IV prn in case po med did not work which lets me know that the tachycardia is not a new finding. Informed charge nurse but no current need to inform MD unless acute finding. No additional needs assessed.
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
## 2023-12-25 NOTE — Progress Notes (Signed)
 Progress Note   Patient: Alex [name] XBM:[id number] DOB: 30-Nov-1995 DOA: 11/19/2023     36 DOS: the patient was seen and examined on 12/25/2023     Brief hospital course: 28 yo M presenting to [facility] ED from outpatient rehab on 11/19/23 for evaluation of altered mental status.   History obtained per chart review and mother's telephone interview, patient unable to participate in interview due to respiratory distress and baseline TBI. 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 He was treated at [facility] for 5 months then discharged to rehab. Per his mother and legal guardian his baseline is: Non verbal but he will yell out sporadic nonsensical words with left sided paralysis. He is able to move his RUE in order to feed himself with finger foods and he has some involuntary movement with that arm, he will swat at you. Similar baseline function with RLE, he can kick and move, but often will lay it bent and to the side. He has enough strength to even try and get out of bed on the right side. She denies any issues with swallowing, but eats mostly soft food. If he is not watched closely with eating he will try to eat all the food at once. The facility staff reported he was at his normal baseline until lunchtime on 11/19/23. It was observed he was more somnolent and not interactive. Staff noted a cough and slightly increased work of breathing. Mom also confirmed noting a congested cough the last time she visited earlier in the week. Staff and mom denied nausea/ vomiting, but mom reports chronic loose stools.   EMS reported the patient febrile and tachycardic on arrival.   ED course: Upon arrival patient tachycardic and lethargic. Sepsis protocol initiated with antibiotics and IVF resuscitation. Labs significant for mild hypokalemia, otherwise WNL. Initial imaging unremarkable but then patient became hypoxic with SpO2 85% on RA and a CTa was obtained, negative for PE but concerning for  aspiration pneumonia.   11/20/23: Admit to ICU due to acute hypoxic respiratory failure requiring urgent intubation and mechanical ventilatory support secondary to suspected aspiration and pneumonia 11/21/23- patient moving RUE, mother at bedside we reviewed medical plan. He remains on 85mcg/kg/hr levophed, today plan to rescusitate more aggresively and wean from levophed and potentially extubate post SBP.   He is febrile this am.  He is on zithromax, unasyn 11/22/23- s/p bronch yesterday with aspiration of mucus plugging.  Today resp status improved with liberation protocol in proces. SLP post extubation today. 11/23/23- patient is +for pseudomonas resp cultures.  He is also MRSA pcr +, refined therapy during rounds with pharmacist. He remains on MV 11/24/23- patient is still on vasopressor support weaning down on MV.  Secretions are slightly better.  11/25/23- patient weaned off levophed, failed SBT with tachypnea, tachycardia.  Secretions much improved. 11/26/23- on minimal vent support, unable to perform SBT due to copious secretions from ETT.   11/27/23- on minimal vent support, secretions improved.  Change Doxycycline to Vancomycin.  Plan for SBT as tolerated. 11/28/23-Pt successfully extubated 02/4, currently tolerating HHFNC @35L /35%.  Required low dose precedex overnight to allow suctioning due to excessive secretions. Pt with suspected seizure activity developed tremors in the right upper extremity and became minimally responsive.  Received 1g of iv keppra.   11/29/23-Overnight pt developed sinus tachycardia/svt 140 to 160's improved following 2.5 mg iv metoprolol.  Tolerating RA with no signs of respiratory distress.  Transferring to progressive care unit  TRH to pick on 02/7 12/01/23: Pt transferred back to ICU with severe acute hypoxic respiratory failure initially placed on HHFNC but due to significant hypoxia O2 sats in the 60's he required reintubation and underwent emergent bronchoscopy which revealed  thick secretions in the LUL and LLL resulting in mucous plugging. Therapeutic aspiration performed. CXR showed collapse of the LUL secondary to mucus plugging  12/03/23: Pt remains mechanically intubated on minimal vent settings.  ENT consulted for tracheostomy placement due to recurrent respiratory failure due to inability to clear secretions.  Requiring levophed gtt to maintain map 65 or higher  12/04/23: No acute events overnight on minimal settings pending tracheostomy placement  12/05/23: No acute events overnight, on minimal vent support. Initially on low dose Levophed, now weaned off.  IR placed PEG tube. Pending Tracheostomy placement on Friday. 12/06/23: No acute events overnight, on minimal vent support. Awaiting Tracheostomy placement tomorrow. 12/07/23: No acute events overnight.  Tracheostomy placed this morning by ENT, no events with procedure. Will keep sedated today with newly placed Trach with plan for WUA/SBT tomorrow. 12/08/23: All sedation turned off for WUA will perform SBT or TCT as tolerated.  Pt spike at temp 100.9 F, hypotensive requiring levophed gtt, and tachycardic concerning for possible sepsis.  Blood culture/tracheal aspirate and UA sent.  Restarted broad spectrum abx (vancomycin/zosyn) 12/09/23: Pt no longer requiring levophed gtt.  Tmax 102.4 F 12/10/23: Central Line Removed, not requiring pressors or sedation. Tolearting TC. 12/11/23: Overnight was started back on Gabapentin and scheduled Propanol for possible neurostorming with significant improvement in HR from the 170's to mid 100's 12/12/23: HR remains stable in low 100's, not requiring vasopressors, on minimal vent support, SBT as tolerated.  Complete course of ABX as suspect he is colonized with Pseudomonas and MRSA. 12/13/23: Overnight with concern for possible tube feeds in ETT tubing. Propanol was decreased due to soft BP. CXR this morning without new infiltrate, no leukocytosis or fever, and bowel pattern normal on KUB,  will restart tube feeds. Remains on minimal vent support, execise in PSV as tolerated ~ unable to tolerate less than 10/5 in PSV. 12/14/23: No significant events overnight.  Afebrile, hemodynamically stable.  On minimal vent support, continue with SBT/TCT as tolerated. 12/15/23: No change in patient's condition.  Remained on full vent support overnight.  Now on pressure support and tolerating. 12/16/23: Tolerated trach collar trials 12/17/23- patient awaiting placement.  He may have had another seizure and we will order EEG for him today.  He may not leave due to insurance qualification with trache.   2/25-3/4: TOC working on placement     Assessment and Plan: * Hypotension Continue increased dose of midodrine 15 mg 3 times daily.   Tachycardia-improved Continue Cardizem and metoprolol   Septic shock (HCC) Completed antibiotics.  Still on midodrine for blood pressure.  Continue to watch temperature curve.  White blood cell count normal range.   Acute metabolic encephalopathy Patient is alert.  Patient able to move his right arm. EEG showed moderate diffuse encephalopathy but no epileptiform discharges seen   Aspiration pneumonia (HCC) Completed antibiotics.  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  Has completed course of Zyvox and Zosyn.   Hypokalemia Continue repletion and monitoring   Seizure disorder (HCC) Patient on valproic acid, gabapentin.  Recent EEG did not show any seizure activity.   History of traumatic brain injury Patient is bedbound.  But he is able to move his right arm.   Reactive thrombocytosis Continue monitoring platelet levels  Pressure injury of skin Present on admission.  See full description below.   Acute hypoxic respiratory failure [facility]) Patient intubated during the hospital course.  Tracheostomy on 12/07/2023.  Now on trach collar.     Subjective:  Patient seen and examined at bedside this morning TOC continue to work on placement I  discussed the case with them today Unable to obtain subjective information as patient is verbal due to TBI   Physical Exam: HENT:     Head: Normocephalic.  Eyes:     General: Lids are normal.     Conjunctiva/sclera: Conjunctivae normal.  Cardiovascular:     Rate and Rhythm: Regular rhythm.  Tachycardia improved    Heart sounds: Normal heart sounds, S1 normal and S2 normal.  Pulmonary:     Breath sounds: No decreased breath sounds, wheezing, rhonchi or rales.  Abdominal:     Palpations: Abdomen is soft.     Tenderness: There is no abdominal tenderness.  Musculoskeletal:     Right lower leg: No swelling.     Left lower leg: No swelling.     Comments: Left arm contracted  Skin:    General: Skin is warm.     Findings: No rash.  Neurological:     Mental Status: He is alert.     Comments: Patient able to move his right hand and arm.      Family Communication: None at bedside   Disposition: Status is: Inpatient Remains inpatient appropriate because: TOC looking into options  Planned Discharge Destination: Rehab     Data Reviewed:   Vitals:   12/24/23 2311 12/25/23 0500 12/25/23 0845 12/25/23 1233  BP: 107/74 111/84 107/75 108/74  Pulse: 100 96 100 96  Resp:   16 16  Temp: 98.4 F (36.9 C) 98.7 F (37.1 C) 98.1 F (36.7 C) 98 F (36.7 C)  TempSrc: Axillary Axillary  Oral  SpO2: 99% 100% 97% 98%  Weight:  63.5 kg    Height:          Latest Ref Rng & Units 12/25/2023    5:02 AM 12/24/2023    5:44 AM 12/23/2023    7:07 AM  CBC  WBC 4.0 - 10.5 K/uL 4.9  5.2  6.0   Hemoglobin 13.0 - 17.0 g/dL 40.9  81.1  91.4   Hematocrit 39.0 - 52.0 % 35.3  35.1  34.4   Platelets 150 - 400 K/uL 468  466  480        Latest Ref Rng & Units 12/25/2023    5:02 AM 12/24/2023    5:44 AM 12/23/2023    7:07 AM  BMP  Glucose 70 - 99 mg/dL 93  91  782   BUN 6 - 20 mg/dL 12  16  16    Creatinine 0.61 - 1.24 mg/dL 9.56  2.13  0.86   Sodium 135 - 145 mmol/L 137  139  139   Potassium 3.5 - 5.1  mmol/L 4.0  4.4  4.1   Chloride 98 - 111 mmol/L 103  104  105   CO2 22 - 32 mmol/L 28  28  26    Calcium 8.9 - 10.3 mg/dL 9.4  9.2  9.2      Author: Loyce Dys, MD 12/25/2023 5:04 PM  For on call review [email or web address].

## 2023-12-26 ENCOUNTER — Inpatient Hospital Stay

## 2023-12-26 DIAGNOSIS — J9601 Acute respiratory failure with hypoxia: Secondary | ICD-10-CM | POA: Diagnosis not present

## 2023-12-26 DIAGNOSIS — Z87820 Personal history of traumatic brain injury: Secondary | ICD-10-CM | POA: Diagnosis not present

## 2023-12-26 DIAGNOSIS — J690 Pneumonitis due to inhalation of food and vomit: Secondary | ICD-10-CM | POA: Diagnosis not present

## 2023-12-26 DIAGNOSIS — R000 Tachycardia, unspecified: Secondary | ICD-10-CM | POA: Diagnosis not present

## 2023-12-26 LAB — CBC WITH DIFFERENTIAL/PLATELET
Abs Immature Granulocytes: 0.02 10*3/uL (ref 0.00–0.07)
Basophils Absolute: 0 10*3/uL (ref 0.0–0.1)
Basophils Relative: 0 %
Eosinophils Absolute: 0.1 10*3/uL (ref 0.0–0.5)
Eosinophils Relative: 1 %
HCT: 38.8 % — ABNORMAL LOW (ref 39.0–52.0)
Hemoglobin: 12.7 g/dL — ABNORMAL LOW (ref 13.0–17.0)
Immature Granulocytes: 0 %
Lymphocytes Relative: 10 %
Lymphs Abs: 0.8 10*3/uL (ref 0.7–4.0)
MCH: 31.1 pg (ref 26.0–34.0)
MCHC: 32.7 g/dL (ref 30.0–36.0)
MCV: 94.9 fL (ref 80.0–100.0)
Monocytes Absolute: 0.6 10*3/uL (ref 0.1–1.0)
Monocytes Relative: 8 %
Neutro Abs: 6.9 10*3/uL (ref 1.7–7.7)
Neutrophils Relative %: 81 %
Platelets: 513 10*3/uL — ABNORMAL HIGH (ref 150–400)
RBC: 4.09 MIL/uL — ABNORMAL LOW (ref 4.22–5.81)
RDW: 12.8 % (ref 11.5–15.5)
WBC: 8.5 10*3/uL (ref 4.0–10.5)
nRBC: 0 % (ref 0.0–0.2)

## 2023-12-26 LAB — BASIC METABOLIC PANEL
Anion gap: 10 (ref 5–15)
BUN: 18 mg/dL (ref 6–20)
CO2: 25 mmol/L (ref 22–32)
Calcium: 9.4 mg/dL (ref 8.9–10.3)
Chloride: 101 mmol/L (ref 98–111)
Creatinine, Ser: 0.5 mg/dL — ABNORMAL LOW (ref 0.61–1.24)
GFR, Estimated: >60 mL/min (ref >60)
Glucose, Bld: 109 mg/dL — ABNORMAL HIGH (ref 70–99)
Potassium: 4 mmol/L (ref 3.5–5.1)
Sodium: 136 mmol/L (ref 135–145)

## 2023-12-26 LAB — GLUCOSE, CAPILLARY
Glucose-Capillary: 107 mg/dL — ABNORMAL HIGH (ref 70–99)
Glucose-Capillary: 108 mg/dL — ABNORMAL HIGH (ref 70–99)
Glucose-Capillary: 110 mg/dL — ABNORMAL HIGH (ref 70–99)
Glucose-Capillary: 123 mg/dL — ABNORMAL HIGH (ref 70–99)

## 2023-12-26 MED ORDER — SODIUM CHLORIDE 3 % IN NEBU
4.0000 mL | INHALATION_SOLUTION | Freq: Two times a day (BID) | RESPIRATORY_TRACT | Status: AC
  Administered 2023-12-26 – 2023-12-29 (×6): 4 mL via RESPIRATORY_TRACT
  Filled 2023-12-26 (×6): qty 4

## 2023-12-26 NOTE — Plan of Care (Signed)
  Problem: Fluid Volume: Goal: Hemodynamic stability will improve Outcome: Progressing   Problem: Clinical Measurements: Goal: Diagnostic test results will improve Outcome: Progressing Goal: Signs and symptoms of infection will decrease Outcome: Progressing   Problem: Respiratory: Goal: Ability to maintain adequate ventilation will improve Outcome: Progressing   Problem: Activity: Goal: Ability to tolerate increased activity will improve Outcome: Progressing   Problem: Respiratory: Goal: Ability to maintain a clear airway and adequate ventilation will improve Outcome: Progressing   Problem: Role Relationship: Goal: Method of communication will improve Outcome: Progressing   Problem: Education: Goal: Knowledge of General Education information will improve Description: Including pain rating scale, medication(s)/side effects and non-pharmacologic comfort measures Outcome: Progressing   Problem: Health Behavior/Discharge Planning: Goal: Ability to manage health-related needs will improve Outcome: Progressing   Problem: Clinical Measurements: Goal: Ability to maintain clinical measurements within normal limits will improve Outcome: Progressing Goal: Will remain free from infection Outcome: Progressing Goal: Diagnostic test results will improve Outcome: Progressing Goal: Respiratory complications will improve Outcome: Progressing Goal: Cardiovascular complication will be avoided Outcome: Progressing   Problem: Activity: Goal: Risk for activity intolerance will decrease Outcome: Progressing   Problem: Nutrition: Goal: Adequate nutrition will be maintained Outcome: Progressing   Problem: Coping: Goal: Level of anxiety will decrease Outcome: Progressing   Problem: Elimination: Goal: Will not experience complications related to bowel motility Outcome: Progressing Goal: Will not experience complications related to urinary retention Outcome: Progressing   Problem:  Pain Managment: Goal: General experience of comfort will improve and/or be controlled Outcome: Progressing   Problem: Safety: Goal: Ability to remain free from injury will improve Outcome: Progressing   Problem: Skin Integrity: Goal: Risk for impaired skin integrity will decrease Outcome: Progressing   Problem: Activity: Goal: Ability to tolerate increased activity will improve Outcome: Progressing   Problem: Respiratory: Goal: Ability to maintain a clear airway and adequate ventilation will improve Outcome: Progressing   Problem: Role Relationship: Goal: Method of communication will improve Outcome: Progressing

## 2023-12-26 NOTE — Progress Notes (Signed)
 Progress Note   Patient: Alex [name] ZOX:[id number] DOB: 17-Nov-1995 DOA: 11/19/2023     37 DOS: the patient was seen and examined on 12/26/2023   Hospital course:  28 yo M presenting to [facility] ED from outpatient rehab on 11/19/23 for evaluation of altered mental status.   History obtained per chart review and mother's telephone interview, patient unable to participate in interview due to respiratory distress and baseline TBI. 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 He was treated at [facility] for 5 months then discharged to rehab. Per his mother and legal guardian his baseline is: Non verbal but he will yell out sporadic nonsensical words with left sided paralysis. He is able to move his RUE in order to feed himself with finger foods and he has some involuntary movement with that arm, he will swat at you. Similar baseline function with RLE, he can kick and move, but often will lay it bent and to the side. He has enough strength to even try and get out of bed on the right side. She denies any issues with swallowing, but eats mostly soft food. If he is not watched closely with eating he will try to eat all the food at once. The facility staff reported he was at his normal baseline until lunchtime on 11/19/23. It was observed he was more somnolent and not interactive. Staff noted a cough and slightly increased work of breathing. Mom also confirmed noting a congested cough the last time she visited earlier in the week. Staff and mom denied nausea/ vomiting, but mom reports chronic loose stools.   EMS reported the patient febrile and tachycardic on arrival.   ED course: Upon arrival patient tachycardic and lethargic. Sepsis protocol initiated with antibiotics and IVF resuscitation. Labs significant for mild hypokalemia, otherwise WNL. Initial imaging unremarkable but then patient became hypoxic with SpO2 85% on RA and a CTa was obtained, negative for PE but concerning for  aspiration pneumonia.   11/20/23: Admit to ICU due to acute hypoxic respiratory failure requiring urgent intubation and mechanical ventilation secondary to suspected aspiration pneumonia 11/21/23- patient moving RUE, mother at bedside we reviewed medical plan. He remains on 41mcg/kg/hr levophed, today plan to rescusitate more aggresively and wean from levophed and potentially extubate post SBP.   He is febrile this am.  He is on zithromax, unasyn 11/22/23- s/p bronch yesterday with aspiration of mucus plugging.  Today resp status improved with liberation protocol in proces. SLP post extubation today. 11/23/23- patient is +for pseudomonas resp cultures.  He is also MRSA pcr +, refined therapy during rounds with pharmacist. He remains on MV 11/24/23- patient is still on vasopressor support weaning down on MV.  Secretions are slightly better.  11/25/23- patient weaned off levophed, failed SBT with tachypnea, tachycardia.  Secretions much improved. 11/26/23- on minimal vent support, unable to perform SBT due to copious secretions from ETT.   11/27/23- on minimal vent support, secretions improved.  Change Doxycycline to Vancomycin.  Plan for SBT as tolerated. 11/28/23-Pt successfully extubated 02/4, currently tolerating HHFNC @35L /35%.  Required low dose precedex overnight to allow suctioning due to excessive secretions. Pt with suspected seizure activity developed tremors in the right upper extremity and became minimally responsive.  Received 1g of iv keppra.   11/29/23-Overnight pt developed sinus tachycardia/svt 140 to 160's improved following 2.5 mg iv metoprolol.  Tolerating RA with no signs of respiratory distress.  Transferring to progressive care unit TRH to pick on  02/7 12/01/23: Pt transferred back to ICU with severe acute hypoxic respiratory failure initially placed on HHFNC but due to significant hypoxia O2 sats in the 60's he required reintubation and underwent emergent bronchoscopy which revealed thick secretions  in the LUL and LLL resulting in mucous plugging. Therapeutic aspiration performed. CXR showed collapse of the LUL secondary to mucus plugging  12/03/23: Pt remains mechanically intubated on minimal vent settings.  ENT consulted for tracheostomy placement due to recurrent respiratory failure due to inability to clear secretions.  Requiring levophed gtt to maintain map 65 or higher  12/04/23: No acute events overnight on minimal settings pending tracheostomy placement  12/05/23: No acute events overnight, on minimal vent support. Initially on low dose Levophed, now weaned off.  IR placed PEG tube. Pending Tracheostomy placement on Friday. 12/06/23: No acute events overnight, on minimal vent support. Awaiting Tracheostomy placement tomorrow. 12/07/23: No acute events overnight.  Tracheostomy placed this morning by ENT, no events with procedure. Will keep sedated today with newly placed Trach with plan for WUA/SBT tomorrow. 12/08/23: All sedation turned off for WUA will perform SBT or TCT as tolerated.  Pt spike at temp 100.9 F, hypotensive requiring levophed gtt, and tachycardic concerning for possible sepsis.  Blood culture/tracheal aspirate and UA sent.  Restarted broad spectrum abx (vancomycin/zosyn) 12/09/23: Pt no longer requiring levophed gtt.  Tmax 102.4 F 12/10/23: Central Line Removed, not requiring pressors or sedation. Tolerating TC. 12/11/23: Overnight was started back on Gabapentin and scheduled Propanol for possible neurostorming with significant improvement in HR from the 170's to mid 100's 12/12/23: HR remains stable in low 100's, not requiring vasopressors, on minimal vent support, SBT as tolerated.  Complete course of ABX as suspect he is colonized with Pseudomonas and MRSA. 12/13/23: Overnight with concern for possible tube feeds in ETT tubing. Propanol was decreased due to soft BP. CXR this morning without new infiltrate, no leukocytosis or fever, and bowel pattern normal on KUB, will restart tube  feeds. Remains on minimal vent support, execise in PSV as tolerated ~ unable to tolerate less than 10/5 in PSV. 12/14/23: No significant events overnight.  Afebrile, hemodynamically stable.  On minimal vent support, continue with SBT/TCT as tolerated. 12/15/23: No change in patient's condition.  Remained on full vent support overnight.  Now on pressure support and tolerating. 12/16/23: Tolerated trach collar trials 12/17/23- patient awaiting placement.  He may have had another seizure and we will order EEG for him today.  Disposition likely difficult due to insurance qualification with trach.   2/25-3/4: TOC working on placement   I assumed care on 12/26/2023. 3/5: oxygen desaturations with pt care tasks per RN this morning.  CXR shows decreased inflation and volume loss of left hemithorax with possible mucus plugging of left mainstem bronchus.    Assessment and Plan:  Acute respiratory failure with hypoxia Patient required intubation on admission, later re-intubated as outlined above in hospital course.  Tracheostomy on 12/07/2023.   Has been tolerating trach collar. 3/5 -- O2 desaturations with movement during pt care tasks per RN --Repeat CXR this AM as above --Add Chest PT to clear mucus plug --O2 per protocol, maintain sat > 90%  Aspiration pneumonia Completed antibiotics.   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   Completed course of Zyvox and Zosyn. --Aspiration precautions --NPO --SLP consulted, last seen on 12/01/23 while intubated --On tube feeds, RD following   Hypotension --Continue increased dose of midodrine 15 mg 3 times daily.   Tachycardia-improved Related to  TBI and "neurostorming" - has recurrent episodes of significant tachycardia --Continue Cardizem and metoprolol --PRN IV metoprolol if HR sustaining > 135 bpm   Septic shock - resolved Completed antibiotics.  Still on midodrine for blood pressure.   --Monitor fever curve and CBC   Acute metabolic  encephalopathy Patient is alert, able to move his right arm. EEG showed moderate diffuse encephalopathy but no epileptiform discharges seen   Hypokalemia --Continue repletion and monitoring   Seizure disorder Recent EEG did not show any seizure activity. -- Continue valproic acid, gabapentin.     History of traumatic brain injury Bedbound at baseline, able to move his right arm. --Continue Baclofen, gabapentin, Robinul, Depakote, Seroquel   Reactive thrombocytosis --Monitor CBC   Pressure injury of  - POA.   See full description below, I agree with characterization. --Continue frequent repositioning, offloading pressure areas --Wound care --Monitor closely for signs of infection          Subjective: Pt awake when seen today.  RN reporting this AM pt's O2 desaturates when they move him for pt care tasks, this is new from prior.     Physical Exam: Vitals:   12/26/23 0715 12/26/23 0802 12/26/23 0843 12/26/23 1340  BP:   101/70 111/75  Pulse: (!) 131  (!) 127 (!) 107  Resp: (!) 22  18   Temp:   98.7 F (37.1 C) 98.5 F (36.9 C)  TempSrc:      SpO2:  93% 91%   Weight:      Height:       General exam: awake, alert, no acute distress HEENT: moist mucus membranes, hearing grossly normal  Respiratory system: diffuse rhonchi vs referred upper airway secretion sounds, diminished on the left, no wheezes, normal respiratory effort, trach collar on 10 L/min O2. Cardiovascular system: normal S1/S2, tachycardic, regular rhythm, +pedal edema.   Gastrointestinal system: soft, NT, ND, no HSM felt, +bowel sounds. Central nervous system: limited exam, hypertonicity of extremities, attempts to follow commands, unable to comprehend his speech Skin: dry, intact, normal temperature, no rashes seen on visualized skin   Data Reviewed:  Notable labs --  BMP normal except glucose 109, Cr 0.50 CBC with Hbg 12.7, normal WBC, platelets 513k  Family Communication: None  present  Disposition: Status is: Inpatient Remains inpatient appropriate because: Awaiting placement.  Ongoing evaluation for recurrent episodes of hypoxia this AM.   Planned Discharge Destination: Skilled nursing facility versus LTAC    Time spent: 52 minutes including time spent at bedside and in coordination of care with staff and consultants, review and interpretation of prior hospital records, diagnostic studies and treatments to date.   Author: Pennie Banter, DO 12/26/2023 3:12 PM  For on call review [email or web address].

## 2023-12-26 NOTE — Progress Notes (Signed)
 While performing rounding/report on patient, I become concerned. I had him last night and he was alert, aggressive and always attempts to swat at staff with his RUE which is the only extremity that is mobile. Patient had an episode last night/this morning when I had him where he desaturated when turned all the way to his side and had a hard time coming back up but he remained alert. I am concerned because his mentation has changed tremendously as reported by day nurse. Now he only responds to voice and goes right back to sleep. His vitals are stable except he has a low grade fever of 100.3 ax. Day team performed an xray where they found a left lung opacity and what may be a mucus plug. Due to his mentation, I'd like to hold his sedating meds and will be monitoring closely. He is now a yellow MEWS due to his mentation.Secure chatted on call to advise further.

## 2023-12-26 NOTE — Progress Notes (Signed)
 Went in to change patient's position and noticed that he had soiled himself. Notified tech that I would need help as I cannot turn him all the way on his side alone and change bed. When turning him on his side, patient began to desat into the 80s and became tachypneic and tachycardic. Placed patient back onto back and called respiratory when his oxygen would not come up. Patient is hard to recover once the desaturates. Respiratory increased his oxygen and patient was able to stay between 90-96 on oxygen. Gave prn for metoprolol for heart rate which decreased heart rate to 131 only-prn is to give once over 135. Will continue to monitor patient.

## 2023-12-27 DIAGNOSIS — Z87820 Personal history of traumatic brain injury: Secondary | ICD-10-CM | POA: Diagnosis not present

## 2023-12-27 DIAGNOSIS — R000 Tachycardia, unspecified: Secondary | ICD-10-CM | POA: Diagnosis not present

## 2023-12-27 DIAGNOSIS — J9601 Acute respiratory failure with hypoxia: Secondary | ICD-10-CM | POA: Diagnosis not present

## 2023-12-27 DIAGNOSIS — G40909 Epilepsy, unspecified, not intractable, without status epilepticus: Secondary | ICD-10-CM | POA: Diagnosis not present

## 2023-12-27 LAB — GLUCOSE, CAPILLARY
Glucose-Capillary: 103 mg/dL — ABNORMAL HIGH (ref 70–99)
Glucose-Capillary: 106 mg/dL — ABNORMAL HIGH (ref 70–99)
Glucose-Capillary: 107 mg/dL — ABNORMAL HIGH (ref 70–99)
Glucose-Capillary: 126 mg/dL — ABNORMAL HIGH (ref 70–99)

## 2023-12-27 LAB — CBC
HCT: 35.7 % — ABNORMAL LOW (ref 39.0–52.0)
Hemoglobin: 11.6 g/dL — ABNORMAL LOW (ref 13.0–17.0)
MCH: 30.9 pg (ref 26.0–34.0)
MCHC: 32.5 g/dL (ref 30.0–36.0)
MCV: 95.2 fL (ref 80.0–100.0)
Platelets: 427 10*3/uL — ABNORMAL HIGH (ref 150–400)
RBC: 3.75 MIL/uL — ABNORMAL LOW (ref 4.22–5.81)
RDW: 12.8 % (ref 11.5–15.5)
WBC: 12.9 10*3/uL — ABNORMAL HIGH (ref 4.0–10.5)
nRBC: 0 % (ref 0.0–0.2)

## 2023-12-27 MED ORDER — BANATROL TF EN LIQD
60.0000 mL | Freq: Two times a day (BID) | ENTERAL | Status: DC
  Administered 2023-12-27 – 2024-05-14 (×272): 60 mL

## 2023-12-27 NOTE — Progress Notes (Addendum)
 Progress Note   Patient: Alex [name]:[id number] DOB: 06-19-1996 DOA: 11/19/2023     38 DOS: the patient was seen and examined on 12/27/2023   Hospital course:  28 yo M presenting to [facility] ED from outpatient rehab on 11/19/23 for evaluation of altered mental status.   History obtained per chart review and mother's telephone interview, patient unable to participate in interview due to respiratory distress and baseline TBI. 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 He was treated at [facility] for 5 months then discharged to rehab. Per his mother and legal guardian his baseline is: Non verbal but he will yell out sporadic nonsensical words with left sided paralysis. He is able to move his RUE in order to feed himself with finger foods and he has some involuntary movement with that arm, he will swat at you. Similar baseline function with RLE, he can kick and move, but often will lay it bent and to the side. He has enough strength to even try and get out of bed on the right side. She denies any issues with swallowing, but eats mostly soft food. If he is not watched closely with eating he will try to eat all the food at once. The facility staff reported he was at his normal baseline until lunchtime on 11/19/23. It was observed he was more somnolent and not interactive. Staff noted a cough and slightly increased work of breathing. Mom also confirmed noting a congested cough the last time she visited earlier in the week. Staff and mom denied nausea/ vomiting, but mom reports chronic loose stools.   EMS reported the patient febrile and tachycardic on arrival.   ED course: Upon arrival patient tachycardic and lethargic. Sepsis protocol initiated with antibiotics and IVF resuscitation. Labs significant for mild hypokalemia, otherwise WNL. Initial imaging unremarkable but then patient became hypoxic with SpO2 85% on RA and a CTa was obtained, negative for PE but concerning for  aspiration pneumonia.   11/20/23: Admit to ICU due to acute hypoxic respiratory failure requiring urgent intubation and mechanical ventilation secondary to suspected aspiration pneumonia 11/21/23- patient moving RUE, mother at bedside we reviewed medical plan. He remains on 11mcg/kg/hr levophed, today plan to rescusitate more aggresively and wean from levophed and potentially extubate post SBP.   He is febrile this am.  He is on zithromax, unasyn 11/22/23- s/p bronch yesterday with aspiration of mucus plugging.  Today resp status improved with liberation protocol in proces. SLP post extubation today. 11/23/23- patient is +for pseudomonas resp cultures.  He is also MRSA pcr +, refined therapy during rounds with pharmacist. He remains on MV 11/24/23- patient is still on vasopressor support weaning down on MV.  Secretions are slightly better.  11/25/23- patient weaned off levophed, failed SBT with tachypnea, tachycardia.  Secretions much improved. 11/26/23- on minimal vent support, unable to perform SBT due to copious secretions from ETT.   11/27/23- on minimal vent support, secretions improved.  Change Doxycycline to Vancomycin.  Plan for SBT as tolerated. 11/28/23-Pt successfully extubated 02/4, currently tolerating HHFNC @35L /35%.  Required low dose precedex overnight to allow suctioning due to excessive secretions. Pt with suspected seizure activity developed tremors in the right upper extremity and became minimally responsive.  Received 1g of iv keppra.   11/29/23-Overnight pt developed sinus tachycardia/svt 140 to 160's improved following 2.5 mg iv metoprolol.  Tolerating RA with no signs of respiratory distress.  Transferring to progressive care unit TRH to pick on  02/7 12/01/23: Pt transferred back to ICU with severe acute hypoxic respiratory failure initially placed on HHFNC but due to significant hypoxia O2 sats in the 60's he required reintubation and underwent emergent bronchoscopy which revealed thick secretions  in the LUL and LLL resulting in mucous plugging. Therapeutic aspiration performed. CXR showed collapse of the LUL secondary to mucus plugging  12/03/23: Pt remains mechanically intubated on minimal vent settings.  ENT consulted for tracheostomy placement due to recurrent respiratory failure due to inability to clear secretions.  Requiring levophed gtt to maintain map 65 or higher  12/04/23: No acute events overnight on minimal settings pending tracheostomy placement  12/05/23: No acute events overnight, on minimal vent support. Initially on low dose Levophed, now weaned off.  IR placed PEG tube. Pending Tracheostomy placement on Friday. 12/06/23: No acute events overnight, on minimal vent support. Awaiting Tracheostomy placement tomorrow. 12/07/23: No acute events overnight.  Tracheostomy placed this morning by ENT, no events with procedure. Will keep sedated today with newly placed Trach with plan for WUA/SBT tomorrow. 12/08/23: All sedation turned off for WUA will perform SBT or TCT as tolerated.  Pt spike at temp 100.9 F, hypotensive requiring levophed gtt, and tachycardic concerning for possible sepsis.  Blood culture/tracheal aspirate and UA sent.  Restarted broad spectrum abx (vancomycin/zosyn) 12/09/23: Pt no longer requiring levophed gtt.  Tmax 102.4 F 12/10/23: Central Line Removed, not requiring pressors or sedation. Tolerating TC. 12/11/23: Overnight was started back on Gabapentin and scheduled Propanol for possible neurostorming with significant improvement in HR from the 170's to mid 100's 12/12/23: HR remains stable in low 100's, not requiring vasopressors, on minimal vent support, SBT as tolerated.  Complete course of ABX as suspect he is colonized with Pseudomonas and MRSA. 12/13/23: Overnight with concern for possible tube feeds in ETT tubing. Propanol was decreased due to soft BP. CXR this morning without new infiltrate, no leukocytosis or fever, and bowel pattern normal on KUB, will restart tube  feeds. Remains on minimal vent support, execise in PSV as tolerated ~ unable to tolerate less than 10/5 in PSV. 12/14/23: No significant events overnight.  Afebrile, hemodynamically stable.  On minimal vent support, continue with SBT/TCT as tolerated. 12/15/23: No change in patient's condition.  Remained on full vent support overnight.  Now on pressure support and tolerating. 12/16/23: Tolerated trach collar trials 12/17/23- patient awaiting placement.  He may have had another seizure and we will order EEG for him today.  Disposition likely difficult due to insurance qualification with trach.   2/25-3/4: TOC working on placement   I assumed care on 12/26/2023. 3/5: oxygen desaturations with pt care tasks per RN this morning.  CXR shows decreased inflation and volume loss of left hemithorax with possible mucus plugging of left mainstem bronchus.    Assessment and Plan:  Acute respiratory failure with hypoxia Patient required intubation on admission, later re-intubated as outlined above in hospital course.  Tracheostomy on 12/07/2023.   Has been tolerating trach collar. 3/5 -- O2 desaturations with movement during pt care tasks per RN. ?Mucus plugging on repeat CXR. Added chest PT. 3/6 -- weaned to room air --Continue chest PT for mucus plugging which has been a recurrent issue --O2 per protocol, maintain sat > 90%  Leukocytosis - WBC increased to 12.9 today, after acute O2 desaturations yesterday that resolved with chest PT.  Possibly reactive but will closely monitor for recurrent infection given his risk of aspiration. --Repeat CBC tomorrow --Monitor fever curve and clinically for s/sx's  of infection --Defer resuming antibiotics for now  Aspiration pneumonia Completed antibiotics.   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   Completed course of Zyvox and Zosyn. --Aspiration precautions --NPO --SLP consulted, last seen on 12/01/23 while intubated --On tube feeds, RD following    Tracheostomy/PEG status --SLP following - initiated PMV trial today, to assess swallow soon --Suctioning PRN --Monitor respiratory status --Tube feeds per dietitian orders  Hypotension --Continue increased dose of midodrine 15 mg 3 times daily.   Tachycardia-improved Related to TBI and "neurostorming" - has recurrent episodes of significant tachycardia --Continue Cardizem and metoprolol --PRN IV metoprolol if HR sustaining > 135 bpm   Septic shock - resolved Completed antibiotics.  Still on midodrine for blood pressure.   --Monitor fever curve and CBC   Acute metabolic encephalopathy Patient is alert, able to move his right arm. EEG showed moderate diffuse encephalopathy but no epileptiform discharges seen   Hypokalemia --Continue repletion and monitoring   Seizure disorder Recent EEG did not show any seizure activity. -- Continue valproic acid, gabapentin.     History of traumatic brain injury Bedbound at baseline, able to move his right arm. --Continue Baclofen, gabapentin, Robinul, Depakote, Seroquel   Reactive thrombocytosis --Monitor CBC   Pressure injury of  - POA.   See full description below, I agree with characterization. --Continue frequent repositioning, offloading pressure areas --Wound care --Monitor closely for signs of infection          Subjective: Pt awake when seen today.  No acute events or complaints reported.  Oxygen weaned off since yesterday. Mucus plugging seems to have resolved with chest PT.   Physical Exam: Vitals:   12/27/23 0519 12/27/23 0757 12/27/23 0828 12/27/23 1133  BP: 114/75 110/69  103/72  Pulse: (!) 124 (!) 134  (!) 125  Resp: 20 16  16   Temp: 100.2 F (37.9 C) 99 F (37.2 C)  98.8 F (37.1 C)  TempSrc: Axillary     SpO2: 98% 94% 98% 93%  Weight:      Height:       General exam: awake, alert, no acute distress HEENT: moist mucus membranes, hearing grossly normal  Respiratory system: improved breath sounds  overall clear with improved left side aeration, no wheezes, normal respiratory effort, trach collar on room air. Cardiovascular system: normal S1/S2, tachycardic, regular rhythm, +pedal edema.   Gastrointestinal system: soft, NT, ND Central nervous system: limited exam, hypertonic contractures of extremities, non-verbal Skin: dry, intact, normal temperature, no rashes seen on visualized skin   Data Reviewed:  Notable labs --  BMP normal except glucose 109, Cr 0.50  CBC with Hbg 12.7>>11.6, WBC 8.9 >> 12.9, platelets 513 >> 427k  Family Communication: None present  Disposition: Status is: Inpatient Remains inpatient appropriate because: Needs placement.     Planned Discharge Destination: Skilled nursing facility versus LTAC     Time spent: 42 minutes   Author: Pennie Banter, DO 12/27/2023 1:23 PM  For on call review [email or web address].
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
## 2023-12-27 NOTE — Plan of Care (Signed)
  Problem: Fluid Volume: Goal: Hemodynamic stability will improve Outcome: Progressing   Problem: Clinical Measurements: Goal: Diagnostic test results will improve Outcome: Progressing Goal: Signs and symptoms of infection will decrease Outcome: Progressing   Problem: Respiratory: Goal: Ability to maintain adequate ventilation will improve Outcome: Progressing   Problem: Activity: Goal: Ability to tolerate increased activity will improve Outcome: Progressing   Problem: Respiratory: Goal: Ability to maintain a clear airway and adequate ventilation will improve Outcome: Progressing   Problem: Role Relationship: Goal: Method of communication will improve Outcome: Progressing   Problem: Education: Goal: Knowledge of General Education information will improve Description: Including pain rating scale, medication(s)/side effects and non-pharmacologic comfort measures Outcome: Progressing   Problem: Health Behavior/Discharge Planning: Goal: Ability to manage health-related needs will improve Outcome: Progressing   Problem: Clinical Measurements: Goal: Ability to maintain clinical measurements within normal limits will improve Outcome: Progressing Goal: Will remain free from infection Outcome: Progressing Goal: Diagnostic test results will improve Outcome: Progressing Goal: Respiratory complications will improve Outcome: Progressing Goal: Cardiovascular complication will be avoided Outcome: Progressing   Problem: Activity: Goal: Risk for activity intolerance will decrease Outcome: Progressing   Problem: Nutrition: Goal: Adequate nutrition will be maintained Outcome: Progressing   Problem: Coping: Goal: Level of anxiety will decrease Outcome: Progressing   Problem: Elimination: Goal: Will not experience complications related to bowel motility Outcome: Progressing Goal: Will not experience complications related to urinary retention Outcome: Progressing   Problem:  Pain Managment: Goal: General experience of comfort will improve and/or be controlled Outcome: Progressing   Problem: Safety: Goal: Ability to remain free from injury will improve Outcome: Progressing   Problem: Skin Integrity: Goal: Risk for impaired skin integrity will decrease Outcome: Progressing   Problem: Activity: Goal: Ability to tolerate increased activity will improve Outcome: Progressing   Problem: Respiratory: Goal: Ability to maintain a clear airway and adequate ventilation will improve Outcome: Progressing   Problem: Role Relationship: Goal: Method of communication will improve Outcome: Progressing
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
## 2023-12-28 DIAGNOSIS — R000 Tachycardia, unspecified: Secondary | ICD-10-CM | POA: Diagnosis not present

## 2023-12-28 DIAGNOSIS — G40909 Epilepsy, unspecified, not intractable, without status epilepticus: Secondary | ICD-10-CM | POA: Diagnosis not present

## 2023-12-28 DIAGNOSIS — G9341 Metabolic encephalopathy: Secondary | ICD-10-CM | POA: Diagnosis not present

## 2023-12-28 DIAGNOSIS — Z87820 Personal history of traumatic brain injury: Secondary | ICD-10-CM | POA: Diagnosis not present

## 2023-12-28 LAB — GLUCOSE, CAPILLARY
Glucose-Capillary: 101 mg/dL — ABNORMAL HIGH (ref 70–99)
Glucose-Capillary: 107 mg/dL — ABNORMAL HIGH (ref 70–99)
Glucose-Capillary: 114 mg/dL — ABNORMAL HIGH (ref 70–99)
Glucose-Capillary: 136 mg/dL — ABNORMAL HIGH (ref 70–99)
Glucose-Capillary: 90 mg/dL (ref 70–99)

## 2023-12-28 LAB — CBC
HCT: 34.5 % — ABNORMAL LOW (ref 39.0–52.0)
Hemoglobin: 11.1 g/dL — ABNORMAL LOW (ref 13.0–17.0)
MCH: 30.6 pg (ref 26.0–34.0)
MCHC: 32.2 g/dL (ref 30.0–36.0)
MCV: 95 fL (ref 80.0–100.0)
Platelets: 338 10*3/uL (ref 150–400)
RBC: 3.63 MIL/uL — ABNORMAL LOW (ref 4.22–5.81)
RDW: 12.5 % (ref 11.5–15.5)
WBC: 5.9 10*3/uL (ref 4.0–10.5)
nRBC: 0 % (ref 0.0–0.2)
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
## 2023-12-28 NOTE — TOC Progression Note (Signed)
 Transition of Care [facility]) - Progression Note    Patient Details  Name: Alex [name] MRN: [id number] Date of Birth: 1996-08-03  Transition of Care [facility]) CM/SW Contact  Truddie Hidden, RN Phone Number: 12/28/2023, 4:00 PM  Clinical Narrative:    Attempt to reach [place] at [facility] @ [phone]. Message left regarding bed acceptance.  Per Kenney Houseman at [facility]. Facility would be able to accept patient back at closer to the end of the month after facility trainings have occurred.    Expected Discharge Plan: Skilled Nursing Facility Barriers to Discharge: Continued Medical Work up  Expected Discharge Plan and Services       Living arrangements for the past 2 months: Skilled Nursing Facility                                       Social Determinants of Health (SDOH) Interventions SDOH Screenings   Food Insecurity: Patient Unable To Answer (11/20/2023)  Housing: Patient Unable To Answer (11/20/2023)  Transportation Needs: Patient Unable To Answer (11/20/2023)  Utilities: Patient Unable To Answer (11/20/2023)  Financial Resource Strain: Low Risk  (12/02/2021)   Received from [facility]  Tobacco Use: Low Risk  (11/19/2023)    Readmission Risk Interventions     No data to display

## 2023-12-28 NOTE — Progress Notes (Signed)
 Progress Note   Patient: Alex [name] [id number]:[id number] DOB: 25-Apr-1996 DOA: 11/19/2023     39 DOS: the patient was seen and examined on 12/28/2023   Hospital course:  28 yo M presenting to [facility] ED from outpatient rehab on 11/19/23 for evaluation of altered mental status.   History obtained per chart review and mother's telephone interview, patient unable to participate in interview due to respiratory distress and baseline TBI. 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 He was treated at [facility] for 5 months then discharged to rehab. Per his mother and legal guardian his baseline is: Non verbal but he will yell out sporadic nonsensical words with left sided paralysis. He is able to move his RUE in order to feed himself with finger foods and he has some involuntary movement with that arm, he will swat at you. Similar baseline function with RLE, he can kick and move, but often will lay it bent and to the side. He has enough strength to even try and get out of bed on the right side. She denies any issues with swallowing, but eats mostly soft food. If he is not watched closely with eating he will try to eat all the food at once. The facility staff reported he was at his normal baseline until lunchtime on 11/19/23. It was observed he was more somnolent and not interactive. Staff noted a cough and slightly increased work of breathing. Mom also confirmed noting a congested cough the last time she visited earlier in the week. Staff and mom denied nausea/ vomiting, but mom reports chronic loose stools.   EMS reported the patient febrile and tachycardic on arrival.   ED course: Upon arrival patient tachycardic and lethargic. Sepsis protocol initiated with antibiotics and IVF resuscitation. Labs significant for mild hypokalemia, otherwise WNL. Initial imaging unremarkable but then patient became hypoxic with SpO2 85% on RA and a CTa was obtained, negative for PE but concerning for  aspiration pneumonia.   11/20/23: Admit to ICU due to acute hypoxic respiratory failure requiring urgent intubation and mechanical ventilation secondary to suspected aspiration pneumonia 11/21/23- patient moving RUE, mother at bedside we reviewed medical plan. He remains on 69mcg/kg/hr levophed, today plan to rescusitate more aggresively and wean from levophed and potentially extubate post SBP.   He is febrile this am.  He is on zithromax, unasyn 11/22/23- s/p bronch yesterday with aspiration of mucus plugging.  Today resp status improved with liberation protocol in proces. SLP post extubation today. 11/23/23- patient is +for pseudomonas resp cultures.  He is also MRSA pcr +, refined therapy during rounds with pharmacist. He remains on MV 11/24/23- patient is still on vasopressor support weaning down on MV.  Secretions are slightly better.  11/25/23- patient weaned off levophed, failed SBT with tachypnea, tachycardia.  Secretions much improved. 11/26/23- on minimal vent support, unable to perform SBT due to copious secretions from ETT.   11/27/23- on minimal vent support, secretions improved.  Change Doxycycline to Vancomycin.  Plan for SBT as tolerated. 11/28/23-Pt successfully extubated 02/4, currently tolerating HHFNC @35L /35%.  Required low dose precedex overnight to allow suctioning due to excessive secretions. Pt with suspected seizure activity developed tremors in the right upper extremity and became minimally responsive.  Received 1g of iv keppra.   11/29/23-Overnight pt developed sinus tachycardia/svt 140 to 160's improved following 2.5 mg iv metoprolol.  Tolerating RA with no signs of respiratory distress.  Transferring to progressive care unit TRH to pick on  02/7 12/01/23: Pt transferred back to ICU with severe acute hypoxic respiratory failure initially placed on HHFNC but due to significant hypoxia O2 sats in the 60's he required reintubation and underwent emergent bronchoscopy which revealed thick secretions  in the LUL and LLL resulting in mucous plugging. Therapeutic aspiration performed. CXR showed collapse of the LUL secondary to mucus plugging  12/03/23: Pt remains mechanically intubated on minimal vent settings.  ENT consulted for tracheostomy placement due to recurrent respiratory failure due to inability to clear secretions.  Requiring levophed gtt to maintain map 65 or higher  12/04/23: No acute events overnight on minimal settings pending tracheostomy placement  12/05/23: No acute events overnight, on minimal vent support. Initially on low dose Levophed, now weaned off.  IR placed PEG tube. Pending Tracheostomy placement on Friday. 12/06/23: No acute events overnight, on minimal vent support. Awaiting Tracheostomy placement tomorrow. 12/07/23: No acute events overnight.  Tracheostomy placed this morning by ENT, no events with procedure. Will keep sedated today with newly placed Trach with plan for WUA/SBT tomorrow. 12/08/23: All sedation turned off for WUA will perform SBT or TCT as tolerated.  Pt spike at temp 100.9 F, hypotensive requiring levophed gtt, and tachycardic concerning for possible sepsis.  Blood culture/tracheal aspirate and UA sent.  Restarted broad spectrum abx (vancomycin/zosyn) 12/09/23: Pt no longer requiring levophed gtt.  Tmax 102.4 F 12/10/23: Central Line Removed, not requiring pressors or sedation. Tolerating TC. 12/11/23: Overnight was started back on Gabapentin and scheduled Propanol for possible neurostorming with significant improvement in HR from the 170's to mid 100's 12/12/23: HR remains stable in low 100's, not requiring vasopressors, on minimal vent support, SBT as tolerated.  Complete course of ABX as suspect he is colonized with Pseudomonas and MRSA. 12/13/23: Overnight with concern for possible tube feeds in ETT tubing. Propanol was decreased due to soft BP. CXR this morning without new infiltrate, no leukocytosis or fever, and bowel pattern normal on KUB, will restart tube  feeds. Remains on minimal vent support, execise in PSV as tolerated ~ unable to tolerate less than 10/5 in PSV. 12/14/23: No significant events overnight.  Afebrile, hemodynamically stable.  On minimal vent support, continue with SBT/TCT as tolerated. 12/15/23: No change in patient's condition.  Remained on full vent support overnight.  Now on pressure support and tolerating. 12/16/23: Tolerated trach collar trials 12/17/23- patient awaiting placement.  He may have had another seizure and we will order EEG for him today.  Disposition likely difficult due to insurance qualification with trach.   2/25-3/4: TOC working on placement   I assumed care on 12/26/2023. 3/5: oxygen desaturations with pt care tasks per RN this morning.  CXR shows decreased inflation and volume loss of left hemithorax with possible mucus plugging of left mainstem bronchus.    Assessment and Plan:  Acute respiratory failure with hypoxia Patient required intubation on admission, later re-intubated as outlined above in hospital course.  Tracheostomy on 12/07/2023.   Has been tolerating trach collar. 3/5 -- O2 desaturations with movement during pt care tasks per RN. ?Mucus plugging on repeat CXR. Added chest PT. 3/6 -- weaned to room air --Continue chest PT for mucus plugging which has been a recurrent issue --O2 per protocol, maintain sat > 90%  Leukocytosis - RESOLVED - WBC increased to 12.9 on 3/6, after acute O2 desaturations day before that resolved with chest PT.   Possibly reactive but will closely monitor for recurrent infection given his risk of aspiration. 3/7 -- WBC normalized 12.9 >>  5.9 --Monitor fever curve and clinically for s/sx's of infection --Defer resuming antibiotics for now  Aspiration pneumonia Completed antibiotics.   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   Completed course of Zyvox and Zosyn. --Aspiration precautions --NPO --SLP consulted, last seen on 12/01/23 while intubated --On  tube feeds, RD following   Tracheostomy/PEG status --SLP following - initiated PMV trial today, to assess swallow soon --Suctioning PRN --Monitor respiratory status --Tube feeds per dietitian orders  Hypotension --Continue increased dose of midodrine 15 mg 3 times daily.   Tachycardia-improved Related to TBI and "neurostorming" - has recurrent episodes of significant tachycardia --Continue Cardizem and metoprolol --PRN IV metoprolol if HR sustaining > 135 bpm   Septic shock - resolved Completed antibiotics.  Still on midodrine for blood pressure.   --Monitor fever curve and CBC   Acute metabolic encephalopathy Patient is alert, able to move his right arm. EEG showed moderate diffuse encephalopathy but no epileptiform discharges seen   Hypokalemia --Continue repletion and monitoring   Seizure disorder Recent EEG did not show any seizure activity. -- Continue valproic acid, gabapentin.     History of traumatic brain injury Bedbound at baseline, able to move his right arm. --Continue Baclofen, gabapentin, Robinul, Depakote, Seroquel   Reactive thrombocytosis --Monitor CBC   Pressure injury of  - POA.   See full description below, I agree with characterization. --Continue frequent repositioning, offloading pressure areas --Wound care --Monitor closely for signs of infection          Subjective: Pt awake when seen today, laying in feces.  He is awake, non-verbal.  No acute events or complaints reported.   Physical Exam: Vitals:   12/28/23 0757 12/28/23 0815 12/28/23 1059 12/28/23 1144  BP:  108/64  (!) 91/58  Pulse: (!) 102 (!) 107 (!) 117 98  Resp: 18 19 18 19   Temp:  98.3 F (36.8 C)  97.8 F (36.6 C)  TempSrc:      SpO2: 97% 97% 97% 97%  Weight:      Height:       General exam: awake, alert, no acute distress HEENT: moist mucus membranes, hearing grossly normal  Respiratory system: improved breath sounds overall clear with improved left side  aeration, no wheezes, normal respiratory effort, trach collar on room air. Cardiovascular system: normal S1/S2, tachycardic, regular rhythm, +pedal edema.   Gastrointestinal system: soft, NT, ND Central nervous system: limited exam, hypertonic contractures of extremities, non-verbal Skin: dry, intact, normal temperature, no rashes seen on visualized skin   Data Reviewed:  Notable labs --  Hbg stable 11.1 CBG's in 100's to 120's  Family Communication: None present  Disposition: Status is: Inpatient Remains inpatient appropriate because: Needs placement - LTAC or return to LTC.    3/7 - per TOC, cannot return to [facility] until end of the month.  Att   Planned Discharge Destination: Skilled nursing facility versus LTAC     Time spent: 38 minutes   Author: Pennie Banter, DO 12/28/2023 12:54 PM  For on call review [email or web address].

## 2023-12-28 NOTE — Plan of Care (Signed)
  Problem: Fluid Volume: Goal: Hemodynamic stability will improve Outcome: Progressing   Problem: Clinical Measurements: Goal: Diagnostic test results will improve Outcome: Progressing Goal: Signs and symptoms of infection will decrease Outcome: Progressing   Problem: Respiratory: Goal: Ability to maintain adequate ventilation will improve Outcome: Progressing   Problem: Activity: Goal: Ability to tolerate increased activity will improve Outcome: Progressing   Problem: Respiratory: Goal: Ability to maintain a clear airway and adequate ventilation will improve Outcome: Progressing   Problem: Role Relationship: Goal: Method of communication will improve Outcome: Progressing   Problem: Education: Goal: Knowledge of General Education information will improve Description: Including pain rating scale, medication(s)/side effects and non-pharmacologic comfort measures Outcome: Progressing   Problem: Health Behavior/Discharge Planning: Goal: Ability to manage health-related needs will improve Outcome: Progressing   Problem: Clinical Measurements: Goal: Ability to maintain clinical measurements within normal limits will improve Outcome: Progressing Goal: Will remain free from infection Outcome: Progressing Goal: Diagnostic test results will improve Outcome: Progressing Goal: Respiratory complications will improve Outcome: Progressing Goal: Cardiovascular complication will be avoided Outcome: Progressing   Problem: Activity: Goal: Risk for activity intolerance will decrease Outcome: Progressing   Problem: Nutrition: Goal: Adequate nutrition will be maintained Outcome: Progressing   Problem: Coping: Goal: Level of anxiety will decrease Outcome: Progressing   Problem: Elimination: Goal: Will not experience complications related to bowel motility Outcome: Progressing Goal: Will not experience complications related to urinary retention Outcome: Progressing   Problem:  Pain Managment: Goal: General experience of comfort will improve and/or be controlled Outcome: Progressing   Problem: Safety: Goal: Ability to remain free from injury will improve Outcome: Progressing   Problem: Skin Integrity: Goal: Risk for impaired skin integrity will decrease Outcome: Progressing   Problem: Activity: Goal: Ability to tolerate increased activity will improve Outcome: Progressing   Problem: Respiratory: Goal: Ability to maintain a clear airway and adequate ventilation will improve Outcome: Progressing   Problem: Role Relationship: Goal: Method of communication will improve Outcome: Progressing

## 2023-12-29 DIAGNOSIS — Z87820 Personal history of traumatic brain injury: Secondary | ICD-10-CM | POA: Diagnosis not present

## 2023-12-29 DIAGNOSIS — J690 Pneumonitis due to inhalation of food and vomit: Secondary | ICD-10-CM | POA: Diagnosis not present

## 2023-12-29 DIAGNOSIS — G40909 Epilepsy, unspecified, not intractable, without status epilepticus: Secondary | ICD-10-CM | POA: Diagnosis not present

## 2023-12-29 DIAGNOSIS — R000 Tachycardia, unspecified: Secondary | ICD-10-CM | POA: Diagnosis not present

## 2023-12-29 LAB — GLUCOSE, CAPILLARY
Glucose-Capillary: 98 mg/dL (ref 70–99)
Glucose-Capillary: 146 mg/dL — ABNORMAL HIGH (ref 70–99)

## 2023-12-29 NOTE — Plan of Care (Signed)
  Problem: Fluid Volume: Goal: Hemodynamic stability will improve Outcome: Progressing   Problem: Clinical Measurements: Goal: Diagnostic test results will improve Outcome: Progressing Goal: Signs and symptoms of infection will decrease Outcome: Progressing   Problem: Respiratory: Goal: Ability to maintain adequate ventilation will improve Outcome: Progressing   Problem: Activity: Goal: Ability to tolerate increased activity will improve Outcome: Progressing   Problem: Respiratory: Goal: Ability to maintain a clear airway and adequate ventilation will improve Outcome: Progressing   Problem: Role Relationship: Goal: Method of communication will improve Outcome: Progressing   Problem: Education: Goal: Knowledge of General Education information will improve Description: Including pain rating scale, medication(s)/side effects and non-pharmacologic comfort measures Outcome: Progressing   Problem: Health Behavior/Discharge Planning: Goal: Ability to manage health-related needs will improve Outcome: Progressing   Problem: Clinical Measurements: Goal: Ability to maintain clinical measurements within normal limits will improve Outcome: Progressing Goal: Will remain free from infection Outcome: Progressing Goal: Diagnostic test results will improve Outcome: Progressing Goal: Respiratory complications will improve Outcome: Progressing Goal: Cardiovascular complication will be avoided Outcome: Progressing   Problem: Activity: Goal: Risk for activity intolerance will decrease Outcome: Progressing   Problem: Nutrition: Goal: Adequate nutrition will be maintained Outcome: Progressing   Problem: Coping: Goal: Level of anxiety will decrease Outcome: Progressing   Problem: Elimination: Goal: Will not experience complications related to bowel motility Outcome: Progressing Goal: Will not experience complications related to urinary retention Outcome: Progressing   Problem:  Pain Managment: Goal: General experience of comfort will improve and/or be controlled Outcome: Progressing   Problem: Safety: Goal: Ability to remain free from injury will improve Outcome: Progressing   Problem: Skin Integrity: Goal: Risk for impaired skin integrity will decrease Outcome: Progressing   Problem: Activity: Goal: Ability to tolerate increased activity will improve Outcome: Progressing   Problem: Respiratory: Goal: Ability to maintain a clear airway and adequate ventilation will improve Outcome: Progressing   Problem: Role Relationship: Goal: Method of communication will improve Outcome: Progressing

## 2023-12-29 NOTE — Progress Notes (Signed)
 Progress Note   Patient: Alex [name] ZOX:[id number] DOB: 1996/02/01 DOA: 11/19/2023     40 DOS: the patient was seen and examined on 12/29/2023   Hospital course:  28 yo M presenting to [facility] ED from outpatient rehab on 11/19/23 for evaluation of altered mental status.   History obtained per chart review and mother's telephone interview, patient unable to participate in interview due to respiratory distress and baseline TBI. 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 He was treated at [facility] for 5 months then discharged to rehab. Per his mother and legal guardian his baseline is: Non verbal but he will yell out sporadic nonsensical words with left sided paralysis. He is able to move his RUE in order to feed himself with finger foods and he has some involuntary movement with that arm, he will swat at you. Similar baseline function with RLE, he can kick and move, but often will lay it bent and to the side. He has enough strength to even try and get out of bed on the right side. She denies any issues with swallowing, but eats mostly soft food. If he is not watched closely with eating he will try to eat all the food at once. The facility staff reported he was at his normal baseline until lunchtime on 11/19/23. It was observed he was more somnolent and not interactive. Staff noted a cough and slightly increased work of breathing. Mom also confirmed noting a congested cough the last time she visited earlier in the week. Staff and mom denied nausea/ vomiting, but mom reports chronic loose stools.   EMS reported the patient febrile and tachycardic on arrival.   ED course: Upon arrival patient tachycardic and lethargic. Sepsis protocol initiated with antibiotics and IVF resuscitation. Labs significant for mild hypokalemia, otherwise WNL. Initial imaging unremarkable but then patient became hypoxic with SpO2 85% on RA and a CTa was obtained, negative for PE but concerning for  aspiration pneumonia.   11/20/23: Admit to ICU due to acute hypoxic respiratory failure requiring urgent intubation and mechanical ventilation secondary to suspected aspiration pneumonia 11/21/23- patient moving RUE, mother at bedside we reviewed medical plan. He remains on 88mcg/kg/hr levophed, today plan to rescusitate more aggresively and wean from levophed and potentially extubate post SBP.   He is febrile this am.  He is on zithromax, unasyn 11/22/23- s/p bronch yesterday with aspiration of mucus plugging.  Today resp status improved with liberation protocol in proces. SLP post extubation today. 11/23/23- patient is +for pseudomonas resp cultures.  He is also MRSA pcr +, refined therapy during rounds with pharmacist. He remains on MV 11/24/23- patient is still on vasopressor support weaning down on MV.  Secretions are slightly better.  11/25/23- patient weaned off levophed, failed SBT with tachypnea, tachycardia.  Secretions much improved. 11/26/23- on minimal vent support, unable to perform SBT due to copious secretions from ETT.   11/27/23- on minimal vent support, secretions improved.  Change Doxycycline to Vancomycin.  Plan for SBT as tolerated. 11/28/23-Pt successfully extubated 02/4, currently tolerating HHFNC @35L /35%.  Required low dose precedex overnight to allow suctioning due to excessive secretions. Pt with suspected seizure activity developed tremors in the right upper extremity and became minimally responsive.  Received 1g of iv keppra.   11/29/23-Overnight pt developed sinus tachycardia/svt 140 to 160's improved following 2.5 mg iv metoprolol.  Tolerating RA with no signs of respiratory distress.  Transferring to progressive care unit TRH to pick on  02/7 12/01/23: Pt transferred back to ICU with severe acute hypoxic respiratory failure initially placed on HHFNC but due to significant hypoxia O2 sats in the 60's he required reintubation and underwent emergent bronchoscopy which revealed thick secretions  in the LUL and LLL resulting in mucous plugging. Therapeutic aspiration performed. CXR showed collapse of the LUL secondary to mucus plugging  12/03/23: Pt remains mechanically intubated on minimal vent settings.  ENT consulted for tracheostomy placement due to recurrent respiratory failure due to inability to clear secretions.  Requiring levophed gtt to maintain map 65 or higher  12/04/23: No acute events overnight on minimal settings pending tracheostomy placement  12/05/23: No acute events overnight, on minimal vent support. Initially on low dose Levophed, now weaned off.  IR placed PEG tube. Pending Tracheostomy placement on Friday. 12/06/23: No acute events overnight, on minimal vent support. Awaiting Tracheostomy placement tomorrow. 12/07/23: No acute events overnight.  Tracheostomy placed this morning by ENT, no events with procedure. Will keep sedated today with newly placed Trach with plan for WUA/SBT tomorrow. 12/08/23: All sedation turned off for WUA will perform SBT or TCT as tolerated.  Pt spike at temp 100.9 F, hypotensive requiring levophed gtt, and tachycardic concerning for possible sepsis.  Blood culture/tracheal aspirate and UA sent.  Restarted broad spectrum abx (vancomycin/zosyn) 12/09/23: Pt no longer requiring levophed gtt.  Tmax 102.4 F 12/10/23: Central Line Removed, not requiring pressors or sedation. Tolerating TC. 12/11/23: Overnight was started back on Gabapentin and scheduled Propanol for possible neurostorming with significant improvement in HR from the 170's to mid 100's 12/12/23: HR remains stable in low 100's, not requiring vasopressors, on minimal vent support, SBT as tolerated.  Complete course of ABX as suspect he is colonized with Pseudomonas and MRSA. 12/13/23: Overnight with concern for possible tube feeds in ETT tubing. Propanol was decreased due to soft BP. CXR this morning without new infiltrate, no leukocytosis or fever, and bowel pattern normal on KUB, will restart tube  feeds. Remains on minimal vent support, execise in PSV as tolerated ~ unable to tolerate less than 10/5 in PSV. 12/14/23: No significant events overnight.  Afebrile, hemodynamically stable.  On minimal vent support, continue with SBT/TCT as tolerated. 12/15/23: No change in patient's condition.  Remained on full vent support overnight.  Now on pressure support and tolerating. 12/16/23: Tolerated trach collar trials 12/17/23- patient awaiting placement.  He may have had another seizure and we will order EEG for him today.  Disposition likely difficult due to insurance qualification with trach.   2/25-3/4: TOC working on placement   I assumed care on 12/26/2023. 3/5: oxygen desaturations with pt care tasks per RN this morning.  CXR shows decreased inflation and volume loss of left hemithorax with possible mucus plugging of left mainstem bronchus.    Assessment and Plan:  Acute respiratory failure with hypoxia Patient required intubation on admission, later re-intubated as outlined above in hospital course.  Tracheostomy on 12/07/2023.   Has been tolerating trach collar. 3/5 -- O2 desaturations with movement during pt care tasks per RN. ?Mucus plugging on repeat CXR. Added chest PT. 3/6 -- weaned to room air --Continue chest PT for mucus plugging which has been a recurrent issue --O2 per protocol, maintain sat > 90%  Leukocytosis - RESOLVED - WBC increased to 12.9 on 3/6, after acute O2 desaturations day before that resolved with chest PT.   Possibly reactive but will closely monitor for recurrent infection given his risk of aspiration. 3/7 -- WBC normalized 12.9 >>  5.9 --Monitor fever curve and clinically for s/sx's of infection --Defer resuming antibiotics for now  Aspiration pneumonia Completed antibiotics.   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   Completed course of Zyvox and Zosyn. --Aspiration precautions --NPO --SLP consulted, last seen on 12/01/23 while intubated --On  tube feeds, RD following   Tracheostomy/PEG status --SLP following - initiated PMV trial today, to assess swallow soon --Suctioning PRN --Monitor respiratory status --Tube feeds per dietitian orders  Hypotension --Continue increased dose of midodrine 15 mg 3 times daily.   Tachycardia-improved Related to TBI and "neurostorming" - has recurrent episodes of significant tachycardia --Continue Cardizem and metoprolol --PRN IV metoprolol if HR sustaining > 135 bpm   Septic shock - resolved Completed antibiotics.  Still on midodrine for blood pressure.   --Monitor fever curve and CBC   Acute metabolic encephalopathy Patient is alert, able to move his right arm. EEG showed moderate diffuse encephalopathy but no epileptiform discharges seen   Hypokalemia --Continue repletion and monitoring   Seizure disorder Recent EEG did not show any seizure activity. -- Continue valproic acid, gabapentin.     History of traumatic brain injury Bedbound at baseline, able to move his right arm. --Continue Baclofen, gabapentin, Robinul, Depakote, Seroquel   Reactive thrombocytosis --Monitor CBC   Pressure injury of  - POA.   See full description below, I agree with characterization. --Continue frequent repositioning, offloading pressure areas --Wound care --Monitor closely for signs of infection          Subjective: Pt awake when seen today.  Non-verbal.  Calm during my encounter, allowed me to examine.  No acute events reported.  O2 sats stable.   Physical Exam: Vitals:   12/29/23 0313 12/29/23 0500 12/29/23 0824 12/29/23 0840  BP: 106/72   99/68  Pulse: (!) 118  91 90  Resp: 14  17 16   Temp: 98.3 F (36.8 C)   98.6 F (37 C)  TempSrc: Axillary     SpO2: 97%  97% 99%  Weight:  66.5 kg    Height:       General exam: awake, alert, no acute distress HEENT: moist mucus membranes, hearing grossly normal  Respiratory system: lungs overall clear with intermittent referred upper  airway secretion sounds, no wheezes, normal respiratory effort, trach collar on 5L O2 Cardiovascular system: normal S1/S2, tachycardic, regular rhythm, +pedal edema.   Gastrointestinal system: soft, NT, ND Central nervous system: limited exam, hypertonic contractures of extremities, non-verbal Skin: dry, intact, normal temperature, no rashes seen on visualized skin   Data Reviewed: No new labs today  Notable labs 3/7 --  Hbg stable 11.1 CBG's in 100's to 120's  Family Communication: None present  Disposition: Status is: Inpatient Remains inpatient appropriate because: Needs placement - LTAC or return to prior LTC.    3/7 - per TOC, cannot return to LTC at [facility] until end of the month.    Planned Discharge Destination: Skilled nursing facility versus LTAC     Time spent: 38 minutes   Author: Pennie Banter, DO 12/29/2023 11:34 AM  For on call review [email or web address].

## 2023-12-30 DIAGNOSIS — J690 Pneumonitis due to inhalation of food and vomit: Secondary | ICD-10-CM | POA: Diagnosis not present

## 2023-12-30 DIAGNOSIS — Z87820 Personal history of traumatic brain injury: Secondary | ICD-10-CM | POA: Diagnosis not present

## 2023-12-30 DIAGNOSIS — G40909 Epilepsy, unspecified, not intractable, without status epilepticus: Secondary | ICD-10-CM | POA: Diagnosis not present

## 2023-12-30 DIAGNOSIS — J9601 Acute respiratory failure with hypoxia: Secondary | ICD-10-CM | POA: Diagnosis not present

## 2023-12-30 LAB — GLUCOSE, CAPILLARY
Glucose-Capillary: 110 mg/dL — ABNORMAL HIGH (ref 70–99)
Glucose-Capillary: 118 mg/dL — ABNORMAL HIGH (ref 70–99)

## 2023-12-30 NOTE — Plan of Care (Signed)
  Problem: Respiratory: Goal: Ability to maintain adequate ventilation will improve 12/30/2023 0320 by Resa Miner, RN Outcome: Progressing 12/30/2023 0320 by Resa Miner, RN Outcome: Progressing   Problem: Clinical Measurements: Goal: Ability to maintain clinical measurements within normal limits will improve 12/30/2023 0320 by Resa Miner, RN Outcome: Progressing 12/30/2023 0320 by Resa Miner, RN Outcome: Progressing   Problem: Pain Managment: Goal: General experience of comfort will improve and/or be controlled Outcome: Progressing   Problem: Safety: Goal: Ability to remain free from injury will improve Outcome: Progressing   Problem: Skin Integrity: Goal: Risk for impaired skin integrity will decrease Outcome: Progressing

## 2023-12-30 NOTE — Progress Notes (Signed)
 Progress Note   Patient: Alex [name] ZOX:[id number] DOB: [birth year] DOA: 11/19/2023     41 DOS: the patient was seen and examined on 12/30/2023   Hospital course:  28 yo M presenting to [facility] ED from outpatient rehab on 11/19/23 for evaluation of altered mental status.   History obtained per chart review and mother's telephone interview, patient unable to participate in interview due to respiratory distress and baseline TBI. 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 He was treated at [facility] for 5 months then discharged to rehab. Per his mother and legal guardian his baseline is: Non verbal but he will yell out sporadic nonsensical words with left sided paralysis. He is able to move his RUE in order to feed himself with finger foods and he has some involuntary movement with that arm, he will swat at you. Similar baseline function with RLE, he can kick and move, but often will lay it bent and to the side. He has enough strength to even try and get out of bed on the right side. She denies any issues with swallowing, but eats mostly soft food. If he is not watched closely with eating he will try to eat all the food at once. The facility staff reported he was at his normal baseline until lunchtime on 11/19/23. It was observed he was more somnolent and not interactive. Staff noted a cough and slightly increased work of breathing. Mom also confirmed noting a congested cough the last time she visited earlier in the week. Staff and mom denied nausea/ vomiting, but mom reports chronic loose stools.   EMS reported the patient febrile and tachycardic on arrival.   ED course: Upon arrival patient tachycardic and lethargic. Sepsis protocol initiated with antibiotics and IVF resuscitation. Labs significant for mild hypokalemia, otherwise WNL. Initial imaging unremarkable but then patient became hypoxic with SpO2 85% on RA and a CTa was obtained, negative for PE but concerning for  aspiration pneumonia.   11/20/23: Admit to ICU due to acute hypoxic respiratory failure requiring urgent intubation and mechanical ventilation secondary to suspected aspiration pneumonia 11/21/23- patient moving RUE, mother at bedside we reviewed medical plan. He remains on 57mcg/kg/hr levophed, today plan to rescusitate more aggresively and wean from levophed and potentially extubate post SBP.   He is febrile this am.  He is on zithromax, unasyn 11/22/23- s/p bronch yesterday with aspiration of mucus plugging.  Today resp status improved with liberation protocol in proces. SLP post extubation today. 11/23/23- patient is +for pseudomonas resp cultures.  He is also MRSA pcr +, refined therapy during rounds with pharmacist. He remains on MV 11/24/23- patient is still on vasopressor support weaning down on MV.  Secretions are slightly better.  11/25/23- patient weaned off levophed, failed SBT with tachypnea, tachycardia.  Secretions much improved. 11/26/23- on minimal vent support, unable to perform SBT due to copious secretions from ETT.   11/27/23- on minimal vent support, secretions improved.  Change Doxycycline to Vancomycin.  Plan for SBT as tolerated. 11/28/23-Pt successfully extubated 02/4, currently tolerating HHFNC @35L /35%.  Required low dose precedex overnight to allow suctioning due to excessive secretions. Pt with suspected seizure activity developed tremors in the right upper extremity and became minimally responsive.  Received 1g of iv keppra.   11/29/23-Overnight pt developed sinus tachycardia/svt 140 to 160's improved following 2.5 mg iv metoprolol.  Tolerating RA with no signs of respiratory distress.  Transferring to progressive care unit TRH to pick on  02/7 12/01/23: Pt transferred back to ICU with severe acute hypoxic respiratory failure initially placed on HHFNC but due to significant hypoxia O2 sats in the 60's he required reintubation and underwent emergent bronchoscopy which revealed thick secretions  in the LUL and LLL resulting in mucous plugging. Therapeutic aspiration performed. CXR showed collapse of the LUL secondary to mucus plugging  12/03/23: Pt remains mechanically intubated on minimal vent settings.  ENT consulted for tracheostomy placement due to recurrent respiratory failure due to inability to clear secretions.  Requiring levophed gtt to maintain map 65 or higher  12/04/23: No acute events overnight on minimal settings pending tracheostomy placement  12/05/23: No acute events overnight, on minimal vent support. Initially on low dose Levophed, now weaned off.  IR placed PEG tube. Pending Tracheostomy placement on Friday. 12/06/23: No acute events overnight, on minimal vent support. Awaiting Tracheostomy placement tomorrow. 12/07/23: No acute events overnight.  Tracheostomy placed this morning by ENT, no events with procedure. Will keep sedated today with newly placed Trach with plan for WUA/SBT tomorrow. 12/08/23: All sedation turned off for WUA will perform SBT or TCT as tolerated.  Pt spike at temp 100.9 F, hypotensive requiring levophed gtt, and tachycardic concerning for possible sepsis.  Blood culture/tracheal aspirate and UA sent.  Restarted broad spectrum abx (vancomycin/zosyn) 12/09/23: Pt no longer requiring levophed gtt.  Tmax 102.4 F 12/10/23: Central Line Removed, not requiring pressors or sedation. Tolerating TC. 12/11/23: Overnight was started back on Gabapentin and scheduled Propanol for possible neurostorming with significant improvement in HR from the 170's to mid 100's 12/12/23: HR remains stable in low 100's, not requiring vasopressors, on minimal vent support, SBT as tolerated.  Complete course of ABX as suspect he is colonized with Pseudomonas and MRSA. 12/13/23: Overnight with concern for possible tube feeds in ETT tubing. Propanol was decreased due to soft BP. CXR this morning without new infiltrate, no leukocytosis or fever, and bowel pattern normal on KUB, will restart tube  feeds. Remains on minimal vent support, execise in PSV as tolerated ~ unable to tolerate less than 10/5 in PSV. 12/14/23: No significant events overnight.  Afebrile, hemodynamically stable.  On minimal vent support, continue with SBT/TCT as tolerated. 12/15/23: No change in patient's condition.  Remained on full vent support overnight.  Now on pressure support and tolerating. 12/16/23: Tolerated trach collar trials 12/17/23- patient awaiting placement.  He may have had another seizure and we will order EEG for him today.  Disposition likely difficult due to insurance qualification with trach.   2/25-3/4: TOC working on placement   I assumed care on 12/26/2023. 3/5: oxygen desaturations with pt care tasks per RN this morning.  CXR shows decreased inflation and volume loss of left hemithorax with possible mucus plugging of left mainstem bronchus.    Assessment and Plan:  Acute respiratory failure with hypoxia Patient required intubation on admission, later re-intubated as outlined above in hospital course.  Tracheostomy on 12/07/2023.   Has been tolerating trach collar. 3/5 -- O2 desaturations with movement during pt care tasks per RN. ?Mucus plugging on repeat CXR. Added chest PT. 3/6 -- weaned to room air --Continue chest PT for mucus plugging which has been a recurrent issue --O2 per protocol, maintain sat > 90%  Leukocytosis - RESOLVED - WBC increased to 12.9 on 3/6, after acute O2 desaturations day before that resolved with chest PT.   Possibly reactive but will closely monitor for recurrent infection given his risk of aspiration. 3/7 -- WBC normalized 12.9 >>  5.9 --Monitor fever curve and clinically for s/sx's of infection --Defer resuming antibiotics for now  Aspiration pneumonia Completed antibiotics.   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   Completed course of Zyvox and Zosyn. --Aspiration precautions --NPO --SLP consulted, last seen on 12/01/23 while intubated --On  tube feeds, RD following   Tracheostomy/PEG status --SLP following - initiated PMV trial today, to assess swallow soon --Suctioning PRN --Monitor respiratory status --Tube feeds per dietitian orders  Hypotension --Continue increased dose of midodrine 15 mg 3 times daily.   Tachycardia-improved Related to TBI and "neurostorming" - has recurrent episodes of significant tachycardia --Continue Cardizem and metoprolol --PRN IV metoprolol if HR sustaining > 135 bpm   Septic shock - resolved Completed antibiotics.  Still on midodrine for blood pressure.   --Monitor fever curve and CBC   Acute metabolic encephalopathy Patient is alert, able to move his right arm. EEG showed moderate diffuse encephalopathy but no epileptiform discharges seen   Hypokalemia --Continue repletion and monitoring   Seizure disorder Recent EEG did not show any seizure activity. -- Continue valproic acid, gabapentin.     History of traumatic brain injury Bedbound at baseline, able to move his right arm. --Continue Baclofen, gabapentin, Robinul, Depakote, Seroquel   Reactive thrombocytosis --Monitor CBC   Pressure injury of  - POA.   See full description below, I agree with characterization. --Continue frequent repositioning, offloading pressure areas --Wound care --Monitor closely for signs of infection          Subjective: Pt awake when seen today.  Non-verbal.  Calm for me during exam.  No acute events reported.   Physical Exam: Vitals:   12/29/23 2308 12/30/23 0405 12/30/23 0538 12/30/23 1152  BP: 99/61  95/70 104/62  Pulse: 90  (!) 105 (!) 109  Resp: 20  18 16   Temp: 98.2 F (36.8 C)  98.3 F (36.8 C) 98 F (36.7 C)  TempSrc:   Axillary Axillary  SpO2: 96%  95% 99%  Weight:  65.7 kg    Height:       General exam: awake, alert, no acute distress HEENT: moist mucus membranes, hearing grossly normal  Respiratory system: lungs overall clear with intermittent referred upper airway  secretion sounds, no wheezes, normal respiratory effort, trach collar on 5L O2 Cardiovascular system: normal S1/S2, tachycardic, regular rhythm, +pedal edema.   Gastrointestinal system: soft, NT, ND Central nervous system: limited exam, hypertonic contractures of extremities, non-verbal Skin: dry, intact, normal temperature, no rashes seen on visualized skin   Data Reviewed: No new labs today  Notable labs 3/7 --  Hbg stable 11.1 CBG's in 100's to 120's  Family Communication: None present  Disposition: Status is: Inpatient Remains inpatient appropriate because: Needs placement - LTAC or return to prior LTC.    3/7 - per TOC, cannot return to LTC at [facility] until end of the month.    Planned Discharge Destination: Skilled nursing facility versus LTAC     Time spent: 35 minutes   Author: Pennie Banter, DO 12/30/2023 2:34 PM  For on call review [email or web address].

## 2023-12-30 NOTE — Plan of Care (Signed)
  Problem: Fluid Volume: Goal: Hemodynamic stability will improve Outcome: Progressing   Problem: Clinical Measurements: Goal: Diagnostic test results will improve Outcome: Progressing Goal: Signs and symptoms of infection will decrease Outcome: Progressing   Problem: Respiratory: Goal: Ability to maintain adequate ventilation will improve Outcome: Progressing   Problem: Activity: Goal: Ability to tolerate increased activity will improve Outcome: Progressing   Problem: Respiratory: Goal: Ability to maintain a clear airway and adequate ventilation will improve Outcome: Progressing   Problem: Role Relationship: Goal: Method of communication will improve Outcome: Progressing   Problem: Education: Goal: Knowledge of General Education information will improve Description: Including pain rating scale, medication(s)/side effects and non-pharmacologic comfort measures Outcome: Progressing   Problem: Health Behavior/Discharge Planning: Goal: Ability to manage health-related needs will improve Outcome: Progressing   Problem: Clinical Measurements: Goal: Ability to maintain clinical measurements within normal limits will improve Outcome: Progressing Goal: Will remain free from infection Outcome: Progressing Goal: Diagnostic test results will improve Outcome: Progressing Goal: Respiratory complications will improve Outcome: Progressing Goal: Cardiovascular complication will be avoided Outcome: Progressing   Problem: Activity: Goal: Risk for activity intolerance will decrease Outcome: Progressing   Problem: Nutrition: Goal: Adequate nutrition will be maintained Outcome: Progressing   Problem: Coping: Goal: Level of anxiety will decrease Outcome: Progressing   Problem: Elimination: Goal: Will not experience complications related to bowel motility Outcome: Progressing Goal: Will not experience complications related to urinary retention Outcome: Progressing   Problem:  Pain Managment: Goal: General experience of comfort will improve and/or be controlled Outcome: Progressing   Problem: Safety: Goal: Ability to remain free from injury will improve Outcome: Progressing   Problem: Skin Integrity: Goal: Risk for impaired skin integrity will decrease Outcome: Progressing   Problem: Activity: Goal: Ability to tolerate increased activity will improve Outcome: Progressing   Problem: Respiratory: Goal: Ability to maintain a clear airway and adequate ventilation will improve Outcome: Progressing   Problem: Role Relationship: Goal: Method of communication will improve Outcome: Progressing

## 2023-12-31 DIAGNOSIS — R000 Tachycardia, unspecified: Secondary | ICD-10-CM | POA: Diagnosis not present

## 2023-12-31 DIAGNOSIS — Z87820 Personal history of traumatic brain injury: Secondary | ICD-10-CM | POA: Diagnosis not present

## 2023-12-31 DIAGNOSIS — J690 Pneumonitis due to inhalation of food and vomit: Secondary | ICD-10-CM | POA: Diagnosis not present

## 2023-12-31 DIAGNOSIS — G40909 Epilepsy, unspecified, not intractable, without status epilepticus: Secondary | ICD-10-CM | POA: Diagnosis not present

## 2023-12-31 LAB — GLUCOSE, CAPILLARY
Glucose-Capillary: 112 mg/dL — ABNORMAL HIGH (ref 70–99)
Glucose-Capillary: 113 mg/dL — ABNORMAL HIGH (ref 70–99)
Glucose-Capillary: 119 mg/dL — ABNORMAL HIGH (ref 70–99)
Glucose-Capillary: 125 mg/dL — ABNORMAL HIGH (ref 70–99)

## 2023-12-31 LAB — BASIC METABOLIC PANEL
Anion gap: 10 (ref 5–15)
BUN: 24 mg/dL — ABNORMAL HIGH (ref 6–20)
CO2: 27 mmol/L (ref 22–32)
Calcium: 9.2 mg/dL (ref 8.9–10.3)
Chloride: 105 mmol/L (ref 98–111)
Creatinine, Ser: 0.42 mg/dL — ABNORMAL LOW (ref 0.61–1.24)
GFR, Estimated: >60 mL/min (ref >60)
Glucose, Bld: 113 mg/dL — ABNORMAL HIGH (ref 70–99)
Potassium: 4.2 mmol/L (ref 3.5–5.1)
Sodium: 142 mmol/L (ref 135–145)

## 2023-12-31 LAB — PHOSPHORUS: Phosphorus: 4 mg/dL (ref 2.5–4.6)

## 2023-12-31 LAB — MAGNESIUM: Magnesium: 2.3 mg/dL (ref 1.7–2.4)
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
## 2023-12-31 NOTE — Plan of Care (Signed)
  Problem: Fluid Volume: Goal: Hemodynamic stability will improve Outcome: Progressing   Problem: Clinical Measurements: Goal: Diagnostic test results will improve Outcome: Progressing Goal: Signs and symptoms of infection will decrease Outcome: Progressing   Problem: Clinical Measurements: Goal: Signs and symptoms of infection will decrease Outcome: Progressing   Problem: Respiratory: Goal: Ability to maintain adequate ventilation will improve Outcome: Progressing   Problem: Activity: Goal: Ability to tolerate increased activity will improve Outcome: Progressing   Problem: Role Relationship: Goal: Method of communication will improve Outcome: Progressing   Problem: Respiratory: Goal: Ability to maintain a clear airway and adequate ventilation will improve Outcome: Progressing   Problem: Activity: Goal: Ability to tolerate increased activity will improve Outcome: Progressing   Problem: Respiratory: Goal: Ability to maintain a clear airway and adequate ventilation will improve Outcome: Progressing   Problem: Role Relationship: Goal: Method of communication will improve Outcome: Progressing   Problem: Education: Goal: Knowledge of General Education information will improve Description: Including pain rating scale, medication(s)/side effects and non-pharmacologic comfort measures Outcome: Progressing   Problem: Health Behavior/Discharge Planning: Goal: Ability to manage health-related needs will improve Outcome: Progressing   Problem: Clinical Measurements: Goal: Ability to maintain clinical measurements within normal limits will improve Outcome: Progressing Goal: Will remain free from infection Outcome: Progressing Goal: Diagnostic test results will improve Outcome: Progressing Goal: Respiratory complications will improve Outcome: Progressing Goal: Cardiovascular complication will be avoided Outcome: Progressing   Problem: Activity: Goal: Risk for  activity intolerance will decrease Outcome: Progressing   Problem: Nutrition: Goal: Adequate nutrition will be maintained Outcome: Progressing   Problem: Coping: Goal: Level of anxiety will decrease Outcome: Progressing   Problem: Elimination: Goal: Will not experience complications related to bowel motility Outcome: Progressing Goal: Will not experience complications related to urinary retention Outcome: Progressing   Problem: Pain Managment: Goal: General experience of comfort will improve and/or be controlled Outcome: Progressing   Problem: Safety: Goal: Ability to remain free from injury will improve Outcome: Progressing   Problem: Skin Integrity: Goal: Risk for impaired skin integrity will decrease Outcome: Progressing

## 2023-12-31 NOTE — Progress Notes (Signed)
 Progress Note   Patient: Alex [name] HQI:[id number] DOB: [birth year] DOA: 11/19/2023     42 DOS: the patient was seen and examined on 12/31/2023   Hospital course:  28 yo M presenting to [facility] ED from outpatient rehab on 11/19/23 for evaluation of altered mental status.   History obtained per chart review and mother's telephone interview, patient unable to participate in interview due to respiratory distress and baseline TBI. 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 He was treated at [facility] for 5 months then discharged to rehab. Per his mother and legal guardian his baseline is: Non verbal but he will yell out sporadic nonsensical words with left sided paralysis. He is able to move his RUE in order to feed himself with finger foods and he has some involuntary movement with that arm, he will swat at you. Similar baseline function with RLE, he can kick and move, but often will lay it bent and to the side. He has enough strength to even try and get out of bed on the right side. She denies any issues with swallowing, but eats mostly soft food. If he is not watched closely with eating he will try to eat all the food at once. The facility staff reported he was at his normal baseline until lunchtime on 11/19/23. It was observed he was more somnolent and not interactive. Staff noted a cough and slightly increased work of breathing. Mom also confirmed noting a congested cough the last time she visited earlier in the week. Staff and mom denied nausea/ vomiting, but mom reports chronic loose stools.   EMS reported the patient febrile and tachycardic on arrival.   ED course: Upon arrival patient tachycardic and lethargic. Sepsis protocol initiated with antibiotics and IVF resuscitation. Labs significant for mild hypokalemia, otherwise WNL. Initial imaging unremarkable but then patient became hypoxic with SpO2 85% on RA and a CTa was obtained, negative for PE but concerning for  aspiration pneumonia.   11/20/23: Admit to ICU due to acute hypoxic respiratory failure requiring urgent intubation and mechanical ventilation secondary to suspected aspiration pneumonia 11/21/23- patient moving RUE, mother at bedside we reviewed medical plan. He remains on 77mcg/kg/hr levophed, today plan to rescusitate more aggresively and wean from levophed and potentially extubate post SBP.   He is febrile this am.  He is on zithromax, unasyn 11/22/23- s/p bronch yesterday with aspiration of mucus plugging.  Today resp status improved with liberation protocol in proces. SLP post extubation today. 11/23/23- patient is +for pseudomonas resp cultures.  He is also MRSA pcr +, refined therapy during rounds with pharmacist. He remains on MV 11/24/23- patient is still on vasopressor support weaning down on MV.  Secretions are slightly better.  11/25/23- patient weaned off levophed, failed SBT with tachypnea, tachycardia.  Secretions much improved. 11/26/23- on minimal vent support, unable to perform SBT due to copious secretions from ETT.   11/27/23- on minimal vent support, secretions improved.  Change Doxycycline to Vancomycin.  Plan for SBT as tolerated. 11/28/23-Pt successfully extubated 02/4, currently tolerating HHFNC @35L /35%.  Required low dose precedex overnight to allow suctioning due to excessive secretions. Pt with suspected seizure activity developed tremors in the right upper extremity and became minimally responsive.  Received 1g of iv keppra.   11/29/23-Overnight pt developed sinus tachycardia/svt 140 to 160's improved following 2.5 mg iv metoprolol.  Tolerating RA with no signs of respiratory distress.  Transferring to progressive care unit TRH to pick on  02/7 12/01/23: Pt transferred back to ICU with severe acute hypoxic respiratory failure initially placed on HHFNC but due to significant hypoxia O2 sats in the 60's he required reintubation and underwent emergent bronchoscopy which revealed thick secretions  in the LUL and LLL resulting in mucous plugging. Therapeutic aspiration performed. CXR showed collapse of the LUL secondary to mucus plugging  12/03/23: Pt remains mechanically intubated on minimal vent settings.  ENT consulted for tracheostomy placement due to recurrent respiratory failure due to inability to clear secretions.  Requiring levophed gtt to maintain map 65 or higher  12/04/23: No acute events overnight on minimal settings pending tracheostomy placement  12/05/23: No acute events overnight, on minimal vent support. Initially on low dose Levophed, now weaned off.  IR placed PEG tube. Pending Tracheostomy placement on Friday. 12/06/23: No acute events overnight, on minimal vent support. Awaiting Tracheostomy placement tomorrow. 12/07/23: No acute events overnight.  Tracheostomy placed this morning by ENT, no events with procedure. Will keep sedated today with newly placed Trach with plan for WUA/SBT tomorrow. 12/08/23: All sedation turned off for WUA will perform SBT or TCT as tolerated.  Pt spike at temp 100.9 F, hypotensive requiring levophed gtt, and tachycardic concerning for possible sepsis.  Blood culture/tracheal aspirate and UA sent.  Restarted broad spectrum abx (vancomycin/zosyn) 12/09/23: Pt no longer requiring levophed gtt.  Tmax 102.4 F 12/10/23: Central Line Removed, not requiring pressors or sedation. Tolerating TC. 12/11/23: Overnight was started back on Gabapentin and scheduled Propanol for possible neurostorming with significant improvement in HR from the 170's to mid 100's 12/12/23: HR remains stable in low 100's, not requiring vasopressors, on minimal vent support, SBT as tolerated.  Complete course of ABX as suspect he is colonized with Pseudomonas and MRSA. 12/13/23: Overnight with concern for possible tube feeds in ETT tubing. Propanol was decreased due to soft BP. CXR this morning without new infiltrate, no leukocytosis or fever, and bowel pattern normal on KUB, will restart tube  feeds. Remains on minimal vent support, execise in PSV as tolerated ~ unable to tolerate less than 10/5 in PSV. 12/14/23: No significant events overnight.  Afebrile, hemodynamically stable.  On minimal vent support, continue with SBT/TCT as tolerated. 12/15/23: No change in patient's condition.  Remained on full vent support overnight.  Now on pressure support and tolerating. 12/16/23: Tolerated trach collar trials 12/17/23- patient awaiting placement.  He may have had another seizure and we will order EEG for him today.  Disposition likely difficult due to insurance qualification with trach.   2/25-3/4: TOC working on placement   I assumed care on 12/26/2023. 3/5: oxygen desaturations with pt care tasks per RN this morning.  CXR shows decreased inflation and volume loss of left hemithorax with possible mucus plugging of left mainstem bronchus.    Assessment and Plan:  Acute respiratory failure with hypoxia Patient required intubation on admission, later re-intubated as outlined above in hospital course.  Tracheostomy on 12/07/2023.   Has been tolerating trach collar. 3/5 -- O2 desaturations with movement during pt care tasks per RN. ?Mucus plugging on repeat CXR. Added chest PT. 3/6 -- weaned to room air --Continue chest PT for mucus plugging which has been a recurrent issue --O2 per protocol, maintain sat > 90%  Leukocytosis - RESOLVED - WBC increased to 12.9 on 3/6, after acute O2 desaturations day before that resolved with chest PT.   Possibly reactive but will closely monitor for recurrent infection given his risk of aspiration. 3/7 -- WBC normalized 12.9 >>  5.9 --Monitor fever curve and clinically for s/sx's of infection --Defer resuming antibiotics for now  Aspiration pneumonia Completed antibiotics.   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   Completed course of Zyvox and Zosyn. --Aspiration precautions --NPO --SLP consulted, last seen on 12/01/23 while intubated --On  tube feeds, RD following   Tracheostomy/PEG status --SLP following - initiated PMV trial today, to assess swallow soon --Suctioning PRN --Monitor respiratory status --Tube feeds per dietitian orders  Hypotension --Continue increased dose of midodrine 15 mg 3 times daily.   Tachycardia-improved Related to TBI and "neurostorming" - has recurrent episodes of significant tachycardia --Continue Cardizem and metoprolol --PRN IV metoprolol if HR sustaining > 135 bpm   Septic shock - resolved Completed antibiotics.  Still on midodrine for blood pressure.   --Monitor fever curve and CBC   Acute metabolic encephalopathy Patient is alert, able to move his right arm. EEG showed moderate diffuse encephalopathy but no epileptiform discharges seen   Hypokalemia --Continue repletion and monitoring   Seizure disorder Recent EEG did not show any seizure activity. -- Continue valproic acid, gabapentin.     History of traumatic brain injury Bedbound at baseline, able to move his right arm. --Continue Baclofen, gabapentin, Robinul, Depakote, Seroquel   Reactive thrombocytosis --Monitor CBC   Pressure injury of  - POA.   See full description below, I agree with characterization. --Continue frequent repositioning, offloading pressure areas --Wound care --Monitor closely for signs of infection          Subjective: Pt awake when seen today.  Non-verbal.  Calm for me during exam.  No acute events reported.   Physical Exam: Vitals:   12/31/23 0528 12/31/23 0531 12/31/23 0844 12/31/23 1105  BP: 115/69 115/69 118/74 124/74  Pulse:  98 99 (!) 108  Resp:      Temp:   98.9 F (37.2 C) 98.3 F (36.8 C)  TempSrc:    Axillary  SpO2:  95% 97% 96%  Weight:      Height:       General exam: awake, alert, no acute distress HEENT: moist mucus membranes, hearing grossly normal  Respiratory system: lungs overall clear with intermittent referred upper airway secretion sounds, no wheezes,  normal respiratory effort, trach collar on 5L O2 Cardiovascular system: normal S1/S2, tachycardic, regular rhythm, +pedal edema.   Gastrointestinal system: soft, NT, ND Central nervous system: limited exam, hypertonic contractures of extremities, non-verbal Skin: dry, intact, normal temperature, no rashes seen on visualized skin   Data Reviewed:  Notable labs --  Glucose 113, BUN 24, Cr 0j.42  Last Hbg stable 11.1  CBG's in 100's to 120's  Family Communication: None present  Disposition: Status is: Inpatient Remains inpatient appropriate because: Needs placement - LTAC or return to prior LTC.    3/7 - per TOC, cannot return to LTC at [facility] until end of the month.    Planned Discharge Destination: Skilled nursing facility versus LTAC     Time spent: 35 minutes   Author: Pennie Banter, DO 12/31/2023 1:56 PM  For on call review [email or web address].

## 2024-01-01 DIAGNOSIS — J9601 Acute respiratory failure with hypoxia: Secondary | ICD-10-CM | POA: Diagnosis not present

## 2024-01-01 DIAGNOSIS — Z87820 Personal history of traumatic brain injury: Secondary | ICD-10-CM | POA: Diagnosis not present

## 2024-01-01 DIAGNOSIS — G40909 Epilepsy, unspecified, not intractable, without status epilepticus: Secondary | ICD-10-CM | POA: Diagnosis not present

## 2024-01-01 LAB — GLUCOSE, CAPILLARY
Glucose-Capillary: 85 mg/dL (ref 70–99)
Glucose-Capillary: 89 mg/dL (ref 70–99)
Glucose-Capillary: 110 mg/dL — ABNORMAL HIGH (ref 70–99)
Glucose-Capillary: 132 mg/dL — ABNORMAL HIGH (ref 70–99)

## 2024-01-01 NOTE — Progress Notes (Addendum)
 Progress Note   Patient: Alex [name]:[id number] DOB: [birth year] DOA: 11/19/2023     43 DOS: the patient was seen and examined on 01/01/2024   Hospital course:  28 yo M presenting to [facility] ED from outpatient rehab on 11/19/23 for evaluation of altered mental status.   History obtained per chart review and mother's telephone interview, patient unable to participate in interview due to respiratory distress and baseline TBI. 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 He was treated at [facility] for 5 months then discharged to rehab. Per his mother and legal guardian his baseline is: Non verbal but he will yell out sporadic nonsensical words with left sided paralysis. He is able to move his RUE in order to feed himself with finger foods and he has some involuntary movement with that arm, he will swat at you. Similar baseline function with RLE, he can kick and move, but often will lay it bent and to the side. He has enough strength to even try and get out of bed on the right side. She denies any issues with swallowing, but eats mostly soft food. If he is not watched closely with eating he will try to eat all the food at once. The facility staff reported he was at his normal baseline until lunchtime on 11/19/23. It was observed he was more somnolent and not interactive. Staff noted a cough and slightly increased work of breathing. Mom also confirmed noting a congested cough the last time she visited earlier in the week. Staff and mom denied nausea/ vomiting, but mom reports chronic loose stools.   EMS reported the patient febrile and tachycardic on arrival.   ED course: Upon arrival patient tachycardic and lethargic. Sepsis protocol initiated with antibiotics and IVF resuscitation. Labs significant for mild hypokalemia, otherwise WNL. Initial imaging unremarkable but then patient became hypoxic with SpO2 85% on RA and a CTa was obtained, negative for PE but concerning for  aspiration pneumonia.   11/20/23: Admit to ICU due to acute hypoxic respiratory failure requiring urgent intubation and mechanical ventilation secondary to suspected aspiration pneumonia 11/21/23- patient moving RUE, mother at bedside we reviewed medical plan. He remains on 11mcg/kg/hr levophed, today plan to rescusitate more aggresively and wean from levophed and potentially extubate post SBP.   He is febrile this am.  He is on zithromax, unasyn 11/22/23- s/p bronch yesterday with aspiration of mucus plugging.  Today resp status improved with liberation protocol in proces. SLP post extubation today. 11/23/23- patient is +for pseudomonas resp cultures.  He is also MRSA pcr +, refined therapy during rounds with pharmacist. He remains on MV 11/24/23- patient is still on vasopressor support weaning down on MV.  Secretions are slightly better.  11/25/23- patient weaned off levophed, failed SBT with tachypnea, tachycardia.  Secretions much improved. 11/26/23- on minimal vent support, unable to perform SBT due to copious secretions from ETT.   11/27/23- on minimal vent support, secretions improved.  Change Doxycycline to Vancomycin.  Plan for SBT as tolerated. 11/28/23-Pt successfully extubated 02/4, currently tolerating HHFNC @35L /35%.  Required low dose precedex overnight to allow suctioning due to excessive secretions. Pt with suspected seizure activity developed tremors in the right upper extremity and became minimally responsive.  Received 1g of iv keppra.   11/29/23-Overnight pt developed sinus tachycardia/svt 140 to 160's improved following 2.5 mg iv metoprolol.  Tolerating RA with no signs of respiratory distress.  Transferring to progressive care unit TRH to pick on  02/7 12/01/23: Pt transferred back to ICU with severe acute hypoxic respiratory failure initially placed on HHFNC but due to significant hypoxia O2 sats in the 60's he required reintubation and underwent emergent bronchoscopy which revealed thick secretions  in the LUL and LLL resulting in mucous plugging. Therapeutic aspiration performed. CXR showed collapse of the LUL secondary to mucus plugging  12/03/23: Pt remains mechanically intubated on minimal vent settings.  ENT consulted for tracheostomy placement due to recurrent respiratory failure due to inability to clear secretions.  Requiring levophed gtt to maintain map 65 or higher  12/04/23: No acute events overnight on minimal settings pending tracheostomy placement  12/05/23: No acute events overnight, on minimal vent support. Initially on low dose Levophed, now weaned off.  IR placed PEG tube. Pending Tracheostomy placement on Friday. 12/06/23: No acute events overnight, on minimal vent support. Awaiting Tracheostomy placement tomorrow. 12/07/23: No acute events overnight.  Tracheostomy placed this morning by ENT, no events with procedure. Will keep sedated today with newly placed Trach with plan for WUA/SBT tomorrow. 12/08/23: All sedation turned off for WUA will perform SBT or TCT as tolerated.  Pt spike at temp 100.9 F, hypotensive requiring levophed gtt, and tachycardic concerning for possible sepsis.  Blood culture/tracheal aspirate and UA sent.  Restarted broad spectrum abx (vancomycin/zosyn) 12/09/23: Pt no longer requiring levophed gtt.  Tmax 102.4 F 12/10/23: Central Line Removed, not requiring pressors or sedation. Tolerating TC. 12/11/23: Overnight was started back on Gabapentin and scheduled Propanol for possible neurostorming with significant improvement in HR from the 170's to mid 100's 12/12/23: HR remains stable in low 100's, not requiring vasopressors, on minimal vent support, SBT as tolerated.  Complete course of ABX as suspect he is colonized with Pseudomonas and MRSA. 12/13/23: Overnight with concern for possible tube feeds in ETT tubing. Propanol was decreased due to soft BP. CXR this morning without new infiltrate, no leukocytosis or fever, and bowel pattern normal on KUB, will restart tube  feeds. Remains on minimal vent support, execise in PSV as tolerated ~ unable to tolerate less than 10/5 in PSV. 12/14/23: No significant events overnight.  Afebrile, hemodynamically stable.  On minimal vent support, continue with SBT/TCT as tolerated. 12/15/23: No change in patient's condition.  Remained on full vent support overnight.  Now on pressure support and tolerating. 12/16/23: Tolerated trach collar trials 12/17/23- patient awaiting placement.  He may have had another seizure and we will order EEG for him today.  Disposition likely difficult due to insurance qualification with trach.   2/25-3/4: TOC working on placement   I assumed care on 12/26/2023. 3/5: oxygen desaturations with pt care tasks per RN this morning.  CXR shows decreased inflation and volume loss of left hemithorax with possible mucus plugging of left mainstem bronchus.  3/6--3/11: medically uneventful, stable respiratory status.   Likely back to prior LTC facility but not until end of the month, per TOC.     Assessment and Plan:  Acute respiratory failure with hypoxia Patient required intubation on admission, later re-intubated as outlined above in hospital course.  Tracheostomy on 12/07/2023.   Has been tolerating trach collar. 3/5 -- O2 desaturations with movement during pt care tasks per RN. ?Mucus plugging on repeat CXR. Added chest PT. 3/6 -- weaned to room air --Continue chest PT for mucus plugging which has been a recurrent issue --O2 per protocol, maintain sat > 90%  Leukocytosis - RESOLVED - WBC increased to 12.9 on 3/6, after acute O2 desaturations day before that  resolved with chest PT.   Possibly reactive but will closely monitor for recurrent infection given his risk of aspiration. 3/7 -- WBC normalized 12.9 >> 5.9 --Monitor fever curve and clinically for s/sx's of infection --Defer resuming antibiotics for now  Aspiration pneumonia Completed antibiotics.   Pseudomonas and MRSA pneumonia.  Likely  colonized.   Completed course of Zyvox and Zosyn. --Aspiration precautions --NPO --SLP consulted, last seen on 12/01/23 while intubated --On tube feeds, RD following   Tracheostomy/PEG status --SLP following - initiated PMV trial today, to assess swallow soon --Suctioning PRN --Monitor respiratory status --Tube feeds per dietitian orders  Hypotension --Continue increased dose of midodrine 15 mg 3 times daily.   Tachycardia-improved Related to TBI and "neurostorming" - has recurrent episodes of significant tachycardia --Continue Cardizem and metoprolol --PRN IV metoprolol if HR sustaining > 135 bpm   Septic shock - resolved Completed antibiotics.  Still on midodrine for blood pressure.   --Monitor fever curve and CBC   Acute metabolic encephalopathy Patient is alert, able to move his right arm. EEG showed moderate diffuse encephalopathy but no epileptiform discharges seen   Hypokalemia --Continue repletion and monitoring   Seizure disorder Recent EEG did not show any seizure activity. -- Continue valproic acid, gabapentin.     History of traumatic brain injury Bedbound at baseline, able to move his right arm. --Continue Baclofen, gabapentin, Robinul, Depakote, Seroquel   Reactive thrombocytosis --Monitor CBC   Pressure injury of  - POA.   See full description below, I agree with characterization. --Continue frequent repositioning, offloading pressure areas --Wound care --Monitor closely for signs of infection          Subjective: Pt awake when seen today.  Non-verbal.  No acute events reported.  O2 needs stable at baseline.   Physical Exam: Vitals:   01/01/24 0407 01/01/24 0453 01/01/24 0830 01/01/24 1150  BP: 112/70  117/77 102/75  Pulse: 90  (!) 104 (!) 106  Resp:    18  Temp: 98.1 F (36.7 C)  97.7 F (36.5 C) 98.2 F (36.8 C)  TempSrc: Axillary     SpO2: 100%  97% 99%  Weight:  65.5 kg    Height:       General exam: awake, alert, no acute  distress HEENT: moist mucus membranes, hearing grossly normal  Respiratory system: lungs clear with intermittent referred upper airway secretion sounds, no wheezes, normal respiratory effort, trach collar on 5L O2 Cardiovascular system: normal S1/S2, tachycardic, regular rhythm, +pedal edema.   Gastrointestinal system: soft, NT, ND Central nervous system: limited exam, hypertonic contractures of extremities, non-verbal Skin: dry, intact, normal temperature, no rashes seen on visualized skin   Data Reviewed:  Notable labs --  Glucose 113, BUN 24, Cr 0.42  Last Hbg stable 11.1  CBG's in 100's to 120's  Family Communication: None present  Disposition: Status is: Inpatient Remains inpatient appropriate because: Needs placement - LTAC or return to prior LTC.    3/7 - per TOC, cannot return to LTC at [facility] until end of the month.    Planned Discharge Destination: Skilled nursing facility versus LTAC     Time spent: 35 minutes   Author: Pennie Banter, DO 01/01/2024 2:44 PM  For on call review [email or web address].

## 2024-01-01 NOTE — Plan of Care (Signed)
  Problem: Fluid Volume: Goal: Hemodynamic stability will improve Outcome: Progressing   Problem: Clinical Measurements: Goal: Diagnostic test results will improve Outcome: Progressing Goal: Signs and symptoms of infection will decrease Outcome: Progressing   Problem: Respiratory: Goal: Ability to maintain adequate ventilation will improve Outcome: Progressing   Problem: Activity: Goal: Ability to tolerate increased activity will improve Outcome: Progressing   Problem: Respiratory: Goal: Ability to maintain a clear airway and adequate ventilation will improve Outcome: Progressing   Problem: Role Relationship: Goal: Method of communication will improve Outcome: Progressing   Problem: Education: Goal: Knowledge of General Education information will improve Description: Including pain rating scale, medication(s)/side effects and non-pharmacologic comfort measures Outcome: Progressing   Problem: Health Behavior/Discharge Planning: Goal: Ability to manage health-related needs will improve Outcome: Progressing   Problem: Clinical Measurements: Goal: Ability to maintain clinical measurements within normal limits will improve Outcome: Progressing Goal: Will remain free from infection Outcome: Progressing Goal: Diagnostic test results will improve Outcome: Progressing Goal: Respiratory complications will improve Outcome: Progressing Goal: Cardiovascular complication will be avoided Outcome: Progressing   Problem: Activity: Goal: Risk for activity intolerance will decrease Outcome: Progressing   Problem: Nutrition: Goal: Adequate nutrition will be maintained Outcome: Progressing   Problem: Coping: Goal: Level of anxiety will decrease Outcome: Progressing   Problem: Elimination: Goal: Will not experience complications related to bowel motility Outcome: Progressing Goal: Will not experience complications related to urinary retention Outcome: Progressing   Problem:  Pain Managment: Goal: General experience of comfort will improve and/or be controlled Outcome: Progressing   Problem: Safety: Goal: Ability to remain free from injury will improve Outcome: Progressing   Problem: Skin Integrity: Goal: Risk for impaired skin integrity will decrease Outcome: Progressing   Problem: Activity: Goal: Ability to tolerate increased activity will improve Outcome: Progressing   Problem: Respiratory: Goal: Ability to maintain a clear airway and adequate ventilation will improve Outcome: Progressing   Problem: Role Relationship: Goal: Method of communication will improve Outcome: Progressing
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
## 2024-01-01 NOTE — Plan of Care (Signed)
  Problem: Clinical Measurements: Goal: Diagnostic test results will improve Outcome: Progressing Goal: Signs and symptoms of infection will decrease Outcome: Progressing   Problem: Respiratory: Goal: Ability to maintain adequate ventilation will improve Outcome: Progressing   Problem: Respiratory: Goal: Ability to maintain a clear airway and adequate ventilation will improve Outcome: Progressing   Problem: Respiratory: Goal: Ability to maintain a clear airway and adequate ventilation will improve Outcome: Progressing

## 2024-01-02 DIAGNOSIS — R000 Tachycardia, unspecified: Secondary | ICD-10-CM | POA: Diagnosis not present

## 2024-01-02 DIAGNOSIS — J690 Pneumonitis due to inhalation of food and vomit: Secondary | ICD-10-CM | POA: Diagnosis not present

## 2024-01-02 DIAGNOSIS — J9601 Acute respiratory failure with hypoxia: Secondary | ICD-10-CM | POA: Diagnosis not present

## 2024-01-02 DIAGNOSIS — I959 Hypotension, unspecified: Secondary | ICD-10-CM | POA: Diagnosis not present

## 2024-01-02 LAB — GLUCOSE, CAPILLARY
Glucose-Capillary: 104 mg/dL — ABNORMAL HIGH (ref 70–99)
Glucose-Capillary: 114 mg/dL — ABNORMAL HIGH (ref 70–99)
Glucose-Capillary: 92 mg/dL (ref 70–99)
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
## 2024-01-02 NOTE — Plan of Care (Signed)
  Problem: Fluid Volume: Goal: Hemodynamic stability will improve Outcome: Progressing   Problem: Clinical Measurements: Goal: Signs and symptoms of infection will decrease Outcome: Progressing   Problem: Respiratory: Goal: Ability to maintain adequate ventilation will improve Outcome: Progressing   Problem: Respiratory: Goal: Ability to maintain a clear airway and adequate ventilation will improve Outcome: Progressing

## 2024-01-02 NOTE — Progress Notes (Signed)
 Progress Note   Patient: Alex [name] VWU:[id number] DOB: [birth year] DOA: 11/19/2023     44 DOS: the patient was seen and examined on 01/02/2024   Brief hospital course: 28 yo M presenting to [facility] ED from outpatient rehab on 11/19/23 for evaluation of altered mental status.   History obtained per chart review and mother's telephone interview, patient unable to participate in interview due to respiratory distress and baseline TBI. 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 He was treated at [facility] for 5 months then discharged to rehab. Per his mother and legal guardian his baseline is: Non verbal but he will yell out sporadic nonsensical words with left sided paralysis. He is able to move his RUE in order to feed himself with finger foods and he has some involuntary movement with that arm, he will swat at you. Similar baseline function with RLE, he can kick and move, but often will lay it bent and to the side. He has enough strength to even try and get out of bed on the right side. She denies any issues with swallowing, but eats mostly soft food. If he is not watched closely with eating he will try to eat all the food at once. The facility staff reported he was at his normal baseline until lunchtime on 11/19/23. It was observed he was more somnolent and not interactive. Staff noted a cough and slightly increased work of breathing. Mom also confirmed noting a congested cough the last time she visited earlier in the week. Staff and mom denied nausea/ vomiting, but mom reports chronic loose stools.   EMS reported the patient febrile and tachycardic on arrival.   ED course: Upon arrival patient tachycardic and lethargic. Sepsis protocol initiated with antibiotics and IVF resuscitation. Labs significant for mild hypokalemia, otherwise WNL. Initial imaging unremarkable but then patient became hypoxic with SpO2 85% on RA and a CTa was obtained, negative for PE but concerning for  aspiration pneumonia.  11/20/23: Admit to ICU due to acute hypoxic respiratory failure requiring urgent intubation and mechanical ventilatory support secondary to suspected aspiration and pneumonia 11/21/23- patient moving RUE, mother at bedside we reviewed medical plan. He remains on 41mcg/kg/hr levophed, today plan to rescusitate more aggresively and wean from levophed and potentially extubate post SBP.   He is febrile this am.  He is on zithromax, unasyn 11/22/23- s/p bronch yesterday with aspiration of mucus plugging.  Today resp status improved with liberation protocol in proces. SLP post extubation today. 11/23/23- patient is +for pseudomonas resp cultures.  He is also MRSA pcr +, refined therapy during rounds with pharmacist. He remains on MV 11/24/23- patient is still on vasopressor support weaning down on MV.  Secretions are slightly better.  11/25/23- patient weaned off levophed, failed SBT with tachypnea, tachycardia.  Secretions much improved. 11/26/23- on minimal vent support, unable to perform SBT due to copious secretions from ETT.   11/27/23- on minimal vent support, secretions improved.  Change Doxycycline to Vancomycin.  Plan for SBT as tolerated. 11/28/23-Pt successfully extubated 02/4, currently tolerating HHFNC @35L /35%.  Required low dose precedex overnight to allow suctioning due to excessive secretions. Pt with suspected seizure activity developed tremors in the right upper extremity and became minimally responsive.  Received 1g of iv keppra.   11/29/23-Overnight pt developed sinus tachycardia/svt 140 to 160's improved following 2.5 mg iv metoprolol.  Tolerating RA with no signs of respiratory distress.  Transferring to progressive care unit TRH to pick  on 02/7 12/01/23: Pt transferred back to ICU with severe acute hypoxic respiratory failure initially placed on HHFNC but due to significant hypoxia O2 sats in the 60's he required reintubation and underwent emergent bronchoscopy which revealed thick  secretions in the LUL and LLL resulting in mucous plugging. Therapeutic aspiration performed. CXR showed collapse of the LUL secondary to mucus plugging  12/03/23: Pt remains mechanically intubated on minimal vent settings.  ENT consulted for tracheostomy placement due to recurrent respiratory failure due to inability to clear secretions.  Requiring levophed gtt to maintain map 65 or higher  12/04/23: No acute events overnight on minimal settings pending tracheostomy placement  12/05/23: No acute events overnight, on minimal vent support. Initially on low dose Levophed, now weaned off.  IR placed PEG tube. Pending Tracheostomy placement on Friday. 12/06/23: No acute events overnight, on minimal vent support. Awaiting Tracheostomy placement tomorrow. 12/07/23: No acute events overnight.  Tracheostomy placed this morning by ENT, no events with procedure. Will keep sedated today with newly placed Trach with plan for WUA/SBT tomorrow. 12/08/23: All sedation turned off for WUA will perform SBT or TCT as tolerated.  Pt spike at temp 100.9 F, hypotensive requiring levophed gtt, and tachycardic concerning for possible sepsis.  Blood culture/tracheal aspirate and UA sent.  Restarted broad spectrum abx (vancomycin/zosyn) 12/09/23: Pt no longer requiring levophed gtt.  Tmax 102.4 F 12/10/23: Central Line Removed, not requiring pressors or sedation. Tolearting TC. 12/11/23: Overnight was started back on Gabapentin and scheduled Propanol for possible neurostorming with significant improvement in HR from the 170's to mid 100's 12/12/23: HR remains stable in low 100's, not requiring vasopressors, on minimal vent support, SBT as tolerated.  Complete course of ABX as suspect he is colonized with Pseudomonas and MRSA. 12/13/23: Overnight with concern for possible tube feeds in ETT tubing. Propanol was decreased due to soft BP. CXR this morning without new infiltrate, no leukocytosis or fever, and bowel pattern normal on KUB, will  restart tube feeds. Remains on minimal vent support, execise in PSV as tolerated ~ unable to tolerate less than 10/5 in PSV. 12/14/23: No significant events overnight.  Afebrile, hemodynamically stable.  On minimal vent support, continue with SBT/TCT as tolerated. 12/15/23: No change in patient's condition.  Remained on full vent support overnight.  Now on pressure support and tolerating. 12/16/23: Tolerated trach collar trials 12/17/23- patient awaiting placement.  He may have had another seizure and we will order EEG for him today.  He may not leave due to insurance qualification with trache.  12/18/23- Patient with no acute events overnight. Labs including cbc and bmp reviewed with no significant new findings.  12/19/2023.  Transferred to medical service.  Will transfer out of ICU.  Currently on trach collar 28% around 6 L flow.  Saturating well.  Started on Cardizem to control heart rate 2/27.  Increase midodrine to 15 mg 3 times daily.  Added metoprolol to control heart rate 2/28.  Titrating metoprolol to try to control heart rate. 2/25 to 3/4 TOC trying to work on placement. 3/5 patient had mucous plugging of the left mainstem bronchus 3/6 through 3/12.  Patient to go back to his facility but likely not the end of the month.  Assessment and Plan: * Hypotension Continue midodrine 15 mg 3 times daily.  Acute hypoxic respiratory failure [facility]) Patient intubated during the hospital course.  Tracheostomy on 12/07/2023.  Now on trach collar.  Patient had a mucous plug on 3/5.  Tachycardia Continue Cardizem and metoprolol  and heart rate better controlled.  Septic shock (HCC) Resolved completed antibiotics.  Still on midodrine for blood pressure.  White blood cell count normal range.  Acute metabolic encephalopathy Patient is alert.  Patient able to move his right arm.  Aspiration pneumonia (HCC) Completed antibiotics.  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  Completed course of Zyvox  and Zosyn.  Hypokalemia Replaced  Seizure disorder (HCC) Patient on valproic acid, Vimpat and gabapentin.  Recent EEG did not show any seizure activity.  History of traumatic brain injury Patient is bedbound.  But he is able to move his right arm.  Reactive thrombocytosis Last platelet count in normal range  Pressure injury of skin Present on admission.  See full description below.        Subjective: Patient has his eyes open and able to squeeze my hand with his right hand.  Admitted 43 days ago with altered mental status and pneumonia  Physical Exam: Vitals:   01/02/24 0800 01/02/24 0801 01/02/24 0946 01/02/24 1106  BP:  118/74 125/84 116/78  Pulse:  (!) 101 86 91  Resp:  19  20  Temp:  98.7 F (37.1 C)  97.9 F (36.6 C)  TempSrc:  Axillary  Axillary  SpO2: 100% 100%  99%  Weight:      Height:       Physical Exam HENT:     Head: Normocephalic.  Eyes:     General: Lids are normal.     Conjunctiva/sclera: Conjunctivae normal.  Cardiovascular:     Rate and Rhythm: Normal rate and regular rhythm.     Heart sounds: Normal heart sounds, S1 normal and S2 normal.  Pulmonary:     Breath sounds: No decreased breath sounds, wheezing, rhonchi or rales.  Abdominal:     Palpations: Abdomen is soft.     Tenderness: There is no abdominal tenderness.  Musculoskeletal:     Right lower leg: No swelling.     Left lower leg: No swelling.     Comments: Left arm contracted  Skin:    General: Skin is warm.     Findings: No rash.  Neurological:     Mental Status: He is alert.     Comments: Patient able to squeeze my hand with his right hand.     Data Reviewed: Last creatinine 0.42 last hemoglobin 11.1  Family Communication: Left message for patient's mother  Disposition: Status is: Inpatient Remains inpatient appropriate because: TOC trying to work on disposition  Planned Discharge Destination: Rehab    Time spent: 28 minutes  Author: Alford Highland,  MD 01/02/2024 2:55 PM  For on call review [email or web address].

## 2024-01-02 NOTE — Plan of Care (Signed)
  Problem: Fluid Volume: Goal: Hemodynamic stability will improve Outcome: Progressing   Problem: Clinical Measurements: Goal: Diagnostic test results will improve Outcome: Progressing Goal: Signs and symptoms of infection will decrease Outcome: Progressing   Problem: Respiratory: Goal: Ability to maintain adequate ventilation will improve Outcome: Progressing   Problem: Activity: Goal: Ability to tolerate increased activity will improve Outcome: Progressing   Problem: Respiratory: Goal: Ability to maintain a clear airway and adequate ventilation will improve Outcome: Progressing   Problem: Role Relationship: Goal: Method of communication will improve Outcome: Progressing   Problem: Education: Goal: Knowledge of General Education information will improve Description: Including pain rating scale, medication(s)/side effects and non-pharmacologic comfort measures Outcome: Progressing   Problem: Health Behavior/Discharge Planning: Goal: Ability to manage health-related needs will improve Outcome: Progressing   Problem: Clinical Measurements: Goal: Ability to maintain clinical measurements within normal limits will improve Outcome: Progressing Goal: Will remain free from infection Outcome: Progressing Goal: Diagnostic test results will improve Outcome: Progressing Goal: Respiratory complications will improve Outcome: Progressing Goal: Cardiovascular complication will be avoided Outcome: Progressing   Problem: Activity: Goal: Risk for activity intolerance will decrease Outcome: Progressing   Problem: Nutrition: Goal: Adequate nutrition will be maintained Outcome: Progressing   Problem: Coping: Goal: Level of anxiety will decrease Outcome: Progressing   Problem: Elimination: Goal: Will not experience complications related to bowel motility Outcome: Progressing Goal: Will not experience complications related to urinary retention Outcome: Progressing   Problem:  Pain Managment: Goal: General experience of comfort will improve and/or be controlled Outcome: Progressing   Problem: Safety: Goal: Ability to remain free from injury will improve Outcome: Progressing   Problem: Skin Integrity: Goal: Risk for impaired skin integrity will decrease Outcome: Progressing   Problem: Activity: Goal: Ability to tolerate increased activity will improve Outcome: Progressing   Problem: Respiratory: Goal: Ability to maintain a clear airway and adequate ventilation will improve Outcome: Progressing   Problem: Role Relationship: Goal: Method of communication will improve Outcome: Progressing

## 2024-01-03 DIAGNOSIS — A419 Sepsis, unspecified organism: Secondary | ICD-10-CM | POA: Diagnosis not present

## 2024-01-03 DIAGNOSIS — R000 Tachycardia, unspecified: Secondary | ICD-10-CM | POA: Diagnosis not present

## 2024-01-03 DIAGNOSIS — I959 Hypotension, unspecified: Secondary | ICD-10-CM | POA: Diagnosis not present

## 2024-01-03 DIAGNOSIS — J9601 Acute respiratory failure with hypoxia: Secondary | ICD-10-CM | POA: Diagnosis not present

## 2024-01-03 LAB — GLUCOSE, CAPILLARY
Glucose-Capillary: 111 mg/dL — ABNORMAL HIGH (ref 70–99)
Glucose-Capillary: 132 mg/dL — ABNORMAL HIGH (ref 70–99)
Glucose-Capillary: 86 mg/dL (ref 70–99)
Glucose-Capillary: 112 mg/dL — ABNORMAL HIGH (ref 70–99)
Glucose-Capillary: 107 mg/dL — ABNORMAL HIGH (ref 70–99)

## 2024-01-03 NOTE — Progress Notes (Signed)
 Progress Note   Patient: Alex [name]:[id number] DOB: 08-14-1996 DOA: 11/19/2023     45 DOS: the patient was seen and examined on 01/03/2024   Brief hospital course: 28 yo M presenting to [facility] ED from outpatient rehab on 11/19/23 for evaluation of altered mental status.   History obtained per chart review and mother's telephone interview, patient unable to participate in interview due to respiratory distress and baseline TBI. 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 He was treated at [facility] for 5 months then discharged to rehab. Per his mother and legal guardian his baseline is: Non verbal but he will yell out sporadic nonsensical words with left sided paralysis. He is able to move his RUE in order to feed himself with finger foods and he has some involuntary movement with that arm, he will swat at you. Similar baseline function with RLE, he can kick and move, but often will lay it bent and to the side. He has enough strength to even try and get out of bed on the right side. She denies any issues with swallowing, but eats mostly soft food. If he is not watched closely with eating he will try to eat all the food at once. The facility staff reported he was at his normal baseline until lunchtime on 11/19/23. It was observed he was more somnolent and not interactive. Staff noted a cough and slightly increased work of breathing. Mom also confirmed noting a congested cough the last time she visited earlier in the week. Staff and mom denied nausea/ vomiting, but mom reports chronic loose stools.   EMS reported the patient febrile and tachycardic on arrival.   ED course: Upon arrival patient tachycardic and lethargic. Sepsis protocol initiated with antibiotics and IVF resuscitation. Labs significant for mild hypokalemia, otherwise WNL. Initial imaging unremarkable but then patient became hypoxic with SpO2 85% on RA and a CTa was obtained, negative for PE but concerning for  aspiration pneumonia.  11/20/23: Admit to ICU due to acute hypoxic respiratory failure requiring urgent intubation and mechanical ventilatory support secondary to suspected aspiration and pneumonia 11/21/23- patient moving RUE, mother at bedside we reviewed medical plan. He remains on 60mcg/kg/hr levophed, today plan to rescusitate more aggresively and wean from levophed and potentially extubate post SBP.   He is febrile this am.  He is on zithromax, unasyn 11/22/23- s/p bronch yesterday with aspiration of mucus plugging.  Today resp status improved with liberation protocol in proces. SLP post extubation today. 11/23/23- patient is +for pseudomonas resp cultures.  He is also MRSA pcr +, refined therapy during rounds with pharmacist. He remains on MV 11/24/23- patient is still on vasopressor support weaning down on MV.  Secretions are slightly better.  11/25/23- patient weaned off levophed, failed SBT with tachypnea, tachycardia.  Secretions much improved. 11/26/23- on minimal vent support, unable to perform SBT due to copious secretions from ETT.   11/27/23- on minimal vent support, secretions improved.  Change Doxycycline to Vancomycin.  Plan for SBT as tolerated. 11/28/23-Pt successfully extubated 02/4, currently tolerating HHFNC @35L /35%.  Required low dose precedex overnight to allow suctioning due to excessive secretions. Pt with suspected seizure activity developed tremors in the right upper extremity and became minimally responsive.  Received 1g of iv keppra.   11/29/23-Overnight pt developed sinus tachycardia/svt 140 to 160's improved following 2.5 mg iv metoprolol.  Tolerating RA with no signs of respiratory distress.  Transferring to progressive care unit TRH to pick  on 02/7 12/01/23: Pt transferred back to ICU with severe acute hypoxic respiratory failure initially placed on HHFNC but due to significant hypoxia O2 sats in the 60's he required reintubation and underwent emergent bronchoscopy which revealed thick  secretions in the LUL and LLL resulting in mucous plugging. Therapeutic aspiration performed. CXR showed collapse of the LUL secondary to mucus plugging  12/03/23: Pt remains mechanically intubated on minimal vent settings.  ENT consulted for tracheostomy placement due to recurrent respiratory failure due to inability to clear secretions.  Requiring levophed gtt to maintain map 65 or higher  12/04/23: No acute events overnight on minimal settings pending tracheostomy placement  12/05/23: No acute events overnight, on minimal vent support. Initially on low dose Levophed, now weaned off.  IR placed PEG tube. Pending Tracheostomy placement on Friday. 12/06/23: No acute events overnight, on minimal vent support. Awaiting Tracheostomy placement tomorrow. 12/07/23: No acute events overnight.  Tracheostomy placed this morning by ENT, no events with procedure. Will keep sedated today with newly placed Trach with plan for WUA/SBT tomorrow. 12/08/23: All sedation turned off for WUA will perform SBT or TCT as tolerated.  Pt spike at temp 100.9 F, hypotensive requiring levophed gtt, and tachycardic concerning for possible sepsis.  Blood culture/tracheal aspirate and UA sent.  Restarted broad spectrum abx (vancomycin/zosyn) 12/09/23: Pt no longer requiring levophed gtt.  Tmax 102.4 F 12/10/23: Central Line Removed, not requiring pressors or sedation. Tolearting TC. 12/11/23: Overnight was started back on Gabapentin and scheduled Propanol for possible neurostorming with significant improvement in HR from the 170's to mid 100's 12/12/23: HR remains stable in low 100's, not requiring vasopressors, on minimal vent support, SBT as tolerated.  Complete course of ABX as suspect he is colonized with Pseudomonas and MRSA. 12/13/23: Overnight with concern for possible tube feeds in ETT tubing. Propanol was decreased due to soft BP. CXR this morning without new infiltrate, no leukocytosis or fever, and bowel pattern normal on KUB, will  restart tube feeds. Remains on minimal vent support, execise in PSV as tolerated ~ unable to tolerate less than 10/5 in PSV. 12/14/23: No significant events overnight.  Afebrile, hemodynamically stable.  On minimal vent support, continue with SBT/TCT as tolerated. 12/15/23: No change in patient's condition.  Remained on full vent support overnight.  Now on pressure support and tolerating. 12/16/23: Tolerated trach collar trials 12/17/23- patient awaiting placement.  He may have had another seizure and we will order EEG for him today.  He may not leave due to insurance qualification with trache.  12/18/23- Patient with no acute events overnight. Labs including cbc and bmp reviewed with no significant new findings.  12/19/2023.  Transferred to medical service.  Will transfer out of ICU.  Currently on trach collar 28% around 6 L flow.  Saturating well.  Started on Cardizem to control heart rate 2/27.  Increase midodrine to 15 mg 3 times daily.  Added metoprolol to control heart rate 2/28.  Titrating metoprolol to try to control heart rate. 2/25 to 3/4 TOC trying to work on placement. 3/5 patient had mucous plugging of the left mainstem bronchus 3/6 through 3/12.  Patient to go back to his facility but likely not the end of the month. 3/13.  Speech pathology cleared to go on ice chips and will do a modified swallow evaluation in the future.  Assessment and Plan: * Hypotension Continue midodrine 15 mg 3 times daily.  Acute hypoxic respiratory failure [facility]) Patient intubated during the hospital course.  Tracheostomy on  12/07/2023.  Now on trach collar.  Patient had a mucous plug on 3/5.  Tachycardia Heart rate improved with Cardizem and metoprolol.  Septic shock (HCC) Resolved completed antibiotics.  Still on midodrine for blood pressure.  White blood cell count normal range.  Acute metabolic encephalopathy Patient is alert.  Patient able to move his right arm.  Aspiration pneumonia (HCC) Completed  antibiotics.  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  Completed course of Zyvox and Zosyn.  Hypokalemia Replaced  Seizure disorder (HCC) Patient on valproic acid, Vimpat and gabapentin.  Recent EEG did not show any seizure activity.  History of traumatic brain injury Patient is bedbound.  But he is able to move his right arm.  Reactive thrombocytosis Last platelet count in normal range  Pressure injury of skin Present on admission.  See full description below.        Subjective: Patient is alert and tracks me.  Admitted 44 days ago with altered mental status and sepsis and pneumonia.  Physical Exam: Vitals:   01/03/24 0538 01/03/24 0611 01/03/24 0817 01/03/24 1116  BP:  131/85 (!) 135/93 102/60  Pulse:  (!) 109 95 93  Resp:  20 20 19   Temp:  98.3 F (36.8 C) 98.8 F (37.1 C) 98.6 F (37 C)  TempSrc:  Axillary    SpO2: 100% 100% 100% 100%  Weight:  63.2 kg    Height:       Physical Exam HENT:     Head: Normocephalic.  Eyes:     General: Lids are normal.     Conjunctiva/sclera: Conjunctivae normal.  Cardiovascular:     Rate and Rhythm: Normal rate and regular rhythm.     Heart sounds: Normal heart sounds, S1 normal and S2 normal.  Pulmonary:     Breath sounds: No decreased breath sounds, wheezing, rhonchi or rales.  Abdominal:     Palpations: Abdomen is soft.     Tenderness: There is no abdominal tenderness.  Musculoskeletal:     Right lower leg: No swelling.     Left lower leg: No swelling.     Comments: Left arm contracted  Skin:    General: Skin is warm.     Findings: No rash.  Neurological:     Mental Status: He is alert.     Data Reviewed: Last creatinine 0.14  Family Communication: Updated patient's mother on the phone  Disposition: Status is: Inpatient Remains inpatient appropriate because: TOC working on disposition plans  Planned Discharge Destination: Rehab    Time spent: 28 minutes  Author: Alford Highland,  MD 01/03/2024 1:37 PM  For on call review [email or web address].
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
## 2024-01-04 DIAGNOSIS — A419 Sepsis, unspecified organism: Secondary | ICD-10-CM | POA: Diagnosis not present

## 2024-01-04 DIAGNOSIS — J9601 Acute respiratory failure with hypoxia: Secondary | ICD-10-CM | POA: Diagnosis not present

## 2024-01-04 DIAGNOSIS — I9589 Other hypotension: Secondary | ICD-10-CM | POA: Diagnosis not present

## 2024-01-04 DIAGNOSIS — G9341 Metabolic encephalopathy: Secondary | ICD-10-CM | POA: Diagnosis not present

## 2024-01-04 LAB — GLUCOSE, CAPILLARY
Glucose-Capillary: 126 mg/dL — ABNORMAL HIGH (ref 70–99)
Glucose-Capillary: 90 mg/dL (ref 70–99)
Glucose-Capillary: 106 mg/dL — ABNORMAL HIGH (ref 70–99)
Glucose-Capillary: 106 mg/dL — ABNORMAL HIGH (ref 70–99)
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
## 2024-01-04 NOTE — Plan of Care (Signed)
  Problem: Fluid Volume: Goal: Hemodynamic stability will improve Outcome: Progressing   Problem: Clinical Measurements: Goal: Diagnostic test results will improve Outcome: Progressing Goal: Signs and symptoms of infection will decrease Outcome: Progressing   Problem: Respiratory: Goal: Ability to maintain adequate ventilation will improve Outcome: Progressing   Problem: Activity: Goal: Ability to tolerate increased activity will improve Outcome: Progressing   Problem: Respiratory: Goal: Ability to maintain a clear airway and adequate ventilation will improve Outcome: Progressing   Problem: Role Relationship: Goal: Method of communication will improve Outcome: Progressing   Problem: Education: Goal: Knowledge of General Education information will improve Description: Including pain rating scale, medication(s)/side effects and non-pharmacologic comfort measures Outcome: Progressing   Problem: Health Behavior/Discharge Planning: Goal: Ability to manage health-related needs will improve Outcome: Progressing   Problem: Clinical Measurements: Goal: Ability to maintain clinical measurements within normal limits will improve Outcome: Progressing Goal: Will remain free from infection Outcome: Progressing Goal: Diagnostic test results will improve Outcome: Progressing Goal: Respiratory complications will improve Outcome: Progressing Goal: Cardiovascular complication will be avoided Outcome: Progressing   Problem: Activity: Goal: Risk for activity intolerance will decrease Outcome: Progressing   Problem: Nutrition: Goal: Adequate nutrition will be maintained Outcome: Progressing   Problem: Coping: Goal: Level of anxiety will decrease Outcome: Progressing   Problem: Elimination: Goal: Will not experience complications related to bowel motility Outcome: Progressing Goal: Will not experience complications related to urinary retention Outcome: Progressing   Problem:  Pain Managment: Goal: General experience of comfort will improve and/or be controlled Outcome: Progressing   Problem: Safety: Goal: Ability to remain free from injury will improve Outcome: Progressing   Problem: Skin Integrity: Goal: Risk for impaired skin integrity will decrease Outcome: Progressing   Problem: Activity: Goal: Ability to tolerate increased activity will improve Outcome: Progressing   Problem: Respiratory: Goal: Ability to maintain a clear airway and adequate ventilation will improve Outcome: Progressing   Problem: Role Relationship: Goal: Method of communication will improve Outcome: Progressing

## 2024-01-04 NOTE — Progress Notes (Addendum)
 Pt with soft BP 95/62 (73), HR at  89 NSR, pt has scheduled cardizem 30 mg p.o, per provider B. Jon Billings, hold dose. No other concern at the moment.

## 2024-01-05 DIAGNOSIS — I9589 Other hypotension: Secondary | ICD-10-CM | POA: Diagnosis not present

## 2024-01-05 DIAGNOSIS — A419 Sepsis, unspecified organism: Secondary | ICD-10-CM | POA: Diagnosis not present

## 2024-01-05 DIAGNOSIS — J9601 Acute respiratory failure with hypoxia: Secondary | ICD-10-CM | POA: Diagnosis not present

## 2024-01-05 DIAGNOSIS — R000 Tachycardia, unspecified: Secondary | ICD-10-CM | POA: Diagnosis not present

## 2024-01-05 LAB — CBC
HCT: 33.4 % — ABNORMAL LOW (ref 39.0–52.0)
Hemoglobin: 10.8 g/dL — ABNORMAL LOW (ref 13.0–17.0)
MCH: 30.1 pg (ref 26.0–34.0)
MCHC: 32.3 g/dL (ref 30.0–36.0)
MCV: 93 fL (ref 80.0–100.0)
Platelets: 318 10*3/uL (ref 150–400)
RBC: 3.59 MIL/uL — ABNORMAL LOW (ref 4.22–5.81)
RDW: 12.5 % (ref 11.5–15.5)
WBC: 5 10*3/uL (ref 4.0–10.5)
nRBC: 0 % (ref 0.0–0.2)

## 2024-01-05 LAB — GLUCOSE, CAPILLARY
Glucose-Capillary: 110 mg/dL — ABNORMAL HIGH (ref 70–99)
Glucose-Capillary: 113 mg/dL — ABNORMAL HIGH (ref 70–99)
Glucose-Capillary: 114 mg/dL — ABNORMAL HIGH (ref 70–99)
Glucose-Capillary: 138 mg/dL — ABNORMAL HIGH (ref 70–99)
Glucose-Capillary: 98 mg/dL (ref 70–99)

## 2024-01-05 LAB — BASIC METABOLIC PANEL
Anion gap: 7 (ref 5–15)
BUN: 17 mg/dL (ref 6–20)
CO2: 26 mmol/L (ref 22–32)
Calcium: 8.8 mg/dL — ABNORMAL LOW (ref 8.9–10.3)
Chloride: 105 mmol/L (ref 98–111)
Creatinine, Ser: 0.38 mg/dL — ABNORMAL LOW (ref 0.61–1.24)
GFR, Estimated: >60 mL/min (ref >60)
Glucose, Bld: 86 mg/dL (ref 70–99)
Potassium: 4.2 mmol/L (ref 3.5–5.1)
Sodium: 138 mmol/L (ref 135–145)

## 2024-01-05 MED ORDER — OMEPRAZOLE 2 MG/ML ORAL SUSPENSION
20.0000 mg | Freq: Two times a day (BID) | ORAL | Status: DC
  Administered 2024-01-05 – 2024-03-14 (×136): 20 mg
  Filled 2024-01-05 (×145): qty 10

## 2024-01-05 NOTE — Plan of Care (Signed)
   Problem: Fluid Volume: Goal: Hemodynamic stability will improve Outcome: Progressing   Problem: Clinical Measurements: Goal: Diagnostic test results will improve Outcome: Progressing Goal: Signs and symptoms of infection will decrease Outcome: Progressing   Problem: Respiratory: Goal: Ability to maintain adequate ventilation will improve Outcome: Progressing

## 2024-01-05 NOTE — Progress Notes (Signed)
 Progress Note   Patient: Alex [name] WGN:[id number] DOB: 06/24/1996 DOA: 11/19/2023     47 DOS: the patient was seen and examined on 01/05/2024   Brief hospital course: 28 yo M presenting to [facility] ED from outpatient rehab on 11/19/23 for evaluation of altered mental status.   History obtained per chart review and mother's telephone interview, patient unable to participate in interview due to respiratory distress and baseline TBI. 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 He was treated at [facility] for 5 months then discharged to rehab. Per his mother and legal guardian his baseline is: Non verbal but he will yell out sporadic nonsensical words with left sided paralysis. He is able to move his RUE in order to feed himself with finger foods and he has some involuntary movement with that arm, he will swat at you. Similar baseline function with RLE, he can kick and move, but often will lay it bent and to the side. He has enough strength to even try and get out of bed on the right side. She denies any issues with swallowing, but eats mostly soft food. If he is not watched closely with eating he will try to eat all the food at once. The facility staff reported he was at his normal baseline until lunchtime on 11/19/23. It was observed he was more somnolent and not interactive. Staff noted a cough and slightly increased work of breathing. Mom also confirmed noting a congested cough the last time she visited earlier in the week. Staff and mom denied nausea/ vomiting, but mom reports chronic loose stools.   EMS reported the patient febrile and tachycardic on arrival.   ED course: Upon arrival patient tachycardic and lethargic. Sepsis protocol initiated with antibiotics and IVF resuscitation. Labs significant for mild hypokalemia, otherwise WNL. Initial imaging unremarkable but then patient became hypoxic with SpO2 85% on RA and a CTa was obtained, negative for PE but concerning for  aspiration pneumonia.  11/20/23: Admit to ICU due to acute hypoxic respiratory failure requiring urgent intubation and mechanical ventilatory support secondary to suspected aspiration and pneumonia 11/21/23- patient moving RUE, mother at bedside we reviewed medical plan. He remains on 28mcg/kg/hr levophed, today plan to rescusitate more aggresively and wean from levophed and potentially extubate post SBP.   He is febrile this am.  He is on zithromax, unasyn 11/22/23- s/p bronch yesterday with aspiration of mucus plugging.  Today resp status improved with liberation protocol in proces. SLP post extubation today. 11/23/23- patient is +for pseudomonas resp cultures.  He is also MRSA pcr +, refined therapy during rounds with pharmacist. He remains on MV 11/24/23- patient is still on vasopressor support weaning down on MV.  Secretions are slightly better.  11/25/23- patient weaned off levophed, failed SBT with tachypnea, tachycardia.  Secretions much improved. 11/26/23- on minimal vent support, unable to perform SBT due to copious secretions from ETT.   11/27/23- on minimal vent support, secretions improved.  Change Doxycycline to Vancomycin.  Plan for SBT as tolerated. 11/28/23-Pt successfully extubated 02/4, currently tolerating HHFNC @35L /35%.  Required low dose precedex overnight to allow suctioning due to excessive secretions. Pt with suspected seizure activity developed tremors in the right upper extremity and became minimally responsive.  Received 1g of iv keppra.   11/29/23-Overnight pt developed sinus tachycardia/svt 140 to 160's improved following 2.5 mg iv metoprolol.  Tolerating RA with no signs of respiratory distress.  Transferring to progressive care unit TRH to pick  on 02/7 12/01/23: Pt transferred back to ICU with severe acute hypoxic respiratory failure initially placed on HHFNC but due to significant hypoxia O2 sats in the 60's he required reintubation and underwent emergent bronchoscopy which revealed thick  secretions in the LUL and LLL resulting in mucous plugging. Therapeutic aspiration performed. CXR showed collapse of the LUL secondary to mucus plugging  12/03/23: Pt remains mechanically intubated on minimal vent settings.  ENT consulted for tracheostomy placement due to recurrent respiratory failure due to inability to clear secretions.  Requiring levophed gtt to maintain map 65 or higher  12/04/23: No acute events overnight on minimal settings pending tracheostomy placement  12/05/23: No acute events overnight, on minimal vent support. Initially on low dose Levophed, now weaned off.  IR placed PEG tube. Pending Tracheostomy placement on Friday. 12/06/23: No acute events overnight, on minimal vent support. Awaiting Tracheostomy placement tomorrow. 12/07/23: No acute events overnight.  Tracheostomy placed this morning by ENT, no events with procedure. Will keep sedated today with newly placed Trach with plan for WUA/SBT tomorrow. 12/08/23: All sedation turned off for WUA will perform SBT or TCT as tolerated.  Pt spike at temp 100.9 F, hypotensive requiring levophed gtt, and tachycardic concerning for possible sepsis.  Blood culture/tracheal aspirate and UA sent.  Restarted broad spectrum abx (vancomycin/zosyn) 12/09/23: Pt no longer requiring levophed gtt.  Tmax 102.4 F 12/10/23: Central Line Removed, not requiring pressors or sedation. Tolearting TC. 12/11/23: Overnight was started back on Gabapentin and scheduled Propanol for possible neurostorming with significant improvement in HR from the 170's to mid 100's 12/12/23: HR remains stable in low 100's, not requiring vasopressors, on minimal vent support, SBT as tolerated.  Complete course of ABX as suspect he is colonized with Pseudomonas and MRSA. 12/13/23: Overnight with concern for possible tube feeds in ETT tubing. Propanol was decreased due to soft BP. CXR this morning without new infiltrate, no leukocytosis or fever, and bowel pattern normal on KUB, will  restart tube feeds. Remains on minimal vent support, execise in PSV as tolerated ~ unable to tolerate less than 10/5 in PSV. 12/14/23: No significant events overnight.  Afebrile, hemodynamically stable.  On minimal vent support, continue with SBT/TCT as tolerated. 12/15/23: No change in patient's condition.  Remained on full vent support overnight.  Now on pressure support and tolerating. 12/16/23: Tolerated trach collar trials 12/17/23- patient awaiting placement.  He may have had another seizure and we will order EEG for him today.  He may not leave due to insurance qualification with trache.  12/18/23- Patient with no acute events overnight. Labs including cbc and bmp reviewed with no significant new findings.  12/19/2023.  Transferred to medical service.  Will transfer out of ICU.  Currently on trach collar 28% around 6 L flow.  Saturating well.  Started on Cardizem to control heart rate 2/27.  Increase midodrine to 15 mg 3 times daily.  Added metoprolol to control heart rate 2/28.  Titrating metoprolol to try to control heart rate. 2/25 to 3/4 TOC trying to work on placement. 3/5 patient had mucous plugging of the left mainstem bronchus 3/6 through 3/12.  Patient to go back to his facility but likely not the end of the month. 3/13.  Speech pathology cleared to go on ice chips and will do a modified swallow evaluation in the future.  Assessment and Plan: * Hypotension Continue midodrine 15 mg 3 times daily.  Acute hypoxic respiratory failure [facility]) Patient intubated during the hospital course.  Tracheostomy on  12/07/2023.  Was on trach collar this morning.  Patient had a mucous plug on 3/5.  Tachycardia Improved.  Continue Cardizem and metoprolol.  Septic shock (HCC) Resolved completed antibiotics.  Still on midodrine for blood pressure.  White blood cell count normal range.  Acute metabolic encephalopathy Patient is alert.  Patient able to move his right arm.  Aspiration pneumonia  (HCC) Completed antibiotics.  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  Completed course of Zyvox and Zosyn.  Hypokalemia Replaced  Seizure disorder (HCC) Patient on valproic acid, Vimpat and gabapentin.  Recent EEG did not show any seizure activity.  History of traumatic brain injury Patient is bedbound.  But he is able to move his right arm.  Reactive thrombocytosis Last platelet count in normal range  Pressure injury of skin Present on admission.  See full description below.        Subjective: Patient able to mouth some words to me but I cannot understand what he is trying to say.  Admitted 46 days ago with acute metabolic encephalopathy sepsis and pneumonia.  Physical Exam: Vitals:   01/05/24 0509 01/05/24 0754 01/05/24 0836 01/05/24 1256  BP: 110/67  122/76 116/72  Pulse: 96 98 97 (!) 103  Resp:  18    Temp: 98.5 F (36.9 C)  99.4 F (37.4 C) 98.7 F (37.1 C)  TempSrc: Axillary     SpO2: 100% 100% 100% 100%  Weight:      Height:       Physical Exam HENT:     Head: Normocephalic.  Eyes:     General: Lids are normal.     Conjunctiva/sclera: Conjunctivae normal.  Cardiovascular:     Rate and Rhythm: Normal rate and regular rhythm.     Heart sounds: Normal heart sounds, S1 normal and S2 normal.  Pulmonary:     Breath sounds: No decreased breath sounds, wheezing, rhonchi or rales.  Abdominal:     Palpations: Abdomen is soft.     Tenderness: There is no abdominal tenderness.  Musculoskeletal:     Right lower leg: No swelling.     Left lower leg: No swelling.     Comments: Left arm contracted  Skin:    General: Skin is warm.     Findings: No rash.  Neurological:     Mental Status: He is alert.     Comments: Patient moves his right arm very quickly.     Data Reviewed: Creatinine 0.38, white blood count 5.2, hemoglobin 10.8, platelet count 318  Family Communication: Left message for mother.  Disposition: Status is: Inpatient Remains  inpatient appropriate because: TOC working on disposition plans  Planned Discharge Destination: Rehab    Time spent: 27 minutes  Author: Alford Highland, MD 01/05/2024 1:07 PM  For on call review [email or web address].

## 2024-01-06 DIAGNOSIS — J9601 Acute respiratory failure with hypoxia: Secondary | ICD-10-CM | POA: Diagnosis not present

## 2024-01-06 DIAGNOSIS — R000 Tachycardia, unspecified: Secondary | ICD-10-CM | POA: Diagnosis not present

## 2024-01-06 DIAGNOSIS — I9589 Other hypotension: Secondary | ICD-10-CM | POA: Diagnosis not present

## 2024-01-06 DIAGNOSIS — A419 Sepsis, unspecified organism: Secondary | ICD-10-CM | POA: Diagnosis not present

## 2024-01-06 LAB — GLUCOSE, CAPILLARY
Glucose-Capillary: 105 mg/dL — ABNORMAL HIGH (ref 70–99)
Glucose-Capillary: 106 mg/dL — ABNORMAL HIGH (ref 70–99)
Glucose-Capillary: 108 mg/dL — ABNORMAL HIGH (ref 70–99)
Glucose-Capillary: 95 mg/dL (ref 70–99)

## 2024-01-06 NOTE — Progress Notes (Signed)
 Progress Note   Patient: Alex [name]:[id number] DOB: 16-Jun-1996 DOA: 11/19/2023     48 DOS: the patient was seen and examined on 01/06/2024   Brief hospital course: 28 yo M presenting to [facility] ED from outpatient rehab on 11/19/23 for evaluation of altered mental status.   History obtained per chart review and mother's telephone interview, patient unable to participate in interview due to respiratory distress and baseline TBI. 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 He was treated at [facility] for 5 months then discharged to rehab. Per his mother and legal guardian his baseline is: Non verbal but he will yell out sporadic nonsensical words with left sided paralysis. He is able to move his RUE in order to feed himself with finger foods and he has some involuntary movement with that arm, he will swat at you. Similar baseline function with RLE, he can kick and move, but often will lay it bent and to the side. He has enough strength to even try and get out of bed on the right side. She denies any issues with swallowing, but eats mostly soft food. If he is not watched closely with eating he will try to eat all the food at once. The facility staff reported he was at his normal baseline until lunchtime on 11/19/23. It was observed he was more somnolent and not interactive. Staff noted a cough and slightly increased work of breathing. Mom also confirmed noting a congested cough the last time she visited earlier in the week. Staff and mom denied nausea/ vomiting, but mom reports chronic loose stools.   EMS reported the patient febrile and tachycardic on arrival.   ED course: Upon arrival patient tachycardic and lethargic. Sepsis protocol initiated with antibiotics and IVF resuscitation. Labs significant for mild hypokalemia, otherwise WNL. Initial imaging unremarkable but then patient became hypoxic with SpO2 85% on RA and a CTa was obtained, negative for PE but concerning for  aspiration pneumonia.  11/20/23: Admit to ICU due to acute hypoxic respiratory failure requiring urgent intubation and mechanical ventilatory support secondary to suspected aspiration and pneumonia 11/21/23- patient moving RUE, mother at bedside we reviewed medical plan. He remains on 13mcg/kg/hr levophed, today plan to rescusitate more aggresively and wean from levophed and potentially extubate post SBP.   He is febrile this am.  He is on zithromax, unasyn 11/22/23- s/p bronch yesterday with aspiration of mucus plugging.  Today resp status improved with liberation protocol in proces. SLP post extubation today. 11/23/23- patient is +for pseudomonas resp cultures.  He is also MRSA pcr +, refined therapy during rounds with pharmacist. He remains on MV 11/24/23- patient is still on vasopressor support weaning down on MV.  Secretions are slightly better.  11/25/23- patient weaned off levophed, failed SBT with tachypnea, tachycardia.  Secretions much improved. 11/26/23- on minimal vent support, unable to perform SBT due to copious secretions from ETT.   11/27/23- on minimal vent support, secretions improved.  Change Doxycycline to Vancomycin.  Plan for SBT as tolerated. 11/28/23-Pt successfully extubated 02/4, currently tolerating HHFNC @35L /35%.  Required low dose precedex overnight to allow suctioning due to excessive secretions. Pt with suspected seizure activity developed tremors in the right upper extremity and became minimally responsive.  Received 1g of iv keppra.   11/29/23-Overnight pt developed sinus tachycardia/svt 140 to 160's improved following 2.5 mg iv metoprolol.  Tolerating RA with no signs of respiratory distress.  Transferring to progressive care unit TRH to pick  on 02/7 12/01/23: Pt transferred back to ICU with severe acute hypoxic respiratory failure initially placed on HHFNC but due to significant hypoxia O2 sats in the 60's he required reintubation and underwent emergent bronchoscopy which revealed thick  secretions in the LUL and LLL resulting in mucous plugging. Therapeutic aspiration performed. CXR showed collapse of the LUL secondary to mucus plugging  12/03/23: Pt remains mechanically intubated on minimal vent settings.  ENT consulted for tracheostomy placement due to recurrent respiratory failure due to inability to clear secretions.  Requiring levophed gtt to maintain map 65 or higher  12/04/23: No acute events overnight on minimal settings pending tracheostomy placement  12/05/23: No acute events overnight, on minimal vent support. Initially on low dose Levophed, now weaned off.  IR placed PEG tube. Pending Tracheostomy placement on Friday. 12/06/23: No acute events overnight, on minimal vent support. Awaiting Tracheostomy placement tomorrow. 12/07/23: No acute events overnight.  Tracheostomy placed this morning by ENT, no events with procedure. Will keep sedated today with newly placed Trach with plan for WUA/SBT tomorrow. 12/08/23: All sedation turned off for WUA will perform SBT or TCT as tolerated.  Pt spike at temp 100.9 F, hypotensive requiring levophed gtt, and tachycardic concerning for possible sepsis.  Blood culture/tracheal aspirate and UA sent.  Restarted broad spectrum abx (vancomycin/zosyn) 12/09/23: Pt no longer requiring levophed gtt.  Tmax 102.4 F 12/10/23: Central Line Removed, not requiring pressors or sedation. Tolearting TC. 12/11/23: Overnight was started back on Gabapentin and scheduled Propanol for possible neurostorming with significant improvement in HR from the 170's to mid 100's 12/12/23: HR remains stable in low 100's, not requiring vasopressors, on minimal vent support, SBT as tolerated.  Complete course of ABX as suspect he is colonized with Pseudomonas and MRSA. 12/13/23: Overnight with concern for possible tube feeds in ETT tubing. Propanol was decreased due to soft BP. CXR this morning without new infiltrate, no leukocytosis or fever, and bowel pattern normal on KUB, will  restart tube feeds. Remains on minimal vent support, execise in PSV as tolerated ~ unable to tolerate less than 10/5 in PSV. 12/14/23: No significant events overnight.  Afebrile, hemodynamically stable.  On minimal vent support, continue with SBT/TCT as tolerated. 12/15/23: No change in patient's condition.  Remained on full vent support overnight.  Now on pressure support and tolerating. 12/16/23: Tolerated trach collar trials 12/17/23- patient awaiting placement.  He may have had another seizure and we will order EEG for him today.  He may not leave due to insurance qualification with trache.  12/18/23- Patient with no acute events overnight. Labs including cbc and bmp reviewed with no significant new findings.  12/19/2023.  Transferred to medical service.  Will transfer out of ICU.  Currently on trach collar 28% around 6 L flow.  Saturating well.  Started on Cardizem to control heart rate 2/27.  Increase midodrine to 15 mg 3 times daily.  Added metoprolol to control heart rate 2/28.  Titrating metoprolol to try to control heart rate. 2/25 to 3/4 TOC trying to work on placement. 3/5 patient had mucous plugging of the left mainstem bronchus 3/6 through 3/12.  Patient to go back to his facility but likely not the end of the month. 3/13.  Speech pathology cleared to go on ice chips and will do a modified swallow evaluation in the future.  Assessment and Plan: * Hypotension Continue midodrine 15 mg 3 times daily.  Acute hypoxic respiratory failure [facility]) Patient intubated during the hospital course.  Tracheostomy on  12/07/2023.  Was on trach collar this morning for humidity not hypoxia.  Patient had a mucous plug on 3/5.  Tachycardia Improved.  Continue Cardizem and metoprolol.  Septic shock (HCC) Resolved completed antibiotics.  Still on midodrine for blood pressure.  White blood cell count normal range.  Acute metabolic encephalopathy Patient is alert.  Patient able to move his right  arm.  Aspiration pneumonia (HCC) Completed antibiotics.  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  Completed course of Zyvox and Zosyn.  Hypokalemia Replaced  Seizure disorder (HCC) Patient on valproic acid, Vimpat and gabapentin.  Recent EEG did not show any seizure activity.  History of traumatic brain injury Patient is bedbound.  But he is able to move his right arm.  Reactive thrombocytosis Last platelet count in normal range  Pressure injury of skin Present on admission.  See full description below.        Subjective: Patient alert.  Admitted 47 days ago with altered mental status sepsis and pneumonia.  Physical Exam: Vitals:   01/06/24 0500 01/06/24 0522 01/06/24 0751 01/06/24 0806  BP:  (!) 93/55 95/73   Pulse:  100 97 (!) 104  Resp:  18 18 18   Temp:  97.9 F (36.6 C) (!) 97.5 F (36.4 C)   TempSrc:   Axillary   SpO2:  99% 97% 96%  Weight: 65 kg     Height:       Physical Exam HENT:     Head: Normocephalic.  Eyes:     General: Lids are normal.     Conjunctiva/sclera: Conjunctivae normal.  Cardiovascular:     Rate and Rhythm: Normal rate and regular rhythm.     Heart sounds: Normal heart sounds, S1 normal and S2 normal.  Pulmonary:     Breath sounds: No decreased breath sounds, wheezing, rhonchi or rales.  Abdominal:     Palpations: Abdomen is soft.     Tenderness: There is no abdominal tenderness.  Musculoskeletal:     Right lower leg: No swelling.     Left lower leg: No swelling.     Comments: Left arm contracted  Skin:    General: Skin is warm.     Findings: No rash.  Neurological:     Mental Status: He is alert.     Comments: Patient moving his right arm a lot when I was in the room.     Data Reviewed: Last creatinine 0.38 last hemoglobin 10.8, last white blood cell count 5.0  Family Communication: Left message for patient's mother  Disposition: Status is: Inpatient Remains inpatient appropriate because: TOC looking into  rehabs  Planned Discharge Destination: Rehab    Time spent: 27 minutes  Author: Alford Highland, MD 01/06/2024 11:06 AM  For on call review [email or web address].

## 2024-01-06 NOTE — Progress Notes (Signed)
 SLP Cancellation Note  Patient Details Name: Alex [name] MRN: [id number] DOB: 1995-11-13   Cancelled treatment:       Reason Eval/Treat Not Completed: Fatigue/lethargy limiting ability to participate (Pt roused briefly, but fell back asleep. Not appropriate for POs at this time. Noted pt with soft BP this AM. Will continue efforts as appropriate.)  Clyde Canterbury, M.S., CCC-SLP Speech-Language Pathologist [facility] [phone] Arnette Felts)  Woodroe Chen 01/06/2024, 9:01 AM

## 2024-01-06 NOTE — Progress Notes (Addendum)
 Pt's has soft BP this morning 93/55 (66), HR 100. Has scheduled cardizem this morning, asked provider if dose need to be held. Order to hold dose per [facility] NP. No other concern at the moment. Plan of care continued.

## 2024-01-06 NOTE — Plan of Care (Signed)
  Problem: Fluid Volume: Goal: Hemodynamic stability will improve Outcome: Progressing   Problem: Clinical Measurements: Goal: Diagnostic test results will improve Outcome: Progressing Goal: Signs and symptoms of infection will decrease Outcome: Progressing   Problem: Respiratory: Goal: Ability to maintain adequate ventilation will improve Outcome: Progressing   Problem: Activity: Goal: Ability to tolerate increased activity will improve Outcome: Progressing

## 2024-01-07 DIAGNOSIS — R000 Tachycardia, unspecified: Secondary | ICD-10-CM | POA: Diagnosis not present

## 2024-01-07 DIAGNOSIS — J9601 Acute respiratory failure with hypoxia: Secondary | ICD-10-CM | POA: Diagnosis not present

## 2024-01-07 DIAGNOSIS — I9589 Other hypotension: Secondary | ICD-10-CM | POA: Diagnosis not present

## 2024-01-07 DIAGNOSIS — A419 Sepsis, unspecified organism: Secondary | ICD-10-CM | POA: Diagnosis not present

## 2024-01-07 LAB — GLUCOSE, CAPILLARY
Glucose-Capillary: 108 mg/dL — ABNORMAL HIGH (ref 70–99)
Glucose-Capillary: 122 mg/dL — ABNORMAL HIGH (ref 70–99)
Glucose-Capillary: 122 mg/dL — ABNORMAL HIGH (ref 70–99)
Glucose-Capillary: 127 mg/dL — ABNORMAL HIGH (ref 70–99)

## 2024-01-07 NOTE — Plan of Care (Signed)
  Problem: Fluid Volume: Goal: Hemodynamic stability will improve Outcome: Progressing   Problem: Clinical Measurements: Goal: Diagnostic test results will improve Outcome: Progressing Goal: Signs and symptoms of infection will decrease Outcome: Progressing   Problem: Respiratory: Goal: Ability to maintain adequate ventilation will improve Outcome: Progressing   Problem: Activity: Goal: Ability to tolerate increased activity will improve Outcome: Progressing   Problem: Respiratory: Goal: Ability to maintain a clear airway and adequate ventilation will improve Outcome: Progressing   Problem: Activity: Goal: Ability to tolerate increased activity will improve Outcome: Progressing   Problem: Activity: Goal: Ability to tolerate increased activity will improve Outcome: Progressing   Problem: Respiratory: Goal: Ability to maintain a clear airway and adequate ventilation will improve Outcome: Progressing   Problem: Education: Goal: Knowledge of General Education information will improve Description: Including pain rating scale, medication(s)/side effects and non-pharmacologic comfort measures Outcome: Progressing   Problem: Activity: Goal: Risk for activity intolerance will decrease Outcome: Progressing   Problem: Nutrition: Goal: Adequate nutrition will be maintained Outcome: Progressing   Problem: Safety: Goal: Ability to remain free from injury will improve Outcome: Progressing   Problem: Skin Integrity: Goal: Risk for impaired skin integrity will decrease Outcome: Progressing

## 2024-01-07 NOTE — Progress Notes (Signed)
 Patient on trach collar in no distress, resting well. BBS diminished and clear. Small amount of secretions suctioned from trach. Trach care performed. Patient tolerated interventions well.
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
## 2024-01-07 NOTE — Plan of Care (Signed)
  Problem: Fluid Volume: Goal: Hemodynamic stability will improve Outcome: Progressing   Problem: Respiratory: Goal: Ability to maintain adequate ventilation will improve Outcome: Progressing   Problem: Respiratory: Goal: Ability to maintain a clear airway and adequate ventilation will improve Outcome: Progressing   Problem: Clinical Measurements: Goal: Respiratory complications will improve Outcome: Progressing

## 2024-01-08 DIAGNOSIS — J690 Pneumonitis due to inhalation of food and vomit: Secondary | ICD-10-CM | POA: Diagnosis not present

## 2024-01-08 DIAGNOSIS — J9601 Acute respiratory failure with hypoxia: Secondary | ICD-10-CM | POA: Diagnosis not present

## 2024-01-08 DIAGNOSIS — A419 Sepsis, unspecified organism: Secondary | ICD-10-CM | POA: Diagnosis not present

## 2024-01-08 DIAGNOSIS — D649 Anemia, unspecified: Secondary | ICD-10-CM

## 2024-01-08 DIAGNOSIS — I9589 Other hypotension: Secondary | ICD-10-CM | POA: Diagnosis not present

## 2024-01-08 LAB — GLUCOSE, CAPILLARY
Glucose-Capillary: 92 mg/dL (ref 70–99)
Glucose-Capillary: 109 mg/dL — ABNORMAL HIGH (ref 70–99)
Glucose-Capillary: 112 mg/dL — ABNORMAL HIGH (ref 70–99)
Glucose-Capillary: 122 mg/dL — ABNORMAL HIGH (ref 70–99)

## 2024-01-08 NOTE — Plan of Care (Signed)
  Problem: Fluid Volume: Goal: Hemodynamic stability will improve Outcome: Progressing   Problem: Clinical Measurements: Goal: Diagnostic test results will improve Outcome: Progressing Goal: Signs and symptoms of infection will decrease Outcome: Progressing   Problem: Respiratory: Goal: Ability to maintain adequate ventilation will improve Outcome: Progressing   Problem: Activity: Goal: Ability to tolerate increased activity will improve Outcome: Not Progressing   Problem: Respiratory: Goal: Ability to maintain a clear airway and adequate ventilation will improve Outcome: Progressing   Problem: Role Relationship: Goal: Method of communication will improve Outcome: Not Progressing   Problem: Activity: Goal: Ability to tolerate increased activity will improve Outcome: Not Progressing   Problem: Respiratory: Goal: Ability to maintain a clear airway and adequate ventilation will improve Outcome: Progressing   Problem: Role Relationship: Goal: Method of communication will improve Outcome: Progressing   Problem: Education: Goal: Knowledge of General Education information will improve Description: Including pain rating scale, medication(s)/side effects and non-pharmacologic comfort measures Outcome: Progressing   Problem: Health Behavior/Discharge Planning: Goal: Ability to manage health-related needs will improve Outcome: Not Progressing   Problem: Clinical Measurements: Goal: Ability to maintain clinical measurements within normal limits will improve Outcome: Progressing Goal: Will remain free from infection Outcome: Progressing Goal: Diagnostic test results will improve Outcome: Progressing Goal: Respiratory complications will improve Outcome: Progressing Goal: Cardiovascular complication will be avoided Outcome: Progressing   Problem: Nutrition: Goal: Adequate nutrition will be maintained Outcome: Progressing   Problem: Coping: Goal: Level of anxiety will  decrease Outcome: Progressing   Problem: Elimination: Goal: Will not experience complications related to bowel motility Outcome: Progressing Goal: Will not experience complications related to urinary retention Outcome: Progressing   Problem: Pain Managment: Goal: General experience of comfort will improve and/or be controlled Outcome: Progressing   Problem: Safety: Goal: Ability to remain free from injury will improve Outcome: Progressing   Problem: Skin Integrity: Goal: Risk for impaired skin integrity will decrease Outcome: Progressing
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
## 2024-01-08 NOTE — Assessment & Plan Note (Addendum)
 Resolved.  Last hemoglobin 13.1

## 2024-01-09 DIAGNOSIS — I959 Hypotension, unspecified: Secondary | ICD-10-CM | POA: Diagnosis not present

## 2024-01-09 LAB — GLUCOSE, CAPILLARY
Glucose-Capillary: 102 mg/dL — ABNORMAL HIGH (ref 70–99)
Glucose-Capillary: 116 mg/dL — ABNORMAL HIGH (ref 70–99)
Glucose-Capillary: 122 mg/dL — ABNORMAL HIGH (ref 70–99)
Glucose-Capillary: 128 mg/dL — ABNORMAL HIGH (ref 70–99)
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
## 2024-01-09 NOTE — Plan of Care (Signed)
  Problem: Fluid Volume: Goal: Hemodynamic stability will improve Outcome: Progressing   Problem: Clinical Measurements: Goal: Diagnostic test results will improve Outcome: Progressing Goal: Signs and symptoms of infection will decrease Outcome: Progressing   Problem: Respiratory: Goal: Ability to maintain adequate ventilation will improve Outcome: Progressing   Problem: Activity: Goal: Ability to tolerate increased activity will improve Outcome: Not Progressing   Problem: Respiratory: Goal: Ability to maintain a clear airway and adequate ventilation will improve Outcome: Progressing   Problem: Role Relationship: Goal: Method of communication will improve Outcome: Not Progressing   Problem: Activity: Goal: Ability to tolerate increased activity will improve Outcome: Not Progressing   Problem: Respiratory: Goal: Ability to maintain a clear airway and adequate ventilation will improve Outcome: Progressing   Problem: Role Relationship: Goal: Method of communication will improve Outcome: Progressing   Problem: Education: Goal: Knowledge of General Education information will improve Description: Including pain rating scale, medication(s)/side effects and non-pharmacologic comfort measures Outcome: Progressing   Problem: Health Behavior/Discharge Planning: Goal: Ability to manage health-related needs will improve Outcome: Not Progressing   Problem: Clinical Measurements: Goal: Ability to maintain clinical measurements within normal limits will improve Outcome: Progressing Goal: Will remain free from infection Outcome: Progressing Goal: Diagnostic test results will improve Outcome: Progressing Goal: Respiratory complications will improve Outcome: Progressing Goal: Cardiovascular complication will be avoided Outcome: Progressing   Problem: Activity: Goal: Risk for activity intolerance will decrease Outcome: Not Progressing   Problem: Nutrition: Goal: Adequate  nutrition will be maintained Outcome: Progressing   Problem: Coping: Goal: Level of anxiety will decrease Outcome: Progressing   Problem: Elimination: Goal: Will not experience complications related to bowel motility Outcome: Progressing Goal: Will not experience complications related to urinary retention Outcome: Progressing

## 2024-01-10 ENCOUNTER — Inpatient Hospital Stay

## 2024-01-10 DIAGNOSIS — I959 Hypotension, unspecified: Secondary | ICD-10-CM | POA: Diagnosis not present

## 2024-01-10 LAB — GLUCOSE, CAPILLARY
Glucose-Capillary: 90 mg/dL (ref 70–99)
Glucose-Capillary: 97 mg/dL (ref 70–99)
Glucose-Capillary: 103 mg/dL — ABNORMAL HIGH (ref 70–99)

## 2024-01-10 NOTE — Progress Notes (Signed)
 PROGRESS NOTE    Alex [name]  LKG:[id number] DOB: 1996-02-18 DOA: 11/19/2023 PCP: [facility]   Assessment & Plan:   Principal Problem:   Hypotension Active Problems:   Acute hypoxic respiratory failure (HCC)   Tachycardia   Septic shock (HCC)   Aspiration pneumonia (HCC)   Acute metabolic encephalopathy   Hypokalemia   Seizure disorder (HCC)   Pressure injury of skin   Reactive thrombocytosis   History of traumatic brain injury   Anemia, unspecified  Assessment and Plan: Hypotension: continue on midodrine    Acute hypoxic respiratory failure: s/p intubation, ventilation. S/p trach on 12/07/23. Continue w/ trach collar    Tachycardia: continue on cardizem, metoprolol    Septic shock: completed abx course. Resolved    Acute metabolic encephalopathy: likely secondary to hx of TBI. Continue w/ supportive care    Aspiration pneumonia: likely secondary to pseudomonas & MRSA. Completed abxs. NPO except for ice chips as per speech. MBSS was cancel today & will try again next week as per speech   Hypokalemia: WNL today   Seizure disorder: continue on home dose of valproic acid, vimpat, gabapentin. EEG did not show any seizure activity    Normocytic anemia: no need for a transfusion currently    Hx of traumatic brain injury: bedbound and able to move right arm. Continue w/ supportive care.    Reactive thrombocytosis: resolved    Pressure injury of skin: present on admission. Continue w/ supportive care      DVT prophylaxis: lovenox  Code Status: full  Family Communication: called pt's mother, but no answer so I left a voicemail Disposition Plan: possibly back to home facility   Level of care: Med-Surg  Status is: Inpatient Remains inpatient appropriate because: medically stable. Waiting to go back to home facility     Consultants:    Procedures:   Antimicrobials:    Subjective: Pt is resting comfortable   Objective: Vitals:    01/09/24 2214 01/10/24 0500 01/10/24 0545 01/10/24 0820  BP: 103/81  (!) 108/90 103/70  Pulse: 81  90 (!) 109  Resp: 18  20 16   Temp: 98.4 F (36.9 C)  98 F (36.7 C) 98 F (36.7 C)  TempSrc: Oral     SpO2: 99%   100%  Weight:  65.7 kg    Height:        Intake/Output Summary (Last 24 hours) at 01/10/2024 0825 Last data filed at 01/10/2024 0601 Gross per 24 hour  Intake 1190 ml  Output 1650 ml  Net -460 ml   Filed Weights   01/06/24 0500 01/07/24 0500 01/10/24 0500  Weight: 65 kg 65 kg 65.7 kg    Examination:  General exam: Appears comfortable  Respiratory system: diminished breath sounds b/l Cardiovascular system: S1/S2+. No rubs or gallops Gastrointestinal system: abd is soft, NT, ND & hypoactive bowel sounds  Central nervous system: awake. Unable to follow simple commands Psychiatry: judgement and insight appears poor     Data Reviewed: I have personally reviewed following labs and imaging studies  CBC: Recent Labs  Lab 01/05/24 0412  WBC 5.0  HGB 10.8*  HCT 33.4*  MCV 93.0  PLT 318   Basic Metabolic Panel: Recent Labs  Lab 01/05/24 0412  NA 138  K 4.2  CL 105  CO2 26  GLUCOSE 86  BUN 17  CREATININE 0.38*  CALCIUM 8.8*   GFR: Estimated Creatinine Clearance: 127.8 mL/min (A) (by C-G formula based on SCr of 0.38 mg/dL (  L)). Liver Function Tests: No results for input(s): "AST", "ALT", "ALKPHOS", "BILITOT", "PROT", "ALBUMIN" in the last 168 hours. No results for input(s): "LIPASE", "AMYLASE" in the last 168 hours. No results for input(s): "AMMONIA" in the last 168 hours. Coagulation Profile: No results for input(s): "INR", "PROTIME" in the last 168 hours. Cardiac Enzymes: No results for input(s): "CKTOTAL", "CKMB", "CKMBINDEX", "TROPONINI" in the last 168 hours. BNP (last 3 results) No results for input(s): "PROBNP" in the last 8760 hours. HbA1C: No results for input(s): "HGBA1C" in the last 72 hours. CBG: Recent Labs  Lab 01/09/24 0625  01/09/24 0805 01/09/24 1202 01/09/24 2354 01/10/24 0546  GLUCAP 128* 122* 116* 102* 90   Lipid Profile: No results for input(s): "CHOL", "HDL", "LDLCALC", "TRIG", "CHOLHDL", "LDLDIRECT" in the last 72 hours. Thyroid Function Tests: No results for input(s): "TSH", "T4TOTAL", "FREET4", "T3FREE", "THYROIDAB" in the last 72 hours. Anemia Panel: No results for input(s): "VITAMINB12", "FOLATE", "FERRITIN", "TIBC", "IRON", "RETICCTPCT" in the last 72 hours. Sepsis Labs: No results for input(s): "PROCALCITON", "LATICACIDVEN" in the last 168 hours.  No results found for this or any previous visit (from the past 240 hours).       Radiology Studies: No results found.      Scheduled Meds:  baclofen  10 mg Per Tube TID   diltiazem  30 mg Per Tube Q6H   enoxaparin (LOVENOX) injection  40 mg Subcutaneous Q24H   feeding supplement (PROSource TF20)  60 mL Per Tube Daily   fiber supplement (BANATROL TF)  60 mL Per Tube BID   free water  30 mL Per Tube Q4H   gabapentin  300 mg Per Tube Q8H   lacosamide  100 mg Per Tube BID   metoprolol tartrate  25 mg Per Tube BID   midodrine  15 mg Per Tube TID WC   nutrition supplement (JUVEN)  1 packet Per Tube BID BM   omeprazole  20 mg Per Tube BID   oxyCODONE  5 mg Per Tube Q6H   QUEtiapine  50 mg Per Tube TID   sodium chloride flush  10-40 mL Intracatheter Q12H   valproic acid  500 mg Per Tube Daily   valproic acid  750 mg Per Tube QHS   Continuous Infusions:  feeding supplement (OSMOLITE 1.5 CAL) 1,000 mL (01/10/24 0142)     LOS: 52 days      Charise Killian, MD [facility] Pager [phone]  If 7PM-7AM, please contact night-coverage [email or web address] 01/10/2024, 8:25 AM

## 2024-01-10 NOTE — Progress Notes (Signed)
 SLP Cancellation Note  Patient Details Name: Alex [name] MRN: [id number] DOB: [birth year]   Cancelled treatment:       Reason Eval/Treat Not Completed: Other (comment) (physical agitation, leading to concern for staff safety for transfer/completion of assessment.)  MBSS planned for 0800 this date; however, pt demonstrating physical agitation and aggression toward staff, concerning for increased agitation following transfer and during assessment. To avoid increased pt agitation and for staff safety, assessment canceled.   SLP spoke with pt's mother with updates. Alex [name] reports goal for pt to return to eating to aid Alex [name]'s quality of life and return to his baseline (pt eating finger foods prior to admission). Education shared regarding risk for aspiration and rationale for current NPO status. Alex [name] reported understanding.   Plan to re-attempt MBSS at a later date (potentially with mother present to aid pt comfort), to facilitate assessment of pharyngeal function.   Recommend continued NPO with allowance of ice chips following oral care and with PMV in place. MD and RN aware of recommendations and plan. SLP will continue to follow.  [place] Azarius Lambson Clapp, MS, CCC-SLP Speech Language Pathologist Rehab Services; [facility] [phone] (ascom)    [place] J Clapp 01/10/2024, 9:12 AM

## 2024-01-10 NOTE — Plan of Care (Signed)
  Problem: Fluid Volume: Goal: Hemodynamic stability will improve Outcome: Progressing   Problem: Clinical Measurements: Goal: Diagnostic test results will improve Outcome: Progressing Goal: Signs and symptoms of infection will decrease Outcome: Progressing   Problem: Respiratory: Goal: Ability to maintain adequate ventilation will improve Outcome: Progressing   Problem: Activity: Goal: Ability to tolerate increased activity will improve Outcome: Not Progressing   Problem: Respiratory: Goal: Ability to maintain a clear airway and adequate ventilation will improve Outcome: Progressing   Problem: Role Relationship: Goal: Method of communication will improve Outcome: Progressing   Problem: Activity: Goal: Ability to tolerate increased activity will improve Outcome: Not Progressing   Problem: Respiratory: Goal: Ability to maintain a clear airway and adequate ventilation will improve Outcome: Progressing   Problem: Role Relationship: Goal: Method of communication will improve Outcome: Not Progressing   Problem: Health Behavior/Discharge Planning: Goal: Ability to manage health-related needs will improve Outcome: Not Progressing   Problem: Clinical Measurements: Goal: Ability to maintain clinical measurements within normal limits will improve Outcome: Progressing Goal: Will remain free from infection Outcome: Progressing Goal: Diagnostic test results will improve Outcome: Progressing Goal: Respiratory complications will improve Outcome: Progressing Goal: Cardiovascular complication will be avoided Outcome: Progressing   Problem: Activity: Goal: Risk for activity intolerance will decrease Outcome: Not Progressing   Problem: Nutrition: Goal: Adequate nutrition will be maintained Outcome: Progressing   Problem: Coping: Goal: Level of anxiety will decrease Outcome: Progressing   Problem: Elimination: Goal: Will not experience complications related to bowel  motility Outcome: Progressing Goal: Will not experience complications related to urinary retention Outcome: Progressing   Problem: Safety: Goal: Ability to remain free from injury will improve Outcome: Progressing   Problem: Skin Integrity: Goal: Risk for impaired skin integrity will decrease Outcome: Progressing

## 2024-01-11 DIAGNOSIS — I959 Hypotension, unspecified: Secondary | ICD-10-CM | POA: Diagnosis not present

## 2024-01-11 LAB — GLUCOSE, CAPILLARY
Glucose-Capillary: 108 mg/dL — ABNORMAL HIGH (ref 70–99)
Glucose-Capillary: 106 mg/dL — ABNORMAL HIGH (ref 70–99)
Glucose-Capillary: 114 mg/dL — ABNORMAL HIGH (ref 70–99)
Glucose-Capillary: 85 mg/dL (ref 70–99)

## 2024-01-11 LAB — BASIC METABOLIC PANEL
Anion gap: 13 (ref 5–15)
BUN: 24 mg/dL — ABNORMAL HIGH (ref 6–20)
CO2: 27 mmol/L (ref 22–32)
Calcium: 9.5 mg/dL (ref 8.9–10.3)
Chloride: 102 mmol/L (ref 98–111)
Creatinine, Ser: 0.46 mg/dL — ABNORMAL LOW (ref 0.61–1.24)
GFR, Estimated: >60 mL/min (ref >60)
Glucose, Bld: 91 mg/dL (ref 70–99)
Potassium: 4.1 mmol/L (ref 3.5–5.1)
Sodium: 142 mmol/L (ref 135–145)

## 2024-01-11 LAB — CBC
HCT: 37.6 % — ABNORMAL LOW (ref 39.0–52.0)
Hemoglobin: 12.1 g/dL — ABNORMAL LOW (ref 13.0–17.0)
MCH: 30.1 pg (ref 26.0–34.0)
MCHC: 32.2 g/dL (ref 30.0–36.0)
MCV: 93.5 fL (ref 80.0–100.0)
Platelets: 245 10*3/uL (ref 150–400)
RBC: 4.02 MIL/uL — ABNORMAL LOW (ref 4.22–5.81)
RDW: 12.3 % (ref 11.5–15.5)
WBC: 4.5 10*3/uL (ref 4.0–10.5)
nRBC: 0 % (ref 0.0–0.2)

## 2024-01-11 NOTE — Plan of Care (Signed)
  Problem: Fluid Volume: Goal: Hemodynamic stability will improve Outcome: Progressing   Problem: Clinical Measurements: Goal: Diagnostic test results will improve Outcome: Progressing Goal: Signs and symptoms of infection will decrease Outcome: Progressing   Problem: Respiratory: Goal: Ability to maintain adequate ventilation will improve Outcome: Progressing   Problem: Activity: Goal: Ability to tolerate increased activity will improve Outcome: Progressing   Problem: Respiratory: Goal: Ability to maintain a clear airway and adequate ventilation will improve Outcome: Progressing   Problem: Role Relationship: Goal: Method of communication will improve Outcome: Progressing   Problem: Activity: Goal: Ability to tolerate increased activity will improve Outcome: Progressing   Problem: Respiratory: Goal: Ability to maintain a clear airway and adequate ventilation will improve Outcome: Progressing   Problem: Role Relationship: Goal: Method of communication will improve Outcome: Progressing   Problem: Education: Goal: Knowledge of General Education information will improve Description: Including pain rating scale, medication(s)/side effects and non-pharmacologic comfort measures Outcome: Progressing   Problem: Health Behavior/Discharge Planning: Goal: Ability to manage health-related needs will improve Outcome: Progressing   Problem: Clinical Measurements: Goal: Ability to maintain clinical measurements within normal limits will improve Outcome: Progressing Goal: Will remain free from infection Outcome: Progressing Goal: Diagnostic test results will improve Outcome: Progressing Goal: Respiratory complications will improve Outcome: Progressing Goal: Cardiovascular complication will be avoided Outcome: Progressing   Problem: Clinical Measurements: Goal: Will remain free from infection Outcome: Progressing   Problem: Clinical Measurements: Goal: Diagnostic test  results will improve Outcome: Progressing   Problem: Clinical Measurements: Goal: Respiratory complications will improve Outcome: Progressing   Problem: Activity: Goal: Risk for activity intolerance will decrease Outcome: Progressing   Problem: Nutrition: Goal: Adequate nutrition will be maintained Outcome: Progressing   Problem: Coping: Goal: Level of anxiety will decrease Outcome: Progressing   Problem: Elimination: Goal: Will not experience complications related to bowel motility Outcome: Progressing Goal: Will not experience complications related to urinary retention Outcome: Progressing   Problem: Pain Managment: Goal: General experience of comfort will improve and/or be controlled Outcome: Progressing   Problem: Safety: Goal: Ability to remain free from injury will improve Outcome: Progressing   Problem: Skin Integrity: Goal: Risk for impaired skin integrity will decrease Outcome: Progressing

## 2024-01-11 NOTE — Progress Notes (Signed)
 PROGRESS NOTE    Alex [name]  FAO:[id number] DOB: Sep 12, 1996 DOA: 11/19/2023 PCP: [facility]   Assessment & Plan:   Principal Problem:   Hypotension Active Problems:   Acute hypoxic respiratory failure (HCC)   Tachycardia   Septic shock (HCC)   Aspiration pneumonia (HCC)   Acute metabolic encephalopathy   Hypokalemia   Seizure disorder (HCC)   Pressure injury of skin   Reactive thrombocytosis   History of traumatic brain injury   Anemia, unspecified  Assessment and Plan: Hypotension: continue on midodrine    Acute hypoxic respiratory failure: s/p intubation, ventilation. S/p trach on 12/07/23. Continue w/ trach collar    Tachycardia: continue on metoprolol, cardizem    Septic shock: completed abx course. Resolved    Acute metabolic encephalopathy: likely secondary to hx of TBI. Continue w/ supportive care    Aspiration pneumonia: likely secondary to pseudomonas & MRSA. Completed abxs. NPO except for ice chips as per speech. MBSS was canceled & will try again next week as per speech   Hypokalemia: resolved   Seizure disorder: continue on vimpat, valproic acid & gabapentin. EEG did not show any seizure activity    Normocytic anemia: H&H are stable. No need for a transfusion currently    Hx of traumatic brain injury: bedbound and able to move right arm. Continue w/ supportive care    Reactive thrombocytosis: resolved    Pressure injury of skin: present on admission. Continue w/ supportive care     DVT prophylaxis: lovenox  Code Status: full  Family Communication:  Disposition Plan: possibly back to home facility   Level of care: Med-Surg  Status is: Inpatient Remains inpatient appropriate because: medically stable. Waiting to go back to home facility     Consultants:    Procedures:   Antimicrobials:    Subjective: Pt is shaking his head yes to him having pain   Objective: Vitals:   01/10/24 1611 01/11/24 0507 01/11/24 0745  01/11/24 0808  BP: 111/70 119/81 109/67   Pulse: 96 88 61   Resp: 16 16 16    Temp: 98.4 F (36.9 C) 98.2 F (36.8 C)    TempSrc: Oral Oral    SpO2: 99% 99% 98% 98%  Weight:      Height:        Intake/Output Summary (Last 24 hours) at 01/11/2024 0830 Last data filed at 01/11/2024 0500 Gross per 24 hour  Intake 550 ml  Output 1100 ml  Net -550 ml   Filed Weights   01/06/24 0500 01/07/24 0500 01/10/24 0500  Weight: 65 kg 65 kg 65.7 kg    Examination:  General exam: Appears uncomfortable  Respiratory system: decreased breath sounds b/l  Cardiovascular system: S1 & S2+. No rubs or clicks  Gastrointestinal system: abd is soft, NT, ND & hypoactive bowel sounds Central nervous system: awake. Unable to follow simple commands Psychiatry: judgement and insight appears poor.      Data Reviewed: I have personally reviewed following labs and imaging studies  CBC: Recent Labs  Lab 01/05/24 0412 01/11/24 0258  WBC 5.0 4.5  HGB 10.8* 12.1*  HCT 33.4* 37.6*  MCV 93.0 93.5  PLT 318 245   Basic Metabolic Panel: Recent Labs  Lab 01/05/24 0412 01/11/24 0258  NA 138 142  K 4.2 4.1  CL 105 102  CO2 26 27  GLUCOSE 86 91  BUN 17 24*  CREATININE 0.38* 0.46*  CALCIUM 8.8* 9.5   GFR: Estimated Creatinine Clearance: 127.8 mL/min (A) (  by C-G formula based on SCr of 0.46 mg/dL (L)). Liver Function Tests: No results for input(s): "AST", "ALT", "ALKPHOS", "BILITOT", "PROT", "ALBUMIN" in the last 168 hours. No results for input(s): "LIPASE", "AMYLASE" in the last 168 hours. No results for input(s): "AMMONIA" in the last 168 hours. Coagulation Profile: No results for input(s): "INR", "PROTIME" in the last 168 hours. Cardiac Enzymes: No results for input(s): "CKTOTAL", "CKMB", "CKMBINDEX", "TROPONINI" in the last 168 hours. BNP (last 3 results) No results for input(s): "PROBNP" in the last 8760 hours. HbA1C: No results for input(s): "HGBA1C" in the last 72 hours. CBG: Recent  Labs  Lab 01/10/24 0546 01/10/24 1158 01/10/24 1900 01/11/24 0142 01/11/24 0512  GLUCAP 90 97 103* 108* 106*   Lipid Profile: No results for input(s): "CHOL", "HDL", "LDLCALC", "TRIG", "CHOLHDL", "LDLDIRECT" in the last 72 hours. Thyroid Function Tests: No results for input(s): "TSH", "T4TOTAL", "FREET4", "T3FREE", "THYROIDAB" in the last 72 hours. Anemia Panel: No results for input(s): "VITAMINB12", "FOLATE", "FERRITIN", "TIBC", "IRON", "RETICCTPCT" in the last 72 hours. Sepsis Labs: No results for input(s): "PROCALCITON", "LATICACIDVEN" in the last 168 hours.  No results found for this or any previous visit (from the past 240 hours).       Radiology Studies: No results found.      Scheduled Meds:  baclofen  10 mg Per Tube TID   diltiazem  30 mg Per Tube Q6H   enoxaparin (LOVENOX) injection  40 mg Subcutaneous Q24H   feeding supplement (PROSource TF20)  60 mL Per Tube Daily   fiber supplement (BANATROL TF)  60 mL Per Tube BID   free water  30 mL Per Tube Q4H   gabapentin  300 mg Per Tube Q8H   lacosamide  100 mg Per Tube BID   metoprolol tartrate  25 mg Per Tube BID   midodrine  15 mg Per Tube TID WC   nutrition supplement (JUVEN)  1 packet Per Tube BID BM   omeprazole  20 mg Per Tube BID   oxyCODONE  5 mg Per Tube Q6H   QUEtiapine  50 mg Per Tube TID   sodium chloride flush  10-40 mL Intracatheter Q12H   valproic acid  500 mg Per Tube Daily   valproic acid  750 mg Per Tube QHS   Continuous Infusions:  feeding supplement (OSMOLITE 1.5 CAL) 1,000 mL (01/10/24 2143)     LOS: 53 days      Charise Killian, MD [facility] Pager [phone]  If 7PM-7AM, please contact night-coverage [email or web address] 01/11/2024, 8:30 AM

## 2024-01-11 NOTE — Plan of Care (Signed)
 Problem: Fluid Volume: Goal: Hemodynamic stability will improve Outcome: Progressing   Problem: Clinical Measurements: Goal: Diagnostic test results will improve Outcome: Progressing Goal: Signs and symptoms of infection will decrease Outcome: Progressing   Problem: Respiratory: Goal: Ability to maintain adequate ventilation will improve Outcome: Progressing   Problem: Activity: Goal: Ability to tolerate increased activity will improve Outcome: Not Progressing   Problem: Respiratory: Goal: Ability to maintain a clear airway and adequate ventilation will improve Outcome: Progressing   Problem: Role Relationship: Goal: Method of communication will improve Outcome: Not Progressing   Problem: Activity: Goal: Ability to tolerate increased activity will improve Outcome: Not Progressing   Problem: Respiratory: Goal: Ability to maintain a clear airway and adequate ventilation will improve Outcome: Progressing   Problem: Role Relationship: Goal: Method of communication will improve Outcome: Progressing   Problem: Education: Goal: Knowledge of General Education information will improve Description: Including pain rating scale, medication(s)/side effects and non-pharmacologic comfort measures Outcome: Progressing   Problem: Health Behavior/Discharge Planning: Goal: Ability to manage health-related needs will improve Outcome: Not Progressing   Problem: Clinical Measurements: Goal: Ability to maintain clinical measurements within normal limits will improve Outcome: Progressing Goal: Will remain free from infection Outcome: Progressing Goal: Diagnostic test results will improve Outcome: Progressing Goal: Respiratory complications will improve Outcome: Progressing Goal: Cardiovascular complication will be avoided Outcome: Progressing   Problem: Nutrition: Goal: Adequate nutrition will be maintained Outcome: Progressing   Problem: Coping: Goal: Level of anxiety will  decrease Outcome: Progressing   Problem: Elimination: Goal: Will not experience complications related to bowel motility Outcome: Progressing Goal: Will not experience complications related to urinary retention Outcome: Progressing   Problem: Pain Managment: Goal: General experience of comfort will improve and/or be controlled Outcome: Progressing   Problem: Safety: Goal: Ability to remain free from injury will improve Outcome: Progressing   Problem: Skin Integrity: Goal: Risk for impaired skin integrity will decrease Outcome: Progressing

## 2024-01-12 DIAGNOSIS — I959 Hypotension, unspecified: Secondary | ICD-10-CM | POA: Diagnosis not present

## 2024-01-12 LAB — GLUCOSE, CAPILLARY
Glucose-Capillary: 139 mg/dL — ABNORMAL HIGH (ref 70–99)
Glucose-Capillary: 73 mg/dL (ref 70–99)
Glucose-Capillary: 75 mg/dL (ref 70–99)

## 2024-01-12 NOTE — Progress Notes (Signed)
 PROGRESS NOTE    Alex [name]  ZOX:[id number] DOB: 1995/12/05 DOA: 11/19/2023 PCP: [facility]   Assessment & Plan:   Principal Problem:   Hypotension Active Problems:   Acute hypoxic respiratory failure (HCC)   Tachycardia   Septic shock (HCC)   Aspiration pneumonia (HCC)   Acute metabolic encephalopathy   Hypokalemia   Seizure disorder (HCC)   Pressure injury of skin   Reactive thrombocytosis   History of traumatic brain injury   Anemia, unspecified  Assessment and Plan: Hypotension: continue on midodrine    Acute hypoxic respiratory failure: s/p intubation, ventilation. S/p trach on 12/07/23. Continue w/ trach collar    Tachycardia: continue on metoprolol, cardizem   Septic shock: completed abx course. Resolved    Acute metabolic encephalopathy: likely secondary to hx of TBI. Continue w/ supportive care    Aspiration pneumonia: likely secondary to pseudomonas & MRSA. Completed abxs. NPO except for ice chips as per speech. MBSS was canceled & will try again next week as per speech   Hypokalemia: resolved   Seizure disorder: continue on valproic acid, vimpat & gabapentin. EEG did not show any seizure activity    Normocytic anemia: H&H are stable. No need for a transfusion currently    Hx of traumatic brain injury: bedbound and able to move right arm. Continue w/ supportive care  Reactive thrombocytosis: resolved    Pressure injury of skin: present on admission. Continue w/ supportive care      DVT prophylaxis: lovenox  Code Status: full  Family Communication: discussed pt's care w/ pt's mother, Cala Bradford, and answered her questions  Disposition Plan: possibly back to home facility. Pt's mother wants to know if the pt can be moved to another facility closer to home   Level of care: Med-Surg  Status is: Inpatient Remains inpatient appropriate because: medically stable. Waiting to go back to home facility     Consultants:    Procedures:    Antimicrobials:    Subjective: Pt appears comfortable   Objective: Vitals:   01/11/24 2330 01/12/24 0151 01/12/24 0417 01/12/24 0640  BP: 127/82 (!) 126/53 120/78   Pulse: 92 64 93   Resp: 18 14 18    Temp: 97.8 F (36.6 C) 98.7 F (37.1 C) 98 F (36.7 C)   TempSrc: Axillary Oral    SpO2: 99% 97% 99%   Weight:    66.3 kg  Height:        Intake/Output Summary (Last 24 hours) at 01/12/2024 0819 Last data filed at 01/12/2024 0600 Gross per 24 hour  Intake --  Output 676 ml  Net -676 ml   Filed Weights   01/07/24 0500 01/10/24 0500 01/12/24 0640  Weight: 65 kg 65.7 kg 66.3 kg    Examination:  General exam: Appears calm & comfortable   Respiratory system: diminished breath sounds b/l  Cardiovascular system: S1/S2+. No rubs or clicks  Gastrointestinal system: abd is soft, NT, ND & hypoactive bowel sounds Central nervous system: awake. Unable to follow simple commands Psychiatry: judgement and insight appears poor    Data Reviewed: I have personally reviewed following labs and imaging studies  CBC: Recent Labs  Lab 01/11/24 0258  WBC 4.5  HGB 12.1*  HCT 37.6*  MCV 93.5  PLT 245   Basic Metabolic Panel: Recent Labs  Lab 01/11/24 0258  NA 142  K 4.1  CL 102  CO2 27  GLUCOSE 91  BUN 24*  CREATININE 0.46*  CALCIUM 9.5   GFR: Estimated  Creatinine Clearance: 128.9 mL/min (A) (by C-G formula based on SCr of 0.46 mg/dL (L)). Liver Function Tests: No results for input(s): "AST", "ALT", "ALKPHOS", "BILITOT", "PROT", "ALBUMIN" in the last 168 hours. No results for input(s): "LIPASE", "AMYLASE" in the last 168 hours. No results for input(s): "AMMONIA" in the last 168 hours. Coagulation Profile: No results for input(s): "INR", "PROTIME" in the last 168 hours. Cardiac Enzymes: No results for input(s): "CKTOTAL", "CKMB", "CKMBINDEX", "TROPONINI" in the last 168 hours. BNP (last 3 results) No results for input(s): "PROBNP" in the last 8760 hours. HbA1C: No  results for input(s): "HGBA1C" in the last 72 hours. CBG: Recent Labs  Lab 01/11/24 0142 01/11/24 0512 01/11/24 1204 01/11/24 2159 01/12/24 0357  GLUCAP 108* 106* 114* 85 75   Lipid Profile: No results for input(s): "CHOL", "HDL", "LDLCALC", "TRIG", "CHOLHDL", "LDLDIRECT" in the last 72 hours. Thyroid Function Tests: No results for input(s): "TSH", "T4TOTAL", "FREET4", "T3FREE", "THYROIDAB" in the last 72 hours. Anemia Panel: No results for input(s): "VITAMINB12", "FOLATE", "FERRITIN", "TIBC", "IRON", "RETICCTPCT" in the last 72 hours. Sepsis Labs: No results for input(s): "PROCALCITON", "LATICACIDVEN" in the last 168 hours.  No results found for this or any previous visit (from the past 240 hours).       Radiology Studies: No results found.      Scheduled Meds:  baclofen  10 mg Per Tube TID   diltiazem  30 mg Per Tube Q6H   enoxaparin (LOVENOX) injection  40 mg Subcutaneous Q24H   feeding supplement (PROSource TF20)  60 mL Per Tube Daily   fiber supplement (BANATROL TF)  60 mL Per Tube BID   free water  30 mL Per Tube Q4H   gabapentin  300 mg Per Tube Q8H   lacosamide  100 mg Per Tube BID   metoprolol tartrate  25 mg Per Tube BID   midodrine  15 mg Per Tube TID WC   nutrition supplement (JUVEN)  1 packet Per Tube BID BM   omeprazole  20 mg Per Tube BID   oxyCODONE  5 mg Per Tube Q6H   QUEtiapine  50 mg Per Tube TID   sodium chloride flush  10-40 mL Intracatheter Q12H   valproic acid  500 mg Per Tube Daily   valproic acid  750 mg Per Tube QHS   Continuous Infusions:  feeding supplement (OSMOLITE 1.5 CAL) 1,000 mL (01/10/24 2143)     LOS: 54 days      Charise Killian, MD [facility] Pager [phone]  If 7PM-7AM, please contact night-coverage [email or web address] 01/12/2024, 8:19 AM

## 2024-01-13 DIAGNOSIS — I959 Hypotension, unspecified: Secondary | ICD-10-CM | POA: Diagnosis not present

## 2024-01-13 LAB — GLUCOSE, CAPILLARY
Glucose-Capillary: 110 mg/dL — ABNORMAL HIGH (ref 70–99)
Glucose-Capillary: 110 mg/dL — ABNORMAL HIGH (ref 70–99)
Glucose-Capillary: 102 mg/dL — ABNORMAL HIGH (ref 70–99)

## 2024-01-13 NOTE — Plan of Care (Signed)
  Problem: Fluid Volume: Goal: Hemodynamic stability will improve Outcome: Progressing   Problem: Clinical Measurements: Goal: Diagnostic test results will improve Outcome: Progressing Goal: Signs and symptoms of infection will decrease Outcome: Progressing   Problem: Respiratory: Goal: Ability to maintain adequate ventilation will improve Outcome: Progressing   Problem: Activity: Goal: Ability to tolerate increased activity will improve Outcome: Progressing   Problem: Respiratory: Goal: Ability to maintain a clear airway and adequate ventilation will improve Outcome: Progressing   Problem: Role Relationship: Goal: Method of communication will improve Outcome: Progressing   Problem: Education: Goal: Knowledge of General Education information will improve Description: Including pain rating scale, medication(s)/side effects and non-pharmacologic comfort measures Outcome: Progressing   Problem: Health Behavior/Discharge Planning: Goal: Ability to manage health-related needs will improve Outcome: Progressing   Problem: Clinical Measurements: Goal: Ability to maintain clinical measurements within normal limits will improve Outcome: Progressing Goal: Will remain free from infection Outcome: Progressing Goal: Diagnostic test results will improve Outcome: Progressing Goal: Respiratory complications will improve Outcome: Progressing Goal: Cardiovascular complication will be avoided Outcome: Progressing   Problem: Activity: Goal: Risk for activity intolerance will decrease Outcome: Progressing   Problem: Nutrition: Goal: Adequate nutrition will be maintained Outcome: Progressing   Problem: Coping: Goal: Level of anxiety will decrease Outcome: Progressing   Problem: Elimination: Goal: Will not experience complications related to bowel motility Outcome: Progressing Goal: Will not experience complications related to urinary retention Outcome: Progressing   Problem:  Pain Managment: Goal: General experience of comfort will improve and/or be controlled Outcome: Progressing   Problem: Safety: Goal: Ability to remain free from injury will improve Outcome: Progressing   Problem: Skin Integrity: Goal: Risk for impaired skin integrity will decrease Outcome: Progressing   Problem: Activity: Goal: Ability to tolerate increased activity will improve Outcome: Progressing   Problem: Respiratory: Goal: Ability to maintain a clear airway and adequate ventilation will improve Outcome: Progressing   Problem: Role Relationship: Goal: Method of communication will improve Outcome: Progressing

## 2024-01-13 NOTE — Progress Notes (Signed)
 PROGRESS NOTE    Alex [name]  ZOX:[id number] DOB: [birth year] DOA: 11/19/2023 PCP: [facility]   Assessment & Plan:   Principal Problem:   Hypotension Active Problems:   Acute hypoxic respiratory failure (HCC)   Tachycardia   Septic shock (HCC)   Aspiration pneumonia (HCC)   Acute metabolic encephalopathy   Hypokalemia   Seizure disorder (HCC)   Pressure injury of skin   Reactive thrombocytosis   History of traumatic brain injury   Anemia, unspecified  Assessment and Plan: Hypotension: continue on midodrine    Acute hypoxic respiratory failure: s/p intubation, ventilation. S/p trach on 12/07/23. Continue w/ trach collar    Tachycardia: continue on cardizem, metoprolol    Septic shock: completed abx course. Resolved    Acute metabolic encephalopathy: likely secondary to hx of TBI. Continue w/ supportive care    Aspiration pneumonia: likely secondary to pseudomonas & MRSA. Completed abxs. NPO except for ice chips as per speech. MBSS was canceled & will try again next week as per speech   Hypokalemia: resolved   Seizure disorder: continue on gabapentin, vimpat, & valproic acid. EEG did not show any seizure activity    Normocytic anemia: H&H are labile. No need for a transfusion currently    Hx of traumatic brain injury: bedbound and able to move right arm. Continue w/ supportive care  Reactive thrombocytosis: resolved    Pressure injury of skin: present on admission. Continue w/ supportive care      DVT prophylaxis: lovenox  Code Status: full  Family Communication:  Disposition Plan: possibly back to home facility. Pt's mother wants to know if the pt can be moved to another facility closer to home   Level of care: Med-Surg  Status is: Inpatient Remains inpatient appropriate because: medically stable. Waiting to go back to home facility     Consultants:    Procedures:   Antimicrobials:    Subjective: Pt is resting comfortably    Objective: Vitals:   01/12/24 2315 01/13/24 0359 01/13/24 0500 01/13/24 0805  BP:  99/70  (!) 98/54  Pulse:  76  96  Resp:  16  17  Temp:  98.7 F (37.1 C)  97.8 F (36.6 C)  TempSrc:      SpO2: 98%   100%  Weight:   66.1 kg   Height:        Intake/Output Summary (Last 24 hours) at 01/13/2024 0835 Last data filed at 01/12/2024 1830 Gross per 24 hour  Intake --  Output 300 ml  Net -300 ml   Filed Weights   01/10/24 0500 01/12/24 0640 01/13/24 0500  Weight: 65.7 kg 66.3 kg 66.1 kg    Examination:  General exam: Appears comfortable    Respiratory system: decreased breath sounds b/l  Cardiovascular system: S1 & S2+. No rubs or gallops Gastrointestinal system: abd is soft, NT, ND & hypoactive bowel sounds Central nervous system: awake. Unable to follow simple commands Psychiatry: judgement and insight appears poor     Data Reviewed: I have personally reviewed following labs and imaging studies  CBC: Recent Labs  Lab 01/11/24 0258  WBC 4.5  HGB 12.1*  HCT 37.6*  MCV 93.5  PLT 245   Basic Metabolic Panel: Recent Labs  Lab 01/11/24 0258  NA 142  K 4.1  CL 102  CO2 27  GLUCOSE 91  BUN 24*  CREATININE 0.46*  CALCIUM 9.5   GFR: Estimated Creatinine Clearance: 128.5 mL/min (A) (by C-G formula based on SCr  of 0.46 mg/dL (L)). Liver Function Tests: No results for input(s): "AST", "ALT", "ALKPHOS", "BILITOT", "PROT", "ALBUMIN" in the last 168 hours. No results for input(s): "LIPASE", "AMYLASE" in the last 168 hours. No results for input(s): "AMMONIA" in the last 168 hours. Coagulation Profile: No results for input(s): "INR", "PROTIME" in the last 168 hours. Cardiac Enzymes: No results for input(s): "CKTOTAL", "CKMB", "CKMBINDEX", "TROPONINI" in the last 168 hours. BNP (last 3 results) No results for input(s): "PROBNP" in the last 8760 hours. HbA1C: No results for input(s): "HGBA1C" in the last 72 hours. CBG: Recent Labs  Lab 01/11/24 2159  01/12/24 0357 01/12/24 1142 01/12/24 2349 01/13/24 0803  GLUCAP 85 75 73 139* 110*   Lipid Profile: No results for input(s): "CHOL", "HDL", "LDLCALC", "TRIG", "CHOLHDL", "LDLDIRECT" in the last 72 hours. Thyroid Function Tests: No results for input(s): "TSH", "T4TOTAL", "FREET4", "T3FREE", "THYROIDAB" in the last 72 hours. Anemia Panel: No results for input(s): "VITAMINB12", "FOLATE", "FERRITIN", "TIBC", "IRON", "RETICCTPCT" in the last 72 hours. Sepsis Labs: No results for input(s): "PROCALCITON", "LATICACIDVEN" in the last 168 hours.  No results found for this or any previous visit (from the past 240 hours).       Radiology Studies: No results found.      Scheduled Meds:  baclofen  10 mg Per Tube TID   diltiazem  30 mg Per Tube Q6H   enoxaparin (LOVENOX) injection  40 mg Subcutaneous Q24H   feeding supplement (PROSource TF20)  60 mL Per Tube Daily   fiber supplement (BANATROL TF)  60 mL Per Tube BID   free water  30 mL Per Tube Q4H   gabapentin  300 mg Per Tube Q8H   lacosamide  100 mg Per Tube BID   metoprolol tartrate  25 mg Per Tube BID   midodrine  15 mg Per Tube TID WC   nutrition supplement (JUVEN)  1 packet Per Tube BID BM   omeprazole  20 mg Per Tube BID   oxyCODONE  5 mg Per Tube Q6H   QUEtiapine  50 mg Per Tube TID   sodium chloride flush  10-40 mL Intracatheter Q12H   valproic acid  500 mg Per Tube Daily   valproic acid  750 mg Per Tube QHS   Continuous Infusions:  feeding supplement (OSMOLITE 1.5 CAL) 1,000 mL (01/13/24 0825)     LOS: 55 days      Charise Killian, MD [facility] Pager [phone]  If 7PM-7AM, please contact night-coverage [email or web address] 01/13/2024, 8:35 AM

## 2024-01-14 DIAGNOSIS — I959 Hypotension, unspecified: Secondary | ICD-10-CM | POA: Diagnosis not present

## 2024-01-14 LAB — GLUCOSE, CAPILLARY
Glucose-Capillary: 105 mg/dL — ABNORMAL HIGH (ref 70–99)
Glucose-Capillary: 113 mg/dL — ABNORMAL HIGH (ref 70–99)
Glucose-Capillary: 117 mg/dL — ABNORMAL HIGH (ref 70–99)
Glucose-Capillary: 91 mg/dL (ref 70–99)
Glucose-Capillary: 98 mg/dL (ref 70–99)

## 2024-01-14 NOTE — Progress Notes (Signed)
 PROGRESS NOTE    Alex [name]  ZDG:[id number] DOB: [birth year] DOA: 11/19/2023 PCP: [facility]   Assessment & Plan:   Principal Problem:   Hypotension Active Problems:   Acute hypoxic respiratory failure (HCC)   Tachycardia   Septic shock (HCC)   Aspiration pneumonia (HCC)   Acute metabolic encephalopathy   Hypokalemia   Seizure disorder (HCC)   Pressure injury of skin   Reactive thrombocytosis   History of traumatic brain injury   Anemia, unspecified  Assessment and Plan: Hypotension: continue on midodrine    Acute hypoxic respiratory failure: s/p intubation, ventilation. S/p trach on 12/07/23. Continue w/ trach collar    Tachycardia: continue on metoprolol, cardizem    Septic shock: completed abx course. Resolved    Acute metabolic encephalopathy: likely secondary to to hx of TBI. Continue w/ supportive care    Aspiration pneumonia: likely secondary to pseudomonas & MRSA. Completed abxs. NPO except for ice chips as per speech. MBSS was canceled & will try again this week   Hypokalemia: resolved   Seizure disorder: continue on vimpat, valproic acid & gabapentin. EEG did not show any seizure activity    Normocytic anemia: H&H are stable. Will transfuse if Hb < 7.0    Hx of traumatic brain injury: bedbound and able to move right arm. Continue w/ supportive care  Reactive thrombocytosis: resolved    Pressure injury of skin: present on admission. Continue w/ supportive care      DVT prophylaxis: lovenox  Code Status: full  Family Communication:  Disposition Plan: possibly back to home facility. Pt's mother wants to know if the pt can be moved to another facility closer to home   Level of care: Med-Surg  Status is: Inpatient Remains inpatient appropriate because: medically stable. Waiting to go back to home facility     Consultants:    Procedures:   Antimicrobials:    Subjective: Pt appears comfortable   Objective: Vitals:    01/13/24 2035 01/13/24 2259 01/14/24 0400 01/14/24 0500  BP: 114/78  112/75   Pulse: (!) 104  93   Resp: 20  16   Temp: 97.8 F (36.6 C)  98.7 F (37.1 C)   TempSrc:      SpO2: 98% 96%    Weight:    64.1 kg  Height:        Intake/Output Summary (Last 24 hours) at 01/14/2024 0827 Last data filed at 01/14/2024 0737 Gross per 24 hour  Intake 6327 ml  Output 1200 ml  Net 5127 ml   Filed Weights   01/12/24 0640 01/13/24 0500 01/14/24 0500  Weight: 66.3 kg 66.1 kg 64.1 kg    Examination:  General exam: Appears calm & comfortable  Respiratory system: diminished breath sounds b/l   Cardiovascular system: S1/S2+. No rubs or clicks  Gastrointestinal system: abd is soft, NT, ND & hypoactive bowel sounds  Central nervous system: awake. Unable to follow simple commands Psychiatry: judgement and insight appears poor     Data Reviewed: I have personally reviewed following labs and imaging studies  CBC: Recent Labs  Lab 01/11/24 0258  WBC 4.5  HGB 12.1*  HCT 37.6*  MCV 93.5  PLT 245   Basic Metabolic Panel: Recent Labs  Lab 01/11/24 0258  NA 142  K 4.1  CL 102  CO2 27  GLUCOSE 91  BUN 24*  CREATININE 0.46*  CALCIUM 9.5   GFR: Estimated Creatinine Clearance: 124.6 mL/min (A) (by C-G formula based on SCr of  0.46 mg/dL (L)). Liver Function Tests: No results for input(s): "AST", "ALT", "ALKPHOS", "BILITOT", "PROT", "ALBUMIN" in the last 168 hours. No results for input(s): "LIPASE", "AMYLASE" in the last 168 hours. No results for input(s): "AMMONIA" in the last 168 hours. Coagulation Profile: No results for input(s): "INR", "PROTIME" in the last 168 hours. Cardiac Enzymes: No results for input(s): "CKTOTAL", "CKMB", "CKMBINDEX", "TROPONINI" in the last 168 hours. BNP (last 3 results) No results for input(s): "PROBNP" in the last 8760 hours. HbA1C: No results for input(s): "HGBA1C" in the last 72 hours. CBG: Recent Labs  Lab 01/13/24 0803 01/13/24 1142  01/13/24 1640 01/14/24 0023 01/14/24 0631  GLUCAP 110* 110* 102* 98 113*   Lipid Profile: No results for input(s): "CHOL", "HDL", "LDLCALC", "TRIG", "CHOLHDL", "LDLDIRECT" in the last 72 hours. Thyroid Function Tests: No results for input(s): "TSH", "T4TOTAL", "FREET4", "T3FREE", "THYROIDAB" in the last 72 hours. Anemia Panel: No results for input(s): "VITAMINB12", "FOLATE", "FERRITIN", "TIBC", "IRON", "RETICCTPCT" in the last 72 hours. Sepsis Labs: No results for input(s): "PROCALCITON", "LATICACIDVEN" in the last 168 hours.  No results found for this or any previous visit (from the past 240 hours).       Radiology Studies: No results found.      Scheduled Meds:  baclofen  10 mg Per Tube TID   diltiazem  30 mg Per Tube Q6H   enoxaparin (LOVENOX) injection  40 mg Subcutaneous Q24H   feeding supplement (PROSource TF20)  60 mL Per Tube Daily   fiber supplement (BANATROL TF)  60 mL Per Tube BID   free water  30 mL Per Tube Q4H   gabapentin  300 mg Per Tube Q8H   lacosamide  100 mg Per Tube BID   metoprolol tartrate  25 mg Per Tube BID   midodrine  15 mg Per Tube TID WC   nutrition supplement (JUVEN)  1 packet Per Tube BID BM   omeprazole  20 mg Per Tube BID   oxyCODONE  5 mg Per Tube Q6H   QUEtiapine  50 mg Per Tube TID   sodium chloride flush  10-40 mL Intracatheter Q12H   valproic acid  500 mg Per Tube Daily   valproic acid  750 mg Per Tube QHS   Continuous Infusions:  feeding supplement (OSMOLITE 1.5 CAL) 1,000 mL (01/14/24 0334)     LOS: 56 days      Charise Killian, MD [facility] Pager [phone]  If 7PM-7AM, please contact night-coverage [email or web address] 01/14/2024, 8:27 AM

## 2024-01-14 NOTE — Plan of Care (Signed)
  Problem: Fluid Volume: Goal: Hemodynamic stability will improve Outcome: Progressing   Problem: Clinical Measurements: Goal: Diagnostic test results will improve Outcome: Progressing   Problem: Respiratory: Goal: Ability to maintain adequate ventilation will improve Outcome: Progressing   Problem: Activity: Goal: Ability to tolerate increased activity will improve Outcome: Progressing   Problem: Nutrition: Goal: Adequate nutrition will be maintained Outcome: Progressing   Problem: Elimination: Goal: Will not experience complications related to bowel motility Outcome: Progressing   Problem: Pain Managment: Goal: General experience of comfort will improve and/or be controlled Outcome: Progressing   Problem: Safety: Goal: Ability to remain free from injury will improve Outcome: Progressing   Problem: Skin Integrity: Goal: Risk for impaired skin integrity will decrease Outcome: Progressing

## 2024-01-15 ENCOUNTER — Inpatient Hospital Stay

## 2024-01-15 DIAGNOSIS — I959 Hypotension, unspecified: Secondary | ICD-10-CM | POA: Diagnosis not present

## 2024-01-15 LAB — GLUCOSE, CAPILLARY
Glucose-Capillary: 93 mg/dL (ref 70–99)
Glucose-Capillary: 82 mg/dL (ref 70–99)
Glucose-Capillary: 142 mg/dL — ABNORMAL HIGH (ref 70–99)
Glucose-Capillary: 111 mg/dL — ABNORMAL HIGH (ref 70–99)

## 2024-01-15 NOTE — TOC Progression Note (Addendum)
 Transition of Care [facility]) - Progression Note    Patient Details  Name: Alex [name] MRN: [id number] Date of Birth: 1996/08/16  [facility]) CM/SW Contact  Margarito Liner, LCSW Phone Number: 01/15/2024, 2:41 PM  Clinical Narrative: [facility] admissions coordinator is checking to see if they can still take patient back at the end of the month.    3:53 pm: [facility] will be accepting uncuffed trachs in three weeks as long as everyone is trained and ready at the facility. CSW left voicemail for the admissions coordinator at [facility].  Expected Discharge Plan: Skilled Nursing Facility Barriers to Discharge: Continued Medical Work up  Expected Discharge Plan and Services       Living arrangements for the past 2 months: Skilled Nursing Facility                                       Social Determinants of Health (SDOH) Interventions SDOH Screenings   Food Insecurity: Patient Unable To Answer (11/20/2023)  Housing: Patient Unable To Answer (11/20/2023)  Transportation Needs: Patient Unable To Answer (11/20/2023)  Utilities: Patient Unable To Answer (11/20/2023)  Financial Resource Strain: Low Risk  (12/02/2021)   Received from [facility]  Tobacco Use: Low Risk  (11/19/2023)    Readmission Risk Interventions     No data to display

## 2024-01-15 NOTE — Progress Notes (Signed)
 Patient has been tolerating trach collar for several weeks.  Difficulty tolerating PMV.  Requested by speech pathology for changing to cuffless tracheostomy tube to help with PMV tolerance.  Trach changed to size 6 cuffless without diffculty.  Good air movement following placement.
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
## 2024-01-15 NOTE — Plan of Care (Signed)
  Problem: Fluid Volume: Goal: Hemodynamic stability will improve Outcome: Progressing   Problem: Clinical Measurements: Goal: Diagnostic test results will improve Outcome: Progressing Goal: Signs and symptoms of infection will decrease Outcome: Progressing   Problem: Respiratory: Goal: Ability to maintain adequate ventilation will improve Outcome: Progressing   Problem: Activity: Goal: Ability to tolerate increased activity will improve Outcome: Progressing   Problem: Respiratory: Goal: Ability to maintain a clear airway and adequate ventilation will improve Outcome: Progressing   Problem: Role Relationship: Goal: Method of communication will improve Outcome: Progressing   Problem: Education: Goal: Knowledge of General Education information will improve Description: Including pain rating scale, medication(s)/side effects and non-pharmacologic comfort measures Outcome: Progressing   Problem: Health Behavior/Discharge Planning: Goal: Ability to manage health-related needs will improve Outcome: Progressing   Problem: Clinical Measurements: Goal: Ability to maintain clinical measurements within normal limits will improve Outcome: Progressing Goal: Will remain free from infection Outcome: Progressing Goal: Diagnostic test results will improve Outcome: Progressing Goal: Respiratory complications will improve Outcome: Progressing Goal: Cardiovascular complication will be avoided Outcome: Progressing   Problem: Activity: Goal: Risk for activity intolerance will decrease Outcome: Progressing   Problem: Nutrition: Goal: Adequate nutrition will be maintained Outcome: Progressing   Problem: Coping: Goal: Level of anxiety will decrease Outcome: Progressing   Problem: Elimination: Goal: Will not experience complications related to bowel motility Outcome: Progressing Goal: Will not experience complications related to urinary retention Outcome: Progressing   Problem:  Pain Managment: Goal: General experience of comfort will improve and/or be controlled Outcome: Progressing   Problem: Safety: Goal: Ability to remain free from injury will improve Outcome: Progressing   Problem: Skin Integrity: Goal: Risk for impaired skin integrity will decrease Outcome: Progressing   Problem: Activity: Goal: Ability to tolerate increased activity will improve Outcome: Progressing   Problem: Respiratory: Goal: Ability to maintain a clear airway and adequate ventilation will improve Outcome: Progressing   Problem: Role Relationship: Goal: Method of communication will improve Outcome: Progressing
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
## 2024-01-16 DIAGNOSIS — J9601 Acute respiratory failure with hypoxia: Secondary | ICD-10-CM | POA: Diagnosis not present

## 2024-01-16 DIAGNOSIS — G9341 Metabolic encephalopathy: Secondary | ICD-10-CM | POA: Diagnosis not present

## 2024-01-16 DIAGNOSIS — A419 Sepsis, unspecified organism: Secondary | ICD-10-CM | POA: Diagnosis not present

## 2024-01-16 DIAGNOSIS — R000 Tachycardia, unspecified: Secondary | ICD-10-CM | POA: Diagnosis not present

## 2024-01-16 LAB — GLUCOSE, CAPILLARY
Glucose-Capillary: 82 mg/dL (ref 70–99)
Glucose-Capillary: 82 mg/dL (ref 70–99)
Glucose-Capillary: 88 mg/dL (ref 70–99)
Glucose-Capillary: 94 mg/dL (ref 70–99)

## 2024-01-16 MED ORDER — OXYCODONE HCL 5 MG PO TABS
5.0000 mg | ORAL_TABLET | Freq: Four times a day (QID) | ORAL | Status: DC | PRN
  Administered 2024-01-17 – 2024-05-01 (×23): 5 mg
  Filled 2024-01-16 (×26): qty 1
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
## 2024-01-16 NOTE — Plan of Care (Signed)
   Problem: Fluid Volume: Goal: Hemodynamic stability will improve Outcome: Progressing   Problem: Clinical Measurements: Goal: Diagnostic test results will improve Outcome: Progressing Goal: Signs and symptoms of infection will decrease Outcome: Progressing   Problem: Respiratory: Goal: Ability to maintain adequate ventilation will improve Outcome: Progressing

## 2024-01-16 NOTE — Plan of Care (Signed)
  Problem: Fluid Volume: Goal: Hemodynamic stability will improve Outcome: Progressing   Problem: Clinical Measurements: Goal: Diagnostic test results will improve Outcome: Progressing Goal: Signs and symptoms of infection will decrease Outcome: Progressing   Problem: Respiratory: Goal: Ability to maintain adequate ventilation will improve Outcome: Progressing   Problem: Activity: Goal: Ability to tolerate increased activity will improve Outcome: Progressing   Problem: Respiratory: Goal: Ability to maintain a clear airway and adequate ventilation will improve Outcome: Progressing   Problem: Role Relationship: Goal: Method of communication will improve Outcome: Progressing   Problem: Education: Goal: Knowledge of General Education information will improve Description: Including pain rating scale, medication(s)/side effects and non-pharmacologic comfort measures Outcome: Progressing   Problem: Health Behavior/Discharge Planning: Goal: Ability to manage health-related needs will improve Outcome: Progressing   Problem: Clinical Measurements: Goal: Ability to maintain clinical measurements within normal limits will improve Outcome: Progressing Goal: Will remain free from infection Outcome: Progressing Goal: Diagnostic test results will improve Outcome: Progressing Goal: Respiratory complications will improve Outcome: Progressing Goal: Cardiovascular complication will be avoided Outcome: Progressing   Problem: Activity: Goal: Risk for activity intolerance will decrease Outcome: Progressing   Problem: Nutrition: Goal: Adequate nutrition will be maintained Outcome: Progressing   Problem: Coping: Goal: Level of anxiety will decrease Outcome: Progressing   Problem: Elimination: Goal: Will not experience complications related to bowel motility Outcome: Progressing Goal: Will not experience complications related to urinary retention Outcome: Progressing   Problem:  Pain Managment: Goal: General experience of comfort will improve and/or be controlled Outcome: Progressing   Problem: Safety: Goal: Ability to remain free from injury will improve Outcome: Progressing   Problem: Skin Integrity: Goal: Risk for impaired skin integrity will decrease Outcome: Progressing   Problem: Activity: Goal: Ability to tolerate increased activity will improve Outcome: Progressing   Problem: Respiratory: Goal: Ability to maintain a clear airway and adequate ventilation will improve Outcome: Progressing   Problem: Role Relationship: Goal: Method of communication will improve Outcome: Progressing
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
## 2024-01-17 DIAGNOSIS — A419 Sepsis, unspecified organism: Secondary | ICD-10-CM | POA: Diagnosis not present

## 2024-01-17 DIAGNOSIS — R000 Tachycardia, unspecified: Secondary | ICD-10-CM | POA: Diagnosis not present

## 2024-01-17 DIAGNOSIS — G9341 Metabolic encephalopathy: Secondary | ICD-10-CM | POA: Diagnosis not present

## 2024-01-17 DIAGNOSIS — J9601 Acute respiratory failure with hypoxia: Secondary | ICD-10-CM | POA: Diagnosis not present

## 2024-01-17 LAB — GLUCOSE, CAPILLARY
Glucose-Capillary: 100 mg/dL — ABNORMAL HIGH (ref 70–99)
Glucose-Capillary: 105 mg/dL — ABNORMAL HIGH (ref 70–99)
Glucose-Capillary: 114 mg/dL — ABNORMAL HIGH (ref 70–99)
Glucose-Capillary: 143 mg/dL — ABNORMAL HIGH (ref 70–99)
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
## 2024-01-17 NOTE — Progress Notes (Signed)
 Progress Note   Patient: Alex [name]:[id number] DOB: Jun 19, 1996 DOA: 11/19/2023     59 DOS: the patient was seen and examined on 01/17/2024   Brief hospital course: 28 yo M presenting to [facility] ED from outpatient rehab on 11/19/23 for evaluation of altered mental status.   History obtained per chart review and mother's telephone interview, patient unable to participate in interview due to respiratory distress and baseline TBI. 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 He was treated at [facility] for 5 months then discharged to rehab. Per his mother and legal guardian his baseline is: Non verbal but he will yell out sporadic nonsensical words with left sided paralysis. He is able to move his RUE in order to feed himself with finger foods and he has some involuntary movement with that arm, he will swat at you. Similar baseline function with RLE, he can kick and move, but often will lay it bent and to the side. He has enough strength to even try and get out of bed on the right side. She denies any issues with swallowing, but eats mostly soft food. If he is not watched closely with eating he will try to eat all the food at once. The facility staff reported he was at his normal baseline until lunchtime on 11/19/23. It was observed he was more somnolent and not interactive. Staff noted a cough and slightly increased work of breathing. Mom also confirmed noting a congested cough the last time she visited earlier in the week. Staff and mom denied nausea/ vomiting, but mom reports chronic loose stools.   EMS reported the patient febrile and tachycardic on arrival.   ED course: Upon arrival patient tachycardic and lethargic. Sepsis protocol initiated with antibiotics and IVF resuscitation. Labs significant for mild hypokalemia, otherwise WNL. Initial imaging unremarkable but then patient became hypoxic with SpO2 85% on RA and a CTa was obtained, negative for PE but concerning for  aspiration pneumonia.  11/20/23: Admit to ICU due to acute hypoxic respiratory failure requiring urgent intubation and mechanical ventilatory support secondary to suspected aspiration and pneumonia 11/21/23- patient moving RUE, mother at bedside we reviewed medical plan. He remains on 22mcg/kg/hr levophed, today plan to rescusitate more aggresively and wean from levophed and potentially extubate post SBP.   He is febrile this am.  He is on zithromax, unasyn 11/22/23- s/p bronch yesterday with aspiration of mucus plugging.  Today resp status improved with liberation protocol in proces. SLP post extubation today. 11/23/23- patient is +for pseudomonas resp cultures.  He is also MRSA pcr +, refined therapy during rounds with pharmacist. He remains on MV 11/24/23- patient is still on vasopressor support weaning down on MV.  Secretions are slightly better.  11/25/23- patient weaned off levophed, failed SBT with tachypnea, tachycardia.  Secretions much improved. 11/26/23- on minimal vent support, unable to perform SBT due to copious secretions from ETT.   11/27/23- on minimal vent support, secretions improved.  Change Doxycycline to Vancomycin.  Plan for SBT as tolerated. 11/28/23-Pt successfully extubated 02/4, currently tolerating HHFNC @35L /35%.  Required low dose precedex overnight to allow suctioning due to excessive secretions. Pt with suspected seizure activity developed tremors in the right upper extremity and became minimally responsive.  Received 1g of iv keppra.   11/29/23-Overnight pt developed sinus tachycardia/svt 140 to 160's improved following 2.5 mg iv metoprolol.  Tolerating RA with no signs of respiratory distress.  Transferring to progressive care unit TRH to pick  on 02/7 12/01/23: Pt transferred back to ICU with severe acute hypoxic respiratory failure initially placed on HHFNC but due to significant hypoxia O2 sats in the 60's he required reintubation and underwent emergent bronchoscopy which revealed thick  secretions in the LUL and LLL resulting in mucous plugging. Therapeutic aspiration performed. CXR showed collapse of the LUL secondary to mucus plugging  12/03/23: Pt remains mechanically intubated on minimal vent settings.  ENT consulted for tracheostomy placement due to recurrent respiratory failure due to inability to clear secretions.  Requiring levophed gtt to maintain map 65 or higher  12/04/23: No acute events overnight on minimal settings pending tracheostomy placement  12/05/23: No acute events overnight, on minimal vent support. Initially on low dose Levophed, now weaned off.  IR placed PEG tube. Pending Tracheostomy placement on Friday. 12/06/23: No acute events overnight, on minimal vent support. Awaiting Tracheostomy placement tomorrow. 12/07/23: No acute events overnight.  Tracheostomy placed this morning by ENT, no events with procedure. Will keep sedated today with newly placed Trach with plan for WUA/SBT tomorrow. 12/08/23: All sedation turned off for WUA will perform SBT or TCT as tolerated.  Pt spike at temp 100.9 F, hypotensive requiring levophed gtt, and tachycardic concerning for possible sepsis.  Blood culture/tracheal aspirate and UA sent.  Restarted broad spectrum abx (vancomycin/zosyn) 12/09/23: Pt no longer requiring levophed gtt.  Tmax 102.4 F 12/10/23: Central Line Removed, not requiring pressors or sedation. Tolearting TC. 12/11/23: Overnight was started back on Gabapentin and scheduled Propanol for possible neurostorming with significant improvement in HR from the 170's to mid 100's 12/12/23: HR remains stable in low 100's, not requiring vasopressors, on minimal vent support, SBT as tolerated.  Complete course of ABX as suspect he is colonized with Pseudomonas and MRSA. 12/13/23: Overnight with concern for possible tube feeds in ETT tubing. Propanol was decreased due to soft BP. CXR this morning without new infiltrate, no leukocytosis or fever, and bowel pattern normal on KUB, will  restart tube feeds. Remains on minimal vent support, execise in PSV as tolerated ~ unable to tolerate less than 10/5 in PSV. 12/14/23: No significant events overnight.  Afebrile, hemodynamically stable.  On minimal vent support, continue with SBT/TCT as tolerated. 12/15/23: No change in patient's condition.  Remained on full vent support overnight.  Now on pressure support and tolerating. 12/16/23: Tolerated trach collar trials 12/17/23- patient awaiting placement.  He may have had another seizure and we will order EEG for him today.  He may not leave due to insurance qualification with trache.  12/18/23- Patient with no acute events overnight. Labs including cbc and bmp reviewed with no significant new findings.  12/19/2023.  Transferred to medical service.  Will transfer out of ICU.  Currently on trach collar 28% around 6 L flow.  Saturating well.  Started on Cardizem to control heart rate 2/27.  Increase midodrine to 15 mg 3 times daily.  Added metoprolol to control heart rate 2/28.  Titrating metoprolol to try to control heart rate. 2/25 to 3/4 TOC trying to work on placement. 3/5 patient had mucous plugging of the left mainstem bronchus 3/6 through 3/18.  Patient to go back to his facility but likely not the end of the month. 3/13.  Speech pathology cleared to go on ice chips and will do a modified swallow evaluation in the future. 3/19-3/25/25: Pt has remained medically stable this week. Speech will try to do MBSS this week providing pt will cooperate.  01/16/24- discussed with aunt and grand mom  at bedside, may not go to SNF until 3 weeks per TOC. 01/17/24- sleeping comfortably. No family at bedside,   Assessment and Plan: * Hypotension Midodrine 15 mg 3 times daily to maintain blood pressure  Acute hypoxic respiratory failure (HCC)  S/p trach on 12/07/23. Continue w/ trach collar. ENT follow up appreciated. Difficulty tolerating PMV. Trach changed to size 6.  Tachycardia Continue Cardizem  and metoprolol.  Septic shock (HCC) Resolved completed antibiotics.  On midodrine for blood pressure.    Acute metabolic encephalopathy In the setting of TBI.Marland Kitchen Patient able to move his right arm at baseline. Continue supportive care.  Aspiration pneumonia (HCC) Completed antibiotics.  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  Completed course of Zyvox and Zosyn.  Hypokalemia Replaced  Seizure disorder (HCC) Patient on valproic acid, Vimpat and gabapentin.  EEG did not show any seizure activity.  Anemia, unspecified Stable Hb.  No active bleeding. Last hemoglobin 12.1  01/11/24  History of traumatic brain injury Patient is bed bound. SNF resident.  Reactive thrombocytosis Last platelet count in normal range  Pressure injury of skin Present on admission.  See full description below. ,  Nutrition Documentation    Flowsheet Row ED to Hosp-Admission (Current) from 11/19/2023 in [facility] (1A)  Nutrition Problem Inadequate oral intake  Etiology inability to eat  [pt sedated and ventilated]  Nutrition Goal Patient will meet greater than or equal to 90% of their needs  Interventions Tube feeding, Prostat, Juven     ,  Active Pressure Injury/Wound(s)     Pressure Ulcer  Duration          Pressure Injury 11/20/23 Buttocks Left Stage 1 -  Intact skin with non-blanchable redness of a localized area usually over a bony prominence. 56 days   Pressure Injury 11/20/23 Foot Right;Medial;Distal Stage 1 -  Intact skin with non-blanchable redness of a localized area usually over a bony prominence. 56 days           (Optional):26781} Nursing supportive care. Fall, aspiration precautions. Diet:  Diet Orders (From admission, onward)     Start     Ordered   01/03/24 1126  Diet NPO time specified Except for: Ice Chips  Diet effective midnight       Comments: For tracheostomy placement on 02/14  Question:  Except for  Answer:  Ice Chips    01/03/24 1125           DVT prophylaxis: enoxaparin (LOVENOX) injection 40 mg Start: 12/09/23 2200 SCDs Start: 11/20/23 0140  Level of care: med-surg   Code Status: Full Code  Subjective: Patient is seen and examined today morning. He is sleeping comfortably, non verbal. No family at bedside. No overnight issues.  Physical Exam: Vitals:   01/16/24 2038 01/17/24 0404 01/17/24 0515 01/17/24 0900  BP: 129/88 (!) 141/79  133/78  Pulse: 99 95    Resp: 18 18  20   Temp: 97.6 F (36.4 C) 98.6 F (37 C)  98 F (36.7 C)  TempSrc: Oral Oral  Oral  SpO2: 100% 100% 100% 100%  Weight:  66.7 kg    Height:        General - Young  Caucasian non verbal male, sleeping, no distress HEENT - PERRLA, EOMI, trach collar noted. Lung - bilateral breath sounds, diffuse rhonchi noted. Heart - S1, S2 heard, no murmurs, rubs, trace pedal edema. Abdomen - Soft, non tender, bowel sounds good, PEG tube intact. Neuro - Sleeping, does not follow commands,  unable to do full neuro exam. Skin - Warm and dry.  Data Reviewed:      Latest Ref Rng & Units 01/11/2024    2:58 AM 01/05/2024    4:12 AM 12/28/2023    6:16 AM  CBC  WBC 4.0 - 10.5 K/uL 4.5  5.0  5.9   Hemoglobin 13.0 - 17.0 g/dL 01.0  27.2  53.6   Hematocrit 39.0 - 52.0 % 37.6  33.4  34.5   Platelets 150 - 400 K/uL 245  318  338       Latest Ref Rng & Units 01/11/2024    2:58 AM 01/05/2024    4:12 AM 12/31/2023    5:42 AM  BMP  Glucose 70 - 99 mg/dL 91  86  644   BUN 6 - 20 mg/dL 24  17  24    Creatinine 0.61 - 1.24 mg/dL 0.34  7.42  5.95   Sodium 135 - 145 mmol/L 142  138  142   Potassium 3.5 - 5.1 mmol/L 4.1  4.2  4.2   Chloride 98 - 111 mmol/L 102  105  105   CO2 22 - 32 mmol/L 27  26  27    Calcium 8.9 - 10.3 mg/dL 9.5  8.8  9.2    No results found.   Family Communication: Discussed with patient's aunt, grand mother yesterday at bedside. They understand and agree. All questions answered.  Disposition: Status is:  Inpatient Remains inpatient appropriate because: severity of illness, trach patient unable to go to prior SNF  Planned Discharge Destination: Skilled nursing facility     Time spent: 39 minutes  Author: Marcelino Duster, MD 01/17/2024 2:03 PM Secure chat 7am to 7pm For on call review [email or web address].

## 2024-01-17 NOTE — Plan of Care (Signed)
   Problem: Fluid Volume: Goal: Hemodynamic stability will improve Outcome: Progressing   Problem: Clinical Measurements: Goal: Diagnostic test results will improve Outcome: Progressing Goal: Signs and symptoms of infection will decrease Outcome: Progressing   Problem: Respiratory: Goal: Ability to maintain adequate ventilation will improve Outcome: Progressing

## 2024-01-17 NOTE — Plan of Care (Signed)
  Problem: Fluid Volume: Goal: Hemodynamic stability will improve Outcome: Progressing   Problem: Clinical Measurements: Goal: Diagnostic test results will improve Outcome: Progressing Goal: Signs and symptoms of infection will decrease Outcome: Progressing   Problem: Respiratory: Goal: Ability to maintain adequate ventilation will improve Outcome: Progressing   Problem: Activity: Goal: Ability to tolerate increased activity will improve Outcome: Progressing   Problem: Respiratory: Goal: Ability to maintain a clear airway and adequate ventilation will improve Outcome: Progressing   Problem: Role Relationship: Goal: Method of communication will improve Outcome: Progressing   Problem: Education: Goal: Knowledge of General Education information will improve Description: Including pain rating scale, medication(s)/side effects and non-pharmacologic comfort measures Outcome: Progressing   Problem: Health Behavior/Discharge Planning: Goal: Ability to manage health-related needs will improve Outcome: Progressing   Problem: Clinical Measurements: Goal: Ability to maintain clinical measurements within normal limits will improve Outcome: Progressing Goal: Will remain free from infection Outcome: Progressing Goal: Diagnostic test results will improve Outcome: Progressing Goal: Respiratory complications will improve Outcome: Progressing Goal: Cardiovascular complication will be avoided Outcome: Progressing   Problem: Activity: Goal: Risk for activity intolerance will decrease Outcome: Progressing   Problem: Nutrition: Goal: Adequate nutrition will be maintained Outcome: Progressing   Problem: Coping: Goal: Level of anxiety will decrease Outcome: Progressing   Problem: Elimination: Goal: Will not experience complications related to bowel motility Outcome: Progressing Goal: Will not experience complications related to urinary retention Outcome: Progressing   Problem:  Pain Managment: Goal: General experience of comfort will improve and/or be controlled Outcome: Progressing   Problem: Safety: Goal: Ability to remain free from injury will improve Outcome: Progressing   Problem: Skin Integrity: Goal: Risk for impaired skin integrity will decrease Outcome: Progressing   Problem: Activity: Goal: Ability to tolerate increased activity will improve Outcome: Progressing   Problem: Respiratory: Goal: Ability to maintain a clear airway and adequate ventilation will improve Outcome: Progressing   Problem: Role Relationship: Goal: Method of communication will improve Outcome: Progressing

## 2024-01-18 DIAGNOSIS — I959 Hypotension, unspecified: Secondary | ICD-10-CM | POA: Diagnosis not present

## 2024-01-18 LAB — GLUCOSE, CAPILLARY
Glucose-Capillary: 106 mg/dL — ABNORMAL HIGH (ref 70–99)
Glucose-Capillary: 130 mg/dL — ABNORMAL HIGH (ref 70–99)
Glucose-Capillary: 88 mg/dL (ref 70–99)
Glucose-Capillary: 94 mg/dL (ref 70–99)

## 2024-01-18 NOTE — TOC Progression Note (Signed)
 Transition of Care [facility]) - Progression Note    Patient Details  Name: Alex [name] MRN: [id number] Date of Birth: November 01, 1995  [facility] of [facility] CM/SW Contact  [name], LCSW Phone Number: 01/18/2024, 10:57 AM  Clinical Narrative:    Attempted call to Mother to update her, no answer.   Expected Discharge Plan: Skilled Nursing Facility Barriers to Discharge: Continued Medical Work up  Expected Discharge Plan and Services       Living arrangements for the past 2 months: Skilled Nursing Facility                                       Social Determinants of Health (SDOH) Interventions SDOH Screenings   Food Insecurity: Patient Unable To Answer (11/20/2023)  Housing: Patient Unable To Answer (11/20/2023)  Transportation Needs: Patient Unable To Answer (11/20/2023)  Utilities: Patient Unable To Answer (11/20/2023)  Financial Resource Strain: Low Risk  (12/02/2021)   Received from [facility]  Tobacco Use: Low Risk  (11/19/2023)    Readmission Risk Interventions     No data to display

## 2024-01-18 NOTE — Progress Notes (Signed)
 Progress Note   Patient: Alex [name] OZH:[id number] DOB: 1995-11-19 DOA: 11/19/2023     60 DOS: the patient was seen and examined on 01/18/2024   Brief hospital course: 28 yo M presenting to [facility] ED from outpatient rehab on 11/19/23 for evaluation of altered mental status.   History obtained per chart review and mother's telephone interview, patient unable to participate in interview due to respiratory distress and baseline TBI. 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 He was treated at [facility] for 5 months then discharged to rehab. Per his mother and legal guardian his baseline is: Non verbal but he will yell out sporadic nonsensical words with left sided paralysis. He is able to move his RUE in order to feed himself with finger foods and he has some involuntary movement with that arm, he will swat at you. Similar baseline function with RLE, he can kick and move, but often will lay it bent and to the side. He has enough strength to even try and get out of bed on the right side. She denies any issues with swallowing, but eats mostly soft food. If he is not watched closely with eating he will try to eat all the food at once. The facility staff reported he was at his normal baseline until lunchtime on 11/19/23. It was observed he was more somnolent and not interactive. Staff noted a cough and slightly increased work of breathing. Mom also confirmed noting a congested cough the last time she visited earlier in the week. Staff and mom denied nausea/ vomiting, but mom reports chronic loose stools.   EMS reported the patient febrile and tachycardic on arrival.   ED course: Upon arrival patient tachycardic and lethargic. Sepsis protocol initiated with antibiotics and IVF resuscitation. Labs significant for mild hypokalemia, otherwise WNL. Initial imaging unremarkable but then patient became hypoxic with SpO2 85% on RA and a CTa was obtained, negative for PE but concerning for  aspiration pneumonia.  11/20/23: Admit to ICU due to acute hypoxic respiratory failure requiring urgent intubation and mechanical ventilatory support secondary to suspected aspiration and pneumonia 11/21/23- patient moving RUE, mother at bedside we reviewed medical plan. He remains on 25mcg/kg/hr levophed, today plan to rescusitate more aggresively and wean from levophed and potentially extubate post SBP.   He is febrile this am.  He is on zithromax, unasyn 11/22/23- s/p bronch yesterday with aspiration of mucus plugging.  Today resp status improved with liberation protocol in proces. SLP post extubation today. 11/23/23- patient is +for pseudomonas resp cultures.  He is also MRSA pcr +, refined therapy during rounds with pharmacist. He remains on MV 11/24/23- patient is still on vasopressor support weaning down on MV.  Secretions are slightly better.  11/25/23- patient weaned off levophed, failed SBT with tachypnea, tachycardia.  Secretions much improved. 11/26/23- on minimal vent support, unable to perform SBT due to copious secretions from ETT.   11/27/23- on minimal vent support, secretions improved.  Change Doxycycline to Vancomycin.  Plan for SBT as tolerated. 11/28/23-Pt successfully extubated 02/4, currently tolerating HHFNC @35L /35%.  Required low dose precedex overnight to allow suctioning due to excessive secretions. Pt with suspected seizure activity developed tremors in the right upper extremity and became minimally responsive.  Received 1g of iv keppra.   11/29/23-Overnight pt developed sinus tachycardia/svt 140 to 160's improved following 2.5 mg iv metoprolol.  Tolerating RA with no signs of respiratory distress.  Transferring to progressive care unit TRH to pick  on 02/7 12/01/23: Pt transferred back to ICU with severe acute hypoxic respiratory failure initially placed on HHFNC but due to significant hypoxia O2 sats in the 60's he required reintubation and underwent emergent bronchoscopy which revealed thick  secretions in the LUL and LLL resulting in mucous plugging. Therapeutic aspiration performed. CXR showed collapse of the LUL secondary to mucus plugging  12/03/23: Pt remains mechanically intubated on minimal vent settings.  ENT consulted for tracheostomy placement due to recurrent respiratory failure due to inability to clear secretions.  Requiring levophed gtt to maintain map 65 or higher  12/04/23: No acute events overnight on minimal settings pending tracheostomy placement  12/05/23: No acute events overnight, on minimal vent support. Initially on low dose Levophed, now weaned off.  IR placed PEG tube. Pending Tracheostomy placement on Friday. 12/06/23: No acute events overnight, on minimal vent support. Awaiting Tracheostomy placement tomorrow. 12/07/23: No acute events overnight.  Tracheostomy placed this morning by ENT, no events with procedure. Will keep sedated today with newly placed Trach with plan for WUA/SBT tomorrow. 12/08/23: All sedation turned off for WUA will perform SBT or TCT as tolerated.  Pt spike at temp 100.9 F, hypotensive requiring levophed gtt, and tachycardic concerning for possible sepsis.  Blood culture/tracheal aspirate and UA sent.  Restarted broad spectrum abx (vancomycin/zosyn) 12/09/23: Pt no longer requiring levophed gtt.  Tmax 102.4 F 12/10/23: Central Line Removed, not requiring pressors or sedation. Tolearting TC. 12/11/23: Overnight was started back on Gabapentin and scheduled Propanol for possible neurostorming with significant improvement in HR from the 170's to mid 100's 12/12/23: HR remains stable in low 100's, not requiring vasopressors, on minimal vent support, SBT as tolerated.  Complete course of ABX as suspect he is colonized with Pseudomonas and MRSA. 12/13/23: Overnight with concern for possible tube feeds in ETT tubing. Propanol was decreased due to soft BP. CXR this morning without new infiltrate, no leukocytosis or fever, and bowel pattern normal on KUB, will  restart tube feeds. Remains on minimal vent support, execise in PSV as tolerated ~ unable to tolerate less than 10/5 in PSV. 12/14/23: No significant events overnight.  Afebrile, hemodynamically stable.  On minimal vent support, continue with SBT/TCT as tolerated. 12/15/23: No change in patient's condition.  Remained on full vent support overnight.  Now on pressure support and tolerating. 12/16/23: Tolerated trach collar trials 12/17/23- patient awaiting placement.  He may have had another seizure and we will order EEG for him today.  He may not leave due to insurance qualification with trache.  12/18/23- Patient with no acute events overnight. Labs including cbc and bmp reviewed with no significant new findings.  12/19/2023.  Transferred to medical service.  Will transfer out of ICU.  Currently on trach collar 28% around 6 L flow.  Saturating well.  Started on Cardizem to control heart rate 2/27.  Increase midodrine to 15 mg 3 times daily.  Added metoprolol to control heart rate 2/28.  Titrating metoprolol to try to control heart rate. 2/25 to 3/4 TOC trying to work on placement. 3/5 patient had mucous plugging of the left mainstem bronchus 3/6 through 3/18.  Patient to go back to his facility but likely not the end of the month. 3/13.  Speech pathology cleared to go on ice chips and will do a modified swallow evaluation in the future. 3/19-3/25/25: Pt has remained medically stable this week. Speech will try to do MBSS this week providing pt will cooperate.  01/16/24- discussed with aunt and grand mom  at bedside, may not go to SNF until 3 weeks per TOC. 01/17/24- sleeping comfortably. No family at bedside, 01/18/2024-no events reported by nursing overnight, patient is calm.   Assessment and Plan: * Hypotension Midodrine 15 mg 3 times daily to maintain blood pressure Blood pressure fairly stable  Acute hypoxic respiratory failure (HCC)  S/p trach on 12/07/23. Continue w/ trach collar. ENT follow up  appreciated. Difficulty tolerating PMV. Trach changed to size 6.  Tachycardia Continue Cardizem and metoprolol.  Septic shock (HCC) Resolved completed antibiotics.  On midodrine for blood pressure.    Acute metabolic encephalopathy In the setting of TBI.Marland Kitchen Patient able to move his right arm at baseline. Continue supportive care.  Aspiration pneumonia (HCC) Completed antibiotics.  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  Completed course of Zyvox and Zosyn.  Hypokalemia Replaced  Seizure disorder (HCC) Patient on valproic acid, Vimpat and gabapentin.  EEG did not show any seizure activity.  Anemia, unspecified Stable Hb.  No active bleeding. Last hemoglobin 12.1  01/11/24  History of traumatic brain injury Patient is bed bound. SNF resident.  Reactive thrombocytosis Last platelet count in normal range  Pressure injury of skin Present on admission.  See full description below. ,  Nutrition Documentation    Flowsheet Row ED to Hosp-Admission (Current) from 11/19/2023 in [facility] (1A)  Nutrition Problem Inadequate oral intake  Etiology inability to eat  [pt sedated and ventilated]  Nutrition Goal Patient will meet greater than or equal to 90% of their needs  Interventions Tube feeding, Prostat, Juven     ,  Active Pressure Injury/Wound(s)     Pressure Ulcer  Duration          Pressure Injury 11/20/23 Buttocks Left Stage 1 -  Intact skin with non-blanchable redness of a localized area usually over a bony prominence. 56 days   Pressure Injury 11/20/23 Foot Right;Medial;Distal Stage 1 -  Intact skin with non-blanchable redness of a localized area usually over a bony prominence. 56 days           (Optional):26781} Nursing supportive care. Fall, aspiration precautions. Diet:  Diet Orders (From admission, onward)     Start     Ordered   01/03/24 1126  Diet NPO time specified Except for: Ice Chips  Diet effective  midnight       Comments: For tracheostomy placement on 02/14  Question:  Except for  Answer:  Ice Chips   01/03/24 1125           DVT prophylaxis: enoxaparin (LOVENOX) injection 40 mg Start: 12/09/23 2200 SCDs Start: 11/20/23 0140  Level of care: med-surg   Code Status: Full Code  Subjective: Patient is seen and examined today morning. He is sleeping comfortably, non verbal. No family at bedside. No overnight issues.  Physical Exam: Vitals:   01/17/24 2053 01/18/24 0500 01/18/24 0723 01/18/24 0852  BP: 104/68 114/76  118/80  Pulse: (!) 134 90    Resp: 18 19  20   Temp: 98.3 F (36.8 C) 98.3 F (36.8 C)  (!) 97.5 F (36.4 C)  TempSrc: Oral Axillary  Oral  SpO2:  100%  100%  Weight:   67.5 kg   Height:        General - Young  Caucasian non verbal male, sleeping, no distress HEENT - PERRLA, EOMI, trach collar noted. Lung - bilateral breath sounds, diffuse rhonchi noted. Heart - S1, S2 heard, no murmurs, rubs, trace pedal edema. Abdomen - Soft, non  tender, bowel sounds good, PEG tube intact. Neuro - Sleeping, does not follow commands, unable to do full neuro exam. Skin - Warm and dry.  Data Reviewed:      Latest Ref Rng & Units 01/11/2024    2:58 AM 01/05/2024    4:12 AM 12/28/2023    6:16 AM  CBC  WBC 4.0 - 10.5 K/uL 4.5  5.0  5.9   Hemoglobin 13.0 - 17.0 g/dL 16.1  09.6  04.5   Hematocrit 39.0 - 52.0 % 37.6  33.4  34.5   Platelets 150 - 400 K/uL 245  318  338       Latest Ref Rng & Units 01/11/2024    2:58 AM 01/05/2024    4:12 AM 12/31/2023    5:42 AM  BMP  Glucose 70 - 99 mg/dL 91  86  409   BUN 6 - 20 mg/dL 24  17  24    Creatinine 0.61 - 1.24 mg/dL 8.11  9.14  7.82   Sodium 135 - 145 mmol/L 142  138  142   Potassium 3.5 - 5.1 mmol/L 4.1  4.2  4.2   Chloride 98 - 111 mmol/L 102  105  105   CO2 22 - 32 mmol/L 27  26  27    Calcium 8.9 - 10.3 mg/dL 9.5  8.8  9.2    No results found.   Family Communication: Discussed with patient's aunt, grand mother  yesterday at bedside. They understand and agree. All questions answered.  Disposition: Status is: Inpatient Remains inpatient appropriate because: severity of illness, trach patient unable to go to prior SNF  Planned Discharge Destination: Skilled nursing facility     Time spent: 39 minutes  Author: Harold Hedge, MD 01/18/2024 1:36 PM Secure chat 7am to 7pm For on call review [email or web address].

## 2024-01-18 NOTE — Plan of Care (Signed)
  Problem: Activity: Goal: Ability to tolerate increased activity will improve Outcome: Progressing   Problem: Pain Managment: Goal: General experience of comfort will improve and/or be controlled Outcome: Progressing   Problem: Safety: Goal: Ability to remain free from injury will improve Outcome: Progressing   Problem: Skin Integrity: Goal: Risk for impaired skin integrity will decrease Outcome: Progressing

## 2024-01-18 NOTE — Plan of Care (Signed)
   Problem: Fluid Volume: Goal: Hemodynamic stability will improve Outcome: Progressing   Problem: Clinical Measurements: Goal: Diagnostic test results will improve Outcome: Progressing Goal: Signs and symptoms of infection will decrease Outcome: Progressing   Problem: Respiratory: Goal: Ability to maintain adequate ventilation will improve Outcome: Progressing

## 2024-01-19 DIAGNOSIS — I959 Hypotension, unspecified: Secondary | ICD-10-CM | POA: Diagnosis not present

## 2024-01-19 LAB — GLUCOSE, CAPILLARY
Glucose-Capillary: 100 mg/dL — ABNORMAL HIGH (ref 70–99)
Glucose-Capillary: 114 mg/dL — ABNORMAL HIGH (ref 70–99)
Glucose-Capillary: 143 mg/dL — ABNORMAL HIGH (ref 70–99)
Glucose-Capillary: 87 mg/dL (ref 70–99)
Glucose-Capillary: 93 mg/dL (ref 70–99)

## 2024-01-19 NOTE — Progress Notes (Signed)
 Progress Note   Patient: Alex [name]:[id number] DOB: August 11, 1996 DOA: 11/19/2023     61 DOS: the patient was seen and examined on 01/19/2024   Brief hospital course: 28 yo M presenting to [facility] ED from outpatient rehab on 11/19/23 for evaluation of altered mental status.   History obtained per chart review and mother's telephone interview, patient unable to participate in interview due to respiratory distress and baseline TBI. 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 He was treated at [facility] for 5 months then discharged to rehab. Per his mother and legal guardian his baseline is: Non verbal but he will yell out sporadic nonsensical words with left sided paralysis. He is able to move his RUE in order to feed himself with finger foods and he has some involuntary movement with that arm, he will swat at you. Similar baseline function with RLE, he can kick and move, but often will lay it bent and to the side. He has enough strength to even try and get out of bed on the right side. She denies any issues with swallowing, but eats mostly soft food. If he is not watched closely with eating he will try to eat all the food at once. The facility staff reported he was at his normal baseline until lunchtime on 11/19/23. It was observed he was more somnolent and not interactive. Staff noted a cough and slightly increased work of breathing. Mom also confirmed noting a congested cough the last time she visited earlier in the week. Staff and mom denied nausea/ vomiting, but mom reports chronic loose stools.   EMS reported the patient febrile and tachycardic on arrival.   ED course: Upon arrival patient tachycardic and lethargic. Sepsis protocol initiated with antibiotics and IVF resuscitation. Labs significant for mild hypokalemia, otherwise WNL. Initial imaging unremarkable but then patient became hypoxic with SpO2 85% on RA and a CTa was obtained, negative for PE but concerning for  aspiration pneumonia.  11/20/23: Admit to ICU due to acute hypoxic respiratory failure requiring urgent intubation and mechanical ventilatory support secondary to suspected aspiration and pneumonia 11/21/23- patient moving RUE, mother at bedside we reviewed medical plan. He remains on 13mcg/kg/hr levophed, today plan to rescusitate more aggresively and wean from levophed and potentially extubate post SBP.   He is febrile this am.  He is on zithromax, unasyn 11/22/23- s/p bronch yesterday with aspiration of mucus plugging.  Today resp status improved with liberation protocol in proces. SLP post extubation today. 11/23/23- patient is +for pseudomonas resp cultures.  He is also MRSA pcr +, refined therapy during rounds with pharmacist. He remains on MV 11/24/23- patient is still on vasopressor support weaning down on MV.  Secretions are slightly better.  11/25/23- patient weaned off levophed, failed SBT with tachypnea, tachycardia.  Secretions much improved. 11/26/23- on minimal vent support, unable to perform SBT due to copious secretions from ETT.   11/27/23- on minimal vent support, secretions improved.  Change Doxycycline to Vancomycin.  Plan for SBT as tolerated. 11/28/23-Pt successfully extubated 02/4, currently tolerating HHFNC @35L /35%.  Required low dose precedex overnight to allow suctioning due to excessive secretions. Pt with suspected seizure activity developed tremors in the right upper extremity and became minimally responsive.  Received 1g of iv keppra.   11/29/23-Overnight pt developed sinus tachycardia/svt 140 to 160's improved following 2.5 mg iv metoprolol.  Tolerating RA with no signs of respiratory distress.  Transferring to progressive care unit TRH to pick  on 02/7 12/01/23: Pt transferred back to ICU with severe acute hypoxic respiratory failure initially placed on HHFNC but due to significant hypoxia O2 sats in the 60's he required reintubation and underwent emergent bronchoscopy which revealed thick  secretions in the LUL and LLL resulting in mucous plugging. Therapeutic aspiration performed. CXR showed collapse of the LUL secondary to mucus plugging  12/03/23: Pt remains mechanically intubated on minimal vent settings.  ENT consulted for tracheostomy placement due to recurrent respiratory failure due to inability to clear secretions.  Requiring levophed gtt to maintain map 65 or higher  12/04/23: No acute events overnight on minimal settings pending tracheostomy placement  12/05/23: No acute events overnight, on minimal vent support. Initially on low dose Levophed, now weaned off.  IR placed PEG tube. Pending Tracheostomy placement on Friday. 12/06/23: No acute events overnight, on minimal vent support. Awaiting Tracheostomy placement tomorrow. 12/07/23: No acute events overnight.  Tracheostomy placed this morning by ENT, no events with procedure. Will keep sedated today with newly placed Trach with plan for WUA/SBT tomorrow. 12/08/23: All sedation turned off for WUA will perform SBT or TCT as tolerated.  Pt spike at temp 100.9 F, hypotensive requiring levophed gtt, and tachycardic concerning for possible sepsis.  Blood culture/tracheal aspirate and UA sent.  Restarted broad spectrum abx (vancomycin/zosyn) 12/09/23: Pt no longer requiring levophed gtt.  Tmax 102.4 F 12/10/23: Central Line Removed, not requiring pressors or sedation. Tolearting TC. 12/11/23: Overnight was started back on Gabapentin and scheduled Propanol for possible neurostorming with significant improvement in HR from the 170's to mid 100's 12/12/23: HR remains stable in low 100's, not requiring vasopressors, on minimal vent support, SBT as tolerated.  Complete course of ABX as suspect he is colonized with Pseudomonas and MRSA. 12/13/23: Overnight with concern for possible tube feeds in ETT tubing. Propanol was decreased due to soft BP. CXR this morning without new infiltrate, no leukocytosis or fever, and bowel pattern normal on KUB, will  restart tube feeds. Remains on minimal vent support, execise in PSV as tolerated ~ unable to tolerate less than 10/5 in PSV. 12/14/23: No significant events overnight.  Afebrile, hemodynamically stable.  On minimal vent support, continue with SBT/TCT as tolerated. 12/15/23: No change in patient's condition.  Remained on full vent support overnight.  Now on pressure support and tolerating. 12/16/23: Tolerated trach collar trials 12/17/23- patient awaiting placement.  He may have had another seizure and we will order EEG for him today.  He may not leave due to insurance qualification with trache.  12/18/23- Patient with no acute events overnight. Labs including cbc and bmp reviewed with no significant new findings.  12/19/2023.  Transferred to medical service.  Will transfer out of ICU.  Currently on trach collar 28% around 6 L flow.  Saturating well.  Started on Cardizem to control heart rate 2/27.  Increase midodrine to 15 mg 3 times daily.  Added metoprolol to control heart rate 2/28.  Titrating metoprolol to try to control heart rate. 2/25 to 3/4 TOC trying to work on placement. 3/5 patient had mucous plugging of the left mainstem bronchus 3/6 through 3/18.  Patient to go back to his facility but likely not the end of the month. 3/13.  Speech pathology cleared to go on ice chips and will do a modified swallow evaluation in the future. 3/19-3/25/25: Pt has remained medically stable this week. Speech will try to do MBSS this week providing pt will cooperate.  01/16/24- discussed with aunt and grand mom  at bedside, may not go to SNF until 3 weeks per TOC. 01/17/24- sleeping comfortably. No family at bedside, 01/18/2024-no events reported by nursing overnight, patient is calm.   Assessment and Plan: * Hypotension Midodrine 15 mg 3 times daily to maintain blood pressure Blood pressure fairly stable  Acute hypoxic respiratory failure (HCC)  S/p trach on 12/07/23. Continue w/ trach collar. ENT follow up  appreciated. Difficulty tolerating PMV. Trach changed to size 6.  Tachycardia Continue Cardizem and metoprolol.  Septic shock (HCC) Resolved completed antibiotics.  On midodrine for blood pressure.    Acute metabolic encephalopathy In the setting of TBI.Marland Kitchen Patient able to move his right arm at baseline. Continue supportive care.  Aspiration pneumonia (HCC) Completed antibiotics.  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  Completed course of Zyvox and Zosyn.  Hypokalemia Replaced  Seizure disorder (HCC) Patient on valproic acid, Vimpat and gabapentin.  EEG did not show any seizure activity.  Anemia, unspecified Stable Hb.  No active bleeding. Last hemoglobin 12.1  01/11/24  History of traumatic brain injury Patient is bed bound. SNF resident.  Reactive thrombocytosis Last platelet count in normal range  Pressure injury of skin Present on admission.  See full description below. ,  Nutrition Documentation    Flowsheet Row ED to Hosp-Admission (Current) from 11/19/2023 in [facility] (1A)  Nutrition Problem Inadequate oral intake  Etiology inability to eat  [pt sedated and ventilated]  Nutrition Goal Patient will meet greater than or equal to 90% of their needs  Interventions Tube feeding, Prostat, Juven     ,  Active Pressure Injury/Wound(s)     Pressure Ulcer  Duration          Pressure Injury 11/20/23 Buttocks Left Stage 1 -  Intact skin with non-blanchable redness of a localized area usually over a bony prominence. 56 days   Pressure Injury 11/20/23 Foot Right;Medial;Distal Stage 1 -  Intact skin with non-blanchable redness of a localized area usually over a bony prominence. 56 days           (Optional):26781} Nursing supportive care. Fall, aspiration precautions. Diet:  Diet Orders (From admission, onward)     Start     Ordered   01/03/24 1126  Diet NPO time specified Except for: Ice Chips  Diet effective  midnight       Comments: For tracheostomy placement on 02/14  Question:  Except for  Answer:  Ice Chips   01/03/24 1125           DVT prophylaxis: enoxaparin (LOVENOX) injection 40 mg Start: 12/09/23 2200 SCDs Start: 11/20/23 0140  Level of care: med-surg   Code Status: Full Code  Subjective:  Pt was awake, waving his right arm, making non-sensical words.   Physical Exam: Vitals:   01/19/24 0427 01/19/24 0500 01/19/24 0736 01/19/24 1549  BP: 106/66  132/79 131/86  Pulse: (!) 102  (!) 103 97  Resp: 19  17 16   Temp: 98.1 F (36.7 C)  97.7 F (36.5 C) 97.6 F (36.4 C)  TempSrc:   Oral Oral  SpO2: 100%  96% 98%  Weight:  67.4 kg    Height:        Constitutional: NAD, non-verbal HEENT: conjunctivae and lids normal, EOMI CV: No cyanosis.   RESP: trach present Extremities: contractures   Data Reviewed:      Latest Ref Rng & Units 01/11/2024    2:58 AM 01/05/2024    4:12 AM 12/28/2023  6:16 AM  CBC  WBC 4.0 - 10.5 K/uL 4.5  5.0  5.9   Hemoglobin 13.0 - 17.0 g/dL 40.9  81.1  91.4   Hematocrit 39.0 - 52.0 % 37.6  33.4  34.5   Platelets 150 - 400 K/uL 245  318  338       Latest Ref Rng & Units 01/11/2024    2:58 AM 01/05/2024    4:12 AM 12/31/2023    5:42 AM  BMP  Glucose 70 - 99 mg/dL 91  86  782   BUN 6 - 20 mg/dL 24  17  24    Creatinine 0.61 - 1.24 mg/dL 9.56  2.13  0.86   Sodium 135 - 145 mmol/L 142  138  142   Potassium 3.5 - 5.1 mmol/L 4.1  4.2  4.2   Chloride 98 - 111 mmol/L 102  105  105   CO2 22 - 32 mmol/L 27  26  27    Calcium 8.9 - 10.3 mg/dL 9.5  8.8  9.2    No results found.   Family Communication:  Disposition: Status is: Inpatient Remains inpatient appropriate because: severity of illness, trach patient unable to go to prior SNF  Planned Discharge Destination: Skilled nursing facility     Time spent: 25 minutes  Author: Darlin Priestly, MD 01/19/2024 6:57 PM Secure chat 7am to 7pm For on call review [email or web address].
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
## 2024-01-20 DIAGNOSIS — I959 Hypotension, unspecified: Secondary | ICD-10-CM | POA: Diagnosis not present

## 2024-01-20 NOTE — Progress Notes (Signed)
 Progress Note   Patient: Alex [name] NWG:[id number] DOB: 1996-06-03 DOA: 11/19/2023     62 DOS: the patient was seen and examined on 01/20/2024   Brief hospital course: 28 yo M presenting to [facility] ED from outpatient rehab on 11/19/23 for evaluation of altered mental status.   History obtained per chart review and mother's telephone interview, patient unable to participate in interview due to respiratory distress and baseline TBI. 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 He was treated at [facility] for 5 months then discharged to rehab. Per his mother and legal guardian his baseline is: Non verbal but he will yell out sporadic nonsensical words with left sided paralysis. He is able to move his RUE in order to feed himself with finger foods and he has some involuntary movement with that arm, he will swat at you. Similar baseline function with RLE, he can kick and move, but often will lay it bent and to the side. He has enough strength to even try and get out of bed on the right side. She denies any issues with swallowing, but eats mostly soft food. If he is not watched closely with eating he will try to eat all the food at once. The facility staff reported he was at his normal baseline until lunchtime on 11/19/23. It was observed he was more somnolent and not interactive. Staff noted a cough and slightly increased work of breathing. Mom also confirmed noting a congested cough the last time she visited earlier in the week. Staff and mom denied nausea/ vomiting, but mom reports chronic loose stools.   EMS reported the patient febrile and tachycardic on arrival.   ED course: Upon arrival patient tachycardic and lethargic. Sepsis protocol initiated with antibiotics and IVF resuscitation. Labs significant for mild hypokalemia, otherwise WNL. Initial imaging unremarkable but then patient became hypoxic with SpO2 85% on RA and a CTa was obtained, negative for PE but concerning for  aspiration pneumonia.  11/20/23: Admit to ICU due to acute hypoxic respiratory failure requiring urgent intubation and mechanical ventilatory support secondary to suspected aspiration and pneumonia 11/21/23- patient moving RUE, mother at bedside we reviewed medical plan. He remains on 29mcg/kg/hr levophed, today plan to rescusitate more aggresively and wean from levophed and potentially extubate post SBP.   He is febrile this am.  He is on zithromax, unasyn 11/22/23- s/p bronch yesterday with aspiration of mucus plugging.  Today resp status improved with liberation protocol in proces. SLP post extubation today. 11/23/23- patient is +for pseudomonas resp cultures.  He is also MRSA pcr +, refined therapy during rounds with pharmacist. He remains on MV 11/24/23- patient is still on vasopressor support weaning down on MV.  Secretions are slightly better.  11/25/23- patient weaned off levophed, failed SBT with tachypnea, tachycardia.  Secretions much improved. 11/26/23- on minimal vent support, unable to perform SBT due to copious secretions from ETT.   11/27/23- on minimal vent support, secretions improved.  Change Doxycycline to Vancomycin.  Plan for SBT as tolerated. 11/28/23-Pt successfully extubated 02/4, currently tolerating HHFNC @35L /35%.  Required low dose precedex overnight to allow suctioning due to excessive secretions. Pt with suspected seizure activity developed tremors in the right upper extremity and became minimally responsive.  Received 1g of iv keppra.   11/29/23-Overnight pt developed sinus tachycardia/svt 140 to 160's improved following 2.5 mg iv metoprolol.  Tolerating RA with no signs of respiratory distress.  Transferring to progressive care unit TRH to pick  on 02/7 12/01/23: Pt transferred back to ICU with severe acute hypoxic respiratory failure initially placed on HHFNC but due to significant hypoxia O2 sats in the 60's he required reintubation and underwent emergent bronchoscopy which revealed thick  secretions in the LUL and LLL resulting in mucous plugging. Therapeutic aspiration performed. CXR showed collapse of the LUL secondary to mucus plugging  12/03/23: Pt remains mechanically intubated on minimal vent settings.  ENT consulted for tracheostomy placement due to recurrent respiratory failure due to inability to clear secretions.  Requiring levophed gtt to maintain map 65 or higher  12/04/23: No acute events overnight on minimal settings pending tracheostomy placement  12/05/23: No acute events overnight, on minimal vent support. Initially on low dose Levophed, now weaned off.  IR placed PEG tube. Pending Tracheostomy placement on Friday. 12/06/23: No acute events overnight, on minimal vent support. Awaiting Tracheostomy placement tomorrow. 12/07/23: No acute events overnight.  Tracheostomy placed this morning by ENT, no events with procedure. Will keep sedated today with newly placed Trach with plan for WUA/SBT tomorrow. 12/08/23: All sedation turned off for WUA will perform SBT or TCT as tolerated.  Pt spike at temp 100.9 F, hypotensive requiring levophed gtt, and tachycardic concerning for possible sepsis.  Blood culture/tracheal aspirate and UA sent.  Restarted broad spectrum abx (vancomycin/zosyn) 12/09/23: Pt no longer requiring levophed gtt.  Tmax 102.4 F 12/10/23: Central Line Removed, not requiring pressors or sedation. Tolearting TC. 12/11/23: Overnight was started back on Gabapentin and scheduled Propanol for possible neurostorming with significant improvement in HR from the 170's to mid 100's 12/12/23: HR remains stable in low 100's, not requiring vasopressors, on minimal vent support, SBT as tolerated.  Complete course of ABX as suspect he is colonized with Pseudomonas and MRSA. 12/13/23: Overnight with concern for possible tube feeds in ETT tubing. Propanol was decreased due to soft BP. CXR this morning without new infiltrate, no leukocytosis or fever, and bowel pattern normal on KUB, will  restart tube feeds. Remains on minimal vent support, execise in PSV as tolerated ~ unable to tolerate less than 10/5 in PSV. 12/14/23: No significant events overnight.  Afebrile, hemodynamically stable.  On minimal vent support, continue with SBT/TCT as tolerated. 12/15/23: No change in patient's condition.  Remained on full vent support overnight.  Now on pressure support and tolerating. 12/16/23: Tolerated trach collar trials 12/17/23- patient awaiting placement.  He may have had another seizure and we will order EEG for him today.  He may not leave due to insurance qualification with trache.  12/18/23- Patient with no acute events overnight. Labs including cbc and bmp reviewed with no significant new findings.  12/19/2023.  Transferred to medical service.  Will transfer out of ICU.  Currently on trach collar 28% around 6 L flow.  Saturating well.  Started on Cardizem to control heart rate 2/27.  Increase midodrine to 15 mg 3 times daily.  Added metoprolol to control heart rate 2/28.  Titrating metoprolol to try to control heart rate. 2/25 to 3/4 TOC trying to work on placement. 3/5 patient had mucous plugging of the left mainstem bronchus 3/6 through 3/18.  Patient to go back to his facility but likely not the end of the month. 3/13.  Speech pathology cleared to go on ice chips and will do a modified swallow evaluation in the future. 3/19-3/25/25: Pt has remained medically stable this week. Speech will try to do MBSS this week providing pt will cooperate.  01/16/24- discussed with aunt and grand mom  at bedside, may not go to SNF until 3 weeks per TOC. 01/17/24- sleeping comfortably. No family at bedside, 01/18/2024-no events reported by nursing overnight, patient is calm.   Assessment and Plan: * Hypotension Midodrine 15 mg 3 times daily to maintain blood pressure Blood pressure fairly stable  Acute hypoxic respiratory failure (HCC)  S/p trach on 12/07/23. Continue w/ trach collar. ENT follow up  appreciated. Difficulty tolerating PMV. Trach changed to size 6.  Tachycardia Continue Cardizem and metoprolol.  Septic shock (HCC) Resolved completed antibiotics.  On midodrine for blood pressure.    Acute metabolic encephalopathy In the setting of TBI.Marland Kitchen Patient able to move his right arm at baseline. Continue supportive care.  Aspiration pneumonia (HCC) Completed antibiotics.  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  Completed course of Zyvox and Zosyn.  Hypokalemia Replaced  Seizure disorder (HCC) Patient on valproic acid, Vimpat and gabapentin.  EEG did not show any seizure activity.  Anemia, unspecified Stable Hb.  No active bleeding. Last hemoglobin 12.1  01/11/24  History of traumatic brain injury Patient is bed bound. SNF resident.  Reactive thrombocytosis Last platelet count in normal range  Pressure injury of skin Present on admission.  See full description below. ,  Nutrition Documentation    Flowsheet Row ED to Hosp-Admission (Current) from 11/19/2023 in [facility] (1A)  Nutrition Problem Inadequate oral intake  Etiology inability to eat  [pt sedated and ventilated]  Nutrition Goal Patient will meet greater than or equal to 90% of their needs  Interventions Tube feeding, Prostat, Juven     ,  Active Pressure Injury/Wound(s)     Pressure Ulcer  Duration          Pressure Injury 11/20/23 Buttocks Left Stage 1 -  Intact skin with non-blanchable redness of a localized area usually over a bony prominence. 56 days   Pressure Injury 11/20/23 Foot Right;Medial;Distal Stage 1 -  Intact skin with non-blanchable redness of a localized area usually over a bony prominence. 56 days           (Optional):26781} Nursing supportive care. Fall, aspiration precautions. Diet:  Diet Orders (From admission, onward)     Start     Ordered   01/03/24 1126  Diet NPO time specified Except for: Ice Chips  Diet effective  midnight       Comments: For tracheostomy placement on 02/14  Question:  Except for  Answer:  Ice Chips   01/03/24 1125           DVT prophylaxis: enoxaparin (LOVENOX) injection 40 mg Start: 12/09/23 2200 SCDs Start: 11/20/23 0140  Level of care: med-surg   Code Status: Full Code  Subjective:  No new event.   Physical Exam: Vitals:   01/20/24 0443 01/20/24 0500 01/20/24 0907 01/20/24 1537  BP: 118/71  107/76 (!) 155/55  Pulse: 96  84 84  Resp: 18  16 16   Temp: 98 F (36.7 C)  97.6 F (36.4 C) 97.8 F (36.6 C)  TempSrc: Oral  Oral Oral  SpO2: 100%  100% 98%  Weight:  68.3 kg    Height:        Constitutional: NAD, awake, nonverbal HEENT: conjunctivae and lids normal, EOMI CV: No cyanosis.   RESP: trach present Extremities: contractures   Data Reviewed:      Latest Ref Rng & Units 01/11/2024    2:58 AM 01/05/2024    4:12 AM 12/28/2023    6:16 AM  CBC  WBC 4.0 -  10.5 K/uL 4.5  5.0  5.9   Hemoglobin 13.0 - 17.0 g/dL 16.1  09.6  04.5   Hematocrit 39.0 - 52.0 % 37.6  33.4  34.5   Platelets 150 - 400 K/uL 245  318  338       Latest Ref Rng & Units 01/11/2024    2:58 AM 01/05/2024    4:12 AM 12/31/2023    5:42 AM  BMP  Glucose 70 - 99 mg/dL 91  86  409   BUN 6 - 20 mg/dL 24  17  24    Creatinine 0.61 - 1.24 mg/dL 8.11  9.14  7.82   Sodium 135 - 145 mmol/L 142  138  142   Potassium 3.5 - 5.1 mmol/L 4.1  4.2  4.2   Chloride 98 - 111 mmol/L 102  105  105   CO2 22 - 32 mmol/L 27  26  27    Calcium 8.9 - 10.3 mg/dL 9.5  8.8  9.2    No results found.   Family Communication:  Disposition: Status is: Inpatient Remains inpatient appropriate because: severity of illness, trach patient unable to go to prior SNF  Planned Discharge Destination: Skilled nursing facility     Time spent: 25 minutes  Author: Darlin Priestly, MD 01/20/2024 5:39 PM Secure chat 7am to 7pm For on call review [email or web address].

## 2024-01-21 DIAGNOSIS — I959 Hypotension, unspecified: Secondary | ICD-10-CM | POA: Diagnosis not present

## 2024-01-21 NOTE — Progress Notes (Signed)
 Progress Note   Patient: Alex [name] ZOX:[id number] DOB: 03/08/1996 DOA: 11/19/2023     63 DOS: the patient was seen and examined on 01/21/2024   Brief hospital course: 28 yo M presenting to [facility] ED from outpatient rehab on 11/19/23 for evaluation of altered mental status.   History obtained per chart review and mother's telephone interview, patient unable to participate in interview due to respiratory distress and baseline TBI. 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 He was treated at [facility] for 5 months then discharged to rehab. Per his mother and legal guardian his baseline is: Non verbal but he will yell out sporadic nonsensical words with left sided paralysis. He is able to move his RUE in order to feed himself with finger foods and he has some involuntary movement with that arm, he will swat at you. Similar baseline function with RLE, he can kick and move, but often will lay it bent and to the side. He has enough strength to even try and get out of bed on the right side. She denies any issues with swallowing, but eats mostly soft food. If he is not watched closely with eating he will try to eat all the food at once. The facility staff reported he was at his normal baseline until lunchtime on 11/19/23. It was observed he was more somnolent and not interactive. Staff noted a cough and slightly increased work of breathing. Mom also confirmed noting a congested cough the last time she visited earlier in the week. Staff and mom denied nausea/ vomiting, but mom reports chronic loose stools.   EMS reported the patient febrile and tachycardic on arrival.   ED course: Upon arrival patient tachycardic and lethargic. Sepsis protocol initiated with antibiotics and IVF resuscitation. Labs significant for mild hypokalemia, otherwise WNL. Initial imaging unremarkable but then patient became hypoxic with SpO2 85% on RA and a CTa was obtained, negative for PE but concerning for  aspiration pneumonia.  11/20/23: Admit to ICU due to acute hypoxic respiratory failure requiring urgent intubation and mechanical ventilatory support secondary to suspected aspiration and pneumonia 11/21/23- patient moving RUE, mother at bedside we reviewed medical plan. He remains on 98mcg/kg/hr levophed, today plan to rescusitate more aggresively and wean from levophed and potentially extubate post SBP.   He is febrile this am.  He is on zithromax, unasyn 11/22/23- s/p bronch yesterday with aspiration of mucus plugging.  Today resp status improved with liberation protocol in proces. SLP post extubation today. 11/23/23- patient is +for pseudomonas resp cultures.  He is also MRSA pcr +, refined therapy during rounds with pharmacist. He remains on MV 11/24/23- patient is still on vasopressor support weaning down on MV.  Secretions are slightly better.  11/25/23- patient weaned off levophed, failed SBT with tachypnea, tachycardia.  Secretions much improved. 11/26/23- on minimal vent support, unable to perform SBT due to copious secretions from ETT.   11/27/23- on minimal vent support, secretions improved.  Change Doxycycline to Vancomycin.  Plan for SBT as tolerated. 11/28/23-Pt successfully extubated 02/4, currently tolerating HHFNC @35L /35%.  Required low dose precedex overnight to allow suctioning due to excessive secretions. Pt with suspected seizure activity developed tremors in the right upper extremity and became minimally responsive.  Received 1g of iv keppra.   11/29/23-Overnight pt developed sinus tachycardia/svt 140 to 160's improved following 2.5 mg iv metoprolol.  Tolerating RA with no signs of respiratory distress.  Transferring to progressive care unit TRH to pick  on 02/7 12/01/23: Pt transferred back to ICU with severe acute hypoxic respiratory failure initially placed on HHFNC but due to significant hypoxia O2 sats in the 60's he required reintubation and underwent emergent bronchoscopy which revealed thick  secretions in the LUL and LLL resulting in mucous plugging. Therapeutic aspiration performed. CXR showed collapse of the LUL secondary to mucus plugging  12/03/23: Pt remains mechanically intubated on minimal vent settings.  ENT consulted for tracheostomy placement due to recurrent respiratory failure due to inability to clear secretions.  Requiring levophed gtt to maintain map 65 or higher  12/04/23: No acute events overnight on minimal settings pending tracheostomy placement  12/05/23: No acute events overnight, on minimal vent support. Initially on low dose Levophed, now weaned off.  IR placed PEG tube. Pending Tracheostomy placement on Friday. 12/06/23: No acute events overnight, on minimal vent support. Awaiting Tracheostomy placement tomorrow. 12/07/23: No acute events overnight.  Tracheostomy placed this morning by ENT, no events with procedure. Will keep sedated today with newly placed Trach with plan for WUA/SBT tomorrow. 12/08/23: All sedation turned off for WUA will perform SBT or TCT as tolerated.  Pt spike at temp 100.9 F, hypotensive requiring levophed gtt, and tachycardic concerning for possible sepsis.  Blood culture/tracheal aspirate and UA sent.  Restarted broad spectrum abx (vancomycin/zosyn) 12/09/23: Pt no longer requiring levophed gtt.  Tmax 102.4 F 12/10/23: Central Line Removed, not requiring pressors or sedation. Tolearting TC. 12/11/23: Overnight was started back on Gabapentin and scheduled Propanol for possible neurostorming with significant improvement in HR from the 170's to mid 100's 12/12/23: HR remains stable in low 100's, not requiring vasopressors, on minimal vent support, SBT as tolerated.  Complete course of ABX as suspect he is colonized with Pseudomonas and MRSA. 12/13/23: Overnight with concern for possible tube feeds in ETT tubing. Propanol was decreased due to soft BP. CXR this morning without new infiltrate, no leukocytosis or fever, and bowel pattern normal on KUB, will  restart tube feeds. Remains on minimal vent support, execise in PSV as tolerated ~ unable to tolerate less than 10/5 in PSV. 12/14/23: No significant events overnight.  Afebrile, hemodynamically stable.  On minimal vent support, continue with SBT/TCT as tolerated. 12/15/23: No change in patient's condition.  Remained on full vent support overnight.  Now on pressure support and tolerating. 12/16/23: Tolerated trach collar trials 12/17/23- patient awaiting placement.  He may have had another seizure and we will order EEG for him today.  He may not leave due to insurance qualification with trache.  12/18/23- Patient with no acute events overnight. Labs including cbc and bmp reviewed with no significant new findings.  12/19/2023.  Transferred to medical service.  Will transfer out of ICU.  Currently on trach collar 28% around 6 L flow.  Saturating well.  Started on Cardizem to control heart rate 2/27.  Increase midodrine to 15 mg 3 times daily.  Added metoprolol to control heart rate 2/28.  Titrating metoprolol to try to control heart rate. 2/25 to 3/4 TOC trying to work on placement. 3/5 patient had mucous plugging of the left mainstem bronchus 3/6 through 3/18.  Patient to go back to his facility but likely not the end of the month. 3/13.  Speech pathology cleared to go on ice chips and will do a modified swallow evaluation in the future. 3/19-3/25/25: Pt has remained medically stable this week. Speech will try to do MBSS this week providing pt will cooperate.  01/16/24- discussed with aunt and grand mom  at bedside, may not go to SNF until 3 weeks per TOC. 01/17/24- sleeping comfortably. No family at bedside, 01/18/2024-no events reported by nursing overnight, patient is calm.   Assessment and Plan: * Hypotension Midodrine 15 mg 3 times daily to maintain blood pressure Blood pressure fairly stable  Acute hypoxic respiratory failure (HCC)  S/p trach on 12/07/23. Continue w/ trach collar. ENT follow up  appreciated. Difficulty tolerating PMV. Trach changed to size 6.  Tachycardia Continue Cardizem and metoprolol.  Septic shock (HCC) Resolved completed antibiotics.  On midodrine for blood pressure.    Acute metabolic encephalopathy In the setting of TBI.Marland Kitchen Patient able to move his right arm at baseline. Continue supportive care.  Aspiration pneumonia (HCC) Completed antibiotics.  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  Completed course of Zyvox and Zosyn.  Hypokalemia Replaced  Seizure disorder (HCC) Patient on valproic acid, Vimpat and gabapentin.  EEG did not show any seizure activity.  Anemia, unspecified Stable Hb.  No active bleeding. Last hemoglobin 12.1  01/11/24  History of traumatic brain injury Patient is bed bound. SNF resident.  Reactive thrombocytosis Last platelet count in normal range  Pressure injury of skin Present on admission.  See full description below. ,  Nutrition Documentation    Flowsheet Row ED to Hosp-Admission (Current) from 11/19/2023 in [facility] (1A)  Nutrition Problem Inadequate oral intake  Etiology inability to eat  [pt sedated and ventilated]  Nutrition Goal Patient will meet greater than or equal to 90% of their needs  Interventions Tube feeding, Prostat, Juven     ,  Active Pressure Injury/Wound(s)     Pressure Ulcer  Duration          Pressure Injury 11/20/23 Buttocks Left Stage 1 -  Intact skin with non-blanchable redness of a localized area usually over a bony prominence. 56 days   Pressure Injury 11/20/23 Foot Right;Medial;Distal Stage 1 -  Intact skin with non-blanchable redness of a localized area usually over a bony prominence. 56 days           (Optional):26781} Nursing supportive care. Fall, aspiration precautions. Diet:  Diet Orders (From admission, onward)     Start     Ordered   01/03/24 1126  Diet NPO time specified Except for: Ice Chips  Diet effective  midnight       Comments: For tracheostomy placement on 02/14  Question:  Except for  Answer:  Ice Chips   01/03/24 1125           DVT prophylaxis: enoxaparin (LOVENOX) injection 40 mg Start: 12/09/23 2200 SCDs Start: 11/20/23 0140  Level of care: med-surg   Code Status: Full Code  Subjective:  No new event.   Physical Exam: Vitals:   01/21/24 0409 01/21/24 0428 01/21/24 0525 01/21/24 0800  BP:  (!) 123/49  116/74  Pulse:  97  96  Resp:  18  16  Temp:  97.9 F (36.6 C)  100 F (37.8 C)  TempSrc:  Oral    SpO2:  100% 100% 100%  Weight: 67.9 kg     Height:        Constitutional: NAD, awake, non-verbal HEENT: conjunctivae and lids normal, EOMI CV: No cyanosis.   RESP: trach on 5L Extremities: contractures   Data Reviewed:      Latest Ref Rng & Units 01/11/2024    2:58 AM 01/05/2024    4:12 AM 12/28/2023    6:16 AM  CBC  WBC 4.0 - 10.5  K/uL 4.5  5.0  5.9   Hemoglobin 13.0 - 17.0 g/dL 16.1  09.6  04.5   Hematocrit 39.0 - 52.0 % 37.6  33.4  34.5   Platelets 150 - 400 K/uL 245  318  338       Latest Ref Rng & Units 01/11/2024    2:58 AM 01/05/2024    4:12 AM 12/31/2023    5:42 AM  BMP  Glucose 70 - 99 mg/dL 91  86  409   BUN 6 - 20 mg/dL 24  17  24    Creatinine 0.61 - 1.24 mg/dL 8.11  9.14  7.82   Sodium 135 - 145 mmol/L 142  138  142   Potassium 3.5 - 5.1 mmol/L 4.1  4.2  4.2   Chloride 98 - 111 mmol/L 102  105  105   CO2 22 - 32 mmol/L 27  26  27    Calcium 8.9 - 10.3 mg/dL 9.5  8.8  9.2    No results found.   Family Communication:  Disposition: Status is: Inpatient Remains inpatient appropriate because: severity of illness, trach patient unable to go to prior SNF  Planned Discharge Destination: Skilled nursing facility     Time spent: 25 minutes  Author: Darlin Priestly, MD 01/21/2024 6:45 PM Secure chat 7am to 7pm For on call review [email or web address].

## 2024-01-22 DIAGNOSIS — I959 Hypotension, unspecified: Secondary | ICD-10-CM | POA: Diagnosis not present

## 2024-01-22 NOTE — Progress Notes (Signed)
 Progress Note   Patient: Alex [name] ZOX:[id number] DOB: [birth year] DOA: 11/19/2023     64 DOS: the patient was seen and examined on 01/22/2024   Brief hospital course: 28 yo M presenting to [facility] ED from outpatient rehab on 11/19/23 for evaluation of altered mental status.   History obtained per chart review and mother's telephone interview, patient unable to participate in interview due to respiratory distress and baseline TBI. 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 He was treated at [facility] for 5 months then discharged to rehab. Per his mother and legal guardian his baseline is: Non verbal but he will yell out sporadic nonsensical words with left sided paralysis. He is able to move his RUE in order to feed himself with finger foods and he has some involuntary movement with that arm, he will swat at you. Similar baseline function with RLE, he can kick and move, but often will lay it bent and to the side. He has enough strength to even try and get out of bed on the right side. She denies any issues with swallowing, but eats mostly soft food. If he is not watched closely with eating he will try to eat all the food at once. The facility staff reported he was at his normal baseline until lunchtime on 11/19/23. It was observed he was more somnolent and not interactive. Staff noted a cough and slightly increased work of breathing. Mom also confirmed noting a congested cough the last time she visited earlier in the week. Staff and mom denied nausea/ vomiting, but mom reports chronic loose stools.   EMS reported the patient febrile and tachycardic on arrival.   ED course: Upon arrival patient tachycardic and lethargic. Sepsis protocol initiated with antibiotics and IVF resuscitation. Labs significant for mild hypokalemia, otherwise WNL. Initial imaging unremarkable but then patient became hypoxic with SpO2 85% on RA and a CTa was obtained, negative for PE but concerning for  aspiration pneumonia.  11/20/23: Admit to ICU due to acute hypoxic respiratory failure requiring urgent intubation and mechanical ventilatory support secondary to suspected aspiration and pneumonia 11/21/23- patient moving RUE, mother at bedside we reviewed medical plan. He remains on 62mcg/kg/hr levophed, today plan to rescusitate more aggresively and wean from levophed and potentially extubate post SBP.   He is febrile this am.  He is on zithromax, unasyn 11/22/23- s/p bronch yesterday with aspiration of mucus plugging.  Today resp status improved with liberation protocol in proces. SLP post extubation today. 11/23/23- patient is +for pseudomonas resp cultures.  He is also MRSA pcr +, refined therapy during rounds with pharmacist. He remains on MV 11/24/23- patient is still on vasopressor support weaning down on MV.  Secretions are slightly better.  11/25/23- patient weaned off levophed, failed SBT with tachypnea, tachycardia.  Secretions much improved. 11/26/23- on minimal vent support, unable to perform SBT due to copious secretions from ETT.   11/27/23- on minimal vent support, secretions improved.  Change Doxycycline to Vancomycin.  Plan for SBT as tolerated. 11/28/23-Pt successfully extubated 02/4, currently tolerating HHFNC @35L /35%.  Required low dose precedex overnight to allow suctioning due to excessive secretions. Pt with suspected seizure activity developed tremors in the right upper extremity and became minimally responsive.  Received 1g of iv keppra.   11/29/23-Overnight pt developed sinus tachycardia/svt 140 to 160's improved following 2.5 mg iv metoprolol.  Tolerating RA with no signs of respiratory distress.  Transferring to progressive care unit TRH to pick  on 02/7 12/01/23: Pt transferred back to ICU with severe acute hypoxic respiratory failure initially placed on HHFNC but due to significant hypoxia O2 sats in the 60's he required reintubation and underwent emergent bronchoscopy which revealed thick  secretions in the LUL and LLL resulting in mucous plugging. Therapeutic aspiration performed. CXR showed collapse of the LUL secondary to mucus plugging  12/03/23: Pt remains mechanically intubated on minimal vent settings.  ENT consulted for tracheostomy placement due to recurrent respiratory failure due to inability to clear secretions.  Requiring levophed gtt to maintain map 65 or higher  12/04/23: No acute events overnight on minimal settings pending tracheostomy placement  12/05/23: No acute events overnight, on minimal vent support. Initially on low dose Levophed, now weaned off.  IR placed PEG tube. Pending Tracheostomy placement on Friday. 12/06/23: No acute events overnight, on minimal vent support. Awaiting Tracheostomy placement tomorrow. 12/07/23: No acute events overnight.  Tracheostomy placed this morning by ENT, no events with procedure. Will keep sedated today with newly placed Trach with plan for WUA/SBT tomorrow. 12/08/23: All sedation turned off for WUA will perform SBT or TCT as tolerated.  Pt spike at temp 100.9 F, hypotensive requiring levophed gtt, and tachycardic concerning for possible sepsis.  Blood culture/tracheal aspirate and UA sent.  Restarted broad spectrum abx (vancomycin/zosyn) 12/09/23: Pt no longer requiring levophed gtt.  Tmax 102.4 F 12/10/23: Central Line Removed, not requiring pressors or sedation. Tolearting TC. 12/11/23: Overnight was started back on Gabapentin and scheduled Propanol for possible neurostorming with significant improvement in HR from the 170's to mid 100's 12/12/23: HR remains stable in low 100's, not requiring vasopressors, on minimal vent support, SBT as tolerated.  Complete course of ABX as suspect he is colonized with Pseudomonas and MRSA. 12/13/23: Overnight with concern for possible tube feeds in ETT tubing. Propanol was decreased due to soft BP. CXR this morning without new infiltrate, no leukocytosis or fever, and bowel pattern normal on KUB, will  restart tube feeds. Remains on minimal vent support, execise in PSV as tolerated ~ unable to tolerate less than 10/5 in PSV. 12/14/23: No significant events overnight.  Afebrile, hemodynamically stable.  On minimal vent support, continue with SBT/TCT as tolerated. 12/15/23: No change in patient's condition.  Remained on full vent support overnight.  Now on pressure support and tolerating. 12/16/23: Tolerated trach collar trials 12/17/23- patient awaiting placement.  He may have had another seizure and we will order EEG for him today.  He may not leave due to insurance qualification with trache.  12/18/23- Patient with no acute events overnight. Labs including cbc and bmp reviewed with no significant new findings.  12/19/2023.  Transferred to medical service.  Will transfer out of ICU.  Currently on trach collar 28% around 6 L flow.  Saturating well.  Started on Cardizem to control heart rate 2/27.  Increase midodrine to 15 mg 3 times daily.  Added metoprolol to control heart rate 2/28.  Titrating metoprolol to try to control heart rate. 2/25 to 3/4 TOC trying to work on placement. 3/5 patient had mucous plugging of the left mainstem bronchus 3/6 through 3/18.  Patient to go back to his facility but likely not the end of the month. 3/13.  Speech pathology cleared to go on ice chips and will do a modified swallow evaluation in the future. 3/19-3/25/25: Pt has remained medically stable this week. Speech will try to do MBSS this week providing pt will cooperate.  01/16/24- discussed with aunt and grand mom  at bedside, may not go to SNF until 3 weeks per TOC. 01/17/24- sleeping comfortably. No family at bedside, 01/18/2024-no events reported by nursing overnight, patient is calm.   Assessment and Plan: * Hypotension Midodrine 15 mg 3 times daily to maintain blood pressure Blood pressure fairly stable  Acute hypoxic respiratory failure (HCC)  S/p trach on 12/07/23. Continue w/ trach collar. ENT follow up  appreciated. Difficulty tolerating PMV. Trach changed to size 6.  Tachycardia Continue Cardizem and metoprolol.  Septic shock (HCC) Resolved completed antibiotics.  On midodrine for blood pressure.    Acute metabolic encephalopathy In the setting of TBI.Marland Kitchen Patient able to move his right arm at baseline. Continue supportive care.  Aspiration pneumonia (HCC) Completed antibiotics.  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  Completed course of Zyvox and Zosyn.  Hypokalemia Replaced  Seizure disorder (HCC) Patient on valproic acid, Vimpat and gabapentin.  EEG did not show any seizure activity.  Anemia, unspecified Stable Hb.  No active bleeding. Last hemoglobin 12.1  01/11/24  History of traumatic brain injury Patient is bed bound. SNF resident.  Reactive thrombocytosis Last platelet count in normal range  Pressure injury of skin Present on admission.  See full description below. ,  Nutrition Documentation    Flowsheet Row ED to Hosp-Admission (Current) from 11/19/2023 in [facility] (1A)  Nutrition Problem Inadequate oral intake  Etiology inability to eat  [pt sedated and ventilated]  Nutrition Goal Patient will meet greater than or equal to 90% of their needs  Interventions Tube feeding, Prostat, Juven     ,  Active Pressure Injury/Wound(s)     Pressure Ulcer  Duration          Pressure Injury 11/20/23 Buttocks Left Stage 1 -  Intact skin with non-blanchable redness of a localized area usually over a bony prominence. 56 days   Pressure Injury 11/20/23 Foot Right;Medial;Distal Stage 1 -  Intact skin with non-blanchable redness of a localized area usually over a bony prominence. 56 days           (Optional):26781} Nursing supportive care. Fall, aspiration precautions. Diet:  Diet Orders (From admission, onward)     Start     Ordered   01/03/24 1126  Diet NPO time specified Except for: Ice Chips  Diet effective  midnight       Comments: For tracheostomy placement on 02/14  Question:  Except for  Answer:  Ice Chips   01/03/24 1125           DVT prophylaxis: enoxaparin (LOVENOX) injection 40 mg Start: 12/09/23 2200 SCDs Start: 11/20/23 0140  Level of care: med-surg   Code Status: Full Code  Subjective:  SLP eval today, no change in rec, still rec NPO.   Physical Exam: Vitals:   01/22/24 0521 01/22/24 0739 01/22/24 0754 01/22/24 1536  BP: 135/87 (!) 114/98 (!) 112/98 113/79  Pulse:  96  (!) 101  Resp:  16  16  Temp:  99.3 F (37.4 C)  98 F (36.7 C)  TempSrc:  Oral  Oral  SpO2:  100%  97%  Weight:      Height:        Constitutional: NAD, awake, non-verbal HEENT: conjunctivae and lids normal, EOMI CV: No cyanosis.   RESP: trach connected to 4L Extremities: contractures   Data Reviewed:      Latest Ref Rng & Units 01/11/2024    2:58 AM 01/05/2024    4:12 AM 12/28/2023  6:16 AM  CBC  WBC 4.0 - 10.5 K/uL 4.5  5.0  5.9   Hemoglobin 13.0 - 17.0 g/dL 98.1  19.1  47.8   Hematocrit 39.0 - 52.0 % 37.6  33.4  34.5   Platelets 150 - 400 K/uL 245  318  338       Latest Ref Rng & Units 01/11/2024    2:58 AM 01/05/2024    4:12 AM 12/31/2023    5:42 AM  BMP  Glucose 70 - 99 mg/dL 91  86  295   BUN 6 - 20 mg/dL 24  17  24    Creatinine 0.61 - 1.24 mg/dL 6.21  3.08  6.57   Sodium 135 - 145 mmol/L 142  138  142   Potassium 3.5 - 5.1 mmol/L 4.1  4.2  4.2   Chloride 98 - 111 mmol/L 102  105  105   CO2 22 - 32 mmol/L 27  26  27    Calcium 8.9 - 10.3 mg/dL 9.5  8.8  9.2    No results found.   Family Communication:  Disposition: Status is: Inpatient Remains inpatient appropriate because: severity of illness, trach patient unable to go to prior SNF  Planned Discharge Destination: Skilled nursing facility     Time spent: 25 minutes  Author: Darlin Priestly, MD 01/22/2024 7:22 PM Secure chat 7am to 7pm For on call review [email or web address].
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
## 2024-01-22 NOTE — Plan of Care (Signed)
  Problem: Clinical Measurements: Goal: Diagnostic test results will improve Outcome: Progressing   Problem: Nutrition: Goal: Adequate nutrition will be maintained Outcome: Progressing   Problem: Pain Managment: Goal: General experience of comfort will improve and/or be controlled Outcome: Progressing

## 2024-01-23 DIAGNOSIS — J9601 Acute respiratory failure with hypoxia: Secondary | ICD-10-CM | POA: Diagnosis not present

## 2024-01-23 DIAGNOSIS — I959 Hypotension, unspecified: Secondary | ICD-10-CM | POA: Diagnosis not present

## 2024-01-23 DIAGNOSIS — A419 Sepsis, unspecified organism: Secondary | ICD-10-CM | POA: Diagnosis not present

## 2024-01-23 DIAGNOSIS — G9341 Metabolic encephalopathy: Secondary | ICD-10-CM | POA: Diagnosis not present

## 2024-01-23 NOTE — TOC Progression Note (Signed)
 Transition of Care [facility]) - Progression Note    Patient Details  Name: Alex [name] MRN: [id number] Date of Birth: 1996/09/08  Transition of Care [facility] CM/SW Contact  Margarito Liner, LCSW Phone Number: 01/23/2024, 1:22 PM  Clinical Narrative:  [facility] is still planning to accept patient back once trach training is complete. Admissions coordinator will follow up with CSW once she has a date for the training.   Expected Discharge Plan: Skilled Nursing Facility Barriers to Discharge: Continued Medical Work up  Expected Discharge Plan and Services       Living arrangements for the past 2 months: Skilled Nursing Facility                                       Social Determinants of Health (SDOH) Interventions SDOH Screenings   Food Insecurity: Patient Unable To Answer (11/20/2023)  Housing: Patient Unable To Answer (11/20/2023)  Transportation Needs: Patient Unable To Answer (11/20/2023)  Utilities: Patient Unable To Answer (11/20/2023)  Financial Resource Strain: Low Risk  (12/02/2021)   Received from [facility]  Tobacco Use: Low Risk  (11/19/2023)    Readmission Risk Interventions     No data to display

## 2024-01-23 NOTE — NC FL2 (Signed)
 [place] MEDICAID FL2 LEVEL OF CARE FORM     IDENTIFICATION  Patient Name: Alex [name] Birthdate: 1996-06-14 Sex: male Admission Date (Current Location): 11/19/2023  [place] and [place] Number:  Chiropodist and Address:  [facility], [address], [place], [place] [address]      Provider Number: [id number]  Attending Physician Name and Address:  Arnetha Courser, MD  Relative Name and Phone Number:       Current Level of Care: Hospital Recommended Level of Care: Skilled Nursing Facility Prior Approval Number:    Date Approved/Denied:   PASRR Number: [id number]  Discharge Plan: SNF    Current Diagnoses: Patient Active Problem List   Diagnosis Date Noted   Anemia, unspecified 01/08/2024   History of traumatic brain injury 12/20/2023   Hypotension 12/19/2023   Tachycardia 12/19/2023   Reactive thrombocytosis 11/30/2023   Pressure injury of skin 11/26/2023   Acute hypoxic respiratory failure (HCC) 11/20/2023   Seizure disorder (HCC) 11/20/2023   Septic shock (HCC) 11/20/2023   Acute metabolic encephalopathy 11/20/2023   Hypokalemia 11/20/2023   Aspiration pneumonia (HCC) 11/19/2023   Fever 04/20/2022   Acute respiratory failure with hypoxia (HCC) 04/12/2022   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   Seizure (HCC) 09/11/2021   Status epilepticus (HCC) 05/31/2021   Endotracheally intubated 05/31/2021   Traumatic brain injury (HCC) 05/31/2021   Malnutrition of moderate degree 05/31/2021    Orientation RESPIRATION BLADDER Height & Weight      (Unable to assess)  Tracheostomy (5 L at 28%. Trach placed on 2/14.) Incontinent, External catheter Weight: 125 lb 3.5 oz (56.8 kg) Height:  5' 9.02" (175.3 cm)  BEHAVIORAL SYMPTOMS/MOOD NEUROLOGICAL BOWEL NUTRITION STATUS  Other (Comment) (Irritable)  (Seizure disorder, TBI) Incontinent Feeding tube  AMBULATORY STATUS COMMUNICATION OF NEEDS Skin     Non-Verbally Skin abrasions, Bruising, Other  (Comment), PU Stage and Appropriate Care, Surgical wounds (Blister, erythema/redness, rash, scratch marks. Incision on throat: Gauze prn. MASD on scrotum: Desitin.) PU Stage 1 Dressing:  (Left buttocks: Foam prn. Right foot: Foam prn.)                     Personal Care Assistance Level of Assistance              Functional Limitations Info  Sight, Hearing, Speech Sight Info: Adequate Hearing Info: Adequate Speech Info: Impaired (Per RN assessment, you can occasionally understand what he says.)    SPECIAL CARE FACTORS FREQUENCY                       Contractures Contractures Info: Present    Additional Factors Info  Code Status, Allergies Code Status Info: Full code Allergies Info: NKDA          Current Medications (01/23/2024):  This is the current hospital active medication list Current Facility-Administered Medications  Medication Dose Route Frequency Provider Last Rate Last Admin   acetaminophen (TYLENOL) tablet 650 mg  650 mg Per Tube Q6H PRN Rust-Chester, Cecelia Byars, NP   650 mg at 01/23/24 0865   Or   acetaminophen (TYLENOL) suppository 650 mg  650 mg Rectal Q6H PRN Rust-Chester, Cecelia Byars, NP   650 mg at 11/21/23 0230   baclofen (LIORESAL) tablet 10 mg  10 mg Per Tube TID Rust-Chester, Micheline Rough L, NP   10 mg at 01/23/24 1036   bisacodyl (DULCOLAX) suppository 10 mg  10 mg Rectal Daily PRN Janann Colonel, MD  10 mg at 12/06/23 1054   diltiazem (CARDIZEM) tablet 30 mg  30 mg Per Tube Q6H Barrie Folk, RPH   30 mg at 01/23/24 0553   enoxaparin (LOVENOX) injection 40 mg  40 mg Subcutaneous Q24H Ezequiel Essex, NP   40 mg at 01/22/24 2053   feeding supplement (OSMOLITE 1.5 CAL) liquid 1,000 mL  1,000 mL Per Tube Continuous Assaker, West Bali, MD 60 mL/hr at 01/23/24 0400 Infusion Verify at 01/23/24 0400   feeding supplement (PROSource TF20) liquid 60 mL  60 mL Per Tube Daily Raechel Chute, MD   60 mL at 01/23/24 1037   fiber supplement (BANATROL  TF) liquid 60 mL  60 mL Per Tube BID Esaw Grandchild A, DO   60 mL at 01/23/24 1038   free water 30 mL  30 mL Per Tube Q4H Effie Shy, RPH   30 mL at 01/23/24 0955   gabapentin (NEURONTIN) 250 MG/5ML solution 300 mg  300 mg Per Tube Q8H Dorothea Ogle B, RPH   300 mg at 01/23/24 0553   glycopyrrolate (ROBINUL) injection 0.2 mg  0.2 mg Intravenous QID PRN Erin Fulling, MD   0.2 mg at 12/19/23 1205   lacosamide (VIMPAT) tablet 100 mg  100 mg Per Tube BID Lowella Bandy, RPH   100 mg at 01/23/24 1036   levalbuterol (XOPENEX) nebulizer solution 0.63 mg  0.63 mg Nebulization Q6H PRN Erin Fulling, MD       lip balm (BLISTEX) ointment   Topical PRN Jimmye Norman, NP   Given at 01/17/24 1249   liver oil-zinc oxide (DESITIN) 40 % ointment   Topical Q6H PRN Jimmye Norman, NP   Given at 01/19/24 2217   loperamide (IMODIUM) capsule 2 mg  2 mg Oral Q8H PRN Alford Highland, MD   2 mg at 01/23/24 0553   metoprolol tartrate (LOPRESSOR) injection 5 mg  5 mg Intravenous Q1H PRN Alford Highland, MD   5 mg at 12/26/23 0617   metoprolol tartrate (LOPRESSOR) tablet 25 mg  25 mg Per Tube BID Alford Highland, MD   25 mg at 01/23/24 1036   midodrine (PROAMATINE) tablet 15 mg  15 mg Per Tube TID WC Alford Highland, MD   15 mg at 01/23/24 1000   morphine (PF) 2 MG/ML injection 2 mg  2 mg Intravenous Q4H PRN Loyce Dys, MD   2 mg at 12/22/23 1839   nutrition supplement (JUVEN) (JUVEN) powder packet 1 packet  1 packet Per Tube BID BM Raechel Chute, MD   1 packet at 01/23/24 1038   omeprazole (KONVOMEP) 2 mg/mL oral suspension 20 mg  20 mg Per Tube BID Alford Highland, MD   20 mg at 01/22/24 2053   ondansetron (ZOFRAN) tablet 4 mg  4 mg Per Tube Q6H PRN Rust-Chester, Cecelia Byars, NP       Or   ondansetron (ZOFRAN) injection 4 mg  4 mg Intravenous Q6H PRN Rust-Chester, Cecelia Byars, NP       Oral care mouth rinse  15 mL Mouth Rinse PRN Assaker, West Bali, MD       Oral care mouth rinse  15 mL  Mouth Rinse PRN Manuela Schwartz, NP       oxyCODONE (Oxy IR/ROXICODONE) immediate release tablet 5 mg  5 mg Per Tube Q6H PRN Marcelino Duster, MD   5 mg at 01/22/24 0154   QUEtiapine (SEROQUEL) tablet 50 mg  50 mg Per Tube TID Erin Fulling, MD   50 mg  at 01/23/24 1036   valproic acid (DEPAKENE) 250 MG/5ML solution 500 mg  500 mg Per Tube Daily Lowella Bandy, RPH   500 mg at 01/23/24 1037   valproic acid (DEPAKENE) 250 MG/5ML solution 750 mg  750 mg Per Tube QHS Lowella Bandy, RPH   750 mg at 01/22/24 2053     Discharge Medications: Please see discharge summary for a list of discharge medications.  Relevant Imaging Results:  Relevant Lab Results:   Additional Information SS#: [id number]  Margarito Liner, LCSW
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
## 2024-01-23 NOTE — Progress Notes (Signed)
 Progress Note   Patient: Alex [name] KGM:[id number] DOB: Dec 04, 1995 DOA: 11/19/2023     65 DOS: the patient was seen and examined on 01/23/2024   Brief hospital course: 28 yo M presenting to [facility] ED from outpatient rehab on 11/19/23 for evaluation of altered mental status.   History obtained per chart review and mother's telephone interview, patient unable to participate in interview due to respiratory distress and baseline TBI. 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 He was treated at [facility] for 5 months then discharged to rehab. Per his mother and legal guardian his baseline is: Non verbal but he will yell out sporadic nonsensical words with left sided paralysis. He is able to move his RUE in order to feed himself with finger foods and he has some involuntary movement with that arm, he will swat at you. Similar baseline function with RLE, he can kick and move, but often will lay it bent and to the side. He has enough strength to even try and get out of bed on the right side. She denies any issues with swallowing, but eats mostly soft food. If he is not watched closely with eating he will try to eat all the food at once. The facility staff reported he was at his normal baseline until lunchtime on 11/19/23. It was observed he was more somnolent and not interactive. Staff noted a cough and slightly increased work of breathing. Mom also confirmed noting a congested cough the last time she visited earlier in the week. Staff and mom denied nausea/ vomiting, but mom reports chronic loose stools.   EMS reported the patient febrile and tachycardic on arrival.   ED course: Upon arrival patient tachycardic and lethargic. Sepsis protocol initiated with antibiotics and IVF resuscitation. Labs significant for mild hypokalemia, otherwise WNL. Initial imaging unremarkable but then patient became hypoxic with SpO2 85% on RA and a CTa was obtained, negative for PE but concerning for  aspiration pneumonia.  11/20/23: Admit to ICU due to acute hypoxic respiratory failure requiring urgent intubation and mechanical ventilatory support secondary to suspected aspiration and pneumonia 11/21/23- patient moving RUE, mother at bedside we reviewed medical plan. He remains on 32mcg/kg/hr levophed, today plan to rescusitate more aggresively and wean from levophed and potentially extubate post SBP.   He is febrile this am.  He is on zithromax, unasyn 11/22/23- s/p bronch yesterday with aspiration of mucus plugging.  Today resp status improved with liberation protocol in proces. SLP post extubation today. 11/23/23- patient is +for pseudomonas resp cultures.  He is also MRSA pcr +, refined therapy during rounds with pharmacist. He remains on MV 11/24/23- patient is still on vasopressor support weaning down on MV.  Secretions are slightly better.  11/25/23- patient weaned off levophed, failed SBT with tachypnea, tachycardia.  Secretions much improved. 11/26/23- on minimal vent support, unable to perform SBT due to copious secretions from ETT.   11/27/23- on minimal vent support, secretions improved.  Change Doxycycline to Vancomycin.  Plan for SBT as tolerated. 11/28/23-Pt successfully extubated 02/4, currently tolerating HHFNC @35L /35%.  Required low dose precedex overnight to allow suctioning due to excessive secretions. Pt with suspected seizure activity developed tremors in the right upper extremity and became minimally responsive.  Received 1g of iv keppra.   11/29/23-Overnight pt developed sinus tachycardia/svt 140 to 160's improved following 2.5 mg iv metoprolol.  Tolerating RA with no signs of respiratory distress.  Transferring to progressive care unit TRH to pick  on 02/7 12/01/23: Pt transferred back to ICU with severe acute hypoxic respiratory failure initially placed on HHFNC but due to significant hypoxia O2 sats in the 60's he required reintubation and underwent emergent bronchoscopy which revealed thick  secretions in the LUL and LLL resulting in mucous plugging. Therapeutic aspiration performed. CXR showed collapse of the LUL secondary to mucus plugging  12/03/23: Pt remains mechanically intubated on minimal vent settings.  ENT consulted for tracheostomy placement due to recurrent respiratory failure due to inability to clear secretions.  Requiring levophed gtt to maintain map 65 or higher  12/04/23: No acute events overnight on minimal settings pending tracheostomy placement  12/05/23: No acute events overnight, on minimal vent support. Initially on low dose Levophed, now weaned off.  IR placed PEG tube. Pending Tracheostomy placement on Friday. 12/06/23: No acute events overnight, on minimal vent support. Awaiting Tracheostomy placement tomorrow. 12/07/23: No acute events overnight.  Tracheostomy placed this morning by ENT, no events with procedure. Will keep sedated today with newly placed Trach with plan for WUA/SBT tomorrow. 12/08/23: All sedation turned off for WUA will perform SBT or TCT as tolerated.  Pt spike at temp 100.9 F, hypotensive requiring levophed gtt, and tachycardic concerning for possible sepsis.  Blood culture/tracheal aspirate and UA sent.  Restarted broad spectrum abx (vancomycin/zosyn) 12/09/23: Pt no longer requiring levophed gtt.  Tmax 102.4 F 12/10/23: Central Line Removed, not requiring pressors or sedation. Tolearting TC. 12/11/23: Overnight was started back on Gabapentin and scheduled Propanol for possible neurostorming with significant improvement in HR from the 170's to mid 100's 12/12/23: HR remains stable in low 100's, not requiring vasopressors, on minimal vent support, SBT as tolerated.  Complete course of ABX as suspect he is colonized with Pseudomonas and MRSA. 12/13/23: Overnight with concern for possible tube feeds in ETT tubing. Propanol was decreased due to soft BP. CXR this morning without new infiltrate, no leukocytosis or fever, and bowel pattern normal on KUB, will  restart tube feeds. Remains on minimal vent support, execise in PSV as tolerated ~ unable to tolerate less than 10/5 in PSV. 12/14/23: No significant events overnight.  Afebrile, hemodynamically stable.  On minimal vent support, continue with SBT/TCT as tolerated. 12/15/23: No change in patient's condition.  Remained on full vent support overnight.  Now on pressure support and tolerating. 12/16/23: Tolerated trach collar trials 12/17/23- patient awaiting placement.  He may have had another seizure and we will order EEG for him today.  He may not leave due to insurance qualification with trache.  12/18/23- Patient with no acute events overnight. Labs including cbc and bmp reviewed with no significant new findings.  12/19/2023.  Transferred to medical service.  Will transfer out of ICU.  Currently on trach collar 28% around 6 L flow.  Saturating well.  Started on Cardizem to control heart rate 2/27.  Increase midodrine to 15 mg 3 times daily.  Added metoprolol to control heart rate 2/28.  Titrating metoprolol to try to control heart rate. 2/25 to 3/4 TOC trying to work on placement. 3/5 patient had mucous plugging of the left mainstem bronchus 3/6 through 3/18.  Patient to go back to his facility but likely not the end of the month. 3/13.  Speech pathology cleared to go on ice chips and will do a modified swallow evaluation in the future. 3/19-3/25/25: Pt has remained medically stable this week. Speech will try to do MBSS this week providing pt will cooperate.  01/16/24- discussed with aunt and grand mom  at bedside, may not go to SNF until 3 weeks per TOC. 01/17/24- sleeping comfortably. No family at bedside, 01/18/2024-no events reported by nursing overnight, patient is calm. 4/2: Hemodynamically stable, need to complete trach training before returning back to his facility.   Assessment and Plan: * Hypotension Midodrine 15 mg 3 times daily to maintain blood pressure Blood pressure fairly stable  Acute  hypoxic respiratory failure (HCC)  S/p trach on 12/07/23. Continue w/ trach collar. ENT follow up appreciated. Difficulty tolerating PMV. Trach changed to size 6.  Tachycardia Continue Cardizem and metoprolol.  Septic shock (HCC) Resolved completed antibiotics.  On midodrine for blood pressure.    Acute metabolic encephalopathy In the setting of TBI.Marland Kitchen Patient able to move his right arm at baseline. Continue supportive care.  Aspiration pneumonia (HCC) Completed antibiotics.  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  Completed course of Zyvox and Zosyn.  Hypokalemia Replaced  Seizure disorder (HCC) Patient on valproic acid, Vimpat and gabapentin.  EEG did not show any seizure activity.  Anemia, unspecified Stable Hb.  No active bleeding. Last hemoglobin 12.1  01/11/24  History of traumatic brain injury Patient is bed bound. SNF resident.  Reactive thrombocytosis Last platelet count in normal range  Pressure injury of skin Present on admission.  See full description below. ,  Nutrition Documentation    Flowsheet Row ED to Hosp-Admission (Current) from 11/19/2023 in [facility] (1A)  Nutrition Problem Inadequate oral intake  Etiology inability to eat  [pt sedated and ventilated]  Nutrition Goal Patient will meet greater than or equal to 90% of their needs  Interventions Tube feeding, Prostat, Juven     ,  Active Pressure Injury/Wound(s)     Pressure Ulcer  Duration          Pressure Injury 11/20/23 Buttocks Left Stage 1 -  Intact skin with non-blanchable redness of a localized area usually over a bony prominence. 56 days   Pressure Injury 11/20/23 Foot Right;Medial;Distal Stage 1 -  Intact skin with non-blanchable redness of a localized area usually over a bony prominence. 56 days           (Optional):26781} Nursing supportive care. Fall, aspiration precautions. Diet:  Diet Orders (From admission, onward)     Start      Ordered   01/03/24 1126  Diet NPO time specified Except for: Ice Chips  Diet effective midnight       Comments: For tracheostomy placement on 02/14  Question:  Except for  Answer:  Ice Chips   01/03/24 1125           DVT prophylaxis: enoxaparin (LOVENOX) injection 40 mg Start: 12/09/23 2200 SCDs Start: 11/20/23 0140  Level of care: med-surg   Code Status: Full Code  Subjective:  Patient was seen and examined today.  No new concern.  Physical Exam: Vitals:   01/23/24 0500 01/23/24 0550 01/23/24 0805 01/23/24 1458  BP:  115/76 114/75 (!) 122/90  Pulse:  (!) 105 91 (!) 110  Resp:  18 18 18   Temp:  99.1 F (37.3 C) 98.8 F (37.1 C) 99.1 F (37.3 C)  TempSrc:      SpO2:  99%  99%  Weight: 56.8 kg     Height:       General.  Cognitively impaired, nonverbal gentleman, in no acute distress.  Trach collar in place. Pulmonary.  Lungs clear bilaterally, normal respiratory effort. CV.  Regular rate and rhythm, no JVD, rub or murmur. Abdomen.  Soft, nontender, nondistended, BS positive. CNS.  Awake, no new deficits Extremities.  No edema, no cyanosis, pulses intact and symmetrical.  Data Reviewed:      Latest Ref Rng & Units 01/11/2024    2:58 AM 01/05/2024    4:12 AM 12/28/2023    6:16 AM  CBC  WBC 4.0 - 10.5 K/uL 4.5  5.0  5.9   Hemoglobin 13.0 - 17.0 g/dL 16.1  09.6  04.5   Hematocrit 39.0 - 52.0 % 37.6  33.4  34.5   Platelets 150 - 400 K/uL 245  318  338       Latest Ref Rng & Units 01/11/2024    2:58 AM 01/05/2024    4:12 AM 12/31/2023    5:42 AM  BMP  Glucose 70 - 99 mg/dL 91  86  409   BUN 6 - 20 mg/dL 24  17  24    Creatinine 0.61 - 1.24 mg/dL 8.11  9.14  7.82   Sodium 135 - 145 mmol/L 142  138  142   Potassium 3.5 - 5.1 mmol/L 4.1  4.2  4.2   Chloride 98 - 111 mmol/L 102  105  105   CO2 22 - 32 mmol/L 27  26  27    Calcium 8.9 - 10.3 mg/dL 9.5  8.8  9.2    No results found.   Family Communication:  Disposition: Status is: Inpatient Remains inpatient  appropriate because: severity of illness, trach patient unable to go to prior SNF  Planned Discharge Destination: Skilled nursing facility  DVT prophylaxis.  Lovenox Time spent: 40 minutes  Author: Arnetha Courser, MD 01/23/2024 4:37 PM Secure chat 7am to 7pm For on call review [email or web address].

## 2024-01-24 DIAGNOSIS — G9341 Metabolic encephalopathy: Secondary | ICD-10-CM | POA: Diagnosis not present

## 2024-01-24 DIAGNOSIS — J9601 Acute respiratory failure with hypoxia: Secondary | ICD-10-CM | POA: Diagnosis not present

## 2024-01-24 DIAGNOSIS — A419 Sepsis, unspecified organism: Secondary | ICD-10-CM | POA: Diagnosis not present

## 2024-01-24 DIAGNOSIS — I959 Hypotension, unspecified: Secondary | ICD-10-CM | POA: Diagnosis not present

## 2024-01-24 NOTE — Progress Notes (Addendum)
 Progress Note   Patient: Alex [name] EXB:[id number] DOB: 03-19-1996 DOA: 11/19/2023     66 DOS: the patient was seen and examined on 01/24/2024   Brief hospital course: 28 yo M presenting to [facility] ED from outpatient rehab on 11/19/23 for evaluation of altered mental status.   History obtained per chart review and mother's telephone interview, patient unable to participate in interview due to respiratory distress and baseline TBI. 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 He was treated at [facility] for 5 months then discharged to rehab. Per his mother and legal guardian his baseline is: Non verbal but he will yell out sporadic nonsensical words with left sided paralysis. He is able to move his RUE in order to feed himself with finger foods and he has some involuntary movement with that arm, he will swat at you. Similar baseline function with RLE, he can kick and move, but often will lay it bent and to the side. He has enough strength to even try and get out of bed on the right side. She denies any issues with swallowing, but eats mostly soft food. If he is not watched closely with eating he will try to eat all the food at once. The facility staff reported he was at his normal baseline until lunchtime on 11/19/23. It was observed he was more somnolent and not interactive. Staff noted a cough and slightly increased work of breathing. Mom also confirmed noting a congested cough the last time she visited earlier in the week. Staff and mom denied nausea/ vomiting, but mom reports chronic loose stools.   EMS reported the patient febrile and tachycardic on arrival.   ED course: Upon arrival patient tachycardic and lethargic. Sepsis protocol initiated with antibiotics and IVF resuscitation. Labs significant for mild hypokalemia, otherwise WNL. Initial imaging unremarkable but then patient became hypoxic with SpO2 85% on RA and a CTa was obtained, negative for PE but concerning for  aspiration pneumonia.  11/20/23: Admit to ICU due to acute hypoxic respiratory failure requiring urgent intubation and mechanical ventilatory support secondary to suspected aspiration and pneumonia 11/21/23- patient moving RUE, mother at bedside we reviewed medical plan. He remains on 42mcg/kg/hr levophed, today plan to rescusitate more aggresively and wean from levophed and potentially extubate post SBP.   He is febrile this am.  He is on zithromax, unasyn 11/22/23- s/p bronch yesterday with aspiration of mucus plugging.  Today resp status improved with liberation protocol in proces. SLP post extubation today. 11/23/23- patient is +for pseudomonas resp cultures.  He is also MRSA pcr +, refined therapy during rounds with pharmacist. He remains on MV 11/24/23- patient is still on vasopressor support weaning down on MV.  Secretions are slightly better.  11/25/23- patient weaned off levophed, failed SBT with tachypnea, tachycardia.  Secretions much improved. 11/26/23- on minimal vent support, unable to perform SBT due to copious secretions from ETT.   11/27/23- on minimal vent support, secretions improved.  Change Doxycycline to Vancomycin.  Plan for SBT as tolerated. 11/28/23-Pt successfully extubated 02/4, currently tolerating HHFNC @35L /35%.  Required low dose precedex overnight to allow suctioning due to excessive secretions. Pt with suspected seizure activity developed tremors in the right upper extremity and became minimally responsive.  Received 1g of iv keppra.   11/29/23-Overnight pt developed sinus tachycardia/svt 140 to 160's improved following 2.5 mg iv metoprolol.  Tolerating RA with no signs of respiratory distress.  Transferring to progressive care unit TRH to pick  on 02/7 12/01/23: Pt transferred back to ICU with severe acute hypoxic respiratory failure initially placed on HHFNC but due to significant hypoxia O2 sats in the 60's he required reintubation and underwent emergent bronchoscopy which revealed thick  secretions in the LUL and LLL resulting in mucous plugging. Therapeutic aspiration performed. CXR showed collapse of the LUL secondary to mucus plugging  12/03/23: Pt remains mechanically intubated on minimal vent settings.  ENT consulted for tracheostomy placement due to recurrent respiratory failure due to inability to clear secretions.  Requiring levophed gtt to maintain map 65 or higher  12/04/23: No acute events overnight on minimal settings pending tracheostomy placement  12/05/23: No acute events overnight, on minimal vent support. Initially on low dose Levophed, now weaned off.  IR placed PEG tube. Pending Tracheostomy placement on Friday. 12/06/23: No acute events overnight, on minimal vent support. Awaiting Tracheostomy placement tomorrow. 12/07/23: No acute events overnight.  Tracheostomy placed this morning by ENT, no events with procedure. Will keep sedated today with newly placed Trach with plan for WUA/SBT tomorrow. 12/08/23: All sedation turned off for WUA will perform SBT or TCT as tolerated.  Pt spike at temp 100.9 F, hypotensive requiring levophed gtt, and tachycardic concerning for possible sepsis.  Blood culture/tracheal aspirate and UA sent.  Restarted broad spectrum abx (vancomycin/zosyn) 12/09/23: Pt no longer requiring levophed gtt.  Tmax 102.4 F 12/10/23: Central Line Removed, not requiring pressors or sedation. Tolearting TC. 12/11/23: Overnight was started back on Gabapentin and scheduled Propanol for possible neurostorming with significant improvement in HR from the 170's to mid 100's 12/12/23: HR remains stable in low 100's, not requiring vasopressors, on minimal vent support, SBT as tolerated.  Complete course of ABX as suspect he is colonized with Pseudomonas and MRSA. 12/13/23: Overnight with concern for possible tube feeds in ETT tubing. Propanol was decreased due to soft BP. CXR this morning without new infiltrate, no leukocytosis or fever, and bowel pattern normal on KUB, will  restart tube feeds. Remains on minimal vent support, execise in PSV as tolerated ~ unable to tolerate less than 10/5 in PSV. 12/14/23: No significant events overnight.  Afebrile, hemodynamically stable.  On minimal vent support, continue with SBT/TCT as tolerated. 12/15/23: No change in patient's condition.  Remained on full vent support overnight.  Now on pressure support and tolerating. 12/16/23: Tolerated trach collar trials 12/17/23- patient awaiting placement.  He may have had another seizure and we will order EEG for him today.  He may not leave due to insurance qualification with trache.  12/18/23- Patient with no acute events overnight. Labs including cbc and bmp reviewed with no significant new findings.  12/19/2023.  Transferred to medical service.  Will transfer out of ICU.  Currently on trach collar 28% around 6 L flow.  Saturating well.  Started on Cardizem to control heart rate 2/27.  Increase midodrine to 15 mg 3 times daily.  Added metoprolol to control heart rate 2/28.  Titrating metoprolol to try to control heart rate. 2/25 to 3/4 TOC trying to work on placement. 3/5 patient had mucous plugging of the left mainstem bronchus 3/6 through 3/18.  Patient to go back to his facility but likely not the end of the month. 3/13.  Speech pathology cleared to go on ice chips and will do a modified swallow evaluation in the future. 3/19-3/25/25: Pt has remained medically stable this week. Speech will try to do MBSS this week providing pt will cooperate.  01/16/24- discussed with aunt and grand mom  at bedside, may not go to SNF until 3 weeks per TOC. 01/17/24- sleeping comfortably. No family at bedside, 01/18/2024-no events reported by nursing overnight, patient is calm. 4/2: Hemodynamically stable, need to complete trach training before returning back to his facility.   Assessment and Plan: * Hypotension Midodrine 15 mg 3 times daily to maintain blood pressure Blood pressure fairly stable  Acute  hypoxic respiratory failure (HCC)  S/p trach on 12/07/23. Continue w/ trach collar. ENT follow up appreciated. Difficulty tolerating PMV. Trach changed to size 6.  Tachycardia Continue Cardizem and metoprolol.  Septic shock (HCC) Resolved completed antibiotics.  On midodrine for blood pressure.    Acute metabolic encephalopathy In the setting of TBI.Marland Kitchen Patient able to move his right arm at baseline. Continue supportive care.  Aspiration pneumonia (HCC) Completed antibiotics.  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  Completed course of Zyvox and Zosyn.  Hypokalemia Replaced  Seizure disorder (HCC) Patient on valproic acid, Vimpat and gabapentin.  EEG did not show any seizure activity.  Anemia, unspecified Stable Hb.  No active bleeding. Last hemoglobin 12.1  01/11/24  History of traumatic brain injury Patient is bed bound. SNF resident.  Reactive thrombocytosis Last platelet count in normal range  Pressure injury of skin Present on admission.  See full description below. ,  Nutrition Documentation    Flowsheet Row ED to Hosp-Admission (Current) from 11/19/2023 in [facility] ([facility])  Nutrition Problem Inadequate oral intake  Etiology inability to eat  [pt sedated and ventilated]  Nutrition Goal Patient will meet greater than or equal to 90% of their needs  Interventions Tube feeding, Prostat, Juven     ,  Active Pressure Injury/Wound(s)     Pressure Ulcer  Duration          Pressure Injury 11/20/23 Buttocks Left Stage 1 -  Intact skin with non-blanchable redness of a localized area usually over a bony prominence. 56 days   Pressure Injury 11/20/23 Foot Right;Medial;Distal Stage 1 -  Intact skin with non-blanchable redness of a localized area usually over a bony prominence. 56 days           (Optional):26781} Nursing supportive care. Fall, aspiration precautions. Diet:  Diet Orders (From admission, onward)     Start      Ordered   01/03/24 1126  Diet NPO time specified Except for: Ice Chips  Diet effective midnight       Comments: For tracheostomy placement on 02/14  Question:  Except for  Answer:  Ice Chips   01/03/24 1125           DVT prophylaxis: enoxaparin (LOVENOX) injection 40 mg Start: 12/09/23 2200 SCDs Start: 11/20/23 0140  Level of care: med-surg   Code Status: Full Code  Subjective:  Patient with no new concern.  Awaiting completion of training for trach, should be done by the end of this month.  Physical Exam: Vitals:   01/24/24 0500 01/24/24 0827 01/24/24 1012 01/24/24 1134  BP:   116/71   Pulse:   97   Resp:   19   Temp:   98.7 F (37.1 C)   TempSrc:   Oral   SpO2: 95% 97% 100% 100%  Weight: 68.1 kg     Height:       General.  Cognitively impaired gentleman, in no acute distress.  Trach collar in place Pulmonary.  Lungs clear bilaterally, normal respiratory effort. CV.  Regular rate and rhythm, no JVD, rub or murmur. Abdomen.  Soft, nontender, nondistended, BS positive.  PEG tube in place CNS.  Awake, no neurodeficit. Extremities.  No edema, no cyanosis, pulses intact and symmetrical.  Data Reviewed:      Latest Ref Rng & Units 01/11/2024    2:58 AM 01/05/2024    4:12 AM 12/28/2023    6:16 AM  CBC  WBC 4.0 - 10.5 K/uL 4.5  5.0  5.9   Hemoglobin 13.0 - 17.0 g/dL 16.1  09.6  04.5   Hematocrit 39.0 - 52.0 % 37.6  33.4  34.5   Platelets 150 - 400 K/uL 245  318  338       Latest Ref Rng & Units 01/11/2024    2:58 AM 01/05/2024    4:12 AM 12/31/2023    5:42 AM  BMP  Glucose 70 - 99 mg/dL 91  86  409   BUN 6 - 20 mg/dL 24  17  24    Creatinine 0.61 - 1.24 mg/dL 8.11  9.14  7.82   Sodium 135 - 145 mmol/L 142  138  142   Potassium 3.5 - 5.1 mmol/L 4.1  4.2  4.2   Chloride 98 - 111 mmol/L 102  105  105   CO2 22 - 32 mmol/L 27  26  27    Calcium 8.9 - 10.3 mg/dL 9.5  8.8  9.2    No results found.   Family Communication: Tried calling mother with no  response Disposition: Status is: Inpatient Remains inpatient appropriate because: severity of illness, trach patient unable to go to prior SNF  Planned Discharge Destination: Skilled nursing facility  DVT prophylaxis.  Lovenox Time spent: 42 minutes  Author: Arnetha Courser, MD 01/24/2024 2:15 PM Secure chat 7am to 7pm For on call review [email or web address].

## 2024-01-25 DIAGNOSIS — A419 Sepsis, unspecified organism: Secondary | ICD-10-CM | POA: Diagnosis not present

## 2024-01-25 DIAGNOSIS — J9601 Acute respiratory failure with hypoxia: Secondary | ICD-10-CM | POA: Diagnosis not present

## 2024-01-25 DIAGNOSIS — G9341 Metabolic encephalopathy: Secondary | ICD-10-CM | POA: Diagnosis not present

## 2024-01-25 DIAGNOSIS — I959 Hypotension, unspecified: Secondary | ICD-10-CM | POA: Diagnosis not present

## 2024-01-25 NOTE — Plan of Care (Signed)
  Problem: Respiratory: Goal: Ability to maintain adequate ventilation will improve Outcome: Progressing   Problem: Activity: Goal: Ability to tolerate increased activity will improve Outcome: Progressing   Problem: Nutrition: Goal: Adequate nutrition will be maintained Outcome: Progressing

## 2024-01-25 NOTE — Plan of Care (Signed)
  Problem: Clinical Measurements: Goal: Diagnostic test results will improve Outcome: Progressing   Problem: Respiratory: Goal: Ability to maintain adequate ventilation will improve Outcome: Progressing   Problem: Skin Integrity: Goal: Risk for impaired skin integrity will decrease Outcome: Progressing   Problem: Respiratory: Goal: Ability to maintain a clear airway and adequate ventilation will improve Outcome: Progressing   Problem: Role Relationship: Goal: Method of communication will improve Outcome: Progressing

## 2024-01-25 NOTE — Progress Notes (Signed)
 Progress Note   Patient: Alex [name] EXB:[id number] DOB: October 28, 1995 DOA: 11/19/2023     67 DOS: the patient was seen and examined on 01/25/2024   Brief hospital course: 28 yo M presenting to [facility] ED from outpatient rehab on 11/19/23 for evaluation of altered mental status.   History obtained per chart review and mother's telephone interview, patient unable to participate in interview due to respiratory distress and baseline TBI. 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 He was treated at [facility] for 5 months then discharged to rehab. Per his mother and legal guardian his baseline is: Non verbal but he will yell out sporadic nonsensical words with left sided paralysis. He is able to move his RUE in order to feed himself with finger foods and he has some involuntary movement with that arm, he will swat at you. Similar baseline function with RLE, he can kick and move, but often will lay it bent and to the side. He has enough strength to even try and get out of bed on the right side. She denies any issues with swallowing, but eats mostly soft food. If he is not watched closely with eating he will try to eat all the food at once. The facility staff reported he was at his normal baseline until lunchtime on 11/19/23. It was observed he was more somnolent and not interactive. Staff noted a cough and slightly increased work of breathing. Mom also confirmed noting a congested cough the last time she visited earlier in the week. Staff and mom denied nausea/ vomiting, but mom reports chronic loose stools.   EMS reported the patient febrile and tachycardic on arrival.   ED course: Upon arrival patient tachycardic and lethargic. Sepsis protocol initiated with antibiotics and IVF resuscitation. Labs significant for mild hypokalemia, otherwise WNL. Initial imaging unremarkable but then patient became hypoxic with SpO2 85% on RA and a CTa was obtained, negative for PE but concerning for  aspiration pneumonia.  11/20/23: Admit to ICU due to acute hypoxic respiratory failure requiring urgent intubation and mechanical ventilatory support secondary to suspected aspiration and pneumonia 11/21/23- patient moving RUE, mother at bedside we reviewed medical plan. He remains on 38mcg/kg/hr levophed, today plan to rescusitate more aggresively and wean from levophed and potentially extubate post SBP.   He is febrile this am.  He is on zithromax, unasyn 11/22/23- s/p bronch yesterday with aspiration of mucus plugging.  Today resp status improved with liberation protocol in proces. SLP post extubation today. 11/23/23- patient is +for pseudomonas resp cultures.  He is also MRSA pcr +, refined therapy during rounds with pharmacist. He remains on MV 11/24/23- patient is still on vasopressor support weaning down on MV.  Secretions are slightly better.  11/25/23- patient weaned off levophed, failed SBT with tachypnea, tachycardia.  Secretions much improved. 11/26/23- on minimal vent support, unable to perform SBT due to copious secretions from ETT.   11/27/23- on minimal vent support, secretions improved.  Change Doxycycline to Vancomycin.  Plan for SBT as tolerated. 11/28/23-Pt successfully extubated 02/4, currently tolerating HHFNC @35L /35%.  Required low dose precedex overnight to allow suctioning due to excessive secretions. Pt with suspected seizure activity developed tremors in the right upper extremity and became minimally responsive.  Received 1g of iv keppra.   11/29/23-Overnight pt developed sinus tachycardia/svt 140 to 160's improved following 2.5 mg iv metoprolol.  Tolerating RA with no signs of respiratory distress.  Transferring to progressive care unit TRH to pick  on 02/7 12/01/23: Pt transferred back to ICU with severe acute hypoxic respiratory failure initially placed on HHFNC but due to significant hypoxia O2 sats in the 60's he required reintubation and underwent emergent bronchoscopy which revealed thick  secretions in the LUL and LLL resulting in mucous plugging. Therapeutic aspiration performed. CXR showed collapse of the LUL secondary to mucus plugging  12/03/23: Pt remains mechanically intubated on minimal vent settings.  ENT consulted for tracheostomy placement due to recurrent respiratory failure due to inability to clear secretions.  Requiring levophed gtt to maintain map 65 or higher  12/04/23: No acute events overnight on minimal settings pending tracheostomy placement  12/05/23: No acute events overnight, on minimal vent support. Initially on low dose Levophed, now weaned off.  IR placed PEG tube. Pending Tracheostomy placement on Friday. 12/06/23: No acute events overnight, on minimal vent support. Awaiting Tracheostomy placement tomorrow. 12/07/23: No acute events overnight.  Tracheostomy placed this morning by ENT, no events with procedure. Will keep sedated today with newly placed Trach with plan for WUA/SBT tomorrow. 12/08/23: All sedation turned off for WUA will perform SBT or TCT as tolerated.  Pt spike at temp 100.9 F, hypotensive requiring levophed gtt, and tachycardic concerning for possible sepsis.  Blood culture/tracheal aspirate and UA sent.  Restarted broad spectrum abx (vancomycin/zosyn) 12/09/23: Pt no longer requiring levophed gtt.  Tmax 102.4 F 12/10/23: Central Line Removed, not requiring pressors or sedation. Tolearting TC. 12/11/23: Overnight was started back on Gabapentin and scheduled Propanol for possible neurostorming with significant improvement in HR from the 170's to mid 100's 12/12/23: HR remains stable in low 100's, not requiring vasopressors, on minimal vent support, SBT as tolerated.  Complete course of ABX as suspect he is colonized with Pseudomonas and MRSA. 12/13/23: Overnight with concern for possible tube feeds in ETT tubing. Propanol was decreased due to soft BP. CXR this morning without new infiltrate, no leukocytosis or fever, and bowel pattern normal on KUB, will  restart tube feeds. Remains on minimal vent support, execise in PSV as tolerated ~ unable to tolerate less than 10/5 in PSV. 12/14/23: No significant events overnight.  Afebrile, hemodynamically stable.  On minimal vent support, continue with SBT/TCT as tolerated. 12/15/23: No change in patient's condition.  Remained on full vent support overnight.  Now on pressure support and tolerating. 12/16/23: Tolerated trach collar trials 12/17/23- patient awaiting placement.  He may have had another seizure and we will order EEG for him today.  He may not leave due to insurance qualification with trache.  12/18/23- Patient with no acute events overnight. Labs including cbc and bmp reviewed with no significant new findings.  12/19/2023.  Transferred to medical service.  Will transfer out of ICU.  Currently on trach collar 28% around 6 L flow.  Saturating well.  Started on Cardizem to control heart rate 2/27.  Increase midodrine to 15 mg 3 times daily.  Added metoprolol to control heart rate 2/28.  Titrating metoprolol to try to control heart rate. 2/25 to 3/4 TOC trying to work on placement. 3/5 patient had mucous plugging of the left mainstem bronchus 3/6 through 3/18.  Patient to go back to his facility but likely not the end of the month. 3/13.  Speech pathology cleared to go on ice chips and will do a modified swallow evaluation in the future. 3/19-3/25/25: Pt has remained medically stable this week. Speech will try to do MBSS this week providing pt will cooperate.  01/16/24- discussed with aunt and grand mom  at bedside, may not go to SNF until 3 weeks per TOC. 01/17/24- sleeping comfortably. No family at bedside, 01/18/2024-no events reported by nursing overnight, patient is calm. 4/2: Hemodynamically stable, need to complete trach training before returning back to his facility.   Assessment and Plan: * Hypotension Midodrine 15 mg 3 times daily to maintain blood pressure Blood pressure fairly stable  Acute  hypoxic respiratory failure (HCC)  S/p trach on 12/07/23. Continue w/ trach collar. ENT follow up appreciated. Difficulty tolerating PMV. Trach changed to size 6.  Tachycardia Continue Cardizem and metoprolol.  Septic shock (HCC) Resolved completed antibiotics.  On midodrine for blood pressure.    Acute metabolic encephalopathy In the setting of TBI.Marland Kitchen Patient able to move his right arm at baseline. Continue supportive care.  Aspiration pneumonia (HCC) Completed antibiotics.  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  Completed course of Zyvox and Zosyn.  Hypokalemia Replaced  Seizure disorder (HCC) Patient on valproic acid, Vimpat and gabapentin.  EEG did not show any seizure activity.  Anemia, unspecified Stable Hb.  No active bleeding. Last hemoglobin 12.1  01/11/24  History of traumatic brain injury Patient is bed bound. SNF resident.  Reactive thrombocytosis Last platelet count in normal range  Pressure injury of skin Present on admission.  See full description below. ,  Nutrition Documentation    Flowsheet Row ED to Hosp-Admission (Current) from 11/19/2023 in [facility] (1A)  Nutrition Problem Inadequate oral intake  Etiology inability to eat  [pt sedated and ventilated]  Nutrition Goal Patient will meet greater than or equal to 90% of their needs  Interventions Tube feeding, Prostat, Juven     ,  Active Pressure Injury/Wound(s)     Pressure Ulcer  Duration          Pressure Injury 11/20/23 Buttocks Left Stage 1 -  Intact skin with non-blanchable redness of a localized area usually over a bony prominence. 56 days   Pressure Injury 11/20/23 Foot Right;Medial;Distal Stage 1 -  Intact skin with non-blanchable redness of a localized area usually over a bony prominence. 56 days           (Optional):26781} Nursing supportive care. Fall, aspiration precautions. Diet:  Diet Orders (From admission, onward)     Start      Ordered   01/03/24 1126  Diet NPO time specified Except for: Ice Chips  Diet effective midnight       Comments: For tracheostomy placement on 02/14  Question:  Except for  Answer:  Ice Chips   01/03/24 1125           DVT prophylaxis: enoxaparin (LOVENOX) injection 40 mg Start: 12/09/23 2200 SCDs Start: 11/20/23 0140  Level of care: med-surg   Code Status: Full Code  Subjective:  Patient remained stable and he was awake with no new concern when seen today.  Physical Exam: Vitals:   01/25/24 0815 01/25/24 0947 01/25/24 1637 01/25/24 1643  BP:  (!) 147/85  136/86  Pulse: 97 (!) 105  93  Resp: 16 16 16 16   Temp:  97.9 F (36.6 C)  98.8 F (37.1 C)  TempSrc:      SpO2: 100%   99%  Weight:      Height:       General.  Cognitively impaired gentleman, in no acute distress.  Trach collar in place Pulmonary.  Lungs clear bilaterally, normal respiratory effort. CV.  Regular rate and rhythm, no JVD, rub or murmur. Abdomen.  Soft, nontender,  nondistended, BS positive.  PEG tube in place CNS.  Alert with no neurodeficit Extremities.  No edema, no cyanosis, pulses intact and symmetrical.  Data Reviewed:      Latest Ref Rng & Units 01/11/2024    2:58 AM 01/05/2024    4:12 AM 12/28/2023    6:16 AM  CBC  WBC 4.0 - 10.5 K/uL 4.5  5.0  5.9   Hemoglobin 13.0 - 17.0 g/dL 16.1  09.6  04.5   Hematocrit 39.0 - 52.0 % 37.6  33.4  34.5   Platelets 150 - 400 K/uL 245  318  338       Latest Ref Rng & Units 01/11/2024    2:58 AM 01/05/2024    4:12 AM 12/31/2023    5:42 AM  BMP  Glucose 70 - 99 mg/dL 91  86  409   BUN 6 - 20 mg/dL 24  17  24    Creatinine 0.61 - 1.24 mg/dL 8.11  9.14  7.82   Sodium 135 - 145 mmol/L 142  138  142   Potassium 3.5 - 5.1 mmol/L 4.1  4.2  4.2   Chloride 98 - 111 mmol/L 102  105  105   CO2 22 - 32 mmol/L 27  26  27    Calcium 8.9 - 10.3 mg/dL 9.5  8.8  9.2    No results found.   Family Communication:   Disposition: Status is: Inpatient Remains  inpatient appropriate because: severity of illness, trach patient unable to go to prior SNF  Planned Discharge Destination: Skilled nursing facility  DVT prophylaxis.  Lovenox Time spent: 40 minutes  Author: Arnetha Courser, MD 01/25/2024 6:15 PM Secure chat 7am to 7pm For on call review [email or web address].

## 2024-01-25 NOTE — TOC Progression Note (Signed)
 Transition of Care [facility]) - Progression Note    Patient Details  Name: Alex [name] MRN: [id number] Date of Birth: 1996-01-28  [facility] CM/SW Contact  Marlowe Sax, RN Phone Number: 01/25/2024, 10:34 AM  Clinical Narrative:    [facility] is still planning to accept patient back once trach training is complete    Expected Discharge Plan: Skilled Nursing Facility Barriers to Discharge: Continued Medical Work up  Expected Discharge Plan and Services       Living arrangements for the past 2 months: Skilled Nursing Facility                                       Social Determinants of Health (SDOH) Interventions SDOH Screenings   Food Insecurity: Patient Unable To Answer (11/20/2023)  Housing: Patient Unable To Answer (11/20/2023)  Transportation Needs: Patient Unable To Answer (11/20/2023)  Utilities: Patient Unable To Answer (11/20/2023)  Financial Resource Strain: Low Risk  (12/02/2021)   Received from [facility]  Tobacco Use: Low Risk  (11/19/2023)    Readmission Risk Interventions     No data to display

## 2024-01-26 DIAGNOSIS — J9601 Acute respiratory failure with hypoxia: Secondary | ICD-10-CM | POA: Diagnosis not present

## 2024-01-26 DIAGNOSIS — A419 Sepsis, unspecified organism: Secondary | ICD-10-CM | POA: Diagnosis not present

## 2024-01-26 DIAGNOSIS — I959 Hypotension, unspecified: Secondary | ICD-10-CM | POA: Diagnosis not present

## 2024-01-26 DIAGNOSIS — G9341 Metabolic encephalopathy: Secondary | ICD-10-CM | POA: Diagnosis not present

## 2024-01-26 NOTE — Progress Notes (Signed)
 Progress Note   Patient: Alex [name] ZOX:[id number] DOB: 03-Aug-1996 DOA: 11/19/2023     68 DOS: the patient was seen and examined on 01/26/2024   Brief hospital course: 28 yo M presenting to [facility] ED from outpatient rehab on 11/19/23 for evaluation of altered mental status.   History obtained per chart review and mother's telephone interview, patient unable to participate in interview due to respiratory distress and baseline TBI. 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 He was treated at [facility] for 5 months then discharged to rehab. Per his mother and legal guardian his baseline is: Non verbal but he will yell out sporadic nonsensical words with left sided paralysis. He is able to move his RUE in order to feed himself with finger foods and he has some involuntary movement with that arm, he will swat at you. Similar baseline function with RLE, he can kick and move, but often will lay it bent and to the side. He has enough strength to even try and get out of bed on the right side. She denies any issues with swallowing, but eats mostly soft food. If he is not watched closely with eating he will try to eat all the food at once. The facility staff reported he was at his normal baseline until lunchtime on 11/19/23. It was observed he was more somnolent and not interactive. Staff noted a cough and slightly increased work of breathing. Mom also confirmed noting a congested cough the last time she visited earlier in the week. Staff and mom denied nausea/ vomiting, but mom reports chronic loose stools.   EMS reported the patient febrile and tachycardic on arrival.   ED course: Upon arrival patient tachycardic and lethargic. Sepsis protocol initiated with antibiotics and IVF resuscitation. Labs significant for mild hypokalemia, otherwise WNL. Initial imaging unremarkable but then patient became hypoxic with SpO2 85% on RA and a CTa was obtained, negative for PE but concerning for  aspiration pneumonia.  11/20/23: Admit to ICU due to acute hypoxic respiratory failure requiring urgent intubation and mechanical ventilatory support secondary to suspected aspiration and pneumonia 11/21/23- patient moving RUE, mother at bedside we reviewed medical plan. He remains on 61mcg/kg/hr levophed, today plan to rescusitate more aggresively and wean from levophed and potentially extubate post SBP.   He is febrile this am.  He is on zithromax, unasyn 11/22/23- s/p bronch yesterday with aspiration of mucus plugging.  Today resp status improved with liberation protocol in proces. SLP post extubation today. 11/23/23- patient is +for pseudomonas resp cultures.  He is also MRSA pcr +, refined therapy during rounds with pharmacist. He remains on MV 11/24/23- patient is still on vasopressor support weaning down on MV.  Secretions are slightly better.  11/25/23- patient weaned off levophed, failed SBT with tachypnea, tachycardia.  Secretions much improved. 11/26/23- on minimal vent support, unable to perform SBT due to copious secretions from ETT.   11/27/23- on minimal vent support, secretions improved.  Change Doxycycline to Vancomycin.  Plan for SBT as tolerated. 11/28/23-Pt successfully extubated 02/4, currently tolerating HHFNC @35L /35%.  Required low dose precedex overnight to allow suctioning due to excessive secretions. Pt with suspected seizure activity developed tremors in the right upper extremity and became minimally responsive.  Received 1g of iv keppra.   11/29/23-Overnight pt developed sinus tachycardia/svt 140 to 160's improved following 2.5 mg iv metoprolol.  Tolerating RA with no signs of respiratory distress.  Transferring to progressive care unit TRH to pick  on 02/7 12/01/23: Pt transferred back to ICU with severe acute hypoxic respiratory failure initially placed on HHFNC but due to significant hypoxia O2 sats in the 60's he required reintubation and underwent emergent bronchoscopy which revealed thick  secretions in the LUL and LLL resulting in mucous plugging. Therapeutic aspiration performed. CXR showed collapse of the LUL secondary to mucus plugging  12/03/23: Pt remains mechanically intubated on minimal vent settings.  ENT consulted for tracheostomy placement due to recurrent respiratory failure due to inability to clear secretions.  Requiring levophed gtt to maintain map 65 or higher  12/04/23: No acute events overnight on minimal settings pending tracheostomy placement  12/05/23: No acute events overnight, on minimal vent support. Initially on low dose Levophed, now weaned off.  IR placed PEG tube. Pending Tracheostomy placement on Friday. 12/06/23: No acute events overnight, on minimal vent support. Awaiting Tracheostomy placement tomorrow. 12/07/23: No acute events overnight.  Tracheostomy placed this morning by ENT, no events with procedure. Will keep sedated today with newly placed Trach with plan for WUA/SBT tomorrow. 12/08/23: All sedation turned off for WUA will perform SBT or TCT as tolerated.  Pt spike at temp 100.9 F, hypotensive requiring levophed gtt, and tachycardic concerning for possible sepsis.  Blood culture/tracheal aspirate and UA sent.  Restarted broad spectrum abx (vancomycin/zosyn) 12/09/23: Pt no longer requiring levophed gtt.  Tmax 102.4 F 12/10/23: Central Line Removed, not requiring pressors or sedation. Tolearting TC. 12/11/23: Overnight was started back on Gabapentin and scheduled Propanol for possible neurostorming with significant improvement in HR from the 170's to mid 100's 12/12/23: HR remains stable in low 100's, not requiring vasopressors, on minimal vent support, SBT as tolerated.  Complete course of ABX as suspect he is colonized with Pseudomonas and MRSA. 12/13/23: Overnight with concern for possible tube feeds in ETT tubing. Propanol was decreased due to soft BP. CXR this morning without new infiltrate, no leukocytosis or fever, and bowel pattern normal on KUB, will  restart tube feeds. Remains on minimal vent support, execise in PSV as tolerated ~ unable to tolerate less than 10/5 in PSV. 12/14/23: No significant events overnight.  Afebrile, hemodynamically stable.  On minimal vent support, continue with SBT/TCT as tolerated. 12/15/23: No change in patient's condition.  Remained on full vent support overnight.  Now on pressure support and tolerating. 12/16/23: Tolerated trach collar trials 12/17/23- patient awaiting placement.  He may have had another seizure and we will order EEG for him today.  He may not leave due to insurance qualification with trache.  12/18/23- Patient with no acute events overnight. Labs including cbc and bmp reviewed with no significant new findings.  12/19/2023.  Transferred to medical service.  Will transfer out of ICU.  Currently on trach collar 28% around 6 L flow.  Saturating well.  Started on Cardizem to control heart rate 2/27.  Increase midodrine to 15 mg 3 times daily.  Added metoprolol to control heart rate 2/28.  Titrating metoprolol to try to control heart rate. 2/25 to 3/4 TOC trying to work on placement. 3/5 patient had mucous plugging of the left mainstem bronchus 3/6 through 3/18.  Patient to go back to his facility but likely not the end of the month. 3/13.  Speech pathology cleared to go on ice chips and will do a modified swallow evaluation in the future. 3/19-3/25/25: Pt has remained medically stable this week. Speech will try to do MBSS this week providing pt will cooperate.  01/16/24- discussed with aunt and grand mom  at bedside, may not go to SNF until 3 weeks per TOC. 01/17/24- sleeping comfortably. No family at bedside, 01/18/2024-no events reported by nursing overnight, patient is calm. 4/2: Hemodynamically stable, need to complete trach training before returning back to his facility.   Assessment and Plan: * Hypotension Midodrine 15 mg 3 times daily to maintain blood pressure Blood pressure fairly stable  Acute  hypoxic respiratory failure (HCC)  S/p trach on 12/07/23. Continue w/ trach collar. ENT follow up appreciated. Difficulty tolerating PMV. Trach changed to size 6.  Tachycardia Continue Cardizem and metoprolol.  Septic shock (HCC) Resolved completed antibiotics.  On midodrine for blood pressure.    Acute metabolic encephalopathy In the setting of TBI.Marland Kitchen Patient able to move his right arm at baseline. Continue supportive care.  Aspiration pneumonia (HCC) Completed antibiotics.  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  Completed course of Zyvox and Zosyn.  Hypokalemia Replaced  Seizure disorder (HCC) Patient on valproic acid, Vimpat and gabapentin.  EEG did not show any seizure activity.  Anemia, unspecified Stable Hb.  No active bleeding. Last hemoglobin 12.1  01/11/24  History of traumatic brain injury Patient is bed bound. SNF resident.  Reactive thrombocytosis Last platelet count in normal range  Pressure injury of skin Present on admission.  See full description below. ,  Nutrition Documentation    Flowsheet Row ED to Hosp-Admission (Current) from 11/19/2023 in [facility] (1A)  Nutrition Problem Inadequate oral intake  Etiology inability to eat  [pt sedated and ventilated]  Nutrition Goal Patient will meet greater than or equal to 90% of their needs  Interventions Tube feeding, Prostat, Juven     ,  Active Pressure Injury/Wound(s)     Pressure Ulcer  Duration          Pressure Injury 11/20/23 Buttocks Left Stage 1 -  Intact skin with non-blanchable redness of a localized area usually over a bony prominence. 56 days   Pressure Injury 11/20/23 Foot Right;Medial;Distal Stage 1 -  Intact skin with non-blanchable redness of a localized area usually over a bony prominence. 56 days           (Optional):26781} Nursing supportive care. Fall, aspiration precautions. Diet:  Diet Orders (From admission, onward)     Start      Ordered   01/03/24 1126  Diet NPO time specified Except for: Ice Chips  Diet effective midnight       Comments: For tracheostomy placement on 02/14  Question:  Except for  Answer:  Ice Chips   01/03/24 1125           DVT prophylaxis: enoxaparin (LOVENOX) injection 40 mg Start: 12/09/23 2200 SCDs Start: 11/20/23 0140  Level of care: med-surg   Code Status: Full Code  Subjective:  Patient was resting comfortably when seen today.  No new concern  Physical Exam: Vitals:   01/25/24 2338 01/26/24 0438 01/26/24 0500 01/26/24 0527  BP: 119/78 120/77  120/77  Pulse:  94    Resp:  18    Temp:  97.8 F (36.6 C)    TempSrc:      SpO2:  100%    Weight:   69 kg   Height:       General.  Cognitively impaired gentleman, in no acute distress.  Trach collar in place Pulmonary.  Lungs clear bilaterally, normal respiratory effort. CV.  Regular rate and rhythm, no JVD, rub or murmur. Abdomen.  Soft, nontender, nondistended, BS positive.  PEG tube in  place CNS.  Sleeping today Extremities.  No edema,   Data Reviewed:      Latest Ref Rng & Units 01/11/2024    2:58 AM 01/05/2024    4:12 AM 12/28/2023    6:16 AM  CBC  WBC 4.0 - 10.5 K/uL 4.5  5.0  5.9   Hemoglobin 13.0 - 17.0 g/dL 16.1  09.6  04.5   Hematocrit 39.0 - 52.0 % 37.6  33.4  34.5   Platelets 150 - 400 K/uL 245  318  338       Latest Ref Rng & Units 01/11/2024    2:58 AM 01/05/2024    4:12 AM 12/31/2023    5:42 AM  BMP  Glucose 70 - 99 mg/dL 91  86  409   BUN 6 - 20 mg/dL 24  17  24    Creatinine 0.61 - 1.24 mg/dL 8.11  9.14  7.82   Sodium 135 - 145 mmol/L 142  138  142   Potassium 3.5 - 5.1 mmol/L 4.1  4.2  4.2   Chloride 98 - 111 mmol/L 102  105  105   CO2 22 - 32 mmol/L 27  26  27    Calcium 8.9 - 10.3 mg/dL 9.5  8.8  9.2    No results found.   Family Communication:   Disposition: Status is: Inpatient Remains inpatient appropriate because: severity of illness, trach patient unable to go to prior SNF  Planned  Discharge Destination: Skilled nursing facility  DVT prophylaxis.  Lovenox Time spent: 39 minutes  Author: Arnetha Courser, MD 01/26/2024 2:14 PM Secure chat 7am to 7pm For on call review [email or web address].

## 2024-01-26 NOTE — Plan of Care (Signed)
  Problem: Respiratory: Goal: Ability to maintain adequate ventilation will improve Outcome: Progressing   Problem: Coping: Goal: Level of anxiety will decrease Outcome: Progressing   Problem: Respiratory: Goal: Ability to maintain a clear airway and adequate ventilation will improve Outcome: Progressing

## 2024-01-27 DIAGNOSIS — A419 Sepsis, unspecified organism: Secondary | ICD-10-CM | POA: Diagnosis not present

## 2024-01-27 DIAGNOSIS — I959 Hypotension, unspecified: Secondary | ICD-10-CM | POA: Diagnosis not present

## 2024-01-27 DIAGNOSIS — G9341 Metabolic encephalopathy: Secondary | ICD-10-CM | POA: Diagnosis not present

## 2024-01-27 DIAGNOSIS — J9601 Acute respiratory failure with hypoxia: Secondary | ICD-10-CM | POA: Diagnosis not present

## 2024-01-27 MED ORDER — GERHARDT'S BUTT CREAM
TOPICAL_CREAM | Freq: Three times a day (TID) | CUTANEOUS | Status: DC
  Administered 2024-01-31 – 2024-05-22 (×21): 1 via TOPICAL
  Filled 2024-01-27 (×13): qty 60

## 2024-01-27 NOTE — Plan of Care (Signed)
   Problem: Fluid Volume: Goal: Hemodynamic stability will improve Outcome: Progressing   Problem: Clinical Measurements: Goal: Diagnostic test results will improve Outcome: Progressing Goal: Signs and symptoms of infection will decrease Outcome: Progressing   Problem: Respiratory: Goal: Ability to maintain adequate ventilation will improve Outcome: Progressing

## 2024-01-27 NOTE — Consult Note (Signed)
 WOC Nurse Consult Note: Reason for Consult: worsening MASD buttocks/scrotum Wound type: moisture associated skin damage  ICD-10 CM Codes for Irritant Dermatitis  L24A2 - Due to fecal, urinary or dual incontinence Pressure Injury POA: no, not pressure  Measurement: widespread erythema to buttocks/perineum and scrotum, posterior thighs  Wound bed: erythema with partial thickness skin loss, what appear to be satellite lesions indicating yeast component  Drainage (amount, consistency, odor)  Periwound: erythema  Dressing procedure/placement/frequency: Cleanse buttocks/perineum and scrotum with Vashe wound cleanser [place] [phone]) do not rinse and allow to air dry. Apply Gerhardt's Butt Cream to entire area 3 times a day and prn soiling.  Sprinkle over Gerhardt's with floor stock antifungal powder (green and white label Microguard) for extra drying and antifungal component.   POC discussed with bedside nurse. WOC team will not follow. Re-consult if further needs arise.   Thank you,    Priscella Mann MSN, RN-BC, [employer] [phone]

## 2024-01-27 NOTE — Progress Notes (Signed)
 Progress Note   Patient: Alex [name] ZOX:[id number] DOB: 1996/03/14 DOA: 11/19/2023     69 DOS: the patient was seen and examined on 01/27/2024   Brief hospital course: 28 yo M presenting to [facility] ED from outpatient rehab on 11/19/23 for evaluation of altered mental status.   History obtained per chart review and mother's telephone interview, patient unable to participate in interview due to respiratory distress and baseline TBI. 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 He was treated at [facility] for 5 months then discharged to rehab. Per his mother and legal guardian his baseline is: Non verbal but he will yell out sporadic nonsensical words with left sided paralysis. He is able to move his RUE in order to feed himself with finger foods and he has some involuntary movement with that arm, he will swat at you. Similar baseline function with RLE, he can kick and move, but often will lay it bent and to the side. He has enough strength to even try and get out of bed on the right side. She denies any issues with swallowing, but eats mostly soft food. If he is not watched closely with eating he will try to eat all the food at once. The facility staff reported he was at his normal baseline until lunchtime on 11/19/23. It was observed he was more somnolent and not interactive. Staff noted a cough and slightly increased work of breathing. Mom also confirmed noting a congested cough the last time she visited earlier in the week. Staff and mom denied nausea/ vomiting, but mom reports chronic loose stools.   EMS reported the patient febrile and tachycardic on arrival.   ED course: Upon arrival patient tachycardic and lethargic. Sepsis protocol initiated with antibiotics and IVF resuscitation. Labs significant for mild hypokalemia, otherwise WNL. Initial imaging unremarkable but then patient became hypoxic with SpO2 85% on RA and a CTa was obtained, negative for PE but concerning for  aspiration pneumonia.  11/20/23: Admit to ICU due to acute hypoxic respiratory failure requiring urgent intubation and mechanical ventilatory support secondary to suspected aspiration and pneumonia 11/21/23- patient moving RUE, mother at bedside we reviewed medical plan. He remains on 72mcg/kg/hr levophed, today plan to rescusitate more aggresively and wean from levophed and potentially extubate post SBP.   He is febrile this am.  He is on zithromax, unasyn 11/22/23- s/p bronch yesterday with aspiration of mucus plugging.  Today resp status improved with liberation protocol in proces. SLP post extubation today. 11/23/23- patient is +for pseudomonas resp cultures.  He is also MRSA pcr +, refined therapy during rounds with pharmacist. He remains on MV 11/24/23- patient is still on vasopressor support weaning down on MV.  Secretions are slightly better.  11/25/23- patient weaned off levophed, failed SBT with tachypnea, tachycardia.  Secretions much improved. 11/26/23- on minimal vent support, unable to perform SBT due to copious secretions from ETT.   11/27/23- on minimal vent support, secretions improved.  Change Doxycycline to Vancomycin.  Plan for SBT as tolerated. 11/28/23-Pt successfully extubated 02/4, currently tolerating HHFNC @35L /35%.  Required low dose precedex overnight to allow suctioning due to excessive secretions. Pt with suspected seizure activity developed tremors in the right upper extremity and became minimally responsive.  Received 1g of iv keppra.   11/29/23-Overnight pt developed sinus tachycardia/svt 140 to 160's improved following 2.5 mg iv metoprolol.  Tolerating RA with no signs of respiratory distress.  Transferring to progressive care unit TRH to pick  on 02/7 12/01/23: Pt transferred back to ICU with severe acute hypoxic respiratory failure initially placed on HHFNC but due to significant hypoxia O2 sats in the 60's he required reintubation and underwent emergent bronchoscopy which revealed thick  secretions in the LUL and LLL resulting in mucous plugging. Therapeutic aspiration performed. CXR showed collapse of the LUL secondary to mucus plugging  12/03/23: Pt remains mechanically intubated on minimal vent settings.  ENT consulted for tracheostomy placement due to recurrent respiratory failure due to inability to clear secretions.  Requiring levophed gtt to maintain map 65 or higher  12/04/23: No acute events overnight on minimal settings pending tracheostomy placement  12/05/23: No acute events overnight, on minimal vent support. Initially on low dose Levophed, now weaned off.  IR placed PEG tube. Pending Tracheostomy placement on Friday. 12/06/23: No acute events overnight, on minimal vent support. Awaiting Tracheostomy placement tomorrow. 12/07/23: No acute events overnight.  Tracheostomy placed this morning by ENT, no events with procedure. Will keep sedated today with newly placed Trach with plan for WUA/SBT tomorrow. 12/08/23: All sedation turned off for WUA will perform SBT or TCT as tolerated.  Pt spike at temp 100.9 F, hypotensive requiring levophed gtt, and tachycardic concerning for possible sepsis.  Blood culture/tracheal aspirate and UA sent.  Restarted broad spectrum abx (vancomycin/zosyn) 12/09/23: Pt no longer requiring levophed gtt.  Tmax 102.4 F 12/10/23: Central Line Removed, not requiring pressors or sedation. Tolearting TC. 12/11/23: Overnight was started back on Gabapentin and scheduled Propanol for possible neurostorming with significant improvement in HR from the 170's to mid 100's 12/12/23: HR remains stable in low 100's, not requiring vasopressors, on minimal vent support, SBT as tolerated.  Complete course of ABX as suspect he is colonized with Pseudomonas and MRSA. 12/13/23: Overnight with concern for possible tube feeds in ETT tubing. Propanol was decreased due to soft BP. CXR this morning without new infiltrate, no leukocytosis or fever, and bowel pattern normal on KUB, will  restart tube feeds. Remains on minimal vent support, execise in PSV as tolerated ~ unable to tolerate less than 10/5 in PSV. 12/14/23: No significant events overnight.  Afebrile, hemodynamically stable.  On minimal vent support, continue with SBT/TCT as tolerated. 12/15/23: No change in patient's condition.  Remained on full vent support overnight.  Now on pressure support and tolerating. 12/16/23: Tolerated trach collar trials 12/17/23- patient awaiting placement.  He may have had another seizure and we will order EEG for him today.  He may not leave due to insurance qualification with trache.  12/18/23- Patient with no acute events overnight. Labs including cbc and bmp reviewed with no significant new findings.  12/19/2023.  Transferred to medical service.  Will transfer out of ICU.  Currently on trach collar 28% around 6 L flow.  Saturating well.  Started on Cardizem to control heart rate 2/27.  Increase midodrine to 15 mg 3 times daily.  Added metoprolol to control heart rate 2/28.  Titrating metoprolol to try to control heart rate. 2/25 to 3/4 TOC trying to work on placement. 3/5 patient had mucous plugging of the left mainstem bronchus 3/6 through 3/18.  Patient to go back to his facility but likely not the end of the month. 3/13.  Speech pathology cleared to go on ice chips and will do a modified swallow evaluation in the future. 3/19-3/25/25: Pt has remained medically stable this week. Speech will try to do MBSS this week providing pt will cooperate.  01/16/24- discussed with aunt and grand mom  at bedside, may not go to SNF until 3 weeks per TOC. 01/17/24- sleeping comfortably. No family at bedside, 01/18/2024-no events reported by nursing overnight, patient is calm. 4/2: Hemodynamically stable, need to complete trach training before returning back to his facility.   Assessment and Plan: * Hypotension Midodrine 15 mg 3 times daily to maintain blood pressure Blood pressure fairly stable  Acute  hypoxic respiratory failure (HCC)  S/p trach on 12/07/23. Continue w/ trach collar. ENT follow up appreciated. Difficulty tolerating PMV. Trach changed to size 6.  Tachycardia Continue Cardizem and metoprolol.  Septic shock (HCC) Resolved completed antibiotics.  On midodrine for blood pressure.    Acute metabolic encephalopathy In the setting of TBI.Marland Kitchen Patient able to move his right arm at baseline. Continue supportive care.  Aspiration pneumonia (HCC) Completed antibiotics.  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  Completed course of Zyvox and Zosyn.  Hypokalemia Replaced  Seizure disorder (HCC) Patient on valproic acid, Vimpat and gabapentin.  EEG did not show any seizure activity.  Anemia, unspecified Stable Hb.  No active bleeding. Last hemoglobin 12.1  01/11/24  History of traumatic brain injury Patient is bed bound. SNF resident.  Reactive thrombocytosis Last platelet count in normal range  Pressure injury of skin Present on admission.  See full description below. ,  Nutrition Documentation    Flowsheet Row ED to Hosp-Admission (Current) from 11/19/2023 in [facility]  Nutrition Problem Inadequate oral intake  Etiology inability to eat  [pt sedated and ventilated]  Nutrition Goal Patient will meet greater than or equal to 90% of their needs  Interventions Tube feeding, Prostat, Juven     ,  Active Pressure Injury/Wound(s)     Pressure Ulcer  Duration          Pressure Injury 11/20/23 Buttocks Left Stage 1 -  Intact skin with non-blanchable redness of a localized area usually over a bony prominence. 56 days   Pressure Injury 11/20/23 Foot Right;Medial;Distal Stage 1 -  Intact skin with non-blanchable redness of a localized area usually over a bony prominence. 56 days           (Optional):26781} Nursing supportive care. Fall, aspiration precautions. Diet:  Diet Orders (From admission, onward)     Start      Ordered   01/03/24 1126  Diet NPO time specified Except for: Ice Chips  Diet effective midnight       Comments: For tracheostomy placement on 02/14  Question:  Except for  Answer:  Ice Chips   01/03/24 1125           DVT prophylaxis: enoxaparin (LOVENOX) injection 40 mg Start: 12/09/23 2200 SCDs Start: 11/20/23 0140  Level of care: med-surg   Code Status: Full Code  Subjective:  Patient was seen and examined today.  No new concern.  Physical Exam: Vitals:   01/26/24 2049 01/27/24 0000 01/27/24 0358 01/27/24 0910  BP: 118/70 112/63 128/82 128/62  Pulse: (!) 110  (!) 107 (!) 102  Resp: 17  16 17   Temp: 98.3 F (36.8 C)  98.3 F (36.8 C) 97.9 F (36.6 C)  TempSrc: Axillary  Oral Axillary  SpO2: 97%  98% 95%  Weight:   66.2 kg   Height:       General.  Cognitively impaired gentleman, in no acute distress.  Trach collar in place Pulmonary.  Lungs clear bilaterally, normal respiratory effort. CV.  Regular rate and rhythm, no JVD, rub or murmur. Abdomen.  Soft,  nontender, nondistended, BS positive.  PEG tube in place CNS.  Awake, no new deficit Extremities.  No edema, pulses intact and symmetrical.   Data Reviewed:      Latest Ref Rng & Units 01/11/2024    2:58 AM 01/05/2024    4:12 AM 12/28/2023    6:16 AM  CBC  WBC 4.0 - 10.5 K/uL 4.5  5.0  5.9   Hemoglobin 13.0 - 17.0 g/dL 40.9  81.1  91.4   Hematocrit 39.0 - 52.0 % 37.6  33.4  34.5   Platelets 150 - 400 K/uL 245  318  338       Latest Ref Rng & Units 01/11/2024    2:58 AM 01/05/2024    4:12 AM 12/31/2023    5:42 AM  BMP  Glucose 70 - 99 mg/dL 91  86  782   BUN 6 - 20 mg/dL 24  17  24    Creatinine 0.61 - 1.24 mg/dL 9.56  2.13  0.86   Sodium 135 - 145 mmol/L 142  138  142   Potassium 3.5 - 5.1 mmol/L 4.1  4.2  4.2   Chloride 98 - 111 mmol/L 102  105  105   CO2 22 - 32 mmol/L 27  26  27    Calcium 8.9 - 10.3 mg/dL 9.5  8.8  9.2    No results found.   Family Communication:   Disposition: Status is:  Inpatient Remains inpatient appropriate because: severity of illness, trach patient unable to go to prior SNF  Planned Discharge Destination: Skilled nursing facility  DVT prophylaxis.  Lovenox Time spent: 40 minutes  Author: Arnetha Courser, MD 01/27/2024 2:14 PM Secure chat 7am to 7pm For on call review [email or web address].

## 2024-01-28 DIAGNOSIS — G9341 Metabolic encephalopathy: Secondary | ICD-10-CM | POA: Diagnosis not present

## 2024-01-28 DIAGNOSIS — J9601 Acute respiratory failure with hypoxia: Secondary | ICD-10-CM | POA: Diagnosis not present

## 2024-01-28 DIAGNOSIS — I959 Hypotension, unspecified: Secondary | ICD-10-CM | POA: Diagnosis not present

## 2024-01-28 DIAGNOSIS — A419 Sepsis, unspecified organism: Secondary | ICD-10-CM | POA: Diagnosis not present

## 2024-01-28 NOTE — TOC Progression Note (Signed)
 Transition of Care [facility]) - Progression Note    Patient Details  Name: Alex [name] MRN: [id number] Date of Birth: 05-22-1996  [facility] CM/SW Contact  Margarito Liner, LCSW Phone Number: 01/28/2024, 2:49 PM  Clinical Narrative:  CSW asked [facility] admissions coordinator to find out date for trach training.   LATE ENTRY: SNF admissions coordinator confirmed they are still planning to have trach training in 2-3 weeks.  Expected Discharge Plan: Skilled Nursing Facility Barriers to Discharge: Continued Medical Work up  Expected Discharge Plan and Services       Living arrangements for the past 2 months: Skilled Nursing Facility                                       Social Determinants of Health (SDOH) Interventions SDOH Screenings   Food Insecurity: Patient Unable To Answer (11/20/2023)  Housing: Patient Unable To Answer (11/20/2023)  Transportation Needs: Patient Unable To Answer (11/20/2023)  Utilities: Patient Unable To Answer (11/20/2023)  Financial Resource Strain: Low Risk  (12/02/2021)   Received from [facility]  Tobacco Use: Low Risk  (11/19/2023)    Readmission Risk Interventions     No data to display

## 2024-01-28 NOTE — Plan of Care (Signed)
  Problem: Fluid Volume: Goal: Hemodynamic stability will improve Outcome: Progressing   Problem: Clinical Measurements: Goal: Signs and symptoms of infection will decrease Outcome: Progressing

## 2024-01-28 NOTE — Progress Notes (Signed)
 Progress Note   Patient: Alex [name] KVQ:[id number] DOB: [birth year] DOA: 11/19/2023     70 DOS: the patient was seen and examined on 01/28/2024   Brief hospital course: 28 yo M presenting to [facility] ED from outpatient rehab on 11/19/23 for evaluation of altered mental status.   History obtained per chart review and mother's telephone interview, patient unable to participate in interview due to respiratory distress and baseline TBI. 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 He was treated at [facility] for 5 months then discharged to rehab. Per his mother and legal guardian his baseline is: Non verbal but he will yell out sporadic nonsensical words with left sided paralysis. He is able to move his RUE in order to feed himself with finger foods and he has some involuntary movement with that arm, he will swat at you. Similar baseline function with RLE, he can kick and move, but often will lay it bent and to the side. He has enough strength to even try and get out of bed on the right side. She denies any issues with swallowing, but eats mostly soft food. If he is not watched closely with eating he will try to eat all the food at once. The facility staff reported he was at his normal baseline until lunchtime on 11/19/23. It was observed he was more somnolent and not interactive. Staff noted a cough and slightly increased work of breathing. Mom also confirmed noting a congested cough the last time she visited earlier in the week. Staff and mom denied nausea/ vomiting, but mom reports chronic loose stools.   EMS reported the patient febrile and tachycardic on arrival.   ED course: Upon arrival patient tachycardic and lethargic. Sepsis protocol initiated with antibiotics and IVF resuscitation. Labs significant for mild hypokalemia, otherwise WNL. Initial imaging unremarkable but then patient became hypoxic with SpO2 85% on RA and a CTa was obtained, negative for PE but concerning for  aspiration pneumonia.  11/20/23: Admit to ICU due to acute hypoxic respiratory failure requiring urgent intubation and mechanical ventilatory support secondary to suspected aspiration and pneumonia 11/21/23- patient moving RUE, mother at bedside we reviewed medical plan. He remains on 83mcg/kg/hr levophed, today plan to rescusitate more aggresively and wean from levophed and potentially extubate post SBP.   He is febrile this am.  He is on zithromax, unasyn 11/22/23- s/p bronch yesterday with aspiration of mucus plugging.  Today resp status improved with liberation protocol in proces. SLP post extubation today. 11/23/23- patient is +for pseudomonas resp cultures.  He is also MRSA pcr +, refined therapy during rounds with pharmacist. He remains on MV 11/24/23- patient is still on vasopressor support weaning down on MV.  Secretions are slightly better.  11/25/23- patient weaned off levophed, failed SBT with tachypnea, tachycardia.  Secretions much improved. 11/26/23- on minimal vent support, unable to perform SBT due to copious secretions from ETT.   11/27/23- on minimal vent support, secretions improved.  Change Doxycycline to Vancomycin.  Plan for SBT as tolerated. 11/28/23-Pt successfully extubated 02/4, currently tolerating HHFNC @35L /35%.  Required low dose precedex overnight to allow suctioning due to excessive secretions. Pt with suspected seizure activity developed tremors in the right upper extremity and became minimally responsive.  Received 1g of iv keppra.   11/29/23-Overnight pt developed sinus tachycardia/svt 140 to 160's improved following 2.5 mg iv metoprolol.  Tolerating RA with no signs of respiratory distress.  Transferring to progressive care unit TRH to pick  on 02/7 12/01/23: Pt transferred back to ICU with severe acute hypoxic respiratory failure initially placed on HHFNC but due to significant hypoxia O2 sats in the 60's he required reintubation and underwent emergent bronchoscopy which revealed thick  secretions in the LUL and LLL resulting in mucous plugging. Therapeutic aspiration performed. CXR showed collapse of the LUL secondary to mucus plugging  12/03/23: Pt remains mechanically intubated on minimal vent settings.  ENT consulted for tracheostomy placement due to recurrent respiratory failure due to inability to clear secretions.  Requiring levophed gtt to maintain map 65 or higher  12/04/23: No acute events overnight on minimal settings pending tracheostomy placement  12/05/23: No acute events overnight, on minimal vent support. Initially on low dose Levophed, now weaned off.  IR placed PEG tube. Pending Tracheostomy placement on Friday. 12/06/23: No acute events overnight, on minimal vent support. Awaiting Tracheostomy placement tomorrow. 12/07/23: No acute events overnight.  Tracheostomy placed this morning by ENT, no events with procedure. Will keep sedated today with newly placed Trach with plan for WUA/SBT tomorrow. 12/08/23: All sedation turned off for WUA will perform SBT or TCT as tolerated.  Pt spike at temp 100.9 F, hypotensive requiring levophed gtt, and tachycardic concerning for possible sepsis.  Blood culture/tracheal aspirate and UA sent.  Restarted broad spectrum abx (vancomycin/zosyn) 12/09/23: Pt no longer requiring levophed gtt.  Tmax 102.4 F 12/10/23: Central Line Removed, not requiring pressors or sedation. Tolearting TC. 12/11/23: Overnight was started back on Gabapentin and scheduled Propanol for possible neurostorming with significant improvement in HR from the 170's to mid 100's 12/12/23: HR remains stable in low 100's, not requiring vasopressors, on minimal vent support, SBT as tolerated.  Complete course of ABX as suspect he is colonized with Pseudomonas and MRSA. 12/13/23: Overnight with concern for possible tube feeds in ETT tubing. Propanol was decreased due to soft BP. CXR this morning without new infiltrate, no leukocytosis or fever, and bowel pattern normal on KUB, will  restart tube feeds. Remains on minimal vent support, execise in PSV as tolerated ~ unable to tolerate less than 10/5 in PSV. 12/14/23: No significant events overnight.  Afebrile, hemodynamically stable.  On minimal vent support, continue with SBT/TCT as tolerated. 12/15/23: No change in patient's condition.  Remained on full vent support overnight.  Now on pressure support and tolerating. 12/16/23: Tolerated trach collar trials 12/17/23- patient awaiting placement.  He may have had another seizure and we will order EEG for him today.  He may not leave due to insurance qualification with trache.  12/18/23- Patient with no acute events overnight. Labs including cbc and bmp reviewed with no significant new findings.  12/19/2023.  Transferred to medical service.  Will transfer out of ICU.  Currently on trach collar 28% around 6 L flow.  Saturating well.  Started on Cardizem to control heart rate 2/27.  Increase midodrine to 15 mg 3 times daily.  Added metoprolol to control heart rate 2/28.  Titrating metoprolol to try to control heart rate. 2/25 to 3/4 TOC trying to work on placement. 3/5 patient had mucous plugging of the left mainstem bronchus 3/6 through 3/18.  Patient to go back to his facility but likely not the end of the month. 3/13.  Speech pathology cleared to go on ice chips and will do a modified swallow evaluation in the future. 3/19-3/25/25: Pt has remained medically stable this week. Speech will try to do MBSS this week providing pt will cooperate.  01/16/24- discussed with aunt and grand mom  at bedside, may not go to SNF until 3 weeks per TOC. 01/17/24- sleeping comfortably. No family at bedside, 01/18/2024-no events reported by nursing overnight, patient is calm. 4/2: Hemodynamically stable, need to complete trach training before returning back to his facility. 4/7: Partial-thickness wound on sacrum and scrotum due to loose bowel movements-rectal tube ordered and wound care was  consulted   Assessment and Plan: * Hypotension Midodrine 15 mg 3 times daily to maintain blood pressure Blood pressure fairly stable  Acute hypoxic respiratory failure (HCC)  S/p trach on 12/07/23. Continue w/ trach collar. ENT follow up appreciated. Difficulty tolerating PMV. Trach changed to size 6.  Tachycardia Continue Cardizem and metoprolol.  Septic shock (HCC) Resolved completed antibiotics.  On midodrine for blood pressure.    Acute metabolic encephalopathy In the setting of TBI.Marland Kitchen Patient able to move his right arm at baseline. Continue supportive care.  Aspiration pneumonia (HCC) Completed antibiotics.  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  Completed course of Zyvox and Zosyn.  Hypokalemia Replaced  Seizure disorder (HCC) Patient on valproic acid, Vimpat and gabapentin.  EEG did not show any seizure activity.  Anemia, unspecified Stable Hb.  No active bleeding. Last hemoglobin 12.1  01/11/24  History of traumatic brain injury Patient is bed bound. SNF resident.  Reactive thrombocytosis Last platelet count in normal range  Pressure injury of skin Present on admission.  See full description below. ,  Nutrition Documentation    Flowsheet Row ED to Hosp-Admission (Current) from 11/19/2023 in [facility] (1A)  Nutrition Problem Inadequate oral intake  Etiology inability to eat  [pt sedated and ventilated]  Nutrition Goal Patient will meet greater than or equal to 90% of their needs  Interventions Tube feeding, Prostat, Juven     ,  Active Pressure Injury/Wound(s)     Pressure Ulcer  Duration          Pressure Injury 11/20/23 Buttocks Left Stage 1 -  Intact skin with non-blanchable redness of a localized area usually over a bony prominence. 56 days   Pressure Injury 11/20/23 Foot Right;Medial;Distal Stage 1 -  Intact skin with non-blanchable redness of a localized area usually over a bony prominence. 56 days            (Optional):26781} Nursing supportive care. Fall, aspiration precautions. Diet:  Diet Orders (From admission, onward)     Start     Ordered   01/03/24 1126  Diet NPO time specified Except for: Ice Chips  Diet effective midnight       Comments: For tracheostomy placement on 02/14  Question:  Except for  Answer:  Ice Chips   01/03/24 1125           DVT prophylaxis: enoxaparin (LOVENOX) injection 40 mg Start: 12/09/23 2200 SCDs Start: 11/20/23 0140  Level of care: med-surg   Code Status: Full Code  Subjective:  Nursing concern of partial-thickness wound involving scrotum and sacrum as he was having loose bowel movements.  Physical Exam: Vitals:   01/28/24 0430 01/28/24 0500 01/28/24 0727 01/28/24 0855  BP:   120/66   Pulse:   62   Resp:   18   Temp:   97.6 F (36.4 C)   TempSrc:   Oral   SpO2: 99%  98% 96%  Weight:  69.4 kg    Height:       General.  Cognitively impaired gentleman, in no acute distress.  Trach collar in place Pulmonary.  Lungs clear bilaterally, normal  respiratory effort. CV.  Regular rate and rhythm, no JVD, rub or murmur. Abdomen.  Soft, nontender, nondistended, BS positive.  PEG tube in place CNS.  Alert and oriented .  No focal neurologic deficit. Extremities.  No edema, pulses intact and symmetrical.   Data Reviewed:      Latest Ref Rng & Units 01/11/2024    2:58 AM 01/05/2024    4:12 AM 12/28/2023    6:16 AM  CBC  WBC 4.0 - 10.5 K/uL 4.5  5.0  5.9   Hemoglobin 13.0 - 17.0 g/dL 30.1  60.1  09.3   Hematocrit 39.0 - 52.0 % 37.6  33.4  34.5   Platelets 150 - 400 K/uL 245  318  338       Latest Ref Rng & Units 01/11/2024    2:58 AM 01/05/2024    4:12 AM 12/31/2023    5:42 AM  BMP  Glucose 70 - 99 mg/dL 91  86  235   BUN 6 - 20 mg/dL 24  17  24    Creatinine 0.61 - 1.24 mg/dL 5.73  2.20  2.54   Sodium 135 - 145 mmol/L 142  138  142   Potassium 3.5 - 5.1 mmol/L 4.1  4.2  4.2   Chloride 98 - 111 mmol/L 102  105  105   CO2 22 -  32 mmol/L 27  26  27    Calcium 8.9 - 10.3 mg/dL 9.5  8.8  9.2    No results found.   Family Communication: Talked with mother on phone.  Disposition: Status is: Inpatient Remains inpatient appropriate because: severity of illness, trach patient unable to go to prior SNF  Planned Discharge Destination: Skilled nursing facility  DVT prophylaxis.  Lovenox Time spent: 45 minutes  Author: Arnetha Courser, MD 01/28/2024 2:15 PM Secure chat 7am to 7pm For on call review [email or web address].

## 2024-01-28 NOTE — Plan of Care (Signed)
   Problem: Respiratory: Goal: Ability to maintain adequate ventilation will improve Outcome: Progressing

## 2024-01-29 DIAGNOSIS — A419 Sepsis, unspecified organism: Secondary | ICD-10-CM | POA: Diagnosis not present

## 2024-01-29 DIAGNOSIS — I959 Hypotension, unspecified: Secondary | ICD-10-CM | POA: Diagnosis not present

## 2024-01-29 DIAGNOSIS — G9341 Metabolic encephalopathy: Secondary | ICD-10-CM | POA: Diagnosis not present

## 2024-01-29 DIAGNOSIS — J9601 Acute respiratory failure with hypoxia: Secondary | ICD-10-CM | POA: Diagnosis not present

## 2024-01-29 NOTE — Progress Notes (Signed)
 Progress Note   Patient: Alex [name] WUJ:[id number] DOB: [birth year] DOA: 11/19/2023     71 DOS: the patient was seen and examined on 01/29/2024   Brief hospital course: 28 yo M presenting to [facility] ED from outpatient rehab on 11/19/23 for evaluation of altered mental status.   History obtained per chart review and mother's telephone interview, patient unable to participate in interview due to respiratory distress and baseline TBI. 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 He was treated at [facility] for 5 months then discharged to rehab. Per his mother and legal guardian his baseline is: Non verbal but he will yell out sporadic nonsensical words with left sided paralysis. He is able to move his RUE in order to feed himself with finger foods and he has some involuntary movement with that arm, he will swat at you. Similar baseline function with RLE, he can kick and move, but often will lay it bent and to the side. He has enough strength to even try and get out of bed on the right side. She denies any issues with swallowing, but eats mostly soft food. If he is not watched closely with eating he will try to eat all the food at once. The facility staff reported he was at his normal baseline until lunchtime on 11/19/23. It was observed he was more somnolent and not interactive. Staff noted a cough and slightly increased work of breathing. Mom also confirmed noting a congested cough the last time she visited earlier in the week. Staff and mom denied nausea/ vomiting, but mom reports chronic loose stools.   EMS reported the patient febrile and tachycardic on arrival.   ED course: Upon arrival patient tachycardic and lethargic. Sepsis protocol initiated with antibiotics and IVF resuscitation. Labs significant for mild hypokalemia, otherwise WNL. Initial imaging unremarkable but then patient became hypoxic with SpO2 85% on RA and a CTa was obtained, negative for PE but concerning for  aspiration pneumonia.  11/20/23: Admit to ICU due to acute hypoxic respiratory failure requiring urgent intubation and mechanical ventilatory support secondary to suspected aspiration and pneumonia 11/21/23- patient moving RUE, mother at bedside we reviewed medical plan. He remains on 14mcg/kg/hr levophed, today plan to rescusitate more aggresively and wean from levophed and potentially extubate post SBP.   He is febrile this am.  He is on zithromax, unasyn 11/22/23- s/p bronch yesterday with aspiration of mucus plugging.  Today resp status improved with liberation protocol in proces. SLP post extubation today. 11/23/23- patient is +for pseudomonas resp cultures.  He is also MRSA pcr +, refined therapy during rounds with pharmacist. He remains on MV 11/24/23- patient is still on vasopressor support weaning down on MV.  Secretions are slightly better.  11/25/23- patient weaned off levophed, failed SBT with tachypnea, tachycardia.  Secretions much improved. 11/26/23- on minimal vent support, unable to perform SBT due to copious secretions from ETT.   11/27/23- on minimal vent support, secretions improved.  Change Doxycycline to Vancomycin.  Plan for SBT as tolerated. 11/28/23-Pt successfully extubated 02/4, currently tolerating HHFNC @35L /35%.  Required low dose precedex overnight to allow suctioning due to excessive secretions. Pt with suspected seizure activity developed tremors in the right upper extremity and became minimally responsive.  Received 1g of iv keppra.   11/29/23-Overnight pt developed sinus tachycardia/svt 140 to 160's improved following 2.5 mg iv metoprolol.  Tolerating RA with no signs of respiratory distress.  Transferring to progressive care unit TRH to pick  on 02/7 12/01/23: Pt transferred back to ICU with severe acute hypoxic respiratory failure initially placed on HHFNC but due to significant hypoxia O2 sats in the 60's he required reintubation and underwent emergent bronchoscopy which revealed thick  secretions in the LUL and LLL resulting in mucous plugging. Therapeutic aspiration performed. CXR showed collapse of the LUL secondary to mucus plugging  12/03/23: Pt remains mechanically intubated on minimal vent settings.  ENT consulted for tracheostomy placement due to recurrent respiratory failure due to inability to clear secretions.  Requiring levophed gtt to maintain map 65 or higher  12/04/23: No acute events overnight on minimal settings pending tracheostomy placement  12/05/23: No acute events overnight, on minimal vent support. Initially on low dose Levophed, now weaned off.  IR placed PEG tube. Pending Tracheostomy placement on Friday. 12/06/23: No acute events overnight, on minimal vent support. Awaiting Tracheostomy placement tomorrow. 12/07/23: No acute events overnight.  Tracheostomy placed this morning by ENT, no events with procedure. Will keep sedated today with newly placed Trach with plan for WUA/SBT tomorrow. 12/08/23: All sedation turned off for WUA will perform SBT or TCT as tolerated.  Pt spike at temp 100.9 F, hypotensive requiring levophed gtt, and tachycardic concerning for possible sepsis.  Blood culture/tracheal aspirate and UA sent.  Restarted broad spectrum abx (vancomycin/zosyn) 12/09/23: Pt no longer requiring levophed gtt.  Tmax 102.4 F 12/10/23: Central Line Removed, not requiring pressors or sedation. Tolearting TC. 12/11/23: Overnight was started back on Gabapentin and scheduled Propanol for possible neurostorming with significant improvement in HR from the 170's to mid 100's 12/12/23: HR remains stable in low 100's, not requiring vasopressors, on minimal vent support, SBT as tolerated.  Complete course of ABX as suspect he is colonized with Pseudomonas and MRSA. 12/13/23: Overnight with concern for possible tube feeds in ETT tubing. Propanol was decreased due to soft BP. CXR this morning without new infiltrate, no leukocytosis or fever, and bowel pattern normal on KUB, will  restart tube feeds. Remains on minimal vent support, execise in PSV as tolerated ~ unable to tolerate less than 10/5 in PSV. 12/14/23: No significant events overnight.  Afebrile, hemodynamically stable.  On minimal vent support, continue with SBT/TCT as tolerated. 12/15/23: No change in patient's condition.  Remained on full vent support overnight.  Now on pressure support and tolerating. 12/16/23: Tolerated trach collar trials 12/17/23- patient awaiting placement.  He may have had another seizure and we will order EEG for him today.  He may not leave due to insurance qualification with trache.  12/18/23- Patient with no acute events overnight. Labs including cbc and bmp reviewed with no significant new findings.  12/19/2023.  Transferred to medical service.  Will transfer out of ICU.  Currently on trach collar 28% around 6 L flow.  Saturating well.  Started on Cardizem to control heart rate 2/27.  Increase midodrine to 15 mg 3 times daily.  Added metoprolol to control heart rate 2/28.  Titrating metoprolol to try to control heart rate. 2/25 to 3/4 TOC trying to work on placement. 3/5 patient had mucous plugging of the left mainstem bronchus 3/6 through 3/18.  Patient to go back to his facility but likely not the end of the month. 3/13.  Speech pathology cleared to go on ice chips and will do a modified swallow evaluation in the future. 3/19-3/25/25: Pt has remained medically stable this week. Speech will try to do MBSS this week providing pt will cooperate.  01/16/24- discussed with aunt and grand mom  at bedside, may not go to SNF until 3 weeks per TOC. 01/17/24- sleeping comfortably. No family at bedside, 01/18/2024-no events reported by nursing overnight, patient is calm. 4/2: Hemodynamically stable, need to complete trach training before returning back to his facility. 4/7: Partial-thickness wound on sacrum and scrotum due to loose bowel movements-rectal tube ordered and wound care was consulted 4/8:  Still pending trach training at his facility.  Diarrhea resolved   Assessment and Plan: * Hypotension Midodrine 15 mg 3 times daily to maintain blood pressure Blood pressure fairly stable  Acute hypoxic respiratory failure (HCC)  S/p trach on 12/07/23. Continue w/ trach collar. ENT follow up appreciated. Difficulty tolerating PMV. Trach changed to size 6.  Tachycardia Continue Cardizem and metoprolol.  Septic shock (HCC) Resolved completed antibiotics.  On midodrine for blood pressure.    Acute metabolic encephalopathy In the setting of TBI.Marland Kitchen Patient able to move his right arm at baseline. Continue supportive care.  Aspiration pneumonia (HCC) Completed antibiotics.  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  Completed course of Zyvox and Zosyn.  Hypokalemia Replaced  Seizure disorder (HCC) Patient on valproic acid, Vimpat and gabapentin.  EEG did not show any seizure activity.  Anemia, unspecified Stable Hb.  No active bleeding. Last hemoglobin 12.1  01/11/24  History of traumatic brain injury Patient is bed bound. SNF resident.  Reactive thrombocytosis Last platelet count in normal range  Pressure injury of skin Present on admission.  See full description below. ,  Nutrition Documentation    Flowsheet Row ED to Hosp-Admission (Current) from 11/19/2023 in [facility] (1A)  Nutrition Problem Inadequate oral intake  Etiology inability to eat  [pt sedated and ventilated]  Nutrition Goal Patient will meet greater than or equal to 90% of their needs  Interventions Tube feeding, Prostat, Juven     ,  Active Pressure Injury/Wound(s)     Pressure Ulcer  Duration          Pressure Injury 11/20/23 Buttocks Left Stage 1 -  Intact skin with non-blanchable redness of a localized area usually over a bony prominence. 56 days   Pressure Injury 11/20/23 Foot Right;Medial;Distal Stage 1 -  Intact skin with non-blanchable redness of a  localized area usually over a bony prominence. 56 days           (Optional):26781} Nursing supportive care. Fall, aspiration precautions. Diet:  Diet Orders (From admission, onward)     Start     Ordered   01/03/24 1126  Diet NPO time specified Except for: Ice Chips  Diet effective midnight       Comments: For tracheostomy placement on 02/14  Question:  Except for  Answer:  Ice Chips   01/03/24 1125           DVT prophylaxis: enoxaparin (LOVENOX) injection 40 mg Start: 12/09/23 2200 SCDs Start: 11/20/23 0140  Level of care: med-surg   Code Status: Full Code  Subjective:  Patient was seen and examined today.  No new concern.  No more diarrhea.  Physical Exam: Vitals:   01/29/24 0414 01/29/24 0500 01/29/24 0823 01/29/24 1148  BP: 122/78  120/80   Pulse: 98  100   Resp: 18  18   Temp: 98 F (36.7 C)     TempSrc: Axillary     SpO2: 97%  100% 100%  Weight:  67.8 kg    Height:       General.  Cognitively impaired gentleman, in no acute distress. Pulmonary.  Lungs clear bilaterally, normal respiratory effort.  Trach collar in place CV.  Regular rate and rhythm, no JVD, rub or murmur. Abdomen.  Soft, nontender, nondistended, BS positive.  PEG tube in place CNS.  Alert and oriented .  No focal neurologic deficit. Extremities.  No edema, no cyanosis, pulses intact and symmetrical.  Data Reviewed:      Latest Ref Rng & Units 01/11/2024    2:58 AM 01/05/2024    4:12 AM 12/28/2023    6:16 AM  CBC  WBC 4.0 - 10.5 K/uL 4.5  5.0  5.9   Hemoglobin 13.0 - 17.0 g/dL 64.4  03.4  74.2   Hematocrit 39.0 - 52.0 % 37.6  33.4  34.5   Platelets 150 - 400 K/uL 245  318  338       Latest Ref Rng & Units 01/11/2024    2:58 AM 01/05/2024    4:12 AM 12/31/2023    5:42 AM  BMP  Glucose 70 - 99 mg/dL 91  86  595   BUN 6 - 20 mg/dL 24  17  24    Creatinine 0.61 - 1.24 mg/dL 6.38  7.56  4.33   Sodium 135 - 145 mmol/L 142  138  142   Potassium 3.5 - 5.1 mmol/L 4.1  4.2  4.2    Chloride 98 - 111 mmol/L 102  105  105   CO2 22 - 32 mmol/L 27  26  27    Calcium 8.9 - 10.3 mg/dL 9.5  8.8  9.2    No results found.   Family Communication: Talked with mother on phone.  Disposition: Status is: Inpatient Remains inpatient appropriate because: severity of illness, trach patient unable to go to prior SNF  Planned Discharge Destination: Skilled nursing facility  DVT prophylaxis.  Lovenox Time spent: 40 minutes  Author: Arnetha Courser, MD 01/29/2024 2:25 PM Secure chat 7am to 7pm For on call review [email or web address].

## 2024-01-29 NOTE — Plan of Care (Signed)
  Problem: Respiratory: Goal: Ability to maintain a clear airway and adequate ventilation will improve Outcome: Progressing   Problem: Clinical Measurements: Goal: Cardiovascular complication will be avoided Outcome: Progressing   Problem: Nutrition: Goal: Adequate nutrition will be maintained Outcome: Progressing   Problem: Pain Managment: Goal: General experience of comfort will improve and/or be controlled Outcome: Progressing   Problem: Safety: Goal: Ability to remain free from injury will improve Outcome: Progressing

## 2024-01-29 NOTE — TOC Progression Note (Signed)
 Transition of Care [facility]) - Progression Note    Patient Details  Name: YAEL ANGERER MRN: [id number] Date of Birth: [birth year]  Transition of Care [facility]) CM/SW Contact  Margarito Liner, LCSW Phone Number: 01/29/2024, 4:59 PM  Clinical Narrative:   Janina Mayo training will take place at [facility] on April 17.  Expected Discharge Plan: Skilled Nursing Facility Barriers to Discharge: Continued Medical Work up  Expected Discharge Plan and Services       Living arrangements for the past 2 months: Skilled Nursing Facility                                       Social Determinants of Health (SDOH) Interventions SDOH Screenings   Food Insecurity: Patient Unable To Answer (11/20/2023)  Housing: Patient Unable To Answer (11/20/2023)  Transportation Needs: Patient Unable To Answer (11/20/2023)  Utilities: Patient Unable To Answer (11/20/2023)  Financial Resource Strain: Low Risk  (12/02/2021)   Received from [facility]  Tobacco Use: Low Risk  (11/19/2023)    Readmission Risk Interventions     No data to display

## 2024-01-29 NOTE — Plan of Care (Signed)
  Problem: Fluid Volume: Goal: Hemodynamic stability will improve Outcome: Progressing   Problem: Clinical Measurements: Goal: Diagnostic test results will improve Outcome: Progressing Goal: Signs and symptoms of infection will decrease Outcome: Progressing   Problem: Respiratory: Goal: Ability to maintain adequate ventilation will improve Outcome: Progressing   Problem: Activity: Goal: Ability to tolerate increased activity will improve Outcome: Progressing   Problem: Respiratory: Goal: Ability to maintain a clear airway and adequate ventilation will improve Outcome: Progressing   Problem: Role Relationship: Goal: Method of communication will improve Outcome: Progressing   Problem: Education: Goal: Knowledge of General Education information will improve Description: Including pain rating scale, medication(s)/side effects and non-pharmacologic comfort measures Outcome: Progressing   Problem: Health Behavior/Discharge Planning: Goal: Ability to manage health-related needs will improve Outcome: Progressing   Problem: Clinical Measurements: Goal: Ability to maintain clinical measurements within normal limits will improve Outcome: Progressing Goal: Will remain free from infection Outcome: Progressing Goal: Diagnostic test results will improve Outcome: Progressing Goal: Respiratory complications will improve Outcome: Progressing Goal: Cardiovascular complication will be avoided Outcome: Progressing   Problem: Activity: Goal: Risk for activity intolerance will decrease Outcome: Progressing   Problem: Nutrition: Goal: Adequate nutrition will be maintained Outcome: Progressing   Problem: Coping: Goal: Level of anxiety will decrease Outcome: Progressing   Problem: Elimination: Goal: Will not experience complications related to bowel motility Outcome: Progressing Goal: Will not experience complications related to urinary retention Outcome: Progressing   Problem:  Pain Managment: Goal: General experience of comfort will improve and/or be controlled Outcome: Progressing   Problem: Safety: Goal: Ability to remain free from injury will improve Outcome: Progressing   Problem: Skin Integrity: Goal: Risk for impaired skin integrity will decrease Outcome: Progressing   Problem: Activity: Goal: Ability to tolerate increased activity will improve Outcome: Progressing   Problem: Respiratory: Goal: Ability to maintain a clear airway and adequate ventilation will improve Outcome: Progressing   Problem: Role Relationship: Goal: Method of communication will improve Outcome: Progressing

## 2024-01-30 DIAGNOSIS — I959 Hypotension, unspecified: Secondary | ICD-10-CM | POA: Diagnosis not present

## 2024-01-30 DIAGNOSIS — A419 Sepsis, unspecified organism: Secondary | ICD-10-CM | POA: Diagnosis not present

## 2024-01-30 DIAGNOSIS — J9601 Acute respiratory failure with hypoxia: Secondary | ICD-10-CM | POA: Diagnosis not present

## 2024-01-30 DIAGNOSIS — G9341 Metabolic encephalopathy: Secondary | ICD-10-CM | POA: Diagnosis not present

## 2024-01-30 LAB — BASIC METABOLIC PANEL WITH GFR
Anion gap: 9 (ref 5–15)
BUN: 16 mg/dL (ref 6–20)
CO2: 26 mmol/L (ref 22–32)
Calcium: 9.4 mg/dL (ref 8.9–10.3)
Chloride: 103 mmol/L (ref 98–111)
Creatinine, Ser: 0.4 mg/dL — ABNORMAL LOW (ref 0.61–1.24)
GFR, Estimated: >60 mL/min (ref >60)
Glucose, Bld: 87 mg/dL (ref 70–99)
Potassium: 4.2 mmol/L (ref 3.5–5.1)
Sodium: 138 mmol/L (ref 135–145)

## 2024-01-30 LAB — CBC
HCT: 42.7 % (ref 39.0–52.0)
Hemoglobin: 14 g/dL (ref 13.0–17.0)
MCH: 29.5 pg (ref 26.0–34.0)
MCHC: 32.8 g/dL (ref 30.0–36.0)
MCV: 90.1 fL (ref 80.0–100.0)
Platelets: 246 10*3/uL (ref 150–400)
RBC: 4.74 MIL/uL (ref 4.22–5.81)
RDW: 12.5 % (ref 11.5–15.5)
WBC: 5.1 10*3/uL (ref 4.0–10.5)
nRBC: 0 % (ref 0.0–0.2)

## 2024-01-30 MED ORDER — ORAL CARE MOUTH RINSE
15.0000 mL | OROMUCOSAL | Status: DC | PRN
  Administered 2024-05-12: 15 mL via OROMUCOSAL

## 2024-01-30 MED ORDER — ORAL CARE MOUTH RINSE
15.0000 mL | OROMUCOSAL | Status: DC
  Administered 2024-01-31 – 2024-02-03 (×13): 15 mL via OROMUCOSAL

## 2024-01-30 MED ORDER — MORPHINE SULFATE (PF) 2 MG/ML IV SOLN
2.0000 mg | INTRAVENOUS | Status: DC | PRN
  Administered 2024-01-31 – 2024-02-01 (×2): 2 mg via SUBCUTANEOUS
  Filled 2024-01-30 (×4): qty 1
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
## 2024-01-30 NOTE — Plan of Care (Signed)
  Problem: Respiratory: Goal: Ability to maintain a clear airway and adequate ventilation will improve Outcome: Progressing   

## 2024-01-30 NOTE — TOC Progression Note (Signed)
 Transition of Care [facility]) - Progression Note    Patient Details  Name: Alex [name] MRN: [id number] Date of Birth: 10-06-1996  [facility] CM/SW Contact  Margarito Liner, LCSW Phone Number: 01/30/2024, 9:30 AM  Clinical Narrative:  CSW left patient's mother a voicemail. Will provide update once she calls back.   Expected Discharge Plan: Skilled Nursing Facility Barriers to Discharge: Continued Medical Work up  Expected Discharge Plan and Services       Living arrangements for the past 2 months: Skilled Nursing Facility                                       Social Determinants of Health (SDOH) Interventions SDOH Screenings   Food Insecurity: Patient Unable To Answer (11/20/2023)  Housing: Patient Unable To Answer (11/20/2023)  Transportation Needs: Patient Unable To Answer (11/20/2023)  Utilities: Patient Unable To Answer (11/20/2023)  Financial Resource Strain: Low Risk  (12/02/2021)   Received from [facility]  Tobacco Use: Low Risk  (11/19/2023)    Readmission Risk Interventions     No data to display

## 2024-01-30 NOTE — Progress Notes (Signed)
 Progress Note   Patient: Alex [name] AVW:[id number] DOB: [birth year] DOA: 11/19/2023     72 DOS: the patient was seen and examined on 01/30/2024   Brief hospital course: 28 yo M presenting to [facility] ED from outpatient rehab on 11/19/23 for evaluation of altered mental status.   History obtained per chart review and mother's telephone interview, patient unable to participate in interview due to respiratory distress and baseline TBI. 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 He was treated at [facility] for 5 months then discharged to rehab. Per his mother and legal guardian his baseline is: Non verbal but he will yell out sporadic nonsensical words with left sided paralysis. He is able to move his RUE in order to feed himself with finger foods and he has some involuntary movement with that arm, he will swat at you. Similar baseline function with RLE, he can kick and move, but often will lay it bent and to the side. He has enough strength to even try and get out of bed on the right side. She denies any issues with swallowing, but eats mostly soft food. If he is not watched closely with eating he will try to eat all the food at once. The facility staff reported he was at his normal baseline until lunchtime on 11/19/23. It was observed he was more somnolent and not interactive. Staff noted a cough and slightly increased work of breathing. Mom also confirmed noting a congested cough the last time she visited earlier in the week. Staff and mom denied nausea/ vomiting, but mom reports chronic loose stools.   EMS reported the patient febrile and tachycardic on arrival.   ED course: Upon arrival patient tachycardic and lethargic. Sepsis protocol initiated with antibiotics and IVF resuscitation. Labs significant for mild hypokalemia, otherwise WNL. Initial imaging unremarkable but then patient became hypoxic with SpO2 85% on RA and a CTa was obtained, negative for PE but concerning for  aspiration pneumonia.  11/20/23: Admit to ICU due to acute hypoxic respiratory failure requiring urgent intubation and mechanical ventilatory support secondary to suspected aspiration and pneumonia 11/21/23- patient moving RUE, mother at bedside we reviewed medical plan. He remains on 66mcg/kg/hr levophed, today plan to rescusitate more aggresively and wean from levophed and potentially extubate post SBP.   He is febrile this am.  He is on zithromax, unasyn 11/22/23- s/p bronch yesterday with aspiration of mucus plugging.  Today resp status improved with liberation protocol in proces. SLP post extubation today. 11/23/23- patient is +for pseudomonas resp cultures.  He is also MRSA pcr +, refined therapy during rounds with pharmacist. He remains on MV 11/24/23- patient is still on vasopressor support weaning down on MV.  Secretions are slightly better.  11/25/23- patient weaned off levophed, failed SBT with tachypnea, tachycardia.  Secretions much improved. 11/26/23- on minimal vent support, unable to perform SBT due to copious secretions from ETT.   11/27/23- on minimal vent support, secretions improved.  Change Doxycycline to Vancomycin.  Plan for SBT as tolerated. 11/28/23-Pt successfully extubated 02/4, currently tolerating HHFNC @35L /35%.  Required low dose precedex overnight to allow suctioning due to excessive secretions. Pt with suspected seizure activity developed tremors in the right upper extremity and became minimally responsive.  Received 1g of iv keppra.   11/29/23-Overnight pt developed sinus tachycardia/svt 140 to 160's improved following 2.5 mg iv metoprolol.  Tolerating RA with no signs of respiratory distress.  Transferring to progressive care unit TRH to pick  on 02/7 12/01/23: Pt transferred back to ICU with severe acute hypoxic respiratory failure initially placed on HHFNC but due to significant hypoxia O2 sats in the 60's he required reintubation and underwent emergent bronchoscopy which revealed thick  secretions in the LUL and LLL resulting in mucous plugging. Therapeutic aspiration performed. CXR showed collapse of the LUL secondary to mucus plugging  12/03/23: Pt remains mechanically intubated on minimal vent settings.  ENT consulted for tracheostomy placement due to recurrent respiratory failure due to inability to clear secretions.  Requiring levophed gtt to maintain map 65 or higher  12/04/23: No acute events overnight on minimal settings pending tracheostomy placement  12/05/23: No acute events overnight, on minimal vent support. Initially on low dose Levophed, now weaned off.  IR placed PEG tube. Pending Tracheostomy placement on Friday. 12/06/23: No acute events overnight, on minimal vent support. Awaiting Tracheostomy placement tomorrow. 12/07/23: No acute events overnight.  Tracheostomy placed this morning by ENT, no events with procedure. Will keep sedated today with newly placed Trach with plan for WUA/SBT tomorrow. 12/08/23: All sedation turned off for WUA will perform SBT or TCT as tolerated.  Pt spike at temp 100.9 F, hypotensive requiring levophed gtt, and tachycardic concerning for possible sepsis.  Blood culture/tracheal aspirate and UA sent.  Restarted broad spectrum abx (vancomycin/zosyn) 12/09/23: Pt no longer requiring levophed gtt.  Tmax 102.4 F 12/10/23: Central Line Removed, not requiring pressors or sedation. Tolearting TC. 12/11/23: Overnight was started back on Gabapentin and scheduled Propanol for possible neurostorming with significant improvement in HR from the 170's to mid 100's 12/12/23: HR remains stable in low 100's, not requiring vasopressors, on minimal vent support, SBT as tolerated.  Complete course of ABX as suspect he is colonized with Pseudomonas and MRSA. 12/13/23: Overnight with concern for possible tube feeds in ETT tubing. Propanol was decreased due to soft BP. CXR this morning without new infiltrate, no leukocytosis or fever, and bowel pattern normal on KUB, will  restart tube feeds. Remains on minimal vent support, execise in PSV as tolerated ~ unable to tolerate less than 10/5 in PSV. 12/14/23: No significant events overnight.  Afebrile, hemodynamically stable.  On minimal vent support, continue with SBT/TCT as tolerated. 12/15/23: No change in patient's condition.  Remained on full vent support overnight.  Now on pressure support and tolerating. 12/16/23: Tolerated trach collar trials 12/17/23- patient awaiting placement.  He may have had another seizure and we will order EEG for him today.  He may not leave due to insurance qualification with trache.  12/18/23- Patient with no acute events overnight. Labs including cbc and bmp reviewed with no significant new findings.  12/19/2023.  Transferred to medical service.  Will transfer out of ICU.  Currently on trach collar 28% around 6 L flow.  Saturating well.  Started on Cardizem to control heart rate 2/27.  Increase midodrine to 15 mg 3 times daily.  Added metoprolol to control heart rate 2/28.  Titrating metoprolol to try to control heart rate. 2/25 to 3/4 TOC trying to work on placement. 3/5 patient had mucous plugging of the left mainstem bronchus 3/6 through 3/18.  Patient to go back to his facility but likely not the end of the month. 3/13.  Speech pathology cleared to go on ice chips and will do a modified swallow evaluation in the future. 3/19-3/25/25: Pt has remained medically stable this week. Speech will try to do MBSS this week providing pt will cooperate.  01/16/24- discussed with aunt and grand mom  at bedside, may not go to SNF until 3 weeks per TOC. 01/17/24- sleeping comfortably. No family at bedside, 01/18/2024-no events reported by nursing overnight, patient is calm. 4/2: Hemodynamically stable, need to complete trach training before returning back to his facility. 4/7: Partial-thickness wound on sacrum and scrotum due to loose bowel movements-rectal tube ordered and wound care was consulted 4/8:  Still pending trach training at his facility.  Diarrhea resolved 4/9: Patient little more combative and pulled rectal tube, we will hold off until he develops diarrhea.  Trach training is scheduled for 4/17 at his facility.   Assessment and Plan: * Hypotension Midodrine 15 mg 3 times daily to maintain blood pressure Blood pressure fairly stable  Acute hypoxic respiratory failure (HCC)  S/p trach on 12/07/23. Continue w/ trach collar. ENT follow up appreciated. Difficulty tolerating PMV. Trach changed to size 6.  Currently stable  Tachycardia Continue Cardizem and metoprolol.  Septic shock (HCC) Resolved completed antibiotics.  On midodrine for blood pressure.    Acute metabolic encephalopathy In the setting of TBI.Marland Kitchen Patient able to move his right arm at baseline. Continue supportive care.  Aspiration pneumonia (HCC) Completed antibiotics.  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  Completed course of Zyvox and Zosyn.  Hypokalemia Replaced  Seizure disorder (HCC) Patient on valproic acid, Vimpat and gabapentin.  EEG did not show any seizure activity.  Anemia, unspecified Stable Hb.  No active bleeding. Last hemoglobin 12.1  01/11/24  History of traumatic brain injury Patient is bed bound. SNF resident.  Reactive thrombocytosis Last platelet count in normal range  Pressure injury of skin Present on admission.  See full description below. ,  Nutrition Documentation    Flowsheet Row ED to Hosp-Admission (Current) from 11/19/2023 in [facility] (1A)  Nutrition Problem Inadequate oral intake  Etiology inability to eat  [pt sedated and ventilated]  Nutrition Goal Patient will meet greater than or equal to 90% of their needs  Interventions Tube feeding, Prostat, Juven     ,  Active Pressure Injury/Wound(s)     Pressure Ulcer  Duration          Pressure Injury 11/20/23 Buttocks Left Stage 1 -  Intact skin with non-blanchable  redness of a localized area usually over a bony prominence. 56 days   Pressure Injury 11/20/23 Foot Right;Medial;Distal Stage 1 -  Intact skin with non-blanchable redness of a localized area usually over a bony prominence. 56 days           (Optional):26781} Nursing supportive care. Fall, aspiration precautions. Diet:  Diet Orders (From admission, onward)     Start     Ordered   01/03/24 1126  Diet NPO time specified Except for: Ice Chips  Diet effective midnight       Comments: For tracheostomy placement on 02/14  Question:  Except for  Answer:  Ice Chips   01/03/24 1125           DVT prophylaxis: enoxaparin (LOVENOX) injection 40 mg Start: 12/09/23 2200 SCDs Start: 11/20/23 0140  Level of care: med-surg   Code Status: Full Code  Subjective:  Patient was sleeping comfortably when seen today.  He was quite combative earlier.  Physical Exam: Vitals:   01/29/24 2043 01/30/24 0427 01/30/24 0435 01/30/24 0758  BP: 121/78 124/70  116/72  Pulse: 100 99  96  Resp: 18 15  16   Temp: 97.7 F (36.5 C) (!) 97.4 F (36.3 C)  98.7 F (37.1 C)  TempSrc:  Oral Oral  Oral  SpO2: 98% 100%  100%  Weight:   63.3 kg   Height:       General.cognitively impaired gentleman, in no acute distress. Pulmonary.  Lungs clear bilaterally, normal respiratory effort. CV.  Regular rate and rhythm, no JVD, rub or murmur. Abdomen.  Soft, nontender, nondistended, BS positive. CNS.  Sleeping, no neurodeficit Extremities.  No edema, pulses intact and symmetrical.   Data Reviewed:      Latest Ref Rng & Units 01/30/2024    5:09 AM 01/11/2024    2:58 AM 01/05/2024    4:12 AM  CBC  WBC 4.0 - 10.5 K/uL 5.1  4.5  5.0   Hemoglobin 13.0 - 17.0 g/dL 29.5  28.4  13.2   Hematocrit 39.0 - 52.0 % 42.7  37.6  33.4   Platelets 150 - 400 K/uL 246  245  318       Latest Ref Rng & Units 01/30/2024    5:09 AM 01/11/2024    2:58 AM 01/05/2024    4:12 AM  BMP  Glucose 70 - 99 mg/dL 87  91  86   BUN 6 - 20  mg/dL 16  24  17    Creatinine 0.61 - 1.24 mg/dL 4.40  1.02  7.25   Sodium 135 - 145 mmol/L 138  142  138   Potassium 3.5 - 5.1 mmol/L 4.2  4.1  4.2   Chloride 98 - 111 mmol/L 103  102  105   CO2 22 - 32 mmol/L 26  27  26    Calcium 8.9 - 10.3 mg/dL 9.4  9.5  8.8    No results found.   Family Communication:   Disposition: Status is: Inpatient Remains inpatient appropriate because: severity of illness, trach patient unable to go to prior SNF  Planned Discharge Destination: Skilled nursing facility  DVT prophylaxis.  Lovenox Time spent: 40 minutes  Author: Arnetha Courser, MD 01/30/2024 1:33 PM Secure chat 7am to 7pm For on call review [email or web address].

## 2024-01-30 NOTE — Plan of Care (Signed)
  Problem: Respiratory: Goal: Ability to maintain adequate ventilation will improve Outcome: Progressing   Problem: Activity: Goal: Ability to tolerate increased activity will improve Outcome: Progressing   Problem: Education: Goal: Knowledge of General Education information will improve Description: Including pain rating scale, medication(s)/side effects and non-pharmacologic comfort measures Outcome: Not Progressing Note: Pt Is confused, unable to educate.

## 2024-01-31 DIAGNOSIS — G9341 Metabolic encephalopathy: Secondary | ICD-10-CM | POA: Diagnosis not present

## 2024-01-31 DIAGNOSIS — A419 Sepsis, unspecified organism: Secondary | ICD-10-CM | POA: Diagnosis not present

## 2024-01-31 DIAGNOSIS — J9601 Acute respiratory failure with hypoxia: Secondary | ICD-10-CM | POA: Diagnosis not present

## 2024-01-31 DIAGNOSIS — I959 Hypotension, unspecified: Secondary | ICD-10-CM | POA: Diagnosis not present

## 2024-01-31 MED ORDER — DIAZEPAM 5 MG/ML IJ SOLN
2.5000 mg | Freq: Once | INTRAMUSCULAR | Status: AC
  Administered 2024-01-31: 2.5 mg via INTRAMUSCULAR
  Filled 2024-01-31: qty 2

## 2024-01-31 NOTE — Plan of Care (Signed)
  Problem: Fluid Volume: Goal: Hemodynamic stability will improve Outcome: Not Progressing   Problem: Activity: Goal: Ability to tolerate increased activity will improve Outcome: Not Progressing   Problem: Role Relationship: Goal: Method of communication will improve Outcome: Not Progressing   Problem: Education: Goal: Knowledge of General Education information will improve Description: Including pain rating scale, medication(s)/side effects and non-pharmacologic comfort measures Outcome: Not Progressing   Problem: Health Behavior/Discharge Planning: Goal: Ability to manage health-related needs will improve Outcome: Not Progressing   Problem: Activity: Goal: Risk for activity intolerance will decrease Outcome: Not Progressing   Problem: Coping: Goal: Level of anxiety will decrease Outcome: Not Progressing   Problem: Safety: Goal: Ability to remain free from injury will improve Outcome: Not Progressing   Problem: Activity: Goal: Ability to tolerate increased activity will improve Outcome: Not Progressing   Problem: Role Relationship: Goal: Method of communication will improve Outcome: Not Progressing   Problem: Clinical Measurements: Goal: Diagnostic test results will improve Outcome: Adequate for Discharge Goal: Signs and symptoms of infection will decrease Outcome: Adequate for Discharge   Problem: Respiratory: Goal: Ability to maintain adequate ventilation will improve Outcome: Adequate for Discharge   Problem: Respiratory: Goal: Ability to maintain a clear airway and adequate ventilation will improve Outcome: Adequate for Discharge   Problem: Clinical Measurements: Goal: Ability to maintain clinical measurements within normal limits will improve Outcome: Adequate for Discharge Goal: Will remain free from infection Outcome: Adequate for Discharge Goal: Diagnostic test results will improve Outcome: Adequate for Discharge Goal: Respiratory complications  will improve Outcome: Adequate for Discharge Goal: Cardiovascular complication will be avoided Outcome: Adequate for Discharge   Problem: Nutrition: Goal: Adequate nutrition will be maintained Outcome: Adequate for Discharge   Problem: Elimination: Goal: Will not experience complications related to bowel motility Outcome: Adequate for Discharge Goal: Will not experience complications related to urinary retention Outcome: Adequate for Discharge   Problem: Pain Managment: Goal: General experience of comfort will improve and/or be controlled Outcome: Adequate for Discharge   Problem: Skin Integrity: Goal: Risk for impaired skin integrity will decrease Outcome: Adequate for Discharge   Problem: Respiratory: Goal: Ability to maintain a clear airway and adequate ventilation will improve Outcome: Adequate for Discharge

## 2024-01-31 NOTE — Progress Notes (Signed)
 Progress Note   Patient: Alex [name]:[id number] DOB: 18-Nov-1995 DOA: 11/19/2023     73 DOS: the patient was seen and examined on 01/31/2024   Brief hospital course: 28 yo M presenting to [facility] ED from outpatient rehab on 11/19/23 for evaluation of altered mental status.   History obtained per chart review and mother's telephone interview, patient unable to participate in interview due to respiratory distress and baseline TBI. 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 He was treated at [facility] for 5 months then discharged to rehab. Per his mother and legal guardian his baseline is: Non verbal but he will yell out sporadic nonsensical words with left sided paralysis. He is able to move his RUE in order to feed himself with finger foods and he has some involuntary movement with that arm, he will swat at you. Similar baseline function with RLE, he can kick and move, but often will lay it bent and to the side. He has enough strength to even try and get out of bed on the right side. She denies any issues with swallowing, but eats mostly soft food. If he is not watched closely with eating he will try to eat all the food at once. The facility staff reported he was at his normal baseline until lunchtime on 11/19/23. It was observed he was more somnolent and not interactive. Staff noted a cough and slightly increased work of breathing. Mom also confirmed noting a congested cough the last time she visited earlier in the week. Staff and mom denied nausea/ vomiting, but mom reports chronic loose stools.   EMS reported the patient febrile and tachycardic on arrival.   ED course: Upon arrival patient tachycardic and lethargic. Sepsis protocol initiated with antibiotics and IVF resuscitation. Labs significant for mild hypokalemia, otherwise WNL. Initial imaging unremarkable but then patient became hypoxic with SpO2 85% on RA and a CTa was obtained, negative for PE but concerning for  aspiration pneumonia.  11/20/23: Admit to ICU due to acute hypoxic respiratory failure requiring urgent intubation and mechanical ventilatory support secondary to suspected aspiration and pneumonia 11/21/23- patient moving RUE, mother at bedside we reviewed medical plan. He remains on 78mcg/kg/hr levophed, today plan to rescusitate more aggresively and wean from levophed and potentially extubate post SBP.   He is febrile this am.  He is on zithromax, unasyn 11/22/23- s/p bronch yesterday with aspiration of mucus plugging.  Today resp status improved with liberation protocol in proces. SLP post extubation today. 11/23/23- patient is +for pseudomonas resp cultures.  He is also MRSA pcr +, refined therapy during rounds with pharmacist. He remains on MV 11/24/23- patient is still on vasopressor support weaning down on MV.  Secretions are slightly better.  11/25/23- patient weaned off levophed, failed SBT with tachypnea, tachycardia.  Secretions much improved. 11/26/23- on minimal vent support, unable to perform SBT due to copious secretions from ETT.   11/27/23- on minimal vent support, secretions improved.  Change Doxycycline to Vancomycin.  Plan for SBT as tolerated. 11/28/23-Pt successfully extubated 02/4, currently tolerating HHFNC @35L /35%.  Required low dose precedex overnight to allow suctioning due to excessive secretions. Pt with suspected seizure activity developed tremors in the right upper extremity and became minimally responsive.  Received 1g of iv keppra.   11/29/23-Overnight pt developed sinus tachycardia/svt 140 to 160's improved following 2.5 mg iv metoprolol.  Tolerating RA with no signs of respiratory distress.  Transferring to progressive care unit TRH to pick  on 02/7 12/01/23: Pt transferred back to ICU with severe acute hypoxic respiratory failure initially placed on HHFNC but due to significant hypoxia O2 sats in the 60's he required reintubation and underwent emergent bronchoscopy which revealed thick  secretions in the LUL and LLL resulting in mucous plugging. Therapeutic aspiration performed. CXR showed collapse of the LUL secondary to mucus plugging  12/03/23: Pt remains mechanically intubated on minimal vent settings.  ENT consulted for tracheostomy placement due to recurrent respiratory failure due to inability to clear secretions.  Requiring levophed gtt to maintain map 65 or higher  12/04/23: No acute events overnight on minimal settings pending tracheostomy placement  12/05/23: No acute events overnight, on minimal vent support. Initially on low dose Levophed, now weaned off.  IR placed PEG tube. Pending Tracheostomy placement on Friday. 12/06/23: No acute events overnight, on minimal vent support. Awaiting Tracheostomy placement tomorrow. 12/07/23: No acute events overnight.  Tracheostomy placed this morning by ENT, no events with procedure. Will keep sedated today with newly placed Trach with plan for WUA/SBT tomorrow. 12/08/23: All sedation turned off for WUA will perform SBT or TCT as tolerated.  Pt spike at temp 100.9 F, hypotensive requiring levophed gtt, and tachycardic concerning for possible sepsis.  Blood culture/tracheal aspirate and UA sent.  Restarted broad spectrum abx (vancomycin/zosyn) 12/09/23: Pt no longer requiring levophed gtt.  Tmax 102.4 F 12/10/23: Central Line Removed, not requiring pressors or sedation. Tolearting TC. 12/11/23: Overnight was started back on Gabapentin and scheduled Propanol for possible neurostorming with significant improvement in HR from the 170's to mid 100's 12/12/23: HR remains stable in low 100's, not requiring vasopressors, on minimal vent support, SBT as tolerated.  Complete course of ABX as suspect he is colonized with Pseudomonas and MRSA. 12/13/23: Overnight with concern for possible tube feeds in ETT tubing. Propanol was decreased due to soft BP. CXR this morning without new infiltrate, no leukocytosis or fever, and bowel pattern normal on KUB, will  restart tube feeds. Remains on minimal vent support, execise in PSV as tolerated ~ unable to tolerate less than 10/5 in PSV. 12/14/23: No significant events overnight.  Afebrile, hemodynamically stable.  On minimal vent support, continue with SBT/TCT as tolerated. 12/15/23: No change in patient's condition.  Remained on full vent support overnight.  Now on pressure support and tolerating. 12/16/23: Tolerated trach collar trials 12/17/23- patient awaiting placement.  He may have had another seizure and we will order EEG for him today.  He may not leave due to insurance qualification with trache.  12/18/23- Patient with no acute events overnight. Labs including cbc and bmp reviewed with no significant new findings.  12/19/2023.  Transferred to medical service.  Will transfer out of ICU.  Currently on trach collar 28% around 6 L flow.  Saturating well.  Started on Cardizem to control heart rate 2/27.  Increase midodrine to 15 mg 3 times daily.  Added metoprolol to control heart rate 2/28.  Titrating metoprolol to try to control heart rate. 2/25 to 3/4 TOC trying to work on placement. 3/5 patient had mucous plugging of the left mainstem bronchus 3/6 through 3/18.  Patient to go back to his facility but likely not the end of the month. 3/13.  Speech pathology cleared to go on ice chips and will do a modified swallow evaluation in the future. 3/19-3/25/25: Pt has remained medically stable this week. Speech will try to do MBSS this week providing pt will cooperate.  01/16/24- discussed with aunt and grand mom  at bedside, may not go to SNF until 3 weeks per TOC. 01/17/24- sleeping comfortably. No family at bedside, 01/18/2024-no events reported by nursing overnight, patient is calm. 4/2: Hemodynamically stable, need to complete trach training before returning back to his facility. 4/7: Partial-thickness wound on sacrum and scrotum due to loose bowel movements-rectal tube ordered and wound care was consulted 4/8:  Still pending trach training at his facility.  Diarrhea resolved 4/9: Patient little more combative and pulled rectal tube, we will hold off until he develops diarrhea.  Trach training is scheduled for 4/17 at his facility. 4/10: Overnight combativeness requiring IM Valium.   Assessment and Plan: * Hypotension Midodrine 15 mg 3 times daily to maintain blood pressure Blood pressure fairly stable  Acute hypoxic respiratory failure (HCC)  S/p trach on 12/07/23. Continue w/ trach collar. ENT follow up appreciated. Difficulty tolerating PMV. Trach changed to size 6.  Currently stable  Tachycardia Continue Cardizem and metoprolol.  Septic shock (HCC) Resolved completed antibiotics.  On midodrine for blood pressure.    Acute metabolic encephalopathy In the setting of TBI.Marland Kitchen Patient able to move his right arm at baseline. Continue supportive care.  Aspiration pneumonia (HCC) Completed antibiotics.  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  Completed course of Zyvox and Zosyn.  Hypokalemia Replaced  Seizure disorder (HCC) Patient on valproic acid, Vimpat and gabapentin.  EEG did not show any seizure activity.  Anemia, unspecified Stable Hb.  No active bleeding. Last hemoglobin 12.1  01/11/24  History of traumatic brain injury Patient is bed bound. SNF resident.  Reactive thrombocytosis Last platelet count in normal range  Pressure injury of skin Present on admission.  See full description below. ,  Nutrition Documentation    Flowsheet Row ED to Hosp-Admission (Current) from 11/19/2023 in [facility] (1A)  Nutrition Problem Inadequate oral intake  Etiology inability to eat  [pt sedated and ventilated]  Nutrition Goal Patient will meet greater than or equal to 90% of their needs  Interventions Tube feeding, Prostat, Juven     ,  Active Pressure Injury/Wound(s)     Pressure Ulcer  Duration          Pressure Injury 11/20/23  Buttocks Left Stage 1 -  Intact skin with non-blanchable redness of a localized area usually over a bony prominence. 56 days   Pressure Injury 11/20/23 Foot Right;Medial;Distal Stage 1 -  Intact skin with non-blanchable redness of a localized area usually over a bony prominence. 56 days           (Optional):26781} Nursing supportive care. Fall, aspiration precautions. Diet:  Diet Orders (From admission, onward)     Start     Ordered   01/03/24 1126  Diet NPO time specified Except for: Ice Chips  Diet effective midnight       Comments: For tracheostomy placement on 02/14  Question:  Except for  Answer:  Ice Chips   01/03/24 1125           DVT prophylaxis: enoxaparin (LOVENOX) injection 40 mg Start: 12/09/23 2200 SCDs Start: 11/20/23 0140  Level of care: med-surg   Code Status: Full Code  Subjective:  Overnight nursing concern of becoming very combative requiring IM Valium.  Patient was sleeping when seen today.  Physical Exam: Vitals:   01/31/24 0405 01/31/24 0414 01/31/24 0500 01/31/24 0738  BP: 128/85 (!) 152/101  131/82  Pulse: 96 80  99  Resp: 19 18  16   Temp: 99.4 F (37.4 C) 97.8  F (36.6 C)  98.3 F (36.8 C)  TempSrc:    Oral  SpO2: (!) 84% 100%  100%  Weight:   67.2 kg   Height:       General.  Cognitively impaired gentleman, in no acute distress.  Trach collar in place Pulmonary.  Lungs clear bilaterally, normal respiratory effort. CV.  Regular rate and rhythm, no JVD, rub or murmur. Abdomen.  Soft, nontender, nondistended, BS positive.  PEG tube in place CNS.  Alert and oriented .  No focal neurologic deficit. Extremities.  No edema, no cyanosis, pulses intact and symmetrical.    Data Reviewed:      Latest Ref Rng & Units 01/30/2024    5:09 AM 01/11/2024    2:58 AM 01/05/2024    4:12 AM  CBC  WBC 4.0 - 10.5 K/uL 5.1  4.5  5.0   Hemoglobin 13.0 - 17.0 g/dL 40.9  81.1  91.4   Hematocrit 39.0 - 52.0 % 42.7  37.6  33.4   Platelets 150 - 400 K/uL 246   245  318       Latest Ref Rng & Units 01/30/2024    5:09 AM 01/11/2024    2:58 AM 01/05/2024    4:12 AM  BMP  Glucose 70 - 99 mg/dL 87  91  86   BUN 6 - 20 mg/dL 16  24  17    Creatinine 0.61 - 1.24 mg/dL 7.82  9.56  2.13   Sodium 135 - 145 mmol/L 138  142  138   Potassium 3.5 - 5.1 mmol/L 4.2  4.1  4.2   Chloride 98 - 111 mmol/L 103  102  105   CO2 22 - 32 mmol/L 26  27  26    Calcium 8.9 - 10.3 mg/dL 9.4  9.5  8.8    No results found.   Family Communication:   Disposition: Status is: Inpatient Remains inpatient appropriate because: severity of illness, trach patient unable to go to prior SNF  Planned Discharge Destination: Skilled nursing facility  DVT prophylaxis.  Lovenox Time spent: 39 minutes  Author: Arnetha Courser, MD 01/31/2024 2:41 PM Secure chat 7am to 7pm For on call review [email or web address].

## 2024-01-31 NOTE — Progress Notes (Signed)
       CROSS COVER NOTE  NAME: TAMAR LIPSCOMB MRN: [id number] DOB : November 21, 1995 ATTENDING PHYSICIAN: Arnetha Courser, MD    Date of Service   01/31/2024   HPI/Events of Note   Patient continues to be agitated and combative tstriking out at staff members when trying to care for him.   Interventions   Assessment/Plan: Patietn is with minimal to no communicative ability. First impression f behaviors were that concerning for pain as he kept scooting himself in the bed and rubbing his face. However 2 mg SQ morphine were given and behaviors did not change. 2.5 mg IM valium        Donnie Mesa NP [facility] Cross Cover 7pm-7am - check amion for availability Pager [phone]

## 2024-01-31 NOTE — TOC Progression Note (Signed)
 Transition of Care [facility]) - Progression Note    Patient Details  Name: Alex [name] MRN: [id number] Date of Birth: Dec 04, 1995  Transition of Care [facility]) CM/SW Contact  Margarito Liner, LCSW Phone Number: 01/31/2024, 3:06 PM  Clinical Narrative:   Received call back from mom. Provided update.  Expected Discharge Plan: Skilled Nursing Facility Barriers to Discharge: Continued Medical Work up  Expected Discharge Plan and Services       Living arrangements for the past 2 months: Skilled Nursing Facility                                       Social Determinants of Health (SDOH) Interventions SDOH Screenings   Food Insecurity: Patient Unable To Answer (11/20/2023)  Housing: Patient Unable To Answer (11/20/2023)  Transportation Needs: Patient Unable To Answer (11/20/2023)  Utilities: Patient Unable To Answer (11/20/2023)  Financial Resource Strain: Low Risk  (12/02/2021)   Received from [facility]  Tobacco Use: Low Risk  (11/19/2023)    Readmission Risk Interventions     No data to display

## 2024-01-31 NOTE — Progress Notes (Signed)
 Informed NP Jon Billings about patient current uncooperative and combative behavior. Received orders for subcutaneous morphine, behavior may be due to pain.  Padded side rails to prevent injury to patient. Patient is pulling on side rails and placing head between the openings. Concerned for safety. Patient continues to remove padding. Patient continues to be combative.

## 2024-01-31 NOTE — Plan of Care (Signed)
  Problem: Respiratory: Goal: Ability to maintain adequate ventilation will improve Outcome: Progressing   Problem: Activity: Goal: Ability to tolerate increased activity will improve Outcome: Progressing   Problem: Nutrition: Goal: Adequate nutrition will be maintained Outcome: Progressing   Problem: Coping: Goal: Level of anxiety will decrease Outcome: Progressing

## 2024-02-01 DIAGNOSIS — J9601 Acute respiratory failure with hypoxia: Secondary | ICD-10-CM | POA: Diagnosis not present

## 2024-02-01 DIAGNOSIS — G9341 Metabolic encephalopathy: Secondary | ICD-10-CM | POA: Diagnosis not present

## 2024-02-01 DIAGNOSIS — I959 Hypotension, unspecified: Secondary | ICD-10-CM | POA: Diagnosis not present

## 2024-02-01 DIAGNOSIS — A419 Sepsis, unspecified organism: Secondary | ICD-10-CM | POA: Diagnosis not present

## 2024-02-01 NOTE — Progress Notes (Signed)
 Progress Note   Patient: Alex [name] ZOX:[id number] DOB: Jan 10, 1996 DOA: 11/19/2023     74 DOS: the patient was seen and examined on 02/01/2024   Brief hospital course: 28 yo M presenting to [facility] ED from outpatient rehab on 11/19/23 for evaluation of altered mental status.   History obtained per chart review and mother's telephone interview, patient unable to participate in interview due to respiratory distress and baseline TBI. 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 He was treated at [facility] for 5 months then discharged to rehab. Per his mother and legal guardian his baseline is: Non verbal but he will yell out sporadic nonsensical words with left sided paralysis. He is able to move his RUE in order to feed himself with finger foods and he has some involuntary movement with that arm, he will swat at you. Similar baseline function with RLE, he can kick and move, but often will lay it bent and to the side. He has enough strength to even try and get out of bed on the right side. She denies any issues with swallowing, but eats mostly soft food. If he is not watched closely with eating he will try to eat all the food at once. The facility staff reported he was at his normal baseline until lunchtime on 11/19/23. It was observed he was more somnolent and not interactive. Staff noted a cough and slightly increased work of breathing. Mom also confirmed noting a congested cough the last time she visited earlier in the week. Staff and mom denied nausea/ vomiting, but mom reports chronic loose stools.   EMS reported the patient febrile and tachycardic on arrival.   ED course: Upon arrival patient tachycardic and lethargic. Sepsis protocol initiated with antibiotics and IVF resuscitation. Labs significant for mild hypokalemia, otherwise WNL. Initial imaging unremarkable but then patient became hypoxic with SpO2 85% on RA and a CTa was obtained, negative for PE but concerning for  aspiration pneumonia.  11/20/23: Admit to ICU due to acute hypoxic respiratory failure requiring urgent intubation and mechanical ventilatory support secondary to suspected aspiration and pneumonia 11/21/23- patient moving RUE, mother at bedside we reviewed medical plan. He remains on 81mcg/kg/hr levophed, today plan to rescusitate more aggresively and wean from levophed and potentially extubate post SBP.   He is febrile this am.  He is on zithromax, unasyn 11/22/23- s/p bronch yesterday with aspiration of mucus plugging.  Today resp status improved with liberation protocol in proces. SLP post extubation today. 11/23/23- patient is +for pseudomonas resp cultures.  He is also MRSA pcr +, refined therapy during rounds with pharmacist. He remains on MV 11/24/23- patient is still on vasopressor support weaning down on MV.  Secretions are slightly better.  11/25/23- patient weaned off levophed, failed SBT with tachypnea, tachycardia.  Secretions much improved. 11/26/23- on minimal vent support, unable to perform SBT due to copious secretions from ETT.   11/27/23- on minimal vent support, secretions improved.  Change Doxycycline to Vancomycin.  Plan for SBT as tolerated. 11/28/23-Pt successfully extubated 02/4, currently tolerating HHFNC @35L /35%.  Required low dose precedex overnight to allow suctioning due to excessive secretions. Pt with suspected seizure activity developed tremors in the right upper extremity and became minimally responsive.  Received 1g of iv keppra.   11/29/23-Overnight pt developed sinus tachycardia/svt 140 to 160's improved following 2.5 mg iv metoprolol.  Tolerating RA with no signs of respiratory distress.  Transferring to progressive care unit TRH to pick  on 02/7 12/01/23: Pt transferred back to ICU with severe acute hypoxic respiratory failure initially placed on HHFNC but due to significant hypoxia O2 sats in the 60's he required reintubation and underwent emergent bronchoscopy which revealed thick  secretions in the LUL and LLL resulting in mucous plugging. Therapeutic aspiration performed. CXR showed collapse of the LUL secondary to mucus plugging  12/03/23: Pt remains mechanically intubated on minimal vent settings.  ENT consulted for tracheostomy placement due to recurrent respiratory failure due to inability to clear secretions.  Requiring levophed gtt to maintain map 65 or higher  12/04/23: No acute events overnight on minimal settings pending tracheostomy placement  12/05/23: No acute events overnight, on minimal vent support. Initially on low dose Levophed, now weaned off.  IR placed PEG tube. Pending Tracheostomy placement on Friday. 12/06/23: No acute events overnight, on minimal vent support. Awaiting Tracheostomy placement tomorrow. 12/07/23: No acute events overnight.  Tracheostomy placed this morning by ENT, no events with procedure. Will keep sedated today with newly placed Trach with plan for WUA/SBT tomorrow. 12/08/23: All sedation turned off for WUA will perform SBT or TCT as tolerated.  Pt spike at temp 100.9 F, hypotensive requiring levophed gtt, and tachycardic concerning for possible sepsis.  Blood culture/tracheal aspirate and UA sent.  Restarted broad spectrum abx (vancomycin/zosyn) 12/09/23: Pt no longer requiring levophed gtt.  Tmax 102.4 F 12/10/23: Central Line Removed, not requiring pressors or sedation. Tolearting TC. 12/11/23: Overnight was started back on Gabapentin and scheduled Propanol for possible neurostorming with significant improvement in HR from the 170's to mid 100's 12/12/23: HR remains stable in low 100's, not requiring vasopressors, on minimal vent support, SBT as tolerated.  Complete course of ABX as suspect he is colonized with Pseudomonas and MRSA. 12/13/23: Overnight with concern for possible tube feeds in ETT tubing. Propanol was decreased due to soft BP. CXR this morning without new infiltrate, no leukocytosis or fever, and bowel pattern normal on KUB, will  restart tube feeds. Remains on minimal vent support, execise in PSV as tolerated ~ unable to tolerate less than 10/5 in PSV. 12/14/23: No significant events overnight.  Afebrile, hemodynamically stable.  On minimal vent support, continue with SBT/TCT as tolerated. 12/15/23: No change in patient's condition.  Remained on full vent support overnight.  Now on pressure support and tolerating. 12/16/23: Tolerated trach collar trials 12/17/23- patient awaiting placement.  He may have had another seizure and we will order EEG for him today.  He may not leave due to insurance qualification with trache.  12/18/23- Patient with no acute events overnight. Labs including cbc and bmp reviewed with no significant new findings.  12/19/2023.  Transferred to medical service.  Will transfer out of ICU.  Currently on trach collar 28% around 6 L flow.  Saturating well.  Started on Cardizem to control heart rate 2/27.  Increase midodrine to 15 mg 3 times daily.  Added metoprolol to control heart rate 2/28.  Titrating metoprolol to try to control heart rate. 2/25 to 3/4 TOC trying to work on placement. 3/5 patient had mucous plugging of the left mainstem bronchus 3/6 through 3/18.  Patient to go back to his facility but likely not the end of the month. 3/13.  Speech pathology cleared to go on ice chips and will do a modified swallow evaluation in the future. 3/19-3/25/25: Pt has remained medically stable this week. Speech will try to do MBSS this week providing pt will cooperate.  01/16/24- discussed with aunt and grand mom  at bedside, may not go to SNF until 3 weeks per TOC. 01/17/24- sleeping comfortably. No family at bedside, 01/18/2024-no events reported by nursing overnight, patient is calm. 4/2: Hemodynamically stable, need to complete trach training before returning back to his facility. 4/7: Partial-thickness wound on sacrum and scrotum due to loose bowel movements-rectal tube ordered and wound care was consulted 4/8:  Still pending trach training at his facility.  Diarrhea resolved 4/9: Patient little more combative and pulled rectal tube, we will hold off until he develops diarrhea.  Trach training is scheduled for 4/17 at his facility. 4/10: Overnight combativeness requiring IM Valium. 4/11: Received another dose of IM Valium last night   Assessment and Plan: * Hypotension Midodrine 15 mg 3 times daily to maintain blood pressure Blood pressure fairly stable  Acute hypoxic respiratory failure (HCC)  S/p trach on 12/07/23. Continue w/ trach collar. ENT follow up appreciated. Difficulty tolerating PMV. Trach changed to size 6.  Currently stable  Tachycardia Continue Cardizem and metoprolol.  Septic shock (HCC) Resolved completed antibiotics.  On midodrine for blood pressure.    Acute metabolic encephalopathy In the setting of TBI.Marland Kitchen Patient able to move his right arm at baseline. Continue supportive care.  Aspiration pneumonia (HCC) Completed antibiotics.  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  Completed course of Zyvox and Zosyn.  Hypokalemia Replaced  Seizure disorder (HCC) Patient on valproic acid, Vimpat and gabapentin.  EEG did not show any seizure activity.  Anemia, unspecified Stable Hb.  No active bleeding. Last hemoglobin 12.1  01/11/24  History of traumatic brain injury Patient is bed bound. SNF resident.  Reactive thrombocytosis Last platelet count in normal range  Pressure injury of skin Present on admission.  See full description below. ,  Nutrition Documentation    Flowsheet Row ED to Hosp-Admission (Current) from 11/19/2023 in [facility] (1A)  Nutrition Problem Inadequate oral intake  Etiology inability to eat  [pt sedated and ventilated]  Nutrition Goal Patient will meet greater than or equal to 90% of their needs  Interventions Tube feeding, Prostat, Juven     ,  Active Pressure Injury/Wound(s)     Pressure Ulcer   Duration          Pressure Injury 11/20/23 Buttocks Left Stage 1 -  Intact skin with non-blanchable redness of a localized area usually over a bony prominence. 56 days   Pressure Injury 11/20/23 Foot Right;Medial;Distal Stage 1 -  Intact skin with non-blanchable redness of a localized area usually over a bony prominence. 56 days           (Optional):26781} Nursing supportive care. Fall, aspiration precautions. Diet:  Diet Orders (From admission, onward)     Start     Ordered   01/03/24 1126  Diet NPO time specified Except for: Ice Chips  Diet effective midnight       Comments: For tracheostomy placement on 02/14  Question:  Except for  Answer:  Ice Chips   01/03/24 1125           DVT prophylaxis: enoxaparin (LOVENOX) injection 40 mg Start: 12/09/23 2200 SCDs Start: 11/20/23 0140  Level of care: med-surg   Code Status: Full Code  Subjective:  Patient with no new concern.  Again becoming quite combative overnight.  Physical Exam: Vitals:   01/31/24 1946 01/31/24 2134 02/01/24 0648 02/01/24 0817  BP:  (!) 113/90 107/73 126/78  Pulse:  100 88 94  Resp:  16 16 17   Temp:  97.7  F (36.5 C) 97.9 F (36.6 C) 97.9 F (36.6 C)  TempSrc:   Axillary Axillary  SpO2: 100% 98% 97% 98%  Weight:      Height:       General.  Cognitively impaired gentleman, in no acute distress. Trach collar in place Pulmonary.  Lungs clear bilaterally, normal respiratory effort. CV.  Regular rate and rhythm, no JVD, rub or murmur. Abdomen.  Soft, nontender, nondistended, BS positive.  PEG tube in place CNS.  Alert and oriented .  No focal neurologic deficit. Extremities.  No edema, no cyanosis, pulses intact and symmetrical.    Data Reviewed:      Latest Ref Rng & Units 01/30/2024    5:09 AM 01/11/2024    2:58 AM 01/05/2024    4:12 AM  CBC  WBC 4.0 - 10.5 K/uL 5.1  4.5  5.0   Hemoglobin 13.0 - 17.0 g/dL 65.7  84.6  96.2   Hematocrit 39.0 - 52.0 % 42.7  37.6  33.4   Platelets 150 - 400  K/uL 246  245  318       Latest Ref Rng & Units 01/30/2024    5:09 AM 01/11/2024    2:58 AM 01/05/2024    4:12 AM  BMP  Glucose 70 - 99 mg/dL 87  91  86   BUN 6 - 20 mg/dL 16  24  17    Creatinine 0.61 - 1.24 mg/dL 9.52  8.41  3.24   Sodium 135 - 145 mmol/L 138  142  138   Potassium 3.5 - 5.1 mmol/L 4.2  4.1  4.2   Chloride 98 - 111 mmol/L 103  102  105   CO2 22 - 32 mmol/L 26  27  26    Calcium 8.9 - 10.3 mg/dL 9.4  9.5  8.8    No results found.   Family Communication: Mother was updated  Disposition: Status is: Inpatient Remains inpatient appropriate because: severity of illness, trach patient unable to go to prior SNF  Planned Discharge Destination: Skilled nursing facility  DVT prophylaxis.  Lovenox Time spent: 40 minutes  Author: Arnetha Courser, MD 02/01/2024 3:55 PM Secure chat 7am to 7pm For on call review [email or web address].

## 2024-02-01 NOTE — Plan of Care (Signed)
  Problem: Activity: Goal: Ability to tolerate increased activity will improve Outcome: Progressing   Problem: Role Relationship: Goal: Method of communication will improve Outcome: Progressing

## 2024-02-02 ENCOUNTER — Encounter: Payer: Self-pay | Admitting: Internal Medicine

## 2024-02-02 DIAGNOSIS — Z87820 Personal history of traumatic brain injury: Secondary | ICD-10-CM | POA: Diagnosis not present

## 2024-02-02 DIAGNOSIS — G40909 Epilepsy, unspecified, not intractable, without status epilepticus: Secondary | ICD-10-CM | POA: Diagnosis not present

## 2024-02-02 DIAGNOSIS — J9601 Acute respiratory failure with hypoxia: Secondary | ICD-10-CM | POA: Diagnosis not present
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
## 2024-02-03 DIAGNOSIS — I959 Hypotension, unspecified: Secondary | ICD-10-CM | POA: Diagnosis not present

## 2024-02-03 DIAGNOSIS — G9341 Metabolic encephalopathy: Secondary | ICD-10-CM | POA: Diagnosis not present

## 2024-02-03 DIAGNOSIS — D75838 Other thrombocytosis: Secondary | ICD-10-CM | POA: Diagnosis not present

## 2024-02-03 DIAGNOSIS — J9601 Acute respiratory failure with hypoxia: Secondary | ICD-10-CM | POA: Diagnosis not present
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
## 2024-02-03 NOTE — Plan of Care (Signed)
  Problem: Fluid Volume: Goal: Hemodynamic stability will improve Outcome: Progressing   Problem: Clinical Measurements: Goal: Diagnostic test results will improve Outcome: Progressing Goal: Signs and symptoms of infection will decrease Outcome: Progressing   Problem: Respiratory: Goal: Ability to maintain adequate ventilation will improve Outcome: Progressing   Problem: Activity: Goal: Ability to tolerate increased activity will improve Outcome: Progressing   Problem: Respiratory: Goal: Ability to maintain a clear airway and adequate ventilation will improve Outcome: Progressing   Problem: Role Relationship: Goal: Method of communication will improve Outcome: Progressing   Problem: Activity: Goal: Ability to tolerate increased activity will improve Outcome: Progressing   Problem: Respiratory: Goal: Ability to maintain a clear airway and adequate ventilation will improve Outcome: Progressing   Problem: Role Relationship: Goal: Method of communication will improve Outcome: Progressing   Problem: Education: Goal: Knowledge of General Education information will improve Description: Including pain rating scale, medication(s)/side effects and non-pharmacologic comfort measures Outcome: Progressing   Problem: Health Behavior/Discharge Planning: Goal: Ability to manage health-related needs will improve Outcome: Progressing   Problem: Clinical Measurements: Goal: Ability to maintain clinical measurements within normal limits will improve Outcome: Progressing Goal: Will remain free from infection Outcome: Progressing Goal: Diagnostic test results will improve Outcome: Progressing Goal: Respiratory complications will improve Outcome: Progressing Goal: Cardiovascular complication will be avoided Outcome: Progressing   Problem: Activity: Goal: Risk for activity intolerance will decrease Outcome: Progressing   Problem: Nutrition: Goal: Adequate nutrition will be  maintained Outcome: Progressing   Problem: Coping: Goal: Level of anxiety will decrease Outcome: Progressing   Problem: Elimination: Goal: Will not experience complications related to bowel motility Outcome: Progressing Goal: Will not experience complications related to urinary retention Outcome: Progressing   Problem: Pain Managment: Goal: General experience of comfort will improve and/or be controlled Outcome: Progressing   Problem: Safety: Goal: Ability to remain free from injury will improve Outcome: Progressing   Problem: Skin Integrity: Goal: Risk for impaired skin integrity will decrease Outcome: Progressing

## 2024-02-04 DIAGNOSIS — Z87820 Personal history of traumatic brain injury: Secondary | ICD-10-CM | POA: Diagnosis not present

## 2024-02-04 DIAGNOSIS — I959 Hypotension, unspecified: Secondary | ICD-10-CM | POA: Diagnosis not present

## 2024-02-04 DIAGNOSIS — G9341 Metabolic encephalopathy: Secondary | ICD-10-CM | POA: Diagnosis not present

## 2024-02-04 DIAGNOSIS — J690 Pneumonitis due to inhalation of food and vomit: Secondary | ICD-10-CM | POA: Diagnosis not present

## 2024-02-04 LAB — CBC
HCT: 43.3 % (ref 39.0–52.0)
Hemoglobin: 14.1 g/dL (ref 13.0–17.0)
MCH: 29.6 pg (ref 26.0–34.0)
MCHC: 32.6 g/dL (ref 30.0–36.0)
MCV: 90.8 fL (ref 80.0–100.0)
Platelets: 217 10*3/uL (ref 150–400)
RBC: 4.77 MIL/uL (ref 4.22–5.81)
RDW: 12.4 % (ref 11.5–15.5)
WBC: 4.5 10*3/uL (ref 4.0–10.5)
nRBC: 0 % (ref 0.0–0.2)

## 2024-02-04 LAB — BASIC METABOLIC PANEL WITH GFR
Anion gap: 11 (ref 5–15)
BUN: 18 mg/dL (ref 6–20)
CO2: 24 mmol/L (ref 22–32)
Calcium: 9.4 mg/dL (ref 8.9–10.3)
Chloride: 104 mmol/L (ref 98–111)
Creatinine, Ser: 0.39 mg/dL — ABNORMAL LOW (ref 0.61–1.24)
GFR, Estimated: >60 mL/min (ref >60)
Glucose, Bld: 85 mg/dL (ref 70–99)
Potassium: 3.9 mmol/L (ref 3.5–5.1)
Sodium: 139 mmol/L (ref 135–145)

## 2024-02-04 MED ORDER — ORAL CARE MOUTH RINSE
15.0000 mL | OROMUCOSAL | Status: DC
  Administered 2024-02-04 – 2024-02-16 (×47): 15 mL via OROMUCOSAL

## 2024-02-04 MED ORDER — ORAL CARE MOUTH RINSE: 15.0000 mL | OROMUCOSAL | Status: DC | PRN
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
## 2024-02-04 NOTE — Plan of Care (Signed)
  Problem: Clinical Measurements: Goal: Diagnostic test results will improve Outcome: Progressing Goal: Signs and symptoms of infection will decrease Outcome: Progressing   Problem: Respiratory: Goal: Ability to maintain adequate ventilation will improve Outcome: Progressing   Problem: Respiratory: Goal: Ability to maintain a clear airway and adequate ventilation will improve Outcome: Progressing   Problem: Clinical Measurements: Goal: Ability to maintain clinical measurements within normal limits will improve Outcome: Progressing Goal: Will remain free from infection Outcome: Progressing Goal: Diagnostic test results will improve Outcome: Progressing Goal: Respiratory complications will improve Outcome: Progressing Goal: Cardiovascular complication will be avoided Outcome: Progressing   Problem: Nutrition: Goal: Adequate nutrition will be maintained Outcome: Progressing   Problem: Elimination: Goal: Will not experience complications related to bowel motility Outcome: Progressing Goal: Will not experience complications related to urinary retention Outcome: Progressing   Problem: Fluid Volume: Goal: Hemodynamic stability will improve Outcome: Not Progressing   Problem: Activity: Goal: Ability to tolerate increased activity will improve Outcome: Not Progressing   Problem: Role Relationship: Goal: Method of communication will improve Outcome: Not Progressing   Problem: Education: Goal: Knowledge of General Education information will improve Description: Including pain rating scale, medication(s)/side effects and non-pharmacologic comfort measures Outcome: Not Progressing   Problem: Health Behavior/Discharge Planning: Goal: Ability to manage health-related needs will improve Outcome: Not Progressing   Problem: Activity: Goal: Risk for activity intolerance will decrease Outcome: Not Progressing   Problem: Coping: Goal: Level of anxiety will decrease Outcome:  Not Progressing   Problem: Pain Managment: Goal: General experience of comfort will improve and/or be controlled Outcome: Not Progressing   Problem: Safety: Goal: Ability to remain free from injury will improve Outcome: Not Progressing   Problem: Skin Integrity: Goal: Risk for impaired skin integrity will decrease Outcome: Not Progressing   Problem: Activity: Goal: Ability to tolerate increased activity will improve Outcome: Not Progressing   Problem: Respiratory: Goal: Ability to maintain a clear airway and adequate ventilation will improve Outcome: Not Progressing   Problem: Role Relationship: Goal: Method of communication will improve Outcome: Not Progressing

## 2024-02-04 NOTE — Plan of Care (Signed)
  Problem: Clinical Measurements: Goal: Ability to maintain clinical measurements within normal limits will improve Outcome: Progressing Goal: Will remain free from infection Outcome: Progressing Goal: Respiratory complications will improve Outcome: Progressing Goal: Cardiovascular complication will be avoided Outcome: Progressing   Problem: Nutrition: Goal: Adequate nutrition will be maintained Outcome: Progressing   Problem: Elimination: Goal: Will not experience complications related to bowel motility Outcome: Progressing Goal: Will not experience complications related to urinary retention Outcome: Progressing   Problem: Pain Managment: Goal: General experience of comfort will improve and/or be controlled Outcome: Progressing   Problem: Safety: Goal: Ability to remain free from injury will improve Outcome: Progressing   Problem: Skin Integrity: Goal: Risk for impaired skin integrity will decrease Outcome: Progressing   Problem: Respiratory: Goal: Ability to maintain a clear airway and adequate ventilation will improve Outcome: Progressing

## 2024-02-05 DIAGNOSIS — Z7189 Other specified counseling: Secondary | ICD-10-CM

## 2024-02-05 DIAGNOSIS — G9341 Metabolic encephalopathy: Secondary | ICD-10-CM | POA: Diagnosis not present

## 2024-02-05 DIAGNOSIS — J9601 Acute respiratory failure with hypoxia: Secondary | ICD-10-CM | POA: Diagnosis not present

## 2024-02-05 DIAGNOSIS — J690 Pneumonitis due to inhalation of food and vomit: Secondary | ICD-10-CM | POA: Diagnosis not present

## 2024-02-05 DIAGNOSIS — I959 Hypotension, unspecified: Secondary | ICD-10-CM | POA: Diagnosis not present

## 2024-02-05 NOTE — Plan of Care (Signed)
  Problem: Fluid Volume: Goal: Hemodynamic stability will improve Outcome: Progressing   Problem: Clinical Measurements: Goal: Diagnostic test results will improve Outcome: Progressing Goal: Signs and symptoms of infection will decrease Outcome: Progressing   Problem: Respiratory: Goal: Ability to maintain adequate ventilation will improve Outcome: Progressing   Problem: Activity: Goal: Ability to tolerate increased activity will improve Outcome: Progressing   Problem: Respiratory: Goal: Ability to maintain a clear airway and adequate ventilation will improve Outcome: Progressing   Problem: Role Relationship: Goal: Method of communication will improve Outcome: Progressing   Problem: Education: Goal: Knowledge of General Education information will improve Description: Including pain rating scale, medication(s)/side effects and non-pharmacologic comfort measures Outcome: Progressing   Problem: Health Behavior/Discharge Planning: Goal: Ability to manage health-related needs will improve Outcome: Progressing   Problem: Clinical Measurements: Goal: Ability to maintain clinical measurements within normal limits will improve Outcome: Progressing Goal: Will remain free from infection Outcome: Progressing Goal: Diagnostic test results will improve Outcome: Progressing Goal: Respiratory complications will improve Outcome: Progressing Goal: Cardiovascular complication will be avoided Outcome: Progressing   Problem: Activity: Goal: Risk for activity intolerance will decrease Outcome: Progressing   Problem: Nutrition: Goal: Adequate nutrition will be maintained Outcome: Progressing   Problem: Coping: Goal: Level of anxiety will decrease Outcome: Progressing   Problem: Elimination: Goal: Will not experience complications related to bowel motility Outcome: Progressing Goal: Will not experience complications related to urinary retention Outcome: Progressing   Problem:  Pain Managment: Goal: General experience of comfort will improve and/or be controlled Outcome: Progressing   Problem: Safety: Goal: Ability to remain free from injury will improve Outcome: Progressing   Problem: Skin Integrity: Goal: Risk for impaired skin integrity will decrease Outcome: Progressing   Problem: Activity: Goal: Ability to tolerate increased activity will improve Outcome: Progressing   Problem: Respiratory: Goal: Ability to maintain a clear airway and adequate ventilation will improve Outcome: Progressing   Problem: Role Relationship: Goal: Method of communication will improve Outcome: Progressing
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
## 2024-02-05 NOTE — Plan of Care (Signed)
  Problem: Fluid Volume: Goal: Hemodynamic stability will improve Outcome: Progressing   Problem: Clinical Measurements: Goal: Diagnostic test results will improve Outcome: Progressing Goal: Signs and symptoms of infection will decrease Outcome: Progressing   Problem: Respiratory: Goal: Ability to maintain adequate ventilation will improve Outcome: Progressing   Problem: Respiratory: Goal: Ability to maintain a clear airway and adequate ventilation will improve Outcome: Progressing   Problem: Clinical Measurements: Goal: Will remain free from infection Outcome: Progressing Goal: Respiratory complications will improve Outcome: Progressing Goal: Cardiovascular complication will be avoided Outcome: Progressing   Problem: Nutrition: Goal: Adequate nutrition will be maintained Outcome: Progressing   Problem: Elimination: Goal: Will not experience complications related to bowel motility Outcome: Progressing Goal: Will not experience complications related to urinary retention Outcome: Progressing   Problem: Pain Managment: Goal: General experience of comfort will improve and/or be controlled Outcome: Progressing   Problem: Safety: Goal: Ability to remain free from injury will improve Outcome: Progressing   Problem: Skin Integrity: Goal: Risk for impaired skin integrity will decrease Outcome: Progressing   Problem: Respiratory: Goal: Ability to maintain a clear airway and adequate ventilation will improve Outcome: Progressing

## 2024-02-06 DIAGNOSIS — I959 Hypotension, unspecified: Secondary | ICD-10-CM | POA: Diagnosis not present

## 2024-02-06 DIAGNOSIS — Z7189 Other specified counseling: Secondary | ICD-10-CM | POA: Diagnosis not present

## 2024-02-06 LAB — CBC
HCT: 39.6 % (ref 39.0–52.0)
Hemoglobin: 13 g/dL (ref 13.0–17.0)
MCH: 29.1 pg (ref 26.0–34.0)
MCHC: 32.8 g/dL (ref 30.0–36.0)
MCV: 88.8 fL (ref 80.0–100.0)
Platelets: 217 10*3/uL (ref 150–400)
RBC: 4.46 MIL/uL (ref 4.22–5.81)
RDW: 12.4 % (ref 11.5–15.5)
WBC: 4.1 10*3/uL (ref 4.0–10.5)
nRBC: 0 % (ref 0.0–0.2)

## 2024-02-06 LAB — BASIC METABOLIC PANEL WITH GFR
Anion gap: 8 (ref 5–15)
BUN: 19 mg/dL (ref 6–20)
CO2: 26 mmol/L (ref 22–32)
Calcium: 9.1 mg/dL (ref 8.9–10.3)
Chloride: 106 mmol/L (ref 98–111)
Creatinine, Ser: 0.41 mg/dL — ABNORMAL LOW (ref 0.61–1.24)
GFR, Estimated: >60 mL/min (ref >60)
Glucose, Bld: 100 mg/dL — ABNORMAL HIGH (ref 70–99)
Potassium: 3.8 mmol/L (ref 3.5–5.1)
Sodium: 140 mmol/L (ref 135–145)

## 2024-02-06 NOTE — Plan of Care (Signed)
  Problem: Fluid Volume: Goal: Hemodynamic stability will improve Outcome: Progressing   Problem: Clinical Measurements: Goal: Diagnostic test results will improve Outcome: Progressing Goal: Signs and symptoms of infection will decrease Outcome: Progressing   Problem: Respiratory: Goal: Ability to maintain adequate ventilation will improve Outcome: Progressing   Problem: Activity: Goal: Ability to tolerate increased activity will improve Outcome: Progressing   Problem: Respiratory: Goal: Ability to maintain a clear airway and adequate ventilation will improve Outcome: Progressing   Problem: Role Relationship: Goal: Method of communication will improve Outcome: Progressing   Problem: Education: Goal: Knowledge of General Education information will improve Description: Including pain rating scale, medication(s)/side effects and non-pharmacologic comfort measures Outcome: Progressing   Problem: Health Behavior/Discharge Planning: Goal: Ability to manage health-related needs will improve Outcome: Progressing   Problem: Clinical Measurements: Goal: Ability to maintain clinical measurements within normal limits will improve Outcome: Progressing Goal: Will remain free from infection Outcome: Progressing Goal: Diagnostic test results will improve Outcome: Progressing Goal: Respiratory complications will improve Outcome: Progressing Goal: Cardiovascular complication will be avoided Outcome: Progressing   Problem: Activity: Goal: Risk for activity intolerance will decrease Outcome: Progressing   Problem: Nutrition: Goal: Adequate nutrition will be maintained Outcome: Progressing   Problem: Coping: Goal: Level of anxiety will decrease Outcome: Progressing   Problem: Elimination: Goal: Will not experience complications related to bowel motility Outcome: Progressing Goal: Will not experience complications related to urinary retention Outcome: Progressing   Problem:  Pain Managment: Goal: General experience of comfort will improve and/or be controlled Outcome: Progressing   Problem: Safety: Goal: Ability to remain free from injury will improve Outcome: Progressing   Problem: Skin Integrity: Goal: Risk for impaired skin integrity will decrease Outcome: Progressing   Problem: Activity: Goal: Ability to tolerate increased activity will improve Outcome: Progressing   Problem: Respiratory: Goal: Ability to maintain a clear airway and adequate ventilation will improve Outcome: Progressing   Problem: Role Relationship: Goal: Method of communication will improve Outcome: Progressing

## 2024-02-06 NOTE — Progress Notes (Addendum)
 Daily Progress Note   Patient Name: Alex [name]       Date: 02/06/2024 DOB: 03-Jan-1996  Age: 28 y.o. MRN#: [id number] Attending Physician: Melodi Sprung, DO Primary Care Physician: [name], [facility] Admit Date: 11/19/2023  Reason for Consultation/Follow-up: Establishing goals of care  Subjective: Notes and labs reviewed.  In to see patient.  He is currently resting in bed, no distress noted.  Per updates, patient is ready for discharge with plans in place for trach training at his facility tomorrow, and then discharge to that facility.  Case discussed with attending.  Recommend outpatient palliative to follow for ongoing goals of care discussions as needed once he is tucked in at his facility.  Length of Stay: 69  Current Medications: Scheduled Meds:   baclofen  10 mg Per Tube TID   diltiazem  30 mg Per Tube Q6H   enoxaparin (LOVENOX) injection  40 mg Subcutaneous Q24H   feeding supplement (PROSource TF20)  60 mL Per Tube Daily   fiber supplement (BANATROL TF)  60 mL Per Tube BID   free water  30 mL Per Tube Q4H   gabapentin  300 mg Per Tube Q8H   Gerhardt's butt cream   Topical TID   lacosamide  100 mg Per Tube BID   metoprolol tartrate  25 mg Per Tube BID   midodrine  15 mg Per Tube TID WC   nutrition supplement (JUVEN)  1 packet Per Tube BID BM   omeprazole  20 mg Per Tube BID   mouth rinse  15 mL Mouth Rinse 4 times per day   QUEtiapine  50 mg Per Tube TID   valproic acid  500 mg Per Tube Daily   valproic acid  750 mg Per Tube QHS    Continuous Infusions:  feeding supplement (OSMOLITE 1.5 CAL) 1,000 mL (02/06/24 0945)    PRN Meds: acetaminophen **OR** acetaminophen, bisacodyl, glycopyrrolate, levalbuterol, lip balm, loperamide, metoprolol tartrate,  morphine injection, ondansetron **OR** ondansetron (ZOFRAN) IV, mouth rinse, mouth rinse, mouth rinse, mouth rinse, oxyCODONE  Physical Exam Pulmonary:     Effort: Pulmonary effort is normal.  Skin:    General: Skin is warm and dry.             Vital Signs: BP 125/67 (BP Location: Right Arm)   Pulse  89   Temp 97.9 F (36.6 C)   Resp 16   Ht 5' 9.02" (1.753 m)   Wt 68 kg   SpO2 100%   BMI 22.13 kg/m  SpO2: SpO2: 100 % O2 Device: O2 Device: Tracheostomy Collar O2 Flow Rate: O2 Flow Rate (L/min): 5 L/min  Intake/output summary:  Intake/Output Summary (Last 24 hours) at 02/06/2024 1003 Last data filed at 02/06/2024 0555 Gross per 24 hour  Intake 1246 ml  Output 1000 ml  Net 246 ml   LBM: Last BM Date : 02/05/24 Baseline Weight: Weight: 69.5 kg Most recent weight: Weight: 68 kg     Patient Active Problem List   Diagnosis Date Noted   Anemia, unspecified 01/08/2024   History of traumatic brain injury 12/20/2023   Hypotension 12/19/2023   Pressure injury of skin 11/26/2023   Acute hypoxic respiratory failure (HCC) 11/20/2023   Seizure disorder (HCC) 11/20/2023   Seizure (HCC) 09/11/2021   Status epilepticus (HCC) 05/31/2021   Traumatic brain injury (HCC) 05/31/2021   Malnutrition of moderate degree 05/31/2021    Palliative Care Assessment & Plan   Recommendations/Plan: Recommend outpatient palliative to follow for ongoing goals of care conversations as needed.  Code Status:    Code Status Orders  (From admission, onward)           Start     Ordered   11/20/23 0141  Full code  Continuous       Question:  By:  Answer:  Consent: discussion documented in EHR   11/20/23 0141           Code Status History     Date Active Date Inactive Code Status Order ID Comments User Context   11/19/2023 2322 11/20/2023 0141 Full Code [id number]  Mansy, Anastasio Kaska, MD ED   04/10/2022 2314 04/21/2022 0207 Full Code [id number]  Gleason, Curlee Doss Inpatient   09/11/2021  2328 09/14/2021 0207 Full Code [id number]  Mansy, Anastasio Kaska, MD ED   05/31/2021 0107 06/07/2021 0111 Full Code [id number]  Milas Alias, MD Inpatient       Care plan was discussed with attending via epic chat  Thank you for allowing the Palliative Medicine Team to assist in the care of this patient.   Meribeth Standard, NP  Please contact Palliative Medicine Team phone at [phone] for questions and concerns.

## 2024-02-06 NOTE — Progress Notes (Signed)
 TRACH CHANGED TO A 6.0 SHILEY FLEX CFS BY RT WITHOUT INCIDENT.
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
## 2024-02-07 DIAGNOSIS — J9601 Acute respiratory failure with hypoxia: Secondary | ICD-10-CM | POA: Diagnosis not present
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
## 2024-02-07 NOTE — Plan of Care (Signed)
   Problem: Fluid Volume: Goal: Hemodynamic stability will improve Outcome: Progressing

## 2024-02-07 NOTE — TOC Progression Note (Signed)
 Transition of Care [facility]) - Progression Note    Patient Details  Name: ALFIE RIDEAUX MRN: [id number] Date of Birth: 1996-07-31  [facility] of Care [facility] CM/SW Contact  Crayton Docker, RN 02/07/2024, 4:06 PM  Clinical Narrative:     Alert received from Dr. Authur Leghorn regarding SNF tracheostomy staff with [facility] CM follow up call to [facility],phone: [phone]. Per [facility], SNF tracheostomy training started today and will concluded in 2 weeks. CM alert to Dr. Authur Leghorn regarding SNF response.   Expected Discharge Plan: Skilled Nursing Facility Barriers to Discharge: Continued Medical Work up  Expected Discharge Plan and Services    SNF   Living arrangements for the past 2 months: Skilled Nursing Facility                  Social Determinants of Health (SDOH) Interventions SDOH Screenings   Food Insecurity: Patient Unable To Answer (11/20/2023)  Housing: Patient Unable To Answer (11/20/2023)  Transportation Needs: Patient Unable To Answer (11/20/2023)  Utilities: Patient Unable To Answer (11/20/2023)  Financial Resource Strain: Low Risk  (12/02/2021)   Received from [facility]  Tobacco Use: Low Risk  (11/19/2023)    Readmission Risk Interventions     No data to display

## 2024-02-08 DIAGNOSIS — J9601 Acute respiratory failure with hypoxia: Secondary | ICD-10-CM | POA: Diagnosis not present

## 2024-02-08 NOTE — Plan of Care (Signed)
  Problem: Fluid Volume: Goal: Hemodynamic stability will improve Outcome: Progressing   Problem: Clinical Measurements: Goal: Diagnostic test results will improve Outcome: Progressing   Problem: Respiratory: Goal: Ability to maintain adequate ventilation will improve Outcome: Progressing   Problem: Clinical Measurements: Goal: Ability to maintain clinical measurements within normal limits will improve Outcome: Progressing   Problem: Safety: Goal: Ability to remain free from injury will improve Outcome: Progressing   Problem: Skin Integrity: Goal: Risk for impaired skin integrity will decrease Outcome: Progressing

## 2024-02-08 NOTE — Plan of Care (Signed)
 PMT note: Patient ready for D/C, currently on day 80 of admission. Currently waiting for D/C to nursing facility once trach training has been completed. Care planning discussed with attending 4/16. PMT will sign off at this time. Please reconsult if needs arise.
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
## 2024-02-08 NOTE — Plan of Care (Signed)
  Problem: Fluid Volume: Goal: Hemodynamic stability will improve Outcome: Progressing   Problem: Respiratory: Goal: Ability to maintain adequate ventilation will improve Outcome: Progressing   Problem: Respiratory: Goal: Ability to maintain a clear airway and adequate ventilation will improve Outcome: Progressing   Problem: Clinical Measurements: Goal: Ability to maintain clinical measurements within normal limits will improve Outcome: Progressing Goal: Cardiovascular complication will be avoided Outcome: Progressing   Problem: Nutrition: Goal: Adequate nutrition will be maintained Outcome: Progressing   Problem: Coping: Goal: Level of anxiety will decrease Outcome: Progressing   Problem: Elimination: Goal: Will not experience complications related to bowel motility Outcome: Progressing Goal: Will not experience complications related to urinary retention Outcome: Progressing   Problem: Pain Managment: Goal: General experience of comfort will improve and/or be controlled Outcome: Progressing   Problem: Safety: Goal: Ability to remain free from injury will improve Outcome: Progressing   Problem: Skin Integrity: Goal: Risk for impaired skin integrity will decrease Outcome: Progressing

## 2024-02-08 NOTE — Plan of Care (Signed)

## 2024-02-09 NOTE — Plan of Care (Signed)
 Reposition patient, suctioned and swabbed patient to clear secretions. Spoke with patient to encourage coughing. No evidence of learning   Problem: Respiratory: Goal: Ability to maintain a clear airway and adequate ventilation will improve Outcome: Progressing   Problem: Respiratory: Goal: Ability to maintain adequate ventilation will improve Outcome: Not Progressing   Problem: Activity: Goal: Ability to tolerate increased activity will improve Outcome: Not Progressing

## 2024-02-09 NOTE — Plan of Care (Signed)
  Problem: Fluid Volume: Goal: Hemodynamic stability will improve Outcome: Progressing   Problem: Clinical Measurements: Goal: Diagnostic test results will improve Outcome: Progressing   Problem: Respiratory: Goal: Ability to maintain adequate ventilation will improve Outcome: Progressing   Problem: Elimination: Goal: Will not experience complications related to bowel motility Outcome: Progressing Goal: Will not experience complications related to urinary retention Outcome: Progressing   Problem: Safety: Goal: Ability to remain free from injury will improve Outcome: Progressing   Problem: Skin Integrity: Goal: Risk for impaired skin integrity will decrease Outcome: Progressing
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
## 2024-02-10 DIAGNOSIS — J9601 Acute respiratory failure with hypoxia: Secondary | ICD-10-CM | POA: Diagnosis not present

## 2024-02-10 NOTE — Plan of Care (Signed)
  Problem: Fluid Volume: Goal: Hemodynamic stability will improve Outcome: Progressing   Problem: Clinical Measurements: Goal: Diagnostic test results will improve Outcome: Progressing   Problem: Respiratory: Goal: Ability to maintain adequate ventilation will improve Outcome: Progressing   Problem: Respiratory: Goal: Ability to maintain a clear airway and adequate ventilation will improve Outcome: Progressing   Problem: Nutrition: Goal: Adequate nutrition will be maintained Outcome: Progressing   Problem: Elimination: Goal: Will not experience complications related to bowel motility Outcome: Progressing Goal: Will not experience complications related to urinary retention Outcome: Progressing   Problem: Pain Managment: Goal: General experience of comfort will improve and/or be controlled Outcome: Progressing   Problem: Safety: Goal: Ability to remain free from injury will improve Outcome: Progressing
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
## 2024-02-12 DIAGNOSIS — I959 Hypotension, unspecified: Secondary | ICD-10-CM | POA: Diagnosis not present

## 2024-02-12 NOTE — Plan of Care (Signed)
   Problem: Education: Goal: Knowledge of General Education information will improve Description: Including pain rating scale, medication(s)/side effects and non-pharmacologic comfort measures Outcome: Progressing   Problem: Safety: Goal: Ability to remain free from injury will improve Outcome: Progressing
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
## 2024-02-12 NOTE — Plan of Care (Signed)
  Problem: Pain Managment: Goal: General experience of comfort will improve and/or be controlled Outcome: Progressing   Problem: Safety: Goal: Ability to remain free from injury will improve Outcome: Progressing

## 2024-02-13 DIAGNOSIS — E861 Hypovolemia: Secondary | ICD-10-CM

## 2024-02-13 LAB — CBC
HCT: 42.1 % (ref 39.0–52.0)
Hemoglobin: 14.1 g/dL (ref 13.0–17.0)
MCH: 29.3 pg (ref 26.0–34.0)
MCHC: 33.5 g/dL (ref 30.0–36.0)
MCV: 87.3 fL (ref 80.0–100.0)
Platelets: 161 10*3/uL (ref 150–400)
RBC: 4.82 MIL/uL (ref 4.22–5.81)
RDW: 12.5 % (ref 11.5–15.5)
WBC: 6.1 10*3/uL (ref 4.0–10.5)
nRBC: 0 % (ref 0.0–0.2)

## 2024-02-13 LAB — BASIC METABOLIC PANEL WITH GFR
Anion gap: 10 (ref 5–15)
BUN: 18 mg/dL (ref 6–20)
CO2: 24 mmol/L (ref 22–32)
Calcium: 9.3 mg/dL (ref 8.9–10.3)
Chloride: 103 mmol/L (ref 98–111)
Creatinine, Ser: 0.55 mg/dL — ABNORMAL LOW (ref 0.61–1.24)
GFR, Estimated: >60 mL/min (ref >60)
Glucose, Bld: 120 mg/dL — ABNORMAL HIGH (ref 70–99)
Potassium: 4.3 mmol/L (ref 3.5–5.1)
Sodium: 137 mmol/L (ref 135–145)

## 2024-02-13 LAB — MAGNESIUM: Magnesium: 2.1 mg/dL (ref 1.7–2.4)

## 2024-02-13 NOTE — TOC Progression Note (Signed)
 Transition of Care [facility]) - Progression Note    Patient Details  Name: Alex [name] MRN: [id number] Date of Birth: 12-01-1995  Transition of Care [facility]) CM/SW Contact  Crayton Docker, RN Phone Number: 02/13/2024, 11:50 AM  Clinical Narrative:     No updates, Case Management will continue to follow for discharge care planning needs, with pending SNF placement after staff training completion at [facility]. Per Maurie Southern, Admissions, training started on 02/07/2024 and will take 2 weeks to have staff trained.   Expected Discharge Plan: Skilled Nursing Facility Barriers to Discharge: Continued Medical Work up  Expected Discharge Plan and Services      SNF Living arrangements for the past 2 months: Skilled Nursing Facility                    Social Determinants of Health (SDOH) Interventions SDOH Screenings   Food Insecurity: Patient Unable To Answer (11/20/2023)  Housing: Patient Unable To Answer (11/20/2023)  Transportation Needs: Patient Unable To Answer (11/20/2023)  Utilities: Patient Unable To Answer (11/20/2023)  Financial Resource Strain: Low Risk  (12/02/2021)   Received from [facility]  Tobacco Use: Low Risk  (11/19/2023)    Readmission Risk Interventions     No data to display
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
## 2024-02-13 NOTE — Plan of Care (Signed)
  Problem: Fluid Volume: Goal: Hemodynamic stability will improve Outcome: Not Progressing   Problem: Clinical Measurements: Goal: Diagnostic test results will improve Outcome: Not Progressing Goal: Signs and symptoms of infection will decrease Outcome: Not Progressing   Problem: Respiratory: Goal: Ability to maintain adequate ventilation will improve Outcome: Not Progressing   Problem: Activity: Goal: Ability to tolerate increased activity will improve Outcome: Not Progressing   Problem: Respiratory: Goal: Ability to maintain a clear airway and adequate ventilation will improve Outcome: Not Progressing   Problem: Role Relationship: Goal: Method of communication will improve Outcome: Not Progressing   Problem: Activity: Goal: Ability to tolerate increased activity will improve Outcome: Not Progressing   Problem: Respiratory: Goal: Ability to maintain a clear airway and adequate ventilation will improve Outcome: Not Progressing   Problem: Role Relationship: Goal: Method of communication will improve Outcome: Not Progressing
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
## 2024-02-14 DIAGNOSIS — E861 Hypovolemia: Secondary | ICD-10-CM | POA: Diagnosis not present
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
## 2024-02-15 DIAGNOSIS — E861 Hypovolemia: Secondary | ICD-10-CM | POA: Diagnosis not present

## 2024-02-15 MED ORDER — MIDODRINE HCL 5 MG PO TABS
5.0000 mg | ORAL_TABLET | Freq: Three times a day (TID) | ORAL | Status: DC
  Administered 2024-02-15 – 2024-02-19 (×14): 5 mg
  Filled 2024-02-15 (×15): qty 1
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
## 2024-02-15 NOTE — Plan of Care (Signed)

## 2024-02-15 NOTE — Plan of Care (Signed)
  Problem: Fluid Volume: Goal: Hemodynamic stability will improve Outcome: Not Progressing   Problem: Clinical Measurements: Goal: Diagnostic test results will improve Outcome: Not Progressing Goal: Signs and symptoms of infection will decrease Outcome: Not Progressing   Problem: Respiratory: Goal: Ability to maintain adequate ventilation will improve Outcome: Not Progressing   Problem: Role Relationship: Goal: Method of communication will improve Outcome: Not Progressing   Problem: Activity: Goal: Ability to tolerate increased activity will improve Outcome: Not Progressing   Problem: Respiratory: Goal: Ability to maintain a clear airway and adequate ventilation will improve Outcome: Not Progressing   Problem: Role Relationship: Goal: Method of communication will improve Outcome: Not Progressing

## 2024-02-16 DIAGNOSIS — E861 Hypovolemia: Secondary | ICD-10-CM | POA: Diagnosis not present
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
## 2024-02-16 NOTE — Plan of Care (Signed)
   Problem: Education: Goal: Knowledge of General Education information will improve Description: Including pain rating scale, medication(s)/side effects and non-pharmacologic comfort measures Outcome: Progressing   Problem: Activity: Goal: Risk for activity intolerance will decrease Outcome: Progressing   Problem: Nutrition: Goal: Adequate nutrition will be maintained Outcome: Progressing

## 2024-02-17 DIAGNOSIS — J690 Pneumonitis due to inhalation of food and vomit: Secondary | ICD-10-CM | POA: Diagnosis not present

## 2024-02-17 DIAGNOSIS — A419 Sepsis, unspecified organism: Secondary | ICD-10-CM | POA: Diagnosis not present

## 2024-02-17 DIAGNOSIS — J9601 Acute respiratory failure with hypoxia: Secondary | ICD-10-CM | POA: Diagnosis not present
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
## 2024-02-18 DIAGNOSIS — A419 Sepsis, unspecified organism: Secondary | ICD-10-CM | POA: Diagnosis not present

## 2024-02-18 DIAGNOSIS — J9601 Acute respiratory failure with hypoxia: Secondary | ICD-10-CM | POA: Diagnosis not present

## 2024-02-18 DIAGNOSIS — J690 Pneumonitis due to inhalation of food and vomit: Secondary | ICD-10-CM | POA: Diagnosis not present
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
## 2024-02-18 NOTE — Plan of Care (Signed)
  Problem: Fluid Volume: Goal: Hemodynamic stability will improve Outcome: Progressing   Problem: Respiratory: Goal: Ability to maintain adequate ventilation will improve Outcome: Progressing   
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
## 2024-02-19 DIAGNOSIS — A419 Sepsis, unspecified organism: Secondary | ICD-10-CM | POA: Diagnosis not present

## 2024-02-19 DIAGNOSIS — J690 Pneumonitis due to inhalation of food and vomit: Secondary | ICD-10-CM | POA: Diagnosis not present

## 2024-02-19 DIAGNOSIS — J9601 Acute respiratory failure with hypoxia: Secondary | ICD-10-CM | POA: Diagnosis not present
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
## 2024-02-20 DIAGNOSIS — I959 Hypotension, unspecified: Secondary | ICD-10-CM | POA: Diagnosis not present

## 2024-02-20 LAB — CBC WITH DIFFERENTIAL/PLATELET
Abs Immature Granulocytes: 0.01 10*3/uL (ref 0.00–0.07)
Basophils Absolute: 0 10*3/uL (ref 0.0–0.1)
Basophils Relative: 0 %
Eosinophils Absolute: 0.2 10*3/uL (ref 0.0–0.5)
Eosinophils Relative: 5 %
HCT: 42.3 % (ref 39.0–52.0)
Hemoglobin: 13.9 g/dL (ref 13.0–17.0)
Immature Granulocytes: 0 %
Lymphocytes Relative: 26 %
Lymphs Abs: 1.2 10*3/uL (ref 0.7–4.0)
MCH: 29 pg (ref 26.0–34.0)
MCHC: 32.9 g/dL (ref 30.0–36.0)
MCV: 88.1 fL (ref 80.0–100.0)
Monocytes Absolute: 0.4 10*3/uL (ref 0.1–1.0)
Monocytes Relative: 9 %
Neutro Abs: 2.7 10*3/uL (ref 1.7–7.7)
Neutrophils Relative %: 60 %
Platelets: UNDETERMINED 10*3/uL (ref 150–400)
RBC: 4.8 MIL/uL (ref 4.22–5.81)
RDW: 12.4 % (ref 11.5–15.5)
WBC: 4.5 10*3/uL (ref 4.0–10.5)
nRBC: 0 % (ref 0.0–0.2)

## 2024-02-20 LAB — BASIC METABOLIC PANEL WITH GFR
Anion gap: 4 — ABNORMAL LOW (ref 5–15)
BUN: 16 mg/dL (ref 6–20)
CO2: 26 mmol/L (ref 22–32)
Calcium: 8.7 mg/dL — ABNORMAL LOW (ref 8.9–10.3)
Chloride: 106 mmol/L (ref 98–111)
Creatinine, Ser: 0.48 mg/dL — ABNORMAL LOW (ref 0.61–1.24)
GFR, Estimated: >60 mL/min (ref >60)
Glucose, Bld: 93 mg/dL (ref 70–99)
Potassium: 4 mmol/L (ref 3.5–5.1)
Sodium: 136 mmol/L (ref 135–145)

## 2024-02-20 MED ORDER — MIDODRINE HCL 5 MG PO TABS
5.0000 mg | ORAL_TABLET | Freq: Two times a day (BID) | ORAL | Status: DC
  Administered 2024-02-20 – 2024-02-21 (×3): 5 mg
  Filled 2024-02-20 (×3): qty 1

## 2024-02-20 NOTE — Plan of Care (Signed)
  Problem: Fluid Volume: Goal: Hemodynamic stability will improve Outcome: Progressing   Problem: Clinical Measurements: Goal: Diagnostic test results will improve Outcome: Progressing Goal: Signs and symptoms of infection will decrease Outcome: Progressing   Problem: Respiratory: Goal: Ability to maintain adequate ventilation will improve Outcome: Progressing   Problem: Activity: Goal: Ability to tolerate increased activity will improve Outcome: Progressing   Problem: Respiratory: Goal: Ability to maintain a clear airway and adequate ventilation will improve Outcome: Progressing   Problem: Activity: Goal: Ability to tolerate increased activity will improve Outcome: Progressing   Problem: Respiratory: Goal: Ability to maintain a clear airway and adequate ventilation will improve Outcome: Progressing   Problem: Role Relationship: Goal: Method of communication will improve Outcome: Progressing   Problem: Health Behavior/Discharge Planning: Goal: Ability to manage health-related needs will improve Outcome: Progressing   Problem: Clinical Measurements: Goal: Ability to maintain clinical measurements within normal limits will improve Outcome: Progressing Goal: Will remain free from infection Outcome: Progressing Goal: Diagnostic test results will improve Outcome: Progressing Goal: Respiratory complications will improve Outcome: Progressing Goal: Cardiovascular complication will be avoided Outcome: Progressing
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
## 2024-02-20 NOTE — Plan of Care (Signed)
  Problem: Fluid Volume: Goal: Hemodynamic stability will improve Outcome: Progressing   Problem: Clinical Measurements: Goal: Diagnostic test results will improve Outcome: Progressing Goal: Signs and symptoms of infection will decrease Outcome: Progressing   Problem: Respiratory: Goal: Ability to maintain adequate ventilation will improve Outcome: Progressing   Problem: Activity: Goal: Ability to tolerate increased activity will improve Outcome: Progressing   Problem: Respiratory: Goal: Ability to maintain a clear airway and adequate ventilation will improve Outcome: Progressing   Problem: Role Relationship: Goal: Method of communication will improve Outcome: Progressing   Problem: Activity: Goal: Ability to tolerate increased activity will improve Outcome: Progressing   Problem: Respiratory: Goal: Ability to maintain a clear airway and adequate ventilation will improve Outcome: Progressing   Problem: Role Relationship: Goal: Method of communication will improve Outcome: Progressing   Problem: Education: Goal: Knowledge of General Education information will improve Description: Including pain rating scale, medication(s)/side effects and non-pharmacologic comfort measures Outcome: Progressing   Problem: Health Behavior/Discharge Planning: Goal: Ability to manage health-related needs will improve Outcome: Progressing   Problem: Clinical Measurements: Goal: Ability to maintain clinical measurements within normal limits will improve Outcome: Progressing Goal: Will remain free from infection Outcome: Progressing Goal: Diagnostic test results will improve Outcome: Progressing Goal: Respiratory complications will improve Outcome: Progressing Goal: Cardiovascular complication will be avoided Outcome: Progressing   Problem: Activity: Goal: Risk for activity intolerance will decrease Outcome: Progressing   Problem: Nutrition: Goal: Adequate nutrition will be  maintained Outcome: Progressing   Problem: Coping: Goal: Level of anxiety will decrease Outcome: Progressing   Problem: Elimination: Goal: Will not experience complications related to bowel motility Outcome: Progressing Goal: Will not experience complications related to urinary retention Outcome: Progressing   Problem: Pain Managment: Goal: General experience of comfort will improve and/or be controlled Outcome: Progressing   Problem: Safety: Goal: Ability to remain free from injury will improve Outcome: Progressing   Problem: Skin Integrity: Goal: Risk for impaired skin integrity will decrease Outcome: Progressing

## 2024-02-21 DIAGNOSIS — I959 Hypotension, unspecified: Secondary | ICD-10-CM | POA: Diagnosis not present

## 2024-02-21 NOTE — Plan of Care (Signed)
  Problem: Fluid Volume: Goal: Hemodynamic stability will improve Outcome: Progressing   Problem: Clinical Measurements: Goal: Diagnostic test results will improve Outcome: Progressing Goal: Signs and symptoms of infection will decrease Outcome: Progressing   Problem: Respiratory: Goal: Ability to maintain adequate ventilation will improve Outcome: Progressing   Problem: Activity: Goal: Ability to tolerate increased activity will improve Outcome: Progressing   Problem: Respiratory: Goal: Ability to maintain a clear airway and adequate ventilation will improve Outcome: Progressing   Problem: Role Relationship: Goal: Method of communication will improve Outcome: Progressing   Problem: Education: Goal: Knowledge of General Education information will improve Description: Including pain rating scale, medication(s)/side effects and non-pharmacologic comfort measures Outcome: Progressing   Problem: Health Behavior/Discharge Planning: Goal: Ability to manage health-related needs will improve Outcome: Progressing   Problem: Clinical Measurements: Goal: Ability to maintain clinical measurements within normal limits will improve Outcome: Progressing Goal: Will remain free from infection Outcome: Progressing Goal: Diagnostic test results will improve Outcome: Progressing Goal: Respiratory complications will improve Outcome: Progressing Goal: Cardiovascular complication will be avoided Outcome: Progressing   Problem: Activity: Goal: Risk for activity intolerance will decrease Outcome: Progressing   Problem: Nutrition: Goal: Adequate nutrition will be maintained Outcome: Progressing   Problem: Coping: Goal: Level of anxiety will decrease Outcome: Progressing   Problem: Elimination: Goal: Will not experience complications related to bowel motility Outcome: Progressing Goal: Will not experience complications related to urinary retention Outcome: Progressing   Problem:  Pain Managment: Goal: General experience of comfort will improve and/or be controlled Outcome: Progressing   Problem: Safety: Goal: Ability to remain free from injury will improve Outcome: Progressing   Problem: Skin Integrity: Goal: Risk for impaired skin integrity will decrease Outcome: Progressing   Problem: Activity: Goal: Ability to tolerate increased activity will improve Outcome: Progressing   Problem: Respiratory: Goal: Ability to maintain a clear airway and adequate ventilation will improve Outcome: Progressing   Problem: Role Relationship: Goal: Method of communication will improve Outcome: Progressing

## 2024-02-21 NOTE — TOC Progression Note (Signed)
 Transition of Care [facility]) - Progression Note    Patient Details  Name: Alex [name] MRN: [id number] Date of Birth: 1996-02-12  [facility]) CM/SW Contact  Crayton Docker, RN Phone Number: 02/21/2024, 12:26 PM  Clinical Narrative:     CM follow up call to Maurie Southern, Admissions,  [facility]. Per Maurie Southern, Admissions, SNF is requiring an additional 2 weeks of training as they await clearance from Corporate Office on the go ahead to accept patient back to SNF. Per Maurie Southern, Admissions looking at date of patient's return, 03/07/2024.  CM alert to Dr. Mosetta Areola regarding SNF response.   Expected Discharge Plan: Skilled Nursing Facility Barriers to Discharge: Continued Medical Work up  Expected Discharge Plan and Services    SNF   Living arrangements for the past 2 months: Skilled Nursing Facility                  SNF  Social Determinants of Health (SDOH) Interventions SDOH Screenings   Food Insecurity: Patient Unable To Answer (11/20/2023)  Housing: Patient Unable To Answer (11/20/2023)  Transportation Needs: Patient Unable To Answer (11/20/2023)  Utilities: Patient Unable To Answer (11/20/2023)  Financial Resource Strain: Low Risk  (12/02/2021)   Received from [facility]  Tobacco Use: Low Risk  (11/19/2023)    Readmission Risk Interventions     No data to display
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
## 2024-02-22 DIAGNOSIS — I959 Hypotension, unspecified: Secondary | ICD-10-CM | POA: Diagnosis not present
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
## 2024-02-22 NOTE — Plan of Care (Signed)
  Problem: Respiratory: Goal: Ability to maintain adequate ventilation will improve Outcome: Progressing   Problem: Activity: Goal: Ability to tolerate increased activity will improve Outcome: Progressing   Problem: Respiratory: Goal: Ability to maintain a clear airway and adequate ventilation will improve Outcome: Progressing   Problem: Clinical Measurements: Goal: Ability to maintain clinical measurements within normal limits will improve Outcome: Progressing Goal: Cardiovascular complication will be avoided Outcome: Progressing   Problem: Activity: Goal: Risk for activity intolerance will decrease Outcome: Progressing   Problem: Nutrition: Goal: Adequate nutrition will be maintained Outcome: Progressing   Problem: Coping: Goal: Level of anxiety will decrease Outcome: Progressing   Problem: Elimination: Goal: Will not experience complications related to bowel motility Outcome: Progressing Goal: Will not experience complications related to urinary retention Outcome: Progressing   Problem: Pain Managment: Goal: General experience of comfort will improve and/or be controlled Outcome: Progressing   Problem: Safety: Goal: Ability to remain free from injury will improve Outcome: Progressing   Problem: Skin Integrity: Goal: Risk for impaired skin integrity will decrease Outcome: Progressing

## 2024-02-23 DIAGNOSIS — I959 Hypotension, unspecified: Secondary | ICD-10-CM | POA: Diagnosis not present

## 2024-02-23 NOTE — Plan of Care (Signed)
   Problem: Education: Goal: Knowledge of General Education information will improve Description: Including pain rating scale, medication(s)/side effects and non-pharmacologic comfort measures Outcome: Progressing   Problem: Nutrition: Goal: Adequate nutrition will be maintained Outcome: Progressing   Problem: Safety: Goal: Ability to remain free from injury will improve Outcome: Progressing
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
## 2024-02-24 DIAGNOSIS — I959 Hypotension, unspecified: Secondary | ICD-10-CM | POA: Diagnosis not present
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
## 2024-02-25 DIAGNOSIS — I959 Hypotension, unspecified: Secondary | ICD-10-CM | POA: Diagnosis not present

## 2024-02-25 NOTE — Plan of Care (Signed)
   Problem: Education: Goal: Knowledge of General Education information will improve Description: Including pain rating scale, medication(s)/side effects and non-pharmacologic comfort measures Outcome: Progressing   Problem: Nutrition: Goal: Adequate nutrition will be maintained Outcome: Progressing   Problem: Safety: Goal: Ability to remain free from injury will improve Outcome: Progressing
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
## 2024-02-26 DIAGNOSIS — I959 Hypotension, unspecified: Secondary | ICD-10-CM | POA: Diagnosis not present

## 2024-02-26 NOTE — Plan of Care (Signed)
  Problem: Fluid Volume: Goal: Hemodynamic stability will improve Outcome: Progressing   Problem: Respiratory: Goal: Ability to maintain adequate ventilation will improve Outcome: Progressing   Problem: Activity: Goal: Ability to tolerate increased activity will improve Outcome: Progressing
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
## 2024-02-26 NOTE — Plan of Care (Signed)

## 2024-02-27 DIAGNOSIS — A419 Sepsis, unspecified organism: Secondary | ICD-10-CM | POA: Diagnosis not present

## 2024-02-27 DIAGNOSIS — E876 Hypokalemia: Secondary | ICD-10-CM | POA: Diagnosis not present

## 2024-02-27 DIAGNOSIS — I959 Hypotension, unspecified: Secondary | ICD-10-CM | POA: Diagnosis not present

## 2024-02-27 DIAGNOSIS — G9341 Metabolic encephalopathy: Secondary | ICD-10-CM | POA: Diagnosis not present

## 2024-02-27 LAB — CBC WITH DIFFERENTIAL/PLATELET
Abs Immature Granulocytes: 0.02 K/uL (ref 0.00–0.07)
Basophils Absolute: 0 K/uL (ref 0.0–0.1)
Basophils Relative: 0 %
Eosinophils Absolute: 0.2 K/uL (ref 0.0–0.5)
Eosinophils Relative: 4 %
HCT: 39.8 % (ref 39.0–52.0)
Hemoglobin: 13.1 g/dL (ref 13.0–17.0)
Immature Granulocytes: 1 %
Lymphocytes Relative: 35 %
Lymphs Abs: 1.4 K/uL (ref 0.7–4.0)
MCH: 28.9 pg (ref 26.0–34.0)
MCHC: 32.9 g/dL (ref 30.0–36.0)
MCV: 87.7 fL (ref 80.0–100.0)
Monocytes Absolute: 0.3 K/uL (ref 0.1–1.0)
Monocytes Relative: 7 %
Neutro Abs: 2.1 K/uL (ref 1.7–7.7)
Neutrophils Relative %: 53 %
Platelets: 214 K/uL (ref 150–400)
RBC: 4.54 MIL/uL (ref 4.22–5.81)
RDW: 12.4 % (ref 11.5–15.5)
WBC: 4 K/uL (ref 4.0–10.5)
nRBC: 0 % (ref 0.0–0.2)

## 2024-02-27 LAB — BASIC METABOLIC PANEL WITH GFR
Anion gap: 11 (ref 5–15)
BUN: 19 mg/dL (ref 6–20)
CO2: 27 mmol/L (ref 22–32)
Calcium: 9.1 mg/dL (ref 8.9–10.3)
Chloride: 103 mmol/L (ref 98–111)
Creatinine, Ser: 0.43 mg/dL — ABNORMAL LOW (ref 0.61–1.24)
GFR, Estimated: >60 mL/min (ref >60)
Glucose, Bld: 102 mg/dL — ABNORMAL HIGH (ref 70–99)
Potassium: 3.8 mmol/L (ref 3.5–5.1)
Sodium: 141 mmol/L (ref 135–145)

## 2024-02-27 NOTE — Plan of Care (Signed)
   Problem: Education: Goal: Knowledge of General Education information will improve Description: Including pain rating scale, medication(s)/side effects and non-pharmacologic comfort measures Outcome: Progressing   Problem: Nutrition: Goal: Adequate nutrition will be maintained Outcome: Progressing   Problem: Safety: Goal: Ability to remain free from injury will improve Outcome: Progressing
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
## 2024-02-28 DIAGNOSIS — E876 Hypokalemia: Secondary | ICD-10-CM | POA: Diagnosis not present

## 2024-02-28 DIAGNOSIS — G9341 Metabolic encephalopathy: Secondary | ICD-10-CM | POA: Diagnosis not present

## 2024-02-28 DIAGNOSIS — I959 Hypotension, unspecified: Secondary | ICD-10-CM | POA: Diagnosis not present

## 2024-02-28 DIAGNOSIS — D75838 Other thrombocytosis: Secondary | ICD-10-CM | POA: Diagnosis not present
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
## 2024-02-28 NOTE — TOC Progression Note (Signed)
 Transition of Care [facility]) - Progression Note    Patient Details  Name: Alex [name] MRN: [id number] Date of Birth: 04-06-1996  Transition of Care [facility]) CM/SW Contact  Crayton Docker, RN 02/28/2024, 11:44 AM  Clinical Narrative:     CM follow up [name] placed to Shay, Admissions, [facility] regarding status of patient return awaiting clearance from Corporate Office of [facility]. No answer, CM left message for return [name].   Expected Discharge Plan: Skilled Nursing Facility Barriers to Discharge: Continued Medical Work up  Expected Discharge Plan and Services    Return to prior facility   Living arrangements for the past 2 months: Skilled Nursing Facility                  Social Determinants of Health (SDOH) Interventions SDOH Screenings   Food Insecurity: Patient Unable To Answer (11/20/2023)  Housing: Patient Unable To Answer (11/20/2023)  Transportation Needs: Patient Unable To Answer (11/20/2023)  Utilities: Patient Unable To Answer (11/20/2023)  Financial Resource Strain: Low Risk  (12/02/2021)   Received from [facility]  Tobacco Use: Low Risk  (11/19/2023)    Readmission Risk Interventions     No data to display
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
## 2024-02-29 DIAGNOSIS — A419 Sepsis, unspecified organism: Secondary | ICD-10-CM | POA: Diagnosis not present

## 2024-02-29 DIAGNOSIS — I959 Hypotension, unspecified: Secondary | ICD-10-CM | POA: Diagnosis not present

## 2024-02-29 DIAGNOSIS — G9341 Metabolic encephalopathy: Secondary | ICD-10-CM | POA: Diagnosis not present

## 2024-02-29 DIAGNOSIS — D75838 Other thrombocytosis: Secondary | ICD-10-CM | POA: Diagnosis not present
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
## 2024-03-01 DIAGNOSIS — A419 Sepsis, unspecified organism: Secondary | ICD-10-CM | POA: Diagnosis not present

## 2024-03-01 DIAGNOSIS — D75838 Other thrombocytosis: Secondary | ICD-10-CM | POA: Diagnosis not present

## 2024-03-01 DIAGNOSIS — Z87820 Personal history of traumatic brain injury: Secondary | ICD-10-CM | POA: Diagnosis not present

## 2024-03-01 DIAGNOSIS — I959 Hypotension, unspecified: Secondary | ICD-10-CM | POA: Diagnosis not present
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
## 2024-03-02 DIAGNOSIS — E876 Hypokalemia: Secondary | ICD-10-CM | POA: Diagnosis not present

## 2024-03-02 DIAGNOSIS — G40909 Epilepsy, unspecified, not intractable, without status epilepticus: Secondary | ICD-10-CM | POA: Diagnosis not present

## 2024-03-02 DIAGNOSIS — D75838 Other thrombocytosis: Secondary | ICD-10-CM | POA: Diagnosis not present

## 2024-03-02 DIAGNOSIS — I959 Hypotension, unspecified: Secondary | ICD-10-CM | POA: Diagnosis not present

## 2024-03-02 LAB — GLUCOSE, CAPILLARY: Glucose-Capillary: 109 mg/dL — ABNORMAL HIGH (ref 70–99)

## 2024-03-02 NOTE — Plan of Care (Signed)
  Problem: Fluid Volume: Goal: Hemodynamic stability will improve Outcome: Progressing   Problem: Clinical Measurements: Goal: Diagnostic test results will improve Outcome: Progressing Goal: Signs and symptoms of infection will decrease Outcome: Progressing   Problem: Respiratory: Goal: Ability to maintain adequate ventilation will improve Outcome: Progressing   Problem: Respiratory: Goal: Ability to maintain a clear airway and adequate ventilation will improve Outcome: Progressing   Problem: Clinical Measurements: Goal: Ability to maintain clinical measurements within normal limits will improve Outcome: Progressing Goal: Will remain free from infection Outcome: Progressing Goal: Diagnostic test results will improve Outcome: Progressing Goal: Respiratory complications will improve Outcome: Progressing Goal: Cardiovascular complication will be avoided Outcome: Progressing   Problem: Activity: Goal: Risk for activity intolerance will decrease Outcome: Progressing   Problem: Nutrition: Goal: Adequate nutrition will be maintained Outcome: Progressing   Problem: Coping: Goal: Level of anxiety will decrease Outcome: Progressing   Problem: Elimination: Goal: Will not experience complications related to bowel motility Outcome: Progressing Goal: Will not experience complications related to urinary retention Outcome: Progressing   Problem: Pain Managment: Goal: General experience of comfort will improve and/or be controlled Outcome: Progressing   Problem: Safety: Goal: Ability to remain free from injury will improve Outcome: Progressing   Problem: Skin Integrity: Goal: Risk for impaired skin integrity will decrease Outcome: Progressing   Problem: Respiratory: Goal: Ability to maintain a clear airway and adequate ventilation will improve Outcome: Progressing
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
## 2024-03-03 DIAGNOSIS — J690 Pneumonitis due to inhalation of food and vomit: Secondary | ICD-10-CM | POA: Diagnosis not present

## 2024-03-03 DIAGNOSIS — I959 Hypotension, unspecified: Secondary | ICD-10-CM | POA: Diagnosis not present

## 2024-03-03 DIAGNOSIS — E876 Hypokalemia: Secondary | ICD-10-CM | POA: Diagnosis not present

## 2024-03-03 DIAGNOSIS — D75838 Other thrombocytosis: Secondary | ICD-10-CM | POA: Diagnosis not present

## 2024-03-03 LAB — GLUCOSE, CAPILLARY
Glucose-Capillary: 142 mg/dL — ABNORMAL HIGH (ref 70–99)
Glucose-Capillary: 125 mg/dL — ABNORMAL HIGH (ref 70–99)

## 2024-03-03 NOTE — Progress Notes (Signed)
 Plan of care is reviewed. Pt is progressing. He is afebrile, hemodynamically stable, on 5LPM of trach collar, normal respiratory effort, no acute distress noted overnight. We will monitor.  Brain Cahill, RN
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
## 2024-03-04 DIAGNOSIS — I959 Hypotension, unspecified: Secondary | ICD-10-CM | POA: Diagnosis not present

## 2024-03-04 DIAGNOSIS — G40909 Epilepsy, unspecified, not intractable, without status epilepticus: Secondary | ICD-10-CM | POA: Diagnosis not present

## 2024-03-04 DIAGNOSIS — Z87820 Personal history of traumatic brain injury: Secondary | ICD-10-CM | POA: Diagnosis not present

## 2024-03-04 DIAGNOSIS — D75838 Other thrombocytosis: Secondary | ICD-10-CM | POA: Diagnosis not present
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
## 2024-03-04 NOTE — Progress Notes (Signed)
   03/04/24 2038  Assess: MEWS Score  Temp 98.3 F (36.8 C)  BP (!) 139/99  MAP (mmHg) 110  Pulse Rate (!) 122  Resp 19  SpO2 96 %  O2 Device Tracheostomy Collar  Assess: MEWS Score  MEWS Temp 0  MEWS Systolic 0  MEWS Pulse 2  MEWS RR 0  MEWS LOC 0  MEWS Score 2  MEWS Score Color Yellow  Assess: if the MEWS score is Yellow or Red  Were vital signs accurate and taken at a resting state? Yes  Does the patient meet 2 or more of the SIRS criteria? No  Does the patient have a confirmed or suspected source of infection? No  MEWS guidelines implemented  Yes, yellow  Treat  MEWS Interventions Considered administering scheduled or prn medications/treatments as ordered  Take Vital Signs  Increase Vital Sign Frequency  Yellow: Q2hr x1, continue Q4hrs until patient remains green for 12hrs  Escalate  MEWS: Escalate Yellow: Discuss with charge nurse and consider notifying provider and/or RRT  Notify: Charge Nurse/RN  Name of Charge Nurse/RN Notified Woodroe Hazel  Assess: SIRS CRITERIA  SIRS Temperature  0  SIRS Respirations  0  SIRS Pulse 1  SIRS WBC 0  SIRS Score Sum  1   Patient's heart rate is elevated, will receive a bedtime dose of metoprolol .
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
## 2024-03-04 NOTE — TOC Progression Note (Signed)
 Transition of Care [facility]) - Progression Note    Patient Details  Name: Alex [name] MRN: [id number] Date of Birth: [birth year]  Transition of [facility]) CM/SW Contact  Alexandra Ice, RN Phone Number: 03/04/2024, 12:46 PM  Clinical Narrative:     Ricky Charter message to Ivette Marks with [facility] inquiring on update on status of status training, awaiting response.   Expected Discharge Plan: Skilled Nursing Facility Barriers to Discharge: Continued Medical Work up  Expected Discharge Plan and Services       Living arrangements for the past 2 months: Skilled Nursing Facility                                       Social Determinants of Health (SDOH) Interventions SDOH Screenings   Food Insecurity: Patient Unable To Answer (11/20/2023)  Housing: Patient Unable To Answer (11/20/2023)  Transportation Needs: Patient Unable To Answer (11/20/2023)  Utilities: Patient Unable To Answer (11/20/2023)  Financial Resource Strain: Low Risk  (12/02/2021)   [facility]  Tobacco Use: Low Risk  (11/19/2023)    Readmission Risk Interventions     No data to display

## 2024-03-04 NOTE — Plan of Care (Signed)
  Problem: Fluid Volume: Goal: Hemodynamic stability will improve Outcome: Progressing   Problem: Clinical Measurements: Goal: Signs and symptoms of infection will decrease Outcome: Progressing   Problem: Respiratory: Goal: Ability to maintain adequate ventilation will improve Outcome: Progressing   Problem: Respiratory: Goal: Ability to maintain a clear airway and adequate ventilation will improve Outcome: Progressing   Problem: Clinical Measurements: Goal: Ability to maintain clinical measurements within normal limits will improve Outcome: Progressing Goal: Will remain free from infection Outcome: Progressing Goal: Respiratory complications will improve Outcome: Progressing Goal: Cardiovascular complication will be avoided Outcome: Progressing   Problem: Nutrition: Goal: Adequate nutrition will be maintained Outcome: Progressing   Problem: Elimination: Goal: Will not experience complications related to bowel motility Outcome: Progressing Goal: Will not experience complications related to urinary retention Outcome: Progressing   Problem: Pain Managment: Goal: General experience of comfort will improve and/or be controlled Outcome: Progressing   Problem: Safety: Goal: Ability to remain free from injury will improve Outcome: Progressing   Problem: Skin Integrity: Goal: Risk for impaired skin integrity will decrease Outcome: Progressing   Problem: Respiratory: Goal: Ability to maintain a clear airway and adequate ventilation will improve Outcome: Progressing

## 2024-03-05 DIAGNOSIS — I959 Hypotension, unspecified: Secondary | ICD-10-CM | POA: Diagnosis not present

## 2024-03-05 LAB — BASIC METABOLIC PANEL WITH GFR
Anion gap: 9 (ref 5–15)
BUN: 20 mg/dL (ref 6–20)
CO2: 24 mmol/L (ref 22–32)
Calcium: 8.9 mg/dL (ref 8.9–10.3)
Chloride: 106 mmol/L (ref 98–111)
Creatinine, Ser: 0.56 mg/dL — ABNORMAL LOW (ref 0.61–1.24)
GFR, Estimated: >60 mL/min (ref >60)
Glucose, Bld: 82 mg/dL (ref 70–99)
Potassium: 4.1 mmol/L (ref 3.5–5.1)
Sodium: 139 mmol/L (ref 135–145)

## 2024-03-05 LAB — CBC
HCT: 41.9 % (ref 39.0–52.0)
Hemoglobin: 13.4 g/dL (ref 13.0–17.0)
MCH: 28.2 pg (ref 26.0–34.0)
MCHC: 32 g/dL (ref 30.0–36.0)
MCV: 88.2 fL (ref 80.0–100.0)
Platelets: 231 10*3/uL (ref 150–400)
RBC: 4.75 MIL/uL (ref 4.22–5.81)
RDW: 12.3 % (ref 11.5–15.5)
WBC: 3.9 10*3/uL — ABNORMAL LOW (ref 4.0–10.5)
nRBC: 0 % (ref 0.0–0.2)

## 2024-03-05 NOTE — TOC Progression Note (Signed)
 Transition of Care [facility]) - Progression Note    Patient Details  Name: Alex [name] MRN: [id number] Date of Birth: 09/06/1996  Transition of Care [facility]) CM/SW Contact  Alexandra Ice, RN Phone Number: 03/05/2024, 2:28 PM  Clinical Narrative:      Reached out to facility to get update, stated they should be ready for patient to admit early next week. Expected Discharge Plan: Skilled Nursing Facility Barriers to Discharge: Continued Medical Work up  Expected Discharge Plan and Services       Living arrangements for the past 2 months: Skilled Nursing Facility                                       Social Determinants of Health (SDOH) Interventions SDOH Screenings   Food Insecurity: Patient Unable To Answer (11/20/2023)  Housing: Patient Unable To Answer (11/20/2023)  Transportation Needs: Patient Unable To Answer (11/20/2023)  Utilities: Patient Unable To Answer (11/20/2023)  Financial Resource Strain: Low Risk  (12/02/2021)   Received from [facility]  Tobacco Use: Low Risk  (11/19/2023)    Readmission Risk Interventions     No data to display
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
## 2024-03-06 DIAGNOSIS — I959 Hypotension, unspecified: Secondary | ICD-10-CM | POA: Diagnosis not present
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
## 2024-03-06 NOTE — Plan of Care (Signed)
  Problem: Fluid Volume: Goal: Hemodynamic stability will improve Outcome: Progressing   Problem: Clinical Measurements: Goal: Signs and symptoms of infection will decrease Outcome: Progressing   Problem: Respiratory: Goal: Ability to maintain adequate ventilation will improve Outcome: Progressing   

## 2024-03-07 DIAGNOSIS — I959 Hypotension, unspecified: Secondary | ICD-10-CM | POA: Diagnosis not present
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
## 2024-03-07 NOTE — Plan of Care (Signed)
  Problem: Fluid Volume: Goal: Hemodynamic stability will improve Outcome: Progressing   Problem: Clinical Measurements: Goal: Diagnostic test results will improve Outcome: Progressing Goal: Signs and symptoms of infection will decrease Outcome: Progressing   Problem: Respiratory: Goal: Ability to maintain adequate ventilation will improve Outcome: Progressing   Problem: Activity: Goal: Ability to tolerate increased activity will improve Outcome: Progressing   Problem: Respiratory: Goal: Ability to maintain a clear airway and adequate ventilation will improve Outcome: Progressing   Problem: Role Relationship: Goal: Method of communication will improve Outcome: Progressing   Problem: Clinical Measurements: Goal: Ability to maintain clinical measurements within normal limits will improve Outcome: Progressing Goal: Will remain free from infection Outcome: Progressing Goal: Diagnostic test results will improve Outcome: Progressing Goal: Respiratory complications will improve Outcome: Progressing Goal: Cardiovascular complication will be avoided Outcome: Progressing   Problem: Activity: Goal: Risk for activity intolerance will decrease Outcome: Progressing   Problem: Nutrition: Goal: Adequate nutrition will be maintained Outcome: Progressing   Problem: Coping: Goal: Level of anxiety will decrease Outcome: Progressing   Problem: Elimination: Goal: Will not experience complications related to bowel motility Outcome: Progressing Goal: Will not experience complications related to urinary retention Outcome: Progressing   Problem: Pain Managment: Goal: General experience of comfort will improve and/or be controlled Outcome: Progressing   Problem: Safety: Goal: Ability to remain free from injury will improve Outcome: Progressing   Problem: Skin Integrity: Goal: Risk for impaired skin integrity will decrease Outcome: Progressing   Problem: Activity: Goal:  Ability to tolerate increased activity will improve Outcome: Progressing   Problem: Respiratory: Goal: Ability to maintain a clear airway and adequate ventilation will improve Outcome: Progressing   Problem: Role Relationship: Goal: Method of communication will improve Outcome: Progressing

## 2024-03-08 DIAGNOSIS — I959 Hypotension, unspecified: Secondary | ICD-10-CM | POA: Diagnosis not present

## 2024-03-08 NOTE — Plan of Care (Signed)
  Problem: Fluid Volume: Goal: Hemodynamic stability will improve Outcome: Progressing   Problem: Clinical Measurements: Goal: Diagnostic test results will improve Outcome: Progressing   Problem: Respiratory: Goal: Ability to maintain adequate ventilation will improve Outcome: Progressing   Problem: Respiratory: Goal: Ability to maintain a clear airway and adequate ventilation will improve Outcome: Progressing
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
## 2024-03-09 DIAGNOSIS — I959 Hypotension, unspecified: Secondary | ICD-10-CM | POA: Diagnosis not present
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
## 2024-03-10 DIAGNOSIS — I959 Hypotension, unspecified: Secondary | ICD-10-CM | POA: Diagnosis not present

## 2024-03-10 NOTE — TOC Progression Note (Signed)
 Transition of Care [facility]) - Progression Note    Patient Details  Name: Alex [name] MRN: [id number] Date of Birth: 1996-06-04  [facility] CM/SW Contact  Crayton Docker, RN 03/10/2024, 11:17 AM  Clinical Narrative:     Alert received Dr. Mosetta Areola regarding SNF clearance for patient return to [facility]. CM follow up call placed to [facility], Admissions, [facility], phone: [phone]. Per Maurie Southern, SNF is not cleared for patient to return to SNF and requests for CM to call [employer], Corporate Office, phone: [phone] and [facility], phone: [phone]. CM call to [facility] regarding patient return. Per Fabian Holster, will need to have patient admit to a sister facility and call CM back.   Expected Discharge Plan: Skilled Nursing Facility Barriers to Discharge: Continued Medical Work up  Expected Discharge Plan and Services    Return to prior facility   Living arrangements for the past 2 months: Skilled Nursing Facility                   Social Determinants of Health (SDOH) Interventions SDOH Screenings   Food Insecurity: Patient Unable To Answer (11/20/2023)  Housing: Patient Unable To Answer (11/20/2023)  Transportation Needs: Patient Unable To Answer (11/20/2023)  Utilities: Patient Unable To Answer (11/20/2023)  Financial Resource Strain: Low Risk  (12/02/2021)   Received from [facility]  Tobacco Use: Low Risk  (11/19/2023)    Readmission Risk Interventions     No data to display
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
## 2024-03-11 DIAGNOSIS — I959 Hypotension, unspecified: Secondary | ICD-10-CM | POA: Diagnosis not present
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
## 2024-03-11 NOTE — Progress Notes (Signed)
 Assumed care at 1500.  Pt awake and in no distress in room.

## 2024-03-11 NOTE — Plan of Care (Signed)
  Problem: Fluid Volume: Goal: Hemodynamic stability will improve Outcome: Progressing   Problem: Clinical Measurements: Goal: Signs and symptoms of infection will decrease Outcome: Progressing   Problem: Respiratory: Goal: Ability to maintain adequate ventilation will improve Outcome: Progressing   

## 2024-03-12 DIAGNOSIS — Z87820 Personal history of traumatic brain injury: Secondary | ICD-10-CM | POA: Diagnosis not present

## 2024-03-12 DIAGNOSIS — I9589 Other hypotension: Secondary | ICD-10-CM | POA: Diagnosis not present

## 2024-03-12 DIAGNOSIS — G40909 Epilepsy, unspecified, not intractable, without status epilepticus: Secondary | ICD-10-CM | POA: Diagnosis not present

## 2024-03-12 DIAGNOSIS — L89151 Pressure ulcer of sacral region, stage 1: Secondary | ICD-10-CM

## 2024-03-12 DIAGNOSIS — J9601 Acute respiratory failure with hypoxia: Secondary | ICD-10-CM | POA: Diagnosis not present

## 2024-03-12 NOTE — Plan of Care (Signed)
  Problem: Clinical Measurements: Goal: Ability to maintain clinical measurements within normal limits will improve Outcome: Progressing   Problem: Nutrition: Goal: Adequate nutrition will be maintained Outcome: Progressing   Problem: Safety: Goal: Ability to remain free from injury will improve Outcome: Progressing   

## 2024-03-12 NOTE — TOC Progression Note (Addendum)
 Transition of Care [facility]) - Progression Note    Patient Details  Name: Alex [name] MRN: [id number] Date of Birth: September 25, 1996  Transition of Care [facility]) CM/SW Contact  Crayton Docker, RN 03/12/2024, 12:49 PM  Clinical Narrative:     CM follow up call to [place], Admissions, [address] [place], [place], [place]. No answer, CM left message for return call. CM faxed updated clinicals for review.  CM call to [facility], [place], [place] phone: [phone]. CM spoke to [place]. Per Mariah Shines, requests CM fax clinicals. Per Mariah Shines, SNF accepts trach patients and Medicaid. Per Mariah Shines, fax number is [phone].Per Mariah Shines, facility has a male bed available. CM faxed SNF referral to [facility], Attn; Mariah Shines. CM will follow up. Expected Discharge Plan: Skilled Nursing Facility Barriers to Discharge: Continued Medical Work up  Expected Discharge Plan and Services    Long term care   Living arrangements for the past 2 months: Skilled Nursing Facility                  Social Determinants of Health (SDOH) Interventions SDOH Screenings   Food Insecurity: Patient Unable To Answer (11/20/2023)  Housing: Patient Unable To Answer (11/20/2023)  Transportation Needs: Patient Unable To Answer (11/20/2023)  Utilities: Patient Unable To Answer (11/20/2023)  Financial Resource Strain: Low Risk  (12/02/2021)   Received from [facility]  Tobacco Use: Low Risk  (11/19/2023)    Readmission Risk Interventions     No data to display
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
## 2024-03-12 NOTE — Plan of Care (Signed)
  Problem: Fluid Volume: Goal: Hemodynamic stability will improve Outcome: Progressing   Problem: Clinical Measurements: Goal: Signs and symptoms of infection will decrease Outcome: Progressing
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
## 2024-03-13 DIAGNOSIS — R000 Tachycardia, unspecified: Secondary | ICD-10-CM | POA: Diagnosis not present

## 2024-03-13 DIAGNOSIS — J9601 Acute respiratory failure with hypoxia: Secondary | ICD-10-CM | POA: Diagnosis not present

## 2024-03-13 DIAGNOSIS — I9589 Other hypotension: Secondary | ICD-10-CM | POA: Diagnosis not present

## 2024-03-13 DIAGNOSIS — Z87820 Personal history of traumatic brain injury: Secondary | ICD-10-CM | POA: Diagnosis not present

## 2024-03-13 NOTE — TOC Progression Note (Signed)
 Transition of Care [facility]) - Progression Note    Patient Details  Name: Alex [name] MRN: [id number] Date of Birth: Nov 23, 1995  Transition of Care [facility]) CM/SW Contact  Crayton Docker, RN 03/13/2024, 11:40 AM  Clinical Narrative:     CM follow up call placed to Alicia Inoue, Admissions, [facility] and [place], [place], [place], phone: [phone].No answer, CM left message for return call.   Expected Discharge Plan: Skilled Nursing Facility Barriers to Discharge: Continued Medical Work up  Expected Discharge Plan and Services    SNF LTC   Living arrangements for the past 2 months: Skilled Nursing Facility    Social Determinants of Health (SDOH) Interventions SDOH Screenings   Food Insecurity: Patient Unable To Answer (11/20/2023)  Housing: Patient Unable To Answer (11/20/2023)  Transportation Needs: Patient Unable To Answer (11/20/2023)  Utilities: Patient Unable To Answer (11/20/2023)  Financial Resource Strain: Low Risk  (12/02/2021)   Received from [facility]  Tobacco Use: Low Risk  (11/19/2023)    Readmission Risk Interventions     No data to display
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
## 2024-03-14 DIAGNOSIS — Z87820 Personal history of traumatic brain injury: Secondary | ICD-10-CM | POA: Diagnosis not present

## 2024-03-14 DIAGNOSIS — I9589 Other hypotension: Secondary | ICD-10-CM | POA: Diagnosis not present

## 2024-03-14 DIAGNOSIS — J9601 Acute respiratory failure with hypoxia: Secondary | ICD-10-CM | POA: Diagnosis not present

## 2024-03-14 DIAGNOSIS — L89321 Pressure ulcer of left buttock, stage 1: Secondary | ICD-10-CM

## 2024-03-14 DIAGNOSIS — R000 Tachycardia, unspecified: Secondary | ICD-10-CM | POA: Diagnosis not present

## 2024-03-14 NOTE — TOC Transition Note (Signed)
 Transition of Care [facility]) - Discharge Note   Patient Details  Name: Alex [name] MRN: [id number] Date of Birth: November 23, 1995  Transition of Care [facility]) CM/SW Contact:  Alexandra Ice, RN Phone Number: 03/14/2024, 1:12 PM   Clinical Narrative:     [facility], spoke to Admission Coordinator, inquiring about update. She stated they were in middle state survey yesterday and the nurse received referral will review. Provided TOC contact information. Will await call back.     Barriers to Discharge: Continued Medical Work up   Patient Goals and CMS Choice            Discharge Placement                       Discharge Plan and Services Additional resources added to the After Visit Summary for                                       Social Drivers of Health (SDOH) Interventions SDOH Screenings   Food Insecurity: Patient Unable To Answer (11/20/2023)  Housing: Patient Unable To Answer (11/20/2023)  Transportation Needs: Patient Unable To Answer (11/20/2023)  Utilities: Patient Unable To Answer (11/20/2023)  Financial Resource Strain: Low Risk  (12/02/2021)   Received from [facility]  Tobacco Use: Low Risk  (11/19/2023)     Readmission Risk Interventions     No data to display
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
## 2024-03-15 DIAGNOSIS — I9589 Other hypotension: Secondary | ICD-10-CM | POA: Diagnosis not present

## 2024-03-15 DIAGNOSIS — A419 Sepsis, unspecified organism: Secondary | ICD-10-CM | POA: Diagnosis not present

## 2024-03-15 DIAGNOSIS — Z87820 Personal history of traumatic brain injury: Secondary | ICD-10-CM | POA: Diagnosis not present

## 2024-03-15 DIAGNOSIS — J9601 Acute respiratory failure with hypoxia: Secondary | ICD-10-CM | POA: Diagnosis not present

## 2024-03-15 LAB — CBC
HCT: 43.3 % (ref 39.0–52.0)
Hemoglobin: 13.7 g/dL (ref 13.0–17.0)
MCH: 28.2 pg (ref 26.0–34.0)
MCHC: 31.6 g/dL (ref 30.0–36.0)
MCV: 89.1 fL (ref 80.0–100.0)
Platelets: 228 10*3/uL (ref 150–400)
RBC: 4.86 MIL/uL (ref 4.22–5.81)
RDW: 12.6 % (ref 11.5–15.5)
WBC: 5 10*3/uL (ref 4.0–10.5)
nRBC: 0 % (ref 0.0–0.2)

## 2024-03-15 LAB — BASIC METABOLIC PANEL WITH GFR
Anion gap: 9 (ref 5–15)
BUN: 19 mg/dL (ref 6–20)
CO2: 27 mmol/L (ref 22–32)
Calcium: 9 mg/dL (ref 8.9–10.3)
Chloride: 106 mmol/L (ref 98–111)
Creatinine, Ser: 0.53 mg/dL — ABNORMAL LOW (ref 0.61–1.24)
GFR, Estimated: >60 mL/min (ref >60)
Glucose, Bld: 100 mg/dL — ABNORMAL HIGH (ref 70–99)
Potassium: 4.6 mmol/L (ref 3.5–5.1)
Sodium: 142 mmol/L (ref 135–145)

## 2024-03-15 MED ORDER — FAMOTIDINE 40 MG/5ML PO SUSR
20.0000 mg | Freq: Two times a day (BID) | ORAL | Status: DC
  Administered 2024-03-15 – 2024-05-23 (×139): 20 mg
  Filled 2024-03-15 (×142): qty 2.5
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
## 2024-03-16 DIAGNOSIS — G40909 Epilepsy, unspecified, not intractable, without status epilepticus: Secondary | ICD-10-CM | POA: Diagnosis not present

## 2024-03-16 DIAGNOSIS — I9589 Other hypotension: Secondary | ICD-10-CM | POA: Diagnosis not present

## 2024-03-16 DIAGNOSIS — J9601 Acute respiratory failure with hypoxia: Secondary | ICD-10-CM | POA: Diagnosis not present

## 2024-03-16 DIAGNOSIS — Z87820 Personal history of traumatic brain injury: Secondary | ICD-10-CM | POA: Diagnosis not present

## 2024-03-16 NOTE — Plan of Care (Signed)
  Problem: Fluid Volume: Goal: Hemodynamic stability will improve Outcome: Progressing   Problem: Clinical Measurements: Goal: Signs and symptoms of infection will decrease Outcome: Progressing   Problem: Respiratory: Goal: Ability to maintain adequate ventilation will improve Outcome: Progressing   Problem: Activity: Goal: Ability to tolerate increased activity will improve Outcome: Progressing   Problem: Respiratory: Goal: Ability to maintain a clear airway and adequate ventilation will improve Outcome: Progressing   Problem: Role Relationship: Goal: Method of communication will improve Outcome: Progressing   Problem: Clinical Measurements: Goal: Ability to maintain clinical measurements within normal limits will improve Outcome: Progressing   Problem: Elimination: Goal: Will not experience complications related to bowel motility Outcome: Progressing   Problem: Pain Managment: Goal: General experience of comfort will improve and/or be controlled Outcome: Progressing   Problem: Activity: Goal: Ability to tolerate increased activity will improve Outcome: Progressing   Problem: Respiratory: Goal: Ability to maintain a clear airway and adequate ventilation will improve Outcome: Progressing
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
## 2024-03-16 NOTE — Progress Notes (Signed)
 Asked patient if nursing staff could give him a spa day and "spiffy him up." Patient agreeable. Bath given with back rub, lotion applied, deodorant applied. Wound care performed per orders. Hair shampooed and brushed, ears cleaned. Face washed. Patient repositioned in bed. Patient exhibiting signs of discomfort during wound care, but otherwise pleasant and smiling.     03/16/24 1724  Hygiene  Oral Care Moisturizer applied to oral mucosa and lips;Moisturizer applied to lips;Other (Comment) (Vasaline gauze)  Hygiene Peri care;Bath;Back rub;Hair washed  Level of Assistance Maximum assist  Skin Care Foam skin cleanser  Type of powder Antifungal  Linen Change Gown changed;Bed pad changed;Top sheet changed;Blanket(s) changed

## 2024-03-17 DIAGNOSIS — Z87820 Personal history of traumatic brain injury: Secondary | ICD-10-CM | POA: Diagnosis not present

## 2024-03-17 DIAGNOSIS — J9601 Acute respiratory failure with hypoxia: Secondary | ICD-10-CM | POA: Diagnosis not present

## 2024-03-17 DIAGNOSIS — G40909 Epilepsy, unspecified, not intractable, without status epilepticus: Secondary | ICD-10-CM | POA: Diagnosis not present

## 2024-03-17 DIAGNOSIS — I9589 Other hypotension: Secondary | ICD-10-CM | POA: Diagnosis not present
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
## 2024-03-17 NOTE — Plan of Care (Signed)
  Problem: Fluid Volume: Goal: Hemodynamic stability will improve Outcome: Progressing   Problem: Clinical Measurements: Goal: Signs and symptoms of infection will decrease Outcome: Progressing   Problem: Respiratory: Goal: Ability to maintain adequate ventilation will improve Outcome: Progressing   

## 2024-03-17 NOTE — TOC Progression Note (Signed)
 Transition of Care [facility]) - Progression Note    Patient Details  Name: Alex [name] MRN: [id number] Date of Birth: 1996-09-28  Transition of Care [facility]) CM/SW Contact  Crayton Docker, RN 03/17/2024, 3:39 PM  Clinical Narrative:     CM follow up call placed to Alicia Inoue, Admissions, [place] and [place], phone: [phone] regarding SNF referral for long term care placement. . No answer, CM left message for return call. No auth needed. BLS transport required.   Expected Discharge Plan: Skilled Nursing Facility Barriers to Discharge: Continued Medical Work up  Expected Discharge Plan and Services       Living arrangements for the past 2 months: Skilled Nursing Facility                                       Social Determinants of Health (SDOH) Interventions SDOH Screenings   Food Insecurity: Patient Unable To Answer (11/20/2023)  Housing: Patient Unable To Answer (11/20/2023)  Transportation Needs: Patient Unable To Answer (11/20/2023)  Utilities: Patient Unable To Answer (11/20/2023)  Financial Resource Strain: Low Risk  (12/02/2021)   Received from [facility]  Tobacco Use: Low Risk  (11/19/2023)    Readmission Risk Interventions     No data to display

## 2024-03-17 NOTE — Progress Notes (Signed)
 Upon scheduled assessment RT observed pt sound asleep and in no distress. Found trach to be completely out of stoma. Isabella Mao ties were still attached but loosened. RT received assistance from RN to replaced trach back into stoma using obturator with ease. New trach ties were applied and C02 detector was used to confirm a yellow color change. Pt stable, trach secure and in no distress during this event. RT will cont to monitor.

## 2024-03-18 DIAGNOSIS — Z87820 Personal history of traumatic brain injury: Secondary | ICD-10-CM | POA: Diagnosis not present

## 2024-03-18 DIAGNOSIS — I9589 Other hypotension: Secondary | ICD-10-CM | POA: Diagnosis not present

## 2024-03-18 DIAGNOSIS — G40909 Epilepsy, unspecified, not intractable, without status epilepticus: Secondary | ICD-10-CM | POA: Diagnosis not present

## 2024-03-18 DIAGNOSIS — J9601 Acute respiratory failure with hypoxia: Secondary | ICD-10-CM | POA: Diagnosis not present
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
## 2024-03-18 NOTE — Plan of Care (Signed)
  Problem: Clinical Measurements: Goal: Signs and symptoms of infection will decrease Outcome: Not Progressing   Problem: Respiratory: Goal: Ability to maintain adequate ventilation will improve Outcome: Not Progressing

## 2024-03-19 DIAGNOSIS — Z87820 Personal history of traumatic brain injury: Secondary | ICD-10-CM | POA: Diagnosis not present

## 2024-03-19 NOTE — Plan of Care (Signed)
  Problem: Clinical Measurements: Goal: Signs and symptoms of infection will decrease Outcome: Progressing   Problem: Respiratory: Goal: Ability to maintain adequate ventilation will improve Outcome: Progressing   Problem: Clinical Measurements: Goal: Respiratory complications will improve Outcome: Progressing   Problem: Respiratory: Goal: Ability to maintain a clear airway and adequate ventilation will improve Outcome: Progressing

## 2024-03-19 NOTE — TOC Progression Note (Signed)
 Transition of Care [facility]) - Progression Note    Patient Details  Name: Alex [name] MRN: [id number] Date of Birth: 08-23-1996  Transition of Care [facility]) CM/SW Contact  Alexandra Ice, RN Phone Number: 03/19/2024, 9:28 AM  Clinical Narrative:    Attempted to contact Alicia Inoue at [facility], inquiring about referral and if they are able to accept patient. No answer and left message requesting callback. TOC provided contact number.  Sent message to [place] with [employer], asking if they can review to see if they are able to accept, awaiting response.   Expected Discharge Plan: Skilled Nursing Facility Barriers to Discharge: Continued Medical Work up  Expected Discharge Plan and Services       Living arrangements for the past 2 months: Skilled Nursing Facility                                       Social Determinants of Health (SDOH) Interventions SDOH Screenings   Food Insecurity: Patient Unable To Answer (11/20/2023)  Housing: Patient Unable To Answer (11/20/2023)  Transportation Needs: Patient Unable To Answer (11/20/2023)  Utilities: Patient Unable To Answer (11/20/2023)  Financial Resource Strain: Low Risk  (12/02/2021)   Received from [facility]  Tobacco Use: Low Risk  (11/19/2023)    Readmission Risk Interventions     No data to display
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
## 2024-03-19 NOTE — Progress Notes (Signed)
 Trach change today 6.0shiley flex cuffless without incident.
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
## 2024-03-20 DIAGNOSIS — Z87820 Personal history of traumatic brain injury: Secondary | ICD-10-CM | POA: Diagnosis not present

## 2024-03-20 NOTE — Progress Notes (Signed)
   03/20/24 0945  Spiritual Encounters  Type of Visit Initial  Care provided to: Patient  Conversation partners present during encounter Other (comment) Training and development officer)  Reason for visit Routine spiritual support  OnCall Visit Yes   Staff shared patient may benefit from a spiritual care visit.  Chaplain visited and offered the patient a blessing when Chaplain observed it may be difficult for the patient to talk.    Rev. Rana M. Nolon Baxter, M.Div. Chaplain Resident [facility]

## 2024-03-20 NOTE — Plan of Care (Signed)
  Problem: Fluid Volume: Goal: Hemodynamic stability will improve Outcome: Progressing   Problem: Respiratory: Goal: Ability to maintain adequate ventilation will improve Outcome: Progressing   Problem: Activity: Goal: Ability to tolerate increased activity will improve Outcome: Progressing

## 2024-03-20 NOTE — TOC Progression Note (Addendum)
 Transition of Care [facility]) - Progression Note    Patient Details  Name: Alex [name] MRN: [id number] Date of Birth: Aug 01, 1996  [facility]) CM/SW Contact  Crayton Docker, RN 03/20/2024, 10:09 AM  Clinical Narrative:     CM follow up call placed to Mariah Shines, Admissions,  [facility] and [place], [place], [place]. Per Mariah Shines, SNF referral is with clinicals RN, will follow up can call CM back. CM provided Mariah Shines, Admissions, [facility] with contact number for call back.   Secure message received from Mariah Shines, Admissions, [facility] and [place]. SNF referral not received back from nurse performing clinical review. CM to follow up on tomorrow.   CM call to Admissions, [facility] and [facility], [place], phone: [phone]. [place], Admissions not in office. CM call placed to [place], phone: [phone] regarding SNF referral for placement. Per Gladis Lame, will look for referral for review and call CM back. CM will fax referral to [phone].  Expected Discharge Plan: Skilled Nursing Facility Barriers to Discharge: Continued Medical Work up  Expected Discharge Plan and Services       Living arrangements for the past 2 months: Skilled Nursing Facility                                       Social Determinants of Health (SDOH) Interventions SDOH Screenings   Food Insecurity: Patient Unable To Answer (11/20/2023)  Housing: Patient Unable To Answer (11/20/2023)  Transportation Needs: Patient Unable To Answer (11/20/2023)  Utilities: Patient Unable To Answer (11/20/2023)  Financial Resource Strain: Low Risk  (12/02/2021)   Received from [facility]  Tobacco Use: Low Risk  (11/19/2023)    Readmission Risk Interventions     No data to display
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
## 2024-03-21 DIAGNOSIS — Z87820 Personal history of traumatic brain injury: Secondary | ICD-10-CM | POA: Diagnosis not present

## 2024-03-21 NOTE — TOC Progression Note (Addendum)
 Transition of Care [facility]) - Progression Note    Patient Details  Name: Alex [name] MRN: [id number] Date of Birth: 10-Mar-1996  [facility] CM/SW Contact  Crayton Docker, RN 03/21/2024, 3:20 PM  Clinical Narrative:     Call received from [facility] regarding SNF referral for long term care placement. Per [place], SNF has declined SNF referral.  CM follow up call to [place], Admissions, [facility], [place] regarding SNF referral. No answer, CM left message for return call.   CM follow up call placed to Admissions, [facility], [place]. No answer, CM left message for Angie to call CM back.  CM call to patient's mother, Jullie Oiler, phone: [phone] regarding discharge care planning. No answer, CM left message for return call.    Secure message received from Melisa Spray, Admissions, [facility] [place], [place] referral with DON for review. CM will follow up . Expected Discharge Plan: Skilled Nursing Facility Barriers to Discharge: Continued Medical Work up  Expected Discharge Plan and Services    SNF   Living arrangements for the past 2 months: Skilled Nursing Facility    Social Determinants of Health (SDOH) Interventions SDOH Screenings   Food Insecurity: Patient Unable To Answer (11/20/2023)  Housing: Patient Unable To Answer (11/20/2023)  Transportation Needs: Patient Unable To Answer (11/20/2023)  Utilities: Patient Unable To Answer (11/20/2023)  Financial Resource Strain: Low Risk  (12/02/2021)   Received from [facility]  Tobacco Use: Low Risk  (11/19/2023)    Readmission Risk Interventions     No data to display
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
## 2024-03-21 NOTE — Plan of Care (Signed)
   Problem: Fluid Volume: Goal: Hemodynamic stability will improve Outcome: Progressing

## 2024-03-22 DIAGNOSIS — Z87820 Personal history of traumatic brain injury: Secondary | ICD-10-CM | POA: Diagnosis not present

## 2024-03-22 NOTE — Plan of Care (Signed)
  Problem: Clinical Measurements: Goal: Ability to maintain clinical measurements within normal limits will improve Outcome: Progressing   Problem: Nutrition: Goal: Adequate nutrition will be maintained Outcome: Progressing   Problem: Safety: Goal: Ability to remain free from injury will improve Outcome: Progressing   

## 2024-03-22 NOTE — Plan of Care (Signed)
?  Problem: Respiratory: ?Goal: Ability to maintain adequate ventilation will improve ?Outcome: Progressing ?  ?Problem: Elimination: ?Goal: Will not experience complications related to bowel motility ?Outcome: Progressing ?  ?
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
## 2024-03-23 DIAGNOSIS — Z87820 Personal history of traumatic brain injury: Secondary | ICD-10-CM | POA: Diagnosis not present
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
## 2024-03-23 NOTE — Plan of Care (Signed)
   Problem: Clinical Measurements: Goal: Ability to maintain clinical measurements within normal limits will improve Outcome: Progressing   Problem: Activity: Goal: Risk for activity intolerance will decrease Outcome: Progressing   Problem: Nutrition: Goal: Adequate nutrition will be maintained Outcome: Progressing

## 2024-03-24 DIAGNOSIS — Z87820 Personal history of traumatic brain injury: Secondary | ICD-10-CM | POA: Diagnosis not present

## 2024-03-24 NOTE — Plan of Care (Signed)
  Problem: Fluid Volume: Goal: Hemodynamic stability will improve Outcome: Progressing   Problem: Clinical Measurements: Goal: Signs and symptoms of infection will decrease Outcome: Progressing   Problem: Respiratory: Goal: Ability to maintain adequate ventilation will improve Outcome: Progressing   Problem: Activity: Goal: Ability to tolerate increased activity will improve Outcome: Progressing   Problem: Respiratory: Goal: Ability to maintain a clear airway and adequate ventilation will improve Outcome: Progressing   Problem: Role Relationship: Goal: Method of communication will improve Outcome: Progressing   Problem: Clinical Measurements: Goal: Ability to maintain clinical measurements within normal limits will improve Outcome: Progressing Goal: Will remain free from infection Outcome: Progressing Goal: Diagnostic test results will improve Outcome: Progressing Goal: Respiratory complications will improve Outcome: Progressing Goal: Cardiovascular complication will be avoided Outcome: Progressing   Problem: Activity: Goal: Risk for activity intolerance will decrease Outcome: Progressing   Problem: Nutrition: Goal: Adequate nutrition will be maintained Outcome: Progressing   Problem: Coping: Goal: Level of anxiety will decrease Outcome: Progressing   Problem: Elimination: Goal: Will not experience complications related to bowel motility Outcome: Progressing Goal: Will not experience complications related to urinary retention Outcome: Progressing   Problem: Pain Managment: Goal: General experience of comfort will improve and/or be controlled Outcome: Progressing   Problem: Safety: Goal: Ability to remain free from injury will improve Outcome: Progressing   Problem: Skin Integrity: Goal: Risk for impaired skin integrity will decrease Outcome: Progressing   Problem: Activity: Goal: Ability to tolerate increased activity will improve Outcome:  Progressing   Problem: Respiratory: Goal: Ability to maintain a clear airway and adequate ventilation will improve Outcome: Progressing   Problem: Role Relationship: Goal: Method of communication will improve Outcome: Progressing
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
## 2024-03-25 DIAGNOSIS — Z87820 Personal history of traumatic brain injury: Secondary | ICD-10-CM | POA: Diagnosis not present

## 2024-03-25 LAB — BASIC METABOLIC PANEL WITH GFR
Anion gap: 8 (ref 5–15)
BUN: 19 mg/dL (ref 6–20)
CO2: 26 mmol/L (ref 22–32)
Calcium: 8.9 mg/dL (ref 8.9–10.3)
Chloride: 105 mmol/L (ref 98–111)
Creatinine, Ser: 0.49 mg/dL — ABNORMAL LOW (ref 0.61–1.24)
GFR, Estimated: >60 mL/min (ref >60)
Glucose, Bld: 99 mg/dL (ref 70–99)
Potassium: 4.2 mmol/L (ref 3.5–5.1)
Sodium: 139 mmol/L (ref 135–145)

## 2024-03-25 LAB — CBC
HCT: 41.3 % (ref 39.0–52.0)
Hemoglobin: 13.1 g/dL (ref 13.0–17.0)
MCH: 28.1 pg (ref 26.0–34.0)
MCHC: 31.7 g/dL (ref 30.0–36.0)
MCV: 88.6 fL (ref 80.0–100.0)
Platelets: 212 10*3/uL (ref 150–400)
RBC: 4.66 MIL/uL (ref 4.22–5.81)
RDW: 13.1 % (ref 11.5–15.5)
WBC: 5.8 10*3/uL (ref 4.0–10.5)
nRBC: 0 % (ref 0.0–0.2)

## 2024-03-25 NOTE — Plan of Care (Signed)
  Problem: Fluid Volume: Goal: Hemodynamic stability will improve Outcome: Progressing   Problem: Clinical Measurements: Goal: Signs and symptoms of infection will decrease Outcome: Progressing   Problem: Respiratory: Goal: Ability to maintain adequate ventilation will improve Outcome: Progressing   Problem: Activity: Goal: Ability to tolerate increased activity will improve Outcome: Progressing   Problem: Respiratory: Goal: Ability to maintain a clear airway and adequate ventilation will improve Outcome: Progressing   Problem: Role Relationship: Goal: Method of communication will improve Outcome: Progressing   Problem: Clinical Measurements: Goal: Ability to maintain clinical measurements within normal limits will improve Outcome: Progressing Goal: Will remain free from infection Outcome: Progressing Goal: Diagnostic test results will improve Outcome: Progressing Goal: Respiratory complications will improve Outcome: Progressing Goal: Cardiovascular complication will be avoided Outcome: Progressing   Problem: Activity: Goal: Risk for activity intolerance will decrease Outcome: Progressing   Problem: Nutrition: Goal: Adequate nutrition will be maintained Outcome: Progressing   Problem: Coping: Goal: Level of anxiety will decrease Outcome: Progressing   Problem: Elimination: Goal: Will not experience complications related to bowel motility Outcome: Progressing Goal: Will not experience complications related to urinary retention Outcome: Progressing   Problem: Pain Managment: Goal: General experience of comfort will improve and/or be controlled Outcome: Progressing   Problem: Safety: Goal: Ability to remain free from injury will improve Outcome: Progressing   Problem: Skin Integrity: Goal: Risk for impaired skin integrity will decrease Outcome: Progressing   Problem: Activity: Goal: Ability to tolerate increased activity will improve Outcome:  Progressing   Problem: Respiratory: Goal: Ability to maintain a clear airway and adequate ventilation will improve Outcome: Progressing   Problem: Role Relationship: Goal: Method of communication will improve Outcome: Progressing

## 2024-03-25 NOTE — TOC Progression Note (Signed)
 Transition of Care [facility]) - Progression Note    Patient Details  Name: Alex [name] MRN: [id number] Date of Birth: July 08, 1996  Transition of Care [facility]) CM/SW Contact  Crayton Docker, RN 03/25/2024, 10:58 AM  Clinical Narrative:      CM follow up call placed to Lonell Rives, Admissions, [facility], phone: [phone] regarding SNF LTC referral review. No answer, CM left message for return call. CM call placed to [place], Admissions, [facility], [place], phone: [phone]. No answer, CM left message for return call.   Expected Discharge Plan: Skilled Nursing Facility Barriers to Discharge: Continued Medical Work up  Expected Discharge Plan and Services    SNF LTC   Living arrangements for the past 2 months: Skilled Nursing Facility      Social Determinants of Health (SDOH) Interventions SDOH Screenings   Food Insecurity: Patient Unable To Answer (11/20/2023)  Housing: Patient Unable To Answer (11/20/2023)  Transportation Needs: Patient Unable To Answer (11/20/2023)  Utilities: Patient Unable To Answer (11/20/2023)  Financial Resource Strain: Low Risk  (12/02/2021)   Received from [facility]  Tobacco Use: Low Risk  (11/19/2023)    Readmission Risk Interventions     No data to display
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
## 2024-03-26 DIAGNOSIS — G40909 Epilepsy, unspecified, not intractable, without status epilepticus: Secondary | ICD-10-CM | POA: Diagnosis not present

## 2024-03-26 DIAGNOSIS — I9589 Other hypotension: Secondary | ICD-10-CM | POA: Diagnosis not present

## 2024-03-26 DIAGNOSIS — Z87820 Personal history of traumatic brain injury: Secondary | ICD-10-CM | POA: Diagnosis not present

## 2024-03-26 DIAGNOSIS — J9601 Acute respiratory failure with hypoxia: Secondary | ICD-10-CM | POA: Diagnosis not present
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
## 2024-03-27 DIAGNOSIS — J9601 Acute respiratory failure with hypoxia: Secondary | ICD-10-CM | POA: Diagnosis not present

## 2024-03-27 DIAGNOSIS — Z87820 Personal history of traumatic brain injury: Secondary | ICD-10-CM | POA: Diagnosis not present

## 2024-03-27 DIAGNOSIS — I9589 Other hypotension: Secondary | ICD-10-CM | POA: Diagnosis not present

## 2024-03-27 DIAGNOSIS — G40909 Epilepsy, unspecified, not intractable, without status epilepticus: Secondary | ICD-10-CM | POA: Diagnosis not present

## 2024-03-27 MED ORDER — DILTIAZEM HCL 30 MG PO TABS
30.0000 mg | ORAL_TABLET | Freq: Three times a day (TID) | ORAL | Status: DC
  Administered 2024-03-27 – 2024-03-29 (×6): 30 mg
  Filled 2024-03-27 (×8): qty 1

## 2024-03-27 NOTE — TOC Progression Note (Addendum)
 Transition of Care [facility]) - Progression Note    Patient Details  Name: Alex [name] MRN: [id number] Date of Birth: 1996/08/14  Transition of [facility]) CM/SW Contact  Crayton Docker, RN 03/27/2024, 3:34 PM  Clinical Narrative:     CM follow up call placed to Antone Kiss, Admissions, [facility], phone: [phone], no answer, CM left message for return call.   Email received from Zachary B, TOC Director regarding SNF acceptance to [place], [place] SNF. CM will follow regarding confirmation and contact patient's mother regarding accepting SNF offer.   Expected Discharge Plan: Skilled Nursing Facility Barriers to Discharge: Continued Medical Work up  Expected Discharge Plan and Services       Living arrangements for the past 2 months: Skilled Nursing Facility                                       Social Determinants of Health (SDOH) Interventions SDOH Screenings   Food Insecurity: Patient Unable To Answer (11/20/2023)  Housing: Patient Unable To Answer (11/20/2023)  Transportation Needs: Patient Unable To Answer (11/20/2023)  Utilities: Patient Unable To Answer (11/20/2023)  Financial Resource Strain: Low Risk  (12/02/2021)   Received from [facility]  Tobacco Use: Low Risk  (11/19/2023)    Readmission Risk Interventions     No data to display

## 2024-03-27 NOTE — Plan of Care (Signed)
  Problem: Fluid Volume: Goal: Hemodynamic stability will improve Outcome: Progressing   Problem: Respiratory: Goal: Ability to maintain adequate ventilation will improve Outcome: Progressing   Problem: Clinical Measurements: Goal: Diagnostic test results will improve Outcome: Progressing   Problem: Nutrition: Goal: Adequate nutrition will be maintained Outcome: Progressing   Problem: Respiratory: Goal: Ability to maintain a clear airway and adequate ventilation will improve Outcome: Progressing
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
## 2024-03-28 DIAGNOSIS — J9601 Acute respiratory failure with hypoxia: Secondary | ICD-10-CM | POA: Diagnosis not present

## 2024-03-28 DIAGNOSIS — I9589 Other hypotension: Secondary | ICD-10-CM | POA: Diagnosis not present

## 2024-03-28 DIAGNOSIS — Z87820 Personal history of traumatic brain injury: Secondary | ICD-10-CM | POA: Diagnosis not present

## 2024-03-28 DIAGNOSIS — R000 Tachycardia, unspecified: Secondary | ICD-10-CM | POA: Diagnosis not present

## 2024-03-28 MED ORDER — METOPROLOL TARTRATE 25 MG PO TABS
12.5000 mg | ORAL_TABLET | Freq: Two times a day (BID) | ORAL | Status: DC
  Administered 2024-03-28 – 2024-05-23 (×105): 12.5 mg
  Filled 2024-03-28 (×110): qty 1
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
## 2024-03-28 NOTE — TOC Progression Note (Addendum)
 Transition of Care [facility]) - Progression Note    Patient Details  Name: Alex [name] MRN: [id number] Date of Birth: [birth year]  Transition of Care [facility]) CM/SW Contact  Crayton Docker, RN 03/28/2024, 3:38 PM  Clinical Narrative:     CM discussed case with Dr. Clelia Current regarding discharge care planning and possible facility acceptance in [place], [place] per System Wide Director, Raechel Bulla B. Per Helaine Llanos, will need discharge summary as soon as possible for review; discussed with Dr. Clelia Current. CM follow up call placed to Admissions, [facility], phone: [phone] regarding SNF referral. CM spoke to [place]. Per Valinda Gault, request SNF referral to be refaxed to [phone]. Per Valinda Gault, call back phone number is [phone].CM will follow up.   CM call to patient's mother, Jullie Oiler, phone: [phone], no answer, CM left message for return call. CM call to patient's grandmother, Alleen Isle, phone: [phone] regarding call attempts to patient's mother, Jullie Oiler and to discuss placement. Per patient's grandmother, will write down CM's phone number and provide number to patient's mother; phone disconnects.   Call received from patient's mother, Jullie Oiler regarding placement. Per patient's mother, requests CM to call [facility] and ask for Carilyn Charles, Admissions regarding SNF placement. Per patient's mother, "called the [place] on [place] about how Tre was treated." CM and patient's mother, discussed [place], [place] facility. Per patient's mother a little far, although did not decline possible placement offer. CM and patient's mother also discussed [facility]. Per patient's mother, will to continue also research SNFs for placement. CM call to [facility], phone: [phone] regarding SNF referral and Josefina Nian, Admissions. Josefina Nian is out of the office and will return on Monday, 03/31/2024. CM faxed SNF referral to facility fax number, [phone] to  attention of Bernell Brigham, Administrator and Josefina Nian, Admissions for review for placement.   Expected Discharge Plan: Skilled Nursing Facility Barriers to Discharge: Continued Medical Work up  Expected Discharge Plan and Services   SNF LTC     Living arrangements for the past 2 months: Skilled Nursing Facility      Social Determinants of Health (SDOH) Interventions SDOH Screenings   Food Insecurity: Patient Unable To Answer (11/20/2023)  Housing: Patient Unable To Answer (11/20/2023)  Transportation Needs: Patient Unable To Answer (11/20/2023)  Utilities: Patient Unable To Answer (11/20/2023)  Financial Resource Strain: Low Risk  (12/02/2021)   Received from [facility]  Tobacco Use: Low Risk  (11/19/2023)    Readmission Risk Interventions     No data to display

## 2024-03-28 NOTE — Plan of Care (Signed)
  Problem: Fluid Volume: Goal: Hemodynamic stability will improve Outcome: Progressing   Problem: Respiratory: Goal: Ability to maintain adequate ventilation will improve Outcome: Progressing   Problem: Respiratory: Goal: Ability to maintain a clear airway and adequate ventilation will improve Outcome: Progressing   Problem: Clinical Measurements: Goal: Diagnostic test results will improve Outcome: Progressing   Problem: Nutrition: Goal: Adequate nutrition will be maintained Outcome: Progressing

## 2024-03-29 DIAGNOSIS — J9601 Acute respiratory failure with hypoxia: Secondary | ICD-10-CM | POA: Diagnosis not present

## 2024-03-29 DIAGNOSIS — I9589 Other hypotension: Secondary | ICD-10-CM | POA: Diagnosis not present

## 2024-03-29 DIAGNOSIS — Z87820 Personal history of traumatic brain injury: Secondary | ICD-10-CM | POA: Diagnosis not present

## 2024-03-29 DIAGNOSIS — G40909 Epilepsy, unspecified, not intractable, without status epilepticus: Secondary | ICD-10-CM | POA: Diagnosis not present
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
## 2024-03-29 NOTE — Plan of Care (Signed)
  Problem: Fluid Volume: Goal: Hemodynamic stability will improve Outcome: Progressing   Problem: Respiratory: Goal: Ability to maintain adequate ventilation will improve Outcome: Progressing   Problem: Respiratory: Goal: Ability to maintain a clear airway and adequate ventilation will improve Outcome: Progressing   Problem: Clinical Measurements: Goal: Diagnostic test results will improve Outcome: Progressing Goal: Respiratory complications will improve Outcome: Progressing   Problem: Nutrition: Goal: Adequate nutrition will be maintained Outcome: Progressing

## 2024-03-30 DIAGNOSIS — G40909 Epilepsy, unspecified, not intractable, without status epilepticus: Secondary | ICD-10-CM | POA: Diagnosis not present

## 2024-03-30 DIAGNOSIS — I9589 Other hypotension: Secondary | ICD-10-CM | POA: Diagnosis not present

## 2024-03-30 DIAGNOSIS — J9601 Acute respiratory failure with hypoxia: Secondary | ICD-10-CM | POA: Diagnosis not present

## 2024-03-30 DIAGNOSIS — Z87820 Personal history of traumatic brain injury: Secondary | ICD-10-CM | POA: Diagnosis not present
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
## 2024-03-31 DIAGNOSIS — J9601 Acute respiratory failure with hypoxia: Secondary | ICD-10-CM | POA: Diagnosis not present

## 2024-03-31 DIAGNOSIS — I9589 Other hypotension: Secondary | ICD-10-CM | POA: Diagnosis not present

## 2024-03-31 DIAGNOSIS — R000 Tachycardia, unspecified: Secondary | ICD-10-CM | POA: Diagnosis not present

## 2024-03-31 DIAGNOSIS — Z87820 Personal history of traumatic brain injury: Secondary | ICD-10-CM | POA: Diagnosis not present

## 2024-03-31 NOTE — Plan of Care (Signed)
 Pt very irritable and agitated overnight. Therapeutic talking done with some response that was consistent.    Problem: Fluid Volume: Goal: Hemodynamic stability will improve Outcome: Progressing   Problem: Clinical Measurements: Goal: Signs and symptoms of infection will decrease Outcome: Progressing   Problem: Respiratory: Goal: Ability to maintain adequate ventilation will improve Outcome: Progressing   Problem: Activity: Goal: Ability to tolerate increased activity will improve Outcome: Progressing   Problem: Elimination: Goal: Will not experience complications related to bowel motility Outcome: Progressing Goal: Will not experience complications related to urinary retention Outcome: Progressing

## 2024-03-31 NOTE — Plan of Care (Signed)
  Problem: Fluid Volume: Goal: Hemodynamic stability will improve 03/31/2024 0748 by Auther Legacy, RN Outcome: Progressing 03/31/2024 0747 by Auther Legacy, RN Outcome: Progressing   Problem: Clinical Measurements: Goal: Signs and symptoms of infection will decrease Outcome: Progressing   Problem: Respiratory: Goal: Ability to maintain adequate ventilation will improve 03/31/2024 0748 by Auther Legacy, RN Outcome: Progressing 03/31/2024 0747 by Auther Legacy, RN Outcome: Progressing   Problem: Activity: Goal: Ability to tolerate increased activity will improve 03/31/2024 0748 by Auther Legacy, RN Outcome: Progressing 03/31/2024 0747 by Auther Legacy, RN Outcome: Progressing   Problem: Elimination: Goal: Will not experience complications related to bowel motility Outcome: Progressing Goal: Will not experience complications related to urinary retention Outcome: Progressing
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
## 2024-04-01 DIAGNOSIS — I9589 Other hypotension: Secondary | ICD-10-CM | POA: Diagnosis not present

## 2024-04-01 DIAGNOSIS — J9601 Acute respiratory failure with hypoxia: Secondary | ICD-10-CM | POA: Diagnosis not present

## 2024-04-01 DIAGNOSIS — Z87820 Personal history of traumatic brain injury: Secondary | ICD-10-CM | POA: Diagnosis not present

## 2024-04-01 DIAGNOSIS — G40909 Epilepsy, unspecified, not intractable, without status epilepticus: Secondary | ICD-10-CM | POA: Diagnosis not present

## 2024-04-01 LAB — CBC
HCT: 46.4 % (ref 39.0–52.0)
Hemoglobin: 14.8 g/dL (ref 13.0–17.0)
MCH: 28.5 pg (ref 26.0–34.0)
MCHC: 31.9 g/dL (ref 30.0–36.0)
MCV: 89.2 fL (ref 80.0–100.0)
Platelets: 197 10*3/uL (ref 150–400)
RBC: 5.2 MIL/uL (ref 4.22–5.81)
RDW: 13.7 % (ref 11.5–15.5)
WBC: 4.3 10*3/uL (ref 4.0–10.5)
nRBC: 0 % (ref 0.0–0.2)
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
## 2024-04-01 NOTE — Plan of Care (Signed)
  Problem: Fluid Volume: Goal: Hemodynamic stability will improve Outcome: Progressing   Problem: Clinical Measurements: Goal: Signs and symptoms of infection will decrease Outcome: Progressing   Problem: Respiratory: Goal: Ability to maintain adequate ventilation will improve Outcome: Progressing   Problem: Respiratory: Goal: Ability to maintain a clear airway and adequate ventilation will improve Outcome: Progressing   Problem: Safety: Goal: Ability to remain free from injury will improve Outcome: Progressing   Problem: Skin Integrity: Goal: Risk for impaired skin integrity will decrease Outcome: Progressing
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
## 2024-04-01 NOTE — TOC Progression Note (Signed)
 Transition of Care [facility]) - Progression Note    Patient Details  Name: Alex [name] MRN: [id number] Date of Birth: [birth year]  [facility] CM/SW Contact  Alexandra Ice, RN Phone Number: 04/01/2024, 12:01 PM  Clinical Narrative:    Patient remains in mittens, due to behaviors, patient difficult placement.     Expected Discharge Plan: Skilled Nursing Facility Barriers to Discharge: Continued Medical Work up  Expected Discharge Plan and Services       Living arrangements for the past 2 months: Skilled Nursing Facility                                       Social Determinants of Health (SDOH) Interventions SDOH Screenings   Food Insecurity: Patient Unable To Answer (11/20/2023)  Housing: Patient Unable To Answer (11/20/2023)  Transportation Needs: Patient Unable To Answer (11/20/2023)  Utilities: Patient Unable To Answer (11/20/2023)  Financial Resource Strain: Low Risk  (12/02/2021)   Received from [facility]  Tobacco Use: Low Risk  (11/19/2023)    Readmission Risk Interventions     No data to display

## 2024-04-02 DIAGNOSIS — Z87820 Personal history of traumatic brain injury: Secondary | ICD-10-CM | POA: Diagnosis not present
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
## 2024-04-03 DIAGNOSIS — Z87820 Personal history of traumatic brain injury: Secondary | ICD-10-CM | POA: Diagnosis not present

## 2024-04-03 NOTE — Plan of Care (Signed)
  Problem: Respiratory: Goal: Ability to maintain adequate ventilation will improve Outcome: Progressing   Problem: Clinical Measurements: Goal: Respiratory complications will improve Outcome: Progressing   Problem: Nutrition: Goal: Adequate nutrition will be maintained Outcome: Progressing   Problem: Safety: Goal: Ability to remain free from injury will improve Outcome: Progressing   Problem: Respiratory: Goal: Ability to maintain a clear airway and adequate ventilation will improve Outcome: Progressing
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
## 2024-04-04 DIAGNOSIS — Z87820 Personal history of traumatic brain injury: Secondary | ICD-10-CM | POA: Diagnosis not present

## 2024-04-04 NOTE — TOC Progression Note (Signed)
 Transition of Care [facility]) - Progression Note    Patient Details  Name: Alex [name] MRN: [id number] Date of Birth: 01-02-1996  Transition [facility]) CM/SW Contact  Zoe Hinds, RN Phone Number: 04/04/2024, 4:54 PM  Clinical Narrative:    This CM called and spoke to Admission liaison [place] Batot @[phone]   on referrals sent to her facilities for pt that accept trach needs  the SNFs under her purview: that were discussed  for bed offers  included [facility]. This CM updated by Ms. Batot due to pt's  continued need for hand mitts regarding his  safety her facilities are unable to accommodate pt's need at this time . TOC will cont to follow dc planning / care coordination to secure a safe dc plan and update as applicable.       Expected Discharge Plan: Skilled Nursing Facility Barriers to Discharge: Continued Medical Work up  Expected Discharge Plan and Services    SNF that accepts pt's with trach & TBI needs .     Living arrangements for the past 2 months: Skilled Nursing Facility                  Social Determinants of Health (SDOH) Interventions SDOH Screenings   Food Insecurity: Patient Unable To Answer (11/20/2023)  Housing: Patient Unable To Answer (11/20/2023)  Transportation Needs: Patient Unable To Answer (11/20/2023)  Utilities: Patient Unable To Answer (11/20/2023)  Financial Resource Strain: Low Risk  (12/02/2021)   Received from [facility]  Tobacco Use: Low Risk  (11/19/2023)    Readmission Risk Interventions     No data to display
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
## 2024-04-04 NOTE — Plan of Care (Signed)
  Problem: Clinical Measurements: Goal: Ability to maintain clinical measurements within normal limits will improve Outcome: Progressing   Problem: Nutrition: Goal: Adequate nutrition will be maintained Outcome: Progressing   Problem: Safety: Goal: Ability to remain free from injury will improve Outcome: Progressing   

## 2024-04-04 NOTE — Plan of Care (Signed)
  Problem: Fluid Volume: Goal: Hemodynamic stability will improve Outcome: Progressing   Problem: Clinical Measurements: Goal: Signs and symptoms of infection will decrease Outcome: Progressing   Problem: Respiratory: Goal: Ability to maintain adequate ventilation will improve Outcome: Progressing   Problem: Activity: Goal: Ability to tolerate increased activity will improve Outcome: Progressing   Problem: Respiratory: Goal: Ability to maintain a clear airway and adequate ventilation will improve Outcome: Progressing   Problem: Role Relationship: Goal: Method of communication will improve Outcome: Progressing   Problem: Clinical Measurements: Goal: Ability to maintain clinical measurements within normal limits will improve Outcome: Progressing Goal: Will remain free from infection Outcome: Progressing Goal: Diagnostic test results will improve Outcome: Progressing Goal: Respiratory complications will improve Outcome: Progressing Goal: Cardiovascular complication will be avoided Outcome: Progressing   Problem: Activity: Goal: Risk for activity intolerance will decrease Outcome: Progressing   Problem: Nutrition: Goal: Adequate nutrition will be maintained Outcome: Progressing   Problem: Coping: Goal: Level of anxiety will decrease Outcome: Progressing   Problem: Elimination: Goal: Will not experience complications related to bowel motility Outcome: Progressing Goal: Will not experience complications related to urinary retention Outcome: Progressing   Problem: Pain Managment: Goal: General experience of comfort will improve and/or be controlled Outcome: Progressing   Problem: Safety: Goal: Ability to remain free from injury will improve Outcome: Progressing   Problem: Skin Integrity: Goal: Risk for impaired skin integrity will decrease Outcome: Progressing   Problem: Activity: Goal: Ability to tolerate increased activity will improve Outcome:  Progressing   Problem: Respiratory: Goal: Ability to maintain a clear airway and adequate ventilation will improve Outcome: Progressing   Problem: Role Relationship: Goal: Method of communication will improve Outcome: Progressing

## 2024-04-05 DIAGNOSIS — Z87820 Personal history of traumatic brain injury: Secondary | ICD-10-CM | POA: Diagnosis not present
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
## 2024-04-05 NOTE — Plan of Care (Signed)
  Problem: Clinical Measurements: Goal: Ability to maintain clinical measurements within normal limits will improve Outcome: Progressing   Problem: Activity: Goal: Risk for activity intolerance will decrease Outcome: Progressing   Problem: Nutrition: Goal: Adequate nutrition will be maintained Outcome: Progressing   Problem: Safety: Goal: Ability to remain free from injury will improve Outcome: Progressing   

## 2024-04-05 NOTE — Plan of Care (Signed)
  Problem: Respiratory: Goal: Ability to maintain adequate ventilation will improve Outcome: Progressing   Problem: Respiratory: Goal: Ability to maintain a clear airway and adequate ventilation will improve Outcome: Progressing   Problem: Clinical Measurements: Goal: Diagnostic test results will improve Outcome: Progressing   Problem: Respiratory: Goal: Ability to maintain a clear airway and adequate ventilation will improve Outcome: Progressing

## 2024-04-06 DIAGNOSIS — Z87820 Personal history of traumatic brain injury: Secondary | ICD-10-CM | POA: Diagnosis not present
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
## 2024-04-06 NOTE — Plan of Care (Signed)
  Problem: Fluid Volume: Goal: Hemodynamic stability will improve Outcome: Progressing   Problem: Clinical Measurements: Goal: Signs and symptoms of infection will decrease Outcome: Progressing   Problem: Respiratory: Goal: Ability to maintain adequate ventilation will improve Outcome: Progressing   

## 2024-04-06 NOTE — Plan of Care (Signed)
  Problem: Fluid Volume: Goal: Hemodynamic stability will improve Outcome: Progressing   Problem: Clinical Measurements: Goal: Signs and symptoms of infection will decrease Outcome: Progressing   Problem: Respiratory: Goal: Ability to maintain adequate ventilation will improve Outcome: Progressing   Problem: Activity: Goal: Ability to tolerate increased activity will improve Outcome: Progressing   Problem: Respiratory: Goal: Ability to maintain a clear airway and adequate ventilation will improve Outcome: Progressing   Problem: Role Relationship: Goal: Method of communication will improve Outcome: Progressing   Problem: Clinical Measurements: Goal: Ability to maintain clinical measurements within normal limits will improve Outcome: Progressing Goal: Will remain free from infection Outcome: Progressing Goal: Diagnostic test results will improve Outcome: Progressing Goal: Respiratory complications will improve Outcome: Progressing Goal: Cardiovascular complication will be avoided Outcome: Progressing   Problem: Activity: Goal: Risk for activity intolerance will decrease Outcome: Progressing   Problem: Nutrition: Goal: Adequate nutrition will be maintained Outcome: Progressing   Problem: Coping: Goal: Level of anxiety will decrease Outcome: Progressing   Problem: Elimination: Goal: Will not experience complications related to bowel motility Outcome: Progressing Goal: Will not experience complications related to urinary retention Outcome: Progressing   Problem: Pain Managment: Goal: General experience of comfort will improve and/or be controlled Outcome: Progressing   Problem: Safety: Goal: Ability to remain free from injury will improve Outcome: Progressing   Problem: Skin Integrity: Goal: Risk for impaired skin integrity will decrease Outcome: Progressing   Problem: Activity: Goal: Ability to tolerate increased activity will improve Outcome:  Progressing   Problem: Respiratory: Goal: Ability to maintain a clear airway and adequate ventilation will improve Outcome: Progressing   Problem: Role Relationship: Goal: Method of communication will improve Outcome: Progressing

## 2024-04-06 NOTE — Plan of Care (Signed)
   Problem: Clinical Measurements: Goal: Ability to maintain clinical measurements within normal limits will improve Outcome: Progressing   Problem: Activity: Goal: Risk for activity intolerance will decrease Outcome: Progressing   Problem: Nutrition: Goal: Adequate nutrition will be maintained Outcome: Progressing

## 2024-04-07 DIAGNOSIS — Z87820 Personal history of traumatic brain injury: Secondary | ICD-10-CM | POA: Diagnosis not present

## 2024-04-07 NOTE — TOC Progression Note (Addendum)
 Transition of Care [facility]) - Progression Note    Patient Details  Name: Alex [name] MRN: [id number] Date of Birth: [birth year]  Transition of Care [facility]) CM/SW Contact  Odilia Bennett, LCSW Phone Number: 04/07/2024, 11:45 AM  Clinical Narrative:  Left voicemail for [facility] admissions coordinator. Spoke to admissions coordinator at [facility]. He asked that CSW fax referral again. Will send when able.   1:55 pm: [facility] admissions coordinator is checking to see if she received the referral. CSW faxed referral to [facility] admissions coordinator.  Expected Discharge Plan: Skilled Nursing Facility Barriers to Discharge: Continued Medical Work up  Expected Discharge Plan and Services       Living arrangements for the past 2 months: Skilled Nursing Facility                                       Social Determinants of Health (SDOH) Interventions SDOH Screenings   Food Insecurity: Patient Unable To Answer (11/20/2023)  Housing: Patient Unable To Answer (11/20/2023)  Transportation Needs: Patient Unable To Answer (11/20/2023)  Utilities: Patient Unable To Answer (11/20/2023)  Financial Resource Strain: Low Risk  (12/02/2021)   Received from [facility]  Tobacco Use: Low Risk  (11/19/2023)    Readmission Risk Interventions     No data to display
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
## 2024-04-07 NOTE — NC FL2 (Signed)
 [place]  MEDICAID FL2 LEVEL OF CARE FORM     IDENTIFICATION  Patient Name: Alex [name] Birthdate: 12-Sep-1996 Sex: male Admission Date (Current Location): 11/19/2023  [place] and [place] Number:  Chiropodist and Address:  [facility], [address], [place], [place] [address]      Provider Number: [id number]  Attending Physician Name and Address:  Montey Apa, DO  Relative Name and Phone Number:       Current Level of Care: Hospital Recommended Level of Care: Skilled Nursing Facility Prior Approval Number:    Date Approved/Denied:   PASRR Number: [id number]  Discharge Plan: SNF    Current Diagnoses: Patient Active Problem List   Diagnosis Date Noted   Aspiration pneumonia of right lung (HCC) 02/17/2024   Sepsis (HCC) 02/17/2024   Anemia, unspecified 01/08/2024   History of traumatic brain injury 12/20/2023   Hypotension 12/19/2023   Pressure injury of skin 11/26/2023   Acute hypoxic respiratory failure (HCC) 11/20/2023   Seizure disorder (HCC) 11/20/2023   Seizure (HCC) 09/11/2021   Status epilepticus (HCC) 05/31/2021   Traumatic brain injury (HCC) 05/31/2021   Malnutrition of moderate degree 05/31/2021    Orientation RESPIRATION BLADDER Height & Weight     Self  Normal, Tracheostomy (Shiley flexible 6 mm uncuffed.) Incontinent, External catheter Weight: 165 lb 2 oz (74.9 kg) Height:  5' 9.02 (175.3 cm)  BEHAVIORAL SYMPTOMS/MOOD NEUROLOGICAL BOWEL NUTRITION STATUS  Other (Comment) (Irritable)  (Seizure disorder, TBI) Incontinent Feeding tube  AMBULATORY STATUS COMMUNICATION OF NEEDS Skin     Non-Verbally PU Stage and Appropriate Care, Other (Comment), Skin abrasions, Bruising, Surgical wounds (Blister, erythema/redness, rash, scratch marks. Incision on throat: Gauze. MASD on scrotum: No dressing.) PU Stage 1 Dressing:  (Left buttocks and right foot: Foam.)                     Personal Care Assistance Level  of Assistance              Functional Limitations Info  Sight, Hearing, Speech Sight Info: Adequate Hearing Info: Adequate Speech Info: Impaired (Per RN assessment, you can occasionally understand what he says.)    SPECIAL CARE FACTORS FREQUENCY                       Contractures Contractures Info: Present    Additional Factors Info  Code Status, Allergies Code Status Info: Full code Allergies Info: NKDA     Isolation Precautions Info: Contact: MRSA     Current Medications (04/07/2024):  This is the current hospital active medication list Current Facility-Administered Medications  Medication Dose Route Frequency Provider Last Rate Last Admin   acetaminophen  (TYLENOL ) tablet 650 mg  650 mg Per Tube Q6H PRN Rust-Chester, Britton L, NP   650 mg at 04/01/24 1712   Or   acetaminophen  (TYLENOL ) suppository 650 mg  650 mg Rectal Q6H PRN Rust-Chester, Britton L, NP   650 mg at 11/21/23 0230   baclofen  (LIORESAL ) tablet 10 mg  10 mg Per Tube TID Rust-Chester, Gara July, NP   10 mg at 04/06/24 2207   bisacodyl  (DULCOLAX) suppository 10 mg  10 mg Rectal Daily PRN Assaker, Marianne Shirts, MD   10 mg at 12/06/23 1054   enoxaparin  (LOVENOX ) injection 40 mg  40 mg Subcutaneous Q24H Nelson, Dana G, NP   40 mg at 04/06/24 2207   famotidine  (PEPCID ) 40 MG/5ML suspension 20 mg  20 mg Per Tube  BID Verla Glaze, MD   20 mg at 04/06/24 2208   feeding supplement (OSMOLITE 1.5 CAL) liquid 1,000 mL  1,000 mL Per Tube Continuous Assaker, Jean-Pierre, MD 60 mL/hr at 04/07/24 0609 1,000 mL at 04/07/24 0609   feeding supplement (PROSource TF20) liquid 60 mL  60 mL Per Tube Daily Dgayli, Khabib, MD   60 mL at 04/06/24 1010   fiber supplement (BANATROL TF) liquid 60 mL  60 mL Per Tube BID Darus Engels A, DO   60 mL at 04/06/24 2208   free water  30 mL  30 mL Per Tube Q4H Dobbs, Andrea K, RPH   30 mL at 04/07/24 0400   gabapentin  (NEURONTIN ) 250 MG/5ML solution 300 mg  300 mg Per Tube Q8H Chappell,  Alex B, RPH   300 mg at 04/07/24 5784   Gerhardt's butt cream   Topical TID Amin, Sumayya, MD   Given at 04/06/24 2208   glycopyrrolate  (ROBINUL ) injection 0.2 mg  0.2 mg Intravenous QID PRN Kasa, Kurian, MD   0.2 mg at 12/19/23 1205   lacosamide  (VIMPAT ) tablet 100 mg  100 mg Per Tube BID Grubb, Rodney D, RPH   100 mg at 04/06/24 2207   levalbuterol  (XOPENEX ) nebulizer solution 0.63 mg  0.63 mg Nebulization Q6H PRN Kasa, Kurian, MD   0.63 mg at 03/13/24 1105   lip balm (BLISTEX) ointment   Topical PRN Gideon Kussmaul, NP   Given at 02/01/24 1621   loperamide  (IMODIUM ) capsule 2 mg  2 mg Oral Q8H PRN Verla Glaze, MD   2 mg at 04/05/24 6962   metoprolol  tartrate (LOPRESSOR ) tablet 12.5 mg  12.5 mg Per Tube BID Verla Glaze, MD   12.5 mg at 04/06/24 2207   morphine  (PF) 2 MG/ML injection 2 mg  2 mg Subcutaneous Q4H PRN Elisabeth Guild, NP   2 mg at 02/01/24 9528   nutrition supplement (JUVEN) (JUVEN) powder packet 1 packet  1 packet Per Tube BID BM Dgayli, Khabib, MD   1 packet at 04/06/24 1522   ondansetron  (ZOFRAN ) tablet 4 mg  4 mg Per Tube Q6H PRN Rust-Chester, Gara July, NP       Or   ondansetron  (ZOFRAN ) injection 4 mg  4 mg Intravenous Q6H PRN Rust-Chester, Gara July, NP       Oral care mouth rinse  15 mL Mouth Rinse PRN Luna Salinas, MD       oxyCODONE  (Oxy IR/ROXICODONE ) immediate release tablet 5 mg  5 mg Per Tube Q6H PRN Sreeram, Narendranath, MD   5 mg at 04/01/24 2101   QUEtiapine  (SEROQUEL ) tablet 50 mg  50 mg Per Tube TID Kasa, Kurian, MD   50 mg at 04/06/24 2207   valproic  acid (DEPAKENE ) 250 MG/5ML solution 500 mg  500 mg Per Tube Daily Adalberto Acton, RPH   500 mg at 04/06/24 1010   valproic  acid (DEPAKENE ) 250 MG/5ML solution 750 mg  750 mg Per Tube QHS Adalberto Acton, RPH   750 mg at 04/06/24 2208     Discharge Medications: Please see discharge summary for a list of discharge medications.  Relevant Imaging Results:  Relevant Lab  Results:   Additional Information SS#: [id number]  Odilia Bennett, LCSW

## 2024-04-07 NOTE — Plan of Care (Signed)
  Problem: Fluid Volume: Goal: Hemodynamic stability will improve Outcome: Progressing   Problem: Clinical Measurements: Goal: Signs and symptoms of infection will decrease Outcome: Progressing

## 2024-04-08 DIAGNOSIS — Z87820 Personal history of traumatic brain injury: Secondary | ICD-10-CM | POA: Diagnosis not present
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
## 2024-04-08 NOTE — TOC Progression Note (Addendum)
 Transition of Care [facility]) - Progression Note    Patient Details  Name: Alex [name] MRN: [id number] Date of Birth: 1996/02/23  Transition of [facility] CM/SW Contact  Zoe Hinds, RN Phone Number: 04/08/2024, 1:38 PM  Clinical Narrative:    This CM called and spoke with liaison Angie at [facility]  & Brigid Canada at [facility] regarding bed referral updated they can not accept pt at this time due to current clinical needs and insurance payor source . This CM attempted to reach pt's mother per request of medical team @x  [phone] and was sent to VM.This CM also attempted to reach pt's grandmother Alleen Isle  @[phone] and was unsuccessful with reaching x 2.  TOC will cont to follow dc planning / care coordination and update as applicable.    Expected Discharge Plan: Skilled Nursing Facility Barriers to Discharge: Continued Medical Work up  Expected Discharge Plan and Services    Trach Facility    Living arrangements for the past 2 months: Skilled Nursing Facility                  Social Determinants of Health (SDOH) Interventions SDOH Screenings   Food Insecurity: Patient Unable To Answer (11/20/2023)  Housing: Patient Unable To Answer (11/20/2023)  Transportation Needs: Patient Unable To Answer (11/20/2023)  Utilities: Patient Unable To Answer (11/20/2023)  Financial Resource Strain: Low Risk  (12/02/2021)   Received from [facility]  Tobacco Use: Low Risk  (11/19/2023)    Readmission Risk Interventions     No data to display
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
## 2024-04-09 DIAGNOSIS — Z87820 Personal history of traumatic brain injury: Secondary | ICD-10-CM | POA: Diagnosis not present

## 2024-04-09 NOTE — Plan of Care (Signed)
  Problem: Fluid Volume: Goal: Hemodynamic stability will improve Outcome: Progressing   Problem: Clinical Measurements: Goal: Signs and symptoms of infection will decrease Outcome: Progressing   Problem: Respiratory: Goal: Ability to maintain adequate ventilation will improve Outcome: Progressing   Problem: Activity: Goal: Ability to tolerate increased activity will improve Outcome: Progressing   Problem: Respiratory: Goal: Ability to maintain a clear airway and adequate ventilation will improve Outcome: Progressing   Problem: Role Relationship: Goal: Method of communication will improve Outcome: Progressing   Problem: Clinical Measurements: Goal: Ability to maintain clinical measurements within normal limits will improve Outcome: Progressing Goal: Will remain free from infection Outcome: Progressing Goal: Diagnostic test results will improve Outcome: Progressing Goal: Respiratory complications will improve Outcome: Progressing Goal: Cardiovascular complication will be avoided Outcome: Progressing   Problem: Activity: Goal: Risk for activity intolerance will decrease Outcome: Progressing   Problem: Nutrition: Goal: Adequate nutrition will be maintained Outcome: Progressing   Problem: Coping: Goal: Level of anxiety will decrease Outcome: Progressing   Problem: Elimination: Goal: Will not experience complications related to bowel motility Outcome: Progressing Goal: Will not experience complications related to urinary retention Outcome: Progressing   Problem: Pain Managment: Goal: General experience of comfort will improve and/or be controlled Outcome: Progressing   Problem: Safety: Goal: Ability to remain free from injury will improve Outcome: Progressing   Problem: Skin Integrity: Goal: Risk for impaired skin integrity will decrease Outcome: Progressing   Problem: Activity: Goal: Ability to tolerate increased activity will improve Outcome:  Progressing   Problem: Respiratory: Goal: Ability to maintain a clear airway and adequate ventilation will improve Outcome: Progressing   Problem: Role Relationship: Goal: Method of communication will improve Outcome: Progressing
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
## 2024-04-10 DIAGNOSIS — Z87820 Personal history of traumatic brain injury: Secondary | ICD-10-CM | POA: Diagnosis not present

## 2024-04-10 LAB — CBC WITH DIFFERENTIAL/PLATELET
Abs Immature Granulocytes: 0.01 10*3/uL (ref 0.00–0.07)
Basophils Absolute: 0 10*3/uL (ref 0.0–0.1)
Basophils Relative: 0 %
Eosinophils Absolute: 0.2 10*3/uL (ref 0.0–0.5)
Eosinophils Relative: 5 %
HCT: 47.7 % (ref 39.0–52.0)
Hemoglobin: 15.4 g/dL (ref 13.0–17.0)
Immature Granulocytes: 0 %
Lymphocytes Relative: 26 %
Lymphs Abs: 1.3 10*3/uL (ref 0.7–4.0)
MCH: 28.8 pg (ref 26.0–34.0)
MCHC: 32.3 g/dL (ref 30.0–36.0)
MCV: 89.2 fL (ref 80.0–100.0)
Monocytes Absolute: 0.4 10*3/uL (ref 0.1–1.0)
Monocytes Relative: 9 %
Neutro Abs: 3 10*3/uL (ref 1.7–7.7)
Neutrophils Relative %: 60 %
Platelets: 201 10*3/uL (ref 150–400)
RBC: 5.35 MIL/uL (ref 4.22–5.81)
RDW: 13.9 % (ref 11.5–15.5)
WBC: 5.1 10*3/uL (ref 4.0–10.5)
nRBC: 0 % (ref 0.0–0.2)

## 2024-04-10 LAB — BASIC METABOLIC PANEL WITH GFR
Anion gap: 9 (ref 5–15)
BUN: 20 mg/dL (ref 6–20)
CO2: 29 mmol/L (ref 22–32)
Calcium: 9.5 mg/dL (ref 8.9–10.3)
Chloride: 105 mmol/L (ref 98–111)
Creatinine, Ser: 0.56 mg/dL — ABNORMAL LOW (ref 0.61–1.24)
GFR, Estimated: >60 mL/min (ref >60)
Glucose, Bld: 97 mg/dL (ref 70–99)
Potassium: 4.8 mmol/L (ref 3.5–5.1)
Sodium: 143 mmol/L (ref 135–145)

## 2024-04-10 NOTE — Plan of Care (Signed)
  Problem: Clinical Measurements: Goal: Signs and symptoms of infection will decrease Outcome: Progressing   Problem: Respiratory: Goal: Ability to maintain adequate ventilation will improve Outcome: Progressing   Problem: Respiratory: Goal: Ability to maintain a clear airway and adequate ventilation will improve Outcome: Progressing   Problem: Respiratory: Goal: Ability to maintain a clear airway and adequate ventilation will improve Outcome: Progressing   Problem: Safety: Goal: Ability to remain free from injury will improve Outcome: Progressing
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
## 2024-04-10 NOTE — Plan of Care (Signed)
  Problem: Fluid Volume: Goal: Hemodynamic stability will improve Outcome: Progressing   Problem: Clinical Measurements: Goal: Signs and symptoms of infection will decrease Outcome: Progressing   Problem: Respiratory: Goal: Ability to maintain adequate ventilation will improve Outcome: Progressing   Problem: Activity: Goal: Ability to tolerate increased activity will improve Outcome: Progressing   Problem: Respiratory: Goal: Ability to maintain a clear airway and adequate ventilation will improve Outcome: Progressing   Problem: Role Relationship: Goal: Method of communication will improve Outcome: Progressing   Problem: Clinical Measurements: Goal: Ability to maintain clinical measurements within normal limits will improve Outcome: Progressing Goal: Will remain free from infection Outcome: Progressing Goal: Diagnostic test results will improve Outcome: Progressing Goal: Respiratory complications will improve Outcome: Progressing Goal: Cardiovascular complication will be avoided Outcome: Progressing   Problem: Activity: Goal: Risk for activity intolerance will decrease Outcome: Progressing   Problem: Nutrition: Goal: Adequate nutrition will be maintained Outcome: Progressing   Problem: Coping: Goal: Level of anxiety will decrease Outcome: Progressing   Problem: Elimination: Goal: Will not experience complications related to bowel motility Outcome: Progressing Goal: Will not experience complications related to urinary retention Outcome: Progressing   Problem: Pain Managment: Goal: General experience of comfort will improve and/or be controlled Outcome: Progressing   Problem: Safety: Goal: Ability to remain free from injury will improve Outcome: Progressing   Problem: Skin Integrity: Goal: Risk for impaired skin integrity will decrease Outcome: Progressing   Problem: Activity: Goal: Ability to tolerate increased activity will improve Outcome:  Progressing   Problem: Respiratory: Goal: Ability to maintain a clear airway and adequate ventilation will improve Outcome: Progressing   Problem: Role Relationship: Goal: Method of communication will improve Outcome: Progressing

## 2024-04-11 DIAGNOSIS — Z87820 Personal history of traumatic brain injury: Secondary | ICD-10-CM | POA: Diagnosis not present

## 2024-04-11 LAB — CBC WITH DIFFERENTIAL/PLATELET
Abs Immature Granulocytes: 0.01 10*3/uL (ref 0.00–0.07)
Basophils Absolute: 0 10*3/uL (ref 0.0–0.1)
Basophils Relative: 0 %
Eosinophils Absolute: 0.3 10*3/uL (ref 0.0–0.5)
Eosinophils Relative: 5 %
HCT: 48.4 % (ref 39.0–52.0)
Hemoglobin: 15.4 g/dL (ref 13.0–17.0)
Immature Granulocytes: 0 %
Lymphocytes Relative: 28 %
Lymphs Abs: 1.3 10*3/uL (ref 0.7–4.0)
MCH: 28.4 pg (ref 26.0–34.0)
MCHC: 31.8 g/dL (ref 30.0–36.0)
MCV: 89.1 fL (ref 80.0–100.0)
Monocytes Absolute: 0.4 10*3/uL (ref 0.1–1.0)
Monocytes Relative: 9 %
Neutro Abs: 2.8 10*3/uL (ref 1.7–7.7)
Neutrophils Relative %: 58 %
Platelets: 197 10*3/uL (ref 150–400)
RBC: 5.43 MIL/uL (ref 4.22–5.81)
RDW: 14 % (ref 11.5–15.5)
WBC: 4.8 10*3/uL (ref 4.0–10.5)
nRBC: 0 % (ref 0.0–0.2)

## 2024-04-11 LAB — BASIC METABOLIC PANEL WITH GFR
Anion gap: 8 (ref 5–15)
BUN: 20 mg/dL (ref 6–20)
CO2: 29 mmol/L (ref 22–32)
Calcium: 9.3 mg/dL (ref 8.9–10.3)
Chloride: 105 mmol/L (ref 98–111)
Creatinine, Ser: 0.48 mg/dL — ABNORMAL LOW (ref 0.61–1.24)
GFR, Estimated: >60 mL/min (ref >60)
Glucose, Bld: 86 mg/dL (ref 70–99)
Potassium: 4.1 mmol/L (ref 3.5–5.1)
Sodium: 142 mmol/L (ref 135–145)
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
## 2024-04-11 NOTE — Plan of Care (Signed)
  Problem: Clinical Measurements: Goal: Signs and symptoms of infection will decrease Outcome: Progressing   Problem: Respiratory: Goal: Ability to maintain adequate ventilation will improve Outcome: Progressing   

## 2024-04-11 NOTE — Progress Notes (Signed)
 Pt pulled trach out. Pt in no respiratory distress. Shiley flex number 6 trach placed back in with no complications, equal breath sounds, no bleeding .

## 2024-04-12 DIAGNOSIS — Z87820 Personal history of traumatic brain injury: Secondary | ICD-10-CM | POA: Diagnosis not present

## 2024-04-12 LAB — BASIC METABOLIC PANEL WITH GFR
Anion gap: 10 (ref 5–15)
BUN: 20 mg/dL (ref 6–20)
CO2: 27 mmol/L (ref 22–32)
Calcium: 8.9 mg/dL (ref 8.9–10.3)
Chloride: 102 mmol/L (ref 98–111)
Creatinine, Ser: 0.53 mg/dL — ABNORMAL LOW (ref 0.61–1.24)
GFR, Estimated: >60 mL/min (ref >60)
Glucose, Bld: 87 mg/dL (ref 70–99)
Potassium: 4.2 mmol/L (ref 3.5–5.1)
Sodium: 139 mmol/L (ref 135–145)

## 2024-04-12 LAB — CBC WITH DIFFERENTIAL/PLATELET
Abs Immature Granulocytes: 0.01 10*3/uL (ref 0.00–0.07)
Basophils Absolute: 0 10*3/uL (ref 0.0–0.1)
Basophils Relative: 1 %
Eosinophils Absolute: 0.3 10*3/uL (ref 0.0–0.5)
Eosinophils Relative: 7 %
HCT: 47.4 % (ref 39.0–52.0)
Hemoglobin: 14.7 g/dL (ref 13.0–17.0)
Immature Granulocytes: 0 %
Lymphocytes Relative: 25 %
Lymphs Abs: 1.1 10*3/uL (ref 0.7–4.0)
MCH: 27.9 pg (ref 26.0–34.0)
MCHC: 31 g/dL (ref 30.0–36.0)
MCV: 89.9 fL (ref 80.0–100.0)
Monocytes Absolute: 0.4 10*3/uL (ref 0.1–1.0)
Monocytes Relative: 9 %
Neutro Abs: 2.5 10*3/uL (ref 1.7–7.7)
Neutrophils Relative %: 58 %
Platelets: 193 10*3/uL (ref 150–400)
RBC: 5.27 MIL/uL (ref 4.22–5.81)
RDW: 14.1 % (ref 11.5–15.5)
WBC: 4.3 10*3/uL (ref 4.0–10.5)
nRBC: 0 % (ref 0.0–0.2)

## 2024-04-12 NOTE — Plan of Care (Signed)
  Problem: Fluid Volume: Goal: Hemodynamic stability will improve Outcome: Progressing   Problem: Respiratory: Goal: Ability to maintain adequate ventilation will improve Outcome: Progressing   Problem: Respiratory: Goal: Ability to maintain a clear airway and adequate ventilation will improve Outcome: Progressing   Problem: Clinical Measurements: Goal: Diagnostic test results will improve Outcome: Progressing   Problem: Nutrition: Goal: Adequate nutrition will be maintained Outcome: Progressing   Problem: Respiratory: Goal: Ability to maintain a clear airway and adequate ventilation will improve Outcome: Progressing
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
## 2024-04-13 DIAGNOSIS — Z87820 Personal history of traumatic brain injury: Secondary | ICD-10-CM | POA: Diagnosis not present

## 2024-04-13 LAB — CBC WITH DIFFERENTIAL/PLATELET
Abs Immature Granulocytes: 0.02 10*3/uL (ref 0.00–0.07)
Basophils Absolute: 0 10*3/uL (ref 0.0–0.1)
Basophils Relative: 0 %
Eosinophils Absolute: 0.3 10*3/uL (ref 0.0–0.5)
Eosinophils Relative: 6 %
HCT: 46.5 % (ref 39.0–52.0)
Hemoglobin: 14.8 g/dL (ref 13.0–17.0)
Immature Granulocytes: 0 %
Lymphocytes Relative: 23 %
Lymphs Abs: 1.3 10*3/uL (ref 0.7–4.0)
MCH: 28.7 pg (ref 26.0–34.0)
MCHC: 31.8 g/dL (ref 30.0–36.0)
MCV: 90.1 fL (ref 80.0–100.0)
Monocytes Absolute: 0.5 10*3/uL (ref 0.1–1.0)
Monocytes Relative: 8 %
Neutro Abs: 3.4 10*3/uL (ref 1.7–7.7)
Neutrophils Relative %: 63 %
Platelets: 202 10*3/uL (ref 150–400)
RBC: 5.16 MIL/uL (ref 4.22–5.81)
RDW: 14.1 % (ref 11.5–15.5)
WBC: 5.4 10*3/uL (ref 4.0–10.5)
nRBC: 0 % (ref 0.0–0.2)

## 2024-04-13 LAB — BASIC METABOLIC PANEL WITH GFR
Anion gap: 7 (ref 5–15)
BUN: 17 mg/dL (ref 6–20)
CO2: 28 mmol/L (ref 22–32)
Calcium: 9.1 mg/dL (ref 8.9–10.3)
Chloride: 105 mmol/L (ref 98–111)
Creatinine, Ser: 0.51 mg/dL — ABNORMAL LOW (ref 0.61–1.24)
GFR, Estimated: >60 mL/min (ref >60)
Glucose, Bld: 85 mg/dL (ref 70–99)
Potassium: 4.6 mmol/L (ref 3.5–5.1)
Sodium: 140 mmol/L (ref 135–145)
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
## 2024-04-13 NOTE — Plan of Care (Signed)
  Problem: Respiratory: Goal: Ability to maintain a clear airway and adequate ventilation will improve Outcome: Progressing   Problem: Clinical Measurements: Goal: Diagnostic test results will improve Outcome: Progressing   Problem: Nutrition: Goal: Adequate nutrition will be maintained Outcome: Progressing   Problem: Respiratory: Goal: Ability to maintain a clear airway and adequate ventilation will improve Outcome: Progressing

## 2024-04-14 DIAGNOSIS — Z87820 Personal history of traumatic brain injury: Secondary | ICD-10-CM | POA: Diagnosis not present

## 2024-04-14 LAB — CBC WITH DIFFERENTIAL/PLATELET
Abs Immature Granulocytes: 0.02 10*3/uL (ref 0.00–0.07)
Basophils Absolute: 0 10*3/uL (ref 0.0–0.1)
Basophils Relative: 0 %
Eosinophils Absolute: 0.3 10*3/uL (ref 0.0–0.5)
Eosinophils Relative: 7 %
HCT: 45.7 % (ref 39.0–52.0)
Hemoglobin: 14.8 g/dL (ref 13.0–17.0)
Immature Granulocytes: 0 %
Lymphocytes Relative: 31 %
Lymphs Abs: 1.5 10*3/uL (ref 0.7–4.0)
MCH: 29 pg (ref 26.0–34.0)
MCHC: 32.4 g/dL (ref 30.0–36.0)
MCV: 89.6 fL (ref 80.0–100.0)
Monocytes Absolute: 0.4 10*3/uL (ref 0.1–1.0)
Monocytes Relative: 9 %
Neutro Abs: 2.5 10*3/uL (ref 1.7–7.7)
Neutrophils Relative %: 53 %
Platelets: 187 10*3/uL (ref 150–400)
RBC: 5.1 MIL/uL (ref 4.22–5.81)
RDW: 14 % (ref 11.5–15.5)
WBC: 4.8 10*3/uL (ref 4.0–10.5)
nRBC: 0 % (ref 0.0–0.2)

## 2024-04-14 LAB — BASIC METABOLIC PANEL WITH GFR
Anion gap: 10 (ref 5–15)
BUN: 22 mg/dL — ABNORMAL HIGH (ref 6–20)
CO2: 25 mmol/L (ref 22–32)
Calcium: 9.1 mg/dL (ref 8.9–10.3)
Chloride: 105 mmol/L (ref 98–111)
Creatinine, Ser: 0.51 mg/dL — ABNORMAL LOW (ref 0.61–1.24)
GFR, Estimated: >60 mL/min (ref >60)
Glucose, Bld: 106 mg/dL — ABNORMAL HIGH (ref 70–99)
Potassium: 4.5 mmol/L (ref 3.5–5.1)
Sodium: 140 mmol/L (ref 135–145)
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
## 2024-04-14 NOTE — Plan of Care (Signed)
  Problem: Fluid Volume: Goal: Hemodynamic stability will improve Outcome: Progressing   Problem: Clinical Measurements: Goal: Signs and symptoms of infection will decrease Outcome: Progressing   Problem: Respiratory: Goal: Ability to maintain adequate ventilation will improve Outcome: Progressing   Problem: Respiratory: Goal: Ability to maintain a clear airway and adequate ventilation will improve Outcome: Progressing   Problem: Clinical Measurements: Goal: Ability to maintain clinical measurements within normal limits will improve Outcome: Progressing Goal: Will remain free from infection Outcome: Progressing Goal: Diagnostic test results will improve Outcome: Progressing Goal: Respiratory complications will improve Outcome: Progressing Goal: Cardiovascular complication will be avoided Outcome: Progressing   Problem: Nutrition: Goal: Adequate nutrition will be maintained Outcome: Progressing   Problem: Elimination: Goal: Will not experience complications related to bowel motility Outcome: Progressing Goal: Will not experience complications related to urinary retention Outcome: Progressing   Problem: Pain Managment: Goal: General experience of comfort will improve and/or be controlled Outcome: Progressing   Problem: Safety: Goal: Ability to remain free from injury will improve Outcome: Progressing   Problem: Respiratory: Goal: Ability to maintain a clear airway and adequate ventilation will improve Outcome: Progressing   Problem: Activity: Goal: Ability to tolerate increased activity will improve Outcome: Not Progressing   Problem: Role Relationship: Goal: Method of communication will improve Outcome: Not Progressing   Problem: Activity: Goal: Risk for activity intolerance will decrease Outcome: Not Progressing   Problem: Coping: Goal: Level of anxiety will decrease Outcome: Not Progressing   Problem: Skin Integrity: Goal: Risk for impaired skin  integrity will decrease Outcome: Not Progressing   Problem: Activity: Goal: Ability to tolerate increased activity will improve Outcome: Not Progressing   Problem: Role Relationship: Goal: Method of communication will improve Outcome: Not Progressing

## 2024-04-15 DIAGNOSIS — Z87820 Personal history of traumatic brain injury: Secondary | ICD-10-CM | POA: Diagnosis not present

## 2024-04-15 LAB — CBC WITH DIFFERENTIAL/PLATELET
Abs Immature Granulocytes: 0 10*3/uL (ref 0.00–0.07)
Basophils Absolute: 0 10*3/uL (ref 0.0–0.1)
Basophils Relative: 0 %
Eosinophils Absolute: 0.3 10*3/uL (ref 0.0–0.5)
Eosinophils Relative: 6 %
HCT: 46.4 % (ref 39.0–52.0)
Hemoglobin: 15 g/dL (ref 13.0–17.0)
Immature Granulocytes: 0 %
Lymphocytes Relative: 29 %
Lymphs Abs: 1.5 10*3/uL (ref 0.7–4.0)
MCH: 29 pg (ref 26.0–34.0)
MCHC: 32.3 g/dL (ref 30.0–36.0)
MCV: 89.7 fL (ref 80.0–100.0)
Monocytes Absolute: 0.5 10*3/uL (ref 0.1–1.0)
Monocytes Relative: 10 %
Neutro Abs: 2.7 10*3/uL (ref 1.7–7.7)
Neutrophils Relative %: 55 %
Platelets: 206 10*3/uL (ref 150–400)
RBC: 5.17 MIL/uL (ref 4.22–5.81)
RDW: 14 % (ref 11.5–15.5)
WBC: 5 10*3/uL (ref 4.0–10.5)
nRBC: 0 % (ref 0.0–0.2)

## 2024-04-15 LAB — BASIC METABOLIC PANEL WITH GFR
Anion gap: 6 (ref 5–15)
BUN: 19 mg/dL (ref 6–20)
CO2: 29 mmol/L (ref 22–32)
Calcium: 8.9 mg/dL (ref 8.9–10.3)
Chloride: 105 mmol/L (ref 98–111)
Creatinine, Ser: 0.57 mg/dL — ABNORMAL LOW (ref 0.61–1.24)
GFR, Estimated: >60 mL/min (ref >60)
Glucose, Bld: 84 mg/dL (ref 70–99)
Potassium: 4.2 mmol/L (ref 3.5–5.1)
Sodium: 140 mmol/L (ref 135–145)
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
## 2024-04-15 NOTE — Plan of Care (Signed)
  Problem: Fluid Volume: Goal: Hemodynamic stability will improve Outcome: Not Progressing   Problem: Clinical Measurements: Goal: Signs and symptoms of infection will decrease Outcome: Not Progressing   Problem: Respiratory: Goal: Ability to maintain adequate ventilation will improve Outcome: Not Progressing   Problem: Activity: Goal: Ability to tolerate increased activity will improve Outcome: Not Progressing   Problem: Respiratory: Goal: Ability to maintain a clear airway and adequate ventilation will improve Outcome: Not Progressing   Problem: Role Relationship: Goal: Method of communication will improve Outcome: Not Progressing   Problem: Clinical Measurements: Goal: Ability to maintain clinical measurements within normal limits will improve Outcome: Not Progressing Goal: Will remain free from infection Outcome: Not Progressing Goal: Diagnostic test results will improve Outcome: Not Progressing Goal: Respiratory complications will improve Outcome: Not Progressing Goal: Cardiovascular complication will be avoided Outcome: Not Progressing   Problem: Activity: Goal: Risk for activity intolerance will decrease Outcome: Not Progressing   Problem: Nutrition: Goal: Adequate nutrition will be maintained Outcome: Not Progressing   Problem: Coping: Goal: Level of anxiety will decrease Outcome: Not Progressing   Problem: Elimination: Goal: Will not experience complications related to bowel motility Outcome: Not Progressing Goal: Will not experience complications related to urinary retention Outcome: Not Progressing   Problem: Pain Managment: Goal: General experience of comfort will improve and/or be controlled Outcome: Not Progressing   Problem: Safety: Goal: Ability to remain free from injury will improve Outcome: Not Progressing   Problem: Skin Integrity: Goal: Risk for impaired skin integrity will decrease Outcome: Not Progressing   Problem:  Activity: Goal: Ability to tolerate increased activity will improve Outcome: Not Progressing   Problem: Respiratory: Goal: Ability to maintain a clear airway and adequate ventilation will improve Outcome: Not Progressing   Problem: Role Relationship: Goal: Method of communication will improve Outcome: Not Progressing

## 2024-04-16 DIAGNOSIS — Z87820 Personal history of traumatic brain injury: Secondary | ICD-10-CM | POA: Diagnosis not present

## 2024-04-16 NOTE — Plan of Care (Signed)
  Problem: Fluid Volume: Goal: Hemodynamic stability will improve Outcome: Progressing   Problem: Clinical Measurements: Goal: Signs and symptoms of infection will decrease Outcome: Progressing   Problem: Respiratory: Goal: Ability to maintain adequate ventilation will improve Outcome: Progressing   Problem: Activity: Goal: Ability to tolerate increased activity will improve Outcome: Progressing   Problem: Respiratory: Goal: Ability to maintain a clear airway and adequate ventilation will improve Outcome: Progressing   Problem: Role Relationship: Goal: Method of communication will improve Outcome: Progressing   Problem: Clinical Measurements: Goal: Ability to maintain clinical measurements within normal limits will improve Outcome: Progressing Goal: Will remain free from infection Outcome: Progressing Goal: Diagnostic test results will improve Outcome: Progressing Goal: Respiratory complications will improve Outcome: Progressing Goal: Cardiovascular complication will be avoided Outcome: Progressing   Problem: Activity: Goal: Risk for activity intolerance will decrease Outcome: Progressing   Problem: Nutrition: Goal: Adequate nutrition will be maintained Outcome: Progressing   Problem: Coping: Goal: Level of anxiety will decrease Outcome: Progressing   Problem: Elimination: Goal: Will not experience complications related to bowel motility Outcome: Progressing Goal: Will not experience complications related to urinary retention Outcome: Progressing   Problem: Pain Managment: Goal: General experience of comfort will improve and/or be controlled Outcome: Progressing   Problem: Safety: Goal: Ability to remain free from injury will improve Outcome: Progressing   Problem: Skin Integrity: Goal: Risk for impaired skin integrity will decrease Outcome: Progressing   Problem: Activity: Goal: Ability to tolerate increased activity will improve Outcome:  Progressing   Problem: Respiratory: Goal: Ability to maintain a clear airway and adequate ventilation will improve Outcome: Progressing   Problem: Role Relationship: Goal: Method of communication will improve Outcome: Progressing

## 2024-04-16 NOTE — TOC Progression Note (Signed)
 Transition of Care [facility]) - Progression Note    Patient Details  Name: Alex [name] MRN: [id number] Date of Birth: 1995/11/29  [facility] CM/SW Contact  Quintella Suzen Jansky, RN Phone Number: 04/16/2024, 10:34 AM  Clinical Narrative:     Patient still has no accepting facilities at this time.  Expected Discharge Plan: Skilled Nursing Facility Barriers to Discharge: Continued Medical Work up  Expected Discharge Plan and Services       Living arrangements for the past 2 months: Skilled Nursing Facility                                       Social Determinants of Health (SDOH) Interventions SDOH Screenings   Food Insecurity: Patient Unable To Answer (11/20/2023)  Housing: Patient Unable To Answer (11/20/2023)  Transportation Needs: Patient Unable To Answer (11/20/2023)  Utilities: Patient Unable To Answer (11/20/2023)  Financial Resource Strain: Low Risk  (12/02/2021)   Received from [facility]  Tobacco Use: Low Risk  (11/19/2023)    Readmission Risk Interventions     No data to display
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
## 2024-04-17 DIAGNOSIS — Z87820 Personal history of traumatic brain injury: Secondary | ICD-10-CM | POA: Diagnosis not present
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
## 2024-04-17 NOTE — Plan of Care (Signed)
  Problem: Fluid Volume: Goal: Hemodynamic stability will improve Outcome: Not Progressing   Problem: Clinical Measurements: Goal: Signs and symptoms of infection will decrease Outcome: Not Progressing   Problem: Respiratory: Goal: Ability to maintain adequate ventilation will improve Outcome: Not Progressing   Problem: Activity: Goal: Ability to tolerate increased activity will improve Outcome: Not Progressing   Problem: Respiratory: Goal: Ability to maintain a clear airway and adequate ventilation will improve Outcome: Not Progressing   Problem: Role Relationship: Goal: Method of communication will improve Outcome: Not Progressing   Problem: Clinical Measurements: Goal: Ability to maintain clinical measurements within normal limits will improve Outcome: Not Progressing Goal: Will remain free from infection Outcome: Not Progressing Goal: Diagnostic test results will improve Outcome: Not Progressing Goal: Respiratory complications will improve Outcome: Not Progressing Goal: Cardiovascular complication will be avoided Outcome: Not Progressing   Problem: Activity: Goal: Risk for activity intolerance will decrease Outcome: Not Progressing   Problem: Nutrition: Goal: Adequate nutrition will be maintained Outcome: Not Progressing   Problem: Coping: Goal: Level of anxiety will decrease Outcome: Not Progressing   Problem: Elimination: Goal: Will not experience complications related to bowel motility Outcome: Not Progressing Goal: Will not experience complications related to urinary retention Outcome: Not Progressing   Problem: Pain Managment: Goal: General experience of comfort will improve and/or be controlled Outcome: Not Progressing   Problem: Safety: Goal: Ability to remain free from injury will improve Outcome: Not Progressing   Problem: Skin Integrity: Goal: Risk for impaired skin integrity will decrease Outcome: Not Progressing   Problem:  Activity: Goal: Ability to tolerate increased activity will improve Outcome: Not Progressing   Problem: Respiratory: Goal: Ability to maintain a clear airway and adequate ventilation will improve Outcome: Not Progressing   Problem: Role Relationship: Goal: Method of communication will improve Outcome: Not Progressing

## 2024-04-18 DIAGNOSIS — Z87820 Personal history of traumatic brain injury: Secondary | ICD-10-CM | POA: Diagnosis not present

## 2024-04-18 NOTE — Plan of Care (Signed)
  Problem: Clinical Measurements: Goal: Signs and symptoms of infection will decrease Outcome: Progressing   
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
## 2024-04-19 DIAGNOSIS — Z87820 Personal history of traumatic brain injury: Secondary | ICD-10-CM | POA: Diagnosis not present
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
## 2024-04-19 NOTE — Plan of Care (Signed)
  Problem: Clinical Measurements: Goal: Ability to maintain clinical measurements within normal limits will improve Outcome: Progressing   Problem: Nutrition: Goal: Adequate nutrition will be maintained Outcome: Progressing   Problem: Pain Managment: Goal: General experience of comfort will improve and/or be controlled Outcome: Progressing   Problem: Safety: Goal: Ability to remain free from injury will improve Outcome: Progressing

## 2024-04-20 DIAGNOSIS — Z87820 Personal history of traumatic brain injury: Secondary | ICD-10-CM | POA: Diagnosis not present

## 2024-04-20 NOTE — TOC Progression Note (Signed)
 Transition of Care [facility]) - Progression Note    Patient Details  Name: Alex [name] MRN: [id number] Date of Birth: [birth year]  Transition [facility] CM/SW Contact  Lauraine JAYSON Carpen, LCSW Phone Number: 04/20/2024, 11:17 AM  Clinical Narrative: TOC continues to follow for placement.     Expected Discharge Plan: Skilled Nursing Facility Barriers to Discharge: Continued Medical Work up  Expected Discharge Plan and Services       Living arrangements for the past 2 months: Skilled Nursing Facility                                       Social Determinants of Health (SDOH) Interventions SDOH Screenings   Food Insecurity: Patient Unable To Answer (11/20/2023)  Housing: Patient Unable To Answer (11/20/2023)  Transportation Needs: Patient Unable To Answer (11/20/2023)  Utilities: Patient Unable To Answer (11/20/2023)  Financial Resource Strain: Low Risk  (12/02/2021)   [facility]  Tobacco Use: Low Risk  (11/19/2023)    Readmission Risk Interventions     No data to display
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
## 2024-04-20 NOTE — Plan of Care (Signed)
  Problem: Clinical Measurements: Goal: Ability to maintain clinical measurements within normal limits will improve Outcome: Progressing   Problem: Activity: Goal: Risk for activity intolerance will decrease Outcome: Progressing   Problem: Nutrition: Goal: Adequate nutrition will be maintained Outcome: Progressing   Problem: Safety: Goal: Ability to remain free from injury will improve Outcome: Progressing   

## 2024-04-20 NOTE — Plan of Care (Signed)
  Problem: Fluid Volume: Goal: Hemodynamic stability will improve Outcome: Progressing   Problem: Clinical Measurements: Goal: Signs and symptoms of infection will decrease Outcome: Progressing   Problem: Respiratory: Goal: Ability to maintain adequate ventilation will improve Outcome: Progressing   Problem: Activity: Goal: Ability to tolerate increased activity will improve Outcome: Progressing   Problem: Respiratory: Goal: Ability to maintain a clear airway and adequate ventilation will improve Outcome: Progressing   Problem: Role Relationship: Goal: Method of communication will improve Outcome: Progressing   Problem: Clinical Measurements: Goal: Ability to maintain clinical measurements within normal limits will improve Outcome: Progressing Goal: Will remain free from infection Outcome: Progressing Goal: Diagnostic test results will improve Outcome: Progressing Goal: Respiratory complications will improve Outcome: Progressing Goal: Cardiovascular complication will be avoided Outcome: Progressing   Problem: Activity: Goal: Risk for activity intolerance will decrease Outcome: Progressing   Problem: Nutrition: Goal: Adequate nutrition will be maintained Outcome: Progressing   Problem: Coping: Goal: Level of anxiety will decrease Outcome: Progressing   Problem: Elimination: Goal: Will not experience complications related to bowel motility Outcome: Progressing Goal: Will not experience complications related to urinary retention Outcome: Progressing   Problem: Pain Managment: Goal: General experience of comfort will improve and/or be controlled Outcome: Progressing   Problem: Safety: Goal: Ability to remain free from injury will improve Outcome: Progressing   Problem: Skin Integrity: Goal: Risk for impaired skin integrity will decrease Outcome: Progressing   Problem: Activity: Goal: Ability to tolerate increased activity will improve Outcome:  Progressing   Problem: Respiratory: Goal: Ability to maintain a clear airway and adequate ventilation will improve Outcome: Progressing   Problem: Role Relationship: Goal: Method of communication will improve Outcome: Progressing

## 2024-04-21 DIAGNOSIS — Z87820 Personal history of traumatic brain injury: Secondary | ICD-10-CM | POA: Diagnosis not present
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
## 2024-04-21 NOTE — TOC Progression Note (Signed)
 Transition of Care [facility]) - Progression Note    Patient Details  Name: MINA CARLISI MRN: [id number] Date of Birth: 1996/05/05  Transition of Care [facility]) CM/SW Contact  Elouise LULLA Capri, RN 04/21/2024, 11:17 AM  Clinical Narrative:     CM follow up call placed to [facility], Admissions Director, phone: [phone] regarding patient return to SNF. Per Massie will confirm details with Ronal and should have an update by Friday. Per Massie, requests to know if patient has restraints. CM alert to Dr. Von and RN Rankin regarding restraints. Per RN Rankin, has a mitten. CM and Rankin discussed patient needs to be off mitten restraint for 24-48 hours to qualify for SNF.   Expected Discharge Plan: Skilled Nursing Facility Barriers to Discharge: Continued Medical Work up  Expected Discharge Plan and Services    SNF LTC   Living arrangements for the past 2 months: Skilled Nursing Facility    Social Determinants of Health (SDOH) Interventions SDOH Screenings   Food Insecurity: Patient Unable To Answer (11/20/2023)  Housing: Patient Unable To Answer (11/20/2023)  Transportation Needs: Patient Unable To Answer (11/20/2023)  Utilities: Patient Unable To Answer (11/20/2023)  Financial Resource Strain: Low Risk  (12/02/2021)   Received from [facility]  Tobacco Use: Low Risk  (11/19/2023)    Readmission Risk Interventions     No data to display

## 2024-04-22 DIAGNOSIS — Z87820 Personal history of traumatic brain injury: Secondary | ICD-10-CM | POA: Diagnosis not present
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
## 2024-04-22 NOTE — Plan of Care (Signed)
  Problem: Clinical Measurements: Goal: Signs and symptoms of infection will decrease Outcome: Progressing   Problem: Respiratory: Goal: Ability to maintain adequate ventilation will improve Outcome: Progressing   Problem: Respiratory: Goal: Ability to maintain a clear airway and adequate ventilation will improve Outcome: Progressing
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
## 2024-04-23 DIAGNOSIS — Z87820 Personal history of traumatic brain injury: Secondary | ICD-10-CM | POA: Diagnosis not present
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
## 2024-04-23 NOTE — TOC Progression Note (Signed)
 Transition of Care [facility]) - Progression Note    Patient Details  Name: Alex [name] MRN: [id number] Date of Birth: [birth year]  [facility]) CM/SW Contact  Elouise LULLA Capri, RN 04/23/2024, 2:35 PM  Clinical Narrative:     Alert received from [facility] regarding patient has been 48 hours mitten free and is requesting updates regarding LTC placement. CM follow up call placed to [facility], Admissions, [facility], phone: [phone] regarding patient return to [facility]. Per [place], per [facility] leadership, SNF can start taking trach patients by 04/29/2024 at the earliest. CM alert to Dr. Von and RN Mitzie.   Expected Discharge Plan: Skilled Nursing Facility Barriers to Discharge: Continued Medical Work up  Expected Discharge Plan and Services    SNF LTC   Living arrangements for the past 2 months: Skilled Nursing Facility    Social Determinants of Health (SDOH) Interventions SDOH Screenings   Food Insecurity: Patient Unable To Answer (11/20/2023)  Housing: Patient Unable To Answer (11/20/2023)  Transportation Needs: Patient Unable To Answer (11/20/2023)  Utilities: Patient Unable To Answer (11/20/2023)  Financial Resource Strain: Low Risk  (12/02/2021)   Received from [facility]  Tobacco Use: Low Risk  (11/19/2023)    Readmission Risk Interventions     No data to display

## 2024-04-23 NOTE — Plan of Care (Signed)
   Problem: Fluid Volume: Goal: Hemodynamic stability will improve Outcome: Progressing

## 2024-04-24 DIAGNOSIS — Z87820 Personal history of traumatic brain injury: Secondary | ICD-10-CM | POA: Diagnosis not present

## 2024-04-24 NOTE — Plan of Care (Signed)
  Problem: Respiratory: Goal: Ability to maintain adequate ventilation will improve Outcome: Progressing   Problem: Nutrition: Goal: Adequate nutrition will be maintained Outcome: Progressing   

## 2024-04-24 NOTE — TOC Progression Note (Signed)
 Transition of Care [facility]) - Progression Note    Patient Details  Name: Alex [name] MRN: [id number] Date of Birth: 1996/10/17  [facility]) CM/SW Contact  Elouise LULLA Capri, RN 04/24/2024, 3:31 PM  Clinical Narrative:     Updated clinicals faxed to [facility] via Epic. CM secure message to [facility], Admissions, [facility], regarding patient sitter and restraint free.   Expected Discharge Plan: Skilled Nursing Facility Barriers to Discharge: Continued Medical Work up  Expected Discharge Plan and Services    SNF LTC   Living arrangements for the past 2 months: Skilled Nursing Facility      Social Determinants of Health (SDOH) Interventions SDOH Screenings   Food Insecurity: Patient Unable To Answer (11/20/2023)  Housing: Patient Unable To Answer (11/20/2023)  Transportation Needs: Patient Unable To Answer (11/20/2023)  Utilities: Patient Unable To Answer (11/20/2023)  Financial Resource Strain: Low Risk  (12/02/2021)   Received from [facility]  Tobacco Use: Low Risk  (11/19/2023)    Readmission Risk Interventions     No data to display
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
## 2024-04-24 NOTE — Progress Notes (Signed)
 Patient has remained sitter/restraint free this week.

## 2024-04-25 DIAGNOSIS — Z87820 Personal history of traumatic brain injury: Secondary | ICD-10-CM | POA: Diagnosis not present

## 2024-04-25 NOTE — Progress Notes (Signed)
 Patient remained restraint free and not pulling tubes.

## 2024-04-25 NOTE — Plan of Care (Signed)
  Problem: Respiratory: Goal: Ability to maintain adequate ventilation will improve Outcome: Progressing   Problem: Respiratory: Goal: Ability to maintain a clear airway and adequate ventilation will improve Outcome: Progressing   Problem: Respiratory: Goal: Ability to maintain a clear airway and adequate ventilation will improve Outcome: Progressing
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
## 2024-04-25 NOTE — Plan of Care (Signed)
  Problem: Clinical Measurements: Goal: Ability to maintain clinical measurements within normal limits will improve Outcome: Progressing   Problem: Activity: Goal: Risk for activity intolerance will decrease Outcome: Progressing   Problem: Nutrition: Goal: Adequate nutrition will be maintained Outcome: Progressing   Problem: Safety: Goal: Ability to remain free from injury will improve Outcome: Progressing   

## 2024-04-26 DIAGNOSIS — Z87820 Personal history of traumatic brain injury: Secondary | ICD-10-CM | POA: Diagnosis not present

## 2024-04-26 NOTE — Plan of Care (Signed)
  Problem: Clinical Measurements: Goal: Ability to maintain clinical measurements within normal limits will improve Outcome: Progressing   Problem: Nutrition: Goal: Adequate nutrition will be maintained Outcome: Progressing   

## 2024-04-26 NOTE — Plan of Care (Signed)
  Problem: Fluid Volume: Goal: Hemodynamic stability will improve Outcome: Progressing   Problem: Clinical Measurements: Goal: Signs and symptoms of infection will decrease Outcome: Progressing   Problem: Respiratory: Goal: Ability to maintain adequate ventilation will improve Outcome: Progressing   Problem: Respiratory: Goal: Ability to maintain a clear airway and adequate ventilation will improve Outcome: Progressing   Problem: Clinical Measurements: Goal: Ability to maintain clinical measurements within normal limits will improve Outcome: Progressing Goal: Will remain free from infection Outcome: Progressing Goal: Diagnostic test results will improve Outcome: Progressing Goal: Respiratory complications will improve Outcome: Progressing Goal: Cardiovascular complication will be avoided Outcome: Progressing   Problem: Nutrition: Goal: Adequate nutrition will be maintained Outcome: Progressing   Problem: Elimination: Goal: Will not experience complications related to bowel motility Outcome: Progressing Goal: Will not experience complications related to urinary retention Outcome: Progressing   Problem: Pain Managment: Goal: General experience of comfort will improve and/or be controlled Outcome: Progressing   Problem: Respiratory: Goal: Ability to maintain a clear airway and adequate ventilation will improve Outcome: Progressing   Problem: Activity: Goal: Ability to tolerate increased activity will improve Outcome: Not Progressing   Problem: Role Relationship: Goal: Method of communication will improve Outcome: Not Progressing   Problem: Activity: Goal: Risk for activity intolerance will decrease Outcome: Not Progressing   Problem: Coping: Goal: Level of anxiety will decrease Outcome: Not Progressing   Problem: Safety: Goal: Ability to remain free from injury will improve Outcome: Not Progressing   Problem: Skin Integrity: Goal: Risk for impaired skin  integrity will decrease Outcome: Not Progressing   Problem: Activity: Goal: Ability to tolerate increased activity will improve Outcome: Not Progressing   Problem: Role Relationship: Goal: Method of communication will improve Outcome: Not Progressing
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
## 2024-04-27 DIAGNOSIS — Z87820 Personal history of traumatic brain injury: Secondary | ICD-10-CM | POA: Diagnosis not present

## 2024-04-27 MED ORDER — DILTIAZEM HCL 25 MG/5ML IV SOLN: 5.0000 mg | Freq: Four times a day (QID) | INTRAVENOUS | Status: DC | PRN

## 2024-04-27 MED ORDER — DILTIAZEM 12 MG/ML ORAL SUSPENSION: 30.0000 mg | Freq: Four times a day (QID) | ORAL | Status: DC | PRN

## 2024-04-27 MED ORDER — DILTIAZEM HCL 30 MG PO TABS
30.0000 mg | ORAL_TABLET | Freq: Four times a day (QID) | ORAL | Status: DC | PRN
  Filled 2024-04-27: qty 1

## 2024-04-27 NOTE — Plan of Care (Signed)
   Problem: Clinical Measurements: Goal: Ability to maintain clinical measurements within normal limits will improve Outcome: Progressing   Problem: Activity: Goal: Risk for activity intolerance will decrease Outcome: Progressing   Problem: Nutrition: Goal: Adequate nutrition will be maintained Outcome: Progressing

## 2024-04-27 NOTE — Plan of Care (Signed)
  Problem: Fluid Volume: Goal: Hemodynamic stability will improve Outcome: Progressing   Problem: Clinical Measurements: Goal: Signs and symptoms of infection will decrease Outcome: Progressing   Problem: Respiratory: Goal: Ability to maintain adequate ventilation will improve Outcome: Progressing   Problem: Respiratory: Goal: Ability to maintain a clear airway and adequate ventilation will improve Outcome: Progressing   Problem: Clinical Measurements: Goal: Ability to maintain clinical measurements within normal limits will improve Outcome: Progressing Goal: Will remain free from infection Outcome: Progressing Goal: Diagnostic test results will improve Outcome: Progressing Goal: Respiratory complications will improve Outcome: Progressing Goal: Cardiovascular complication will be avoided Outcome: Progressing   Problem: Nutrition: Goal: Adequate nutrition will be maintained Outcome: Progressing   Problem: Elimination: Goal: Will not experience complications related to bowel motility Outcome: Progressing Goal: Will not experience complications related to urinary retention Outcome: Progressing   Problem: Pain Managment: Goal: General experience of comfort will improve and/or be controlled Outcome: Progressing   Problem: Respiratory: Goal: Ability to maintain a clear airway and adequate ventilation will improve Outcome: Progressing   Problem: Activity: Goal: Ability to tolerate increased activity will improve Outcome: Not Progressing   Problem: Role Relationship: Goal: Method of communication will improve Outcome: Not Progressing   Problem: Activity: Goal: Risk for activity intolerance will decrease Outcome: Not Progressing   Problem: Coping: Goal: Level of anxiety will decrease Outcome: Not Progressing   Problem: Safety: Goal: Ability to remain free from injury will improve Outcome: Not Progressing   Problem: Skin Integrity: Goal: Risk for impaired skin  integrity will decrease Outcome: Not Progressing   Problem: Activity: Goal: Ability to tolerate increased activity will improve Outcome: Not Progressing   Problem: Role Relationship: Goal: Method of communication will improve Outcome: Not Progressing
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
## 2024-04-28 DIAGNOSIS — Z87820 Personal history of traumatic brain injury: Secondary | ICD-10-CM | POA: Diagnosis not present

## 2024-04-28 NOTE — TOC Progression Note (Signed)
 Transition of Care [facility]) - Progression Note    Patient Details  Name: Alex [name] MRN: [id number] Date of Birth: [birth year]  [facility] CM/SW Contact  Marinda Cooks, RN Phone Number: 04/28/2024, 3:01 PM  Clinical Narrative:    This CM spoke with SNF liaison Massie at [facility]  regarding pt's  pending bed offer and was updated pt's current level of care can not be accommodated as dicussed at [facility], informed beds are being  reviewed for other facilities within the [facility] umbrella  with the Regional manager that can support pt's current level of care especially surrounding pt's trach care.  TOC will cont to follow dc planning / care coordination and update as applicable.    Expected Discharge Plan: Skilled Nursing Facility Barriers to Discharge: Continued Medical Work up  Expected Discharge Plan and Services       Living arrangements for the past 2 months: Skilled Nursing Facility                                       Social Determinants of Health (SDOH) Interventions SDOH Screenings   Food Insecurity: Patient Unable To Answer (11/20/2023)  Housing: Patient Unable To Answer (11/20/2023)  Transportation Needs: Patient Unable To Answer (11/20/2023)  Utilities: Patient Unable To Answer (11/20/2023)  Financial Resource Strain: Low Risk  (12/02/2021)   Received from [facility]  Tobacco Use: Low Risk  (11/19/2023)    Readmission Risk Interventions     No data to display

## 2024-04-28 NOTE — Plan of Care (Signed)
  Problem: Fluid Volume: Goal: Hemodynamic stability will improve Outcome: Progressing   Problem: Clinical Measurements: Goal: Signs and symptoms of infection will decrease Outcome: Progressing   Problem: Respiratory: Goal: Ability to maintain adequate ventilation will improve Outcome: Progressing   Problem: Respiratory: Goal: Ability to maintain a clear airway and adequate ventilation will improve Outcome: Progressing   Problem: Role Relationship: Goal: Method of communication will improve Outcome: Progressing   Problem: Clinical Measurements: Goal: Ability to maintain clinical measurements within normal limits will improve Outcome: Progressing Goal: Will remain free from infection Outcome: Progressing Goal: Diagnostic test results will improve Outcome: Progressing Goal: Respiratory complications will improve Outcome: Progressing Goal: Cardiovascular complication will be avoided Outcome: Progressing   Problem: Nutrition: Goal: Adequate nutrition will be maintained Outcome: Progressing   Problem: Coping: Goal: Level of anxiety will decrease Outcome: Progressing   Problem: Elimination: Goal: Will not experience complications related to bowel motility Outcome: Progressing Goal: Will not experience complications related to urinary retention Outcome: Progressing   Problem: Pain Managment: Goal: General experience of comfort will improve and/or be controlled Outcome: Progressing   Problem: Safety: Goal: Ability to remain free from injury will improve Outcome: Progressing   Problem: Skin Integrity: Goal: Risk for impaired skin integrity will decrease Outcome: Progressing   Problem: Respiratory: Goal: Ability to maintain a clear airway and adequate ventilation will improve Outcome: Progressing   Problem: Role Relationship: Goal: Method of communication will improve Outcome: Progressing   Problem: Activity: Goal: Ability to tolerate increased activity will  improve Outcome: Not Progressing   Problem: Activity: Goal: Risk for activity intolerance will decrease Outcome: Not Progressing   Problem: Activity: Goal: Ability to tolerate increased activity will improve Outcome: Not Progressing

## 2024-04-28 NOTE — Plan of Care (Signed)
  Problem: Clinical Measurements: Goal: Will remain free from infection Outcome: Progressing   Problem: Nutrition: Goal: Adequate nutrition will be maintained Outcome: Progressing   Problem: Elimination: Goal: Will not experience complications related to bowel motility Outcome: Progressing   Problem: Pain Managment: Goal: General experience of comfort will improve and/or be controlled Outcome: Progressing   Problem: Role Relationship: Goal: Method of communication will improve Outcome: Not Progressing
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
## 2024-04-29 DIAGNOSIS — Z87820 Personal history of traumatic brain injury: Secondary | ICD-10-CM | POA: Diagnosis not present
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
## 2024-04-29 NOTE — Plan of Care (Signed)
  Problem: Fluid Volume: Goal: Hemodynamic stability will improve Outcome: Progressing   Problem: Clinical Measurements: Goal: Signs and symptoms of infection will decrease Outcome: Progressing   Problem: Respiratory: Goal: Ability to maintain adequate ventilation will improve Outcome: Progressing   Problem: Activity: Goal: Ability to tolerate increased activity will improve Outcome: Progressing   Problem: Respiratory: Goal: Ability to maintain a clear airway and adequate ventilation will improve Outcome: Progressing   Problem: Role Relationship: Goal: Method of communication will improve Outcome: Progressing   Problem: Clinical Measurements: Goal: Ability to maintain clinical measurements within normal limits will improve Outcome: Progressing Goal: Will remain free from infection Outcome: Progressing Goal: Diagnostic test results will improve Outcome: Progressing Goal: Respiratory complications will improve Outcome: Progressing Goal: Cardiovascular complication will be avoided Outcome: Progressing   Problem: Activity: Goal: Risk for activity intolerance will decrease Outcome: Progressing   Problem: Nutrition: Goal: Adequate nutrition will be maintained Outcome: Progressing   Problem: Coping: Goal: Level of anxiety will decrease Outcome: Progressing   Problem: Elimination: Goal: Will not experience complications related to bowel motility Outcome: Progressing Goal: Will not experience complications related to urinary retention Outcome: Progressing   Problem: Pain Managment: Goal: General experience of comfort will improve and/or be controlled Outcome: Progressing   Problem: Safety: Goal: Ability to remain free from injury will improve Outcome: Progressing   Problem: Skin Integrity: Goal: Risk for impaired skin integrity will decrease Outcome: Progressing   Problem: Activity: Goal: Ability to tolerate increased activity will improve Outcome:  Progressing   Problem: Respiratory: Goal: Ability to maintain a clear airway and adequate ventilation will improve Outcome: Progressing   Problem: Role Relationship: Goal: Method of communication will improve Outcome: Progressing

## 2024-04-29 NOTE — TOC Progression Note (Signed)
 Transition of Care [facility]) - Progression Note    Patient Details  Name: Alex [name] MRN: [id number] Date of Birth: [birth year]  Transition of Care [facility] CM/SW Contact  [place] L Reine [place], [place] Phone Number: 04/29/2024, 12:49 PM  Clinical Narrative:     CSW spoke with [facility], regarding bed placement. CSW advised that there are continued efforts to locate a trach bed.   No further updates.   Expected Discharge Plan: Skilled Nursing Facility Barriers to Discharge: Continued Medical Work up  Expected Discharge Plan and Services       Living arrangements for the past 2 months: Skilled Nursing Facility                                       Social Determinants of Health (SDOH) Interventions SDOH Screenings   Food Insecurity: Patient Unable To Answer (11/20/2023)  Housing: Patient Unable To Answer (11/20/2023)  Transportation Needs: Patient Unable To Answer (11/20/2023)  Utilities: Patient Unable To Answer (11/20/2023)  Financial Resource Strain: Low Risk  (12/02/2021)   Received from [facility]  Tobacco Use: Low Risk  (11/19/2023)    Readmission Risk Interventions     No data to display

## 2024-04-30 DIAGNOSIS — Z87820 Personal history of traumatic brain injury: Secondary | ICD-10-CM | POA: Diagnosis not present

## 2024-04-30 NOTE — Plan of Care (Signed)
  Problem: Respiratory: Goal: Ability to maintain a clear airway and adequate ventilation will improve Outcome: Progressing   
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
## 2024-05-01 DIAGNOSIS — Z87820 Personal history of traumatic brain injury: Secondary | ICD-10-CM | POA: Diagnosis not present

## 2024-05-01 MED ORDER — LORAZEPAM 2 MG/ML IJ SOLN: 2.0000 mg | Freq: Four times a day (QID) | INTRAMUSCULAR | Status: DC | PRN

## 2024-05-01 MED ORDER — LORAZEPAM 2 MG/ML PO CONC
2.0000 mg | Freq: Four times a day (QID) | ORAL | Status: DC | PRN
  Administered 2024-05-01: 2 mg
  Filled 2024-05-01: qty 1

## 2024-05-01 NOTE — Plan of Care (Signed)
  Problem: Fluid Volume: Goal: Hemodynamic stability will improve Outcome: Progressing   Problem: Clinical Measurements: Goal: Signs and symptoms of infection will decrease Outcome: Progressing   Problem: Respiratory: Goal: Ability to maintain adequate ventilation will improve Outcome: Progressing   

## 2024-05-01 NOTE — Plan of Care (Signed)
  Problem: Fluid Volume: Goal: Hemodynamic stability will improve Outcome: Progressing   Problem: Clinical Measurements: Goal: Signs and symptoms of infection will decrease Outcome: Progressing   Problem: Respiratory: Goal: Ability to maintain adequate ventilation will improve Outcome: Progressing   Problem: Activity: Goal: Ability to tolerate increased activity will improve Outcome: Progressing   Problem: Respiratory: Goal: Ability to maintain a clear airway and adequate ventilation will improve Outcome: Progressing   Problem: Role Relationship: Goal: Method of communication will improve Outcome: Progressing   Problem: Clinical Measurements: Goal: Ability to maintain clinical measurements within normal limits will improve Outcome: Progressing Goal: Will remain free from infection Outcome: Progressing Goal: Diagnostic test results will improve Outcome: Progressing Goal: Respiratory complications will improve Outcome: Progressing Goal: Cardiovascular complication will be avoided Outcome: Progressing   Problem: Activity: Goal: Risk for activity intolerance will decrease Outcome: Progressing   Problem: Nutrition: Goal: Adequate nutrition will be maintained Outcome: Progressing   Problem: Coping: Goal: Level of anxiety will decrease Outcome: Progressing   Problem: Elimination: Goal: Will not experience complications related to bowel motility Outcome: Progressing Goal: Will not experience complications related to urinary retention Outcome: Progressing   Problem: Pain Managment: Goal: General experience of comfort will improve and/or be controlled Outcome: Progressing   Problem: Safety: Goal: Ability to remain free from injury will improve Outcome: Progressing   Problem: Skin Integrity: Goal: Risk for impaired skin integrity will decrease Outcome: Progressing   Problem: Activity: Goal: Ability to tolerate increased activity will improve Outcome:  Progressing   Problem: Respiratory: Goal: Ability to maintain a clear airway and adequate ventilation will improve Outcome: Progressing   Problem: Role Relationship: Goal: Method of communication will improve Outcome: Progressing
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
## 2024-05-02 DIAGNOSIS — Z87820 Personal history of traumatic brain injury: Secondary | ICD-10-CM | POA: Diagnosis not present
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
## 2024-05-02 NOTE — Plan of Care (Signed)
  Problem: Clinical Measurements: Goal: Signs and symptoms of infection will decrease Outcome: Progressing   Problem: Respiratory: Goal: Ability to maintain adequate ventilation will improve Outcome: Progressing   

## 2024-05-02 NOTE — Plan of Care (Signed)
  Problem: Clinical Measurements: Goal: Ability to maintain clinical measurements within normal limits will improve Outcome: Progressing   Problem: Nutrition: Goal: Adequate nutrition will be maintained Outcome: Progressing   

## 2024-05-03 DIAGNOSIS — Z87820 Personal history of traumatic brain injury: Secondary | ICD-10-CM | POA: Diagnosis not present

## 2024-05-03 NOTE — Plan of Care (Signed)

## 2024-05-03 NOTE — Plan of Care (Signed)
  Problem: Fluid Volume: Goal: Hemodynamic stability will improve Outcome: Progressing   Problem: Clinical Measurements: Goal: Signs and symptoms of infection will decrease Outcome: Progressing   Problem: Respiratory: Goal: Ability to maintain adequate ventilation will improve Outcome: Progressing   Problem: Respiratory: Goal: Ability to maintain a clear airway and adequate ventilation will improve Outcome: Progressing   Problem: Role Relationship: Goal: Method of communication will improve Outcome: Progressing   Problem: Clinical Measurements: Goal: Ability to maintain clinical measurements within normal limits will improve Outcome: Progressing Goal: Will remain free from infection Outcome: Progressing Goal: Diagnostic test results will improve Outcome: Progressing Goal: Respiratory complications will improve Outcome: Progressing Goal: Cardiovascular complication will be avoided Outcome: Progressing   Problem: Nutrition: Goal: Adequate nutrition will be maintained Outcome: Progressing   Problem: Elimination: Goal: Will not experience complications related to bowel motility Outcome: Progressing Goal: Will not experience complications related to urinary retention Outcome: Progressing   Problem: Pain Managment: Goal: General experience of comfort will improve and/or be controlled Outcome: Progressing   Problem: Respiratory: Goal: Ability to maintain a clear airway and adequate ventilation will improve Outcome: Progressing   Problem: Role Relationship: Goal: Method of communication will improve Outcome: Progressing   Problem: Activity: Goal: Ability to tolerate increased activity will improve Outcome: Not Progressing   Problem: Activity: Goal: Risk for activity intolerance will decrease Outcome: Not Progressing   Problem: Coping: Goal: Level of anxiety will decrease Outcome: Not Progressing   Problem: Safety: Goal: Ability to remain free from injury  will improve Outcome: Not Progressing   Problem: Skin Integrity: Goal: Risk for impaired skin integrity will decrease Outcome: Not Progressing   Problem: Activity: Goal: Ability to tolerate increased activity will improve Outcome: Not Progressing
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
## 2024-05-04 DIAGNOSIS — Z87820 Personal history of traumatic brain injury: Secondary | ICD-10-CM | POA: Diagnosis not present

## 2024-05-04 NOTE — TOC Progression Note (Signed)
 Transition of Care [facility]) - Progression Note    Patient Details  Name: Alex [name] MRN: [id number] Date of Birth: [birth year]  Transition of [facility]) CM/SW Contact  Marinda Cooks, RN Phone Number: 05/04/2024, 10:40 AM  Clinical Narrative:    This CM rec'd call from Admission Liaison at [facility] and updated per his Regional Manager a bed offer is being extended tentatively at their [facility]  in [place] this upcoming wk to support his level of care including trach needs. TOC will cont to follow dc planning / care coordination and update as applicable.   Expected Discharge Plan: Skilled Nursing Facility Barriers to Discharge: Continued Medical Work up  Expected Discharge Plan and Services       Living arrangements for the past 2 months: Skilled Nursing Facility                                       Social Determinants of Health (SDOH) Interventions SDOH Screenings   Food Insecurity: Patient Unable To Answer (11/20/2023)  Housing: Patient Unable To Answer (11/20/2023)  Transportation Needs: Patient Unable To Answer (11/20/2023)  Utilities: Patient Unable To Answer (11/20/2023)  Financial Resource Strain: Low Risk  (12/02/2021)   Received from [facility]  Tobacco Use: Low Risk  (11/19/2023)    Readmission Risk Interventions     No data to display

## 2024-05-04 NOTE — Plan of Care (Signed)
  Problem: Fluid Volume: Goal: Hemodynamic stability will improve Outcome: Progressing   Problem: Clinical Measurements: Goal: Signs and symptoms of infection will decrease Outcome: Progressing   Problem: Respiratory: Goal: Ability to maintain adequate ventilation will improve Outcome: Progressing   Problem: Activity: Goal: Ability to tolerate increased activity will improve Outcome: Progressing   Problem: Respiratory: Goal: Ability to maintain a clear airway and adequate ventilation will improve Outcome: Progressing   Problem: Role Relationship: Goal: Method of communication will improve Outcome: Progressing   Problem: Clinical Measurements: Goal: Ability to maintain clinical measurements within normal limits will improve Outcome: Progressing Goal: Will remain free from infection Outcome: Progressing Goal: Diagnostic test results will improve Outcome: Progressing Goal: Respiratory complications will improve Outcome: Progressing Goal: Cardiovascular complication will be avoided Outcome: Progressing   Problem: Activity: Goal: Risk for activity intolerance will decrease Outcome: Progressing   Problem: Nutrition: Goal: Adequate nutrition will be maintained Outcome: Progressing   Problem: Coping: Goal: Level of anxiety will decrease Outcome: Progressing   Problem: Elimination: Goal: Will not experience complications related to bowel motility Outcome: Progressing Goal: Will not experience complications related to urinary retention Outcome: Progressing   Problem: Pain Managment: Goal: General experience of comfort will improve and/or be controlled Outcome: Progressing   Problem: Safety: Goal: Ability to remain free from injury will improve Outcome: Progressing   Problem: Skin Integrity: Goal: Risk for impaired skin integrity will decrease Outcome: Progressing   Problem: Activity: Goal: Ability to tolerate increased activity will improve Outcome:  Progressing   Problem: Respiratory: Goal: Ability to maintain a clear airway and adequate ventilation will improve Outcome: Progressing   Problem: Role Relationship: Goal: Method of communication will improve Outcome: Progressing
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
## 2024-05-05 DIAGNOSIS — Z87820 Personal history of traumatic brain injury: Secondary | ICD-10-CM | POA: Diagnosis not present

## 2024-05-05 NOTE — Plan of Care (Signed)
  Problem: Respiratory: Goal: Ability to maintain a clear airway and adequate ventilation will improve Outcome: Progressing   

## 2024-05-05 NOTE — Plan of Care (Signed)
  Problem: Fluid Volume: Goal: Hemodynamic stability will improve Outcome: Progressing   Problem: Clinical Measurements: Goal: Signs and symptoms of infection will decrease Outcome: Progressing   Problem: Respiratory: Goal: Ability to maintain adequate ventilation will improve Outcome: Progressing   Problem: Activity: Goal: Ability to tolerate increased activity will improve Outcome: Progressing   Problem: Respiratory: Goal: Ability to maintain a clear airway and adequate ventilation will improve Outcome: Progressing   Problem: Clinical Measurements: Goal: Ability to maintain clinical measurements within normal limits will improve Outcome: Progressing   Problem: Respiratory: Goal: Ability to maintain a clear airway and adequate ventilation will improve Outcome: Progressing

## 2024-05-05 NOTE — TOC Progression Note (Addendum)
 Transition of Care [facility]) - Progression Note    Patient Details  Name: Alex [name] MRN: [id number] Date of Birth: 1995/11/23  Transition of Care [facility]) CM/SW Contact  Alex LULLA Capri, Alex [name] 05/05/2024, 3:40 PM  Clinical Narrative:     CM follow up call placed to [facility], Admissions, [facility], phone: [phone] regarding SNF placement. Per Massie, [employer] has approved for patient to attend to [facility] and [place], [place], [place]. CM and Massie discussed patient has [place] Medicaid. Per Massie, Regional/Corporate Office has accepted financial responsibility for patient to attend [facility]. Per Massie, will email CM confirmation. Per Massie, CM to call [place], Admissions, [facility],phone:  [phone]. CM call to [place],Admissions, no answer. CM left message for return call. CM call to patient's mother, Alex [name], phone: [phone] regarding accepting SNF. No answer, CM left message for return call.   Expected Discharge Plan: Skilled Nursing Facility Barriers to Discharge: Continued Medical Work up  Expected Discharge Plan and Services    SNF   Living arrangements for the past 2 months: Skilled Nursing Facility    Social Determinants of Health (SDOH) Interventions SDOH Screenings   Food Insecurity: Patient Unable To Answer (11/20/2023)  Housing: Patient Unable To Answer (11/20/2023)  Transportation Needs: Patient Unable To Answer (11/20/2023)  Utilities: Patient Unable To Answer (11/20/2023)  Financial Resource Strain: Low Risk  (12/02/2021)   Received from [facility]  Tobacco Use: Low Risk  (11/19/2023)    Readmission Risk Interventions     No data to display
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
## 2024-05-06 DIAGNOSIS — Z87820 Personal history of traumatic brain injury: Secondary | ICD-10-CM | POA: Diagnosis not present
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
## 2024-05-06 NOTE — TOC Progression Note (Signed)
 Transition of Care [facility]) - Progression Note    Patient Details  Name: CAEDIN MOGAN MRN: [id number] Date of Birth: 1996/10/22  Transition of Care [facility]) CM/SW Contact  Elouise LULLA Capri, RN 05/06/2024, 4:25 PM  Clinical Narrative:     CM returned call to patient's mother, Suzen, phone: [phone]. CM and patient's mother, discussed accepting SNF, [facility] and [place] [place], [place]. Per patient's mother, verbalized understanding and agreement. Per patient's mother expressed desire for closer SNF when available.  CM follow up call placed to [facility], Admissions Dir, [facility], phone [phone] regarding awaiting SNF response from [place], Admissions, [facility], [place]. Per [place]ie will follow up with CM on tomorrow, 05/07/2024.  Expected Discharge Plan: Skilled Nursing Facility Barriers to Discharge: Continued Medical Work up  Expected Discharge Plan and Services    SNF LTC   Living arrangements for the past 2 months: Skilled Nursing Facility   Social Determinants of Health (SDOH) Interventions SDOH Screenings   Food Insecurity: Patient Unable To Answer (11/20/2023)  Housing: Patient Unable To Answer (11/20/2023)  Transportation Needs: Patient Unable To Answer (11/20/2023)  Utilities: Patient Unable To Answer (11/20/2023)  Financial Resource Strain: Low Risk  (12/02/2021)   Received [facility]  Tobacco Use: Low Risk  (11/19/2023)    Readmission Risk Interventions     No data to display

## 2024-05-06 NOTE — Plan of Care (Signed)
   Problem: Respiratory: Goal: Ability to maintain adequate ventilation will improve Outcome: Progressing

## 2024-05-07 DIAGNOSIS — Z87820 Personal history of traumatic brain injury: Secondary | ICD-10-CM | POA: Diagnosis not present

## 2024-05-07 NOTE — Plan of Care (Signed)
  Problem: Respiratory: Goal: Ability to maintain adequate ventilation will improve Outcome: Progressing   Problem: Respiratory: Goal: Ability to maintain a clear airway and adequate ventilation will improve Outcome: Progressing
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
## 2024-05-07 NOTE — Plan of Care (Signed)
  Problem: Nutrition: Goal: Adequate nutrition will be maintained Outcome: Progressing   Problem: Coping: Goal: Level of anxiety will decrease Outcome: Progressing   Problem: Elimination: Goal: Will not experience complications related to bowel motility Outcome: Progressing   Problem: Safety: Goal: Ability to remain free from injury will improve Outcome: Progressing   

## 2024-05-08 DIAGNOSIS — Z87820 Personal history of traumatic brain injury: Secondary | ICD-10-CM | POA: Diagnosis not present
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
## 2024-05-08 NOTE — Plan of Care (Signed)
  Problem: Respiratory: Goal: Ability to maintain adequate ventilation will improve Outcome: Progressing   Problem: Respiratory: Goal: Ability to maintain a clear airway and adequate ventilation will improve Outcome: Progressing   Problem: Clinical Measurements: Goal: Diagnostic test results will improve Outcome: Progressing   Problem: Nutrition: Goal: Adequate nutrition will be maintained Outcome: Progressing

## 2024-05-09 DIAGNOSIS — Z87820 Personal history of traumatic brain injury: Secondary | ICD-10-CM | POA: Diagnosis not present

## 2024-05-09 NOTE — Plan of Care (Signed)
  Problem: Clinical Measurements: Goal: Will remain free from infection Outcome: Progressing   Problem: Nutrition: Goal: Adequate nutrition will be maintained Outcome: Progressing   
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
## 2024-05-09 NOTE — Plan of Care (Signed)
  Problem: Respiratory: Goal: Ability to maintain a clear airway and adequate ventilation will improve Outcome: Progressing   Problem: Role Relationship: Goal: Method of communication will improve Outcome: Progressing   Problem: Respiratory: Goal: Ability to maintain a clear airway and adequate ventilation will improve Outcome: Progressing   Problem: Clinical Measurements: Goal: Ability to maintain clinical measurements within normal limits will improve Outcome: Progressing Goal: Will remain free from infection Outcome: Progressing Goal: Diagnostic test results will improve Outcome: Progressing Goal: Respiratory complications will improve Outcome: Progressing Goal: Cardiovascular complication will be avoided Outcome: Progressing   Problem: Nutrition: Goal: Adequate nutrition will be maintained Outcome: Progressing
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
## 2024-05-09 NOTE — TOC Progression Note (Signed)
 Transition of Care [facility]) - Progression Note    Patient Details  Name: Alex [name] MRN: [id number] Date of Birth: 1995/10/26  [facility] of [facility] CM/SW Contact  Elouise LULLA Capri, RN 05/09/2024, 10:46 AM  Clinical Narrative:     CM call to [place], Admissions Dir, [facility], phone: [phone] regarding admission status update on patient. No answer. CM left message for return call. CM call to [place], Admissions, [facility], [place], [place], phone: [phone] regarding possible admission. CM spoke to [place]. Per [place] will need to discuss case with Regional/Corporate Office and call CM back regarding Regional/Corporate office accepting financial responsibility for patient in [place] as patient has [place] Medicaid.   Expected Discharge Plan: Skilled Nursing Facility Barriers to Discharge: Continued Medical Work up  Expected Discharge Plan and Services    SNF LTC   Living arrangements for the past 2 months: Skilled Nursing Facility                  Social Determinants of Health (SDOH) Interventions SDOH Screenings   Food Insecurity: Patient Unable To Answer (11/20/2023)  Housing: Patient Unable To Answer (11/20/2023)  Transportation Needs: Patient Unable To Answer (11/20/2023)  Utilities: Patient Unable To Answer (11/20/2023)  Financial Resource Strain: Low Risk  (12/02/2021)   Received from [facility] [place] [facility]  Tobacco Use: Low Risk  (11/19/2023)    Readmission Risk Interventions     No data to display

## 2024-05-10 DIAGNOSIS — Z87820 Personal history of traumatic brain injury: Secondary | ICD-10-CM | POA: Diagnosis not present

## 2024-05-10 NOTE — Plan of Care (Signed)
  Problem: Fluid Volume: Goal: Hemodynamic stability will improve Outcome: Progressing   Problem: Clinical Measurements: Goal: Signs and symptoms of infection will decrease Outcome: Progressing   Problem: Respiratory: Goal: Ability to maintain adequate ventilation will improve Outcome: Progressing   Problem: Respiratory: Goal: Ability to maintain a clear airway and adequate ventilation will improve Outcome: Progressing   Problem: Role Relationship: Goal: Method of communication will improve Outcome: Progressing   Problem: Clinical Measurements: Goal: Ability to maintain clinical measurements within normal limits will improve Outcome: Progressing Goal: Will remain free from infection Outcome: Progressing Goal: Diagnostic test results will improve Outcome: Progressing Goal: Respiratory complications will improve Outcome: Progressing Goal: Cardiovascular complication will be avoided Outcome: Progressing   Problem: Nutrition: Goal: Adequate nutrition will be maintained Outcome: Progressing   Problem: Elimination: Goal: Will not experience complications related to bowel motility Outcome: Progressing Goal: Will not experience complications related to urinary retention Outcome: Progressing   Problem: Pain Managment: Goal: General experience of comfort will improve and/or be controlled Outcome: Progressing   Problem: Respiratory: Goal: Ability to maintain a clear airway and adequate ventilation will improve Outcome: Progressing   Problem: Activity: Goal: Ability to tolerate increased activity will improve Outcome: Not Progressing   Problem: Activity: Goal: Risk for activity intolerance will decrease Outcome: Not Progressing   Problem: Coping: Goal: Level of anxiety will decrease Outcome: Not Progressing   Problem: Safety: Goal: Ability to remain free from injury will improve Outcome: Not Progressing   Problem: Skin Integrity: Goal: Risk for impaired skin  integrity will decrease Outcome: Not Progressing   Problem: Activity: Goal: Ability to tolerate increased activity will improve Outcome: Not Progressing   Problem: Role Relationship: Goal: Method of communication will improve Outcome: Not Progressing

## 2024-05-10 NOTE — Plan of Care (Signed)
 ?  Problem: Clinical Measurements: ?Goal: Will remain free from infection ?Outcome: Progressing ?  ?
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
## 2024-05-11 DIAGNOSIS — Z87820 Personal history of traumatic brain injury: Secondary | ICD-10-CM | POA: Diagnosis not present

## 2024-05-11 NOTE — Plan of Care (Signed)
  Problem: Activity: Goal: Ability to tolerate increased activity will improve Outcome: Progressing   Problem: Nutrition: Goal: Adequate nutrition will be maintained Outcome: Progressing   Problem: Elimination: Goal: Will not experience complications related to bowel motility Outcome: Progressing

## 2024-05-11 NOTE — Plan of Care (Signed)
  Problem: Fluid Volume: Goal: Hemodynamic stability will improve Outcome: Progressing   Problem: Clinical Measurements: Goal: Signs and symptoms of infection will decrease Outcome: Progressing   Problem: Respiratory: Goal: Ability to maintain adequate ventilation will improve Outcome: Progressing   Problem: Activity: Goal: Ability to tolerate increased activity will improve Outcome: Progressing   Problem: Respiratory: Goal: Ability to maintain a clear airway and adequate ventilation will improve Outcome: Progressing   Problem: Role Relationship: Goal: Method of communication will improve Outcome: Progressing   Problem: Clinical Measurements: Goal: Ability to maintain clinical measurements within normal limits will improve Outcome: Progressing Goal: Will remain free from infection Outcome: Progressing Goal: Diagnostic test results will improve Outcome: Progressing Goal: Respiratory complications will improve Outcome: Progressing Goal: Cardiovascular complication will be avoided Outcome: Progressing   Problem: Activity: Goal: Risk for activity intolerance will decrease Outcome: Progressing   Problem: Nutrition: Goal: Adequate nutrition will be maintained Outcome: Progressing   Problem: Coping: Goal: Level of anxiety will decrease Outcome: Progressing   Problem: Elimination: Goal: Will not experience complications related to bowel motility Outcome: Progressing Goal: Will not experience complications related to urinary retention Outcome: Progressing   Problem: Pain Managment: Goal: General experience of comfort will improve and/or be controlled Outcome: Progressing   Problem: Safety: Goal: Ability to remain free from injury will improve Outcome: Progressing   Problem: Skin Integrity: Goal: Risk for impaired skin integrity will decrease Outcome: Progressing   Problem: Activity: Goal: Ability to tolerate increased activity will improve Outcome:  Progressing   Problem: Respiratory: Goal: Ability to maintain a clear airway and adequate ventilation will improve Outcome: Progressing   Problem: Role Relationship: Goal: Method of communication will improve Outcome: Progressing

## 2024-05-11 NOTE — Progress Notes (Signed)
 Trach changed to #6 Shiley Flex uncuffed. No complications noted, equal breath sounds, no respiratory distress. Respiratory rate 20/min, sats 98% on roomair.
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
## 2024-05-12 DIAGNOSIS — Z87820 Personal history of traumatic brain injury: Secondary | ICD-10-CM | POA: Diagnosis not present

## 2024-05-12 NOTE — Plan of Care (Signed)
  Problem: Fluid Volume: Goal: Hemodynamic stability will improve Outcome: Progressing   Problem: Respiratory: Goal: Ability to maintain adequate ventilation will improve Outcome: Progressing   Problem: Nutrition: Goal: Adequate nutrition will be maintained Outcome: Progressing   Problem: Elimination: Goal: Will not experience complications related to bowel motility Outcome: Progressing
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
## 2024-05-13 DIAGNOSIS — Z87820 Personal history of traumatic brain injury: Secondary | ICD-10-CM | POA: Diagnosis not present
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
## 2024-05-13 NOTE — Plan of Care (Signed)
   Problem: Fluid Volume: Goal: Hemodynamic stability will improve Outcome: Progressing

## 2024-05-14 DIAGNOSIS — Z930 Tracheostomy status: Secondary | ICD-10-CM

## 2024-05-14 DIAGNOSIS — J9601 Acute respiratory failure with hypoxia: Secondary | ICD-10-CM | POA: Diagnosis not present

## 2024-05-14 DIAGNOSIS — Z87820 Personal history of traumatic brain injury: Secondary | ICD-10-CM | POA: Diagnosis not present

## 2024-05-14 MED ORDER — FREE WATER
100.0000 mL | Status: DC
  Administered 2024-05-14 – 2024-05-23 (×51): 100 mL

## 2024-05-14 MED ORDER — JEVITY 1.5 CAL/FIBER PO LIQD
1000.0000 mL | ORAL | Status: DC
  Administered 2024-05-14 – 2024-05-18 (×3): 1000 mL
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
## 2024-05-14 NOTE — Consult Note (Signed)
 NAME:  Alex [name], MRN:  [id number], DOB:  1996/05/18, LOS: 177 ADMISSION DATE:  11/19/2023, CONSULTATION DATE:  05/14/2024 REFERRING MD:  Anthony Pouch, MD, CHIEF COMPLAINT:  Tracheostomy Decannulation   History of Present Illness:   Alex [name] is a 28 year old male with a history of TBI and epilepsy who presented to the hospital on 11/19/2023 with altered mental status. He developed respiratory failure secondary to aspiration, requiring intubation.  Patient was admitted 1/28 to the ICU for respiratory failure requiring intubation and mechanical ventilation. He was bronched on 11/22/2023 with therapeutic aspiration of secretions, with cultures returning positive for pseudomonas. He was extubated on 11/27/2023, but unfortunately had recurrence of symptoms with respiratory failure on 2/8 secondary to aspiration and mucus plugging, requiring intubation and mechanical ventilation.   Patient underwent tracheostomy tube placement with ENT on 12/07/2023. He progressed through his care and was transferred to the floor. His secretion burden has improved, and he has been on room air, with no secretions noted. Patient placement has been difficult, and he remains in the hospital pending disposition.  Pertinent  Medical History  -TBI -Seizure Disorder  Significant Hospital Events: Including procedures, antibiotic start and stop dates in addition to other pertinent events   1/28: admit, intubated 2/4: extubated 2/8: reintubated 2/14: tracheostomy tube placed  Interim History / Subjective:  Patient agitated and non-verbal. Unable to obtain history. He is biting and attempting to punch.  Objective    Blood pressure (!) 110/58, pulse 74, temperature 98.4 F (36.9 C), resp. rate 18, height 5' 9.02 (1.753 m), weight 68.4 kg, SpO2 95%.        Intake/Output Summary (Last 24 hours) at 05/14/2024 1101 Last data filed at 05/14/2024 0900 Gross per 24 hour  Intake 2818 ml  Output 275 ml  Net 2543 ml    Filed Weights   05/11/24 0658 05/12/24 0519 05/13/24 0329  Weight: 69.2 kg 69.9 kg 68.4 kg    Examination: Physical Exam Constitutional:      General: He is not in acute distress.    Appearance: He is not ill-appearing.  Neck:     Comments: 7.5 cuffless Shiley tracheostomy tube in place Cardiovascular:     Rate and Rhythm: Normal rate and regular rhythm.     Pulses: Normal pulses.     Heart sounds: Normal heart sounds.  Pulmonary:     Effort: Pulmonary effort is normal.     Breath sounds: Normal breath sounds.  Abdominal:     Palpations: Abdomen is soft.  Neurological:     Mental Status: He is alert. Mental status is at baseline. He is disoriented.     Assessment and Plan   #Chronic Hypoxic Respiratory Failure #Tracheostomy tube dependence  28 year old male with history of TBI and recurrent respiratory failure requiring tracheostomy tube placement. He's previously had a tracheostomy tube placed a few years ago and was successfully decannulated.   He now had a cuffless 7.5 tracheostomy tube in place. He is oxygenating well on room air and is able to vocalize and breath around the tracheostomy tube with a finger occlusion test. I am unable to elicit history from him with the finger occlusion test. Given he has a 7.5 mm ID trach, I would recommend downsizing to a 6.5 mm ID cuffless tracheostomy tube, and if tolerates well, would initiate daily red cap trials. Would start with observed capping trials during the day, and if tolerates well would transition to 24 hour capping trials. If  does well, will consider decannulation.  -down size trach to 6.5 mm ID -daily capping trials  Belva November, MD [facility] 05/14/2024 11:16 AM    Labs   CBC: No results for input(s): WBC, NEUTROABS, HGB, HCT, MCV, PLT in the last 168 hours.  Basic Metabolic Panel: No results for input(s): NA, K, CL, CO2, GLUCOSE, BUN, CREATININE, CALCIUM,  MG, PHOS in the last 168 hours. GFR: CrCl cannot be calculated (Patient's most recent lab result is older than the maximum 21 days allowed.). No results for input(s): PROCALCITON, WBC, LATICACIDVEN in the last 168 hours.  Liver Function Tests: No results for input(s): AST, ALT, ALKPHOS, BILITOT, PROT, ALBUMIN  in the last 168 hours. No results for input(s): LIPASE, AMYLASE in the last 168 hours. No results for input(s): AMMONIA in the last 168 hours.  ABG    Component Value Date/Time   PHART 7.44 12/01/2023 1143   PCO2ART 34 12/01/2023 1143   PO2ART 63 (L) 12/01/2023 1143   HCO3 23.1 12/01/2023 1143   TCO2 28 05/31/2021 0404   ACIDBASEDEF 0.5 12/01/2023 1143   O2SAT 93 12/01/2023 1143     Coagulation Profile: No results for input(s): INR, PROTIME in the last 168 hours.  Cardiac Enzymes: No results for input(s): CKTOTAL, CKMB, CKMBINDEX, TROPONINI in the last 168 hours.  HbA1C: Hgb A1c MFr Bld  Date/Time Value Ref Range Status  04/11/2022 06:10 AM 5.1 4.8 - 5.6 % Final    Comment:    (NOTE) Pre diabetes:          5.7%-6.4%  Diabetes:              >6.4%  Glycemic control for   <7.0% adults with diabetes   05/31/2021 04:18 PM 4.9 4.8 - 5.6 % Final    Comment:    (NOTE) Pre diabetes:          5.7%-6.4%  Diabetes:              >6.4%  Glycemic control for   <7.0% adults with diabetes     CBG: No results for input(s): GLUCAP in the last 168 hours.  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   Surgical History:   Past Surgical History:  Procedure Laterality Date   GASTROSTOMY TUBE PLACEMENT     IR GASTROSTOMY TUBE MOD SED  12/05/2023   TRACHEOSTOMY TUBE PLACEMENT N/A 12/07/2023   Procedure: TRACHEOSTOMY;   Surgeon: Rumalda Massie RAMAN, MD;  Location: [facility] ORS;  Service: ENT;  Laterality: N/A;     Social History:   reports that he has never smoked. He has never used smokeless tobacco. He reports that he does not currently use alcohol. He reports that he does not use drugs.   Family History:  His family history is not on file.   Allergies No Known Allergies   Home Medications  Prior to Admission medications   Medication Sig Start Date End Date Taking? Authorizing Provider  baclofen  (LIORESAL ) 10 MG tablet Take 10 mg by mouth 3 (three) times daily.   Yes [provider]  diazePAM , 15 MG Dose, (VALTOCO  15 MG DOSE) 2 x 7.5 MG/0.1ML LQPK Spray 7.5 mg into each nostril in the event of a seizure 04/20/22  Yes Perri DELENA Meliton Mickey., MD  gabapentin  (NEURONTIN ) 400 MG capsule  Take 1 capsule (400 mg total) by mouth 3 (three) times daily. 04/20/22 11/20/23 Yes Perri DELENA Meliton Mickey., MD  lacosamide  100 MG TABS Take 1 tablet (100 mg total) by mouth 2 (two) times daily. 04/20/22 11/20/23 Yes Perri DELENA Meliton Mickey., MD  melatonin 5 MG TABS Take 10 mg by mouth at bedtime.   Yes [provider]  QUEtiapine  (SEROQUEL  XR) 200 MG 24 hr tablet Take 200 mg by mouth in the morning.   Yes [provider]  QUEtiapine  (SEROQUEL ) 25 MG tablet Take 25 mg by mouth at bedtime.   Yes [provider]  valproic  acid (DEPAKENE ) 250 MG capsule Take 4 capsules (1,000 mg total) by mouth 2 (two) times daily. Patient taking differently: Take 500 mg by mouth every morning. 04/20/22 11/20/23 Yes Perri DELENA Meliton Mickey., MD  valproic  acid (DEPAKENE ) 250 MG capsule Take 750 mg by mouth every evening.   Yes [provider]      I spent 35 minutes caring for this patient today, including preparing to see the patient, obtaining a medical history , reviewing a separately obtained history, performing a medically appropriate examination and/or evaluation, counseling and educating the  patient/family/caregiver, ordering medications, tests, or procedures, documenting clinical information in the electronic health record, and independently interpreting results (not separately reported/billed) and communicating results to the patient/family/caregiver
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
## 2024-05-14 NOTE — Progress Notes (Signed)
 Tracheostomy cap placed after downsizing trach.  Respirations even and unlabored.  SpO2 remained 95%+.  Will continue to monitor through trial.

## 2024-05-14 NOTE — Plan of Care (Signed)
  Problem: Fluid Volume: Goal: Hemodynamic stability will improve Outcome: Progressing   Problem: Clinical Measurements: Goal: Signs and symptoms of infection will decrease Outcome: Progressing   Problem: Respiratory: Goal: Ability to maintain adequate ventilation will improve Outcome: Progressing   Problem: Activity: Goal: Ability to tolerate increased activity will improve Outcome: Progressing   Problem: Respiratory: Goal: Ability to maintain a clear airway and adequate ventilation will improve Outcome: Progressing   Problem: Role Relationship: Goal: Method of communication will improve Outcome: Progressing   Problem: Clinical Measurements: Goal: Ability to maintain clinical measurements within normal limits will improve Outcome: Progressing Goal: Will remain free from infection Outcome: Progressing Goal: Diagnostic test results will improve Outcome: Progressing Goal: Respiratory complications will improve Outcome: Progressing Goal: Cardiovascular complication will be avoided Outcome: Progressing   Problem: Activity: Goal: Risk for activity intolerance will decrease Outcome: Progressing   Problem: Nutrition: Goal: Adequate nutrition will be maintained Outcome: Progressing   Problem: Coping: Goal: Level of anxiety will decrease Outcome: Progressing   Problem: Elimination: Goal: Will not experience complications related to bowel motility Outcome: Progressing Goal: Will not experience complications related to urinary retention Outcome: Progressing   Problem: Pain Managment: Goal: General experience of comfort will improve and/or be controlled Outcome: Progressing   Problem: Safety: Goal: Ability to remain free from injury will improve Outcome: Progressing   Problem: Skin Integrity: Goal: Risk for impaired skin integrity will decrease Outcome: Progressing   Problem: Activity: Goal: Ability to tolerate increased activity will improve Outcome:  Progressing   Problem: Respiratory: Goal: Ability to maintain a clear airway and adequate ventilation will improve Outcome: Progressing   Problem: Role Relationship: Goal: Method of communication will improve Outcome: Progressing

## 2024-05-14 NOTE — Progress Notes (Signed)
 Tracheostomy downsized from #6 Shiley to #4 Shiley.  Change completed without incident.  HR 71 RR 18  SpO2 95% on room air

## 2024-05-15 DIAGNOSIS — J9601 Acute respiratory failure with hypoxia: Secondary | ICD-10-CM | POA: Diagnosis not present

## 2024-05-15 DIAGNOSIS — Z87820 Personal history of traumatic brain injury: Secondary | ICD-10-CM | POA: Diagnosis not present

## 2024-05-15 DIAGNOSIS — Z930 Tracheostomy status: Secondary | ICD-10-CM | POA: Diagnosis not present

## 2024-05-15 LAB — BASIC METABOLIC PANEL WITH GFR
Anion gap: 11 (ref 5–15)
BUN: 20 mg/dL (ref 6–20)
CO2: 28 mmol/L (ref 22–32)
Calcium: 9.3 mg/dL (ref 8.9–10.3)
Chloride: 103 mmol/L (ref 98–111)
Creatinine, Ser: 0.53 mg/dL — ABNORMAL LOW (ref 0.61–1.24)
GFR, Estimated: >60 mL/min (ref >60)
Glucose, Bld: 77 mg/dL (ref 70–99)
Potassium: 4.6 mmol/L (ref 3.5–5.1)
Sodium: 142 mmol/L (ref 135–145)

## 2024-05-15 LAB — CBC
HCT: 44 % (ref 39.0–52.0)
Hemoglobin: 14.6 g/dL (ref 13.0–17.0)
MCH: 29.7 pg (ref 26.0–34.0)
MCHC: 33.2 g/dL (ref 30.0–36.0)
MCV: 89.4 fL (ref 80.0–100.0)
Platelets: 218 K/uL (ref 150–400)
RBC: 4.92 MIL/uL (ref 4.22–5.81)
RDW: 14 % (ref 11.5–15.5)
WBC: 4.9 K/uL (ref 4.0–10.5)
nRBC: 0 % (ref 0.0–0.2)

## 2024-05-15 LAB — PHOSPHORUS: Phosphorus: 3.6 mg/dL (ref 2.5–4.6)

## 2024-05-15 LAB — MAGNESIUM: Magnesium: 2 mg/dL (ref 1.7–2.4)

## 2024-05-15 NOTE — Progress Notes (Signed)
 NAME:  Alex [name], MRN:  [id number], DOB:  [birth year], LOS: 178 ADMISSION DATE:  11/19/2023, CHIEF COMPLAINT:  Tracheostomy tube in place   History of Present Illness:  Alex [name] is a 28 year old male with a history of TBI and epilepsy who presented to the hospital on 11/19/2023 with altered mental status. Alex [name] developed respiratory failure secondary to aspiration, requiring intubation.   Patient was admitted 1/28 to the ICU for respiratory failure requiring intubation and mechanical ventilation. Alex [name] was bronched on 11/22/2023 with therapeutic aspiration of secretions, with cultures returning positive for pseudomonas. Alex [name] was extubated on 11/27/2023, but unfortunately had recurrence of symptoms with respiratory failure on 2/8 secondary to aspiration and mucus plugging, requiring intubation and mechanical ventilation.    Patient underwent tracheostomy tube placement with ENT on 12/07/2023. Alex [name] progressed through his care and was transferred to the floor. His secretion burden has improved, and Alex [name] has been on room air, with no secretions noted. Patient placement has been difficult, and Alex [name] remains in the hospital pending disposition.  Pertinent  Medical History  -TBI -Seizure Disorder  Significant Hospital Events: Including procedures, antibiotic start and stop dates in addition to other pertinent events   1/28: admit, intubated 2/4: extubated 2/8: reintubated 2/14: tracheostomy tube placed 7/24: downsized to 6.5 mm ID cuffless Shiley, red cap during the day, removed at night 7/25: red cap in AM. No change clinically.  Interim History / Subjective:  Patient remains agitated and combative, trying to punch. Verbalizing curse words.  Objective    Blood pressure (!) 131/56, pulse 79, temperature 98 F (36.7 C), resp. rate 14, height 5' 9.02 (1.753 m), weight 70.3 kg, SpO2 97%.        Intake/Output Summary (Last 24 hours) at 05/15/2024 1531 Last data filed at 05/15/2024 1520 Gross per 24 hour   Intake 1761.25 ml  Output 950 ml  Net 811.25 ml   Filed Weights   05/12/24 0519 05/13/24 0329 05/15/24 0500  Weight: 69.9 kg 68.4 kg 70.3 kg    Examination: Physical Exam Constitutional:      General: Alex [name] is not in acute distress.    Appearance: Alex [name] is ill-appearing.  Neck:     Comments: 6.5 mm ID shiley tube in place Cardiovascular:     Rate and Rhythm: Normal rate and regular rhythm.     Pulses: Normal pulses.     Heart sounds: Normal heart sounds.  Pulmonary:     Effort: Pulmonary effort is normal.     Breath sounds: Normal breath sounds.  Neurological:     General: No focal deficit present.     Mental Status: Alex [name] is alert and oriented to person, place, and time. Mental status is at baseline.     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 Alex [name]'s previously had a tracheostomy tube placed a few years ago and was successfully decannulated.    Alex [name] initially had a cuffless 7.5 tracheostomy tube, now downsized to a 6.5 mm ID cuffless shiley. Alex [name] is doing well with it and is able to vocalize easily. Alex [name] successfully passed a red cap trial yesterday during the day and remains with minimal secretions. Will proceed with 24 hour red cap trials and continue to work towards decannulation. If does well, will consider decannulation.  -24 hour red cap trials -will continue to follow -patient's mother updated re progress  over the phone  Belva November, MD [facility] 05/15/2024 3:34 PM    Labs   CBC: Recent Labs  Lab 05/15/24 0653  WBC 4.9  HGB 14.6  HCT 44.0  MCV 89.4  PLT 218    Basic Metabolic Panel: Recent Labs  Lab 05/15/24 0546  NA 142  K 4.6  CL 103  CO2 28  GLUCOSE 77  BUN 20  CREATININE 0.53*  CALCIUM 9.3  MG 2.0  PHOS 3.6   GFR: Estimated Creatinine Clearance: 136.7 mL/min (A) (by  C-G formula based on SCr of 0.53 mg/dL (L)). Recent Labs  Lab 05/15/24 0653  WBC 4.9    Liver Function Tests: No results for input(s): AST, ALT, ALKPHOS, BILITOT, PROT, ALBUMIN  in the last 168 hours. No results for input(s): LIPASE, AMYLASE in the last 168 hours. No results for input(s): AMMONIA in the last 168 hours.  ABG    Component Value Date/Time   PHART 7.44 12/01/2023 1143   PCO2ART 34 12/01/2023 1143   PO2ART 63 (L) 12/01/2023 1143   HCO3 23.1 12/01/2023 1143   TCO2 28 05/31/2021 0404   ACIDBASEDEF 0.5 12/01/2023 1143   O2SAT 93 12/01/2023 1143     Coagulation Profile: No results for input(s): INR, PROTIME in the last 168 hours.  Cardiac Enzymes: No results for input(s): CKTOTAL, CKMB, CKMBINDEX, TROPONINI in the last 168 hours.  HbA1C: Hgb A1c MFr Bld  Date/Time Value Ref Range Status  04/11/2022 06:10 AM 5.1 4.8 - 5.6 % Final    Comment:    (NOTE) Pre diabetes:          5.7%-6.4%  Diabetes:              >6.4%  Glycemic control for   <7.0% adults with diabetes   05/31/2021 04:18 PM 4.9 4.8 - 5.6 % Final    Comment:    (NOTE) Pre diabetes:          5.7%-6.4%  Diabetes:              >6.4%  Glycemic control for   <7.0% adults with diabetes     CBG: No results for input(s): GLUCAP in the last 168 hours.  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   Surgical History:   Past Surgical History:  Procedure Laterality Date   GASTROSTOMY TUBE PLACEMENT     IR GASTROSTOMY TUBE MOD SED  12/05/2023   TRACHEOSTOMY TUBE PLACEMENT N/A 12/07/2023   Procedure: TRACHEOSTOMY;  Surgeon: Rumalda Massie RAMAN, MD;  Location: [facility] ORS;  Service: ENT;  Laterality: N/A;     Social History:   reports that Alex [name] has  never smoked. Alex [name] has never used smokeless tobacco. Alex [name] reports that Alex [name] does not currently use alcohol. Alex [name] reports that Alex [name] does not use drugs.   Family History:  His family history is not on file.   Allergies No Known Allergies   Home Medications  Prior to Admission medications   Medication Sig Start Date End Date Taking? Authorizing Provider  baclofen  (LIORESAL ) 10 MG tablet Take 10 mg by mouth 3 (three) times daily.   Yes [provider]  diazePAM , 15 MG Dose, (VALTOCO  15 MG DOSE) 2 x 7.5 MG/0.1ML LQPK Spray 7.5 mg into each nostril in the event of a seizure  04/20/22  Yes Perri DELENA Meliton Mickey., MD  gabapentin  (NEURONTIN ) 400 MG capsule Take 1 capsule (400 mg total) by mouth 3 (three) times daily. 04/20/22 11/20/23 Yes Perri DELENA Meliton Mickey., MD  lacosamide  100 MG TABS Take 1 tablet (100 mg total) by mouth 2 (two) times daily. 04/20/22 11/20/23 Yes Perri DELENA Meliton Mickey., MD  melatonin 5 MG TABS Take 10 mg by mouth at bedtime.   Yes [provider]  QUEtiapine  (SEROQUEL  XR) 200 MG 24 hr tablet Take 200 mg by mouth in the morning.   Yes [provider]  QUEtiapine  (SEROQUEL ) 25 MG tablet Take 25 mg by mouth at bedtime.   Yes [provider]  valproic  acid (DEPAKENE ) 250 MG capsule Take 4 capsules (1,000 mg total) by mouth 2 (two) times daily. Patient taking differently: Take 500 mg by mouth every morning. 04/20/22 11/20/23 Yes Perri DELENA Meliton Mickey., MD  valproic  acid (DEPAKENE ) 250 MG capsule Take 750 mg by mouth every evening.   Yes [provider]     I spent 35 minutes caring for this patient today, including preparing to see the patient, obtaining a medical history , reviewing a separately obtained history, performing a medically appropriate examination and/or evaluation, counseling and educating the patient/family/caregiver, ordering medications, tests, or procedures, documenting clinical information in the electronic health record, and independently  interpreting results (not separately reported/billed) and communicating results to the patient/family/caregiver
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
## 2024-05-15 NOTE — Plan of Care (Signed)
  Problem: Fluid Volume: Goal: Hemodynamic stability will improve Outcome: Progressing   Problem: Clinical Measurements: Goal: Signs and symptoms of infection will decrease Outcome: Progressing   Problem: Respiratory: Goal: Ability to maintain adequate ventilation will improve Outcome: Progressing   Problem: Respiratory: Goal: Ability to maintain a clear airway and adequate ventilation will improve Outcome: Progressing   Problem: Role Relationship: Goal: Method of communication will improve Outcome: Progressing   Problem: Clinical Measurements: Goal: Ability to maintain clinical measurements within normal limits will improve Outcome: Progressing Goal: Will remain free from infection Outcome: Progressing Goal: Diagnostic test results will improve Outcome: Progressing Goal: Respiratory complications will improve Outcome: Progressing Goal: Cardiovascular complication will be avoided Outcome: Progressing   Problem: Nutrition: Goal: Adequate nutrition will be maintained Outcome: Progressing   Problem: Elimination: Goal: Will not experience complications related to bowel motility Outcome: Progressing Goal: Will not experience complications related to urinary retention Outcome: Progressing   Problem: Pain Managment: Goal: General experience of comfort will improve and/or be controlled Outcome: Progressing   Problem: Safety: Goal: Ability to remain free from injury will improve Outcome: Progressing   Problem: Skin Integrity: Goal: Risk for impaired skin integrity will decrease Outcome: Progressing   Problem: Respiratory: Goal: Ability to maintain a clear airway and adequate ventilation will improve Outcome: Progressing   Problem: Role Relationship: Goal: Method of communication will improve Outcome: Progressing   Problem: Activity: Goal: Ability to tolerate increased activity will improve Outcome: Not Progressing   Problem: Activity: Goal: Risk for activity  intolerance will decrease Outcome: Not Progressing   Problem: Coping: Goal: Level of anxiety will decrease Outcome: Not Progressing   Problem: Activity: Goal: Ability to tolerate increased activity will improve Outcome: Not Progressing

## 2024-05-15 NOTE — Plan of Care (Signed)
  Problem: Respiratory: Goal: Ability to maintain adequate ventilation will improve Outcome: Progressing   Problem: Respiratory: Goal: Ability to maintain a clear airway and adequate ventilation will improve Outcome: Progressing   Problem: Clinical Measurements: Goal: Ability to maintain clinical measurements within normal limits will improve Outcome: Progressing Goal: Will remain free from infection Outcome: Progressing   Problem: Nutrition: Goal: Adequate nutrition will be maintained Outcome: Progressing   Problem: Pain Managment: Goal: General experience of comfort will improve and/or be controlled Outcome: Progressing   Problem: Safety: Goal: Ability to remain free from injury will improve Outcome: Progressing

## 2024-05-16 DIAGNOSIS — J9601 Acute respiratory failure with hypoxia: Secondary | ICD-10-CM | POA: Diagnosis not present

## 2024-05-16 DIAGNOSIS — Z87820 Personal history of traumatic brain injury: Secondary | ICD-10-CM | POA: Diagnosis not present

## 2024-05-16 DIAGNOSIS — Z930 Tracheostomy status: Secondary | ICD-10-CM
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
## 2024-05-16 NOTE — Progress Notes (Signed)
 NAME:  Alex [name], MRN:  [id number], DOB:  Mar 03, 1996, LOS: 179 ADMISSION DATE:  11/19/2023, CHIEF COMPLAINT:  Tracheostomy tube in place   History of Present Illness:  Mr. Alex [name] is a 28 year old male with a history of TBI and epilepsy who presented to the hospital on 11/19/2023 with altered mental status. He developed respiratory failure secondary to aspiration, requiring intubation.   Patient was admitted 1/28 to the ICU for respiratory failure requiring intubation and mechanical ventilation. He was bronched on 11/22/2023 with therapeutic aspiration of secretions, with cultures returning positive for pseudomonas. He was extubated on 11/27/2023, but unfortunately had recurrence of symptoms with respiratory failure on 2/8 secondary to aspiration and mucus plugging, requiring intubation and mechanical ventilation.    Patient underwent tracheostomy tube placement with ENT on 12/07/2023. He progressed through his care and was transferred to the floor. His secretion burden has improved, and he has been on room air, with no secretions noted. Patient placement has been difficult, and he remains in the hospital pending disposition.  Pertinent  Medical History  -TBI -Seizure Disorder  Significant Hospital Events: Including procedures, antibiotic start and stop dates in addition to other pertinent events   1/28: admit, intubated 2/4: extubated 2/8: reintubated 2/14: tracheostomy tube placed 7/23: downsized to 6.5 mm ID cuffless Shiley, red cap during the day, removed at night 7/24: red cap in AM. No change clinically. 7/25: red cap over the past 24 hours, tolerating well  Interim History / Subjective:  Remains agitated and combative. Disoriented  Objective    Blood pressure 120/72, pulse 62, temperature 98 F (36.7 C), resp. rate 17, height 5' 9.02 (1.753 m), weight 67.8 kg, SpO2 98%.        Intake/Output Summary (Last 24 hours) at 05/16/2024 1522 Last data filed at 05/16/2024 1135 Gross  per 24 hour  Intake 1742.59 ml  Output 650 ml  Net 1092.59 ml   Filed Weights   05/13/24 0329 05/15/24 0500 05/16/24 0412  Weight: 68.4 kg 70.3 kg 67.8 kg    Examination: Physical Exam Constitutional:      General: He is not in acute distress.    Appearance: He is ill-appearing.  Neck:     Comments: 6.5 mm ID shiley tube in place Cardiovascular:     Rate and Rhythm: Normal rate and regular rhythm.     Pulses: Normal pulses.     Heart sounds: Normal heart sounds.  Pulmonary:     Effort: Pulmonary effort is normal.     Breath sounds: Normal breath sounds.  Neurological:     General: No focal deficit present.     Mental Status: He is alert and oriented to person, place, and time. Mental status is at baseline.     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 He's previously had a tracheostomy tube placed a few years ago and was successfully decannulated.    He initially had a cuffless 7.5 tracheostomy tube, now downsized to a 6.5 mm ID cuffless shiley. He is doing well with it and is able to vocalize easily. He successfully passed a red cap trial during the day, and has also done well with a 24 hour red cap trial since yesterday. Will plan another 24 hour capping trial, and if successful, will consider decannulation this weekend.  - Continue 24 hour red cap trial  -  We will continue to follow  Belva November, MD [facility] 05/16/2024 3:26 PM    Labs   CBC: Recent Labs  Lab 05/15/24 0653  WBC 4.9  HGB 14.6  HCT 44.0  MCV 89.4  PLT 218    Basic Metabolic Panel: Recent Labs  Lab 05/15/24 0546  NA 142  K 4.6  CL 103  CO2 28  GLUCOSE 77  BUN 20  CREATININE 0.53*  CALCIUM 9.3  MG 2.0  PHOS 3.6   GFR: Estimated Creatinine Clearance: 131.8 mL/min (A) (by C-G formula based on SCr of  0.53 mg/dL (L)). Recent Labs  Lab 05/15/24 0653  WBC 4.9    Liver Function Tests: No results for input(s): AST, ALT, ALKPHOS, BILITOT, PROT, ALBUMIN  in the last 168 hours. No results for input(s): LIPASE, AMYLASE in the last 168 hours. No results for input(s): AMMONIA in the last 168 hours.  ABG    Component Value Date/Time   PHART 7.44 12/01/2023 1143   PCO2ART 34 12/01/2023 1143   PO2ART 63 (L) 12/01/2023 1143   HCO3 23.1 12/01/2023 1143   TCO2 28 05/31/2021 0404   ACIDBASEDEF 0.5 12/01/2023 1143   O2SAT 93 12/01/2023 1143     Coagulation Profile: No results for input(s): INR, PROTIME in the last 168 hours.  Cardiac Enzymes: No results for input(s): CKTOTAL, CKMB, CKMBINDEX, TROPONINI in the last 168 hours.  HbA1C: Hgb A1c MFr Bld  Date/Time Value Ref Range Status  04/11/2022 06:10 AM 5.1 4.8 - 5.6 % Final    Comment:    (NOTE) Pre diabetes:          5.7%-6.4%  Diabetes:              >6.4%  Glycemic control for   <7.0% adults with diabetes   05/31/2021 04:18 PM 4.9 4.8 - 5.6 % Final    Comment:    (NOTE) Pre diabetes:          5.7%-6.4%  Diabetes:              >6.4%  Glycemic control for   <7.0% adults with diabetes     CBG: No results for input(s): GLUCAP in the last 168 hours.  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   Surgical History:   Past Surgical History:  Procedure Laterality Date   GASTROSTOMY TUBE PLACEMENT     IR GASTROSTOMY TUBE MOD SED  12/05/2023   TRACHEOSTOMY TUBE PLACEMENT N/A 12/07/2023   Procedure: TRACHEOSTOMY;  Surgeon: Rumalda Massie RAMAN, MD;  Location: [facility] ORS;  Service: ENT;  Laterality: N/A;     Social History:   reports that he has never smoked. He has never  used smokeless tobacco. He reports that he does not currently use alcohol. He reports that he does not use drugs.   Family History:  His family history is not on file.   Allergies No Known Allergies   Home Medications  Prior to Admission medications   Medication Sig Start Date End Date Taking? Authorizing Provider  baclofen  (LIORESAL ) 10 MG tablet Take 10 mg by mouth 3 (three) times daily.   Yes [provider]  diazePAM , 15 MG Dose, (VALTOCO  15 MG DOSE) 2 x 7.5 MG/0.1ML LQPK Spray 7.5 mg into each nostril in the event of  a seizure 04/20/22  Yes Perri DELENA Meliton Mickey., MD  gabapentin  (NEURONTIN ) 400 MG capsule Take 1 capsule (400 mg total) by mouth 3 (three) times daily. 04/20/22 11/20/23 Yes Perri DELENA Meliton Mickey., MD  lacosamide  100 MG TABS Take 1 tablet (100 mg total) by mouth 2 (two) times daily. 04/20/22 11/20/23 Yes Perri DELENA Meliton Mickey., MD  melatonin 5 MG TABS Take 10 mg by mouth at bedtime.   Yes [provider]  QUEtiapine  (SEROQUEL  XR) 200 MG 24 hr tablet Take 200 mg by mouth in the morning.   Yes [provider]  QUEtiapine  (SEROQUEL ) 25 MG tablet Take 25 mg by mouth at bedtime.   Yes [provider]  valproic  acid (DEPAKENE ) 250 MG capsule Take 4 capsules (1,000 mg total) by mouth 2 (two) times daily. Patient taking differently: Take 500 mg by mouth every morning. 04/20/22 11/20/23 Yes Perri DELENA Meliton Mickey., MD  valproic  acid (DEPAKENE ) 250 MG capsule Take 750 mg by mouth every evening.   Yes [provider]     I spent 36 minutes caring for this patient today, including preparing to see the patient, obtaining a medical history , reviewing a separately obtained history, performing a medically appropriate examination and/or evaluation, counseling and educating the patient/family/caregiver, ordering medications, tests, or procedures, documenting clinical information in the electronic health record, independently interpreting results (not  separately reported/billed) and communicating results to the patient/family/caregiver, and care coordination (not separately reported/billed)

## 2024-05-16 NOTE — Plan of Care (Signed)
  Problem: Fluid Volume: Goal: Hemodynamic stability will improve Outcome: Progressing   Problem: Clinical Measurements: Goal: Signs and symptoms of infection will decrease Outcome: Progressing   Problem: Respiratory: Goal: Ability to maintain adequate ventilation will improve Outcome: Progressing   Problem: Respiratory: Goal: Ability to maintain a clear airway and adequate ventilation will improve Outcome: Progressing   Problem: Role Relationship: Goal: Method of communication will improve Outcome: Progressing   Problem: Clinical Measurements: Goal: Ability to maintain clinical measurements within normal limits will improve Outcome: Progressing Goal: Will remain free from infection Outcome: Progressing Goal: Diagnostic test results will improve Outcome: Progressing Goal: Respiratory complications will improve Outcome: Progressing Goal: Cardiovascular complication will be avoided Outcome: Progressing   Problem: Nutrition: Goal: Adequate nutrition will be maintained Outcome: Progressing   Problem: Elimination: Goal: Will not experience complications related to bowel motility Outcome: Progressing Goal: Will not experience complications related to urinary retention Outcome: Progressing   Problem: Pain Managment: Goal: General experience of comfort will improve and/or be controlled Outcome: Progressing   Problem: Safety: Goal: Ability to remain free from injury will improve Outcome: Progressing   Problem: Skin Integrity: Goal: Risk for impaired skin integrity will decrease Outcome: Progressing   Problem: Respiratory: Goal: Ability to maintain a clear airway and adequate ventilation will improve Outcome: Progressing   Problem: Role Relationship: Goal: Method of communication will improve Outcome: Progressing   Problem: Activity: Goal: Ability to tolerate increased activity will improve Outcome: Not Progressing   Problem: Activity: Goal: Risk for activity  intolerance will decrease Outcome: Not Progressing   Problem: Coping: Goal: Level of anxiety will decrease Outcome: Not Progressing   Problem: Activity: Goal: Ability to tolerate increased activity will improve Outcome: Not Progressing

## 2024-05-16 NOTE — Plan of Care (Signed)
  Problem: Respiratory: Goal: Ability to maintain adequate ventilation will improve Outcome: Progressing   Problem: Respiratory: Goal: Ability to maintain a clear airway and adequate ventilation will improve Outcome: Progressing   Problem: Clinical Measurements: Goal: Respiratory complications will improve Outcome: Progressing Goal: Cardiovascular complication will be avoided Outcome: Progressing   Problem: Nutrition: Goal: Adequate nutrition will be maintained Outcome: Progressing

## 2024-05-17 DIAGNOSIS — Z87820 Personal history of traumatic brain injury: Secondary | ICD-10-CM | POA: Diagnosis not present

## 2024-05-17 DIAGNOSIS — Z930 Tracheostomy status: Secondary | ICD-10-CM | POA: Diagnosis not present

## 2024-05-17 DIAGNOSIS — J9601 Acute respiratory failure with hypoxia: Secondary | ICD-10-CM | POA: Diagnosis not present

## 2024-05-17 NOTE — Plan of Care (Signed)
   Problem: Respiratory: Goal: Ability to maintain adequate ventilation will improve Outcome: Progressing

## 2024-05-17 NOTE — Progress Notes (Signed)
 NAME:  Alex [name], MRN:  [id number], DOB:  07/06/1996, LOS: 180 ADMISSION DATE:  11/19/2023, CHIEF COMPLAINT:  Tracheostomy tube in place   History of Present Illness:  Mr. Nehme is a 28 year old male with a history of TBI and epilepsy who presented to the hospital on 11/19/2023 with altered mental status. He developed respiratory failure secondary to aspiration, requiring intubation.   Patient was admitted 1/28 to the ICU for respiratory failure requiring intubation and mechanical ventilation. He was bronched on 11/22/2023 with therapeutic aspiration of secretions, with cultures returning positive for pseudomonas. He was extubated on 11/27/2023, but unfortunately had recurrence of symptoms with respiratory failure on 2/8 secondary to aspiration and mucus plugging, requiring intubation and mechanical ventilation.    Patient underwent tracheostomy tube placement with ENT on 12/07/2023. He progressed through his care and was transferred to the floor. His secretion burden has improved, and he has been on room air, with no secretions noted. Patient placement has been difficult, and he remains in the hospital pending disposition.  Pertinent  Medical History  -TBI -Seizure Disorder  Significant Hospital Events: Including procedures, antibiotic start and stop dates in addition to other pertinent events   1/28: admit, intubated 2/4: extubated 2/8: reintubated 2/14: tracheostomy tube placed 7/23: downsized to 6.5 mm ID cuffless Shiley, red cap during the day, removed at night 7/24: red cap in AM. No change clinically. 7/25: red cap over the past 24 hours, tolerating well 7/26: remains on red cap overnight, tolerating well. Sleeping and calm  Interim History / Subjective:  Sleeping and calm at the time of my evaluation, no shortness of breath noted, no accessory muscle use. O2 sat at 100% on room air.  Objective    Blood pressure 117/89, pulse 74, temperature 98.7 F (37.1 C), temperature  source Oral, resp. rate 17, height 5' 9.02 (1.753 m), weight 68.4 kg, SpO2 100%.        Intake/Output Summary (Last 24 hours) at 05/17/2024 1330 Last data filed at 05/17/2024 1111 Gross per 24 hour  Intake 1857 ml  Output 1100 ml  Net 757 ml   Filed Weights   05/15/24 0500 05/16/24 0412 05/17/24 0440  Weight: 70.3 kg 67.8 kg 68.4 kg    Examination: Physical Exam Constitutional:      General: He is not in acute distress.    Appearance: He is ill-appearing.  Neck:     Comments: 6.5 mm ID shiley tube in place Cardiovascular:     Rate and Rhythm: Normal rate and regular rhythm.     Pulses: Normal pulses.     Heart sounds: Normal heart sounds.  Pulmonary:     Effort: Pulmonary effort is normal.     Breath sounds: Normal breath sounds.  Neurological:     General: No focal deficit present.     Mental Status: He is alert and oriented to person, place, and time. Mental status is at baseline.     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 He's previously had a tracheostomy tube placed a few years ago and was successfully decannulated.    He initially had a cuffless 7.5 tracheostomy tube, now downsized to a 6.5 mm ID cuffless shiley. He is doing well with it and is able to vocalize easily. He successfully passed a red cap trial during the day, and has also done  well with a 48 hour red cap trial. I will maintain him on a red cap trial for the next 24 hours and if remains stable will decannulate in the morning.   - Continue 24 hour red cap trial  - AM decannulation tomorrow  Belva November, MD [facility] 05/17/2024 1:31 PM   Labs   CBC: Recent Labs  Lab 05/15/24 0653  WBC 4.9  HGB 14.6  HCT 44.0  MCV 89.4  PLT 218    Basic Metabolic Panel: Recent Labs  Lab 05/15/24 0546  NA 142  K 4.6   CL 103  CO2 28  GLUCOSE 77  BUN 20  CREATININE 0.53*  CALCIUM 9.3  MG 2.0  PHOS 3.6   GFR: Estimated Creatinine Clearance: 133 mL/min (A) (by C-G formula based on SCr of 0.53 mg/dL (L)). Recent Labs  Lab 05/15/24 0653  WBC 4.9    Liver Function Tests: No results for input(s): AST, ALT, ALKPHOS, BILITOT, PROT, ALBUMIN  in the last 168 hours. No results for input(s): LIPASE, AMYLASE in the last 168 hours. No results for input(s): AMMONIA in the last 168 hours.  ABG    Component Value Date/Time   PHART 7.44 12/01/2023 1143   PCO2ART 34 12/01/2023 1143   PO2ART 63 (L) 12/01/2023 1143   HCO3 23.1 12/01/2023 1143   TCO2 28 05/31/2021 0404   ACIDBASEDEF 0.5 12/01/2023 1143   O2SAT 93 12/01/2023 1143     Coagulation Profile: No results for input(s): INR, PROTIME in the last 168 hours.  Cardiac Enzymes: No results for input(s): CKTOTAL, CKMB, CKMBINDEX, TROPONINI in the last 168 hours.  HbA1C: Hgb A1c MFr Bld  Date/Time Value Ref Range Status  04/11/2022 06:10 AM 5.1 4.8 - 5.6 % Final    Comment:    (NOTE) Pre diabetes:          5.7%-6.4%  Diabetes:              >6.4%  Glycemic control for   <7.0% adults with diabetes   05/31/2021 04:18 PM 4.9 4.8 - 5.6 % Final    Comment:    (NOTE) Pre diabetes:          5.7%-6.4%  Diabetes:              >6.4%  Glycemic control for   <7.0% adults with diabetes     CBG: No results for input(s): GLUCAP in the last 168 hours.  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   Surgical History:   Past Surgical History:  Procedure Laterality Date   GASTROSTOMY TUBE PLACEMENT     IR GASTROSTOMY TUBE MOD SED  12/05/2023   TRACHEOSTOMY TUBE PLACEMENT N/A 12/07/2023    Procedure: TRACHEOSTOMY;  Surgeon: Rumalda Massie RAMAN, MD;  Location: [facility] ORS;  Service: ENT;  Laterality: N/A;     Social History:   reports that he has never smoked. He has never used smokeless tobacco. He reports that he does not currently use alcohol. He reports that he does not use drugs.   Family History:  His family history is not on file.   Allergies No Known Allergies   Home Medications  Prior to Admission medications   Medication Sig Start Date End Date Taking? Authorizing Provider  baclofen  (LIORESAL ) 10 MG tablet  Take 10 mg by mouth 3 (three) times daily.   Yes [provider]  diazePAM , 15 MG Dose, (VALTOCO  15 MG DOSE) 2 x 7.5 MG/0.1ML LQPK Spray 7.5 mg into each nostril in the event of a seizure 04/20/22  Yes Perri DELENA Meliton Mickey., MD  gabapentin  (NEURONTIN ) 400 MG capsule Take 1 capsule (400 mg total) by mouth 3 (three) times daily. 04/20/22 11/20/23 Yes Perri DELENA Meliton Mickey., MD  lacosamide  100 MG TABS Take 1 tablet (100 mg total) by mouth 2 (two) times daily. 04/20/22 11/20/23 Yes Perri DELENA Meliton Mickey., MD  melatonin 5 MG TABS Take 10 mg by mouth at bedtime.   Yes [provider]  QUEtiapine  (SEROQUEL  XR) 200 MG 24 hr tablet Take 200 mg by mouth in the morning.   Yes [provider]  QUEtiapine  (SEROQUEL ) 25 MG tablet Take 25 mg by mouth at bedtime.   Yes [provider]  valproic  acid (DEPAKENE ) 250 MG capsule Take 4 capsules (1,000 mg total) by mouth 2 (two) times daily. Patient taking differently: Take 500 mg by mouth every morning. 04/20/22 11/20/23 Yes Perri DELENA Meliton Mickey., MD  valproic  acid (DEPAKENE ) 250 MG capsule Take 750 mg by mouth every evening.   Yes [provider]      I spent 35 minutes caring for this patient today, including preparing to see the patient, obtaining a medical history , reviewing a separately obtained history, performing a medically appropriate examination and/or evaluation, counseling and educating  the patient/family/caregiver, ordering medications, tests, or procedures, documenting clinical information in the electronic health record, and independently interpreting results (not separately reported/billed) and communicating results to the patient/family/caregiver
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
## 2024-05-17 NOTE — Plan of Care (Signed)
  Problem: Fluid Volume: Goal: Hemodynamic stability will improve Outcome: Progressing   Problem: Clinical Measurements: Goal: Signs and symptoms of infection will decrease Outcome: Progressing   Problem: Respiratory: Goal: Ability to maintain adequate ventilation will improve Outcome: Progressing   

## 2024-05-18 DIAGNOSIS — Z930 Tracheostomy status: Secondary | ICD-10-CM | POA: Diagnosis not present

## 2024-05-18 DIAGNOSIS — Z87820 Personal history of traumatic brain injury: Secondary | ICD-10-CM | POA: Diagnosis not present

## 2024-05-18 DIAGNOSIS — J9601 Acute respiratory failure with hypoxia: Secondary | ICD-10-CM | POA: Diagnosis not present

## 2024-05-18 MED ORDER — JEVITY 1.5 CAL/FIBER PO LIQD
1000.0000 mL | ORAL | Status: DC
  Administered 2024-05-19 – 2024-05-21 (×4): 1000 mL

## 2024-05-18 NOTE — Progress Notes (Signed)
 NAME:  Alex [name], MRN:  [id number], DOB:  20-Dec-1995, LOS: 181 ADMISSION DATE:  11/19/2023, CHIEF COMPLAINT:  Tracheostomy tube in place   History of Present Illness:  Alex [name] is a 28 year old male with a history of TBI and epilepsy who presented to the hospital on 11/19/2023 with altered mental status. He developed respiratory failure secondary to aspiration, requiring intubation.   Patient was admitted 1/28 to the ICU for respiratory failure requiring intubation and mechanical ventilation. He was bronched on 11/22/2023 with therapeutic aspiration of secretions, with cultures returning positive for pseudomonas. He was extubated on 11/27/2023, but unfortunately had recurrence of symptoms with respiratory failure on 2/8 secondary to aspiration and mucus plugging, requiring intubation and mechanical ventilation.    Patient underwent tracheostomy tube placement with ENT on 12/07/2023. He progressed through his care and was transferred to the floor. His secretion burden has improved, and he has been on room air, with no secretions noted. Patient placement has been difficult, and he remains in the hospital pending disposition.  Pertinent  Medical History  -TBI -Seizure Disorder  Significant Hospital Events: Including procedures, antibiotic start and stop dates in addition to other pertinent events   1/28: admit, intubated 2/4: extubated 2/8: reintubated 2/14: tracheostomy tube placed 7/23: downsized to 6.5 mm ID cuffless Shiley, red cap during the day, removed at night 7/24: red cap in AM. No change clinically. 7/25: red cap over the past 24 hours, tolerating well 7/26: remains on red cap overnight, tolerating well. Sleeping and calm 7/27: remains on red cap, no distress.  Interim History / Subjective:  Sating well on room air, no respiratory distress. Unchanged mentation.  Objective    Blood pressure 131/84, pulse 87, temperature 97.8 F (36.6 C), resp. rate 16, height 5' 9.02  (1.753 m), weight 68.4 kg, SpO2 99%.        Intake/Output Summary (Last 24 hours) at 05/18/2024 1308 Last data filed at 05/18/2024 0831 Gross per 24 hour  Intake 740 ml  Output 1900 ml  Net -1160 ml   Filed Weights   05/16/24 0412 05/17/24 0440 05/18/24 0500  Weight: 67.8 kg 68.4 kg 68.4 kg    Examination: Physical Exam Constitutional:      General: He is not in acute distress.    Appearance: He is ill-appearing.  Neck:     Comments: 6.5 mm ID shiley tube in place Cardiovascular:     Rate and Rhythm: Normal rate and regular rhythm.     Pulses: Normal pulses.     Heart sounds: Normal heart sounds.  Pulmonary:     Effort: Pulmonary effort is normal.     Breath sounds: Normal breath sounds.  Neurological:     General: No focal deficit present.     Mental Status: He is alert and oriented to person, place, and time. Mental status is at baseline.     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 He's previously had a tracheostomy tube placed a few years ago and was successfully decannulated.    He initially had a cuffless 7.5 tracheostomy tube, now downsized to a 6.5 mm ID cuffless shiley. He is doing well with it and is able to vocalize easily. He successfully passed a red cap trial during the day, and has also done well with a > 48 hours of red cap trial. Plan  for decannulation today  -decannulation at bedside today.    Belva November, MD [facility] 05/18/2024 1:08 PM   Labs   CBC: Recent Labs  Lab 05/15/24 0653  WBC 4.9  HGB 14.6  HCT 44.0  MCV 89.4  PLT 218    Basic Metabolic Panel: Recent Labs  Lab 05/15/24 0546  NA 142  K 4.6  CL 103  CO2 28  GLUCOSE 77  BUN 20  CREATININE 0.53*  CALCIUM 9.3  MG 2.0  PHOS 3.6   GFR: Estimated Creatinine Clearance: 133 mL/min (A) (by  C-G formula based on SCr of 0.53 mg/dL (L)). Recent Labs  Lab 05/15/24 0653  WBC 4.9    Liver Function Tests: No results for input(s): AST, ALT, ALKPHOS, BILITOT, PROT, ALBUMIN  in the last 168 hours. No results for input(s): LIPASE, AMYLASE in the last 168 hours. No results for input(s): AMMONIA in the last 168 hours.  ABG    Component Value Date/Time   PHART 7.44 12/01/2023 1143   PCO2ART 34 12/01/2023 1143   PO2ART 63 (L) 12/01/2023 1143   HCO3 23.1 12/01/2023 1143   TCO2 28 05/31/2021 0404   ACIDBASEDEF 0.5 12/01/2023 1143   O2SAT 93 12/01/2023 1143     Coagulation Profile: No results for input(s): INR, PROTIME in the last 168 hours.  Cardiac Enzymes: No results for input(s): CKTOTAL, CKMB, CKMBINDEX, TROPONINI in the last 168 hours.  HbA1C: Hgb A1c MFr Bld  Date/Time Value Ref Range Status  04/11/2022 06:10 AM 5.1 4.8 - 5.6 % Final    Comment:    (NOTE) Pre diabetes:          5.7%-6.4%  Diabetes:              >6.4%  Glycemic control for   <7.0% adults with diabetes   05/31/2021 04:18 PM 4.9 4.8 - 5.6 % Final    Comment:    (NOTE) Pre diabetes:          5.7%-6.4%  Diabetes:              >6.4%  Glycemic control for   <7.0% adults with diabetes     CBG: No results for input(s): GLUCAP in the last 168 hours.  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   Surgical History:   Past Surgical History:  Procedure Laterality Date   GASTROSTOMY TUBE PLACEMENT     IR GASTROSTOMY TUBE MOD SED  12/05/2023   TRACHEOSTOMY TUBE PLACEMENT N/A 12/07/2023   Procedure: TRACHEOSTOMY;  Surgeon: Rumalda Massie RAMAN, MD;  Location: [facility] ORS;  Service: ENT;  Laterality: N/A;     Social History:   reports that he has  never smoked. He has never used smokeless tobacco. He reports that he does not currently use alcohol. He reports that he does not use drugs.   Family History:  His family history is not on file.   Allergies No Known Allergies   Home Medications  Prior to Admission medications   Medication Sig Start Date End Date Taking? Authorizing Provider  baclofen  (LIORESAL ) 10 MG tablet Take 10 mg by mouth 3 (three) times daily.   Yes [provider]  diazePAM , 15 MG Dose, (VALTOCO  15 MG DOSE) 2 x 7.5 MG/0.1ML LQPK Spray 7.5 mg into each nostril  in the event of a seizure 04/20/22  Yes Perri DELENA Meliton Mickey., MD  gabapentin  (NEURONTIN ) 400 MG capsule Take 1 capsule (400 mg total) by mouth 3 (three) times daily. 04/20/22 11/20/23 Yes Perri DELENA Meliton Mickey., MD  lacosamide  100 MG TABS Take 1 tablet (100 mg total) by mouth 2 (two) times daily. 04/20/22 11/20/23 Yes Perri DELENA Meliton Mickey., MD  melatonin 5 MG TABS Take 10 mg by mouth at bedtime.   Yes [provider]  QUEtiapine  (SEROQUEL  XR) 200 MG 24 hr tablet Take 200 mg by mouth in the morning.   Yes [provider]  QUEtiapine  (SEROQUEL ) 25 MG tablet Take 25 mg by mouth at bedtime.   Yes [provider]  valproic  acid (DEPAKENE ) 250 MG capsule Take 4 capsules (1,000 mg total) by mouth 2 (two) times daily. Patient taking differently: Take 500 mg by mouth every morning. 04/20/22 11/20/23 Yes Perri DELENA Meliton Mickey., MD  valproic  acid (DEPAKENE ) 250 MG capsule Take 750 mg by mouth every evening.   Yes [provider]      I spent 35 minutes caring for this patient today, including preparing to see the patient, obtaining a medical history , reviewing a separately obtained history, performing a medically appropriate examination and/or evaluation, counseling and educating the patient/family/caregiver, ordering medications, tests, or procedures, documenting clinical information in the electronic health record, and  independently interpreting results (not separately reported/billed) and communicating results to the patient/family/caregiver
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
## 2024-05-18 NOTE — Plan of Care (Signed)
  Problem: Respiratory: Goal: Ability to maintain adequate ventilation will improve Outcome: Progressing   Problem: Respiratory: Goal: Ability to maintain a clear airway and adequate ventilation will improve Outcome: Progressing

## 2024-05-18 NOTE — Progress Notes (Signed)
 Brief Progress Note:  Patient evaluated at the bedside, tolerating red cap trials for > 48 hours. No signs of respiratory distress. Decided to proceed with decannulation. RT and RN at the bedside. Shiley 6.0 ID cuffless trach remove in one piece, occlusive xeroform dressing placed. Patient tolerated well. Attending of record Dr. Trudy informed.  Belva November, MD [facility] 05/18/2024 1:08 PM

## 2024-05-18 NOTE — Progress Notes (Signed)
 Patient decannulation completed at approximately 1pm today. Continuous pulse oximetry placed. No s/s of respiratory distress noted. Dressing dry and in place. NPO orders continue, hold ice chips. Tube feeding continues to be held, orders to resume at 8am.
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
## 2024-05-19 NOTE — Plan of Care (Signed)
  Problem: Clinical Measurements: Goal: Diagnostic test results will improve Outcome: Progressing   Problem: Safety: Goal: Ability to remain free from injury will improve Outcome: Progressing   Problem: Activity: Goal: Ability to tolerate increased activity will improve Outcome: Progressing

## 2024-05-20 DIAGNOSIS — Z87820 Personal history of traumatic brain injury: Secondary | ICD-10-CM | POA: Diagnosis not present
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
## 2024-05-20 NOTE — Plan of Care (Signed)
  Problem: Fluid Volume: Goal: Hemodynamic stability will improve Outcome: Progressing   Problem: Clinical Measurements: Goal: Signs and symptoms of infection will decrease Outcome: Progressing   Problem: Respiratory: Goal: Ability to maintain adequate ventilation will improve Outcome: Progressing   Problem: Respiratory: Goal: Ability to maintain a clear airway and adequate ventilation will improve Outcome: Progressing   Problem: Role Relationship: Goal: Method of communication will improve Outcome: Progressing   Problem: Clinical Measurements: Goal: Ability to maintain clinical measurements within normal limits will improve Outcome: Progressing Goal: Will remain free from infection Outcome: Progressing Goal: Diagnostic test results will improve Outcome: Progressing Goal: Respiratory complications will improve Outcome: Progressing Goal: Cardiovascular complication will be avoided Outcome: Progressing   Problem: Nutrition: Goal: Adequate nutrition will be maintained Outcome: Progressing   Problem: Elimination: Goal: Will not experience complications related to bowel motility Outcome: Progressing Goal: Will not experience complications related to urinary retention Outcome: Progressing   Problem: Pain Managment: Goal: General experience of comfort will improve and/or be controlled Outcome: Progressing   Problem: Safety: Goal: Ability to remain free from injury will improve Outcome: Progressing   Problem: Skin Integrity: Goal: Risk for impaired skin integrity will decrease Outcome: Progressing   Problem: Respiratory: Goal: Ability to maintain a clear airway and adequate ventilation will improve Outcome: Progressing   Problem: Role Relationship: Goal: Method of communication will improve Outcome: Progressing   Problem: Activity: Goal: Ability to tolerate increased activity will improve Outcome: Not Progressing   Problem: Activity: Goal: Risk for activity  intolerance will decrease Outcome: Not Progressing   Problem: Coping: Goal: Level of anxiety will decrease Outcome: Not Progressing   Problem: Activity: Goal: Ability to tolerate increased activity will improve Outcome: Not Progressing
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
## 2024-05-21 DIAGNOSIS — Z87820 Personal history of traumatic brain injury: Secondary | ICD-10-CM | POA: Diagnosis not present
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
## 2024-05-22 DIAGNOSIS — Z87820 Personal history of traumatic brain injury: Secondary | ICD-10-CM | POA: Diagnosis not present

## 2024-05-22 LAB — CBC
HCT: 47.4 % (ref 39.0–52.0)
Hemoglobin: 15.3 g/dL (ref 13.0–17.0)
MCH: 29.6 pg (ref 26.0–34.0)
MCHC: 32.3 g/dL (ref 30.0–36.0)
MCV: 91.7 fL (ref 80.0–100.0)
Platelets: 204 K/uL (ref 150–400)
RBC: 5.17 MIL/uL (ref 4.22–5.81)
RDW: 13.7 % (ref 11.5–15.5)
WBC: 4.6 K/uL (ref 4.0–10.5)
nRBC: 0 % (ref 0.0–0.2)

## 2024-05-22 LAB — COMPREHENSIVE METABOLIC PANEL WITH GFR
ALT: 44 U/L (ref 0–44)
AST: 37 U/L (ref 15–41)
Albumin: 3.5 g/dL (ref 3.5–5.0)
Alkaline Phosphatase: 93 U/L (ref 38–126)
Anion gap: 11 (ref 5–15)
BUN: 15 mg/dL (ref 6–20)
CO2: 29 mmol/L (ref 22–32)
Calcium: 9.5 mg/dL (ref 8.9–10.3)
Chloride: 103 mmol/L (ref 98–111)
Creatinine, Ser: 0.52 mg/dL — ABNORMAL LOW (ref 0.61–1.24)
GFR, Estimated: >60 mL/min (ref >60)
Glucose, Bld: 99 mg/dL (ref 70–99)
Potassium: 4.5 mmol/L (ref 3.5–5.1)
Sodium: 143 mmol/L (ref 135–145)
Total Bilirubin: 0.7 mg/dL (ref 0.0–1.2)
Total Protein: 6.8 g/dL (ref 6.5–8.1)

## 2024-05-22 LAB — MAGNESIUM: Magnesium: 2.3 mg/dL (ref 1.7–2.4)

## 2024-05-22 LAB — PHOSPHORUS: Phosphorus: 3.2 mg/dL (ref 2.5–4.6)
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
## 2024-05-22 NOTE — TOC Progression Note (Signed)
 Transition of Care [facility]) - Progression Note    Patient Details  Name: Alex [name] MRN: [id number] Date of Birth: 1996-08-28  [facility] CM/SW Contact  Racheal LITTIE Schimke, RN Phone Number: 05/22/2024, 10:43 AM  Clinical Narrative:  Spoke with [facility], he's still following patient and trying to secure placement will keep us  informed when able to find placment.    Expected Discharge Plan: Skilled Nursing Facility Barriers to Discharge: Continued Medical Work up               Expected Discharge Plan and Services       Living arrangements for the past 2 months: Skilled Nursing Facility                                       Social Drivers of Health (SDOH) Interventions SDOH Screenings   Food Insecurity: Patient Unable To Answer (11/20/2023)  Housing: Patient Unable To Answer (11/20/2023)  Transportation Needs: Patient Unable To Answer (11/20/2023)  Utilities: Patient Unable To Answer (11/20/2023)  Financial Resource Strain: Low Risk  (12/02/2021)   Received from [facility]  Tobacco Use: Low Risk  (11/19/2023)    Readmission Risk Interventions     No data to display

## 2024-05-22 NOTE — Plan of Care (Signed)
  Problem: Fluid Volume: Goal: Hemodynamic stability will improve Outcome: Progressing   Problem: Clinical Measurements: Goal: Signs and symptoms of infection will decrease Outcome: Progressing   Problem: Respiratory: Goal: Ability to maintain adequate ventilation will improve Outcome: Progressing   Problem: Activity: Goal: Ability to tolerate increased activity will improve Outcome: Progressing   Problem: Respiratory: Goal: Ability to maintain a clear airway and adequate ventilation will improve Outcome: Progressing   Problem: Role Relationship: Goal: Method of communication will improve Outcome: Progressing   Problem: Clinical Measurements: Goal: Ability to maintain clinical measurements within normal limits will improve Outcome: Progressing Goal: Will remain free from infection Outcome: Progressing Goal: Diagnostic test results will improve Outcome: Progressing Goal: Respiratory complications will improve Outcome: Progressing Goal: Cardiovascular complication will be avoided Outcome: Progressing   Problem: Activity: Goal: Risk for activity intolerance will decrease Outcome: Progressing   Problem: Nutrition: Goal: Adequate nutrition will be maintained Outcome: Progressing   Problem: Coping: Goal: Level of anxiety will decrease Outcome: Progressing   Problem: Elimination: Goal: Will not experience complications related to bowel motility Outcome: Progressing Goal: Will not experience complications related to urinary retention Outcome: Progressing   Problem: Pain Managment: Goal: General experience of comfort will improve and/or be controlled Outcome: Progressing   Problem: Safety: Goal: Ability to remain free from injury will improve Outcome: Progressing   Problem: Skin Integrity: Goal: Risk for impaired skin integrity will decrease Outcome: Progressing   Problem: Activity: Goal: Ability to tolerate increased activity will improve Outcome:  Progressing   Problem: Respiratory: Goal: Ability to maintain a clear airway and adequate ventilation will improve Outcome: Progressing   Problem: Role Relationship: Goal: Method of communication will improve Outcome: Progressing

## 2024-05-23 DIAGNOSIS — Z87820 Personal history of traumatic brain injury: Secondary | ICD-10-CM | POA: Diagnosis not present

## 2024-05-23 MED ORDER — GABAPENTIN 250 MG/5ML PO SOLN: 300.0000 mg | Freq: Three times a day (TID) | ORAL | Status: AC

## 2024-05-23 MED ORDER — FREE WATER: 100.0000 mL | Status: AC

## 2024-05-23 MED ORDER — JEVITY 1.5 CAL/FIBER PO LIQD: 1000.0000 mL | ORAL | Status: AC

## 2024-05-23 MED ORDER — VALPROIC ACID 250 MG/5ML PO SOLN: 500.0000 mg | Freq: Every day | ORAL | Status: AC

## 2024-05-23 MED ORDER — FAMOTIDINE 40 MG/5ML PO SUSR: 20.0000 mg | Freq: Every day | ORAL | Status: AC

## 2024-05-23 MED ORDER — VALPROIC ACID 250 MG/5ML PO SOLN: 750.0000 mg | Freq: Every day | ORAL | Status: AC

## 2024-05-23 MED ORDER — METOPROLOL TARTRATE 25 MG PO TABS: 12.5000 mg | ORAL_TABLET | Freq: Two times a day (BID) | ORAL | Status: AC

## 2024-05-23 MED ORDER — QUETIAPINE FUMARATE 50 MG PO TABS: 50.0000 mg | ORAL_TABLET | Freq: Three times a day (TID) | ORAL | Status: AC

## 2024-05-23 MED ORDER — LACOSAMIDE 100 MG PO TABS: 100.0000 mg | ORAL_TABLET | Freq: Two times a day (BID) | ORAL | Status: AC

## 2024-05-23 MED ORDER — BACLOFEN 10 MG PO TABS: 10.0000 mg | ORAL_TABLET | Freq: Three times a day (TID) | ORAL | Status: AC

## 2024-05-23 MED ORDER — DIAZEPAM (15 MG DOSE) 2 X 7.5 MG/0.1ML NA LQPK: NASAL | 0 refills | Status: AC

## 2024-05-23 MED ORDER — PROSOURCE TF20 ENFIT COMPATIBL EN LIQD: 60.0000 mL | Freq: Every day | ENTERAL | Status: AC

## 2024-05-23 NOTE — Progress Notes (Addendum)
 Brief Nutrition Follow-Up Note  Case discussed with RN, MD, and TOC. Pt to discharge to [facility] today. Confirmed feeding regimen:  -TF via PEG:   Jevity 1.5 @ 65 ml/hr   ProSource TF 20- Give 60ml daily via tube, each supplement provides 80kcal and 20g of protein.    Free water  flushes 100ml q4 hours    Regimen provides 2420kcal/day, 120g/day protein, 33g/day fiber and 1726ml/day of free water .   Per TOC notes, facility requesting 3 days of TF formula. RD delivered to pt room. RN aware.   Margery ORN, RD, LDN, CDCES Registered Dietitian III Certified Diabetes Care and Education Specialist If unable to reach this RD, please use RD Inpatient group chat on secure chat between hours of 8am-4 pm daily

## 2024-05-23 NOTE — NC FL2 (Signed)
 [place]  MEDICAID FL2 LEVEL OF CARE FORM     IDENTIFICATION  Patient Name: Alex [name] Birthdate: 1995-10-24 Sex: male Admission Date (Current Location): 11/19/2023  [place] and [place] Number:  [name] [id number] Facility and Address:  [facility], [address] [place], [place] [address]      Provider Number: [id number]  Attending Physician Name and Address:  Jens Durand, MD  Relative Name and Phone Number:  Suzen Spurr  Mother  Emergency Contact  [phone]  Legal guardian    Current Level of Care: Hospital Recommended Level of Care: Skilled Nursing Facility Prior Approval Number:    Date Approved/Denied:   PASRR Number: [id number]  Discharge Plan: SNF    Current Diagnoses: Patient Active Problem List   Diagnosis Date Noted   Tracheostomy dependence (HCC) 05/16/2024   Aspiration pneumonia of right lung (HCC) 02/17/2024   Sepsis (HCC) 02/17/2024   Anemia, unspecified 01/08/2024   History of traumatic brain injury 12/20/2023   Hypotension 12/19/2023   Pressure injury of skin 11/26/2023   Acute hypoxic respiratory failure (HCC) 11/20/2023   Seizure disorder (HCC) 11/20/2023   Seizure (HCC) 09/11/2021   Status epilepticus (HCC) 05/31/2021   Traumatic brain injury (HCC) 05/31/2021   Malnutrition of moderate degree 05/31/2021    Orientation RESPIRATION BLADDER Height & Weight      (TBI)  Normal (Trach removed) Incontinent Weight: 68.4 kg Height:  5' 9.02 (175.3 cm)  BEHAVIORAL SYMPTOMS/MOOD NEUROLOGICAL BOWEL NUTRITION STATUS  Other (Comment) (TBI)  (TBI pt takesValporic Acid for seizures) Incontinent Feeding tube  AMBULATORY STATUS COMMUNICATION OF NEEDS Skin   Total Care Does not communicate (R/T TBI) Normal PU Stage 1 Dressing: No Dressing                     Personal Care Assistance Level of Assistance  Total care       Total Care Assistance: Maximum assistance   Functional Limitations Info   Sight, Hearing, Speech Sight Info: Adequate Hearing Info: Adequate Speech Info: Impaired (TBI)    SPECIAL CARE FACTORS FREQUENCY                       Contractures Contractures Info: Present (ALl extremities)    Additional Factors Info  Code Status Code Status Info: Full Allergies Info: NKDA     Isolation Precautions Info: N/A     Current Medications (05/23/2024):  This is the current hospital active medication list Current Facility-Administered Medications  Medication Dose Route Frequency Provider Last Rate Last Admin   acetaminophen  (TYLENOL ) tablet 650 mg  650 mg Per Tube Q6H PRN Rust-Chester, Britton L, NP   650 mg at 05/17/24 2116   Or   acetaminophen  (TYLENOL ) suppository 650 mg  650 mg Rectal Q6H PRN Rust-Chester, Britton L, NP   650 mg at 11/21/23 0230   baclofen  (LIORESAL ) tablet 10 mg  10 mg Per Tube TID Rust-Chester, Jenita CROME, NP   10 mg at 05/23/24 1029   bisacodyl  (DULCOLAX) suppository 10 mg  10 mg Rectal Daily PRN Assaker, Darrin, MD   10 mg at 12/06/23 1054   diltiazem  (CARDIZEM ) tablet 30 mg  30 mg Per Tube Q6H PRN Von Bellis, MD       enoxaparin  (LOVENOX ) injection 40 mg  40 mg Subcutaneous Q24H Nelson, Dana G, NP   40 mg at 05/22/24 2121   famotidine  (PEPCID ) 40 MG/5ML suspension 20 mg  20 mg Per Tube BID Josette Ade, MD  20 mg at 05/23/24 1028   feeding supplement (JEVITY 1.5 CAL/FIBER) liquid 1,000 mL  1,000 mL Per Tube Continuous Dgayli, Khabib, MD 65 mL/hr at 05/21/24 1854 Infusion Verify at 05/21/24 1854   feeding supplement (PROSource TF20) liquid 60 mL  60 mL Per Tube Daily Isadora Hose, MD   60 mL at 05/23/24 1030   free water  100 mL  100 mL Per Tube Q4H Trudy Anthony HERO, MD   100 mL at 05/23/24 1200   gabapentin  (NEURONTIN ) 250 MG/5ML solution 300 mg  300 mg Per Tube Q8H Chappell, Alex B, RPH   300 mg at 05/23/24 9450   Gerhardt's butt cream   Topical TID Amin, Sumayya, MD   Given at 05/23/24 1030   glycopyrrolate  (ROBINUL )  injection 0.2 mg  0.2 mg Intravenous QID PRN Kasa, Kurian, MD   0.2 mg at 12/19/23 1205   lacosamide  (VIMPAT ) tablet 100 mg  100 mg Per Tube BID Grubb, Rodney D, RPH   100 mg at 05/23/24 1029   levalbuterol  (XOPENEX ) nebulizer solution 0.63 mg  0.63 mg Nebulization Q6H PRN Kasa, Kurian, MD   0.63 mg at 03/13/24 1105   lip balm (BLISTEX) ointment   Topical PRN Kathrene Almarie Bake, NP   Given at 04/12/24 2212   loperamide  (IMODIUM ) capsule 2 mg  2 mg Oral Q8H PRN Josette Ade, MD   2 mg at 05/17/24 2117   LORazepam  (ATIVAN ) 2 MG/ML concentrated solution 2 mg  2 mg Per Tube Q6H PRN Von Bellis, MD   2 mg at 05/01/24 1047   metoprolol  tartrate (LOPRESSOR ) tablet 12.5 mg  12.5 mg Per Tube BID Josette Ade, MD   12.5 mg at 05/23/24 1029   morphine  (PF) 2 MG/ML injection 2 mg  2 mg Subcutaneous Q4H PRN Jesus America, NP   2 mg at 02/01/24 0757   ondansetron  (ZOFRAN ) tablet 4 mg  4 mg Per Tube Q6H PRN Rust-Chester, Jenita CROME, NP       Or   ondansetron  (ZOFRAN ) injection 4 mg  4 mg Intravenous Q6H PRN Rust-Chester, Jenita CROME, NP       Oral care mouth rinse  15 mL Mouth Rinse PRN Caleen Qualia, MD   15 mL at 05/12/24 0313   oxyCODONE  (Oxy IR/ROXICODONE ) immediate release tablet 5 mg  5 mg Per Tube Q6H PRN Sreeram, Narendranath, MD   5 mg at 05/01/24 0845   QUEtiapine  (SEROQUEL ) tablet 50 mg  50 mg Per Tube TID Kasa, Kurian, MD   50 mg at 05/23/24 1029   valproic  acid (DEPAKENE ) 250 MG/5ML solution 500 mg  500 mg Per Tube Daily Nada Adriana BIRCH, RPH   500 mg at 05/23/24 1028   valproic  acid (DEPAKENE ) 250 MG/5ML solution 750 mg  750 mg Per Tube QHS Nada Adriana BIRCH, RPH   750 mg at 05/22/24 2121     Discharge Medications: Please see discharge summary for a list of discharge medications.  Relevant Imaging Results:  Relevant Lab Results:   Additional Information SS#: [id number]  Marinda Cooks, RN

## 2024-05-23 NOTE — Progress Notes (Signed)
 Patient to discharge to SNF. Transport being arranged per case Production designer, theatre/television/film. TF formula x 3 to be sent with patient per RD. Report called to [facility]. Belongings packed and AVS printed for transfer packet. No IV to remove. Patient unable to ask questions or voice concerns. Patient clean and dry. Shows no sign of discomfort.

## 2024-05-23 NOTE — Plan of Care (Signed)
  Problem: Fluid Volume: Goal: Hemodynamic stability will improve Outcome: Progressing   Problem: Clinical Measurements: Goal: Signs and symptoms of infection will decrease Outcome: Progressing   Problem: Respiratory: Goal: Ability to maintain adequate ventilation will improve Outcome: Progressing   Problem: Activity: Goal: Ability to tolerate increased activity will improve Outcome: Progressing   Problem: Respiratory: Goal: Ability to maintain a clear airway and adequate ventilation will improve Outcome: Progressing   Problem: Role Relationship: Goal: Method of communication will improve Outcome: Progressing   Problem: Clinical Measurements: Goal: Ability to maintain clinical measurements within normal limits will improve Outcome: Progressing Goal: Will remain free from infection Outcome: Progressing Goal: Diagnostic test results will improve Outcome: Progressing Goal: Respiratory complications will improve Outcome: Progressing Goal: Cardiovascular complication will be avoided Outcome: Progressing   Problem: Activity: Goal: Risk for activity intolerance will decrease Outcome: Progressing   Problem: Nutrition: Goal: Adequate nutrition will be maintained Outcome: Progressing   Problem: Coping: Goal: Level of anxiety will decrease Outcome: Progressing   Problem: Elimination: Goal: Will not experience complications related to bowel motility Outcome: Progressing Goal: Will not experience complications related to urinary retention Outcome: Progressing   Problem: Pain Managment: Goal: General experience of comfort will improve and/or be controlled Outcome: Progressing   Problem: Safety: Goal: Ability to remain free from injury will improve Outcome: Progressing   Problem: Skin Integrity: Goal: Risk for impaired skin integrity will decrease Outcome: Progressing   Problem: Activity: Goal: Ability to tolerate increased activity will improve Outcome:  Progressing   Problem: Respiratory: Goal: Ability to maintain a clear airway and adequate ventilation will improve Outcome: Progressing   Problem: Role Relationship: Goal: Method of communication will improve Outcome: Progressing
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
## 2024-05-26 ENCOUNTER — Emergency Department
Admission: EM | Admit: 2024-05-26 | Discharge: 2024-05-26 | Disposition: A | Discharge status: Home / Self Care | Source: Skilled Nursing Facility | Attending: Emergency Medicine | Admitting: Emergency Medicine

## 2024-05-26 ENCOUNTER — Other Ambulatory Visit: Payer: Self-pay

## 2024-05-26 ENCOUNTER — Emergency Department

## 2024-05-26 DIAGNOSIS — K9423 Gastrostomy malfunction: Secondary | ICD-10-CM | POA: Diagnosis present

## 2024-05-26 NOTE — ED Notes (Signed)
 Called to [facility] per Garment/textile technologist @ 422am/[facility]/Rep [place].

## 2024-05-26 NOTE — ED Notes (Signed)
 Pt G tube was unclogged and a dressing was placed under same to avoid further skin irration.  The Pt's abdomen and left  side of his body was noted to be red and have yellow discoloration. MD saw same while unclogging
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
## 2024-05-26 NOTE — ED Triage Notes (Signed)
 Pt is coming in for [facility] care with medic. He is sent in for potential G-tube displacement. The care staff there states they do not know how long it has not been working. The staff member states the fluid/meds being pushed started pushing out from around the insertion site. There is some redness some redness around the insertion site.

## 2024-05-26 NOTE — Discharge Instructions (Addendum)
 Patient's G-tube was flushed successfully in the ED and is functioning properly.  Placement confirmed with x-ray.

## 2024-06-03 ENCOUNTER — Other Ambulatory Visit: Payer: Self-pay

## 2024-06-03 ENCOUNTER — Emergency Department
Admission: EM | Admit: 2024-06-03 | Discharge: 2024-06-04 | Disposition: A | Discharge status: Home / Self Care | Attending: Emergency Medicine | Admitting: Emergency Medicine

## 2024-06-03 ENCOUNTER — Encounter: Payer: Self-pay | Admitting: Emergency Medicine

## 2024-06-03 ENCOUNTER — Emergency Department

## 2024-06-03 DIAGNOSIS — R569 Unspecified convulsions: Secondary | ICD-10-CM | POA: Insufficient documentation

## 2024-06-03 LAB — CBC WITH DIFFERENTIAL/PLATELET
Abs Immature Granulocytes: 0.01 K/uL (ref 0.00–0.07)
Basophils Absolute: 0 K/uL (ref 0.0–0.1)
Basophils Relative: 0 %
Eosinophils Absolute: 0.1 K/uL (ref 0.0–0.5)
Eosinophils Relative: 3 %
HCT: 44.2 % (ref 39.0–52.0)
Hemoglobin: 14 g/dL (ref 13.0–17.0)
Immature Granulocytes: 0 %
Lymphocytes Relative: 16 %
Lymphs Abs: 0.7 K/uL (ref 0.7–4.0)
MCH: 29.1 pg (ref 26.0–34.0)
MCHC: 31.7 g/dL (ref 30.0–36.0)
MCV: 91.9 fL (ref 80.0–100.0)
Monocytes Absolute: 0.4 K/uL (ref 0.1–1.0)
Monocytes Relative: 9 %
Neutro Abs: 3 K/uL (ref 1.7–7.7)
Neutrophils Relative %: 72 %
Platelets: 259 K/uL (ref 150–400)
RBC: 4.81 MIL/uL (ref 4.22–5.81)
RDW: 13.2 % (ref 11.5–15.5)
WBC: 4.1 K/uL (ref 4.0–10.5)
nRBC: 0 % (ref 0.0–0.2)

## 2024-06-03 LAB — URINALYSIS, W/ REFLEX TO CULTURE (INFECTION SUSPECTED)
Bilirubin Urine: NEGATIVE
Glucose, UA: NEGATIVE mg/dL
Hgb urine dipstick: NEGATIVE
Ketones, ur: NEGATIVE mg/dL
Leukocytes,Ua: NEGATIVE
Nitrite: NEGATIVE
Protein, ur: NEGATIVE mg/dL
Specific Gravity, Urine: 1.008 (ref 1.005–1.030)
pH: 9 — ABNORMAL HIGH (ref 5.0–8.0)

## 2024-06-03 LAB — COMPREHENSIVE METABOLIC PANEL WITH GFR
ALT: 40 U/L (ref 0–44)
AST: 35 U/L (ref 15–41)
Albumin: 3.3 g/dL — ABNORMAL LOW (ref 3.5–5.0)
Alkaline Phosphatase: 100 U/L (ref 38–126)
Anion gap: 11 (ref 5–15)
BUN: 12 mg/dL (ref 6–20)
CO2: 24 mmol/L (ref 22–32)
Calcium: 9.1 mg/dL (ref 8.9–10.3)
Chloride: 104 mmol/L (ref 98–111)
Creatinine, Ser: 0.54 mg/dL — ABNORMAL LOW (ref 0.61–1.24)
GFR, Estimated: >60 mL/min (ref >60)
Glucose, Bld: 101 mg/dL — ABNORMAL HIGH (ref 70–99)
Potassium: 4.5 mmol/L (ref 3.5–5.1)
Sodium: 139 mmol/L (ref 135–145)
Total Bilirubin: 1 mg/dL (ref 0.0–1.2)
Total Protein: 7.2 g/dL (ref 6.5–8.1)

## 2024-06-03 MED ORDER — LACTATED RINGERS IV BOLUS
1000.0000 mL | Freq: Once | INTRAVENOUS | Status: AC
  Administered 2024-06-03 (×2): 1000 mL via INTRAVENOUS

## 2024-06-03 NOTE — ED Notes (Signed)
 Alex [name] (mother) updated on patients status.   [phone]

## 2024-06-03 NOTE — Discharge Instructions (Addendum)
 Please return if you have any fever, seizure, altered mental status, new weakness or numbness, new speech difficulty, or if you have any additional concerns.

## 2024-06-03 NOTE — ED Triage Notes (Signed)
 Patient arrives via EMS from [facility] for seizures. Facility reports two seizures today lasting approx 45 seconds each that were five minutes apart. Hx of TBI - non verbal at baseline. 2mg  ativan  given by facility.

## 2024-06-03 NOTE — ED Provider Notes (Signed)
 [name] Provider Note    Event Date/Time   First MD Initiated Contact with Patient 06/03/24 1923     (approximate)   History   Seizures   HPI  Alex [name] is a 28 y.o. male with history of TBI, seizure disorder, presenting for seizure.  Per independent history from EMS, patient had 2 seizures lasting approximately 4 to 5 seconds each, seizures were about 5 minutes apart.  He is nonverbal at baseline, was given 2 mg of Ativan  by facility with resolution of the seizure.  No reported new trauma or falls.  No reported fevers.   Independent history obtained from EMS as above.  On independent chart review, he was admitted in late July for altered mental status, he is nonverbal at baseline but well developed sporadic nonsensical words, has chronic left-sided paralysis.  Is able to move his right upper extremity in order to feed himself but he has some involuntary movements with that arm.  He has some baseline function to his right lower extremity.  During an admission in January, he was admitted to ICU for respiratory failure requiring intubation secondary to aspiration.  Underwent trach in February.  Physical Exam   Triage Vital Signs: ED Triage Vitals  Encounter Vitals Group     BP 06/03/24 1925 135/86     Girls Systolic BP Percentile --      Girls Diastolic BP Percentile --      Boys Systolic BP Percentile --      Boys Diastolic BP Percentile --      Pulse Rate 06/03/24 1925 (!) 123     Resp 06/03/24 1925 20     Temp 06/03/24 1925 98.9 F (37.2 C)     Temp Source 06/03/24 1925 Axillary     SpO2 06/03/24 1925 97 %     Weight 06/03/24 1926 150 lb 12.7 oz (68.4 kg)     Height 06/03/24 1926 5' 9 (1.753 m)     Head Circumference --      Peak Flow --      Pain Score --      Pain Loc --      Pain Education --      Exclude from Growth Chart --     Most recent vital signs: Vitals:   06/03/24 2100 06/03/24 2135  BP:  (!) 152/129  Pulse: (!)  103 (!) 117  Resp: (!) 23 (!) 32  Temp:    SpO2: 99% 100%     General: Awake, no distress.  CV:  Good peripheral perfusion.  Resp:  Normal effort.  No tachypnea or respiratory distress, his prior trach site is clean and dry without purulent drainage or bleeding, no overlying erythema Abd:  No distention.  Soft nontender, G-tube is in place Other:  He is able to equal and reactive, no obvious facial droop, when asking him questions, he grunts and nods, is able to move his right upper and lower extremity slightly, his left upper extremity is contracted in his left lower extremity is extended.     ED Results / Procedures / Treatments   Labs (all labs ordered are listed, but only abnormal results are displayed) Labs Reviewed  COMPREHENSIVE METABOLIC PANEL WITH GFR - Abnormal; Notable for the following components:      Result Value   Glucose, Bld 101 (*)    Creatinine, Ser 0.54 (*)    Albumin  3.3 (*)    All other components within normal  limits  URINALYSIS, W/ REFLEX TO CULTURE (INFECTION SUSPECTED) - Abnormal; Notable for the following components:   Color, Urine YELLOW (*)    APPearance CLEAR (*)    pH 9.0 (*)    Bacteria, UA RARE (*)    All other components within normal limits  CBC WITH DIFFERENTIAL/PLATELET     EKG  EKG shows, sinus tachycardia, rate 124, normal QRS, normal QTc, no obvious ischemic ST elevation, T wave flattening in aVL, T wave flattening is new compared to prior   RADIOLOGY On my independent interpretation, CT head without obvious intracranial hemorrhage   PROCEDURES:  Critical Care performed: No  Procedures   MEDICATIONS ORDERED IN ED: Medications  lactated ringers  bolus 1,000 mL (0 mLs Intravenous Stopped 06/03/24 2143)     IMPRESSION / MDM / ASSESSMENT AND PLAN / ED COURSE  I reviewed the triage vital signs and the nursing notes.                              Differential diagnosis includes, but is not limited to, breakthrough seizure,  electrolyte derangements, dehydration, occult infection.  Will get labs, UA, chest x-ray, CT head.  IV fluids.  Patient's presentation is most consistent with acute presentation with potential threat to life or bodily function.  Independent interpretation of labs and imaging below.  Clinical course as below.  Considered but no indication for inpatient admission at this time, he is safe for outpatient management.  Will discharge back to facility with instructions to follow-up with primary care doctor and neurologist for reassessment.  Discharge with strict return precautions.  The patient is on the cardiac monitor to evaluate for evidence of arrhythmia and/or significant heart rate changes.   Clinical Course as of 06/03/24 2223  Tue Jun 03, 2024  2040 DG Chest 1 View No active disease.  [TT]  2040 CT Head Wo Contrast IMPRESSION: 1. No acute intracranial abnormality. 2. Stable, age advanced cerebral atrophy and microvascular disease changes of the supratentorial brain. 3. Stable areas of encephalomalacia within the bilateral frontal lobes, bilateral temporal lobes and left parietal lobe.   [TT]  2040 Independent review of labs, no leukocytosis, electrolytes not severely deranged, LFTs are not elevated. [TT]  2157 Urinalysis, w/ Reflex to Culture (Infection Suspected) -Urine, Clean Catch(!) Not consistent with UTI. [TT]  2221 Patient was observed in the emergency department, no additional seizures here.  Heart rate is low 100.  Nurse had updated mom about the plan and she is agreeable for discharge back to facility. [TT]    Clinical Course User Index [TT] Waymond, Lorelle Cummins, MD     FINAL CLINICAL IMPRESSION(S) / ED DIAGNOSES   Final diagnoses:  Seizure (HCC)     Rx / DC Orders   ED Discharge Orders     None        Note:  This document was prepared using Dragon voice recognition software and may include unintentional dictation errors.    Waymond Lorelle Cummins, MD 06/03/24 2223

## 2024-06-17 ENCOUNTER — Emergency Department
Admission: EM | Admit: 2024-06-17 | Discharge: 2024-06-17 | Disposition: A | Discharge status: Home / Self Care | Attending: Emergency Medicine | Admitting: Emergency Medicine

## 2024-06-17 ENCOUNTER — Other Ambulatory Visit: Payer: Self-pay

## 2024-06-17 DIAGNOSIS — G40909 Epilepsy, unspecified, not intractable, without status epilepticus: Secondary | ICD-10-CM | POA: Insufficient documentation

## 2024-06-17 DIAGNOSIS — K9420 Gastrostomy complication, unspecified: Secondary | ICD-10-CM | POA: Insufficient documentation

## 2024-06-17 MED ORDER — MIDAZOLAM 5 MG/ML ADULT INJ FOR INTRANASAL USE (MC USE ONLY): 2.5000 mg | Freq: Once | INTRAMUSCULAR | Status: DC

## 2024-06-17 NOTE — ED Notes (Signed)
 Called [facility] for transport to [address]  spoke to Ed  will transport when truck available

## 2024-06-17 NOTE — ED Triage Notes (Signed)
 Pt BIB EMS from [facility] due to G-tube being pulled out. Facility unsure how long g-tube had been pulled out.

## 2024-06-17 NOTE — Discharge Instructions (Signed)
 We placed a new 14-french PEG tube. Please resume medications and feeding as usual. Follow up with your doctor.

## 2024-06-17 NOTE — ED Provider Notes (Signed)
 [facility] Provider Note    Event Date/Time   First MD Initiated Contact with Patient 06/17/24 1525     (approximate)   History   Chief Complaint: G-Tube Issue   HPI  Alex [name] is a 28 y.o. male with a history of TBI, seizure disorder, PEG tube dependent, previous tracheostomy who was sent to the ED from [facility] SNF due to his G-tube inadvertently removed.  No other reported complaints.  Patient denies pain or trouble breathing.        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   Endotracheally intubated 05/31/2021   Seizure disorder (HCC)    TBI (traumatic brain injury) [facility])     Current Outpatient Rx   Order #: [id number] Class: No Print   Order #: [id number] Class: Print   Order #: [id number] Class: No Print   Order #: [id number] Class: No Print   Order #: [id number] Class: No Print   Order #: [id number] Class: No Print   Order #: [id number] Class: No Print   Order #: [id number] Class: No Print   Order #: [id number] Class: No Print   Order #: [id number] Class: No Print   Order #: [id number] Class: No Print   Order #: [id number] Class: No Print    Past Surgical History:  Procedure Laterality Date   GASTROSTOMY TUBE PLACEMENT     IR GASTROSTOMY TUBE MOD SED  12/05/2023   TRACHEOSTOMY TUBE PLACEMENT N/A 12/07/2023   Procedure: TRACHEOSTOMY;  Surgeon: Rumalda Massie RAMAN, MD;  Location: [facility] ORS;  Service: ENT;  Laterality: N/A;    Physical Exam   Triage Vital Signs: ED Triage Vitals  Encounter Vitals Group     BP      Girls Systolic BP Percentile      Girls Diastolic BP Percentile      Boys Systolic BP Percentile      Boys Diastolic BP Percentile      Pulse      Resp      Temp      Temp src      SpO2      Weight      Height      Head Circumference      Peak Flow      Pain Score      Pain Loc      Pain Education      Exclude from Growth Chart     Most recent  vital signs: Vitals:   06/17/24 1532  BP: 104/68  Pulse: 70  Resp: 17  Temp: (!) 97.5 F (36.4 C)  SpO2: 100%    General: Awake, no distress.  CV:  Good peripheral perfusion.  Regular rate rhythm Resp:  Normal effort.  Clear to auscultation bilaterally Abd:  No distention.  Soft nontender.  PEG tube site with some ostomy hypertrophy, noninflamed, no purulent drainage. Other:  Dry lips, moist oral mucosa   ED Results / Procedures / Treatments   Labs (all labs ordered are listed, but only abnormal results are displayed) Labs Reviewed - No data to display   EKG    RADIOLOGY    PROCEDURES:  .Gastrostomy tube replacement  Date/Time: 06/17/2024 3:56 PM  Performed by: Viviann Pastor, MD Authorized by: Viviann Pastor, MD  Consent: The procedure was performed in an emergent situation Patient identity confirmed: arm band Time out: Immediately prior to procedure a time out was called to verify the correct patient, procedure, equipment, support staff and site/side  marked as required. Preparation: Patient was prepped and draped in the usual sterile fashion. Local anesthesia used: no  Anesthesia: Local anesthesia used: no  Sedation: Patient sedated: no  Patient tolerance: patient tolerated the procedure well with no immediate complications Comments: place a new 14 French PEG tube uneventfully.  Balloon inflated with 5 mL saline.  Bumper secured, dressing applied.  Return of gastric secretions through the port.      MEDICATIONS ORDERED IN ED: Medications - No data to display   IMPRESSION / MDM / ASSESSMENT AND PLAN / ED COURSE  I reviewed the triage vital signs and the nursing notes.   Patient's presentation is most consistent with exacerbation of chronic illness.  Patient sent to the ED due to removal of his PEG tube.  He is calm, nontoxic, reassuring exam and vital signs.  No report or records from the SNF to indicate the size of the feeding tube.  In  EMR I see that he had a 14 French PEG tube placed in 2022.  At the bedside I was able to place a new 14 French PEG tube uneventfully.  Balloon inflated with 5 mL saline.  Bumper secured, dressing applied.  Return of gastric secretions through the port.  Pulmonary exam reassuring, no evidence of aspiration.       FINAL CLINICAL IMPRESSION(S) / ED DIAGNOSES   Final diagnoses:  Complication of feeding tube [facility])  Seizure disorder (HCC)     Rx / DC Orders   ED Discharge Orders     None        Note:  This document was prepared using Dragon voice recognition software and may include unintentional dictation errors.   Viviann Pastor, MD 06/17/24 [phone]

## 2024-08-10 ENCOUNTER — Encounter: Payer: Self-pay | Admitting: Emergency Medicine

## 2024-08-10 ENCOUNTER — Other Ambulatory Visit: Payer: Self-pay

## 2024-08-10 ENCOUNTER — Emergency Department
Admission: EM | Admit: 2024-08-10 | Discharge: 2024-08-10 | Disposition: A | Discharge status: Skilled Nursing Facility | Attending: Emergency Medicine | Admitting: Emergency Medicine

## 2024-08-10 DIAGNOSIS — K9420 Gastrostomy complication, unspecified: Secondary | ICD-10-CM | POA: Diagnosis present

## 2024-08-10 NOTE — ED Notes (Signed)
 Fall risk bundle is currently in place.

## 2024-08-10 NOTE — ED Notes (Signed)
 Per [facility] it will be 12:30

## 2024-08-10 NOTE — ED Triage Notes (Signed)
 Pt pulled G-tube out, needs replaced.

## 2024-08-10 NOTE — ED Triage Notes (Addendum)
 FIRST NURSE NOTE:  Pt arrived via [facility] from [facility] G-tube replacement, per staff pt can be combative at times and grabbed his G-tube out.   113/67 P85 RR14 98% RA

## 2024-08-10 NOTE — ED Notes (Addendum)
 Spoke with legal guardians sister who reported the number on file for legal guardian (shannon) is the correct number but she is at work at the moment. Relayed to patients aunt that legal guardian can call back this number and we can answer any questions she has. Also, patient is being discharged back to facility at this time

## 2024-08-10 NOTE — ED Provider Notes (Signed)
 [facility] Provider Note    Event Date/Time   First MD Initiated Contact with Patient 08/10/24 [phone]     (approximate)   History   G-tube dislodgement   HPI  Alex [name] is a 28 y.o. male who presents to the emergency department today because his G-tube is no longer in place.  Apparently the patient will occasionally pull his G-tube out and per chart review he has been seen in the emergency department for G-tube dislodgment.  On exam patient is unable to give any history.     Physical Exam   Triage Vital Signs: ED Triage Vitals  Encounter Vitals Group     BP 08/10/24 0630 (!) 121/94     Girls Systolic BP Percentile --      Girls Diastolic BP Percentile --      Boys Systolic BP Percentile --      Boys Diastolic BP Percentile --      Pulse Rate 08/10/24 0630 81     Resp --      Temp 08/10/24 0630 98.5 F (36.9 C)     Temp Source 08/10/24 0630 Oral     SpO2 08/10/24 0630 97 %     Weight 08/10/24 0647 149 lb 14.6 oz (68 kg)     Height 08/10/24 0647 5' 9 (1.753 m)     Head Circumference --      Peak Flow --      Pain Score --      Pain Loc --      Pain Education --      Exclude from Growth Chart --     Most recent vital signs: Vitals:   08/10/24 0630 08/10/24 0710  BP: (!) 121/94 (!) 101/54  Pulse: 81 73  Temp: 98.5 F (36.9 C)   SpO2: 97% 99%   General: Awake, alert CV:  Good peripheral perfusion.  Resp:  Normal effort.  Abd:  No distention. G-tube site noted.   ED Results / Procedures / Treatments   Labs (all labs ordered are listed, but only abnormal results are displayed) Labs Reviewed - No data to display   EKG  None   RADIOLOGY None   PROCEDURES:  Critical Care performed: No  FEEDING TUBE REPLACEMENT  Date/Time: 08/10/2024 8:24 AM  Performed by: Floy Roberts, MD Authorized by: Floy Roberts, MD  Consent: The procedure was performed in an emergent situation Indications: tube dislodged Local  anesthesia used: no  Anesthesia: Local anesthesia used: no  Sedation: Patient sedated: no  Tube type: gastrostomy Patient position: supine Tube size: 14 Fr Bulb inflation fluid: normal saline Placement/position confirmation: gastric contents aspirated Tube placement difficulty: none Patient tolerance: patient tolerated the procedure well with no immediate complications       MEDICATIONS ORDERED IN ED: Medications - No data to display   IMPRESSION / MDM / ASSESSMENT AND PLAN / ED COURSE  I reviewed the triage vital signs and the nursing notes.                              Differential diagnosis includes, but is not limited to, g-tube displacement  Patient's presentation is most consistent with acute presentation with potential threat to life or bodily function.   Patient presented to the emergency department today after his G-tube became dislodged.  Will attempt bedside replacement.  Patient tolerated bedside replacement well.  Tube entered easily and balloon was inflated  with 5 mL of saline.  Gastric contents did start coming out.  Will have nursing staff dressed and will discharge back to facility.   FINAL CLINICAL IMPRESSION(S) / ED DIAGNOSES   Final diagnoses:  Complication of feeding tube [facility])      Note:  This document was prepared using Dragon voice recognition software and may include unintentional dictation errors.    Floy Roberts, MD 08/10/24 [phone]

## 2024-10-08 ENCOUNTER — Emergency Department

## 2024-10-08 ENCOUNTER — Encounter: Payer: Self-pay | Admitting: Emergency Medicine

## 2024-10-08 ENCOUNTER — Emergency Department
Admission: EM | Admit: 2024-10-08 | Discharge: 2024-10-08 | Disposition: A | Discharge status: Skilled Nursing Facility | Attending: Emergency Medicine | Admitting: Emergency Medicine

## 2024-10-08 ENCOUNTER — Other Ambulatory Visit: Payer: Self-pay

## 2024-10-08 DIAGNOSIS — T85528A Displacement of other gastrointestinal prosthetic devices, implants and grafts, initial encounter: Secondary | ICD-10-CM | POA: Insufficient documentation

## 2024-10-08 DIAGNOSIS — Y732 Prosthetic and other implants, materials and accessory gastroenterology and urology devices associated with adverse incidents: Secondary | ICD-10-CM | POA: Insufficient documentation

## 2024-10-08 MED ORDER — DIATRIZOATE MEGLUMINE & SODIUM 66-10 % PO SOLN
30.0000 mL | Freq: Once | ORAL | Status: AC
  Administered 2024-10-08: 07:00:00 30 mL

## 2024-10-08 MED ORDER — LIDOCAINE HCL URETHRAL/MUCOSAL 2 % EX GEL
1.0000 | Freq: Once | CUTANEOUS | Status: AC
  Administered 2024-10-08: 06:00:00 1 via TOPICAL
  Filled 2024-10-08: qty 10

## 2024-10-08 NOTE — ED Notes (Signed)
 [facility] called spoke with [place] regarding transfer to facility

## 2024-10-08 NOTE — ED Provider Notes (Signed)
 [facility] [address] Provider Note    Event Date/Time   First MD Initiated Contact with Patient 10/08/24 [phone]     (approximate)   History   G-tube Removed   HPI  Alex [name] is a 28 y.o. male with history of TBI, seizures, G-tube dependent who presents to the emergency department from [facility] after his G-tube became dislodged at some point over the course of the day.  Staff unable to tell us  when the G-tube was dislodged.   History provided by staff at [facility], EMS.    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 Authorizing Provider  baclofen  (LIORESAL ) 10 MG tablet Place 1 tablet (10 mg total) into feeding tube 3 (three) times daily. 05/23/24   Jens Durand, MD  diazePAM , 15 MG Dose, (VALTOCO  15 MG DOSE) 2 x 7.5 MG/0.1ML LQPK Spray 7.5 mg into each nostril in the event of a seizure 05/23/24   Jens Durand, MD  famotidine  (PEPCID ) 40 MG/5ML suspension Place 2.5 mLs (20 mg total) into feeding tube daily. 05/23/24   Jens Durand, MD  gabapentin  (NEURONTIN ) 250 MG/5ML solution Place 6 mLs (300 mg total) into feeding tube every 8 (eight) hours. 05/23/24   Jens Durand, MD  lacosamide  100 MG TABS Place 1 tablet (100 mg total) into feeding tube 2 (two) times daily. 05/23/24   Jens Durand, MD  metoprolol  tartrate (LOPRESSOR ) 25 MG tablet Place 0.5 tablets (12.5 mg total) into feeding tube 2 (two) times  daily. 05/23/24   Jens Durand, MD  Nutritional Supplements (FEEDING SUPPLEMENT, JEVITY 1.5 CAL/FIBER,) LIQD Place 1,000 mLs into feeding tube continuous. 05/23/24   Jens Durand, MD  Protein (FEEDING SUPPLEMENT, PROSOURCE TF20,) liquid Place 60 mLs into feeding tube daily. 05/24/24   Jens Durand, MD  QUEtiapine  (SEROQUEL ) 50 MG tablet Place 1 tablet (50 mg total) into feeding tube 3 (three) times daily. 05/23/24   Jens Durand, MD  valproic  acid (DEPAKENE ) 250 MG/5ML solution Place 10 mLs (500 mg total) into feeding tube daily. 05/24/24   Jens Durand, MD  valproic  acid (DEPAKENE ) 250 MG/5ML solution Place 15 mLs (750 mg total) into feeding tube at bedtime. 05/23/24   Jens Durand, MD  Water  For Irrigation, Sterile (FREE WATER ) SOLN Place 100 mLs into feeding tube every 4 (four) hours. 05/23/24   Jens Durand, MD    Physical Exam   Triage Vital Signs: ED Triage Vitals  Encounter Vitals Group     BP 10/08/24 0545 (!) 87/71     Girls Systolic BP Percentile --      Girls Diastolic BP Percentile --      Boys Systolic BP Percentile --      Boys Diastolic BP Percentile --      Pulse Rate 10/08/24 0545 77     Resp 10/08/24 0545 18     Temp 10/08/24 0545 (!) 97.2 F (36.2 C)  Temp Source 10/08/24 0545 Axillary     SpO2 10/08/24 0545 100 %     Weight 10/08/24 0543 149 lb 14.6 oz (68 kg)     Height 10/08/24 0543 5' 9 (1.753 m)     Head Circumference --      Peak Flow --      Pain Score 10/08/24 0543 0     Pain Loc --      Pain Education --      Exclude from Growth Chart --     Most recent vital signs: Vitals:   10/08/24 0545 10/08/24 0620  BP: (!) 87/71 113/75  Pulse: 77 72  Resp: 18 16  Temp: (!) 97.2 F (36.2 C)   SpO2: 100% 100%    CONSTITUTIONAL: Alert, awake, unable to answer questions or follow commands HEAD: Normocephalic, atraumatic EYES: Conjunctivae clear, pupils appear equal, sclera nonicteric ENT: normal nose, slightly dry appearing mucous membranes NECK:  Supple, normal ROM CARD: RRR; S1 and S2 appreciated RESP: Normal chest excursion without splinting or tachypnea; breath sounds clear and equal bilaterally; no wheezes, no rhonchi, no rales, no hypoxia or respiratory distress, speaking full sentences ABD/GI: Non-distended; soft, non-tender, no rebound, no guarding, no peritoneal signs, gastrostomy site has a large scab over top of that with no surrounding redness, warmth, bleeding SKIN: Normal color for age and race; warm; chronic erythematous rash noted to the left flank    ED Results / Procedures / Treatments   LABS: (all labs ordered are listed, but only abnormal results are displayed) Labs Reviewed - No data to display   EKG:  EKG Interpretation Date/Time:    Ventricular Rate:    PR Interval:    QRS Duration:    QT Interval:    QTC Calculation:   R Axis:      Text Interpretation:           RADIOLOGY: My personal review and interpretation of imaging: X-ray shows appropriate placement of G-tube.  I have personally reviewed all radiology reports.   DG Abdomen PEG Tube Location Result Date: 10/08/2024 EXAM: XR ABDOMEN INTRAOPERATIVE COUNT 1 VIEW(S) XRAY OF THE ABDOMEN 10/08/2024 06:52:51 AM COMPARISON: 05/26/2024 CLINICAL HISTORY: S/P gastrostomy tube (G tube) placement, follow-up exam FINDINGS: No unexpected retained surgical items. Percutaneous gastrostomy tube with balloon in place within the stomach. Contrast injection into the gastrostomy tube opacifies the gastric lumen. IMPRESSION: 1. Percutaneous gastrostomy tube with balloon in place within the stomach, with contrast injection opacifying the gastric lumen. Electronically signed by: Waddell Calk MD 10/08/2024 07:03 AM EST RP Workstation: [id number]     PROCEDURES:  Critical Care performed: No     .Gastrostomy tube replacement  Date/Time: 10/08/2024 6:52 AM  Performed by: Elbie Statzer, Josette SAILOR, DO Authorized by: Breshay Ilg, Josette SAILOR, DO  Consent: The procedure was  performed in an emergent situation Patient identity confirmed: arm band Time out: Immediately prior to procedure a time out was called to verify the correct patient, procedure, equipment, support staff and site/side marked as required. Preparation: Patient was prepped and draped in the usual sterile fashion. Local anesthesia used: yes Anesthesia: see MAR for details  Anesthesia: Local anesthesia used: yes Local Anesthetic: topical anesthetic  Sedation: Patient sedated: no  Patient tolerance: patient tolerated the procedure well with no immediate complications Comments: Patient tolerated procedure well.  Had to use       IMPRESSION / MDM / ASSESSMENT AND PLAN / ED COURSE  I reviewed the triage vital signs and the nursing notes.  Patient here after his G-tube was dislodged.  It appears it was last replaced in the emergency department with a 14 French.  The patient is on the cardiac monitor to evaluate for evidence of arrhythmia and/or significant heart rate changes.   DIFFERENTIAL DIAGNOSIS (includes but not limited to):   G-tube dislodgment, healing G-tube track   Patient's presentation is most consistent with acute complicated illness / injury requiring diagnostic workup.   PLAN: Patient here with G-tube dislodgment.  The track appears to be scabbed over as if the tube has not been out for several hours.  I was not able to initially replace with 14 French G-tube and had to use several Foley catheters to dilate the track to ultimately be able to replace the 14 French G-tube.  Gastric contents did come out but will obtain x-ray with Gastrografin  to ensure appropriate placement given difficulty with placing G-tube today.   MEDICATIONS GIVEN IN ED: Medications  lidocaine  (XYLOCAINE ) 2 % jelly 1 Application (1 Application Topical Given by Other 10/08/24 9379)  diatrizoate  meglumine -sodium (GASTROGRAFIN ) 66-10 % solution 30 mL (30 mLs Per Tube Given 10/08/24 0653)     ED  COURSE: X-ray reviewed and interpreted by myself and the radiologist and shows appropriate G-tube placement.  Will discharge back to [facility] healthcare.   At this time, I do not feel there is any life-threatening condition present. I reviewed all nursing notes, vitals, pertinent previous records.  All lab and urine results, EKGs, imaging ordered have been independently reviewed and interpreted by myself.  I reviewed all available radiology reports from any imaging ordered this visit.  Based on my assessment, I feel the patient is safe to be discharged home without further emergent workup and can continue workup as an outpatient as needed. Discussed all findings, treatment plan as well as usual and customary return precautions.  They verbalize understanding and are comfortable with this plan.  Outpatient follow-up has been provided as needed.  All questions have been answered.    CONSULTS:  none   OUTSIDE RECORDS REVIEWED: Reviewed last admission in January 2025.       FINAL CLINICAL IMPRESSION(S) / ED DIAGNOSES   Final diagnoses:  Dislodged gastrostomy tube     Rx / DC Orders   ED Discharge Orders     None        Note:  This document was prepared using Dragon voice recognition software and may include unintentional dictation errors.   Karolynn Infantino, Josette SAILOR, DO 10/08/24 [phone]

## 2024-10-08 NOTE — ED Triage Notes (Signed)
 Pt to ED via EMS from [facility] c/o g-tube being removed around 0430.  14 Fr tube placed last time this happened.  Altered at baseline.

## 2024-10-12 ENCOUNTER — Other Ambulatory Visit: Payer: Self-pay

## 2024-10-12 ENCOUNTER — Emergency Department
Admission: EM | Admit: 2024-10-12 | Discharge: 2024-10-12 | Disposition: A | Discharge status: Skilled Nursing Facility | Source: Skilled Nursing Facility | Attending: Emergency Medicine | Admitting: Emergency Medicine

## 2024-10-12 ENCOUNTER — Emergency Department

## 2024-10-12 DIAGNOSIS — T85528A Displacement of other gastrointestinal prosthetic devices, implants and grafts, initial encounter: Secondary | ICD-10-CM | POA: Insufficient documentation

## 2024-10-12 DIAGNOSIS — Y732 Prosthetic and other implants, materials and accessory gastroenterology and urology devices associated with adverse incidents: Secondary | ICD-10-CM | POA: Diagnosis not present

## 2024-10-12 MED ORDER — LORAZEPAM 2 MG/ML IJ SOLN
2.0000 mg | Freq: Once | INTRAMUSCULAR | Status: AC
  Administered 2024-10-12: 2 mg via INTRAMUSCULAR
  Filled 2024-10-12: qty 1

## 2024-10-12 NOTE — ED Provider Notes (Signed)
 "  [facility] Provider Note    Event Date/Time   First MD Initiated Contact with Patient 10/12/24 [phone]     (approximate)   History   G Tube Replacement   HPI  Alex [name] is a 28 y.o. male  who presents to the emergency department today because of concern for g-tube dislodgement. The patient has been seen in the emergency department previously for g-tube replacement, most recently 4 days ago. Per chart review they did have to dilate the tract to replace it.       Physical Exam   Triage Vital Signs: ED Triage Vitals [10/12/24 0738]  Encounter Vitals Group     BP      Girls Systolic BP Percentile      Girls Diastolic BP Percentile      Boys Systolic BP Percentile      Boys Diastolic BP Percentile      Pulse      Resp      Temp      Temp src      SpO2      Weight 149 lb 14.6 oz (68 kg)     Height      Head Circumference      Peak Flow      Pain Score 0     Pain Loc      Pain Education      Exclude from Growth Chart     Most recent vital signs: There were no vitals filed for this visit.  General: Awake, alert. CV:  Good peripheral perfusion.  Resp:  Normal effort.  Abd:  No distention.  Other:  Tract noted in upper left abdomen.   ED Results / Procedures / Treatments   Labs (all labs ordered are listed, but only abnormal results are displayed) Labs Reviewed - No data to display   EKG  None   RADIOLOGY I independently interpreted and visualized the KUB. My interpretation: Appropriate g-tube placement Radiology interpretation: IMPRESSION:  1. Gastrostomy tube appears to be in satisfactory position with enteric  contrast opacifying the stomach and proximal duodenum; no apparent  extravasation. The exact location could be more definitively assessed with the  CT of the abdomen if dislodgement of the gastrostomy tube is suspected.    PROCEDURES:  Critical Care performed: No  Gastrostomy tube replacement Performed by:  Guadalupe Eagles Consent: Verbal consent obtained. Risks and benefits: risks, benefits and alternatives were discussed Required items: required blood products, implants, devices, and special equipment available Patient identity confirmed: hospital-assigned identification number Time out: Immediately prior to procedure a time out was called to verify the correct patient, procedure, equipment, support staff and site/side marked as required. Preparation: Patient was prepped and draped in the usual sterile fashion. Patient tolerance: Patient tolerated the procedure well with no immediate complications.  Comments: 14 french Gastrostomy tube placed. Did require dilation starting with 10 french dilator.    MEDICATIONS ORDERED IN ED: Medications - No data to display   IMPRESSION / MDM / ASSESSMENT AND PLAN / ED COURSE  I reviewed the triage vital signs and the nursing notes.                              Differential diagnosis includes, but is not limited to, g-tub dislodgement  Patient's presentation is most consistent with acute presentation with potential threat to life or bodily function.   Patient presented to the  emergency department today because his G-tube became dislodged.  Initially tried replacing G-tube however it did require dilation.  Had to start with a 10 French dilator.  Patient did require some Ativan  given agitation to allow myself to safely perform the dilation and G-tube placement.  X-ray postplacement is appropriate.  Will discharge back to facility.      FINAL CLINICAL IMPRESSION(S) / ED DIAGNOSES   Final diagnoses:  Dislodged gastrostomy tube         Note:  This document was prepared using Dragon voice recognition software and may include unintentional dictation errors.    Floy Roberts, MD 10/12/24 1022  "

## 2024-10-12 NOTE — ED Triage Notes (Signed)
 Pt arrived via EMS from [facility] for G tube placement after removing it. Pt is altered at baseline.
